# Patient Record
Sex: Male | Born: 1935 | Race: White | Hispanic: No | Marital: Married | Smoking: Former smoker
Health system: Southern US, Community
[De-identification: ages and names within clinical notes are randomized; demographics above are authoritative.]

## PROBLEM LIST (undated history)

## (undated) DIAGNOSIS — T7840XA Allergy, unspecified, initial encounter: Secondary | ICD-10-CM

## (undated) DIAGNOSIS — Z7902 Long term (current) use of antithrombotics/antiplatelets: Secondary | ICD-10-CM

## (undated) DIAGNOSIS — K5790 Diverticulosis of intestine, part unspecified, without perforation or abscess without bleeding: Secondary | ICD-10-CM

## (undated) DIAGNOSIS — I712 Thoracic aortic aneurysm, without rupture: Secondary | ICD-10-CM

## (undated) DIAGNOSIS — I700 Atherosclerosis of aorta: Secondary | ICD-10-CM

## (undated) DIAGNOSIS — D649 Anemia, unspecified: Secondary | ICD-10-CM

## (undated) DIAGNOSIS — Z87442 Personal history of urinary calculi: Secondary | ICD-10-CM

## (undated) DIAGNOSIS — N400 Enlarged prostate without lower urinary tract symptoms: Secondary | ICD-10-CM

## (undated) DIAGNOSIS — K219 Gastro-esophageal reflux disease without esophagitis: Secondary | ICD-10-CM

## (undated) DIAGNOSIS — I10 Essential (primary) hypertension: Secondary | ICD-10-CM

## (undated) DIAGNOSIS — E119 Type 2 diabetes mellitus without complications: Secondary | ICD-10-CM

## (undated) DIAGNOSIS — I4892 Unspecified atrial flutter: Secondary | ICD-10-CM

## (undated) DIAGNOSIS — N189 Chronic kidney disease, unspecified: Secondary | ICD-10-CM

## (undated) DIAGNOSIS — I739 Peripheral vascular disease, unspecified: Secondary | ICD-10-CM

## (undated) DIAGNOSIS — N183 Chronic kidney disease, stage 3 unspecified: Secondary | ICD-10-CM

## (undated) DIAGNOSIS — K56609 Unspecified intestinal obstruction, unspecified as to partial versus complete obstruction: Secondary | ICD-10-CM

## (undated) DIAGNOSIS — M1990 Unspecified osteoarthritis, unspecified site: Secondary | ICD-10-CM

## (undated) DIAGNOSIS — I2510 Atherosclerotic heart disease of native coronary artery without angina pectoris: Secondary | ICD-10-CM

## (undated) DIAGNOSIS — E785 Hyperlipidemia, unspecified: Secondary | ICD-10-CM

## (undated) DIAGNOSIS — E041 Nontoxic single thyroid nodule: Secondary | ICD-10-CM

## (undated) DIAGNOSIS — I7121 Aneurysm of the ascending aorta, without rupture: Secondary | ICD-10-CM

## (undated) DIAGNOSIS — C679 Malignant neoplasm of bladder, unspecified: Secondary | ICD-10-CM

## (undated) HISTORY — DX: Peripheral vascular disease, unspecified: I73.9

## (undated) HISTORY — DX: Atherosclerotic heart disease of native coronary artery without angina pectoris: I25.10

## (undated) HISTORY — PX: VASECTOMY: SHX75

## (undated) HISTORY — PX: SPINE SURGERY: SHX786

## (undated) HISTORY — DX: Allergy, unspecified, initial encounter: T78.40XA

## (undated) HISTORY — PX: WISDOM TOOTH EXTRACTION: SHX21

## (undated) HISTORY — DX: Hyperlipidemia, unspecified: E78.5

## (undated) HISTORY — DX: Essential (primary) hypertension: I10

## (undated) HISTORY — DX: Type 2 diabetes mellitus without complications: E11.9

## (undated) HISTORY — DX: Unspecified intestinal obstruction, unspecified as to partial versus complete obstruction: K56.609

## (undated) HISTORY — PX: TONSILLECTOMY: SUR1361

## (undated) HISTORY — PX: CAROTID STENT: SHX1301

## (undated) HISTORY — PX: APPENDECTOMY: SHX54

## (undated) HISTORY — DX: Diverticulosis of intestine, part unspecified, without perforation or abscess without bleeding: K57.90

## (undated) HISTORY — PX: COLONOSCOPY: SHX174

## (undated) HISTORY — PX: LITHOTRIPSY: SUR834

## (undated) HISTORY — DX: Unspecified osteoarthritis, unspecified site: M19.90

## (undated) HISTORY — DX: Malignant neoplasm of bladder, unspecified: C67.9

## (undated) HISTORY — PX: EYE SURGERY: SHX253

## (undated) HISTORY — DX: Gastro-esophageal reflux disease without esophagitis: K21.9

---

## 2000-10-20 HISTORY — PX: CHOLECYSTECTOMY: SHX55

## 2000-12-07 ENCOUNTER — Encounter: Payer: Self-pay | Admitting: *Deleted

## 2000-12-09 ENCOUNTER — Inpatient Hospital Stay (HOSPITAL_COMMUNITY): Admission: RE | Admit: 2000-12-09 | Discharge: 2000-12-26 | Payer: Self-pay | Admitting: *Deleted

## 2000-12-09 ENCOUNTER — Encounter (INDEPENDENT_AMBULATORY_CARE_PROVIDER_SITE_OTHER): Payer: Self-pay | Admitting: Specialist

## 2000-12-09 ENCOUNTER — Encounter: Payer: Self-pay | Admitting: Thoracic Surgery (Cardiothoracic Vascular Surgery)

## 2000-12-12 ENCOUNTER — Encounter: Payer: Self-pay | Admitting: Vascular Surgery

## 2000-12-18 ENCOUNTER — Encounter: Payer: Self-pay | Admitting: *Deleted

## 2000-12-21 ENCOUNTER — Encounter: Payer: Self-pay | Admitting: *Deleted

## 2000-12-24 ENCOUNTER — Encounter: Payer: Self-pay | Admitting: *Deleted

## 2001-01-04 ENCOUNTER — Encounter: Admission: RE | Admit: 2001-01-04 | Discharge: 2001-01-04 | Payer: Self-pay | Admitting: *Deleted

## 2001-01-04 ENCOUNTER — Encounter: Payer: Self-pay | Admitting: *Deleted

## 2001-03-22 ENCOUNTER — Encounter: Payer: Self-pay | Admitting: Surgery

## 2001-03-22 ENCOUNTER — Ambulatory Visit (HOSPITAL_COMMUNITY): Admission: RE | Admit: 2001-03-22 | Discharge: 2001-03-23 | Payer: Self-pay | Admitting: Surgery

## 2001-03-22 ENCOUNTER — Encounter (INDEPENDENT_AMBULATORY_CARE_PROVIDER_SITE_OTHER): Payer: Self-pay | Admitting: Specialist

## 2001-10-20 HISTORY — PX: OTHER SURGICAL HISTORY: SHX169

## 2007-09-22 ENCOUNTER — Ambulatory Visit: Payer: Self-pay | Admitting: Internal Medicine

## 2007-09-22 DIAGNOSIS — I70209 Unspecified atherosclerosis of native arteries of extremities, unspecified extremity: Secondary | ICD-10-CM

## 2007-09-22 DIAGNOSIS — I739 Peripheral vascular disease, unspecified: Secondary | ICD-10-CM | POA: Insufficient documentation

## 2007-09-22 DIAGNOSIS — I10 Essential (primary) hypertension: Secondary | ICD-10-CM

## 2007-09-22 DIAGNOSIS — M159 Polyosteoarthritis, unspecified: Secondary | ICD-10-CM

## 2007-09-23 LAB — CONVERTED CEMR LAB
ALT: 27 units/L (ref 0–53)
AST: 25 units/L (ref 0–37)
Albumin: 3.5 g/dL (ref 3.5–5.2)
Alkaline Phosphatase: 83 units/L (ref 39–117)
BUN: 13 mg/dL (ref 6–23)
Basophils Absolute: 0 10*3/uL (ref 0.0–0.1)
Basophils Relative: 0 % (ref 0.0–1.0)
Bilirubin, Direct: 0.1 mg/dL (ref 0.0–0.3)
CO2: 25 meq/L (ref 19–32)
Calcium: 9.5 mg/dL (ref 8.4–10.5)
Chloride: 106 meq/L (ref 96–112)
Cholesterol: 220 mg/dL (ref 0–200)
Creatinine, Ser: 1.3 mg/dL (ref 0.4–1.5)
Direct LDL: 168.7 mg/dL
Eosinophils Absolute: 0.2 10*3/uL (ref 0.0–0.6)
Eosinophils Relative: 1.9 % (ref 0.0–5.0)
GFR calc Af Amer: 70 mL/min
GFR calc non Af Amer: 58 mL/min
Glucose, Bld: 105 mg/dL — ABNORMAL HIGH (ref 70–99)
HCT: 42.8 % (ref 39.0–52.0)
HDL: 30.2 mg/dL — ABNORMAL LOW (ref >39.0)
Hemoglobin: 14.9 g/dL (ref 13.0–17.0)
Lymphocytes Relative: 25.6 % (ref 12.0–46.0)
MCHC: 34.9 g/dL (ref 30.0–36.0)
MCV: 93.4 fL (ref 78.0–100.0)
Monocytes Absolute: 0.7 10*3/uL (ref 0.2–0.7)
Monocytes Relative: 8.2 % (ref 3.0–11.0)
Neutro Abs: 5.7 10*3/uL (ref 1.4–7.7)
Neutrophils Relative %: 64.3 % (ref 43.0–77.0)
PSA: 1.02 ng/mL (ref 0.10–4.00)
Phosphorus: 2.7 mg/dL (ref 2.3–4.6)
Platelets: 197 10*3/uL (ref 150–400)
Potassium: 4.2 meq/L (ref 3.5–5.1)
RBC: 4.59 M/uL (ref 4.22–5.81)
RDW: 13.4 % (ref 11.5–14.6)
Sodium: 139 meq/L (ref 135–145)
TSH: 1.33 microintl units/mL (ref 0.35–5.50)
Total Bilirubin: 0.6 mg/dL (ref 0.3–1.2)
Total CHOL/HDL Ratio: 7.3
Total Protein: 7.1 g/dL (ref 6.0–8.3)
Triglycerides: 97 mg/dL (ref 0–149)
VLDL: 19 mg/dL (ref 0–40)
WBC: 8.9 10*3/uL (ref 4.5–10.5)

## 2007-10-26 ENCOUNTER — Ambulatory Visit: Payer: Self-pay | Admitting: Internal Medicine

## 2007-11-09 ENCOUNTER — Encounter: Payer: Self-pay | Admitting: Internal Medicine

## 2007-11-09 ENCOUNTER — Ambulatory Visit: Payer: Self-pay | Admitting: Internal Medicine

## 2007-11-09 LAB — HM COLONOSCOPY

## 2008-02-04 ENCOUNTER — Emergency Department: Payer: BC Managed Care – PPO | Admitting: Emergency Medicine

## 2008-02-04 ENCOUNTER — Other Ambulatory Visit: Payer: Self-pay

## 2009-02-09 ENCOUNTER — Ambulatory Visit: Payer: Self-pay | Admitting: Cardiovascular Disease

## 2009-02-09 ENCOUNTER — Inpatient Hospital Stay (HOSPITAL_COMMUNITY): Admission: RE | Admit: 2009-02-09 | Discharge: 2009-02-10 | Payer: Self-pay | Admitting: Cardiovascular Disease

## 2009-02-13 ENCOUNTER — Telehealth: Payer: Self-pay | Admitting: Cardiovascular Disease

## 2009-02-20 ENCOUNTER — Ambulatory Visit: Payer: Self-pay

## 2009-02-26 ENCOUNTER — Ambulatory Visit: Payer: Self-pay | Admitting: Cardiovascular Disease

## 2009-03-02 ENCOUNTER — Ambulatory Visit: Payer: Self-pay | Admitting: Internal Medicine

## 2009-03-02 DIAGNOSIS — E785 Hyperlipidemia, unspecified: Secondary | ICD-10-CM | POA: Insufficient documentation

## 2009-03-02 DIAGNOSIS — R42 Dizziness and giddiness: Secondary | ICD-10-CM | POA: Insufficient documentation

## 2009-03-23 ENCOUNTER — Ambulatory Visit: Payer: Self-pay | Admitting: Internal Medicine

## 2009-03-26 LAB — CONVERTED CEMR LAB
ALT: 31 units/L (ref 0–53)
AST: 28 units/L (ref 0–37)
Albumin: 3.4 g/dL — ABNORMAL LOW (ref 3.5–5.2)
Alkaline Phosphatase: 75 units/L (ref 39–117)
BUN: 22 mg/dL (ref 6–23)
Basophils Absolute: 0.1 10*3/uL (ref 0.0–0.1)
Basophils Relative: 0.7 % (ref 0.0–3.0)
Bilirubin, Direct: 0.2 mg/dL (ref 0.0–0.3)
CO2: 28 meq/L (ref 19–32)
Calcium: 9.1 mg/dL (ref 8.4–10.5)
Chloride: 115 meq/L — ABNORMAL HIGH (ref 96–112)
Cholesterol: 113 mg/dL (ref 0–200)
Creatinine, Ser: 1.4 mg/dL (ref 0.4–1.5)
Eosinophils Absolute: 0.3 10*3/uL (ref 0.0–0.7)
Eosinophils Relative: 3.3 % (ref 0.0–5.0)
Glucose, Bld: 125 mg/dL — ABNORMAL HIGH (ref 70–99)
HCT: 38 % — ABNORMAL LOW (ref 39.0–52.0)
HDL: 31.3 mg/dL — ABNORMAL LOW (ref >39.00)
Hemoglobin: 13.5 g/dL (ref 13.0–17.0)
LDL Cholesterol: 67 mg/dL (ref 0–99)
Lymphocytes Relative: 27.6 % (ref 12.0–46.0)
Lymphs Abs: 2.2 10*3/uL (ref 0.7–4.0)
MCHC: 35.6 g/dL (ref 30.0–36.0)
MCV: 94.7 fL (ref 78.0–100.0)
Monocytes Absolute: 0.7 10*3/uL (ref 0.1–1.0)
Monocytes Relative: 9.1 % (ref 3.0–12.0)
Neutro Abs: 4.7 10*3/uL (ref 1.4–7.7)
Neutrophils Relative %: 59.3 % (ref 43.0–77.0)
Phosphorus: 2.6 mg/dL (ref 2.3–4.6)
Platelets: 189 10*3/uL (ref 150.0–400.0)
Potassium: 4.1 meq/L (ref 3.5–5.1)
RBC: 4.01 M/uL — ABNORMAL LOW (ref 4.22–5.81)
RDW: 13 % (ref 11.5–14.6)
Sodium: 143 meq/L (ref 135–145)
TSH: 1.36 microintl units/mL (ref 0.35–5.50)
Total Bilirubin: 0.7 mg/dL (ref 0.3–1.2)
Total CHOL/HDL Ratio: 4
Total Protein: 6.6 g/dL (ref 6.0–8.3)
Triglycerides: 75 mg/dL (ref 0.0–149.0)
VLDL: 15 mg/dL (ref 0.0–40.0)
WBC: 8 10*3/uL (ref 4.5–10.5)

## 2009-05-29 ENCOUNTER — Telehealth: Payer: Self-pay | Admitting: Cardiovascular Disease

## 2009-05-30 ENCOUNTER — Ambulatory Visit: Payer: Self-pay | Admitting: Cardiovascular Disease

## 2009-05-30 DIAGNOSIS — I2510 Atherosclerotic heart disease of native coronary artery without angina pectoris: Secondary | ICD-10-CM | POA: Insufficient documentation

## 2009-09-05 ENCOUNTER — Ambulatory Visit: Payer: Self-pay | Admitting: Internal Medicine

## 2009-09-12 LAB — CONVERTED CEMR LAB
ALT: 28 units/L (ref 0–53)
AST: 28 units/L (ref 0–37)
Alkaline Phosphatase: 72 units/L (ref 39–117)
BUN: 13 mg/dL (ref 6–23)
Basophils Absolute: 0.1 10*3/uL (ref 0.0–0.1)
Basophils Relative: 0.6 % (ref 0.0–3.0)
Bilirubin, Direct: 0 mg/dL (ref 0.0–0.3)
CO2: 25 meq/L (ref 19–32)
Calcium: 9.2 mg/dL (ref 8.4–10.5)
Eosinophils Absolute: 0.2 10*3/uL (ref 0.0–0.7)
Glucose, Bld: 121 mg/dL — ABNORMAL HIGH (ref 70–99)
HDL: 32.3 mg/dL — ABNORMAL LOW (ref >39.00)
Lymphocytes Relative: 16.4 % (ref 12.0–46.0)
MCHC: 33.4 g/dL (ref 30.0–36.0)
MCV: 96.9 fL (ref 78.0–100.0)
Monocytes Absolute: 0.8 10*3/uL (ref 0.1–1.0)
Neutro Abs: 7.5 10*3/uL (ref 1.4–7.7)
Neutrophils Relative %: 72.7 % (ref 43.0–77.0)
Phosphorus: 3 mg/dL (ref 2.3–4.6)
RDW: 13.3 % (ref 11.5–14.6)
Total Bilirubin: 0.7 mg/dL (ref 0.3–1.2)
Total CHOL/HDL Ratio: 4

## 2009-12-24 ENCOUNTER — Ambulatory Visit: Payer: Self-pay | Admitting: Cardiovascular Disease

## 2010-03-08 ENCOUNTER — Ambulatory Visit: Payer: Self-pay | Admitting: Internal Medicine

## 2010-03-08 DIAGNOSIS — R7301 Impaired fasting glucose: Secondary | ICD-10-CM | POA: Insufficient documentation

## 2010-03-11 LAB — CONVERTED CEMR LAB
AST: 21 units/L (ref 0–37)
Albumin: 3.7 g/dL (ref 3.5–5.2)
Alkaline Phosphatase: 88 units/L (ref 39–117)
BUN: 18 mg/dL (ref 6–23)
Basophils Absolute: 0.1 10*3/uL (ref 0.0–0.1)
Bilirubin, Direct: 0.1 mg/dL (ref 0.0–0.3)
CO2: 28 meq/L (ref 19–32)
Chloride: 108 meq/L (ref 96–112)
Cholesterol: 137 mg/dL (ref 0–200)
Creatinine, Ser: 1.6 mg/dL — ABNORMAL HIGH (ref 0.4–1.5)
Eosinophils Absolute: 0.2 10*3/uL (ref 0.0–0.7)
Glucose, Bld: 108 mg/dL — ABNORMAL HIGH (ref 70–99)
HCT: 42.6 % (ref 39.0–52.0)
Hgb A1c MFr Bld: 6.3 % (ref 4.6–6.5)
LDL Cholesterol: 84 mg/dL (ref 0–99)
Lymphs Abs: 2.7 10*3/uL (ref 0.7–4.0)
MCHC: 34.1 g/dL (ref 30.0–36.0)
MCV: 95.6 fL (ref 78.0–100.0)
Monocytes Absolute: 0.9 10*3/uL (ref 0.1–1.0)
Neutrophils Relative %: 66.2 % (ref 43.0–77.0)
Platelets: 210 10*3/uL (ref 150.0–400.0)
RDW: 14.1 % (ref 11.5–14.6)
Total CHOL/HDL Ratio: 4
Triglycerides: 93 mg/dL (ref 0.0–149.0)

## 2010-07-15 ENCOUNTER — Ambulatory Visit: Payer: Self-pay | Admitting: Cardiovascular Disease

## 2010-08-27 ENCOUNTER — Ambulatory Visit: Payer: Self-pay | Admitting: Internal Medicine

## 2010-08-29 LAB — CONVERTED CEMR LAB
BUN: 22 mg/dL (ref 6–23)
CO2: 26 meq/L (ref 19–32)
Creatinine, Ser: 1.4 mg/dL (ref 0.4–1.5)
GFR calc non Af Amer: 50.92 mL/min (ref >60)
Glucose, Bld: 97 mg/dL (ref 70–99)
Sodium: 141 meq/L (ref 135–145)

## 2010-11-19 NOTE — Assessment & Plan Note (Signed)
Summary: 6 MONTH FOLLOW UPR/BH   Vital Signs:  Patient profile:   77 year old male Weight:      201 pounds Temp:     98.3 degrees F oral Pulse rate:   68 / minute Pulse rhythm:   regular BP sitting:   110 / 78  (left arm) Cuff size:   large  Vitals Entered By: Mervin Hack CMA Duncan Dull) (August 27, 2010 10:51 AM) CC: 6 month follow-up   History of Present Illness: Doing well Has trip to [facility] soon for convention Working close to fulltime still  Tries to stay active--no distinct work outs Does some crunches and walks Doesn't follow strict diet but has tried to increase vegetables  No chest pain No palpitations No SOB or change in exercise tolerance has not needed NTG  No sig claudication will get discomfort in legs after walking 200 yards or more  Has started OTC alkaloid--anatabine plus vitamin A & D feels this has helped inflammation in joints  Allergies: 1)  ! * Pletal 2)  Simvastatin (Simvastatin)  Past History:  Past medical, surgical, family and social histories (including risk factors) reviewed for relevance to current acute and chronic problems.  Past Medical History: Reviewed history from 12/24/2009 and no changes required. Hypertension Peripheral vascular disease s/p aortobifemoral bypass, with bilateral SFA occlusion and intermittent claudication Osteoarthritis Hyperlipidemia Small Bowel Obstruction Diverticulosis Coronary artery disease s/p PCI 2010 after presenting with exertional angina (drug-eluting stents)  Past Surgical History: Reviewed history from 12/21/2009 and no changes required. Cholecystectomy--2002 Aorto-bifem  2003  Madilyn Fireman)  Family History: Reviewed history from 12/21/2009 and no changes required. Dad died of CHF, stroke @78  Mom died @94  complications from hip fx 2 brothers 2--half brothers, 3 half sisters CAD in Dad and pat GF DM in Dad, brother No colon or prostate cancer  Social History: Reviewed history from  03/08/2010 and no changes required. Occupation: Merchant [employer] officer Former Smoker--quit around [employer], then remarried 3 sons Alcohol use-rare  Has living will--wife has health care POA Would want resuscitation attempts would accept brief trial of artificial nutrition  Review of Systems       weight is down 6# sleeps great Mood is good bowels are okay  Physical Exam  General:  alert and normal appearance.   Neck:  supple, no masses, no thyromegaly, no carotid bruits, and no cervical lymphadenopathy.   Lungs:  normal respiratory effort, no intercostal retractions, no accessory muscle use, and normal breath sounds.   Heart:  normal rate, regular rhythm, no murmur, and no gallop.   Pulses:  very faint in each foot Extremities:  no edema Psych:  normally interactive, good eye contact, not anxious appearing, and not depressed appearing.     Impression & Recommendations:  Problem # 1:  HYPERTENSION (ICD-401.9) Assessment Unchanged  good control will recheck creat  His updated medication list for this problem includes:    Metoprolol Succinate 50 Mg Xr24h-tab (Metoprolol succinate) .Marland Kitchen... Take one tablet by mouth daily    Losartan Potassium 100 Mg Tabs (Losartan potassium) .Marland Kitchen... 1 tab daily for high blood pressure  BP today: 110/78 Prior BP: 115/71 (07/15/2010)  Labs Reviewed: K+: 4.1 (03/08/2010) Creat: : 1.6 (03/08/2010)   Chol: 137 (03/08/2010)   HDL: 34.50 (03/08/2010)   LDL: 84 (03/08/2010)   TG: 93.0 (03/08/2010)  Orders: TLB-Renal Function Panel (80069-RENAL) Venipuncture (98119)  Problem # 2:  PERIPHERAL VASCULAR DISEASE (ICD-443.9) Assessment: Unchanged discussed regular walking to work on collateral circulation  His updated  medication list for this problem includes:    Plavix 75 Mg Tabs (Clopidogrel bisulfate) .Marland Kitchen... Take one tablet by mouth daily  Problem # 3:  HYPERLIPIDEMIA (ICD-272.4) Assessment: Unchanged at goal, will recheck next time  His  updated medication list for this problem includes:    Crestor 10 Mg Tabs (Rosuvastatin calcium) .Marland Kitchen... Take one tablet by mouth daily.  Labs Reviewed: SGOT: 21 (03/08/2010)   SGPT: 18 (03/08/2010)   HDL:34.50 (03/08/2010), 32.30 (09/05/2009)  LDL:84 (03/08/2010), 89 (09/05/2009)  Chol:137 (03/08/2010), 143 (09/05/2009)  Trig:93.0 (03/08/2010), 111.0 (09/05/2009)  Problem # 4:  CAD, NATIVE VESSEL (ICD-414.01) Assessment: Unchanged no recent angina  His updated medication list for this problem includes:    Metoprolol Succinate 50 Mg Xr24h-tab (Metoprolol succinate) .Marland Kitchen... Take one tablet by mouth daily    Plavix 75 Mg Tabs (Clopidogrel bisulfate) .Marland Kitchen... Take one tablet by mouth daily    Losartan Potassium 100 Mg Tabs (Losartan potassium) .Marland Kitchen... 1 tab daily for high blood pressure    Nitroglycerin 0.4 Mg Subl (Nitroglycerin) ..... One tablet under tongue every 5 minutes as needed for chest pain---may repeat times three    Aspirin 81 Mg Tbec (Aspirin) .Marland Kitchen... Take one tablet by mouth daily  Complete Medication List: 1)  Metoprolol Succinate 50 Mg Xr24h-tab (Metoprolol succinate) .... Take one tablet by mouth daily 2)  Plavix 75 Mg Tabs (Clopidogrel bisulfate) .... Take one tablet by mouth daily 3)  Crestor 10 Mg Tabs (Rosuvastatin calcium) .... Take one tablet by mouth daily. 4)  Losartan Potassium 100 Mg Tabs (Losartan potassium) .Marland Kitchen.. 1 tab daily for high blood pressure 5)  Protonix 40 Mg Tbec (Pantoprazole sodium) .... Take 1 tablet by mouth once a day as needed 6)  Nitroglycerin 0.4 Mg Subl (Nitroglycerin) .... One tablet under tongue every 5 minutes as needed for chest pain---may repeat times three 7)  Aspirin 81 Mg Tbec (Aspirin) .... Take one tablet by mouth daily 8)  Multivitamins Tabs (Multiple vitamin) .... Take 1 tablet by mouth once a day  Patient Instructions: 1)  Please schedule a follow-up appointment in 6 months .    Orders Added: 1)  TLB-Renal Function Panel [80069-RENAL] 2)   Venipuncture [36415] 3)  Est. Patient Level IV [56387]   Immunization History:  Influenza Immunization History:    Influenza:  historical (08/13/2010)   Immunization History:  Influenza Immunization History:    Influenza:  Historical (08/13/2010)  Current Allergies (reviewed today): ! * PLETAL SIMVASTATIN (SIMVASTATIN)

## 2010-11-19 NOTE — Assessment & Plan Note (Signed)
Summary: 6m f/u   Visit Type:  6 months follow up Primary Provider:  Cindee Salt MD  CC:  No cardiac complaints.  History of Present Illness: 77 year-old male who presented in April 2010 with exertional angina. He underwent cardiac cath demonstrating severe 2 vessel CAD and he underwent successful PCI with drug eluting stents to the RCA and LAD. His angina has resolved. His walking is limited by leg pain. He describes thigh and calf weakness and aching with walking. He has a history of PAD and prior aortobifemoral bypass. Leg symptoms are longstanding.   He is doing well at present with no chest pain or dyspnea. He has no orthopnea, PND, or edema.   Current Medications (verified): 1)  Bl Aspirin 325 Mg  Tabs (Aspirin) .... Take 1 Tablet By Mouth Once A Day 2)  Multivitamins   Tabs (Multiple Vitamin) .... Take 1 Tablet By Mouth Once A Day 3)  Metoprolol Succinate 50 Mg Xr24h-Tab (Metoprolol Succinate) .... Take One Tablet By Mouth Daily 4)  Plavix 75 Mg Tabs (Clopidogrel Bisulfate) .... Take One Tablet By Mouth Daily 5)  Crestor 10 Mg Tabs (Rosuvastatin Calcium) .... Take One Tablet By Mouth Daily. 6)  Nitroglycerin 0.4 Mg Subl (Nitroglycerin) .... One Tablet Under Tongue Every 5 Minutes As Needed For Chest Pain---May Repeat Times Three 7)  Losartan Potassium 100 Mg Tabs (Losartan Potassium) .Marland Kitchen.. 1 Tab Daily For High Blood Pressure 8)  Protonix 40 Mg Tbec (Pantoprazole Sodium) .... Take 1 Tablet By Mouth Once A Day  Allergies: 1)  Simvastatin (Simvastatin)  Past History:  Past medical history reviewed for relevance to current acute and chronic problems.  Past Medical History: Hypertension Peripheral vascular disease s/p aortobifemoral bypass, with bilateral SFA occlusion and intermittent claudication Osteoarthritis Hyperlipidemia Small Bowel Obstruction Diverticulosis Coronary artery disease s/p PCI 2010 after presenting with exertional angina (drug-eluting  stents)  Review of Systems       Negative except as per HPI   Vital Signs:  Patient profile:   77 year old male Height:      69.5 inches Weight:      210.50 pounds BMI:     30.75 Pulse rate:   61 / minute Pulse rhythm:   regular Resp:     18 per minute BP sitting:   138 / 84  (left arm) Cuff size:   large  Vitals Entered By: Vikki Ports (December 24, 2009 2:49 PM)  Physical Exam  General:  Pt is alert and oriented, obese male, in no acute distress. HEENT: normal Neck: normal carotid upstrokes without bruits, JVP normal Lungs: CTA CV: RRR without murmur or gallop Abd: soft, NT, positive BS, no bruit, no organomegaly Ext: no clubbing, cyanosis, or edema. peripheral pulses 2+ and equal Skin: warm and dry without rash    EKG  Procedure date:  12/24/2009  Findings:      NSR, HR 61 bpm, within normal limits  Impression & Recommendations:  Problem # 1:  CAD, NATIVE VESSEL (ICD-414.01)  Pt stable without angina, Continue DAPT with ASA and Plavix and reduce ASA dose to 81 mg daily. Continue B-blocker.  His updated medication list for this problem includes:    Bl Aspirin 325 Mg Tabs (Aspirin) .Marland Kitchen... Take 1 tablet by mouth once a day    Metoprolol Succinate 50 Mg Xr24h-tab (Metoprolol succinate) .Marland Kitchen... Take one tablet by mouth daily    Plavix 75 Mg Tabs (Clopidogrel bisulfate) .Marland Kitchen... Take one tablet by mouth daily  Nitroglycerin 0.4 Mg Subl (Nitroglycerin) ..... One tablet under tongue every 5 minutes as needed for chest pain---may repeat times three  Orders: EKG w/ Interpretation (93000)  Problem # 2:  PERIPHERAL VASCULAR DISEASE (ICD-443.9) Stable longstanding intermittent claudication, but does have limiting symptoms. Prior aortobifem bypass and known bilateral SFA occlusion. Trial of pletal 100 mg two times a day.  Problem # 3:  HYPERTENSION (ICD-401.9) BP stable on current Rx.  His updated medication list for this problem includes:    Aspirin 81 Mg Tbec  (Aspirin) .Marland Kitchen... Take one tablet by mouth daily    Metoprolol Succinate 50 Mg Xr24h-tab (Metoprolol succinate) .Marland Kitchen... Take one tablet by mouth daily    Losartan Potassium 100 Mg Tabs (Losartan potassium) .Marland Kitchen... 1 tab daily for high blood pressure  Orders: EKG w/ Interpretation (93000)  BP today: 138/84 Prior BP: 118/70 (09/05/2009)  Labs Reviewed: K+: 4.2 (09/05/2009) Creat: : 1.4 (09/05/2009)   Chol: 143 (09/05/2009)   HDL: 32.30 (09/05/2009)   LDL: 89 (09/05/2009)   TG: 111.0 (09/05/2009)  Problem # 4:  HYPERLIPIDEMIA (ICD-272.4) LDL less than 100 mg/dL on low-dose crestor.  His updated medication list for this problem includes:    Crestor 10 Mg Tabs (Rosuvastatin calcium) .Marland Kitchen... Take one tablet by mouth daily.  CHOL: 143 (09/05/2009)   LDL: 89 (09/05/2009)   HDL: 32.30 (09/05/2009)   TG: 111.0 (09/05/2009)  Patient Instructions: 1)  Your physician has recommended you make the following change in your medication: DECREASE Aspirin to 81mg  once a day, START Pletal 100mg  once a day for 1 WEEK then increase to 100mg  two times a day  2)  Your physician wants you to follow-up in:   6 MONTHS. You will receive a reminder letter in the mail two months in advance. If you don't receive a letter, please call our office to schedule the follow-up appointment. Prescriptions: PLETAL 100 MG TABS (CILOSTAZOL) take one tablet by mouth two times a day  #60 x 8   Entered by:   Julieta Gutting, RN, BSN   Authorized by:   Norva Karvonen, MD   Signed by:   Julieta Gutting, RN, BSN on 12/24/2009   Method used:   Print then Give to Patient   RxID:   [id number] JYNWGNFA POTASSIUM 100 MG TABS (LOSARTAN POTASSIUM) 1 tab daily for high blood pressure  #30 x 8   Entered by:   Julieta Gutting, RN, BSN   Authorized by:   Norva Karvonen, MD   Signed by:   Julieta Gutting, RN, BSN on 12/24/2009   Method used:   Print then Give to Patient   RxID:   [id number] CRESTOR 10 MG TABS (ROSUVASTATIN CALCIUM)  Take one tablet by mouth daily.  #30 x 8   Entered by:   Julieta Gutting, RN, BSN   Authorized by:   Norva Karvonen, MD   Signed by:   Julieta Gutting, RN, BSN on 12/24/2009   Method used:   Print then Give to Patient   RxID:   [id number] PLAVIX 75 MG TABS (CLOPIDOGREL BISULFATE) Take one tablet by mouth daily  #30 x 8   Entered by:   Julieta Gutting, RN, BSN   Authorized by:   Norva Karvonen, MD   Signed by:   Julieta Gutting, RN, BSN on 12/24/2009   Method used:   Print then Give to Patient   RxID:   [id number] METOPROLOL SUCCINATE 50 MG XR24H-TAB (METOPROLOL SUCCINATE) Take one tablet by mouth daily  #  30 x 8   Entered by:   Julieta Gutting, RN, BSN   Authorized by:   Norva Karvonen, MD   Signed by:   Julieta Gutting, RN, BSN on 12/24/2009   Method used:   Print then Give to Patient   RxID:   [id number]

## 2010-11-19 NOTE — Assessment & Plan Note (Signed)
Summary: 6 MONTH FOLLOW UP/[facility]   Vital Signs:  Patient profile:   77 year old male Weight:      207 pounds O2 Sat:      95 % on Room air Temp:     98.4 degrees F oral Pulse rate:   96 / minute Pulse rhythm:   regular BP sitting:   98 / 58  (left arm) Cuff size:   large  Vitals Entered By: Mervin Hack CMA Duncan Dull) (Mar 08, 2010 8:01 AM)  O2 Flow:  Room air CC: 6 month follow-up   History of Present Illness: DOing fairly well  has been on pletal for a couple of months believes he can walk further before he gets leg pain He does notice effects on his bowels will occ feel that he is finished but then has to go back again quickly occ loose stools No incontinence  No heart trouble No chest pain NO SOB Legs remain his limiting factor  Occ heartburn  only uses the protonix occ (will use in spurts when he gets symptoms) Occ uses mylanta and tums  Still goes into work just about every day but takes frequent vacations  Allergies: 1)  Simvastatin (Simvastatin)  Past History:  Past medical, surgical, family and social histories (including risk factors) reviewed for relevance to current acute and chronic problems.  Past Medical History: Reviewed history from 12/24/2009 and no changes required. Hypertension Peripheral vascular disease s/p aortobifemoral bypass, with bilateral SFA occlusion and intermittent claudication Osteoarthritis Hyperlipidemia Small Bowel Obstruction Diverticulosis Coronary artery disease s/p PCI 2010 after presenting with exertional angina (drug-eluting stents)  Past Surgical History: Reviewed history from 12/21/2009 and no changes required. Cholecystectomy--2002 Aorto-bifem  2003  Madilyn Fireman)  Family History: Reviewed history from 12/21/2009 and no changes required. Dad died of CHF, stroke @78  Mom died @94  complications from hip fx 2 brothers 2--half brothers, 3 half sisters CAD in Dad and pat GF DM in Dad, brother No colon or prostate  cancer  Social History: Reviewed history from 02/26/2009 and no changes required. 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   Neck:  supple, no masses, no thyromegaly, no carotid bruits, and no cervical lymphadenopathy.   Lungs:  normal respiratory effort and normal breath sounds.   Heart:  normal rate, regular rhythm, no murmur, and no gallop.   Abdomen:  soft and non-tender.   Msk:  no joint tenderness and no joint swelling.   Pulses:  no palpable pedal pulses but feet warm Extremities:  no edema Skin:  no suspicious lesions and no ulcerations.   Psych:  normally interactive, good eye contact, not anxious appearing, and not depressed appearing.     Impression & Recommendations:  Problem # 1:  HYPERTENSION (ICD-401.9) Assessment Unchanged  low today no dizziness continue current meds  His updated medication list for this problem includes:    Metoprolol Succinate 50 Mg Xr24h-tab (Metoprolol succinate) .Marland Kitchen... Take one tablet by mouth daily    Losartan Potassium 100 Mg Tabs (Losartan potassium) .Marland Kitchen... 1 tab daily for high blood pressure  BP today: 98/58 Prior BP: 138/84 (12/24/2009)  Labs Reviewed: K+: 4.2 (09/05/2009) Creat: :  1.4 (09/05/2009)   Chol: 143 (09/05/2009)   HDL: 32.30 (09/05/2009)   LDL: 89 (09/05/2009)   TG: 111.0 (09/05/2009)  Orders: TLB-Renal Function Panel (80069-RENAL) TLB-CBC Platelet - w/Differential (85025-CBCD)  Problem # 2:  CAD, NATIVE VESSEL (ICD-414.01) Assessment: Unchanged no angina improved exercise tolerance of late (due to pletal)  His updated medication list  for this problem includes:    Metoprolol Succinate 50 Mg Xr24h-tab (Metoprolol succinate) .Marland Kitchen... Take one tablet by mouth daily    Plavix 75 Mg Tabs (Clopidogrel bisulfate) .Marland Kitchen... Take one tablet by mouth daily    Losartan Potassium 100 Mg Tabs (Losartan potassium) .Marland Kitchen... 1 tab daily for high blood pressure    Pletal 100 Mg Tabs (Cilostazol) .Marland Kitchen... Take one tablet by mouth two times a day    Nitroglycerin 0.4 Mg Subl (Nitroglycerin) ..... One tablet under tongue every 5 minutes as needed for chest pain---may repeat times three    Aspirin 81 Mg Tbec (Aspirin) .Marland Kitchen... Take one tablet by mouth daily  Problem # 3:  PERIPHERAL VASCULAR DISEASE (ICD-443.9) Assessment: Improved has noted improvement mild intestinal issues but okay to continue  His updated medication list for this problem includes:    Plavix 75 Mg Tabs (Clopidogrel bisulfate) .Marland Kitchen... Take one tablet by mouth daily    Pletal 100 Mg Tabs (Cilostazol) .Marland Kitchen... Take one tablet by mouth two times a day  Problem # 4:  HYPERLIPIDEMIA (ICD-272.4) Assessment: Unchanged  will continue meds goal under 100  His updated medication list for this problem includes:    Crestor 10 Mg Tabs (Rosuvastatin calcium) .Marland Kitchen... Take one tablet by mouth daily.  Labs Reviewed: SGOT: 28 (09/05/2009)   SGPT: 28 (09/05/2009)   HDL:32.30 (09/05/2009), 31.30 (03/23/2009)  LDL:89 (09/05/2009), 67 (03/23/2009)  Chol:143 (09/05/2009), 113 (03/23/2009)  Trig:111.0 (09/05/2009), 75.0 (03/23/2009)  Orders: TLB-Lipid Panel (80061-LIPID) TLB-Hepatic/Liver Function Pnl (80076-HEPATIC) Venipuncture (16109)  Problem # 5:  IMPAIRED FASTING GLUCOSE (ICD-790.21) Assessment: Comment Only  will check A1c  Orders: TLB-A1C / Hgb A1C (Glycohemoglobin) (83036-A1C)  Complete Medication List: 1)  Metoprolol Succinate 50 Mg Xr24h-tab (Metoprolol succinate) .... Take one tablet by mouth daily 2)  Plavix 75 Mg Tabs (Clopidogrel bisulfate) .... Take one tablet by mouth daily 3)   Crestor 10 Mg Tabs (Rosuvastatin calcium) .... Take one tablet by mouth daily. 4)  Losartan Potassium 100 Mg Tabs (Losartan potassium) .Marland Kitchen.. 1 tab daily for high blood pressure 5)  Protonix 40 Mg Tbec (Pantoprazole sodium) .... Take 1 tablet by mouth once a day 6)  Pletal 100 Mg Tabs (Cilostazol) .... Take one tablet by mouth two times a day 7)  Nitroglycerin 0.4 Mg Subl (Nitroglycerin) .... One tablet under tongue every 5 minutes as needed for chest pain---may repeat times three 8)  Aspirin 81 Mg Tbec (Aspirin) .... Take one tablet by mouth daily 9)  Multivitamins Tabs (Multiple vitamin) .... Take 1 tablet by mouth once a day  Patient Instructions: 1)  Please schedule a follow-up appointment in 6 months .   Current Allergies (reviewed today): SIMVASTATIN (SIMVASTATIN)

## 2010-11-19 NOTE — Assessment & Plan Note (Signed)
Summary: f30m   Visit Type:  6 months follow up Primary Provider:  Cindee Salt MD  CC:  No cardiac complaints.  History of Present Illness: 77 year-old male who presented in April 2010 with exertional angina. He underwent cardiac cath demonstrating severe 2 vessel CAD and he underwent successful PCI with drug eluting stents to the RCA and LAD.   He denies chest pain, dyspnea, edema, orthopnea, or PND. He does not participate in regular exercise.  Had some improvement in intermittent claudication with pletal, but had to stop secondary to GI disturbance.  Reports stable to mildly improved bilateral leg claudication.  Current Medications (verified): 1)  Metoprolol Succinate 50 Mg Xr24h-Tab (Metoprolol Succinate) .... Take One Tablet By Mouth Daily 2)  Plavix 75 Mg Tabs (Clopidogrel Bisulfate) .... Take One Tablet By Mouth Daily 3)  Crestor 10 Mg Tabs (Rosuvastatin Calcium) .... Take One Tablet By Mouth Daily. 4)  Losartan Potassium 100 Mg Tabs (Losartan Potassium) .Marland Kitchen.. 1 Tab Daily For High Blood Pressure 5)  Protonix 40 Mg Tbec (Pantoprazole Sodium) .... Take 1 Tablet By Mouth Once A Day As Needed 6)  Nitroglycerin 0.4 Mg Subl (Nitroglycerin) .... One Tablet Under Tongue Every 5 Minutes As Needed For Chest Pain---May Repeat Times Three 7)  Aspirin 81 Mg Tbec (Aspirin) .... Take One Tablet By Mouth Daily 8)  Multivitamins   Tabs (Multiple Vitamin) .... Take 1 Tablet By Mouth Once A Day  Allergies: 1)  ! * Pletal 2)  Simvastatin (Simvastatin)  Past History:  Past medical history reviewed for relevance to current acute and chronic problems.  Past Medical History: Reviewed history from 12/24/2009 and no changes required. Hypertension Peripheral vascular disease s/p aortobifemoral bypass, with bilateral SFA occlusion and intermittent claudication Osteoarthritis Hyperlipidemia Small Bowel Obstruction Diverticulosis Coronary artery disease s/p PCI 2010 after presenting with  exertional angina (drug-eluting stents)  Review of Systems       Negative except as per HPI   Vital Signs:  Patient profile:   77 year old male Height:      69.5 inches Weight:      204.75 pounds BMI:     29.91 Pulse rate:   62 / minute Pulse rhythm:   regular Resp:     18 per minute BP sitting:   115 / 71  (left arm) Cuff size:   large  Vitals Entered By: Vikki Ports (July 15, 2010 12:11 PM)  Physical Exam  General:  Pt is alert and oriented, in no acute distress. HEENT: normal Neck: normal carotid upstrokes without bruits, JVP normal Lungs: CTA CV: RRR without murmur or gallop Abd: soft, NT, positive BS, no bruit, no organomegaly Ext: no clubbing, cyanosis, or edema.  Skin: warm and dry without rash    EKG  Procedure date:  07/15/2010  Findings:      NSR 62 bpm, within normal limits.  Impression & Recommendations:  Problem # 1:  CAD, NATIVE VESSEL (ICD-414.01) Pt is stable without angina. He is tolerating antiplatelet therapy with ASA and plavix.  Risk factors are well-controlled. He has lost 6 pounds since his last visit. Encouraged reinitiation of an exercise program and continued weight loss.  The following medications were removed from the medication list:    Pletal 100 Mg Tabs (Cilostazol) .Marland Kitchen... Take one tablet by mouth two times a day His updated medication list for this problem includes:    Metoprolol Succinate 50 Mg Xr24h-tab (Metoprolol succinate) .Marland Kitchen... Take one tablet by mouth daily  Plavix 75 Mg Tabs (Clopidogrel bisulfate) .Marland Kitchen... Take one tablet by mouth daily    Nitroglycerin 0.4 Mg Subl (Nitroglycerin) ..... One tablet under tongue every 5 minutes as needed for chest pain---may repeat times three    Aspirin 81 Mg Tbec (Aspirin) .Marland Kitchen... Take one tablet by mouth daily  Orders: EKG w/ Interpretation (93000)  Problem # 2:  PERIPHERAL VASCULAR DISEASE (ICD-443.9) Stable intermittent claudication. As per HPI, he is unable to tolerate pletal  secondary to GI disturbance. Continue risk reduction measures.  Problem # 3:  HYPERLIPIDEMIA (ICD-272.4) Well-controlled.  His updated medication list for this problem includes:    Crestor 10 Mg Tabs (Rosuvastatin calcium) .Marland Kitchen... Take one tablet by mouth daily.  CHOL: 137 (03/08/2010)   LDL: 84 (03/08/2010)   HDL: 34.50 (03/08/2010)   TG: 93.0 (03/08/2010)  Problem # 4:  HYPERTENSION (ICD-401.9) Controlled.  His updated medication list for this problem includes:    Metoprolol Succinate 50 Mg Xr24h-tab (Metoprolol succinate) .Marland Kitchen... Take one tablet by mouth daily    Losartan Potassium 100 Mg Tabs (Losartan potassium) .Marland Kitchen... 1 tab daily for high blood pressure    Aspirin 81 Mg Tbec (Aspirin) .Marland Kitchen... Take one tablet by mouth daily  BP today: 115/71 Prior BP: 98/58 (03/08/2010)  Labs Reviewed: K+: 4.1 (03/08/2010) Creat: : 1.6 (03/08/2010)   Chol: 137 (03/08/2010)   HDL: 34.50 (03/08/2010)   LDL: 84 (03/08/2010)   TG: 93.0 (03/08/2010)  Patient Instructions: 1)  Your physician recommends that you continue on your current medications as directed. Please refer to the Current Medication list given to you today. 2)  Your physician wants you to follow-up in: 1 YEAR.   You will receive a reminder letter in the mail two months in advance. If you don't receive a letter, please call our office to schedule the follow-up appointment.

## 2011-01-29 LAB — LIPID PANEL
LDL Cholesterol: 147 mg/dL — ABNORMAL HIGH (ref 0–99)
Triglycerides: 137 mg/dL (ref <150)

## 2011-01-29 LAB — CBC
Platelets: 165 10*3/uL (ref 150–400)
Platelets: 172 10*3/uL (ref 150–400)
RBC: 4.25 MIL/uL (ref 4.22–5.81)
RBC: 4.28 MIL/uL (ref 4.22–5.81)
WBC: 9.5 10*3/uL (ref 4.0–10.5)
WBC: 9.7 10*3/uL (ref 4.0–10.5)

## 2011-01-29 LAB — BASIC METABOLIC PANEL
BUN: 13 mg/dL (ref 6–23)
BUN: 16 mg/dL (ref 6–23)
CO2: 21 mEq/L (ref 19–32)
Calcium: 8.8 mg/dL (ref 8.4–10.5)
Chloride: 106 mEq/L (ref 96–112)
Chloride: 109 mEq/L (ref 96–112)
Creatinine, Ser: 1.18 mg/dL (ref 0.4–1.5)
Creatinine, Ser: 1.41 mg/dL (ref 0.4–1.5)
GFR calc Af Amer: 60 mL/min — ABNORMAL LOW (ref >60)
Potassium: 4.1 mEq/L (ref 3.5–5.1)

## 2011-01-29 LAB — HEMOGLOBIN A1C
Hgb A1c MFr Bld: 6 % (ref 4.6–6.1)
Mean Plasma Glucose: 126 mg/dL

## 2011-01-29 LAB — PROTIME-INR
INR: 1.1 (ref 0.00–1.49)
Prothrombin Time: 14.4 seconds (ref 11.6–15.2)

## 2011-01-29 LAB — APTT: aPTT: 32 seconds (ref 24–37)

## 2011-03-04 NOTE — Discharge Summary (Signed)
NAME:  Brandon Lewis, Brandon Lewis NO.:  [id number]   MEDICAL RECORD NO.:  [id number]          PATIENT TYPE:  INP   LOCATION:  2507                         FACILITY:  [facility]   PHYSICIAN:  Noralyn Pick. Eden Emms, MD, FACCDATE OF BIRTH:  [birth year]   DATE OF ADMISSION:  02/09/2009  DATE OF DISCHARGE:  02/10/2009                               DISCHARGE SUMMARY   PRIMARY CARDIOLOGIST:  Theron Arista C. Eden Emms, MD, [facility]   PRIMARY CARE PHYSICIAN:  Karie Schwalbe, MD   PROCEDURES PERFORMED DURING HOSPITALIZATION:  Cardiac catheterization  completed by Dr. Tonny Bollman, resulting in PCI of the distal right  coronary artery using a Xience drug-eluting stent 2.5 mm x 12-mm and a  drug-eluting stent to the mid LAD using a Xience 2.5 x 15-mm stent.   FINAL DISCHARGE DIAGNOSES:  1. Coronary artery disease.      a.     Status post cardiac catheterization revealing stenosis of       the distal right coronary artery and mid left anterior descending       coronary artery with subsequent percutaneous coronary intervention       to the distal right coronary artery and mid left anterior       descending coronary artery using a drug-eluting stent.  Please see       Dr. Earmon Phoenix thorough cardiac catheterization note for more       details.  2. Hypercholesterolemia.  3. Peripheral artery disease.      a.     Status post bilateral femoral-popliteal in 2003.  4. History of cholecystectomy.  5. History of small bowel obstruction.  6. Osteoarthritis.  7. Diverticulosis.   HOSPITAL COURSE:  This is a 77 year old Caucasian male with no known  history of CAD, but peripheral arterial disease and complaints of  intermittent claudication.  [place]  [place] to admission and began to experience exertional chest discomfort  concerning for angina.  He was originally seen by the physicians at  [facility] and was ruled out for MI by serial enzymes, and underwent  stress test and  echocardiogram.  At that time, the patient was told it  was normal and he was allowed to return home.  The patient was to follow  up with Dr. Shirlee Latch in the office for further evaluation.  However,  review of the stress Myoview revealed moderate mid to distal anterior  and anterior septal effect that was reversible and consistent with  stenosis in the mid LAD.  The patient wanted to be followed up in  [place] and left and went home to be seen by our office.  The patient  had return of heaviness and pressure in his chest with minimal exertion,  and therefore was admitted for cardiac catheterization in the setting of  abnormal stress Myoview.   The patient did undergo cardiac catheterization as stated above with PCI  to the distal RCA and mid LAD per Dr. Excell Seltzer.  Please see Dr. Earmon Phoenix  thorough cardiac catheterization note for more details as it is not  available at the time of  the dictation.  The patient tolerated the  procedure well and was without further discomfort in his chest.  He was  seen by cardiac rehab and also enrolled in the [place] registry and ADAPT-  DES study during hospitalization.   On the following morning, the patient was seen and examined by Dr. Charlton Haws and found to be stable with right groin found to be healthy and  without evidence of bleeding, hematoma, or infection.  Labs were  reviewed and the patient was ready for discharge.  The patient will  return and follow up with Dr. Tonny Bollman on discharge and have  outpatient ABIs at that time.   Discharge blood pressure 124/67, pulse 54, respirations 17, temperature  98.1.  EKG revealing sinus bradycardia with nonspecific ST abnormality  laterally and anteriorly.  The patient's ventricular rate was 53 beats  per minute.   DISCHARGE LABORATORIES:  Sodium 134, potassium 4.1, chloride 106, CO2 of  21, BUN 16, creatinine 1.4, glucose 104.  Cholesterol 196, triglycerides  137, HDL 22, LDL 147.  Hemoglobin  13.9, hematocrit 40.1, white blood  cells 9.5, platelets 165.   DISCHARGE MEDICATIONS:  1. Metoprolol 50 mg twice a day.  2. Atacand 32 mg daily.  3. Imdur 30 mg daily.  4. Aspirin 325 mg daily.  5. Plavix 75 mg daily.  6. Multivitamin daily.  7. Nitroglycerin 0.4 mg p.r.n. chest pain.  8. Crestor 10 mg 1 p.o. at bedtime (new prescription provided).   FOLLOWUP PLANS AND APPOINTMENTS:  1. The patient will follow up with Dr. Tonny Bollman in his office in      1-2 weeks.  Our office will call as this is a weekend.  2. The patient has been given post cardiac catheterization      instructions with particular emphasis on the right groin site with      evidence of bleeding, hematoma, or signs of infection.  3. The patient will also have ABIs completed on followup appointment      with Dr. Excell Seltzer for reevaluation for intermittent claudication.   Time spent with the patient to include physician time is 35 minutes.      Bettey Mare. Lyman Bishop, NP      Noralyn Pick. Eden Emms, MD, [facility]  Electronically Signed    KML/MEDQ  D:  02/10/2009  T:  02/10/2009  Job:  [id number]   cc:   Karie Schwalbe, MD

## 2011-03-04 NOTE — Assessment & Plan Note (Signed)
[facility]                                 ON-CALL NOTE   NAME:JENNINGSCalyx, Hawker                     MRN:          [id number]  DATE:02/07/2009                            DOB:          Mar 16, 1934    PRIMARY CARE PHYSICIAN:  Karie Schwalbe, MD   I was contacted today by cardiologist in [facility] about Brandon Lewis who is a patient of Dr. Tillman Abide.  The patient had been  in [facility] on vacation.  He had been having steadily increasing  exertional chest pain.  He developed pain with walking a long distance,  climbing stairs.  He had not had any pain at rest.  He did go to the  hospital at [facility] for evaluation.  Per the cardiologist in  [facility] [place], his cardiac enzymes were normal x3.  He had an  echocardiogram showing normal LV systolic function.  He was kept  overnight and then had a Lexiscan Myoview the this morning. This showed  a moderate mid-to-distal anterior and anteroseptal defect that was  completely reversible and was consistent with a stenosis in the mid LAD.  The patient had no symptoms at all at the hospital and as mentioned his  symptoms had only been exertional.  His past history included  hypertension, peripheral arterial disease.  Apparently 8 years ago, he  had a fem-pop bypass, I am not sure what leg that was in.  Per the  doctor in [facility], his creatinine was normal at their hospital.  He is a former smoker and quit back in 1994.  The patient does not want  to stay in [facility].  He plans to have his wife drive him back to  [place] today or tonight after his discharge from the hospital.  The  cardiologist in the [facility] thought that this was reasonable as he  has had no rest pain and no pain at the hospital.  We did contact the  cath lab at [facility].  We have set the patient up for heart  catheterization with likely percutaneous coronary intervention on February 09, 2009.  The  patient has been informed of this and knows to be at the  cath lab for this procedure.  Additionally, the patient is taking  aspirin and Plavix at this time.  He was started on Plavix down at  [facility].     Marca Ancona, MD  Electronically Signed    DM/MedQ  DD: 02/07/2009  DT: 02/08/2009  Job #: [id number]   cc:   Karie Schwalbe, MD

## 2011-03-04 NOTE — H&P (Signed)
NAME:  HASAAN, RADDE NO.:  [id number]   MEDICAL RECORD NO.:  [id number]          PATIENT TYPE:  INP   LOCATION:  2507                         FACILITY:  [facility]   PHYSICIAN:  Veverly Fells. Excell Seltzer, MD  DATE OF BIRTH:  [birth year]   DATE OF ADMISSION:  02/09/2009  DATE OF DISCHARGE:                              HISTORY & PHYSICAL   CARDIOLOGIST:  New, being seen by Veverly Fells. Excell Seltzer, MD   PRIMARY CARE PHYSICIAN:  Karie Schwalbe, MD, at [facility].   Mr. Magan is a 77 year old Caucasian gentleman with no known history  of coronary artery disease.  He does have a history of peripheral  vascular disease, underwent a bilateral fem-pop back in 2003 by Dr. Liliane Bade.  He continues to experience symptoms of intermittent  claudication, but has not followed up with that.  [place]. Schmader [place]  He [place] the beach for the last few  days with his wife and began to experience exertional chest discomfort  concerning for angina.  He was evaluated down at [facility] by a  cardiologist and admitted there, and he ruled out for myocardial  infarction by serial enzyme markers.  He underwent a stress Myoview and  echocardiogram.  The cardiologist down at [facility] spoke with Dr. Shirlee Latch  here as the patient was requesting to return home.  According to Dr.  Alford Highland on-call note, the echocardiogram was within normal limits.  Stress Myoview, however, showed moderate mid to distal anterior and  anterior septal effect that was reversible and consistent with stenosis  in the mid LAD.  The patient insisted on coming home and getting  followed up.  He states he has been comfortable since being home.  He  does have a return of the heaviness pressure in his chest with minimal  exertion.  He also continues to complain of pain in his legs with  walking.  His wife states that he is also complained of being dizzy when  he experiences the chest discomfort  and mildly dyspneic with exertion,  although the patient denies this.   PAST MEDICAL HISTORY:  Hypertension, peripheral arterial disease, status  post bilateral fem-pop in 2003.  The patient apparently suffered of  partial small bowel obstruction postoperatively and then ended up having  cholecystectomy also.  He has a history of osteoarthritis and  diverticulosis.  He is a former smoker.   SOCIAL HISTORY:  He lives in [place] with his wife.  He is a  [employer] officer.  He has 3 adult children, quit using tobacco in  1994.  Social EtOH use.  Denies illicit drug substances.   FAMILY HISTORY:  Mother deceased in her 69s secondary to complications  of a hip fracture.  Father deceased at age 14 with a history of CHF,  CVA, and coronary artery disease.   REVIEW OF SYSTEMS:  Positive for chest pain as described above,  intermittent claudication symptoms, and chronic arthritic pain.  All  other systems reviewed and negative.   ALLERGIES:  No known drug allergies.   MEDICATIONS:  1. Metoprolol  50 b.i.d.  2. Atacand 32.  3. Imdur 30.  4. Aspirin 325.  5. Plavix 75.  6. Multivitamin daily.   PHYSICAL EXAMINATION:  VITAL SIGNS:  Temperature 96.5, heart rate 70,  respirations 18, blood pressure 98/60, and sat 98% on room air.  GENERAL:  In no acute distress, elderly Caucasian gentleman.  HEENT:  Unremarkable.  NECK:  Supple without lymphadenopathy or JVD, questionable soft bruit on  left.  CARDIOVASCULAR:  S1 and S2.  The patient has a femoral bruit on the left  side.  LUNGS:  Clear to auscultation.  SKIN:  Warm and dry.  ABDOMEN:  Soft and nontender.  Positive bowel sounds.  LOWER EXTREMITIES:  Without clubbing, cyanosis, or edema.  Unable to  palpate dorsalis pulses, Doppler thready, Doppler posterior tibials  positive.  NEUROLOGIC:  Alert and oriented x3.  Normal effect.   Chest x-ray is pending.  EKG; sinus rhythm in 70s.  T wave inversion in  anterior leads and  lateral leads noted.  No acute ST or T wave changes.   LABORATORY WORK:  H and H 14.1 and 40, WBCs 9.7, and platelets 172,000.  Sodium 134, potassium 4, creatinine 1.18, and glucose 102.  INR 1.1.   IMPRESSION:  1. Exertional chest discomfort concerning for angina with abnormal      stress Myoview.  The patient will need a cardiac catheterization.      The risks and benefits have been discussed with the patient.  The      patient agrees to proceed.  We will continue aspirin, beta-blocker,      and ARB.  Plavix therapy was initiated down at [facility]      after the stress Myoview.  2. Hypertension, stable.  3. Peripheral vascular disease, status post bilateral fem-pop.  The      patient still with ongoing intermittent claudication, will need      workup outpatient also.  4. Questionable dyslipidemia.  Check fasting lipids here.   Dr. Tonny Bollman has been in to examine and assess the patient.   PLAN:  To proceed with cardiac catheterization today.      Dorian Pod, ACNP      Veverly Fells. Excell Seltzer, MD  Electronically Signed    MB/MEDQ  D:  02/09/2009  T:  02/09/2009  Job:  [id number]

## 2011-03-04 NOTE — Assessment & Plan Note (Signed)
[facility] OFFICE NOTE   NAME:Brandon Lewis, Brandon Lewis                   MRN:          [id number]  DATE:02/26/2009                            DOB:          11-Aug-1934    REASON FOR VISIT:  Hospital followup.   HISTORY OF PRESENT ILLNESS:  Mr. Czaja is a 77 year old gentleman  with coronary and peripheral arterial disease.  He presented in April  2010 with progressive angina.  He experienced symptoms with low-level  activity.  He underwent diagnostic catheterization that showed severe  stenosis of the distal right coronary artery and mid LAD.  He also had  moderate diffuse disease.  He was treated with drug-eluting stents in  each vessel.  He presents today for followup.  The patient feels much  better since his PCI procedure.  His chest pain is completely resolved.  His energy level is better.  He denies dyspnea, orthopnea, PND,  palpitations, lightheadedness, or syncope.   He continues to complain of bilateral calf claudication.  He has typical  symptoms with calf tightness after walking 3-4 minutes.  His symptoms  resolve with rest.  He has a history of aorto-bifemoral bypass in 2003.  His symptoms were severe, precipitating that surgery.  He had some  improvement but has had longstanding residual pain.  His symptoms are  not progressive.  He denies rest pain or ischemic ulceration.   The patient underwent a lower extremity duplex with ABIs dated Feb 20, 2009.  This demonstrated a patent AFBG with bilateral SFA occlusions,  reconstituting in the adductor canal bilaterally.  The ABIs were 0.78 on  the right and 0.66 on the left.  The patient's arm blood pressures were  noted to be low with a systolic pressure on the left arm of 82 and in  the right arm of 68.   CURRENT MEDICATIONS:  1. Metoprolol succinate 50 mg daily.  2. Atacand 32 mg daily.  3. Imdur 15 mg daily.  4. Aspirin 325 mg daily.  5. Plavix 75  mg daily.  6. Multivitamin one daily.  7. Crestor 10 mg at bedtime.   ALLERGIES:  NKDA.   PAST MEDICAL HISTORY:  Pertinent for CAD as outlined above, PAD with  previous aorto-bifem bypass and residual bilateral SFA occlusion,  cholecystectomy, hypercholesterolemia, history of small bowel  obstruction, osteoarthritis, and diverticulosis.   REVIEW OF SYSTEMS:  Pertinent positives included headaches and choosing  Imdur.  He had decreased the dose from 30-15 mg.  Otherwise, as per HPI.  No other positives to report.   EKG shows normal sinus rhythm, within normal limits.   ASSESSMENT:  1. Coronary artery disease status post stenting of the left anterior      descending and right coronary artery.  The patient is symptom-free      at present.  I recommend dual-antiplatelet therapy with aspirin and      Plavix for a minimum of 1 year.  He should continue with secondary      risk reduction measures as outlined below.  2. Peripheral arterial disease with intermittent claudication.  As  outlined above, he has bilateral superficial femoral artery      occlusion.  I recommend medical therapy and a walking program.  I      reviewed strategies for a walking program in detail with the      patient.  He does have significant limitation, however, his anatomy      as unfavorable.  I would like to get him out a little further from      his coronary intervention prior to considering revascularization.      I think, his chances for successful percutaneous treatment are low      and he likely would require fem-pop bypass.  For now, we will      continue with antiplatelet therapy and treatment of risk factors.  3. Hypercholesterolemia.  The patient is tolerating Crestor at low      dose.  He does have some muscle aches, but he says they are not too      bad.  He has several aches and pain and it is difficult to know if      any are statin related.  Followup lipids and LFTs in 3 months at      the  time of his return visit.  We will check a CK when he comes      back as well.  4. For followup, I will see the patient back in 3 months.  Handicap      parking tag form was filled out today for the patient since he is      limited by claudication.  Overall, I am very pleased with his      symptomatic improvement following coronary stenting.     Veverly Fells. Excell Seltzer, MD  Electronically Signed    MDC/MedQ  DD: 02/26/2009  DT: 02/27/2009  Job #: [id number]   cc:   Karie Schwalbe, MD

## 2011-03-07 NOTE — Discharge Summary (Signed)
[place]  Patient:    Brandon Lewis, Brandon Lewis                   MRN: [id number] Adm. Date:  [id number] Disc. Date: [id number] Attending:  Melvenia Needles Dictator:   Adair Patter, P.A. CC:         Dr. Dossie Arbour   Discharge Summary  ADMITTING DIAGNOSES: 1. Aortoiliac occlusive disease. 2. Hypertension. 3. Hypercholesterolemia. 4. History of tobacco use. 5. Postoperative ileus. 6. Hypokalemia.  DISCHARGE DIAGNOSIS:  Status post aortobifemoral bypass graft.  PROCEDURES: 1. Aortobifemoral bypass graft. 2. Postoperative ankle brachial indexes.  HOSPITAL COURSE:  [name] was admitted to [facility] on December 09, 2000, at which time he underwent an elective aortobifemoral bypass graft. This was procedure was performed by Dr. Madilyn Fireman under general endotracheal anesthesia.  Indications for this procedure were the patient having an history of aortoiliac occlusive disease with claudication.  There were no complications noted during the procedure.  Postoperative course was complicated by prolonged postoperative ileus.  This ileus was treated with NG tube suction and keeping the patient NPO.  This ileus gradually resolved without any further intervention.  Postoperatively, the patient had hypokalemia.  This was treated with potassium supplementation.  The remainder of the patients postoperative course was uneventful and he was discharged home in stable and satisfactory condition on December 26, 2000.  DISCHARGE MEDICATIONS: 1. Ultram 50 mg one to two tablets every four to six hours as needed for pain. 2. Lopressor 50 mg 1/2 tablet every 12 hours. 3. Aspirin 325 mg one tablet daily. 4. Atacand 32 mg one tablet daily.  ACTIVITY:  Avoid driving and strenuous activity.  He was told to make sure he walked daily.  DIET:  The patient was told to stay well-hydrated and resume a low fat, low salt diet.  WOUND CARE:  Clean incision with soap and  water.  He may shower.  DISPOSITION:  Discharged to home.  FOLLOWUP:  Follow up with Dr. Madilyn Fireman in the [facility] office on January 04, 2001, at 1:40 p.m. DD:  12/25/00 TD:  12/26/00 Job: [id number]

## 2011-03-07 NOTE — Op Note (Signed)
[place]  Patient:    Brandon Lewis, Brandon Lewis                   MRN: [id number] Proc. Date: 03/22/01 Adm. Date:  [id number] Disc. Date: [id number] Attending:  Melvenia Lewis CC:         Brandon Lewis, M.D.  Brandon Lewis, M.D., Brandon Lewis   Operative Report  DATE OF BIRTH:  02-11-1934  CCS NUMBER:  [phone]  PREOPERATIVE DIAGNOSIS:  Chronic cholecystitis with cholelithiasis.  POSTOPERATIVE DIAGNOSIS:  Chronic cholecystitis with cholelithiasis.  PROCEDURE:  Laparoscopic cholecystectomy with intraoperative cholangiogram.  SURGEON:  Brandon Lewis. Brandon Lewis, M.D.  ASSISTANT:  Brandon Lewis, M.D.  ANESTHESIA:  General endotracheal anesthesia.  ESTIMATED BLOOD LOSS:  Minimal.  INDICATIONS:  Brandon Lewis is a 77 year old black male who underwent an aortobifem bypass by Brandon Lewis, M.D. in February of 2002. Postoperatively he had developed some epigastric right upper quadrant pain which by ultrasound shows a thickened gallbladder wall with gallstones.  He now comes for attempt at laparoscopic cholecystectomy.  DESCRIPTION OF PROCEDURE:  The patient was placed in the supine position and given a general endotracheal anesthesia.  His abdomen was shaved, prepped with Betadine solution, and sterilely draped.  He was given 1 gram of Ancef at the initiation of the procedure.  He had PAS stockings in place, oral gastric tube in place, and an infraumbilical incision was made with sharp dissection and carried down to the abdominal cavity.  Abdominal exploration was carried out. The patient had evidence of omental attachments or adhesions to his midline. His lower pelvis was actually fairly free.  I was able to swing the scope around to the left lower quadrant where he had had some pain, but I could see no evidence of any significant scar tissue, adhesions, or mass in this area. I could visualize the right lobe of the liver, but could not see the left  lobe of the liver and the right lobe of the liver was unremarkable.  I then placed three additional trocars, a 10 mm subxiphoid Ethicon trocar, a right midsubcostal 5 mm trocar, and a right lateral 5 mm subcostal trocar.  The gallbladder was noted to have some really chronic inflammation with a kind of a greenish, bilious color to the upper 1/2 of it.  The bottom 1/2 was stuck to the duodenum fairly densely, so it was certainly consistent with him having fairly significant gallbladder disease for some perior of time.  I freed up the adhesions of the duodenum from the gallbladder sharply and bluntly.  I was able to isolate the cystic artery which I triply endoclipped and the cystic duct which I put a single clip on the gallbladder side of the cystic duct.  I did an intraoperative cholangiogram using half-strength Hi-Paque solution. I used a cutoff taut catheter inserted through a 14 gauge gelco.  The taut catheter was inserted through the side of the cystic duct and secured with an endoclip.  Again using 8 cc of half strength Hi-Paque solution under direct fluoroscopy, I was able to visualize contrast flowing down the cystic duct into the common bile duct into the duodenum.  There was no obstruction, no mass, and bile also refluxed up into the hepatic radicals.  This was felt to be a normal intraoperative cholangiogram.  The taut catheter was then removed, the cystic duct triply endoclipped and divided.  I then turned my attention to the gallbladder itself, dissecting it  sharply and bluntly through the gallbladder bed.  Again, he had fairly dense and thick scar tissue consistent with a chronic cholecystitis.  Prior to completely dividing the gallbladder from the gallbladder bed, the gallbladder bed was visualized as was the triangle of Calot.  There was no bleeding or bile leak from this area.  The gallbladder was then divided, delivered into an endocatch bag, and delivered through the  umbilicus and sent to pathology.  I then placed the 10 mm camera into the subxiphoid trocar, looked back down in the midline, again could just see only omentum in the midline stuck at the old midline scar.  Irrigated out the gallbladder bed.  Each trocar was removed under direct visualization.  The umbilical trocar was closed with a 0 Vicryl suture.  The skin at each site was closed with a 5-0 Vicryl suture, painted with tincture of Benzoin, and steri-stripped with 1/2 inch Steri-Strips and sterilely dressed.  The patient tolerated the procedure well and was transported to the recovery room in good condition.  Sponge, needle, and instrument counts were correct. D:  03/22/01 TD:  03/22/01 Job: [id number]

## 2011-03-07 NOTE — H&P (Signed)
[place]  Patient:    Brandon Lewis, Brandon Lewis                     MRN: [id number] Adm. Date:  12/09/00 Attending:  Denman George, M.D. Dictator:   Marlowe Kays, P.A. CC:         Dr. Dossie Arbour   History and Physical  DATE OF BIRTH:  [birth year]  CHIEF COMPLAINT:  AIOD.  HISTORY OF PRESENT ILLNESS:  This 77 year old white male referred by Dr. Zigmund Daniel, for evaluation of AIOD.  The patient notes that for about 18 months, he has been experiencing progressively worsening bilateral lower extremity claudication symptoms, relieved with rest.  An arteriogram revealed mild plaquing of the infrarenal abdominal aorta, occluded right external iliac artery, a 75% stenosis of the left external iliac artery, and occlusion of the left internal iliac artery.  Runoff reveals left superficial femoral artery occlusion at the adductor canal, and bilateral proximal tibial vessel disease.  An aortobifemoral bypass graft was recommended, which is scheduled for December 09, 2000.  He complains of buttock and hip, as well as thigh and calf pain.  No foot or rest pain.  No night pain.  No slow healing ulcers.  No gangrenous or ischemic changes.  No peripheral edema.  Decrease in temperature.  No shortness of breath or dyspnea on exertion after walking.  PAST MEDICAL HISTORY: 1. AIOD. 2. Ejection fraction of 59% with normal LV function. 3. Hypertension. 4. Hypercholesterolemia. 5. A remote history of tobacco abuse. 6. Decreased hearing. 7. Questionable history of sleep apnea.  PAST SURGICAL HISTORY:  Status post ureteral stent, secondary to ureteral lithiasis.  CURRENT MEDICATIONS: 1. ECASA 325 mg p.o. q.d. 2. Atacand 32 mg q.d.  ALLERGIES:  No known drug allergies.  REVIEW OF SYSTEMS:  See the HPI and past medical history for significant positive.  No diabetes mellitus, kidney disease, or asthma.  FAMILY HISTORY:  Mother died at age 62 of  complications after a hip replacement.  Father died of a CVA at age 94.  He also had a history of heart disease.  One brother alive with a history of prostate cancer.  SOCIAL HISTORY:  Married, with three children.  He is a Teacher, early years/pre.  He quit 15 years ago the use of tobacco.  He drinks alcohol on social occasions.  PHYSICAL EXAMINATION:  GENERAL:  A 77 year old white male, in no acute distress, alert and oriented x 3.  VITAL SIGNS:  Blood pressure 130/70, pulse 68, respirations 16.  HEENT:  Head normocephalic, atraumatic.  PERRLA.  EOMI.  Funduscopic examination within normal limits.  NECK:  Supple, no jugular venous distention, bruits, or lymphadenopathy.  CHEST:  Symmetrical on inspiration.  LUNGS:  Clear to auscultation bilaterally.  CARDIOVASCULAR:  A regular rate and rhythm.  No murmurs, rubs, or gallops.  ABDOMEN:  Soft, nontender.  Bowel sounds x 4.  No masses or bruits.  GENITOURINARY:  Deferred.  RECTAL:  Deferred.  EXTREMITIES:  No clubbing.  A bluish discoloration in both plantar aspects of the feet, when the feet are in a hung position.  He also has decreased temperature in both feet.  PERIPHERAL PULSES:  Carotids 2+ bilaterally, femoral 2+ on the left, with a soft bruit, 1+ on the right.  Popliteal, dorsalis pedis, and posterior tibialis nonpalpable.  NEUROLOGIC:  Nonfocal.  Normal gait.  Deep tendon reflexes 2+ bilaterally. Muscle strength 5/5.  ASSESSMENT/PLAN:  Aortic iliac occlusive disease, for aortobifemoral  bypass graft by Dr. Denman George.  Dr. Madilyn Fireman has seen and evaluated this patient prior to the admission, and has explained the risks and benefits involving the procedure, and the patient has agreed to continue. DD:  12/08/00 TD:  12/08/00 Job: [id number]

## 2011-05-06 ENCOUNTER — Encounter: Payer: Self-pay | Admitting: Cardiovascular Disease

## 2011-12-18 ENCOUNTER — Encounter: Payer: Self-pay | Admitting: Internal Medicine

## 2011-12-19 ENCOUNTER — Ambulatory Visit (INDEPENDENT_AMBULATORY_CARE_PROVIDER_SITE_OTHER): Payer: Medicare Other | Admitting: Internal Medicine

## 2011-12-19 ENCOUNTER — Encounter: Payer: Self-pay | Admitting: Internal Medicine

## 2011-12-19 VITALS — BP 128/62 | HR 68 | Temp 98.6°F | Ht 69.0 in | Wt 216.0 lb

## 2011-12-19 DIAGNOSIS — E785 Hyperlipidemia, unspecified: Secondary | ICD-10-CM | POA: Diagnosis not present

## 2011-12-19 DIAGNOSIS — R7301 Impaired fasting glucose: Secondary | ICD-10-CM

## 2011-12-19 DIAGNOSIS — I10 Essential (primary) hypertension: Secondary | ICD-10-CM | POA: Diagnosis not present

## 2011-12-19 DIAGNOSIS — I2510 Atherosclerotic heart disease of native coronary artery without angina pectoris: Secondary | ICD-10-CM

## 2011-12-19 DIAGNOSIS — R6889 Other general symptoms and signs: Secondary | ICD-10-CM

## 2011-12-19 DIAGNOSIS — K219 Gastro-esophageal reflux disease without esophagitis: Secondary | ICD-10-CM | POA: Insufficient documentation

## 2011-12-19 LAB — BASIC METABOLIC PANEL
CO2: 27 mEq/L (ref 19–32)
Calcium: 9 mg/dL (ref 8.4–10.5)
Creatinine, Ser: 1.6 mg/dL — ABNORMAL HIGH (ref 0.4–1.5)
GFR: 45.92 mL/min — ABNORMAL LOW (ref >60.00)
Glucose, Bld: 126 mg/dL — ABNORMAL HIGH (ref 70–99)
Sodium: 140 mEq/L (ref 135–145)

## 2011-12-19 LAB — HEPATIC FUNCTION PANEL
Albumin: 3.8 g/dL (ref 3.5–5.2)
Alkaline Phosphatase: 61 U/L (ref 39–117)
Bilirubin, Direct: 0 mg/dL (ref 0.0–0.3)
Total Bilirubin: 0.5 mg/dL (ref 0.3–1.2)
Total Protein: 7 g/dL (ref 6.0–8.3)

## 2011-12-19 LAB — TSH: TSH: 1.81 u[IU]/mL (ref 0.35–5.50)

## 2011-12-19 LAB — CBC WITH DIFFERENTIAL/PLATELET
Basophils Absolute: 0 10*3/uL (ref 0.0–0.1)
Eosinophils Absolute: 0.3 10*3/uL (ref 0.0–0.7)
Hemoglobin: 14 g/dL (ref 13.0–17.0)
Lymphocytes Relative: 24.7 % (ref 12.0–46.0)
MCHC: 33.2 g/dL (ref 30.0–36.0)
Monocytes Relative: 7.4 % (ref 3.0–12.0)
Neutro Abs: 6.8 10*3/uL (ref 1.4–7.7)
Neutrophils Relative %: 64.8 % (ref 43.0–77.0)
Platelets: 184 10*3/uL (ref 150.0–400.0)
RDW: 14 % (ref 11.5–14.6)

## 2011-12-19 LAB — LIPID PANEL
HDL: 37.9 mg/dL — ABNORMAL LOW (ref >39.00)
Triglycerides: 144 mg/dL (ref 0.0–149.0)
VLDL: 28.8 mg/dL (ref 0.0–40.0)

## 2011-12-19 LAB — HEMOGLOBIN A1C: Hgb A1c MFr Bld: 6.8 % — ABNORMAL HIGH (ref 4.6–6.5)

## 2011-12-19 MED ORDER — CLOPIDOGREL BISULFATE 75 MG PO TABS: 75.0000 mg | ORAL_TABLET | Freq: Every day | ORAL | Status: DC

## 2011-12-19 MED ORDER — ROSUVASTATIN CALCIUM 10 MG PO TABS: 10.0000 mg | ORAL_TABLET | Freq: Every day | ORAL | Status: DC

## 2011-12-19 MED ORDER — METOPROLOL SUCCINATE ER 50 MG PO TB24: 50.0000 mg | ORAL_TABLET | Freq: Every day | ORAL | Status: DC

## 2011-12-19 MED ORDER — NITROGLYCERIN 0.4 MG SL SUBL: 0.4000 mg | SUBLINGUAL_TABLET | SUBLINGUAL | Status: DC | PRN

## 2011-12-19 MED ORDER — LOSARTAN POTASSIUM 100 MG PO TABS: 100.0000 mg | ORAL_TABLET | Freq: Every day | ORAL | Status: DC

## 2011-12-19 MED ORDER — PANTOPRAZOLE SODIUM 40 MG PO TBEC: 40.0000 mg | DELAYED_RELEASE_TABLET | Freq: Every day | ORAL | Status: DC | PRN

## 2011-12-19 NOTE — Assessment & Plan Note (Signed)
Has sense of congestion or something stuck there ?slight voice changes Will set up ENT eval

## 2011-12-19 NOTE — Progress Notes (Signed)
Subjective:    Patient ID: Brandon Lewis, male    DOB: 07-May-1934, 78 y.o.   MRN: [id number]  HPI Has not been in for a while Gained 15# since past visit Has let things go a bit  Has had a URI type symptoms for some time--3-4 months Sensation of congestion in throat that goes back some time Wonders about reflux---is still on the protonix (or other PPI) No swallowing problems No sig voice changes--maybe a little Has sense of congestion in right ear No regular cough No SOB Ex-smoker-- 50 years total as smoker though  No chest pain No palpitations Tries to walk some--but not much Has been working [employer] is sick  Current Outpatient Prescriptions on File Prior to Visit  Medication Sig Dispense Refill  . aspirin 81 MG EC tablet Take 81 mg by mouth daily.        . Multiple Vitamin (MULTIVITAMIN) tablet Take 1 tablet by mouth daily.          Allergies  Allergen Reactions  . Cilostazol     REACTION: stomach problems- constipation  . Simvastatin     REACTION: myalgia    Past Medical History  Diagnosis Date  . HTN (hypertension)   . PVD (peripheral vascular disease)     s/p aortobifemoral bypass, with bilateral SFA occlusion and intermittent claudication   . Osteoarthritis   . Small bowel obstruction   . Diverticulosis   . CAD (coronary artery disease)     s/p PCI after presenting with exertional angina (drug eluting stent)  . Hyperlipidemia     Past Surgical History  Procedure Date  . Cholecystectomy 2002  . Aorto-bifem 2003    hayes     Family History  Problem Relation Age of Onset  . Coronary artery disease Paternal Grandfather   . Diabetes Father     DM - and brother   . Colon cancer Neg Hx   . Prostate cancer Neg Hx     History   Social History  . Marital Status: Married    Spouse Name: N/A    Number of Children: 3  . Years of Education: N/A   Occupational History  . consultant pharmacist    Social History Main Topics  . Smoking  status: Former Smoker    Types: Cigarettes    Quit date: 10/20/1992  . Smokeless tobacco: Never Used   Comment: quit in 1994   . Alcohol Use: Yes     rare  . Drug Use: No  . Sexually Active: Not on file   Other Topics Concern  . Not on file   Social History Narrative   Widowed; then remarried; 3 sons.Consultant pharmacist. Has a living will - wife has health care POA Would want resuscitation attemptsWould accept brief trial of artificial nutritionPt signed designated party release form and gives Brandon Lewis [phone] (home #), access to medical records. Can also leave msg on home answering machine. Cell # [phone]   Review of Systems Sleeps well Bowels are fine Has noticed increased urinary frequency. Some dribbling      Objective:   Physical Exam  Constitutional: He appears well-developed and well-nourished. No distress.  HENT:  Right Ear: External ear normal.  Left Ear: External ear normal.  Mouth/Throat: Oropharynx is clear and moist. No oropharyngeal exudate.       Right TM normal Left obscured with cerumen  Neck: Normal range of motion. Neck supple. No thyromegaly present.  Cardiovascular: Normal rate, regular rhythm and normal  heart sounds.  Exam reveals no gallop.   No murmur heard.      Faint distal pulses  Pulmonary/Chest: Effort normal and breath sounds normal. No respiratory distress. He has no wheezes. He has no rales.  Abdominal: Soft. There is no tenderness.  Musculoskeletal: He exhibits no edema and no tenderness.  Lymphadenopathy:    He has no cervical adenopathy.  Psychiatric: He has a normal mood and affect. His behavior is normal. Thought content normal.          Assessment & Plan:

## 2011-12-19 NOTE — Assessment & Plan Note (Signed)
No problems with the med Due for labs 

## 2011-12-19 NOTE — Assessment & Plan Note (Signed)
Has been quiet but he is not keeping up with fitness Due to see Dr Excell Seltzer soon

## 2011-12-19 NOTE — Assessment & Plan Note (Signed)
BP Readings from Last 3 Encounters:  12/19/11 128/62  08/27/10 110/78  07/15/10 115/71   Good control Due for labs

## 2011-12-19 NOTE — Assessment & Plan Note (Signed)
Probably the reason for his throat symptoms Continue the PPI

## 2011-12-22 DIAGNOSIS — J310 Chronic rhinitis: Secondary | ICD-10-CM | POA: Diagnosis not present

## 2011-12-22 DIAGNOSIS — R070 Pain in throat: Secondary | ICD-10-CM | POA: Diagnosis not present

## 2011-12-22 DIAGNOSIS — J039 Acute tonsillitis, unspecified: Secondary | ICD-10-CM | POA: Diagnosis not present

## 2011-12-22 DIAGNOSIS — J060 Acute laryngopharyngitis: Secondary | ICD-10-CM | POA: Diagnosis not present

## 2011-12-26 ENCOUNTER — Encounter: Payer: Self-pay | Admitting: *Deleted

## 2012-01-12 DIAGNOSIS — J060 Acute laryngopharyngitis: Secondary | ICD-10-CM | POA: Diagnosis not present

## 2012-01-12 DIAGNOSIS — J310 Chronic rhinitis: Secondary | ICD-10-CM | POA: Diagnosis not present

## 2012-06-25 ENCOUNTER — Encounter: Payer: Self-pay | Admitting: Internal Medicine

## 2012-06-25 ENCOUNTER — Ambulatory Visit (INDEPENDENT_AMBULATORY_CARE_PROVIDER_SITE_OTHER): Payer: Medicare Other | Admitting: Internal Medicine

## 2012-06-25 VITALS — BP 110/68 | HR 52 | Temp 97.7°F | Ht 69.0 in | Wt 207.0 lb

## 2012-06-25 DIAGNOSIS — Z0000 Encounter for general adult medical examination without abnormal findings: Secondary | ICD-10-CM | POA: Diagnosis not present

## 2012-06-25 DIAGNOSIS — I2510 Atherosclerotic heart disease of native coronary artery without angina pectoris: Secondary | ICD-10-CM | POA: Diagnosis not present

## 2012-06-25 DIAGNOSIS — R7301 Impaired fasting glucose: Secondary | ICD-10-CM | POA: Diagnosis not present

## 2012-06-25 DIAGNOSIS — E785 Hyperlipidemia, unspecified: Secondary | ICD-10-CM

## 2012-06-25 DIAGNOSIS — I10 Essential (primary) hypertension: Secondary | ICD-10-CM | POA: Diagnosis not present

## 2012-06-25 DIAGNOSIS — K219 Gastro-esophageal reflux disease without esophagitis: Secondary | ICD-10-CM

## 2012-06-25 LAB — BASIC METABOLIC PANEL
Calcium: 9.8 mg/dL (ref 8.4–10.5)
GFR: 57.52 mL/min — ABNORMAL LOW (ref >60.00)
Sodium: 140 mEq/L (ref 135–145)

## 2012-06-25 NOTE — Assessment & Plan Note (Signed)
Has lost some weight  Will recheck labs

## 2012-06-25 NOTE — Assessment & Plan Note (Signed)
Lab Results  Component Value Date   LDLCALC 81 12/19/2011   Good control No side effects with statin

## 2012-06-25 NOTE — Assessment & Plan Note (Signed)
BP Readings from Last 3 Encounters:  06/25/12 110/68  12/19/11 128/62  08/27/10 110/78   Good control No changes needed

## 2012-06-25 NOTE — Assessment & Plan Note (Signed)
I have personally reviewed the Medicare Annual Wellness questionnaire and have noted 1. The patient's medical and social history 2. Their use of alcohol, tobacco or illicit drugs 3. Their current medications and supplements 4. The patient's functional ability including ADL's, fall risks, home safety risks and hearing or visual             impairment. 5. Diet and physical activities 6. Evidence for depression or mood disorders  The patients weight, height, BMI and visual acuity have been recorded in the chart I have made referrals, counseling and provided education to the patient based review of the above and I have provided the pt with a written personalized care plan for preventive services.  I have provided you with a copy of your personalized plan for preventive services. Please take the time to review along with your updated medication list.  Just discussed fitness

## 2012-06-25 NOTE — Progress Notes (Signed)
Subjective:    Patient ID: Brandon Lewis, male    DOB: Nov 08, 1933, 78 y.o.   MRN: [id number]  HPI Here for Medicare Wellness visit and follow up Reviewed other physicians Uses hearing aides---vision fine Independent with ADLs and instrumental ADLs Non smoker Has been trying to do elliptical trainer and yard work---has lost some weight since last time Still works fairly full time but is cutting back No falls Reviewed advanced directives  Feels his stamina has increased---better muscle strength No chest pain  No SOB No edema No dizziness or syncope  Throat problems are better Went to Dr Willeen Cass and got flonase and antibiotics Resolved now  Feels his reflux is controlled Remains on the protonix No sig cough  Current Outpatient Prescriptions on File Prior to Visit  Medication Sig Dispense Refill  . aspirin 81 MG EC tablet Take 81 mg by mouth daily.        . clopidogrel (PLAVIX) 75 MG tablet Take 1 tablet (75 mg total) by mouth daily.  30 tablet  11  . fluticasone (FLONASE) 50 MCG/ACT nasal spray Place 2 sprays into the nose daily.      Marland Kitchen losartan (COZAAR) 100 MG tablet Take 1 tablet (100 mg total) by mouth daily.  30 tablet  11  . metoprolol succinate (TOPROL-XL) 50 MG 24 hr tablet Take 1 tablet (50 mg total) by mouth daily.  30 tablet  11  . Multiple Vitamin (MULTIVITAMIN) tablet Take 1 tablet by mouth daily.        . nitroGLYCERIN (NITROSTAT) 0.4 MG SL tablet Place 1 tablet (0.4 mg total) under the tongue every 5 (five) minutes as needed.  25 tablet  1  . pantoprazole (PROTONIX) 40 MG tablet Take 1 tablet (40 mg total) by mouth daily as needed.  30 tablet  11  . rosuvastatin (CRESTOR) 10 MG tablet Take 1 tablet (10 mg total) by mouth daily.  30 tablet  11    Allergies  Allergen Reactions  . Cilostazol     REACTION: stomach problems- constipation  . Simvastatin     REACTION: myalgia    Past Medical History  Diagnosis Date  . HTN (hypertension)   . PVD  (peripheral vascular disease)     s/p aortobifemoral bypass, with bilateral SFA occlusion and intermittent claudication   . Osteoarthritis   . Small bowel obstruction   . Diverticulosis   . CAD (coronary artery disease)     s/p PCI after presenting with exertional angina (drug eluting stent)  . Hyperlipidemia   . GERD (gastroesophageal reflux disease)     Past Surgical History  Procedure Date  . Cholecystectomy 2002  . Aorto-bifem 2003    hayes     Family History  Problem Relation Age of Onset  . Coronary artery disease Paternal Grandfather   . Diabetes Father     DM - and brother   . Colon cancer Neg Hx   . Prostate cancer Neg Hx     History   Social History  . Marital Status: Married    Spouse Name: N/A    Number of Children: 3  . Years of Education: N/A   Occupational History  . consultant pharmacist    Social History Main Topics  . Smoking status: Former Smoker    Types: Cigarettes    Quit date: 10/20/1992  . Smokeless tobacco: Never Used   Comment: quit in 1994   . Alcohol Use: Yes     rare  . Drug Use:  No  . Sexually Active: Not on file   Other Topics Concern  . 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 Can also leave msg on home answering machine. Cell # [phone]   Review of Systems Voids okay. Nocturia x 1-2  Appetite is fine Sleeps well    Objective:   Physical Exam  Constitutional: He is oriented to person, place, and time. He appears well-developed and well-nourished. No distress.  Neck: Normal range of motion. Neck supple. No thyromegaly present.  Cardiovascular: Normal rate, regular rhythm, normal heart sounds and intact distal pulses.  Exam reveals no  gallop.   No murmur heard.      Faint distal pulses  Pulmonary/Chest: Effort normal and breath sounds normal. No respiratory distress. He has no wheezes. He has no rales.  Abdominal: Soft. There is no tenderness.  Musculoskeletal: He exhibits no edema and no tenderness.  Lymphadenopathy:    He has no cervical adenopathy.  Neurological: He is alert and oriented to person, place, and time.       President-- "Obama, Renne Musca---" couldn't get [place] [phone]-...."not good with figures" D-l-r-o-w Recall 2/3  Skin: No rash noted. No erythema.  Psychiatric: He has a normal mood and affect. His behavior is normal. Thought content normal.          Assessment & Plan:

## 2012-06-25 NOTE — Assessment & Plan Note (Signed)
Satisfied with protonix

## 2012-06-25 NOTE — Assessment & Plan Note (Signed)
Seems to be quiet Has actually improved fitness---discussed ongoing efforts No med changes needed--- ARB, statin, beta blocker, plavix

## 2012-06-29 ENCOUNTER — Encounter: Payer: Self-pay | Admitting: *Deleted

## 2012-10-27 ENCOUNTER — Encounter: Payer: Self-pay | Admitting: Internal Medicine

## 2012-12-22 ENCOUNTER — Ambulatory Visit: Payer: Medicare Other | Admitting: Internal Medicine

## 2013-01-12 ENCOUNTER — Encounter: Payer: Self-pay | Admitting: Internal Medicine

## 2013-01-12 ENCOUNTER — Ambulatory Visit (INDEPENDENT_AMBULATORY_CARE_PROVIDER_SITE_OTHER): Payer: Medicare Other | Admitting: Internal Medicine

## 2013-01-12 VITALS — BP 132/68 | HR 64 | Temp 97.9°F | Wt 212.0 lb

## 2013-01-12 DIAGNOSIS — I10 Essential (primary) hypertension: Secondary | ICD-10-CM | POA: Diagnosis not present

## 2013-01-12 DIAGNOSIS — I2510 Atherosclerotic heart disease of native coronary artery without angina pectoris: Secondary | ICD-10-CM

## 2013-01-12 DIAGNOSIS — E785 Hyperlipidemia, unspecified: Secondary | ICD-10-CM

## 2013-01-12 DIAGNOSIS — K219 Gastro-esophageal reflux disease without esophagitis: Secondary | ICD-10-CM

## 2013-01-12 DIAGNOSIS — M1990 Unspecified osteoarthritis, unspecified site: Secondary | ICD-10-CM

## 2013-01-12 LAB — LIPID PANEL
Cholesterol: 135 mg/dL (ref 0–200)
Total CHOL/HDL Ratio: 4
Triglycerides: 129 mg/dL (ref 0.0–149.0)

## 2013-01-12 MED ORDER — LOSARTAN POTASSIUM 100 MG PO TABS: 100.0000 mg | ORAL_TABLET | Freq: Every day | ORAL | Status: DC

## 2013-01-12 MED ORDER — METOPROLOL SUCCINATE ER 50 MG PO TB24: 50.0000 mg | ORAL_TABLET | Freq: Every day | ORAL | Status: DC

## 2013-01-12 MED ORDER — ROSUVASTATIN CALCIUM 10 MG PO TABS: 10.0000 mg | ORAL_TABLET | Freq: Every day | ORAL | Status: DC

## 2013-01-12 MED ORDER — CLOPIDOGREL BISULFATE 75 MG PO TABS: 75.0000 mg | ORAL_TABLET | Freq: Every day | ORAL | Status: DC

## 2013-01-12 MED ORDER — PANTOPRAZOLE SODIUM 40 MG PO TBEC: 40.0000 mg | DELAYED_RELEASE_TABLET | Freq: Every day | ORAL | Status: DC | PRN

## 2013-01-12 MED ORDER — NITROGLYCERIN 0.4 MG SL SUBL: 0.4000 mg | SUBLINGUAL_TABLET | SUBLINGUAL | Status: DC | PRN

## 2013-01-12 NOTE — Assessment & Plan Note (Signed)
Intermittent pain---now uses aleve prn

## 2013-01-12 NOTE — Assessment & Plan Note (Signed)
Tolerates statin Due for labs

## 2013-01-12 NOTE — Assessment & Plan Note (Signed)
Only needs PPI once in a while

## 2013-01-12 NOTE — Assessment & Plan Note (Signed)
Has been quiet No changes needed Needs to work on fitness more--esp in winter

## 2013-01-12 NOTE — Assessment & Plan Note (Signed)
BP Readings from Last 3 Encounters:  01/12/13 132/68  06/25/12 110/68  12/19/11 128/62   Good control No changes needed

## 2013-01-12 NOTE — Progress Notes (Signed)
Subjective:    Patient ID: Brandon Lewis, male    DOB: [birth year], 79 y.o.   MRN: [id number]  HPI Here for follow up Frustrated that he gained back weight---feels that just started Back to working in yard Discussed gym (Y) for winter  No chest pain No SOB but does have stable DOE Concerned about sleep apnea---if sleeping in chair, he awakens needing deep breath. Does okay supine. Wife doesn't notice apnea or gasping Awakens refreshed No dizziness or syncope No sig edema  Stomach is fine Only uses the PPI occasionally  celebrex is only used once in a while Mostly uses OTC naproxen--but even then only when works hard out in the yard (2/week or so)  Current Outpatient Prescriptions on File Prior to Visit  Medication Sig Dispense Refill  . aspirin 81 MG EC tablet Take 81 mg by mouth daily.        . fluticasone (FLONASE) 50 MCG/ACT nasal spray Place 2 sprays into the nose daily.      . Multiple Vitamin (MULTIVITAMIN) tablet Take 1 tablet by mouth daily.         No current facility-administered medications on file prior to visit.    Allergies  Allergen Reactions  . Cilostazol     REACTION: stomach problems- constipation  . Simvastatin     REACTION: myalgia    Past Medical History  Diagnosis Date  . HTN (hypertension)   . PVD (peripheral vascular disease)     s/p aortobifemoral bypass, with bilateral SFA occlusion and intermittent claudication   . Osteoarthritis   . Small bowel obstruction   . Diverticulosis   . CAD (coronary artery disease)     s/p PCI after presenting with exertional angina (drug eluting stent)  . Hyperlipidemia   . GERD (gastroesophageal reflux disease)     Past Surgical History  Procedure Laterality Date  . Cholecystectomy  2002  . Aorto-bifem  2003    hayes     Family History  Problem Relation Age of Onset  . Coronary artery disease Paternal Grandfather   . Diabetes Father     DM - and brother   . Colon cancer Neg Hx   . Prostate  cancer Neg Hx     History   Social History  . Marital Status: Married    Spouse Name: N/A    Number of Children: 3  . Years of Education: N/A   Occupational History  . 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 Smoking status: Former Smoker    Types: Cigarettes    Quit date: 10/20/1992  . Smokeless tobacco: Never Used     Comment: quit in 1994   . Alcohol Use: Yes     Comment: rare  . Drug Use: No  . Sexually Active: Not on file   Other Topics Concern  . Not on file   Social History Narrative   Widowed; then remarried; 3 sons.   Consultant pharmacist.       Has a living will - wife has health care POA    Would want resuscitation attempts   Would accept brief trial of artificial nutrition      Pt signed designated party release form and gives Brandon Lewis [phone] (home #), access to medical records. Can also leave msg on home answering machine. Cell # [phone]   Review of Systems Generally voids okay but some trouble emptying---may have to go again  in 30 minutes. Nocturia x 2 usually Appetite is fine Weight up 5# Recurring oral mucosal cyst--reassured no action needed (appears benign) Occasional blood tinged mucus from nose--discussed using vaseline     Objective:   Physical Exam  Constitutional: He appears well-developed and well-nourished. No distress.  HENT:  Small cyst on inside buccal mucosa No sig nasal lesions  Neck: Normal range of motion. Neck supple.  Cardiovascular: Normal rate, regular rhythm and normal heart sounds.  Exam reveals no gallop.   No murmur heard. Pulmonary/Chest: Effort normal and breath sounds normal. No respiratory distress. He has no wheezes. He has no rales.  Abdominal: Soft. There is no tenderness.  Musculoskeletal: He exhibits no edema and no tenderness.  Lymphadenopathy:    He has no cervical adenopathy.  Psychiatric: He has a normal mood and affect. His behavior is  normal.          Assessment & Plan:

## 2013-01-14 ENCOUNTER — Encounter: Payer: Self-pay | Admitting: *Deleted

## 2013-06-15 ENCOUNTER — Encounter: Payer: Self-pay | Admitting: Internal Medicine

## 2013-07-19 ENCOUNTER — Encounter: Payer: Self-pay | Admitting: Internal Medicine

## 2013-07-19 ENCOUNTER — Ambulatory Visit (INDEPENDENT_AMBULATORY_CARE_PROVIDER_SITE_OTHER): Payer: Medicare Other | Admitting: Internal Medicine

## 2013-07-19 VITALS — BP 130/80 | HR 63 | Temp 98.6°F | Ht 69.0 in | Wt 204.0 lb

## 2013-07-19 DIAGNOSIS — E785 Hyperlipidemia, unspecified: Secondary | ICD-10-CM | POA: Diagnosis not present

## 2013-07-19 DIAGNOSIS — I2510 Atherosclerotic heart disease of native coronary artery without angina pectoris: Secondary | ICD-10-CM | POA: Diagnosis not present

## 2013-07-19 DIAGNOSIS — M23302 Other meniscus derangements, unspecified lateral meniscus, unspecified knee: Secondary | ICD-10-CM

## 2013-07-19 DIAGNOSIS — Z0000 Encounter for general adult medical examination without abnormal findings: Secondary | ICD-10-CM | POA: Diagnosis not present

## 2013-07-19 DIAGNOSIS — M23307 Other meniscus derangements, unspecified meniscus, left knee: Secondary | ICD-10-CM | POA: Insufficient documentation

## 2013-07-19 DIAGNOSIS — R7301 Impaired fasting glucose: Secondary | ICD-10-CM

## 2013-07-19 DIAGNOSIS — I10 Essential (primary) hypertension: Secondary | ICD-10-CM | POA: Diagnosis not present

## 2013-07-19 DIAGNOSIS — Z23 Encounter for immunization: Secondary | ICD-10-CM | POA: Diagnosis not present

## 2013-07-19 LAB — CBC WITH DIFFERENTIAL/PLATELET
Basophils Absolute: 0.1 10*3/uL (ref 0.0–0.1)
Eosinophils Absolute: 0.2 10*3/uL (ref 0.0–0.7)
Eosinophils Relative: 1.9 % (ref 0.0–5.0)
HCT: 41.8 % (ref 39.0–52.0)
Hemoglobin: 14.1 g/dL (ref 13.0–17.0)
Lymphocytes Relative: 32.1 % (ref 12.0–46.0)
Monocytes Relative: 7.6 % (ref 3.0–12.0)
Neutro Abs: 5.1 10*3/uL (ref 1.4–7.7)
Neutrophils Relative %: 57.8 % (ref 43.0–77.0)
Platelets: 199 10*3/uL (ref 150.0–400.0)
WBC: 8.8 10*3/uL (ref 4.5–10.5)

## 2013-07-19 LAB — HEPATIC FUNCTION PANEL
AST: 25 U/L (ref 0–37)
Alkaline Phosphatase: 68 U/L (ref 39–117)
Bilirubin, Direct: 0.1 mg/dL (ref 0.0–0.3)
Total Bilirubin: 0.7 mg/dL (ref 0.3–1.2)

## 2013-07-19 LAB — BASIC METABOLIC PANEL
BUN: 26 mg/dL — ABNORMAL HIGH (ref 6–23)
CO2: 21 mEq/L (ref 19–32)
Calcium: 9.6 mg/dL (ref 8.4–10.5)
Creatinine, Ser: 1.4 mg/dL (ref 0.4–1.5)
GFR: 50.52 mL/min — ABNORMAL LOW (ref >60.00)
Potassium: 4.5 mEq/L (ref 3.5–5.1)

## 2013-07-19 LAB — TSH: TSH: 1.38 u[IU]/mL (ref 0.35–5.50)

## 2013-07-19 LAB — LIPID PANEL
Cholesterol: 125 mg/dL (ref 0–200)
HDL: 36.6 mg/dL — ABNORMAL LOW (ref >39.00)
LDL Cholesterol: 69 mg/dL (ref 0–99)
VLDL: 19.8 mg/dL (ref 0.0–40.0)

## 2013-07-19 LAB — HEMOGLOBIN A1C: Hgb A1c MFr Bld: 6.2 % (ref 4.6–6.5)

## 2013-07-19 MED ORDER — HYDROCODONE-ACETAMINOPHEN 5-325 MG PO TABS: 1.0000 | ORAL_TABLET | Freq: Three times a day (TID) | ORAL | Status: DC | PRN

## 2013-07-19 NOTE — Progress Notes (Signed)
Subjective:    Patient ID: Brandon Lewis, male    DOB: November 06, 1933, 79 y.o.   MRN: [id number]  HPI Here for Medicare Wellness visit and follow up Occasional alcohol No tobacco products Monogamous with wife Independent with instrumental ADLs No falls No depression or anhedonia Doesn't see other doctors now--brother is optometrist Has hearing aides. Vision is okay  Has lost the weight he gained Dedicated to regular exercise this winter No heart problems No chest pain No SOB---happy with his exercise tolerance No palpitations No dizziness or syncope. Will have rare vertigo  Stomach has been fine Only uses the protonix for flares---rarely uses  Having arthritic problems still Left knee is the worst Uses the naproxen when it flares Pain worse when lying down Tried capsaicin-- had some trouble with it  Current Outpatient Prescriptions on File Prior to Visit  Medication Sig Dispense Refill  . aspirin 81 MG EC tablet Take 81 mg by mouth daily.        . clopidogrel (PLAVIX) 75 MG tablet Take 1 tablet (75 mg total) by mouth daily.  30 tablet  11  . fluticasone (FLONASE) 50 MCG/ACT nasal spray Place 2 sprays into the nose daily.      Marland Kitchen losartan (COZAAR) 100 MG tablet Take 1 tablet (100 mg total) by mouth daily.  30 tablet  11  . metoprolol succinate (TOPROL-XL) 50 MG 24 hr tablet Take 1 tablet (50 mg total) by mouth daily.  30 tablet  11  . Multiple Vitamin (MULTIVITAMIN) tablet Take 1 tablet by mouth daily.        . Naproxen Sodium 220 MG CAPS Take 220-440 mg by mouth 2 (two) times daily as needed.      . nitroGLYCERIN (NITROSTAT) 0.4 MG SL tablet Place 1 tablet (0.4 mg total) under the tongue every 5 (five) minutes as needed.  25 tablet  1  . pantoprazole (PROTONIX) 40 MG tablet Take 1 tablet (40 mg total) by mouth daily as needed.  30 tablet  11  . rosuvastatin (CRESTOR) 10 MG tablet Take 1 tablet (10 mg total) by mouth daily.  30 tablet  11   No current  facility-administered medications on file prior to visit.    Allergies  Allergen Reactions  . Cilostazol     REACTION: stomach problems- constipation  . Simvastatin     REACTION: myalgia    Past Medical History  Diagnosis Date  . HTN (hypertension)   . PVD (peripheral vascular disease)     s/p aortobifemoral bypass, with bilateral SFA occlusion and intermittent claudication   . Osteoarthritis   . Small bowel obstruction   . Diverticulosis   . CAD (coronary artery disease)     s/p PCI after presenting with exertional angina (drug eluting stent)  . Hyperlipidemia   . GERD (gastroesophageal reflux disease)     Past Surgical History  Procedure Laterality Date  . Cholecystectomy  2002  . Aorto-bifem  2003    hayes     Family History  Problem Relation Age of Onset  . Coronary artery disease Paternal Grandfather   . Diabetes Father     DM - and brother   . Colon cancer Neg Hx   . Prostate cancer Neg Hx     History   Social History  . Marital Status: Married    Spouse Name: N/A    Number of Children: 3  . Years of Education: N/A   Occupational History  . 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 Smoking status: Former Smoker    Types: Cigarettes    Quit date: 10/20/1992  . Smokeless tobacco: Never Used     Comment: quit in 1994   . Alcohol Use: Yes     Comment: rare  . Drug Use: No  . Sexual Activity: Not on file   Other Topics Concern  . Not on file   Social History Narrative   Widowed; then remarried; 3 sons.   Consultant pharmacist.       Has a living will - wife has health care POA    Would want resuscitation attempts   Would accept brief trial of artificial nutrition      Pt signed designated party release form and gives [place] [phone] (home #), access to medical records. Can also leave msg on home answering machine. Cell # [phone]   Review of Systems Weight down 8# Trying to eat  healthier---appetite is good Bowels are fine Voids fine---nocturia x 2 (stable). No daytime problems Sleeps well    Objective:   Physical Exam  Constitutional: He is oriented to person, place, and time. He appears well-developed and well-nourished. No distress.  HENT:  Mouth/Throat: Oropharynx is clear and moist. No oropharyngeal exudate.  Neck: Normal range of motion. Neck supple. No thyromegaly present.  Cardiovascular: Normal rate, regular rhythm and normal heart sounds.  Exam reveals no gallop.   No murmur heard. Pulmonary/Chest: Effort normal and breath sounds normal. No respiratory distress. He has no wheezes. He has no rales.  Musculoskeletal: He exhibits no edema.  Left knee without effusion Macmurrays positive---especially with lateral stress (on medial meniscus) Some crepitus No ligament instability  Lymphadenopathy:    He has no cervical adenopathy.  Neurological: He is alert and oriented to person, place, and time.  President-- "Obama, Peri [place], [place]" [phone] (some difficulty)-[phone] [id number] Recall-- 2/3  Skin:  Feet cool without pulses but no lesions  Psychiatric: He has a normal mood and affect. His behavior is normal.          Assessment & Plan:

## 2013-07-19 NOTE — Assessment & Plan Note (Signed)
Discussed alternatives  PROCEDURE Sterile prep to medial left knee Ethyl chloride then 2cc of 2%lido 40mg  depomedrol and 6cc 2% lido instilled without difficulty   If ongoing pain, will refer to ortho in [facility]

## 2013-07-19 NOTE — Assessment & Plan Note (Signed)
BP Readings from Last 3 Encounters:  07/19/13 130/80  01/12/13 132/68  06/25/12 110/68   Good control Due for labs

## 2013-07-19 NOTE — Assessment & Plan Note (Signed)
No problems with the statin 

## 2013-07-19 NOTE — Addendum Note (Signed)
Addended by: Sueanne Margarita on: 07/19/2013 12:39 PM   Modules accepted: Orders

## 2013-07-19 NOTE — Assessment & Plan Note (Signed)
Doing well No symptoms and working on fitness On ASA, statin, ARB, beta blocker

## 2013-07-19 NOTE — Patient Instructions (Signed)
DASH Diet  The DASH diet stands for "Dietary Approaches to Stop Hypertension." It is a healthy eating plan that has been shown to reduce high blood pressure (hypertension) in as little as 14 days, while also possibly providing other significant health benefits. These other health benefits include reducing the risk of breast cancer after menopause and reducing the risk of type 2 diabetes, heart disease, colon cancer, and stroke. Health benefits also include weight loss and slowing kidney failure in patients with chronic kidney disease.   DIET GUIDELINES  · Limit salt (sodium). Your diet should contain less than 1500 mg of sodium daily.  · Limit refined or processed carbohydrates. Your diet should include mostly whole grains. Desserts and added sugars should be used sparingly.  · Include small amounts of heart-healthy fats. These types of fats include nuts, oils, and tub margarine. Limit saturated and trans fats. These fats have been shown to be harmful in the body.  CHOOSING FOODS   The following food groups are based on a 2000 calorie diet. See your Registered Dietitian for individual calorie needs.  Grains and Grain Products (6 to 8 servings daily)  · Eat More Often: Whole-wheat bread, brown rice, whole-grain or wheat pasta, quinoa, popcorn without added fat or salt (air popped).  · Eat Less Often: White bread, white pasta, white rice, cornbread.  Vegetables (4 to 5 servings daily)  · Eat More Often: Fresh, frozen, and canned vegetables. Vegetables may be raw, steamed, roasted, or grilled with a minimal amount of fat.  · Eat Less Often/Avoid: Creamed or fried vegetables. Vegetables in a cheese sauce.  Fruit (4 to 5 servings daily)  · Eat More Often: All fresh, canned (in natural juice), or frozen fruits. Dried fruits without added sugar. One hundred percent fruit juice (½ cup [237 mL] daily).  · Eat Less Often: Dried fruits with added sugar. Canned fruit in light or heavy syrup.  Lean Meats, Fish, and Poultry (2  servings or less daily. One serving is 3 to 4 oz [85-114 g]).  · Eat More Often: Ninety percent or leaner ground beef, tenderloin, sirloin. Round cuts of beef, chicken breast, turkey breast. All fish. Grill, bake, or broil your meat. Nothing should be fried.  · Eat Less Often/Avoid: Fatty cuts of meat, turkey, or chicken leg, thigh, or wing. Fried cuts of meat or fish.  Dairy (2 to 3 servings)  · Eat More Often: Low-fat or fat-free milk, low-fat plain or light yogurt, reduced-fat or part-skim cheese.  · Eat Less Often/Avoid: Milk (whole, 2%). Whole milk yogurt. Full-fat cheeses.  Nuts, Seeds, and Legumes (4 to 5 servings per week)  · Eat More Often: All without added salt.  · Eat Less Often/Avoid: Salted nuts and seeds, canned beans with added salt.  Fats and Sweets (limited)  · Eat More Often: Vegetable oils, tub margarines without trans fats, sugar-free gelatin. Mayonnaise and salad dressings.  · Eat Less Often/Avoid: Coconut oils, palm oils, butter, stick margarine, cream, half and half, cookies, candy, pie.  FOR MORE INFORMATION  The Dash Diet Eating Plan: www.dashdiet.org  Document Released: 09/25/2011 Document Revised: 12/29/2011 Document Reviewed: 09/25/2011  ExitCare® Patient Information ©2014 [employer].

## 2013-07-19 NOTE — Assessment & Plan Note (Signed)
I have personally reviewed the Medicare Annual Wellness questionnaire and have noted 1. The patient's medical and social history 2. Their use of alcohol, tobacco or illicit drugs 3. Their current medications and supplements 4. The patient's functional ability including ADL's, fall risks, home safety risks and hearing or visual             impairment. 5. Diet and physical activities 6. Evidence for depression or mood disorders  The patients weight, height, BMI and visual acuity have been recorded in the chart I have made referrals, counseling and provided education to the patient based review of the above and I have provided the pt with a written personalized care plan for preventive services.  I have provided you with a copy of your personalized plan for preventive services. Please take the time to review along with your updated medication list.  No cancer screening Flu shot today DASH diet info

## 2013-09-26 ENCOUNTER — Encounter: Payer: Self-pay | Admitting: Physician Assistant

## 2013-09-26 ENCOUNTER — Telehealth: Payer: Self-pay | Admitting: *Deleted

## 2013-09-26 ENCOUNTER — Ambulatory Visit (INDEPENDENT_AMBULATORY_CARE_PROVIDER_SITE_OTHER): Payer: Medicare Other | Admitting: Physician Assistant

## 2013-09-26 VITALS — BP 124/70 | HR 76 | Ht 69.0 in | Wt 209.2 lb

## 2013-09-26 DIAGNOSIS — Z86010 Personal history of colonic polyps: Secondary | ICD-10-CM | POA: Diagnosis not present

## 2013-09-26 DIAGNOSIS — Z7901 Long term (current) use of anticoagulants: Secondary | ICD-10-CM | POA: Diagnosis not present

## 2013-09-26 DIAGNOSIS — Z1211 Encounter for screening for malignant neoplasm of colon: Secondary | ICD-10-CM | POA: Diagnosis not present

## 2013-09-26 MED ORDER — MOVIPREP 100 G PO SOLR: 1.0000 | Freq: Once | ORAL | Status: DC

## 2013-09-26 NOTE — Progress Notes (Signed)
Subjective:    Patient ID: Brandon Lewis, male    DOB: 08-Oct-1934, 79 y.o.   MRN: [id number]  HPI  Brandon Lewis is a very nice 79 year old white male known to Dr. Lina Sar who has history of adenomatous colon polyps. He comes in today to discuss followup colonoscopy at a five-year interval. Caps last colonoscopy was in January of 2009 and he had one 3 mm polyp removed from the sigmoid colon which was a tubular adenoma. Also noted to have diverticulosis. Patient is on chronic Plavix with history of coronary artery disease status post drug-eluting stent in 2010. He also has peripheral vascular disease and is status post aortobifemoral bypass. He has some persistent intermittent claudication. He also has history of hypertension, hyperlipidemia,and  GERD . He has no current GI symptoms. Specifically no complaints of abdominal pain changes in his bowel habits melena or hematochezia. .   Review of Systems  Constitutional: Negative.   HENT: Negative.   Eyes: Negative.   Respiratory: Negative.   Cardiovascular: Negative.   Gastrointestinal: Negative.   Endocrine: Negative.   Genitourinary: Negative.   Musculoskeletal: Positive for gait problem.  Skin: Negative.   Allergic/Immunologic: Negative.   Neurological: Negative.   Hematological: Negative.   Psychiatric/Behavioral: Negative.    Outpatient Prescriptions Prior to Visit  Medication Sig Dispense Refill  . aspirin 81 MG EC tablet Take 81 mg by mouth daily.        . cholecalciferol (VITAMIN D) 1000 UNITS tablet Take 1,000 Units by mouth daily.      . clopidogrel (PLAVIX) 75 MG tablet Take 1 tablet (75 mg total) by mouth daily.  30 tablet  11  . fluticasone (FLONASE) 50 MCG/ACT nasal spray Place 2 sprays into the nose daily as needed.       Marland Kitchen HYDROcodone-acetaminophen (NORCO/VICODIN) 5-325 MG per tablet Take 1 tablet by mouth 3 (three) times daily as needed.  30 tablet  0  . losartan (COZAAR) 100 MG tablet Take 1 tablet (100 mg total)  by mouth daily.  30 tablet  11  . metoprolol succinate (TOPROL-XL) 50 MG 24 hr tablet Take 1 tablet (50 mg total) by mouth daily.  30 tablet  11  . Multiple Vitamin (MULTIVITAMIN) tablet Take 1 tablet by mouth daily.        . Naproxen Sodium 220 MG CAPS Take 220-440 mg by mouth 2 (two) times daily as needed.      . nitroGLYCERIN (NITROSTAT) 0.4 MG SL tablet Place 1 tablet (0.4 mg total) under the tongue every 5 (five) minutes as needed.  25 tablet  1  . pantoprazole (PROTONIX) 40 MG tablet Take 1 tablet (40 mg total) by mouth daily as needed.  30 tablet  11  . rosuvastatin (CRESTOR) 10 MG tablet Take 1 tablet (10 mg total) by mouth daily.  30 tablet  11   No facility-administered medications prior to visit.   Allergies  Allergen Reactions  . Cilostazol     REACTION: stomach problems- constipation  . Simvastatin     REACTION: myalgia   Patient Active Problem List   Diagnosis Date Noted  . Derangement of meniscus of left knee 07/19/2013  . Routine general medical examination at a health care facility 06/25/2012  . GERD (gastroesophageal reflux disease)   . IMPAIRED FASTING GLUCOSE 03/08/2010  . CAD, NATIVE VESSEL 05/30/2009  . HYPERLIPIDEMIA 03/02/2009  . VERTIGO 03/02/2009  . HYPERTENSION 09/22/2007  . PERIPHERAL VASCULAR DISEASE 09/22/2007  . OSTEOARTHRITIS 09/22/2007   History  Substance Use Topics  . Smoking status: Former Smoker    Types: Cigarettes    Quit date: 10/20/1992  . Smokeless tobacco: Never Used     Comment: quit in 1994   . Alcohol Use: Yes     Comment: rare   family history includes Coronary artery disease in his paternal grandfather; Diabetes in his father. There is no history of Colon cancer or Prostate cancer.     Objective:   Physical Exam  Well-developed older white male in no acute distress, pleasant blood pressure 124/70 pulse 76 height 5 foot 9 weight 209. HEENT; nontraumatic normocephalic EOMI PERRLA sclera anicteric,Neck Supple; no JVD,  Cardiovascular regular rate and rhythm with S1-S2 no murmur or gallop, Pulmonary ;clear bilaterally, Abdomen; soft nontender nondistended bowel sounds are active there is no palpable mass or hepatosplenomegaly, Rectal; not done, Extremities ;no clubbing cyanosis or edema skin warm and dry, Psych; mood and affect appropriate        Assessment & Plan:   #13  79 year old male with history of adenomatous colon polyps due for 5 year interval followup. #2 chronic antiplatelet therapy with Plavix and aspirin #3 history of coronary artery disease status post  Drug eluting stent  2010. #4 peripheral vascular disease status post aortobifemoral bypass with persistent intermittent claudication #5 hypertension #6 hyperlipidemia  Plan;patient is scheduled for colonoscopy with Dr. Chauncy Passy discussed in detail with the patient and he is agreeable to proceed. Will obtain consent from Dr. Alphonsus Sias  For patient to hold Plavix 5 days prior to his procedure.

## 2013-09-26 NOTE — Telephone Encounter (Signed)
09/26/2013   RE: Brandon Lewis DOB: 08-Feb-1934 MRN: [id number]   Dear Dr. Tillman Abide,    We have scheduled the above patient for an endoscopic procedure. Our records show that he is on anticoagulation therapy.   Please advise as to how long the patient may come off his therapy of Plavix prior to the procedure, which is scheduled for 10-05-2013. We normally recommend the patient hold the Plavix for 5 days prior to the procedure date, unless you decide otherwise.   Please fax back/ or route the completed form to [facility] CMA at [phone].   Sincerely,     Amy Esterwood PA-C    Lowry Ram CMA

## 2013-09-26 NOTE — Telephone Encounter (Signed)
It would be okay for him to be off the plavix for 5 days prior to the procedure.  If you need to hold the aspirin, that would be okay also.

## 2013-09-26 NOTE — Telephone Encounter (Signed)
See phone note from 09-26-2013.

## 2013-09-26 NOTE — Patient Instructions (Signed)
You have been scheduled for a colonoscopy with propofol. Please follow written instructions given to you at your visit today.   We will send Dr. Alphonsus Sias a letter advising him of the colonoscopy and we advised you to hold you to take the last Plavix pill on the 11th.   Please pick up your prep kit at the pharmacy within the next 1-3 days. If you use inhalers (even only as needed), please bring them with you on the day of your procedure. Your physician has requested that you go to www.startemmi.com and enter the access code given to you at your visit today. This web site gives a general overview about your procedure. However, you should still follow specific instructions given to you by our office regarding your preparation for the procedure.

## 2013-09-26 NOTE — Telephone Encounter (Signed)
I did call the patient to advise him that Dr. Alphonsus Sias got back to [place].  His last day can be the 11th to take the Plavix and he can resume it on 10-06-2013.  The patient understood the instructions.

## 2013-09-27 ENCOUNTER — Other Ambulatory Visit: Payer: Self-pay | Admitting: *Deleted

## 2013-09-27 NOTE — Progress Notes (Signed)
Reviewed and agree.

## 2013-10-05 ENCOUNTER — Ambulatory Visit (AMBULATORY_SURGERY_CENTER): Payer: Medicare Other | Admitting: Internal Medicine

## 2013-10-05 ENCOUNTER — Encounter: Payer: Self-pay | Admitting: Internal Medicine

## 2013-10-05 VITALS — BP 117/72 | HR 73 | Temp 97.1°F | Resp 12 | Ht 69.0 in | Wt 209.0 lb

## 2013-10-05 DIAGNOSIS — D126 Benign neoplasm of colon, unspecified: Secondary | ICD-10-CM | POA: Diagnosis not present

## 2013-10-05 DIAGNOSIS — Z860100 Personal history of colon polyps, unspecified: Secondary | ICD-10-CM

## 2013-10-05 DIAGNOSIS — I10 Essential (primary) hypertension: Secondary | ICD-10-CM | POA: Diagnosis not present

## 2013-10-05 DIAGNOSIS — K649 Unspecified hemorrhoids: Secondary | ICD-10-CM

## 2013-10-05 DIAGNOSIS — I209 Angina pectoris, unspecified: Secondary | ICD-10-CM | POA: Diagnosis not present

## 2013-10-05 DIAGNOSIS — Z1211 Encounter for screening for malignant neoplasm of colon: Secondary | ICD-10-CM | POA: Diagnosis not present

## 2013-10-05 MED ORDER — HYDROCORTISONE ACETATE 25 MG RE SUPP: 25.0000 mg | Freq: Every day | RECTAL | Status: DC

## 2013-10-05 MED ORDER — SODIUM CHLORIDE 0.9 % IV SOLN: 500.0000 mL | INTRAVENOUS | Status: DC

## 2013-10-05 NOTE — Progress Notes (Signed)
Called to room to assist during endoscopic procedure.  Patient ID and intended procedure confirmed with present staff. Received instructions for my participation in the procedure from the performing physician.  

## 2013-10-05 NOTE — Progress Notes (Signed)
A/ox3 pleased with MAC, report to Celia RN 

## 2013-10-05 NOTE — Op Note (Signed)
[facility] [address] [place], [address]   COLONOSCOPY PROCEDURE REPORT  PATIENT: Brandon Lewis, Brandon Lewis  MR#: [id number] BIRTHDATE: 04-11-1934 , 79  yrs. old GENDER: Male ENDOSCOPIST: Hart Carwin, MD REFERRED QI:[id number] Alphonsus Sias, M.D. PROCEDURE DATE:  10/05/2013 PROCEDURE:   Colonoscopy with snare polypectomy First Screening Colonoscopy - Avg.  risk and is 50 yrs.  old or older - No.  Prior Negative Screening - Now for repeat screening. N/A  History of Adenoma - Now for follow-up colonoscopy & has been > or = to 3 yrs.  Yes hx of adenoma.  Has been 3 or more years since last colonoscopy.  Polyps Removed Today? Yes. ASA CLASS:   Class II INDICATIONS:tubular adenoma and hyperplastic polyp on colonoscopy in January 2009. MEDICATIONS: MAC sedation, administered by CRNA and propofol (Diprivan) 150mg  IV  DESCRIPTION OF PROCEDURE:   After the risks benefits and alternatives of the procedure were thoroughly explained, informed consent was obtained.  A digital rectal exam revealed no abnormalities of the rectum.   The LB [id number]  endoscope was introduced through the anus and advanced to the cecum, which was identified by both the appendix and ileocecal valve. No adverse events experienced.   The quality of the prep was good, using MoviPrep  The instrument was then slowly withdrawn as the colon was fully examined.      COLON FINDINGS: A smooth sessile polyp ranging between 3-7mm in size was found in the ascending colon.  A polypectomy was performed with a cold snare.  The resection was complete and the polyp tissue was completely retrieved.   Mild diverticulosis was noted in the sigmoid colon.   Small internal hemorrhoids were found. Retroflexed views revealed no abnormalities. The time to cecum=4 minutes 2 seconds.  Withdrawal time=7 minutes 29 seconds.  The scope was withdrawn and the procedure completed. COMPLICATIONS: There were no  complications.  ENDOSCOPIC IMPRESSION: 1.   Sessile polyp ranging between 3-27mm in size was found in the ascending colon; polypectomy was performed with a cold snare 2.   Mild diverticulosis was noted in the sigmoid colon 3.   Small internal hemorrhoids  RECOMMENDATIONS: 1.  Await pathology results 2.  high-fiber diet Recall colonoscopy pending pathology   eSigned:  Hart Carwin, MD 10/05/2013 11:51 AM   cc:

## 2013-10-05 NOTE — Patient Instructions (Signed)
Discharge instructions given with verbal understanding. Handouts on polyps,diverticulosis and hemorrhoids given. Resume previous medications. YOU HAD AN ENDOSCOPIC PROCEDURE TODAY AT THE [facility]: Refer to the procedure report that was given to you for any specific questions about what was found during the examination.  If the procedure report does not answer your questions, please call your gastroenterologist to clarify.  If you requested that your care partner not be given the details of your procedure findings, then the procedure report has been included in a sealed envelope for you to review at your convenience later.  YOU SHOULD EXPECT: Some feelings of bloating in the abdomen. Passage of more gas than usual.  Walking can help get rid of the air that was put into your GI tract during the procedure and reduce the bloating. If you had a lower endoscopy (such as a colonoscopy or flexible sigmoidoscopy) you may notice spotting of blood in your stool or on the toilet paper. If you underwent a bowel prep for your procedure, then you may not have a normal bowel movement for a few days.  DIET: Your first meal following the procedure should be a light meal and then it is ok to progress to your normal diet.  A half-sandwich or bowl of soup is an example of a good first meal.  Heavy or fried foods are harder to digest and may make you feel nauseous or bloated.  Likewise meals heavy in dairy and vegetables can cause extra gas to form and this can also increase the bloating.  Drink plenty of fluids but you should avoid alcoholic beverages for 24 hours.  ACTIVITY: Your care partner should take you home directly after the procedure.  You should plan to take it easy, moving slowly for the rest of the day.  You can resume normal activity the day after the procedure however you should NOT DRIVE or use heavy machinery for 24 hours (because of the sedation medicines used during the test).    SYMPTOMS TO  REPORT IMMEDIATELY: A gastroenterologist can be reached at any hour.  During normal business hours, 8:30 AM to 5:00 PM Monday through Friday, call [phone].  After hours and on weekends, please call the GI answering service at [phone] who will take a message and have the physician on call contact you.   Following lower endoscopy (colonoscopy or flexible sigmoidoscopy):  Excessive amounts of blood in the stool  Significant tenderness or worsening of abdominal pains  Swelling of the abdomen that is new, acute  Fever of 100F or higher  FOLLOW UP: If any biopsies were taken you will be contacted by phone or by letter within the next 1-3 weeks.  Call your gastroenterologist if you have not heard about the biopsies in 3 weeks.  Our staff will call the home number listed on your records the next business day following your procedure to check on you and address any questions or concerns that you may have at that time regarding the information given to you following your procedure. This is a courtesy call and so if there is no answer at the home number and we have not heard from you through the emergency physician on call, we will assume that you have returned to your regular daily activities without incident.  SIGNATURES/CONFIDENTIALITY: You and/or your care partner have signed paperwork which will be entered into your electronic medical record.  These signatures attest to the fact that that the information above on your After Visit   Summary has been reviewed and is understood.  Full responsibility of the confidentiality of this discharge information lies with you and/or your care-partner.  

## 2013-10-06 ENCOUNTER — Telehealth: Payer: Self-pay

## 2013-10-06 NOTE — Telephone Encounter (Signed)
Left message on answering machine. 

## 2013-10-11 ENCOUNTER — Encounter: Payer: Self-pay | Admitting: Internal Medicine

## 2014-01-17 ENCOUNTER — Encounter: Payer: Self-pay | Admitting: Internal Medicine

## 2014-01-17 ENCOUNTER — Ambulatory Visit (INDEPENDENT_AMBULATORY_CARE_PROVIDER_SITE_OTHER): Payer: Medicare Other | Admitting: Internal Medicine

## 2014-01-17 VITALS — BP 118/70 | HR 64 | Temp 98.5°F | Wt 205.0 lb

## 2014-01-17 DIAGNOSIS — I2510 Atherosclerotic heart disease of native coronary artery without angina pectoris: Secondary | ICD-10-CM | POA: Diagnosis not present

## 2014-01-17 DIAGNOSIS — I739 Peripheral vascular disease, unspecified: Secondary | ICD-10-CM

## 2014-01-17 DIAGNOSIS — I10 Essential (primary) hypertension: Secondary | ICD-10-CM | POA: Diagnosis not present

## 2014-01-17 DIAGNOSIS — K219 Gastro-esophageal reflux disease without esophagitis: Secondary | ICD-10-CM | POA: Diagnosis not present

## 2014-01-17 NOTE — Assessment & Plan Note (Signed)
BP Readings from Last 3 Encounters:  01/17/14 118/70  10/05/13 117/72  09/26/13 124/70   Good control No changes needed

## 2014-01-17 NOTE — Progress Notes (Signed)
Subjective:    Patient ID: Brandon Lewis, male    DOB: Oct 24, 1933, 80 y.o.   MRN: [id number]  HPI Doing well His left knee is much better now No major pain Trying to exercise some  Still has claudication Limits his walking distance  No chest pain No SOB No edema No dizziness or syncope  Heartburn mostly controlled---unless he has dietary indiscretion No swallowing problems  Current Outpatient Prescriptions on File Prior to Visit  Medication Sig Dispense Refill  . aspirin 81 MG EC tablet Take 81 mg by mouth daily.        . cholecalciferol (VITAMIN D) 1000 UNITS tablet Take 1,000 Units by mouth daily.      . clopidogrel (PLAVIX) 75 MG tablet Take 1 tablet (75 mg total) by mouth daily.  30 tablet  11  . fluticasone (FLONASE) 50 MCG/ACT nasal spray Place 2 sprays into the nose daily as needed.       Marland Kitchen HYDROcodone-acetaminophen (NORCO/VICODIN) 5-325 MG per tablet Take 1 tablet by mouth 3 (three) times daily as needed.  30 tablet  0  . hydrocortisone (ANUSOL-HC) 25 MG suppository Place 1 suppository (25 mg total) rectally at bedtime.  12 suppository  1  . losartan (COZAAR) 100 MG tablet Take 1 tablet (100 mg total) by mouth daily.  30 tablet  11  . metoprolol succinate (TOPROL-XL) 50 MG 24 hr tablet Take 1 tablet (50 mg total) by mouth daily.  30 tablet  11  . Multiple Vitamin (MULTIVITAMIN) tablet Take 1 tablet by mouth daily.        . Naproxen Sodium 220 MG CAPS Take 220-440 mg by mouth 2 (two) times daily as needed.      . nitroGLYCERIN (NITROSTAT) 0.4 MG SL tablet Place 1 tablet (0.4 mg total) under the tongue every 5 (five) minutes as needed.  25 tablet  1  . pantoprazole (PROTONIX) 40 MG tablet Take 1 tablet (40 mg total) by mouth daily as needed.  30 tablet  11  . rosuvastatin (CRESTOR) 10 MG tablet Take 1 tablet (10 mg total) by mouth daily.  30 tablet  11   No current facility-administered medications on file prior to visit.    Allergies  Allergen Reactions  .  Cilostazol     REACTION: stomach problems- constipation  . Simvastatin     REACTION: myalgia    Past Medical History  Diagnosis Date  . HTN (hypertension)   . PVD (peripheral vascular disease)     s/p aortobifemoral bypass, with bilateral SFA occlusion and intermittent claudication   . Osteoarthritis   . Small bowel obstruction   . Diverticulosis   . CAD (coronary artery disease)     s/p PCI after presenting with exertional angina (drug eluting stent)  . Hyperlipidemia   . GERD (gastroesophageal reflux disease)     Past Surgical History  Procedure Laterality Date  . Cholecystectomy  2002  . Aorto-bifem  2003    [facility]   . Carotid stent      [facility]    Family History  Problem Relation Age of Onset  . Coronary artery disease Paternal Grandfather   . Diabetes Father     DM - and brother   . Colon cancer Neg Hx   . Prostate cancer Neg Hx     History   Social History  . Marital Status: Married    Spouse Name: N/A    Number of Children: 3  . Years of Education: N/A  Occupational History  . 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 Smoking status: Former Smoker    Types: Cigarettes    Quit date: 10/20/1992  . Smokeless tobacco: Never Used     Comment: quit in 1994   . Alcohol Use: Yes     Comment: rare  . Drug Use: No  . Sexual Activity: Not on file   Other Topics Concern  . Not on file   Social History Narrative   Widowed; then remarried; 3 sons.   Consultant pharmacist.       Has a living will - wife has health care POA    Would want resuscitation attempts   Would accept brief trial of artificial nutrition      Pt signed designated party release form and gives [name] [phone] (home #), access to medical records. Can also leave msg on home answering machine. Cell # [id number]   Review of Systems Weight is stable Sleeps well    Objective:   Physical Exam  Constitutional: He appears  well-developed and well-nourished. No distress.  Neck: Normal range of motion. Neck supple. No thyromegaly present.  Cardiovascular: Normal rate, regular rhythm and normal heart sounds.  Exam reveals no gallop.   No murmur heard. No pedal pulses  Pulmonary/Chest: Effort normal and breath sounds normal. No respiratory distress. He has no wheezes. He has no rales.  Musculoskeletal: He exhibits no edema and no tenderness.  Lymphadenopathy:    He has no cervical adenopathy.  Psychiatric: He has a normal mood and affect. His behavior is normal.          Assessment & Plan:

## 2014-01-17 NOTE — Assessment & Plan Note (Signed)
Doing fine on the Rx No change

## 2014-01-17 NOTE — Assessment & Plan Note (Signed)
No symptoms On appropriate regimen

## 2014-01-17 NOTE — Progress Notes (Signed)
Pre visit review using our clinic review tool, if applicable. No additional management support is needed unless otherwise documented below in the visit note. 

## 2014-01-17 NOTE — Assessment & Plan Note (Signed)
Still limited but stable Handicapped permit form done

## 2014-01-18 ENCOUNTER — Telehealth: Payer: Self-pay | Admitting: Internal Medicine

## 2014-01-18 NOTE — Telephone Encounter (Signed)
Relevant patient education assigned to patient using Emmi. ° °

## 2014-01-31 ENCOUNTER — Other Ambulatory Visit: Payer: Self-pay | Admitting: Internal Medicine

## 2014-07-25 ENCOUNTER — Ambulatory Visit (INDEPENDENT_AMBULATORY_CARE_PROVIDER_SITE_OTHER): Payer: Medicare Other | Admitting: Internal Medicine

## 2014-07-25 ENCOUNTER — Encounter: Payer: Self-pay | Admitting: Internal Medicine

## 2014-07-25 VITALS — BP 122/74 | HR 64 | Temp 98.3°F | Resp 16 | Wt 210.8 lb

## 2014-07-25 DIAGNOSIS — Z0000 Encounter for general adult medical examination without abnormal findings: Secondary | ICD-10-CM | POA: Diagnosis not present

## 2014-07-25 DIAGNOSIS — I2510 Atherosclerotic heart disease of native coronary artery without angina pectoris: Secondary | ICD-10-CM | POA: Diagnosis not present

## 2014-07-25 DIAGNOSIS — Z23 Encounter for immunization: Secondary | ICD-10-CM

## 2014-07-25 DIAGNOSIS — I739 Peripheral vascular disease, unspecified: Secondary | ICD-10-CM | POA: Diagnosis not present

## 2014-07-25 DIAGNOSIS — R7301 Impaired fasting glucose: Secondary | ICD-10-CM

## 2014-07-25 DIAGNOSIS — E785 Hyperlipidemia, unspecified: Secondary | ICD-10-CM

## 2014-07-25 DIAGNOSIS — I70209 Unspecified atherosclerosis of native arteries of extremities, unspecified extremity: Secondary | ICD-10-CM

## 2014-07-25 DIAGNOSIS — Z7189 Other specified counseling: Secondary | ICD-10-CM

## 2014-07-25 DIAGNOSIS — I10 Essential (primary) hypertension: Secondary | ICD-10-CM

## 2014-07-25 DIAGNOSIS — M159 Polyosteoarthritis, unspecified: Secondary | ICD-10-CM

## 2014-07-25 LAB — CBC WITH DIFFERENTIAL/PLATELET
BASOS PCT: 0.4 % (ref 0.0–3.0)
Basophils Absolute: 0 10*3/uL (ref 0.0–0.1)
Eosinophils Absolute: 0.3 10*3/uL (ref 0.0–0.7)
Eosinophils Relative: 2.6 % (ref 0.0–5.0)
HCT: 44 % (ref 39.0–52.0)
Hemoglobin: 14.8 g/dL (ref 13.0–17.0)
LYMPHS PCT: 30 % (ref 12.0–46.0)
Lymphs Abs: 3.2 10*3/uL (ref 0.7–4.0)
MCHC: 33.5 g/dL (ref 30.0–36.0)
MCV: 95.7 fl (ref 78.0–100.0)
MONOS PCT: 10 % (ref 3.0–12.0)
Monocytes Absolute: 1.1 10*3/uL — ABNORMAL HIGH (ref 0.1–1.0)
NEUTROS ABS: 6 10*3/uL (ref 1.4–7.7)
NEUTROS PCT: 57 % (ref 43.0–77.0)
Platelets: 200 10*3/uL (ref 150.0–400.0)
RBC: 4.59 Mil/uL (ref 4.22–5.81)
RDW: 14.3 % (ref 11.5–15.5)
WBC: 10.6 10*3/uL — ABNORMAL HIGH (ref 4.0–10.5)

## 2014-07-25 LAB — HEMOGLOBIN A1C: Hgb A1c MFr Bld: 6.7 % — ABNORMAL HIGH (ref 4.6–6.5)

## 2014-07-25 MED ORDER — SILDENAFIL CITRATE 20 MG PO TABS: 60.0000 mg | ORAL_TABLET | Freq: Every day | ORAL | Status: DC | PRN

## 2014-07-25 NOTE — Assessment & Plan Note (Signed)
Stable exercise tolerance Continue current meds

## 2014-07-25 NOTE — Assessment & Plan Note (Signed)
Has been quiet On appropriate regimen No angina

## 2014-07-25 NOTE — Progress Notes (Signed)
Pre visit review using our clinic review tool, if applicable. No additional management support is needed unless otherwise documented below in the visit note. 

## 2014-07-25 NOTE — Assessment & Plan Note (Signed)
BP Readings from Last 3 Encounters:  07/25/14 122/74  01/17/14 118/70  10/05/13 117/72   Good control Due for labs

## 2014-07-25 NOTE — Assessment & Plan Note (Signed)
Rarely uses the naproxen Advised him to take the protonix if he uses it more than rarely

## 2014-07-25 NOTE — Assessment & Plan Note (Signed)
See social history 

## 2014-07-25 NOTE — Assessment & Plan Note (Signed)
No problems with statin 

## 2014-07-25 NOTE — Progress Notes (Signed)
Subjective:    Patient ID: Brandon Lewis, male    DOB: 1934-05-30, 80 y.o.   MRN: [id number]  HPI Here for Medicare wellness and follow up Reviewed advanced directives and form Only sees brother for eye exams (though he is retiring) Sees Dr Thereasa Parkin as dentist---keeps up with care Rare alcohol. No tobacco. Tries to stay active.  Ongoing hearing problems--has hearing aides but they need adjustment Vision is good--but left eye tearing this AM No cognitive problems No falls  No depressed mood or anhedonia Wears seat belt  Tries to stay active Recent visit at beach---ate too much Does golf some Some claudication but no change in exercise tolerance  No chest pain No SOB or decrease in exercise tolerance No dizziness or syncope No edema  Ongoing arthritis pain Uses naproxen occasionally-- like once a week or less  Stomach has been quiet Hasn't needed the protonix  Current Outpatient Prescriptions on File Prior to Visit  Medication Sig Dispense Refill  . aspirin 81 MG EC tablet Take 81 mg by mouth daily.        . cholecalciferol (VITAMIN D) 1000 UNITS tablet Take 1,000 Units by mouth daily.      . clopidogrel (PLAVIX) 75 MG tablet TAKE ONE TABLET BY MOUTH EACH DAY.  30 tablet  11  . CRESTOR 10 MG tablet TAKE ONE TABLET BY MOUTH AT BEDTIME. (IMPROVES CHOLESTEROL)  30 tablet  11  . fluticasone (FLONASE) 50 MCG/ACT nasal spray Place 2 sprays into the nose daily as needed.       Marland Kitchen HYDROcodone-acetaminophen (NORCO/VICODIN) 5-325 MG per tablet Take 1 tablet by mouth 3 (three) times daily as needed.  30 tablet  0  . hydrocortisone (ANUSOL-HC) 25 MG suppository Place 1 suppository (25 mg total) rectally at bedtime.  12 suppository  1  . losartan (COZAAR) 100 MG tablet TAKE ONE TABLET BY MOUTH EACH MORNING FOR BLOOD PRESSURE CONTROL.  30 tablet  11  . metoprolol succinate (TOPROL-XL) 50 MG 24 hr tablet TAKE (1) TABLET BY MOUTH ONCE A DAY. (HIGH BLOOD PRESSURE)  30 tablet  11  .  Multiple Vitamin (MULTIVITAMIN) tablet Take 1 tablet by mouth daily.        . Naproxen Sodium 220 MG CAPS Take 220-440 mg by mouth 2 (two) times daily as needed.      . nitroGLYCERIN (NITROSTAT) 0.4 MG SL tablet Place 1 tablet (0.4 mg total) under the tongue every 5 (five) minutes as needed.  25 tablet  1  . pantoprazole (PROTONIX) 40 MG tablet Take 1 tablet (40 mg total) by mouth daily as needed.  30 tablet  11   No current facility-administered medications on file prior to visit.    Allergies  Allergen Reactions  . Cilostazol     REACTION: stomach problems- constipation  . Simvastatin     REACTION: myalgia    Past Medical History  Diagnosis Date  . HTN (hypertension)   . PVD (peripheral vascular disease)     s/p aortobifemoral bypass, with bilateral SFA occlusion and intermittent claudication   . Osteoarthritis   . Small bowel obstruction   . Diverticulosis   . CAD (coronary artery disease)     s/p PCI after presenting with exertional angina (drug eluting stent)  . Hyperlipidemia   . GERD (gastroesophageal reflux disease)     Past Surgical History  Procedure Laterality Date  . Cholecystectomy  2002  . Aorto-bifem  2003    [facility]   . Carotid stent  [facility]    Family History  Problem Relation Age of Onset  . Coronary artery disease Paternal Grandfather   . Diabetes Father     DM - and brother   . Colon cancer Neg Hx   . Prostate cancer Neg Hx     History   Social History  . Marital Status: Married    Spouse Name: N/A    Number of Children: 3  . Years of Education: N/A   Occupational History  . 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 Smoking status: Former Smoker    Types: Cigarettes    Quit date: 10/20/1992  . Smokeless tobacco: Never Used     Comment: quit in 1994   . Alcohol Use: Yes     Comment: rare  . Drug Use: No  . Sexual Activity: Not on file   Other Topics Concern  . Not on file    Social History Narrative   Widowed; then remarried; 3 sons.   Consultant pharmacist.       Has a living will - wife has health care POA    Would want resuscitation attempts   Would accept brief trial of artificial nutrition      Pt signed designated party release form and gives Osualdo Hansell [phone] (home #), access to medical records. Can also leave msg on home answering machine. Cell # [id number]   Review of Systems Recent colonoscopy-- still some polyps Sleeps well Voids okay. Stream is fine and no daytime problems. Nocturia stable x 2 Wart on back of right hand--doesn't bother him enough for Rx. No other skin problems Has had some ED-- interested in meds    Objective:   Physical Exam  Constitutional: He is oriented to person, place, and time. He appears well-developed and well-nourished. No distress.  HENT:  Mouth/Throat: Oropharynx is clear and moist. No oropharyngeal exudate.  Neck: Normal range of motion. Neck supple. No thyromegaly present.  Cardiovascular: Normal rate, regular rhythm and normal heart sounds.  Exam reveals no gallop.   No murmur heard. Feet cool but well perfused Pulses not palpable  Pulmonary/Chest: Effort normal and breath sounds normal. No respiratory distress. He has no wheezes. He has no rales.  Abdominal: Soft. There is no tenderness.  Musculoskeletal: He exhibits no edema and no tenderness.  Lymphadenopathy:    He has no cervical adenopathy.  Neurological: He is alert and oriented to person, place, and time.  President-- "Obama, Bush, Clinton" [phone] [id number] Recall 3/3  Skin: No rash noted.  ~34mm wart on right hand  Psychiatric: He has a normal mood and affect. His behavior is normal.          Assessment & Plan:

## 2014-07-25 NOTE — Patient Instructions (Signed)
DASH Eating Plan °DASH stands for "Dietary Approaches to Stop Hypertension." The DASH eating plan is a healthy eating plan that has been shown to reduce high blood pressure (hypertension). Additional health benefits may include reducing the risk of type 2 diabetes mellitus, heart disease, and stroke. The DASH eating plan may also help with weight loss. °WHAT DO I NEED TO KNOW ABOUT THE DASH EATING PLAN? °For the DASH eating plan, you will follow these general guidelines: °· Choose foods with a percent daily value for sodium of less than 5% (as listed on the food label). °· Use salt-free seasonings or herbs instead of table salt or sea salt. °· Check with your health care provider or pharmacist before using salt substitutes. °· Eat lower-sodium products, often labeled as "lower sodium" or "no salt added." °· Eat fresh foods. °· Eat more vegetables, fruits, and low-fat dairy products. °· Choose whole grains. Look for the word "whole" as the first word in the ingredient list. °· Choose fish and skinless chicken or turkey more often than red meat. Limit fish, poultry, and meat to 6 oz (170 g) each day. °· Limit sweets, desserts, sugars, and sugary drinks. °· Choose heart-healthy fats. °· Limit cheese to 1 oz (28 g) per day. °· Eat more home-cooked food and less restaurant, buffet, and fast food. °· Limit fried foods. °· Cook foods using methods other than frying. °· Limit canned vegetables. If you do use them, rinse them well to decrease the sodium. °· When eating at a restaurant, ask that your food be prepared with less salt, or no salt if possible. °WHAT FOODS CAN I EAT? °Seek help from a dietitian for individual calorie needs. °Grains °Whole grain or whole wheat bread. Brown rice. Whole grain or whole wheat pasta. Quinoa, bulgur, and whole grain cereals. Low-sodium cereals. Corn or whole wheat flour tortillas. Whole grain cornbread. Whole grain crackers. Low-sodium crackers. °Vegetables °Fresh or frozen vegetables  (raw, steamed, roasted, or grilled). Low-sodium or reduced-sodium tomato and vegetable juices. Low-sodium or reduced-sodium tomato sauce and paste. Low-sodium or reduced-sodium canned vegetables.  °Fruits °All fresh, canned (in natural juice), or frozen fruits. °Meat and Other Protein Products °Ground beef (85% or leaner), grass-fed beef, or beef trimmed of fat. Skinless chicken or turkey. Ground chicken or turkey. Pork trimmed of fat. All fish and seafood. Eggs. Dried beans, peas, or lentils. Unsalted nuts and seeds. Unsalted canned beans. °Dairy °Low-fat dairy products, such as skim or 1% milk, 2% or reduced-fat cheeses, low-fat ricotta or cottage cheese, or plain low-fat yogurt. Low-sodium or reduced-sodium cheeses. °Fats and Oils °Tub margarines without trans fats. Light or reduced-fat mayonnaise and salad dressings (reduced sodium). Avocado. Safflower, olive, or canola oils. Natural peanut or almond butter. °Other °Unsalted popcorn and pretzels. °The items listed above may not be a complete list of recommended foods or beverages. Contact your dietitian for more options. °WHAT FOODS ARE NOT RECOMMENDED? °Grains °White bread. White pasta. White rice. Refined cornbread. Bagels and croissants. Crackers that contain trans fat. °Vegetables °Creamed or fried vegetables. Vegetables in a cheese sauce. Regular canned vegetables. Regular canned tomato sauce and paste. Regular tomato and vegetable juices. °Fruits °Dried fruits. Canned fruit in light or heavy syrup. Fruit juice. °Meat and Other Protein Products °Fatty cuts of meat. Ribs, chicken wings, bacon, sausage, bologna, salami, chitterlings, fatback, hot dogs, bratwurst, and packaged luncheon meats. Salted nuts and seeds. Canned beans with salt. °Dairy °Whole or 2% milk, cream, half-and-half, and cream cheese. Whole-fat or sweetened yogurt. Full-fat   cheeses or blue cheese. Nondairy creamers and whipped toppings. Processed cheese, cheese spreads, or cheese  curds. °Condiments °Onion and garlic salt, seasoned salt, table salt, and sea salt. Canned and packaged gravies. Worcestershire sauce. Tartar sauce. Barbecue sauce. Teriyaki sauce. Soy sauce, including reduced sodium. Steak sauce. Fish sauce. Oyster sauce. Cocktail sauce. Horseradish. Ketchup and mustard. Meat flavorings and tenderizers. Bouillon cubes. Hot sauce. Tabasco sauce. Marinades. Taco seasonings. Relishes. °Fats and Oils °Butter, stick margarine, lard, shortening, ghee, and bacon fat. Coconut, palm kernel, or palm oils. Regular salad dressings. °Other °Pickles and olives. Salted popcorn and pretzels. °The items listed above may not be a complete list of foods and beverages to avoid. Contact your dietitian for more information. °WHERE CAN I FIND MORE INFORMATION? °National Heart, [employer], and [employer] Institute: www.nhlbi.nih.gov/health/health-topics/topics/dash/ °Document Released: 09/25/2011 Document Revised: 02/20/2014 Document Reviewed: 08/10/2013 °Exit[employer]® Patient Information ©2015 [employer]. This information is not intended to replace advice given to you by your health care provider. Make sure you discuss any questions you have with your health care provider. ° °

## 2014-07-25 NOTE — Assessment & Plan Note (Signed)
I have personally reviewed the Medicare Annual Wellness questionnaire and have noted 1. The patient's medical and social history 2. Their use of alcohol, tobacco or illicit drugs 3. Their current medications and supplements 4. The patient's functional ability including ADL's, fall risks, home safety risks and hearing or visual             impairment. 5. Diet and physical activities 6. Evidence for depression or mood disorders  The patients weight, height, BMI and visual acuity have been recorded in the chart I have made referrals, counseling and provided education to the patient based review of the above and I have provided the pt with a written personalized care plan for preventive services.  I have provided you with a copy of your personalized plan for preventive services. Please take the time to review along with your updated medication list.  prevnar and flu today No PSA due to age Should be done with colonoscopies now

## 2014-07-25 NOTE — Addendum Note (Signed)
Addended by: Weyman Pedro C on: 07/25/2014 11:40 AM   Modules accepted: Orders

## 2014-07-26 LAB — LIPID PANEL
CHOL/HDL RATIO: 5
CHOLESTEROL: 153 mg/dL (ref 0–200)
HDL: 32.4 mg/dL — AB (ref >39.00)
LDL CALC: 96 mg/dL (ref 0–99)
NonHDL: 120.6
TRIGLYCERIDES: 123 mg/dL (ref 0.0–149.0)
VLDL: 24.6 mg/dL (ref 0.0–40.0)

## 2014-07-26 LAB — COMPREHENSIVE METABOLIC PANEL
ALT: 29 U/L (ref 0–53)
AST: 35 U/L (ref 0–37)
Albumin: 4.1 g/dL (ref 3.5–5.2)
Alkaline Phosphatase: 79 U/L (ref 39–117)
BUN: 20 mg/dL (ref 6–23)
CALCIUM: 10 mg/dL (ref 8.4–10.5)
CHLORIDE: 106 meq/L (ref 96–112)
CO2: 23 meq/L (ref 19–32)
Creatinine, Ser: 1.4 mg/dL (ref 0.4–1.5)
GFR: 52.49 mL/min — AB (ref >60.00)
Glucose, Bld: 112 mg/dL — ABNORMAL HIGH (ref 70–99)
Potassium: 5.1 mEq/L (ref 3.5–5.1)
Sodium: 139 mEq/L (ref 135–145)
Total Bilirubin: 0.8 mg/dL (ref 0.2–1.2)
Total Protein: 7.9 g/dL (ref 6.0–8.3)

## 2014-07-26 LAB — T4, FREE: Free T4: 0.98 ng/dL (ref 0.60–1.60)

## 2015-02-06 ENCOUNTER — Other Ambulatory Visit: Payer: Self-pay | Admitting: *Deleted

## 2015-02-06 MED ORDER — LOSARTAN POTASSIUM 100 MG PO TABS: 100.0000 mg | ORAL_TABLET | Freq: Every day | ORAL | Status: DC

## 2015-02-06 MED ORDER — METOPROLOL SUCCINATE ER 50 MG PO TB24: 50.0000 mg | ORAL_TABLET | Freq: Every day | ORAL | Status: DC

## 2015-02-06 MED ORDER — CLOPIDOGREL BISULFATE 75 MG PO TABS: 75.0000 mg | ORAL_TABLET | Freq: Every day | ORAL | Status: DC

## 2015-02-06 MED ORDER — ROSUVASTATIN CALCIUM 10 MG PO TABS: 10.0000 mg | ORAL_TABLET | Freq: Every day | ORAL | Status: DC

## 2015-02-06 NOTE — Telephone Encounter (Signed)
rx sent to pharmacy by e-script  

## 2015-02-12 ENCOUNTER — Telehealth: Payer: Self-pay

## 2015-02-12 NOTE — Telephone Encounter (Signed)
He is feeling better now--dizziness is better but still has sense of fullness in head He felt a motion component and had sensation that this was inner ear Then the increase in BP and heart rate may have been secondary to vertigo symptoms  Advised to stick with his regular blood pressure meds He will check daily for a few days Recommended meclizine if the dizziness returns

## 2015-02-12 NOTE — Telephone Encounter (Signed)
Pt is feeling OK today but pt having problem with BP spiking up. 02/08/15 BP spiked up to BP 190/94 and pt was slightly dizzy most of day. 02/09/15 BP was 180/86. Since 02/08/15 pt has been taking ASA 325 mg daily and on 04/21 and 04/22 pt took losartan 100 mg and metoprolol 50 mg 1 1/2 tabs each. Pt is now taking losartan and metroprol one daily as previously instructed.Today BP is 130/76. Today pt said he is not dizzy but head feels swollen, mind is clear and pt is walking slower than usual(if tries to walk normal pace gets off balance). No H/A,CP or SOB or dizziness today. Pt request cb from Dr Silvio Pate. Pt had annual exam 07/25/2014.

## 2015-03-15 ENCOUNTER — Encounter: Payer: Self-pay | Admitting: Internal Medicine

## 2015-08-14 ENCOUNTER — Encounter: Payer: Self-pay | Admitting: Internal Medicine

## 2015-08-14 ENCOUNTER — Ambulatory Visit (INDEPENDENT_AMBULATORY_CARE_PROVIDER_SITE_OTHER): Payer: Medicare Other | Admitting: Internal Medicine

## 2015-08-14 VITALS — BP 138/70 | HR 58 | Temp 98.4°F | Ht 69.0 in | Wt 209.0 lb

## 2015-08-14 DIAGNOSIS — I2510 Atherosclerotic heart disease of native coronary artery without angina pectoris: Secondary | ICD-10-CM | POA: Diagnosis not present

## 2015-08-14 DIAGNOSIS — I70209 Unspecified atherosclerosis of native arteries of extremities, unspecified extremity: Secondary | ICD-10-CM

## 2015-08-14 DIAGNOSIS — L570 Actinic keratosis: Secondary | ICD-10-CM | POA: Diagnosis not present

## 2015-08-14 DIAGNOSIS — I10 Essential (primary) hypertension: Secondary | ICD-10-CM

## 2015-08-14 DIAGNOSIS — Z0000 Encounter for general adult medical examination without abnormal findings: Secondary | ICD-10-CM | POA: Diagnosis not present

## 2015-08-14 DIAGNOSIS — E785 Hyperlipidemia, unspecified: Secondary | ICD-10-CM

## 2015-08-14 DIAGNOSIS — R7301 Impaired fasting glucose: Secondary | ICD-10-CM

## 2015-08-14 DIAGNOSIS — Z7189 Other specified counseling: Secondary | ICD-10-CM

## 2015-08-14 DIAGNOSIS — Z23 Encounter for immunization: Secondary | ICD-10-CM | POA: Diagnosis not present

## 2015-08-14 LAB — COMPREHENSIVE METABOLIC PANEL
ALT: 18 U/L (ref 0–53)
AST: 20 U/L (ref 0–37)
Albumin: 3.8 g/dL (ref 3.5–5.2)
Alkaline Phosphatase: 80 U/L (ref 39–117)
BUN: 17 mg/dL (ref 6–23)
CALCIUM: 9.7 mg/dL (ref 8.4–10.5)
CHLORIDE: 106 meq/L (ref 96–112)
CO2: 29 meq/L (ref 19–32)
Creatinine, Ser: 1.34 mg/dL (ref 0.40–1.50)
GFR: 54.6 mL/min — AB (ref >60.00)
Glucose, Bld: 125 mg/dL — ABNORMAL HIGH (ref 70–99)
Potassium: 4.3 mEq/L (ref 3.5–5.1)
Sodium: 141 mEq/L (ref 135–145)
Total Bilirubin: 0.5 mg/dL (ref 0.2–1.2)
Total Protein: 7.2 g/dL (ref 6.0–8.3)

## 2015-08-14 LAB — LIPID PANEL
CHOL/HDL RATIO: 5
Cholesterol: 142 mg/dL (ref 0–200)
HDL: 31.3 mg/dL — ABNORMAL LOW (ref >39.00)
LDL CALC: 85 mg/dL (ref 0–99)
NONHDL: 110.24
TRIGLYCERIDES: 127 mg/dL (ref 0.0–149.0)
VLDL: 25.4 mg/dL (ref 0.0–40.0)

## 2015-08-14 LAB — HEMOGLOBIN A1C: Hgb A1c MFr Bld: 6.8 % — ABNORMAL HIGH (ref 4.6–6.5)

## 2015-08-14 LAB — CBC WITH DIFFERENTIAL/PLATELET
BASOS PCT: 0.5 % (ref 0.0–3.0)
Basophils Absolute: 0.1 10*3/uL (ref 0.0–0.1)
Eosinophils Absolute: 0.2 10*3/uL (ref 0.0–0.7)
Eosinophils Relative: 2 % (ref 0.0–5.0)
HEMATOCRIT: 43.6 % (ref 39.0–52.0)
HEMOGLOBIN: 14.6 g/dL (ref 13.0–17.0)
LYMPHS PCT: 28.1 % (ref 12.0–46.0)
Lymphs Abs: 3 10*3/uL (ref 0.7–4.0)
MCHC: 33.6 g/dL (ref 30.0–36.0)
MCV: 97 fl (ref 78.0–100.0)
MONOS PCT: 9 % (ref 3.0–12.0)
Monocytes Absolute: 1 10*3/uL (ref 0.1–1.0)
NEUTROS ABS: 6.5 10*3/uL (ref 1.4–7.7)
Neutrophils Relative %: 60.4 % (ref 43.0–77.0)
PLATELETS: 160 10*3/uL (ref 150.0–400.0)
RBC: 4.49 Mil/uL (ref 4.22–5.81)
RDW: 15.3 % (ref 11.5–15.5)
WBC: 10.8 10*3/uL — AB (ref 4.0–10.5)

## 2015-08-14 NOTE — Assessment & Plan Note (Signed)
Will recheck labs He has to work harder on lifestyle

## 2015-08-14 NOTE — Assessment & Plan Note (Signed)
Chronic stable claudication On plavix

## 2015-08-14 NOTE — Progress Notes (Signed)
Subjective:    Patient ID: Brandon Lewis, male    DOB: [birth year], 81 y.o.   MRN: [id number]  HPI Here for Medicare wellness visit and follow up of chronic medical conditions Reviewed form and advanced directives Reviewed other doctors--- needs new eye doctor since his brother retired. Dentist-- Dr Thereasa Parkin No tobacco Rare drink of alcohol Not exercising regularly. Stays active-- golf, etc Vision fine. Some hearing loss--has aides but doesn't always wear them (need repairs) No falls No depression or anhedonia Independent with instrumental ADLs No apparent cognitive changes  He feels well Some aches and joint pains--nothing bad Uses OTC naproxen occasionally--which is helpful Has had a good year  Still gets claudication in both legs when he walks This seems stable Able to do okay when golfing--but needs the cart No foot ulcers or skin problems  No chest pain Had 1 episode of palpitations with Meniere's flare up--none since No dizziness or syncope No edema  Tolerating crestor Told his plan won't cover it-- but wants to stay on it No myalgias on this--did have problems with other statin  Current Outpatient Prescriptions on File Prior to Visit  Medication Sig Dispense Refill  . aspirin 81 MG EC tablet Take 81 mg by mouth daily.      . cholecalciferol (VITAMIN D) 1000 UNITS tablet Take 1,000 Units by mouth daily.    . clopidogrel (PLAVIX) 75 MG tablet Take 1 tablet (75 mg total) by mouth daily. 30 tablet 11  . fluticasone (FLONASE) 50 MCG/ACT nasal spray Place 2 sprays into the nose daily as needed.     Marland Kitchen HYDROcodone-acetaminophen (NORCO/VICODIN) 5-325 MG per tablet Take 1 tablet by mouth 3 (three) times daily as needed. 30 tablet 0  . losartan (COZAAR) 100 MG tablet Take 1 tablet (100 mg total) by mouth daily. 30 tablet 11  . metoprolol succinate (TOPROL-XL) 50 MG 24 hr tablet Take 1 tablet (50 mg total) by mouth daily. 30 tablet 11  . Multiple Vitamin (MULTIVITAMIN)  tablet Take 1 tablet by mouth daily.      . Naproxen Sodium 220 MG CAPS Take 220-440 mg by mouth 2 (two) times daily as needed.    . rosuvastatin (CRESTOR) 10 MG tablet Take 1 tablet (10 mg total) by mouth daily. 30 tablet 11  . sildenafil (REVATIO) 20 MG tablet Take 3-5 tablets (60-100 mg total) by mouth daily as needed. 50 tablet 11   No current facility-administered medications on file prior to visit.    Allergies  Allergen Reactions  . Cilostazol     REACTION: stomach problems- constipation  . Simvastatin     REACTION: myalgia    Past Medical History  Diagnosis Date  . HTN (hypertension)   . PVD (peripheral vascular disease) ([place])     s/p aortobifemoral bypass, with bilateral SFA occlusion and intermittent claudication   . Osteoarthritis   . Small bowel obstruction ([place])   . Diverticulosis   . CAD (coronary artery disease)     s/p PCI after presenting with exertional angina (drug eluting stent)  . Hyperlipidemia   . GERD (gastroesophageal reflux disease)     Past Surgical History  Procedure Laterality Date  . Cholecystectomy  2002  . Aorto-bifem  2003    [facility]   . Carotid stent      [facility]    Family History  Problem Relation Age of Onset  . Coronary artery disease Paternal Grandfather   . Diabetes Father     DM - and brother   .  Colon cancer Neg Hx   . Prostate cancer Neg Hx     Social History   Social History  . Marital Status: Married    Spouse Name: N/A  . Number of Children: 3  . Years of Education: N/A   Occupational History  . 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 Smoking status: Former Smoker    Types: Cigarettes    Quit date: 10/20/1992  . Smokeless tobacco: Never Used     Comment: quit in 1994   . Alcohol Use: Yes     Comment: rare  . Drug Use: No  . Sexual Activity: Not on file   Other Topics Concern  . Not on file   Social History Narrative   Widowed; then remarried; 3 sons.     Consultant pharmacist.       Has a living will - wife has health care POA    Would want resuscitation attempts   Would accept brief trial of artificial nutrition      Pt signed designated party release form and gives Pau Banh [phone] (home #), access to medical records. Can also leave msg on home answering machine. Cell # [phone]   Review of Systems Sleeps well Appetite is fine Weight is stable Wears seat belt Teeth are fine--keeps up with dentist Bowels are fine No skin problems except for persistent spot on right side of nose    Objective:   Physical Exam  Constitutional: He is oriented to person, place, and time. He appears well-developed and well-nourished. No distress.  HENT:  Mouth/Throat: Oropharynx is clear and moist. No oropharyngeal exudate.  Neck: Normal range of motion. Neck supple. No thyromegaly present.  Cardiovascular: Normal rate, regular rhythm and normal heart sounds.  Exam reveals no gallop.   No murmur heard. No pedal pulses  Pulmonary/Chest: Effort normal and breath sounds normal. No respiratory distress. He has no wheezes. He has no rales.  Abdominal: Soft. There is no tenderness.  Musculoskeletal: He exhibits no edema or tenderness.  Lymphadenopathy:    He has no cervical adenopathy.  Neurological: He is alert and oriented to person, place, and time.  President-- "Elyn Peers, [place], [place]" [phone] D-l-r-o-w Recall 3/3  Skin: No rash noted. No erythema.  51mm actinic on right side of nose  Psychiatric: He has a normal mood and affect. His behavior is normal.          Assessment & Plan:

## 2015-08-14 NOTE — Assessment & Plan Note (Signed)
I have personally reviewed the Medicare Annual Wellness questionnaire and have noted 1. The patient's medical and social history 2. Their use of alcohol, tobacco or illicit drugs 3. Their current medications and supplements 4. The patient's functional ability including ADL's, fall risks, home safety risks and hearing or visual             impairment. 5. Diet and physical activities 6. Evidence for depression or mood disorders  The patients weight, height, BMI and visual acuity have been recorded in the chart I have made referrals, counseling and provided education to the patient based review of the above and I have provided the pt with a written personalized care plan for preventive services.  I have provided you with a copy of your personalized plan for preventive services. Please take the time to review along with your updated medication list.  Flu vaccine today No cancer screening due to age Discussed exercise 

## 2015-08-14 NOTE — Assessment & Plan Note (Signed)
Liquid nitrogen for 45 seconds x 2 Tolerated well If persists, will need to see derm

## 2015-08-14 NOTE — Addendum Note (Signed)
Addended by: Despina Hidden on: 08/14/2015 11:47 AM   Modules accepted: Orders

## 2015-08-14 NOTE — Assessment & Plan Note (Signed)
Tolerates the rosuvastatin

## 2015-08-14 NOTE — Assessment & Plan Note (Signed)
See social history 

## 2015-08-14 NOTE — Assessment & Plan Note (Signed)
No angina On appropriate secondary prevention

## 2015-08-14 NOTE — Progress Notes (Signed)
Pre visit review using our clinic review tool, if applicable. No additional management support is needed unless otherwise documented below in the visit note. 

## 2015-08-14 NOTE — Assessment & Plan Note (Signed)
BP Readings from Last 3 Encounters:  08/14/15 138/70  07/25/14 122/74  01/17/14 118/70   Good control Due for labs

## 2015-10-21 HISTORY — PX: CATARACT EXTRACTION, BILATERAL: SHX1313

## 2016-01-03 DIAGNOSIS — H35342 Macular cyst, hole, or pseudohole, left eye: Secondary | ICD-10-CM | POA: Diagnosis not present

## 2016-01-03 DIAGNOSIS — H25813 Combined forms of age-related cataract, bilateral: Secondary | ICD-10-CM | POA: Diagnosis not present

## 2016-01-03 DIAGNOSIS — H02834 Dermatochalasis of left upper eyelid: Secondary | ICD-10-CM | POA: Diagnosis not present

## 2016-01-21 DIAGNOSIS — H2512 Age-related nuclear cataract, left eye: Secondary | ICD-10-CM | POA: Diagnosis not present

## 2016-03-10 DIAGNOSIS — H25811 Combined forms of age-related cataract, right eye: Secondary | ICD-10-CM | POA: Diagnosis not present

## 2016-03-10 DIAGNOSIS — H2511 Age-related nuclear cataract, right eye: Secondary | ICD-10-CM | POA: Diagnosis not present

## 2016-05-14 ENCOUNTER — Other Ambulatory Visit: Payer: Self-pay | Admitting: Internal Medicine

## 2016-05-14 DIAGNOSIS — Z961 Presence of intraocular lens: Secondary | ICD-10-CM | POA: Diagnosis not present

## 2016-05-15 NOTE — Telephone Encounter (Signed)
Approved:#[phone] x 11  Please have someone address this--it looks like these get routed to me directly, which should never by happening!!

## 2016-08-21 ENCOUNTER — Ambulatory Visit (INDEPENDENT_AMBULATORY_CARE_PROVIDER_SITE_OTHER): Payer: Medicare Other | Admitting: Internal Medicine

## 2016-08-21 ENCOUNTER — Encounter: Payer: Self-pay | Admitting: Internal Medicine

## 2016-08-21 VITALS — BP 98/60 | HR 59 | Temp 97.5°F | Ht 69.0 in | Wt 195.0 lb

## 2016-08-21 DIAGNOSIS — I2510 Atherosclerotic heart disease of native coronary artery without angina pectoris: Secondary | ICD-10-CM

## 2016-08-21 DIAGNOSIS — I10 Essential (primary) hypertension: Secondary | ICD-10-CM

## 2016-08-21 DIAGNOSIS — I70209 Unspecified atherosclerosis of native arteries of extremities, unspecified extremity: Secondary | ICD-10-CM

## 2016-08-21 DIAGNOSIS — Z0000 Encounter for general adult medical examination without abnormal findings: Secondary | ICD-10-CM | POA: Diagnosis not present

## 2016-08-21 DIAGNOSIS — Z23 Encounter for immunization: Secondary | ICD-10-CM

## 2016-08-21 DIAGNOSIS — R7301 Impaired fasting glucose: Secondary | ICD-10-CM

## 2016-08-21 DIAGNOSIS — Z7189 Other specified counseling: Secondary | ICD-10-CM

## 2016-08-21 DIAGNOSIS — M159 Polyosteoarthritis, unspecified: Secondary | ICD-10-CM

## 2016-08-21 MED ORDER — METRONIDAZOLE 0.75 % EX GEL: 1.0000 "application " | Freq: Two times a day (BID) | CUTANEOUS | 11 refills | Status: DC

## 2016-08-21 MED ORDER — HYDROCODONE-ACETAMINOPHEN 5-325 MG PO TABS: 1.0000 | ORAL_TABLET | Freq: Three times a day (TID) | ORAL | 0 refills | Status: DC | PRN

## 2016-08-21 MED ORDER — SILDENAFIL CITRATE 20 MG PO TABS: ORAL_TABLET | ORAL | 11 refills | Status: DC

## 2016-08-21 NOTE — Assessment & Plan Note (Signed)
BP Readings from Last 3 Encounters:  08/21/16 98/60  08/14/15 138/70  07/25/14 122/74   Good control

## 2016-08-21 NOTE — Assessment & Plan Note (Signed)
Rare naproxen and hydrocodone

## 2016-08-21 NOTE — Progress Notes (Signed)
Subjective:    Patient ID: Brandon Lewis, male    DOB: 02/12/1934, 82 y.o.   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 aspirin 81 MG EC tablet Take 81 mg by mouth daily.      . cholecalciferol (VITAMIN D) 1000 UNITS tablet Take 1,000 Units by mouth daily.    . clopidogrel (PLAVIX) 75 MG tablet Take 1 tablet (75 mg total) by mouth daily. 30 tablet 11  . fluticasone (FLONASE) 50 MCG/ACT nasal spray Place 2 sprays into the nose daily as needed.     Marland Kitchen losartan (COZAAR) 100 MG tablet Take 1 tablet (100 mg total) by mouth daily. 30 tablet 11  . metoprolol succinate (TOPROL-XL) 50 MG 24 hr tablet Take 1 tablet (50 mg total) by mouth daily. 30 tablet 11  . Multiple Vitamin (MULTIVITAMIN) tablet Take 1 tablet by mouth daily.      . Naproxen Sodium 220 MG CAPS Take 220-440 mg by mouth 2 (two) times daily as needed.    . rosuvastatin (CRESTOR) 10 MG tablet Take 1 tablet (10 mg total) by mouth daily. 30 tablet 11   No current facility-administered medications on file prior to visit.      Allergies  Allergen Reactions  . Cilostazol     REACTION: stomach problems- constipation  . Simvastatin     REACTION: myalgia    Past Medical History:  Diagnosis Date  . CAD (coronary artery disease)    s/p PCI after presenting with exertional angina (drug eluting stent)  . Diverticulosis   . GERD (gastroesophageal reflux disease)   . HTN (hypertension)   . Hyperlipidemia   . Osteoarthritis   . PVD (peripheral vascular disease) ([place])    s/p aortobifemoral bypass, with bilateral SFA occlusion and intermittent claudication   . Small bowel obstruction     Past Surgical History:  Procedure Laterality Date  . aorto-bifem  2003   [facility]   . CAROTID STENT     [facility]  . CATARACT EXTRACTION, BILATERAL  2017  . CHOLECYSTECTOMY  2002    Family History  Problem Relation Age of Onset  . Diabetes Father     DM - and brother   . Coronary artery disease Paternal Grandfather   . Colon cancer Neg Hx   . Prostate cancer Neg Hx     Social History   Social History  . Marital status: Married    Spouse name: N/A  . Number of children: 3  . Years of education: N/A   Occupational History  . 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 Smoking status: Former Smoker    Types: Cigarettes    Quit date: 10/20/1992  . Smokeless tobacco: Never Used     Comment: quit in 1994   . Alcohol use Yes     Comment: rare  . Drug use: No  . Sexual activity: Not on file   Other Topics Concern  . Not on file   Social History Narrative   Widowed; then remarried; 3 sons.   Consultant pharmacist.       Has a living will - wife has health care POA    Would want resuscitation attempts   Would accept brief trial of artificial nutrition      Pt signed designated party release form and gives Bertrand Brosnan [phone] (home #), access to medical records. Can also leave msg on home answering machine. Cell # [phone]   Review of Systems Has been  eating better--has lost 14# since last year. Avoiding sugar Hip, back and hand arthritis Not using naproxen much---very rare Also rare using the hydrocodone--but likes having it Sleeps great Wears seat belt Teeth are generally okay--has broken tooth now (sees Dr Thereasa Parkin) Bowels are fine. No blood Voids okay--- stable nocturia x 2. No daytime issues No skin rash or suspicious lesions. Has rosacea--wonders about metrogel    Objective:   Physical Exam  Constitutional: He is oriented to person, place, and time. He appears well-developed and well-nourished. No distress.  HENT:  Mouth/Throat: Oropharynx is clear and moist. No oropharyngeal exudate.  Neck: Normal range of motion. Neck supple. No thyromegaly present.  Cardiovascular: Normal rate, regular rhythm and normal heart sounds.   Feet slightly cool and no pulses  Pulmonary/Chest: Effort normal and breath sounds normal. No respiratory distress. He has no wheezes. He has no rales.  Abdominal: Soft. There is no tenderness.  Musculoskeletal: He exhibits no edema or tenderness.  Lymphadenopathy:    He has no cervical adenopathy.  Neurological: He is alert and oriented to person, place, and time.  President-- "Daisy Floro, [place], [place]" [phone] D-l-r-o-w Recall 3/3  Skin: No rash noted. No erythema.  Typical rosacea--mild  Psychiatric: He has a normal mood and affect. His behavior is normal.          Assessment & Plan:

## 2016-08-21 NOTE — Assessment & Plan Note (Signed)
Has been quiet On appropriate secondary prevention

## 2016-08-21 NOTE — Assessment & Plan Note (Signed)
See social history 

## 2016-08-21 NOTE — Assessment & Plan Note (Signed)
Will plan to start metformin if still sig elevation

## 2016-08-21 NOTE — Assessment & Plan Note (Signed)
I have personally reviewed the Medicare Annual Wellness questionnaire and have noted 1. The patient's medical and social history 2. Their use of alcohol, tobacco or illicit drugs 3. Their current medications and supplements 4. The patient's functional ability including ADL's, fall risks, home safety risks and hearing or visual             impairment. 5. Diet and physical activities 6. Evidence for depression or mood disorders  The patients weight, height, BMI and visual acuity have been recorded in the chart I have made referrals, counseling and provided education to the patient based review of the above and I have provided the pt with a written personalized care plan for preventive services.  I have provided you with a copy of your personalized plan for preventive services. Please take the time to review along with your updated medication list.  No cancer screening due to age Flu vaccine today Working on fitness

## 2016-08-21 NOTE — Addendum Note (Signed)
Addended by: Pilar Grammes on: 08/21/2016 04:50 PM   Modules accepted: Orders

## 2016-08-21 NOTE — Assessment & Plan Note (Signed)
Stable bilateral claudication

## 2016-08-22 LAB — CBC WITH DIFFERENTIAL/PLATELET
BASOS PCT: 0.6 % (ref 0.0–3.0)
Basophils Absolute: 0.1 10*3/uL (ref 0.0–0.1)
EOS PCT: 1.4 % (ref 0.0–5.0)
Eosinophils Absolute: 0.2 10*3/uL (ref 0.0–0.7)
HCT: 41 % (ref 39.0–52.0)
Hemoglobin: 13.7 g/dL (ref 13.0–17.0)
LYMPHS ABS: 2.8 10*3/uL (ref 0.7–4.0)
Lymphocytes Relative: 25.8 % (ref 12.0–46.0)
MCHC: 33.4 g/dL (ref 30.0–36.0)
MCV: 100.8 fl — ABNORMAL HIGH (ref 78.0–100.0)
MONO ABS: 0.8 10*3/uL (ref 0.1–1.0)
Monocytes Relative: 7.3 % (ref 3.0–12.0)
NEUTROS PCT: 64.9 % (ref 43.0–77.0)
Neutro Abs: 7 10*3/uL (ref 1.4–7.7)
Platelets: 161 10*3/uL (ref 150.0–400.0)
RBC: 4.07 Mil/uL — ABNORMAL LOW (ref 4.22–5.81)
RDW: 17.9 % — AB (ref 11.5–15.5)
WBC: 10.8 10*3/uL — ABNORMAL HIGH (ref 4.0–10.5)

## 2016-08-22 LAB — COMPREHENSIVE METABOLIC PANEL
ALK PHOS: 70 U/L (ref 39–117)
ALT: 15 U/L (ref 0–53)
AST: 16 U/L (ref 0–37)
Albumin: 3.9 g/dL (ref 3.5–5.2)
BUN: 21 mg/dL (ref 6–23)
CHLORIDE: 109 meq/L (ref 96–112)
CO2: 26 mEq/L (ref 19–32)
Calcium: 9.6 mg/dL (ref 8.4–10.5)
Creatinine, Ser: 1.37 mg/dL (ref 0.40–1.50)
GFR: 53.09 mL/min — ABNORMAL LOW (ref >60.00)
GLUCOSE: 91 mg/dL (ref 70–99)
POTASSIUM: 5.2 meq/L — AB (ref 3.5–5.1)
SODIUM: 141 meq/L (ref 135–145)
TOTAL PROTEIN: 7.2 g/dL (ref 6.0–8.3)
Total Bilirubin: 0.5 mg/dL (ref 0.2–1.2)

## 2016-08-22 LAB — LIPID PANEL
Cholesterol: 144 mg/dL (ref 0–200)
HDL: 31.8 mg/dL — ABNORMAL LOW (ref >39.00)
LDL Cholesterol: 86 mg/dL (ref 0–99)
NONHDL: 111.88
Total CHOL/HDL Ratio: 5
Triglycerides: 129 mg/dL (ref 0.0–149.0)
VLDL: 25.8 mg/dL (ref 0.0–40.0)

## 2016-08-22 LAB — T4, FREE: FREE T4: 0.77 ng/dL (ref 0.60–1.60)

## 2016-08-22 LAB — HEMOGLOBIN A1C: HEMOGLOBIN A1C: 6.4 % (ref 4.6–6.5)

## 2016-09-26 ENCOUNTER — Other Ambulatory Visit: Payer: Self-pay

## 2016-11-29 ENCOUNTER — Ambulatory Visit (INDEPENDENT_AMBULATORY_CARE_PROVIDER_SITE_OTHER): Payer: Medicare Other | Admitting: Family Medicine

## 2016-11-29 ENCOUNTER — Encounter: Payer: Self-pay | Admitting: Family Medicine

## 2016-11-29 DIAGNOSIS — B349 Viral infection, unspecified: Secondary | ICD-10-CM | POA: Insufficient documentation

## 2016-11-29 NOTE — Progress Notes (Signed)
Pre visit review using our clinic review tool, if applicable. No additional management support is needed unless otherwise documented below in the visit note. 

## 2016-11-29 NOTE — Progress Notes (Signed)
Sx started about 10 days ago.  Nasal then chest congestion.  "I had a bad cold."  He is clearly getting better in the meantime, but given his other health issues he wanted to get checked.  He has been a little lightheaded recently.  His fever broke 2 days ago and he has felt better in the meantime.  Some cough and some sputum but it is clear.  Sick contact at home.  He is getting sputum up more easily now.    Meds, vitals, and allergies reviewed.   ROS: Per HPI unless specifically indicated in ROS section   GEN: nad, alert and oriented HEENT: mucous membranes moist, tm w/o erythema, nasal exam w/o erythema, clear discharge noted,  OP with cobblestoning NECK: supple w/o LA CV: rrr.   PULM: ctab, no inc wob EXT: no edema

## 2016-11-29 NOTE — Patient Instructions (Addendum)
Skip the next dose of losartan.  When you are feeling better, then restart 1/2 tab a day.  You can go back to a full tab when you feel better and if your BP is >140/>90.  Drink plenty of fluids, take tylenol as needed, and gargle with warm salt water for your throat.  This should gradually improve.  Take care.  Let us know if you have other concerns.

## 2016-11-29 NOTE — Assessment & Plan Note (Signed)
Likely benign viral process.   Clearly improved, nontoxic, okay for outpatient f/u. See AVS.   Hold losartan given BP today.  D/w pt.  See AVS.  He agrees.

## 2017-02-23 ENCOUNTER — Other Ambulatory Visit: Payer: Self-pay | Admitting: Internal Medicine

## 2017-03-27 ENCOUNTER — Encounter: Payer: Self-pay | Admitting: Internal Medicine

## 2017-03-27 ENCOUNTER — Ambulatory Visit (INDEPENDENT_AMBULATORY_CARE_PROVIDER_SITE_OTHER): Payer: Medicare Other | Admitting: Internal Medicine

## 2017-03-27 DIAGNOSIS — L570 Actinic keratosis: Secondary | ICD-10-CM

## 2017-03-27 NOTE — Progress Notes (Signed)
Subjective:    Patient ID: Brandon Lewis, male    DOB: 02-05-1934, 83 y.o.   MRN: [id number]  HPI Here due to concern for skin  Got blood blister on nose where I treated it --right bridge Healed well since Rx about 2 years (10/16) It broke on his pillow with considerable blood Scratched off scab multiple times Has healed over Now with persistent abnormal area in that spot--but not much  Current Outpatient Prescriptions on File Prior to Visit  Medication Sig Dispense Refill  . aspirin 81 MG EC tablet Take 81 mg by mouth daily.      . cholecalciferol (VITAMIN D) 1000 UNITS tablet Take 1,000 Units by mouth daily.    . clopidogrel (PLAVIX) 75 MG tablet TAKE ONE TABLET BY MOUTH EACH DAY. *BOTTLE* 30 tablet 5  . fluticasone (FLONASE) 50 MCG/ACT nasal spray Place 2 sprays into the nose daily as needed.     Marland Kitchen HYDROcodone-acetaminophen (NORCO/VICODIN) 5-325 MG tablet Take 1 tablet by mouth 3 (three) times daily as needed. 30 tablet 0  . losartan (COZAAR) 100 MG tablet TAKE ONE TABLET BY MOUTH EACH MORNING FOR BLOOD PRESSURE CONTROL. *BOTTLE* 30 tablet 5  . metoprolol succinate (TOPROL-XL) 50 MG 24 hr tablet TAKE (1) TABLET BY MOUTH ONCE A DAY.DO NOT CRUSH. (HIGH BLOOD PRESSURE) *BOTTLE* 30 tablet 5  . metroNIDAZOLE (METROGEL) 0.75 % gel Apply 1 application topically 2 (two) times daily. 45 g 11  . Multiple Vitamin (MULTIVITAMIN) tablet Take 1 tablet by mouth daily.      . Naproxen Sodium 220 MG CAPS Take 220-440 mg by mouth 2 (two) times daily as needed.    . rosuvastatin (CRESTOR) 10 MG tablet TAKE ONE TABLET BY MOUTH AT BEDTIME. (IMPROVES CHOLESTEROL) *BOTTLE* 30 tablet 5  . sildenafil (REVATIO) 20 MG tablet TAKE 3-5 TABLETS BY MOUTH DAILY AS NEEDED 50 tablet 11   No current facility-administered medications on file prior to visit.     Allergies  Allergen Reactions  . Cilostazol     REACTION: stomach problems- constipation  . Simvastatin     REACTION: myalgia    Past Medical  History:  Diagnosis Date  . CAD (coronary artery disease)    s/p PCI after presenting with exertional angina (drug eluting stent)  . Diverticulosis   . GERD (gastroesophageal reflux disease)   . HTN (hypertension)   . Hyperlipidemia   . Osteoarthritis   . PVD (peripheral vascular disease) ([place])    s/p aortobifemoral bypass, with bilateral SFA occlusion and intermittent claudication   . Small bowel obstruction [facility])     Past Surgical History:  Procedure Laterality Date  . aorto-bifem  2003   [facility]   . CAROTID STENT     [facility]  . CATARACT EXTRACTION, BILATERAL  2017  . CHOLECYSTECTOMY  2002    Family History  Problem Relation Age of Onset  . Diabetes Father        DM - and brother   . Coronary artery disease Paternal Grandfather   . Colon cancer Neg Hx   . Prostate cancer Neg Hx     Social History   Social History  . Marital status: Married    Spouse name: N/A  . Number of children: 3  . Years of education: N/A   Occupational History  . 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 Smoking status: Former Smoker    Types: Cigarettes  Quit date: 10/20/1992  . Smokeless tobacco: Never Used     Comment: quit in 1994   . Alcohol use Yes     Comment: rare  . Drug use: No  . Sexual activity: Not on file   Other Topics Concern  . Not on file   Social History Narrative   Widowed; then remarried; 3 sons.   Consultant pharmacist.       Has a living will - wife has health care POA    Would want resuscitation attempts   Would accept brief trial of artificial nutrition      Pt signed designated party release form and gives Brandon Lewis [phone] (home #), access to medical records. Can also leave msg on home answering machine. Cell # [phone]   Review of Systems  Wears hat Inconsistent with sunscreen Is feeling well Has gained some weight back     Objective:   Physical Exam  Constitutional: No distress.    Skin:  51mm nodular pink lesion on right side of nose No inflammation          Assessment & Plan:

## 2017-03-27 NOTE — Assessment & Plan Note (Signed)
Lesion is in same general location as one I froze in 10/16 It had healed completely for near 2 years so it is reasonable to try cryotherapy again  Cryotherapy 40 seconds x 2 Tolerated well Discussed home care

## 2017-03-27 NOTE — Patient Instructions (Signed)
If the spot comes back on your nose, you will need to have it removed by a dermatologist

## 2017-04-30 DIAGNOSIS — H26492 Other secondary cataract, left eye: Secondary | ICD-10-CM | POA: Diagnosis not present

## 2017-04-30 DIAGNOSIS — H02833 Dermatochalasis of right eye, unspecified eyelid: Secondary | ICD-10-CM | POA: Diagnosis not present

## 2017-04-30 DIAGNOSIS — H35342 Macular cyst, hole, or pseudohole, left eye: Secondary | ICD-10-CM | POA: Diagnosis not present

## 2017-05-21 ENCOUNTER — Telehealth: Payer: Self-pay

## 2017-05-21 NOTE — Telephone Encounter (Signed)
Pt wants cb from Dr Silvio Pate; pt said this was a personal call and Dr Silvio Pate could call back whenever he had time.

## 2017-05-21 NOTE — Telephone Encounter (Signed)
He had questions about his son's health--we discussed this

## 2017-08-24 ENCOUNTER — Ambulatory Visit (INDEPENDENT_AMBULATORY_CARE_PROVIDER_SITE_OTHER): Payer: Medicare Other | Admitting: Internal Medicine

## 2017-08-24 ENCOUNTER — Encounter: Payer: Self-pay | Admitting: Internal Medicine

## 2017-08-24 VITALS — BP 112/68 | HR 63 | Temp 98.0°F | Ht 69.0 in | Wt 199.0 lb

## 2017-08-24 DIAGNOSIS — I10 Essential (primary) hypertension: Secondary | ICD-10-CM

## 2017-08-24 DIAGNOSIS — Z0000 Encounter for general adult medical examination without abnormal findings: Secondary | ICD-10-CM | POA: Diagnosis not present

## 2017-08-24 DIAGNOSIS — I2510 Atherosclerotic heart disease of native coronary artery without angina pectoris: Secondary | ICD-10-CM | POA: Diagnosis not present

## 2017-08-24 DIAGNOSIS — R7301 Impaired fasting glucose: Secondary | ICD-10-CM | POA: Diagnosis not present

## 2017-08-24 DIAGNOSIS — D696 Thrombocytopenia, unspecified: Secondary | ICD-10-CM | POA: Diagnosis not present

## 2017-08-24 DIAGNOSIS — E785 Hyperlipidemia, unspecified: Secondary | ICD-10-CM

## 2017-08-24 DIAGNOSIS — Z7189 Other specified counseling: Secondary | ICD-10-CM

## 2017-08-24 DIAGNOSIS — Z23 Encounter for immunization: Secondary | ICD-10-CM

## 2017-08-24 DIAGNOSIS — I70209 Unspecified atherosclerosis of native arteries of extremities, unspecified extremity: Secondary | ICD-10-CM

## 2017-08-24 MED ORDER — SILDENAFIL CITRATE 20 MG PO TABS: ORAL_TABLET | ORAL | 11 refills | Status: DC

## 2017-08-24 MED ORDER — TETANUS-DIPHTHERIA TOXOIDS TD 5-2 LFU IM INJ: 0.5000 mL | INJECTION | Freq: Once | INTRAMUSCULAR | 0 refills | Status: AC

## 2017-08-24 NOTE — Addendum Note (Signed)
Addended by: Pilar Grammes on: 08/24/2017 05:46 PM   Modules accepted: Orders

## 2017-08-24 NOTE — Assessment & Plan Note (Signed)
BP Readings from Last 3 Encounters:  08/24/17 112/68  03/27/17 108/66  11/29/16 (!) 110/54   Good control

## 2017-08-24 NOTE — Assessment & Plan Note (Signed)
Has gained weight  Will consider metformin if really up

## 2017-08-24 NOTE — Progress Notes (Signed)
Subjective:    Patient ID: Brandon Lewis, male    DOB: 03/16/1934, 83 y.o.   MRN: [id number]  HPI Here for Medicare wellness visit and follow up of chronic health conditions Reviewed form and advanced directives Reviewed other doctors Rare drink of alcohol No tobacco  Not regularly exercising--discussed and he is considering joining Y No falls No depression or anhedonia Vision is good since the cataract surgery Hearing is not good--- inconsistent with his hearing aides Independent with instrumental ADLs No sig memory issues  Doing okay May be slowing down a little Gets soreness in joints if he does any work Uses naproxen prn for pain---not that often  No heart trouble No chest pain No SOB No palpitations No dizziness or syncope Still gets some leg pain if he pushes it--better if he stops No foot or leg ulcers  Has some concerns about the cholesterol med Does have some muscle soreness--but not clear that it is from this No GI problems  Current Outpatient Medications on File Prior to Visit  Medication Sig Dispense Refill  . aspirin 81 MG EC tablet Take 81 mg by mouth daily.      . cholecalciferol (VITAMIN D) 1000 UNITS tablet Take 1,000 Units by mouth daily.    . clopidogrel (PLAVIX) 75 MG tablet TAKE ONE TABLET BY MOUTH EACH DAY. *BOTTLE* 30 tablet 5  . fluticasone (FLONASE) 50 MCG/ACT nasal spray Place 2 sprays into the nose daily as needed.     Marland Kitchen losartan (COZAAR) 100 MG tablet TAKE ONE TABLET BY MOUTH EACH MORNING FOR BLOOD PRESSURE CONTROL. *BOTTLE* 30 tablet 5  . metoprolol succinate (TOPROL-XL) 50 MG 24 hr tablet TAKE (1) TABLET BY MOUTH ONCE A DAY.DO NOT CRUSH. (HIGH BLOOD PRESSURE) *BOTTLE* 30 tablet 5  . Multiple Vitamin (MULTIVITAMIN) tablet Take 1 tablet by mouth daily.      . Naproxen Sodium 220 MG CAPS Take 220-440 mg by mouth 2 (two) times daily as needed.    . rosuvastatin (CRESTOR) 10 MG tablet TAKE ONE TABLET BY MOUTH AT BEDTIME. (IMPROVES  CHOLESTEROL) *BOTTLE* 30 tablet 5  . sildenafil (REVATIO) 20 MG tablet TAKE 3-5 TABLETS BY MOUTH DAILY AS NEEDED 50 tablet 11   No current facility-administered medications on file prior to visit.     Allergies  Allergen Reactions  . Cilostazol     REACTION: stomach problems- constipation  . Simvastatin     REACTION: myalgia    Past Medical History:  Diagnosis Date  . CAD (coronary artery disease)    s/p PCI after presenting with exertional angina (drug eluting stent)  . Diverticulosis   . GERD (gastroesophageal reflux disease)   . HTN (hypertension)   . Hyperlipidemia   . Osteoarthritis   . PVD (peripheral vascular disease) ([address])    s/p aortobifemoral bypass, with bilateral SFA occlusion and intermittent claudication   . Small bowel obstruction [facility])     Past Surgical History:  Procedure Laterality Date  . aorto-bifem  2003   [facility]   . CAROTID STENT     [facility]  . CATARACT EXTRACTION, BILATERAL  2017  . CHOLECYSTECTOMY  2002    Family History  Problem Relation Age of Onset  . Diabetes Father        DM - and brother   . Coronary artery disease Paternal Grandfather   . Colon cancer Neg Hx   . Prostate cancer Neg Hx     Social History   Socioeconomic History  . Marital status:  Married    Spouse name: Not on file  . Number of children: 3  . Years of education: Not on file  . Highest education level: Not on file  Social Needs  . Financial resource strain: Not on file  . Food insecurity - worry: Not on file  . Food insecurity - inability: Not on file  . Transportation needs - medical: Not on file  . Transportation needs - non-medical: Not on file  Occupational History  . 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 Smoking status: Former Smoker    Types: Cigarettes    Last attempt to quit: 10/20/1992    Years since quitting: 24.8  . Smokeless tobacco: Never Used  . Tobacco comment: quit in 1994     Substance and Sexual Activity  . Alcohol use: Yes    Comment: rare  . Drug use: No  . Sexual activity: Not on file  Other Topics Concern  . Not on file  Social History Narrative   Widowed; then remarried; 3 sons.   Consultant pharmacist.       Has a living will - wife has health care POA    Would want resuscitation attempts   Would accept brief trial of artificial nutrition      Pt signed designated party release form and gives Brandon Lewis [phone] (home #), access to medical records. Can also leave msg on home answering machine. Cell # [phone]   Review of Systems Appetite is good Weight is up some from last year Wears seat belt Sleeps well No headaches Bowels are fine. No blood Voids okay. Stream fine. Satisfied with sildenafil still Some dry skin and non specific bumps.  Has spot on side of nose that I froze---occasionally bleeds (from over a year ago). Discussed seeing dermatologist Easy bruising Teeth fine--keeps up with dentist     Objective:   Physical Exam  Constitutional: He is oriented to person, place, and time. He appears well-nourished. No distress.  HENT:  Mouth/Throat: Oropharynx is clear and moist. No oropharyngeal exudate.  Neck: No thyromegaly present.  Cardiovascular: Normal rate, regular rhythm and normal heart sounds. Exam reveals no gallop.  No murmur heard. Feet cool and no pulses  Pulmonary/Chest: Effort normal and breath sounds normal. No respiratory distress. He has no wheezes. He has no rales.  Abdominal: Soft. He exhibits no distension. There is no tenderness. There is no rebound and no guarding.  Musculoskeletal: He exhibits no edema or tenderness.  Lymphadenopathy:    He has no cervical adenopathy.  Neurological: He is alert and oriented to person, place, and time.  President--- "Daisy Floro, Obama, Bush" [phone] [id number] Recall 3/3  Skin:  Irregular 62mm lesion on right side of nose (needs derm)  Psychiatric: He  has a normal mood and affect. His behavior is normal.          Assessment & Plan:

## 2017-08-24 NOTE — Assessment & Plan Note (Signed)
I have personally reviewed the Medicare Annual Wellness questionnaire and have noted 1. The patient's medical and social history 2. Their use of alcohol, tobacco or illicit drugs 3. Their current medications and supplements 4. The patient's functional ability including ADL's, fall risks, home safety risks and hearing or visual             impairment. 5. Diet and physical activities 6. Evidence for depression or mood disorders  The patients weight, height, BMI and visual acuity have been recorded in the chart I have made referrals, counseling and provided education to the patient based review of the above and I have provided the pt with a written personalized care plan for preventive services.  I have provided you with a copy of your personalized plan for preventive services. Please take the time to review along with your updated medication list.  Flu vaccine and pneumovax booster today Rx for Td Discussed shingrix---will consider in a while Needs to exercise No cancer screening due to age

## 2017-08-24 NOTE — Assessment & Plan Note (Signed)
Stable claudication On statin and clopidogrel

## 2017-08-24 NOTE — Progress Notes (Signed)
Hearing Screening Comments: Has hearing aids but not wearing them today Vision Screening Comments: June 2018

## 2017-08-24 NOTE — Patient Instructions (Addendum)
You can consider stopping the cholesterol medication for a month. If the muscle pain goes away, let me know and I will try you on a different medication. If there is no change, go back on the rosuvastatin. Please set up with a dermatologist.

## 2017-08-24 NOTE — Assessment & Plan Note (Signed)
No angina 

## 2017-08-24 NOTE — Assessment & Plan Note (Signed)
See social history 

## 2017-08-24 NOTE — Assessment & Plan Note (Signed)
Myalgia likely related to deconditioning and lack of exercise He will consider trial off just to tell

## 2017-08-25 DIAGNOSIS — D696 Thrombocytopenia, unspecified: Secondary | ICD-10-CM | POA: Insufficient documentation

## 2017-08-25 LAB — CBC
HEMATOCRIT: 40.7 % (ref 39.0–52.0)
HEMOGLOBIN: 13.4 g/dL (ref 13.0–17.0)
MCHC: 32.9 g/dL (ref 30.0–36.0)
MCV: 106.3 fl — AB (ref 78.0–100.0)
PLATELETS: 138 10*3/uL — AB (ref 150.0–400.0)
RBC: 3.83 Mil/uL — AB (ref 4.22–5.81)
RDW: 18.5 % — ABNORMAL HIGH (ref 11.5–15.5)
WBC: 9.1 10*3/uL (ref 4.0–10.5)

## 2017-08-25 LAB — COMPREHENSIVE METABOLIC PANEL
ALT: 14 U/L (ref 0–53)
AST: 19 U/L (ref 0–37)
Albumin: 3.9 g/dL (ref 3.5–5.2)
Alkaline Phosphatase: 86 U/L (ref 39–117)
BILIRUBIN TOTAL: 0.7 mg/dL (ref 0.2–1.2)
BUN: 19 mg/dL (ref 6–23)
CHLORIDE: 108 meq/L (ref 96–112)
CO2: 26 meq/L (ref 19–32)
Calcium: 9.9 mg/dL (ref 8.4–10.5)
Creatinine, Ser: 1.5 mg/dL (ref 0.40–1.50)
GFR: 47.69 mL/min — AB (ref >60.00)
GLUCOSE: 104 mg/dL — AB (ref 70–99)
POTASSIUM: 4.7 meq/L (ref 3.5–5.1)
Sodium: 139 mEq/L (ref 135–145)
Total Protein: 7.6 g/dL (ref 6.0–8.3)

## 2017-08-25 LAB — HEMOGLOBIN A1C: Hgb A1c MFr Bld: 6.5 % (ref 4.6–6.5)

## 2017-08-25 LAB — LIPID PANEL
CHOL/HDL RATIO: 5
Cholesterol: 152 mg/dL (ref 0–200)
HDL: 30.1 mg/dL — AB (ref >39.00)
LDL Cholesterol: 95 mg/dL (ref 0–99)
NONHDL: 121.82
Triglycerides: 132 mg/dL (ref 0.0–149.0)
VLDL: 26.4 mg/dL (ref 0.0–40.0)

## 2017-08-25 LAB — T4, FREE: Free T4: 0.82 ng/dL (ref 0.60–1.60)

## 2017-08-25 NOTE — Assessment & Plan Note (Signed)
Mild thrombocytopenia noted on his blood work No abnormal bleeding Will just plan to recheck next year

## 2017-09-28 ENCOUNTER — Other Ambulatory Visit: Payer: Self-pay | Admitting: Internal Medicine

## 2018-03-29 ENCOUNTER — Telehealth: Payer: Self-pay

## 2018-03-29 ENCOUNTER — Ambulatory Visit: Payer: Medicare Other | Admitting: Internal Medicine

## 2018-03-29 ENCOUNTER — Encounter: Payer: Self-pay | Admitting: Internal Medicine

## 2018-03-29 VITALS — BP 112/68 | HR 72 | Temp 98.3°F | Ht 69.0 in | Wt 205.0 lb

## 2018-03-29 DIAGNOSIS — J0110 Acute frontal sinusitis, unspecified: Secondary | ICD-10-CM | POA: Diagnosis not present

## 2018-03-29 NOTE — Telephone Encounter (Signed)
PLEASE NOTE: All timestamps contained within this report are represented as [place] Standard Time. CONFIDENTIALTY NOTICE: This fax transmission is intended only for the addressee. It contains information that is legally privileged, confidential or otherwise protected from use or disclosure. If you are not the intended recipient, you are strictly prohibited from reviewing, disclosing, copying using or disseminating any of this information or taking any action in reliance on or regarding this information. If you have received this fax in error, please notify us immediately by telephone so that we can arrange for its return to [place]. Phone: [phone], Toll-Free: [phone], Fax: [phone] Page: 1 of 2 Call Id: [id number] [facility] Patient Name: Brandon Lewis Gender: Male DOB: 06/14/1934 Age: 84 Y 11 M 19 D Return Phone Number: [phone] (Primary) Address: City/State/Zip: [place] [address] Client [facility] Night - Client Client Site [place] Physician Viviana Simpler - MD Contact Type Call Who Is Calling Patient / Member / Family / Caregiver Call Type Triage / Clinical Relationship To Patient Self Return Phone Number [phone] (Primary) Chief Complaint Headache Reason for Call Symptomatic / Request for [place] states has an upper respiratory tract infection, lot of mucus that is thick and yellow and his head is stopped up. Translation No Nurse Assessment Nurse: Lavera Guise, RN, Vaughan Basta Date/Time (Eastern Time): 03/27/2018 12:37:57 PM Confirm and document reason for call. If symptomatic, describe symptoms. ---Caller states has nasal congestion. Feels like he is in a barrel going through the mountains. Coughing up green mucus. Cough is intermittent. Using Mucinex. Not getting better. Has bad hearing.  No fever. Started 3 days ago. Does the patient have any new or worsening symptoms? ---Yes Will a triage be completed? ---Yes Related visit to physician within the last 2 weeks? ---No Does the PT have any chronic conditions? (i.e. diabetes, asthma, etc.) ---No Is this a behavioral health or substance abuse call? ---No Guidelines Guideline Title Affirmed Question Affirmed Notes Nurse Date/Time (Eastern Time) Cough - Acute Productive Cough with cold symptoms (e.g., runny nose, postnasal drip, throat clearing) Lavera Guise, RN, Linda 03/27/2018 12:41:52 PM Disp. Time Eilene Ghazi Time) Disposition Final User 03/27/2018 12:48:25 PM Home Care Yes Kluth, RN, Phineas Semen Disagree/Comply Comply Caller Understands Yes PLEASE NOTE: All timestamps contained within this report are represented as [place] Standard Time. CONFIDENTIALTY NOTICE: This fax transmission is intended only for the addressee. It contains information that is legally privileged, confidential or otherwise protected from use or disclosure. If you are not the intended recipient, you are strictly prohibited from reviewing, disclosing, copying using or disseminating any of this information or taking any action in reliance on or regarding this information. If you have received this fax in error, please notify us immediately by telephone so that we can arrange for its return to [place]. Phone: [phone], Toll-Free: [phone], Fax: [phone] Page: 2 of 2 Call Id: [id number] PreDisposition InappropriateToAsk Care Advice Given Per Guideline HOME CARE: You should be able to treat this at home. REASSURANCE AND EDUCATION: * It sounds like an uncomplicated cold that we can treat at home. * HOME REMEDY - HONEY: This old home remedy has been shown to help decrease coughing at night. The adult dosage is 2 teaspoons (10 ml) at bedtime. Honey should not be given to infants under one year of age. FOR A STUFFY NOSE - USE NASAL WASHES: * Introduction: Saline  (salt water) nasal  irrigation (nasal wash) is an effective and simple home remedy for treating stuffy nose and sinus congestion. The nose can be irrigated by pouring, spraying, or squirting salt water into the nose and then letting it run back out. * STEP 1: Lean over a sink. * STEP 2: Gently squirt or spray warm salt water into one of your nostrils. * STEP 3: Some of the water may run into the back of your throat. Spit this out. If you swallow the salt water it will not hurt you. * STEP 4: Blow your nose to clean out the water and mucus. * STEP 5: Repeat steps 1-4 for the other nostril. You can do this a couple times a day if it seems to help you. * Pseudoephedrine (Sudafed): Available over-the-counter in pill form. Typical adult dosage is two 30 mg tablets every 6 hours. * Oxymetazoline Nasal Drops (Afrin): Available over-the-counter. Clean out the nose before using. Spray each nostril once, wait one minute for absorption, and then spray a second time. NASAL DECONGESTANTS FOR A VERY STUFFY NOSE: CALL BACK IF: * Fever lasts over 3 days CARE ADVICE given per Cough - Acute Productive (Adult) guideline. * You become worse. * Nasal discharge lasts over 10 days * Earache or facial pain develops Comments User: Ricard Dillon, RN Date/Time (Eastern Time): 03/27/2018 12:50:56 PM Caller states he will wait until Monday and call the office then. Referrals [facility] [place]

## 2018-03-29 NOTE — Telephone Encounter (Signed)
Spoke to pt. He said he would like to have an antibiotic on hand in case he does not improve over week. Send to [facility]

## 2018-03-29 NOTE — Telephone Encounter (Signed)
Spoke to pt. He will call us by Thursday if he is not improving.

## 2018-03-29 NOTE — Telephone Encounter (Signed)
Okay Will assess at the [facility]

## 2018-03-29 NOTE — Progress Notes (Signed)
Subjective:    Patient ID: Brandon Lewis, male    DOB: 10-May-1934, 84 y.o.   MRN: [id number]  HPI Here due to respiratory infection  Has been congested for a while--but now left ear very congested Can't hear there at all Stopped up "and won't clear loose"  Facial pressure Some rhinorrhea Coughs at times--- brings up mucus (thick green sticky stuff). Brandon Lewis can tell it is post nasal drip Started 5-6 days ago No fever No SOB  Has tried Valsalva--no help Has used nasal saline rinses--- no clear help with frontal pressure but cleans out his sinuses some Using flonase also  Current Outpatient Medications on File Prior to Visit  Medication Sig Dispense Refill  . aspirin 81 MG EC tablet Take 81 mg by mouth daily.      . cholecalciferol (VITAMIN D) 1000 UNITS tablet Take 1,000 Units by mouth daily.    . clopidogrel (PLAVIX) 75 MG tablet TAKE ONE TABLET BY MOUTH EVERY DAY *BOTTLE* 90 tablet 3  . fluticasone (FLONASE) 50 MCG/ACT nasal spray Place 2 sprays into the nose daily as needed.     Marland Kitchen losartan (COZAAR) 100 MG tablet TAKE ONE TABLET BY MOUTH EACH MORNING FOR BLOOD PRESSURE CONTROL. *BOTTLE* 90 tablet 3  . metoprolol succinate (TOPROL-XL) 50 MG 24 hr tablet **VIAL ONLY** TAKE (1) TABLET BY MOUTH ONCE A DAY.DO NOT CRUSH. (HIGHBLOOD PRESSURE) *BOTTLE* 90 tablet 3  . Multiple Vitamin (MULTIVITAMIN) tablet Take 1 tablet by mouth daily.      . Naproxen Sodium 220 MG CAPS Take 220-440 mg by mouth 2 (two) times daily as needed.    . rosuvastatin (CRESTOR) 10 MG tablet **VIAL ONLY** TAKE ONE TABLET BY MOUTH AT BEDTIME. (IMPROVES CHOLESTEROL) 90 tablet 3  . sildenafil (REVATIO) 20 MG tablet TAKE 3-5 TABLETS BY MOUTH DAILY AS NEEDED 50 tablet 11   No current facility-administered medications on file prior to visit.     Allergies  Allergen Reactions  . Cilostazol     REACTION: stomach problems- constipation  . Simvastatin     REACTION: myalgia    Past Medical History:  Diagnosis  Date  . CAD (coronary artery disease)    s/p PCI after presenting with exertional angina (drug eluting stent)  . Diverticulosis   . GERD (gastroesophageal reflux disease)   . HTN (hypertension)   . Hyperlipidemia   . Osteoarthritis   . PVD (peripheral vascular disease) ([place])    s/p aortobifemoral bypass, with bilateral SFA occlusion and intermittent claudication   . Small bowel obstruction [facility])     Past Surgical History:  Procedure Laterality Date  . aorto-bifem  2003   [facility]   . CAROTID STENT     [facility]  . CATARACT EXTRACTION, BILATERAL  2017  . CHOLECYSTECTOMY  2002    Family History  Problem Relation Age of Onset  . Diabetes Father        DM - and brother   . Coronary artery disease Paternal Grandfather   . Colon cancer Neg Hx   . Prostate cancer Neg Hx     Social History   Socioeconomic History  . Marital status: Married    Spouse name: Not on file  . Number of children: 3  . Years of education: Not on file  . Highest education level: Not on file  Occupational History  . 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 Financial resource strain: Not on file  .  Food insecurity:    Worry: Not on file    Inability: Not on file  . Transportation needs:    Medical: Not on file    Non-medical: Not on file  Tobacco Use  . Smoking status: Former Smoker    Types: Cigarettes    Last attempt to quit: 10/20/1992    Years since quitting: 25.4  . Smokeless tobacco: Never Used  . Tobacco comment: quit in 1994   Substance and Sexual Activity  . Alcohol use: Yes    Comment: rare  . Drug use: No  . Sexual activity: Not on file  Lifestyle  . Physical activity:    Days per week: Not on file    Minutes per session: Not on file  . Stress: Not on file  Relationships  . Social connections:    Talks on phone: Not on file    Gets together: Not on file    Attends religious service: Not on file    Active member of club or  organization: Not on file    Attends meetings of clubs or organizations: Not on file    Relationship status: Not on file  . Intimate partner violence:    Fear of current or ex partner: Not on file    Emotionally abused: Not on file    Physically abused: Not on file    Forced sexual activity: Not on file  Other Topics Concern  . Not on file  Social History Narrative   Widowed; then remarried; 3 sons.   Consultant pharmacist.       Has a living will - wife has health care POA    Would want resuscitation attempts   Would accept brief trial of artificial nutrition      Brandon Lewis signed designated party release form and gives Perrin Gens [phone] (home #), access to medical records. Can also leave msg on home answering machine. Cell # [phone]   Review of Systems No rash No vomiting or diarrhea Eating fine    Objective:   Physical Exam  Constitutional: Brandon Lewis appears well-developed. No distress.  HENT:  Some frontal pressure but not really tenderness Mild nasal congestion Slight pharyngeal injection TMs obscured by cerumen deep in both canals  Neck: No thyromegaly present.  Respiratory: Effort normal and breath sounds normal. No respiratory distress. Brandon Lewis has no wheezes. Brandon Lewis has no rales.  Lymphadenopathy:    Brandon Lewis has no cervical adenopathy.           Assessment & Plan:

## 2018-03-29 NOTE — Telephone Encounter (Signed)
I will send it later this week if he is not improving (have him let me know by Thursday--I am off on Friday) Right now, not sure he needs it

## 2018-03-29 NOTE — Telephone Encounter (Signed)
I spoke with pt and scheduled appt with Dr Silvio Pate 03/29/18 at 2 pm; first appt that was convenient for pt.

## 2018-03-29 NOTE — Telephone Encounter (Signed)
-----   Message from Venia Carbon, MD sent at 03/29/2018  2:45 PM EDT ----- Just call him and let him know that I would try an antibiotic if his drainage and cough worsen as the week goes on

## 2018-03-29 NOTE — Assessment & Plan Note (Addendum)
His symptoms have been worsening and now with copious purulent drainage His main concern is his ear--which has cerumen After cleaning out, his hearing was back to normal and he felt better Discussed that if the drainage and cough worsen, would try an empiric antibiotic

## 2018-04-06 ENCOUNTER — Other Ambulatory Visit: Payer: Self-pay | Admitting: Internal Medicine

## 2018-04-12 DIAGNOSIS — C44310 Basal cell carcinoma of skin of unspecified parts of face: Secondary | ICD-10-CM | POA: Insufficient documentation

## 2018-07-29 ENCOUNTER — Telehealth: Payer: Self-pay | Admitting: Internal Medicine

## 2018-07-29 NOTE — Telephone Encounter (Signed)
Best number [phone] Pt needs to r/s his 11/8 cpx medicare.  He wife is have kidney stone surgery that day. He needs mon wed fri am  Can pt be worked in if so where Or Tuesday or Thursday PM

## 2018-07-29 NOTE — Telephone Encounter (Signed)
If you can find a spot with an open appt and a same day next to it, you can offer that.

## 2018-08-02 NOTE — Telephone Encounter (Signed)
Appointment 11/25 °Pt aware °

## 2018-08-27 ENCOUNTER — Encounter: Payer: Medicare Other | Admitting: Internal Medicine

## 2018-09-13 ENCOUNTER — Encounter: Payer: Self-pay | Admitting: Internal Medicine

## 2018-09-13 ENCOUNTER — Ambulatory Visit (INDEPENDENT_AMBULATORY_CARE_PROVIDER_SITE_OTHER): Payer: Medicare Other | Admitting: Internal Medicine

## 2018-09-13 VITALS — BP 112/78 | HR 68 | Temp 97.6°F | Ht 69.0 in | Wt 204.0 lb

## 2018-09-13 DIAGNOSIS — D696 Thrombocytopenia, unspecified: Secondary | ICD-10-CM

## 2018-09-13 DIAGNOSIS — Z23 Encounter for immunization: Secondary | ICD-10-CM | POA: Diagnosis not present

## 2018-09-13 DIAGNOSIS — Z0000 Encounter for general adult medical examination without abnormal findings: Secondary | ICD-10-CM | POA: Diagnosis not present

## 2018-09-13 DIAGNOSIS — N183 Chronic kidney disease, stage 3 unspecified: Secondary | ICD-10-CM

## 2018-09-13 DIAGNOSIS — I70209 Unspecified atherosclerosis of native arteries of extremities, unspecified extremity: Secondary | ICD-10-CM | POA: Diagnosis not present

## 2018-09-13 DIAGNOSIS — M25551 Pain in right hip: Secondary | ICD-10-CM | POA: Insufficient documentation

## 2018-09-13 DIAGNOSIS — I10 Essential (primary) hypertension: Secondary | ICD-10-CM | POA: Diagnosis not present

## 2018-09-13 DIAGNOSIS — Z7189 Other specified counseling: Secondary | ICD-10-CM

## 2018-09-13 DIAGNOSIS — E785 Hyperlipidemia, unspecified: Secondary | ICD-10-CM

## 2018-09-13 LAB — CBC
HEMATOCRIT: 38.4 % — AB (ref 39.0–52.0)
HEMOGLOBIN: 12.6 g/dL — AB (ref 13.0–17.0)
MCHC: 32.7 g/dL (ref 30.0–36.0)
MCV: 111.9 fl — ABNORMAL HIGH (ref 78.0–100.0)
PLATELETS: 177 10*3/uL (ref 150.0–400.0)
RBC: 3.43 Mil/uL — ABNORMAL LOW (ref 4.22–5.81)
RDW: 19.6 % — AB (ref 11.5–15.5)
WBC: 9.2 10*3/uL (ref 4.0–10.5)

## 2018-09-13 LAB — LIPID PANEL
CHOLESTEROL: 142 mg/dL (ref 0–200)
HDL: 31.2 mg/dL — ABNORMAL LOW (ref >39.00)
LDL Cholesterol: 89 mg/dL (ref 0–99)
NONHDL: 111.25
Total CHOL/HDL Ratio: 5
Triglycerides: 110 mg/dL (ref 0.0–149.0)
VLDL: 22 mg/dL (ref 0.0–40.0)

## 2018-09-13 LAB — COMPREHENSIVE METABOLIC PANEL
ALBUMIN: 4 g/dL (ref 3.5–5.2)
ALK PHOS: 77 U/L (ref 39–117)
ALT: 12 U/L (ref 0–53)
AST: 15 U/L (ref 0–37)
BUN: 23 mg/dL (ref 6–23)
CALCIUM: 9.6 mg/dL (ref 8.4–10.5)
CHLORIDE: 108 meq/L (ref 96–112)
CO2: 25 mEq/L (ref 19–32)
Creatinine, Ser: 1.44 mg/dL (ref 0.40–1.50)
GFR: 49.86 mL/min — ABNORMAL LOW (ref >60.00)
Glucose, Bld: 115 mg/dL — ABNORMAL HIGH (ref 70–99)
POTASSIUM: 4.5 meq/L (ref 3.5–5.1)
Sodium: 140 mEq/L (ref 135–145)
TOTAL PROTEIN: 7.7 g/dL (ref 6.0–8.3)
Total Bilirubin: 0.8 mg/dL (ref 0.2–1.2)

## 2018-09-13 LAB — T4, FREE: FREE T4: 0.94 ng/dL (ref 0.60–1.60)

## 2018-09-13 NOTE — Assessment & Plan Note (Signed)
See social history 

## 2018-09-13 NOTE — Assessment & Plan Note (Signed)
BP Readings from Last 3 Encounters:  09/13/18 112/78  03/29/18 112/68  08/24/17 112/68   Good control Due for labs

## 2018-09-13 NOTE — Progress Notes (Signed)
Hearing Screening   Method: Audiometry   125Hz  250Hz  500Hz  1000Hz  2000Hz  3000Hz  4000Hz  6000Hz  8000Hz   Right ear:           Left ear:           Comments: Pt has hearing aids. Not wearing them today.  Vision Screening Comments: December 2018

## 2018-09-13 NOTE — Assessment & Plan Note (Signed)
I have personally reviewed the Medicare Annual Wellness questionnaire and have noted 1. The patient's medical and social history 2. Their use of alcohol, tobacco or illicit drugs 3. Their current medications and supplements 4. The patient's functional ability including ADL's, fall risks, home safety risks and hearing or visual             impairment. 5. Diet and physical activities 6. Evidence for depression or mood disorders  The patients weight, height, BMI and visual acuity have been recorded in the chart I have made referrals, counseling and provided education to the patient based review of the above and I have provided the pt with a written personalized care plan for preventive services.  I have provided you with a copy of your personalized plan for preventive services. Please take the time to review along with your updated medication list.  No cancer screening due to age Had flu vaccine Discussed exercise Needs Td and shingrix

## 2018-09-13 NOTE — Progress Notes (Addendum)
Subjective:    Patient ID: Brandon Lewis, male    DOB: 03/12/1934, 84 y.o.   MRN: [id number]  HPI Here for Medicare wellness visit and follow up of chronic health conditions Reviewed form and advanced directives Reviewed other doctors Rare drink of alcohol No tobacco Not exercising---discussed Vision okay Hearing aides help some No falls No depression or anhedonia Independent with instrumental ADLs No memory problems  Having some back pain Wonders if he needs a CT scan to look for a stone No hematuria Just achy and dull--comes and goes  Also with pain in his right hip Goes back 6-8 months--and worsening  It "catches" This is not like his stable claudication Takes naproxen occasionally--- helps some  Does bruise easily--relates to the plavix He did stop the aspirin after our last visit No abnormal bleeding--but did have some blood from his "sinuses" on right recently  Reviewed labs for some years Stable GFR in 50's  Current Outpatient Medications on File Prior to Visit  Medication Sig Dispense Refill  . cholecalciferol (VITAMIN D) 1000 UNITS tablet Take 1,000 Units by mouth daily.    . clopidogrel (PLAVIX) 75 MG tablet TAKE ONE TABLET BY MOUTH EVERY DAY *BOTTLE* 90 tablet 3  . fluticasone (FLONASE) 50 MCG/ACT nasal spray Place 2 sprays into the nose daily as needed.     Marland Kitchen losartan (COZAAR) 100 MG tablet TAKE ONE TABLET BY MOUTH EACH MORNING FOR BLOOD PRESSURE CONTROL. *BOTTLE* *BOTTLE* 30 tablet 11  . metoprolol succinate (TOPROL-XL) 50 MG 24 hr tablet **VIAL ONLY** TAKE (1) TABLET BY MOUTH ONCE A DAY.DO NOT CRUSH. (HIGHBLOOD PRESSURE) *BOTTLE* 30 tablet 11  . Multiple Vitamin (MULTIVITAMIN) tablet Take 1 tablet by mouth daily.      . Naproxen Sodium 220 MG CAPS Take 220-440 mg by mouth 2 (two) times daily as needed.    . rosuvastatin (CRESTOR) 10 MG tablet **VIAL ONLY** TAKE ONE TABLET BY MOUTH AT BEDTIME. (IMPROVES CHOLESTEROL) 90 tablet 3  . sildenafil (REVATIO)  20 MG tablet TAKE 3-5 TABLETS BY MOUTH DAILY AS NEEDED 50 tablet 11   No current facility-administered medications on file prior to visit.     Allergies  Allergen Reactions  . Cilostazol     REACTION: stomach problems- constipation  . Simvastatin     REACTION: myalgia    Past Medical History:  Diagnosis Date  . CAD (coronary artery disease)    s/p PCI after presenting with exertional angina (drug eluting stent)  . Diverticulosis   . GERD (gastroesophageal reflux disease)   . HTN (hypertension)   . Hyperlipidemia   . Osteoarthritis   . PVD (peripheral vascular disease) ([place])    s/p aortobifemoral bypass, with bilateral SFA occlusion and intermittent claudication   . Small bowel obstruction [facility])     Past Surgical History:  Procedure Laterality Date  . aorto-bifem  2003   [facility]   . CAROTID STENT     [facility]  . CATARACT EXTRACTION, BILATERAL  2017  . CHOLECYSTECTOMY  2002    Family History  Problem Relation Age of Onset  . Diabetes Father        DM - and brother   . Coronary artery disease Paternal Grandfather   . Colon cancer Neg Hx   . Prostate cancer Neg Hx     Social History   Socioeconomic History  . Marital status: Married    Spouse name: Not on file  . Number of children: 3  . Years of education: Not  on file  . Highest education level: Not on file  Occupational History  . 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 Financial resource strain: Not on file  . Food insecurity:    Worry: Not on file    Inability: Not on file  . Transportation needs:    Medical: Not on file    Non-medical: Not on file  Tobacco Use  . Smoking status: Former Smoker    Types: Cigarettes    Last attempt to quit: 10/20/1992    Years since quitting: 25.9  . Smokeless tobacco: Never Used  . Tobacco comment: quit in 1994   Substance and Sexual Activity  . Alcohol use: Yes    Comment: rare  . Drug use: No  . Sexual activity: Not on file    Lifestyle  . Physical activity:    Days per week: Not on file    Minutes per session: Not on file  . Stress: Not on file  Relationships  . Social connections:    Talks on phone: Not on file    Gets together: Not on file    Attends religious service: Not on file    Active member of club or organization: Not on file    Attends meetings of clubs or organizations: Not on file    Relationship status: Not on file  . Intimate partner violence:    Fear of current or ex partner: Not on file    Emotionally abused: Not on file    Physically abused: Not on file    Forced sexual activity: Not on file  Other Topics Concern  . Not on file  Social History Narrative   Widowed; then remarried; 3 sons.      Has a living will - wife has health care POA    Son Brandon Lewis would be alternate   Would want resuscitation attempts   Would accept brief trial of artificial nutrition      Pt signed designated party release form and gives Brandon Lewis [phone] (home #), access to medical records. Can also leave msg on home answering machine. Cell # [phone]   Review of Systems Appetite is fine---tries to eat healthier Weight fairly stable Sleeps well Nocturia x 3. No daytime problems other than some urgency Keeps up with dentist Wears seat belt No recent skin problems Occasional heartburn---tums or other OTC helps. Rarely PPI. No dysphagia    Objective:   Physical Exam  Constitutional: He is oriented to person, place, and time. He appears well-developed. No distress.  HENT:  Mouth/Throat: Oropharynx is clear and moist. No oropharyngeal exudate.  Neck: No thyromegaly present.  Cardiovascular: Normal rate, regular rhythm and normal heart sounds. Exam reveals no gallop.  No murmur heard. Respiratory: Breath sounds normal. No respiratory distress. He has no wheezes. He has no rales.  GI: Soft. There is no tenderness.  Musculoskeletal: He exhibits no edema.  No internal rotation of right hip and it  recreates the pain Feet cool but no skin changes  Lymphadenopathy:    He has no cervical adenopathy.  Neurological: He is alert and oriented to person, place, and time.  President--- "Brandon Lewis, Brandon Lewis" [phone] [id number] Recall 3/3  Skin: No rash noted. No erythema.  Psychiatric: He has a normal mood and affect. His behavior is normal.           Assessment & Plan:

## 2018-09-13 NOTE — Assessment & Plan Note (Signed)
Will continue secondary prevention

## 2018-09-13 NOTE — Patient Instructions (Signed)
DASH Eating Plan DASH stands for "Dietary Approaches to Stop Hypertension." The DASH eating plan is a healthy eating plan that has been shown to reduce high blood pressure (hypertension). It may also reduce your risk for type 2 diabetes, heart disease, and stroke. The DASH eating plan may also help with weight loss. What are tips for following this plan? General guidelines  Avoid eating more than 2,300 mg (milligrams) of salt (sodium) a day. If you have hypertension, you may need to reduce your sodium intake to 1,500 mg a day.  Limit alcohol intake to no more than 1 drink a day for nonpregnant women and 2 drinks a day for men. One drink equals 12 oz of beer, 5 oz of wine, or 1 oz of hard liquor.  Work with your health care provider to maintain a healthy body weight or to lose weight. Ask what an ideal weight is for you.  Get at least 30 minutes of exercise that causes your heart to beat faster (aerobic exercise) most days of the week. Activities may include walking, swimming, or biking.  Work with your health care provider or diet and nutrition specialist (dietitian) to adjust your eating plan to your individual calorie needs. Reading food labels  Check food labels for the amount of sodium per serving. Choose foods with less than 5 percent of the Daily Value of sodium. Generally, foods with less than 300 mg of sodium per serving fit into this eating plan.  To find whole grains, look for the word "whole" as the first word in the ingredient list. Shopping  Buy products labeled as "low-sodium" or "no salt added."  Buy fresh foods. Avoid canned foods and premade or frozen meals. Cooking  Avoid adding salt when cooking. Use salt-free seasonings or herbs instead of table salt or sea salt. Check with your health care provider or pharmacist before using salt substitutes.  Do not fry foods. Cook foods using healthy methods such as baking, boiling, grilling, and broiling instead.  Cook with  heart-healthy oils, such as olive, canola, soybean, or sunflower oil. Meal planning   Eat a balanced diet that includes: ? 5 or more servings of fruits and vegetables each day. At each meal, try to fill half of your plate with fruits and vegetables. ? Up to 6-8 servings of whole grains each day. ? Less than 6 oz of lean meat, poultry, or fish each day. A 3-oz serving of meat is about the same size as a deck of cards. One egg equals 1 oz. ? 2 servings of low-fat dairy each day. ? A serving of nuts, seeds, or beans 5 times each week. ? Heart-healthy fats. Healthy fats called Omega-3 fatty acids are found in foods such as flaxseeds and coldwater fish, like sardines, salmon, and mackerel.  Limit how much you eat of the following: ? Canned or prepackaged foods. ? Food that is high in trans fat, such as fried foods. ? Food that is high in saturated fat, such as fatty meat. ? Sweets, desserts, sugary drinks, and other foods with added sugar. ? Full-fat dairy products.  Do not salt foods before eating.  Try to eat at least 2 vegetarian meals each week.  Eat more home-cooked food and less restaurant, buffet, and fast food.  When eating at a restaurant, ask that your food be prepared with less salt or no salt, if possible. What foods are recommended? The items listed may not be a complete list. Talk with your dietitian about what   dietary choices are best for you. Grains Whole-grain or whole-wheat bread. Whole-grain or whole-wheat pasta. Brown rice. Oatmeal. Quinoa. Bulgur. Whole-grain and low-sodium cereals. Pita bread. Low-fat, low-sodium crackers. Whole-wheat flour tortillas. Vegetables Fresh or frozen vegetables (raw, steamed, roasted, or grilled). Low-sodium or reduced-sodium tomato and vegetable juice. Low-sodium or reduced-sodium tomato sauce and tomato paste. Low-sodium or reduced-sodium canned vegetables. Fruits All fresh, dried, or frozen fruit. Canned fruit in natural juice (without  added sugar). Meat and other protein foods Skinless chicken or turkey. Ground chicken or turkey. Pork with fat trimmed off. Fish and seafood. Egg whites. Dried beans, peas, or lentils. Unsalted nuts, nut butters, and seeds. Unsalted canned beans. Lean cuts of beef with fat trimmed off. Low-sodium, lean deli meat. Dairy Low-fat (1%) or fat-free (skim) milk. Fat-free, low-fat, or reduced-fat cheeses. Nonfat, low-sodium ricotta or cottage cheese. Low-fat or nonfat yogurt. Low-fat, low-sodium cheese. Fats and oils Soft margarine without trans fats. Vegetable oil. Low-fat, reduced-fat, or light mayonnaise and salad dressings (reduced-sodium). Canola, safflower, olive, soybean, and sunflower oils. Avocado. Seasoning and other foods Herbs. Spices. Seasoning mixes without salt. Unsalted popcorn and pretzels. Fat-free sweets. What foods are not recommended? The items listed may not be a complete list. Talk with your dietitian about what dietary choices are best for you. Grains Baked goods made with fat, such as croissants, muffins, or some breads. Dry pasta or rice meal packs. Vegetables Creamed or fried vegetables. Vegetables in a cheese sauce. Regular canned vegetables (not low-sodium or reduced-sodium). Regular canned tomato sauce and paste (not low-sodium or reduced-sodium). Regular tomato and vegetable juice (not low-sodium or reduced-sodium). Pickles. Olives. Fruits Canned fruit in a light or heavy syrup. Fried fruit. Fruit in cream or butter sauce. Meat and other protein foods Fatty cuts of meat. Ribs. Fried meat. Bacon. Sausage. Bologna and other processed lunch meats. Salami. Fatback. Hotdogs. Bratwurst. Salted nuts and seeds. Canned beans with added salt. Canned or smoked fish. Whole eggs or egg yolks. Chicken or turkey with skin. Dairy Whole or 2% milk, cream, and half-and-half. Whole or full-fat cream cheese. Whole-fat or sweetened yogurt. Full-fat cheese. Nondairy creamers. Whipped toppings.  Processed cheese and cheese spreads. Fats and oils Butter. Stick margarine. Lard. Shortening. Ghee. Bacon fat. Tropical oils, such as coconut, palm kernel, or palm oil. Seasoning and other foods Salted popcorn and pretzels. Onion salt, garlic salt, seasoned salt, table salt, and sea salt. Worcestershire sauce. Tartar sauce. Barbecue sauce. Teriyaki sauce. Soy sauce, including reduced-sodium. Steak sauce. Canned and packaged gravies. Fish sauce. Oyster sauce. Cocktail sauce. Horseradish that you find on the shelf. Ketchup. Mustard. Meat flavorings and tenderizers. Bouillon cubes. Hot sauce and Tabasco sauce. Premade or packaged marinades. Premade or packaged taco seasonings. Relishes. Regular salad dressings. Where to find more information:  National Heart, Lung, and Blood Institute: www.nhlbi.nih.gov  American Heart Association: www.heart.org Summary  The DASH eating plan is a healthy eating plan that has been shown to reduce high blood pressure (hypertension). It may also reduce your risk for type 2 diabetes, heart disease, and stroke.  With the DASH eating plan, you should limit salt (sodium) intake to 2,300 mg a day. If you have hypertension, you may need to reduce your sodium intake to 1,500 mg a day.  When on the DASH eating plan, aim to eat more fresh fruits and vegetables, whole grains, lean proteins, low-fat dairy, and heart-healthy fats.  Work with your health care provider or diet and nutrition specialist (dietitian) to adjust your eating plan to your individual   calorie needs. This information is not intended to replace advice given to you by your health care provider. Make sure you discuss any questions you have with your health care provider. Document Released: 09/25/2011 Document Revised: 09/29/2016 Document Reviewed: 09/29/2016 Elsevier Interactive Patient Education  2018 Elsevier Inc.  

## 2018-09-13 NOTE — Assessment & Plan Note (Signed)
Minor bruising, etc Will check labs

## 2018-09-13 NOTE — Assessment & Plan Note (Signed)
No distal pulses but no pain plavix and statin

## 2018-09-13 NOTE — Assessment & Plan Note (Signed)
History and PE findings suggest severe OA Will set up with ortho

## 2018-09-14 ENCOUNTER — Other Ambulatory Visit: Payer: Self-pay | Admitting: Internal Medicine

## 2018-09-14 DIAGNOSIS — N183 Chronic kidney disease, stage 3 (moderate): Secondary | ICD-10-CM

## 2018-09-14 DIAGNOSIS — D649 Anemia, unspecified: Secondary | ICD-10-CM

## 2018-09-14 DIAGNOSIS — N1832 Chronic kidney disease, stage 3b: Secondary | ICD-10-CM | POA: Insufficient documentation

## 2018-09-14 DIAGNOSIS — N1831 Chronic kidney disease, stage 3a: Secondary | ICD-10-CM | POA: Insufficient documentation

## 2018-09-14 NOTE — Assessment & Plan Note (Signed)
Will recheck labs Is on ARB

## 2018-09-22 ENCOUNTER — Encounter (INDEPENDENT_AMBULATORY_CARE_PROVIDER_SITE_OTHER): Payer: Self-pay | Admitting: Orthopaedic Surgery

## 2018-09-22 ENCOUNTER — Ambulatory Visit (INDEPENDENT_AMBULATORY_CARE_PROVIDER_SITE_OTHER): Payer: Self-pay

## 2018-09-22 ENCOUNTER — Ambulatory Visit (INDEPENDENT_AMBULATORY_CARE_PROVIDER_SITE_OTHER): Payer: Medicare Other | Admitting: Orthopaedic Surgery

## 2018-09-22 ENCOUNTER — Other Ambulatory Visit (INDEPENDENT_AMBULATORY_CARE_PROVIDER_SITE_OTHER): Payer: Self-pay

## 2018-09-22 DIAGNOSIS — M25551 Pain in right hip: Secondary | ICD-10-CM

## 2018-09-22 DIAGNOSIS — M4807 Spinal stenosis, lumbosacral region: Secondary | ICD-10-CM

## 2018-09-22 DIAGNOSIS — M5431 Sciatica, right side: Secondary | ICD-10-CM | POA: Diagnosis not present

## 2018-09-22 NOTE — Progress Notes (Signed)
Office Visit Note   Patient: Brandon Lewis           Date of Birth: Apr 27, 1934           MRN: [id number] Visit Date: 09/22/2018              Requested by: Venia Carbon, MD [place], [place] [id number] PCP: Venia Carbon, MD   Assessment & Plan: Visit Diagnoses:  1. Pain in right hip   2. Sciatica, right side     Plan: He understands that I feel that this is not a hip issue at all.  He hurts in his lower aspect of the lumbar spine to the right side.  It radiates into the sciatic region.  He has no groin pain at all and his hip exam is normal.  He does have a positive straight leg raise to the right side so at this point an MRI is warranted to assess for any pathology of the right side of his lower spine that would then benefit from an intervention such as an epidural steroid versus a facet joint injection.  We will see him back once the MRI is obtained.  All question concerns were answered and addressed.  Follow-Up Instructions: Return in about 2 weeks (around 10/06/2018).   Orders:  Orders Placed This Encounter  Procedures  . XR HIP UNILAT W OR W/O PELVIS 1V RIGHT   No orders of the defined types were placed in this encounter.     Procedures: No procedures performed   Clinical Data: No additional findings.   Subjective: Chief Complaint  Patient presents with  . Right Hip - Pain  Patient is a very pleasant and active 84 year old gentleman sent from Dr. Silvio Pate to evaluate right hip pain.  He denies any pain in the groin he points to the lower aspect of his lumbar spine in the sciatic region as a source of his pain.  This is been going on for about a year and intermittently gets some significant problems.  He denies any change in bowel bladder function and denies any weakness in his legs were noticing tingling.  It is almost a catching sensation that he gets it sometimes is quite severe.  HPI  Review of Systems He currently denies any  headache, chest pain, shortness of breath, fever, chills, nausea, vomiting.  Objective: Vital Signs: There were no vitals taken for this visit.  Physical Exam He is alert awake x3 and in no acute distress Ortho Exam Examination of his right hip shows no pain in the groin with full range of motion.  When I put him through the extreme of internal rotation and lift his leg he gets a lot of pain in the lower spine so this is a definitely positive straight leg raise.  He does hurt in the facet joints of the lower lumbar spine to the right side in the paraspinal muscles.  The remainder of his hip exam is normal.  There is no pain of the trochanteric area.  He has no weakness in his right lower extremity and no numbness and tingling. Specialty Comments:  No specialty comments available.  Imaging: Xr Hip Unilat W Or W/o Pelvis 1v Right  Result Date: 09/22/2018 An AP pelvis and lateral the right hip shows normal-appearing hip joint with no acute findings.    PMFS History: Patient Active Problem List   Diagnosis Date Noted  . Sciatica, right side 09/22/2018  . Chronic renal  disease, stage III ([place]) 09/14/2018  . Right hip pain 09/13/2018  . Thrombocytopenia ([place]) 08/25/2017  . Actinic keratosis 08/14/2015  . Advanced directives, counseling/discussion 07/25/2014  . Routine general medical examination at a health care facility 06/25/2012  . GERD (gastroesophageal reflux disease)   . IMPAIRED FASTING GLUCOSE 03/08/2010  . CAD, NATIVE VESSEL 05/30/2009  . Hyperlipemia 03/02/2009  . Essential hypertension, benign 09/22/2007  . Atherosclerotic peripheral vascular disease ([place]) 09/22/2007  . Osteoarthritis, generalized 09/22/2007   Past Medical History:  Diagnosis Date  . CAD (coronary artery disease)    s/p PCI after presenting with exertional angina (drug eluting stent)  . Diverticulosis   . GERD (gastroesophageal reflux disease)   . HTN (hypertension)   . Hyperlipidemia   .  Osteoarthritis   . PVD (peripheral vascular disease) ([place])    s/p aortobifemoral bypass, with bilateral SFA occlusion and intermittent claudication   . Small bowel obstruction ([facility])     Family History  Problem Relation Age of Onset  . Diabetes Father        DM - and brother   . Coronary artery disease Paternal Grandfather   . Colon cancer Neg Hx   . Prostate cancer Neg Hx     Past Surgical History:  Procedure Laterality Date  . aorto-bifem  2003   [facility]   . CAROTID STENT     [facility]  . CATARACT EXTRACTION, BILATERAL  2017  . CHOLECYSTECTOMY  2002   Social History   Occupational History  . Occupation: Landscape architect    Comment: Retired  Tobacco Use  . Smoking status: Former Smoker    Types: Cigarettes    Last attempt to quit: 10/20/1992    Years since quitting: 25.9  . Smokeless tobacco: Never Used  . Tobacco comment: quit in 1994   Substance and Sexual Activity  . Alcohol use: Yes    Comment: rare  . Drug use: No  . Sexual activity: Not on file

## 2018-09-24 ENCOUNTER — Other Ambulatory Visit (INDEPENDENT_AMBULATORY_CARE_PROVIDER_SITE_OTHER): Payer: Medicare Other

## 2018-09-24 DIAGNOSIS — D649 Anemia, unspecified: Secondary | ICD-10-CM | POA: Diagnosis not present

## 2018-09-24 LAB — FECAL OCCULT BLOOD, IMMUNOCHEMICAL: FECAL OCCULT BLD: NEGATIVE

## 2018-10-06 ENCOUNTER — Ambulatory Visit (INDEPENDENT_AMBULATORY_CARE_PROVIDER_SITE_OTHER): Payer: Medicare Other | Admitting: Orthopaedic Surgery

## 2018-10-11 ENCOUNTER — Ambulatory Visit
Admission: RE | Admit: 2018-10-11 | Discharge: 2018-10-11 | Disposition: A | Discharge status: Home / Self Care | Payer: Medicare Other | Source: Ambulatory Visit | Attending: Orthopaedic Surgery | Admitting: Orthopaedic Surgery

## 2018-10-11 DIAGNOSIS — M4325 Fusion of spine, thoracolumbar region: Secondary | ICD-10-CM | POA: Insufficient documentation

## 2018-10-11 DIAGNOSIS — M5136 Other intervertebral disc degeneration, lumbar region: Secondary | ICD-10-CM | POA: Diagnosis not present

## 2018-10-11 DIAGNOSIS — M48061 Spinal stenosis, lumbar region without neurogenic claudication: Secondary | ICD-10-CM | POA: Diagnosis not present

## 2018-10-11 DIAGNOSIS — M4807 Spinal stenosis, lumbosacral region: Secondary | ICD-10-CM | POA: Diagnosis present

## 2018-10-14 ENCOUNTER — Encounter (INDEPENDENT_AMBULATORY_CARE_PROVIDER_SITE_OTHER): Payer: Self-pay | Admitting: Orthopaedic Surgery

## 2018-10-14 ENCOUNTER — Ambulatory Visit (INDEPENDENT_AMBULATORY_CARE_PROVIDER_SITE_OTHER): Payer: Medicare Other | Admitting: Orthopaedic Surgery

## 2018-10-14 DIAGNOSIS — M5431 Sciatica, right side: Secondary | ICD-10-CM

## 2018-10-14 DIAGNOSIS — M4807 Spinal stenosis, lumbosacral region: Secondary | ICD-10-CM

## 2018-10-14 NOTE — Progress Notes (Signed)
The patient is an 84 year old gentleman who comes in today to go over MRI of his lumbar spine.  On occasion he gets a sharp catching sensation in his low back to the right side.  This does not radiate into his groin and does not go down his leg.  I already worked up his right hip which was normal on exam.  He says sometimes he stands and he will be leaning over the counter and move certain way and get severe pain enough to shift his weight and then the pain will subside.  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  She is totally dependent from a mobility standpoint on him.  He still denies any change in bowel or bladder function.  On exam he gets up on the exam table pretty easily.  He does show some pain in the lower aspect of the lumbar spine to the right side.  Flexion extension aggravates the pain.  He does walk slightly bent over.  His right hip exam continues to be normal.  He has normal sensation in both legs and good mobility of both legs with no deficits in muscle strength or tone.  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  He is interested in at least seeing Dr. Ernestina Patches to consider an intervention of his lumbar spine.  This would be an injection to the right side most likely at the facet joints of the lower lumbar spine versus an interlaminar type of injection but it seems like most of his symptoms are back related in terms of the joints of the back with no radicular component.  He has Dr. Romona Curls card.  He will give him a call.  He would rather him do that did not schedule him with Dr. Ernestina Patches as he is currently dealing with his own wife.

## 2018-11-12 ENCOUNTER — Telehealth: Payer: Self-pay | Admitting: Internal Medicine

## 2018-11-12 DIAGNOSIS — M79673 Pain in unspecified foot: Secondary | ICD-10-CM

## 2018-11-12 NOTE — Telephone Encounter (Signed)
Pt is requesting to be referred to a podiatrist. He seen Dr.Blackman and a MRI was done. Dr.Blackman is now advising the pt to go to a podiatrist. Pt needs a referral put in. Please advise if we need to schedule an ov. Pt is requesting call back.

## 2018-11-12 NOTE — Telephone Encounter (Signed)
Left message on vm per DPR °

## 2018-11-12 NOTE — Telephone Encounter (Signed)
Let him know I put in the referral and he should hear directly from [place]

## 2018-11-16 ENCOUNTER — Other Ambulatory Visit (INDEPENDENT_AMBULATORY_CARE_PROVIDER_SITE_OTHER): Payer: Self-pay

## 2018-11-16 ENCOUNTER — Telehealth (INDEPENDENT_AMBULATORY_CARE_PROVIDER_SITE_OTHER): Payer: Self-pay | Admitting: Orthopaedic Surgery

## 2018-11-16 DIAGNOSIS — M5431 Sciatica, right side: Secondary | ICD-10-CM

## 2018-11-16 NOTE — Telephone Encounter (Signed)
That will be fine.  I assume it is for considering injections in his lumbar spine as well as the management of pain from his low back.

## 2018-11-16 NOTE — Telephone Encounter (Signed)
Order sent.

## 2018-11-16 NOTE — Telephone Encounter (Signed)
Patient called asked if Dr Ninfa Linden will refer him to Dr. Dianna Limbo office at [place] [address]  Ph# is [phone]. The number to contact patient is [phone]

## 2018-11-23 ENCOUNTER — Ambulatory Visit: Payer: Medicare Other | Admitting: Podiatry

## 2018-11-23 ENCOUNTER — Ambulatory Visit (INDEPENDENT_AMBULATORY_CARE_PROVIDER_SITE_OTHER): Payer: Medicare Other

## 2018-11-23 ENCOUNTER — Encounter: Payer: Self-pay | Admitting: Podiatry

## 2018-11-23 DIAGNOSIS — S99922A Unspecified injury of left foot, initial encounter: Secondary | ICD-10-CM

## 2018-11-23 DIAGNOSIS — B351 Tinea unguium: Secondary | ICD-10-CM | POA: Diagnosis not present

## 2018-11-23 DIAGNOSIS — L989 Disorder of the skin and subcutaneous tissue, unspecified: Secondary | ICD-10-CM | POA: Diagnosis not present

## 2018-11-23 DIAGNOSIS — M79676 Pain in unspecified toe(s): Secondary | ICD-10-CM | POA: Diagnosis not present

## 2018-11-26 ENCOUNTER — Ambulatory Visit: Payer: Medicare Other | Admitting: Podiatry

## 2018-11-26 NOTE — Progress Notes (Signed)
    Subjective: Patient is a 85 y.o. male presenting to the office today as a new patient with a chief complaint of painful callus lesions to the bilateral feet that have been present for several years. Walking and bearing weight increases the pain. He has not done anything at home for treatment.   Patient also complains of elongated, thickened nails that cause pain while ambulating in shoes. He is unable to trim his own nails. Patient presents today for further treatment and evaluation.  Past Medical History:  Diagnosis Date  . CAD (coronary artery disease)    s/p PCI after presenting with exertional angina (drug eluting stent)  . Diverticulosis   . GERD (gastroesophageal reflux disease)   . HTN (hypertension)   . Hyperlipidemia   . Osteoarthritis   . PVD (peripheral vascular disease) ([place])    s/p aortobifemoral bypass, with bilateral SFA occlusion and intermittent claudication   . Small bowel obstruction (HCC)     Objective:  Physical Exam General: Alert and oriented x3 in no acute distress  Dermatology: Hyperkeratotic lesions present on the bilateral feet. Pain on palpation with a central nucleated core noted. Skin is warm, dry and supple bilateral lower extremities. Negative for open lesions or macerations. Nails are tender, long, thickened and dystrophic with subungual debris, consistent with onychomycosis, 1-5 bilateral. No signs of infection noted.  Vascular: Palpable pedal pulses bilaterally. No edema or erythema noted. Capillary refill within normal limits.  Neurological: Epicritic and protective threshold grossly intact bilaterally.   Musculoskeletal Exam: Pain on palpation at the keratotic lesion noted. Range of motion within normal limits bilateral. Muscle strength 5/5 in all groups bilateral.  Radiographic Exam:  Normal osseous mineralization. Joint spaces preserved. No fracture/dislocation/boney destruction.    Assessment: 1. Onychodystrophic nails 1-5 bilateral with  hyperkeratosis of nails.  2. Onychomycosis of nail due to dermatophyte bilateral 3. Porokeratosis noted to the bilateral feet x 4   Plan of Care:  1. Patient evaluated. X-Rays reviewed.  2. Excisional debridement of keratoic lesion using a chisel blade was performed without incident.  3. Dressed with light dressing. 4. Mechanical debridement of nails 1-5 bilaterally performed using a nail nipper. Filed with dremel without incident.  5. Patient is to return to the clinic in 3 months.   Edrick Kins, DPM [facility]  Dr. Edrick Kins, [address]                                        [place], [place] [address]                Office [phone]  Fax [phone]

## 2018-12-08 ENCOUNTER — Encounter: Payer: Self-pay | Admitting: Physical Medicine & Rehabilitation

## 2019-01-03 ENCOUNTER — Encounter: Payer: Self-pay | Admitting: Physical Medicine & Rehabilitation

## 2019-01-03 ENCOUNTER — Encounter: Payer: Medicare Other | Attending: Physical Medicine & Rehabilitation

## 2019-01-03 ENCOUNTER — Ambulatory Visit: Payer: Medicare Other | Admitting: Physical Medicine & Rehabilitation

## 2019-01-03 ENCOUNTER — Other Ambulatory Visit: Payer: Self-pay

## 2019-01-03 VITALS — BP 135/61 | HR 86 | Ht 71.0 in | Wt 211.0 lb

## 2019-01-03 DIAGNOSIS — M47816 Spondylosis without myelopathy or radiculopathy, lumbar region: Secondary | ICD-10-CM | POA: Diagnosis not present

## 2019-01-03 DIAGNOSIS — M5431 Sciatica, right side: Secondary | ICD-10-CM | POA: Insufficient documentation

## 2019-01-03 DIAGNOSIS — M47896 Other spondylosis, lumbar region: Secondary | ICD-10-CM | POA: Insufficient documentation

## 2019-01-03 DIAGNOSIS — M48061 Spinal stenosis, lumbar region without neurogenic claudication: Secondary | ICD-10-CM | POA: Insufficient documentation

## 2019-01-03 NOTE — Progress Notes (Signed)
Subjective:    Patient ID: Brandon Lewis, male    DOB: 06-Jul-1934, 85 y.o.   MRN: [id number]  HPI CC:  Catching pain in back  Pt has aching pain on right side of back.Occasionally shooting down the RIght thigh (~1 time a week)  Pt initially thought it was his hip but saw ortho (Dr Ninfa Linden) who did a hip xray read as negative The patient was then sent for lumbar MRI with results listed as below. Denies any numbness or tingling in his feet. He has had no bowel or bladder dysfunction. He does care for his wife who has had a stroke and this at times has been physically demanding for him.  His wife is doing better and does not need heavy physical assistance any longer. Pain Inventory Average Pain 3 Pain Right Now 1 My pain is intermittent  In the last 24 hours, has pain interfered with the following? General activity 2 Relation with others 2 Enjoyment of life 2 What TIME of day is your pain at its worst? daytime Sleep (in general) Fair  Pain is worse with: walking and bending Pain improves with: rest and medication Relief from Meds: 5  Mobility walk without assistance ability to climb steps?  yes do you drive?  yes  Function retired  Neuro/Psych No problems in this area  Prior Studies Any changes since last visit?  no CLINICAL DATA:  85 year old male with progressed chronic lumbar back pain over the past 6 months. Right leg pain. Remote prior surgery.  EXAM: MRI LUMBAR SPINE WITHOUT CONTRAST  TECHNIQUE: Multiplanar, multisequence MR imaging of the lumbar spine was performed. No intravenous contrast was administered.  COMPARISON:  None.  FINDINGS: Segmentation: Lumbar segmentation appears to be normal and will be designated as such for this report.  Alignment: Moderate dextroconvex thoracolumbar junction level scoliosis. Mild retrolisthesis at L1-L2 and L2-L3. Straightening of lower lumbar lordosis.  Vertebrae: Interbody ankylosis at T12-L1. Mildly  heterogeneous bone marrow signal with no marrow edema or evidence of acute osseous abnormality. Intact visible sacrum and SI joints.  Conus medullaris and cauda equina: Conus extends to the T12-L1 level. No lower spinal cord or conus signal abnormality.  Paraspinal and other soft tissues: Evidence of aortoiliac bifurcation thrombosis and vascular bypass. Negative visible abdominal viscera. Negative visualized posterior paraspinal soft tissues.  Disc levels:  T11-T12: Circumferential disc bulge with endplate spurring and mild to moderate facet hypertrophy. No spinal stenosis. Mild right T11 foraminal stenosis.  T12-L1:  Ankylosis.  Mild osseous left T12 foraminal stenosis.  L1-L2: Disc space loss with mild retrolisthesis and left eccentric circumferential disc bulge with endplate spurring. No spinal stenosis. Severe left L1 foraminal stenosis.  L2-L3: Disc space loss with left eccentric circumferential disc osteophyte complex and moderate posterior element hypertrophy. Mild spinal stenosis with moderate left lateral recess and severe left L2 foraminal stenosis. Mild right L2 foraminal stenosis.  L3-L4: Circumferential disc bulge with severe facet and ligament flavum hypertrophy. Severe spinal stenosis best demonstrated on series 5, image 11. Moderate left greater than right lateral recess stenosis (descending L4 nerve levels). Moderate to severe bilateral L3 foraminal stenosis.  L4-L5: Right eccentric circumferential disc bulge with endplate spurring and moderate to severe posterior element hypertrophy. Moderate to severe spinal stenosis with right greater than left lateral recess stenosis (L5 nerve levels). Severe right L4 foraminal stenosis.  L5-S1: Disc space loss with right eccentric circumferential disc osteophyte complex and moderate posterior element hypertrophy. No spinal stenosis, but moderate to severe right  lateral recess (right S1) and right L5 neural  foraminal stenosis.  IMPRESSION: 1. No acute osseous abnormality in the lumbar spine. Chronic T12-L1 ankylosis. 2. Multifactorial moderate or severe spinal, bilateral lateral recess, and foraminal stenosis at L3-L4 and L4-L5. 3. Multifactorial moderate to severe right lateral recess and right foraminal stenosis at L5-S1. 4. L1-L2 degeneration with severe left L1 foraminal stenosis but no spinal stenosis.   Electronically Signed   By: Genevie Ann M.D. Physicians involved in your care Any changes since last visit?  no   Family History  Problem Relation Age of Onset  . Diabetes Father        DM - and brother   . Stroke Father   . Coronary artery disease Paternal Grandfather   . Colon cancer Neg Hx   . Prostate cancer Neg Hx    Social History   Socioeconomic History  . Marital status: Married    Spouse name: Not on file  . Number of children: 3  . Years of education: Not on file  . Highest education level: Not on file  Occupational History  . 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 Financial resource strain: Not on file  . Food insecurity:    Worry: Not on file    Inability: Not on file  . Transportation needs:    Medical: Not on file    Non-medical: Not on file  Tobacco Use  . Smoking status: Former Smoker    Types: Cigarettes    Last attempt to quit: 10/20/1992    Years since quitting: 26.2  . Smokeless tobacco: Never Used  . Tobacco comment: quit in 1994   Substance and Sexual Activity  . Alcohol use: Not Currently    Comment: rare  . Drug use: No  . Sexual activity: Not on file  Lifestyle  . Physical activity:    Days per week: Not on file    Minutes per session: Not on file  . Stress: Not on file  Relationships  . Social connections:    Talks on phone: Not on file    Gets together: Not on file    Attends religious service: Not on file    Active member of club or organization: Not on file    Attends meetings of clubs or  organizations: Not on file    Relationship status: Not on file  Other Topics Concern  . Not on file  Social History Narrative   Widowed; then remarried; 3 sons.      Has a living will - wife has health care POA    Son [name] would be alternate   Would want resuscitation attempts   Would accept brief trial of artificial nutrition      Pt signed designated party release form and gives Zelig Gacek [phone] (home #), access to medical records. Can also leave msg on home answering machine. Cell # [phone]   Past Surgical History:  Procedure Laterality Date  . aorto-bifem  2003   [facility]   . CAROTID STENT     [facility]  . CATARACT EXTRACTION, BILATERAL  2017  . CHOLECYSTECTOMY  2002  . TONSILLECTOMY    . WISDOM TOOTH EXTRACTION     Past Medical History:  Diagnosis Date  . CAD (coronary artery disease)    s/p PCI after presenting with exertional angina (drug eluting stent)  . Diverticulosis   . GERD (gastroesophageal reflux disease)   . HTN (hypertension)   .  Hyperlipidemia   . Osteoarthritis   . PVD (peripheral vascular disease) ([place])    s/p aortobifemoral bypass, with bilateral SFA occlusion and intermittent claudication   . Small bowel obstruction (HCC)    BP 135/61   Pulse 86   Ht 5\' 11"  (1.803 m)   Wt 211 lb (95.7 kg)   SpO2 98%   BMI 29.43 kg/m   Opioid Risk Score:   Fall Risk Score:  `1  Depression screen PHQ 2/9  Depression screen [facility] 2/9 09/13/2018 08/24/2017 08/21/2016 08/14/2015 07/25/2014 07/25/2014 07/19/2013  Decreased Interest 0 0 0 0 0 0 0  Down, Depressed, Hopeless 0 0 0 0 0 0 0  PHQ - 2 Score 0 0 0 0 0 0 0     Review of Systems  Constitutional: Negative.   HENT: Negative.   Eyes: Negative.   Respiratory: Negative.   Cardiovascular: Negative.   Gastrointestinal: Negative.   Endocrine: Negative.   Genitourinary: Negative.   Musculoskeletal: Positive for arthralgias and back pain.  Skin: Negative.   Allergic/Immunologic: Negative.    Neurological: Negative.   Hematological: Negative.   Psychiatric/Behavioral: Negative.   All other systems reviewed and are negative.      Objective:   Physical Exam Vitals signs and nursing note reviewed.  Constitutional:      Appearance: Normal appearance. He is obese.  HENT:     Head: Normocephalic and atraumatic.     Nose: Nose normal.     Mouth/Throat:     Mouth: Mucous membranes are moist.     Pharynx: Oropharynx is clear.  Neck:     Musculoskeletal: Normal range of motion.  Cardiovascular:     Rate and Rhythm: Normal rate and regular rhythm.     Heart sounds: Normal heart sounds. No murmur.  Pulmonary:     Effort: Pulmonary effort is normal.     Breath sounds: Normal breath sounds.  Abdominal:     General: Abdomen is flat. Bowel sounds are normal. There is no distension.     Palpations: Abdomen is soft.  Musculoskeletal:     Right hip: He exhibits decreased range of motion. He exhibits no tenderness.     Left hip: He exhibits decreased range of motion. He exhibits no bony tenderness.     Cervical back: He exhibits normal range of motion and no tenderness.     Thoracic back: He exhibits decreased range of motion and tenderness.     Lumbar back: He exhibits decreased range of motion and tenderness.  Skin:    General: Skin is warm and dry.  Neurological:     General: No focal deficit present.     Mental Status: He is alert and oriented to person, place, and time.     Motor: No weakness, tremor or abnormal muscle tone.     Gait: Gait is intact.     Deep Tendon Reflexes:     Reflex Scores:      Patellar reflexes are 1+ on the right side and 1+ on the left side.      Achilles reflexes are 0 on the right side and 0 on the left side.    Comments: Negative straight leg raising test Patient is hard of hearing  Motor strength is 5/5 bilateral hip flexor knee extensor ankle dorsiflexor  Psychiatric:        Mood and Affect: Mood normal.        Behavior: Behavior normal.     Positive Faber's at the right PSIS  Assessment & Plan:  #1.  Patient with primarily right-sided low back pain.  His lumbar MRI shows diffuse lumbar spondylosis as well as foraminal stenosis at multiple levels. His examination is most consistent with lumbar facet mediated pain. He does have occasional shooting pains into the right thigh but these are quite rare and not his primary complaint this could certainly be caused by the foraminal stenosis on the right side at L3-4 We discussed that he can use over-the-counter Tylenol for pain and limit his use of Aleve to only once or twice a week given that he has CKD 2  I also gave him back exercises.  We may refer him to physical therapy once pain is diminished Discussed with patient agrees with plan

## 2019-01-03 NOTE — Patient Instructions (Signed)

## 2019-02-03 ENCOUNTER — Other Ambulatory Visit: Payer: Self-pay

## 2019-02-03 ENCOUNTER — Ambulatory Visit: Payer: Medicare Other | Admitting: Physical Medicine & Rehabilitation

## 2019-02-03 NOTE — Patient Outreach (Signed)
[facility]) Care Management  02/03/2019  MIQUAN TANDON 22-Nov-1933 [id number]   Medication Adherence call to Mr. 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 Saldivar is showing due under [place].   [place] Management Direct Dial [phone]  Fax [phone] [email or web address]

## 2019-02-08 ENCOUNTER — Ambulatory Visit: Payer: Medicare Other | Admitting: Physical Medicine & Rehabilitation

## 2019-02-22 ENCOUNTER — Other Ambulatory Visit: Payer: Self-pay

## 2019-02-22 ENCOUNTER — Encounter: Payer: Self-pay | Admitting: Podiatry

## 2019-02-22 ENCOUNTER — Ambulatory Visit: Payer: Medicare Other | Admitting: Podiatry

## 2019-02-22 VITALS — Temp 97.5°F

## 2019-02-22 DIAGNOSIS — B351 Tinea unguium: Secondary | ICD-10-CM | POA: Diagnosis not present

## 2019-02-22 DIAGNOSIS — L989 Disorder of the skin and subcutaneous tissue, unspecified: Secondary | ICD-10-CM | POA: Diagnosis not present

## 2019-02-22 DIAGNOSIS — M79676 Pain in unspecified toe(s): Secondary | ICD-10-CM | POA: Diagnosis not present

## 2019-02-22 DIAGNOSIS — L600 Ingrowing nail: Secondary | ICD-10-CM

## 2019-03-01 NOTE — Progress Notes (Signed)
    Subjective: Patient presents today for evaluation of elongated, thickened nails that cause pain while ambulating in shoes. He is unable to trim his own nails. Patient presents today for further treatment and evaluation.  Patient also states he has ingrown toenails that he needs to be trimmed out.  Past Medical History:  Diagnosis Date  . CAD (coronary artery disease)    s/p PCI after presenting with exertional angina (drug eluting stent)  . Diverticulosis   . GERD (gastroesophageal reflux disease)   . HTN (hypertension)   . Hyperlipidemia   . Osteoarthritis   . PVD (peripheral vascular disease) ([place])    s/p aortobifemoral bypass, with bilateral SFA occlusion and intermittent claudication   . Small bowel obstruction (HCC)     Objective:  Physical Exam General: Alert and oriented x3 in no acute distress  Dermatology: Hyperkeratotic lesions present on the bilateral feet. Pain on palpation with a central nucleated core noted. Skin is warm, dry and supple bilateral lower extremities. Negative for open lesions or macerations. Nails are tender, long, thickened and dystrophic with subungual debris, consistent with onychomycosis, 1-5 bilateral. No signs of infection noted.  Incurvated nail noted to the bilateral great toes consistent with ingrown toenail  Vascular: Palpable pedal pulses bilaterally. No edema or erythema noted. Capillary refill within normal limits.  Neurological: Epicritic and protective threshold grossly intact bilaterally.   Musculoskeletal Exam: Pain on palpation at the keratotic lesion noted. Range of motion within normal limits bilateral. Muscle strength 5/5 in all groups bilateral.  Radiographic Exam:  Normal osseous mineralization. Joint spaces preserved. No fracture/dislocation/boney destruction.    Assessment: 1. Onychodystrophic nails 1-5 bilateral with hyperkeratosis of nails.  2. Onychomycosis of nail due to dermatophyte bilateral 3. Porokeratosis noted to  the bilateral feet x 4 4.  Ingrown toenails bilateral   Plan of Care:  1. Patient evaluated. X-Rays reviewed.  2. Excisional debridement of keratoic lesion using a chisel blade was performed without incident.  3. Dressed with light dressing. 4. Mechanical debridement of nails 1-5 bilaterally performed using a nail nipper. Filed with dremel without incident.  5. Patient is to return to the clinic in 3 months, if the patient does not feel significant improvement from conservative debridement of the nails we will consider ingrown toenail removal at that time.  *Patient currently caring for his wife who had a stroke.  He is also hard of hearing.  Edrick Kins, DPM [facility]  Dr. Edrick Kins, [address]                                        [place], [place] [address]                Office [phone]  Fax [phone]

## 2019-05-03 ENCOUNTER — Encounter: Payer: Self-pay | Admitting: Gastroenterology

## 2019-05-24 ENCOUNTER — Encounter: Payer: Self-pay | Admitting: Podiatry

## 2019-05-24 ENCOUNTER — Other Ambulatory Visit: Payer: Self-pay

## 2019-05-24 ENCOUNTER — Ambulatory Visit: Payer: Medicare Other | Admitting: Podiatry

## 2019-05-24 VITALS — Temp 98.4°F

## 2019-05-24 DIAGNOSIS — L989 Disorder of the skin and subcutaneous tissue, unspecified: Secondary | ICD-10-CM

## 2019-05-24 DIAGNOSIS — M79676 Pain in unspecified toe(s): Secondary | ICD-10-CM | POA: Diagnosis not present

## 2019-05-24 DIAGNOSIS — L600 Ingrowing nail: Secondary | ICD-10-CM

## 2019-05-24 DIAGNOSIS — B351 Tinea unguium: Secondary | ICD-10-CM | POA: Diagnosis not present

## 2019-05-24 MED ORDER — GENTAMICIN SULFATE 0.1 % EX CREA: 1.0000 "application " | TOPICAL_CREAM | Freq: Two times a day (BID) | CUTANEOUS | 1 refills | Status: DC

## 2019-05-26 NOTE — Progress Notes (Signed)
Subjective: Patient presents today for evaluation of pain to the medial border of the left hallux that began a few weeks ago. Patient is concerned for possible ingrown nail. Wearing shoes and applying pressure to the toe increases the pain. He has not had any treatment for his symptoms.  He is also here for follow up evaluation of painful callus lesions noted to the bilateral feet. Walking and bearing weight increases the pain. He has not had any recent treatment for the symptoms.  Patient also complains of elongated, thickened nails that cause pain while ambulating in shoes. He is unable to trim his own nails. Patient presents today for further treatment and evaluation.  Past Medical History:  Diagnosis Date  . CAD (coronary artery disease)    s/p PCI after presenting with exertional angina (drug eluting stent)  . Diverticulosis   . GERD (gastroesophageal reflux disease)   . HTN (hypertension)   . Hyperlipidemia   . Osteoarthritis   . PVD (peripheral vascular disease) ([place])    s/p aortobifemoral bypass, with bilateral SFA occlusion and intermittent claudication   . Small bowel obstruction (HCC)     Objective:  General: Well developed, nourished, in no acute distress, alert and oriented x3   Dermatology: Skin is warm, dry and supple bilateral. Medial border of the left hallux appears to be erythematous with evidence of an ingrowing nail. Pain on palpation noted to the border of the nail fold.  Hyperkeratotic lesions present on the bilateral feet. Pain on palpation with a central nucleated core noted. Negative for open lesions or macerations. Nails are tender, long, thickened and dystrophic with subungual debris, consistent with onychomycosis, 1-5 bilateral. No signs of infection noted.  Vascular: Dorsalis Pedis artery and Posterior Tibial artery pedal pulses palpable. No lower extremity edema noted.   Neruologic: Grossly intact via light touch bilateral.  Musculoskeletal: Pain on  palpation at the keratotic lesions noted. Muscular strength within normal limits in all groups bilateral. Normal range of motion noted to all pedal and ankle joints.   Assesement: #1 Paronychia with ingrowing nail medial border left hallux #2 Pain in toe #3 Incurvated nail #4 Onychodystrophic nails 1-5 bilateral with hyperkeratosis of nails.  #5 Onychomycosis of nail due to dermatophyte bilateral #6 Pre-ulcerative callus lesions noted to the bilateral feet x 4   Plan of Care:  1. Patient evaluated.  2. Discussed treatment alternatives and plan of care. Explained nail avulsion procedure and post procedure course to patient. 3. Patient opted for permanent partial nail avulsion.  4. Prior to procedure, local anesthesia infiltration utilized using 3 ml of a 50:50 mixture of 2% plain lidocaine and 0.5% plain marcaine in a normal hallux block fashion and a betadine prep performed.  5. Partial permanent nail avulsion with chemical matrixectomy performed using [id number] applications of phenol followed by alcohol flush. Light dressing applied. 6. Excisional debridement of keratoic lesion using a chisel blade was performed without incident. Dressed with light dressing.  7. Mechanical debridement of nails 1-5 bilaterally performed using a nail nipper. Filed with dremel without incident.  8. Prescription for Gentamicin cream provided to patient to use daily with a bandage.  9. Patient is to return to the clinic in 3 months.     Edrick Kins, DPM [facility]  Dr. Edrick Kins, DPM    [address]  [place], [place] [address]                Office [phone]  Fax [phone]

## 2019-06-03 ENCOUNTER — Telehealth: Payer: Self-pay | Admitting: Physical Medicine & Rehabilitation

## 2019-06-03 NOTE — Telephone Encounter (Signed)
PT asked as he was checking out his wife to schedule him an appointment for a procedure. He was seen for New Patient appointment on 3/16. His procedure for 4/21 was cancelled. He said he didn't want to come for a follow up. He wanted to schedule a procedure. What do you want me to schedule?

## 2019-06-03 NOTE — Telephone Encounter (Signed)
Please schedule for right L3-L4 5 MBB

## 2019-06-06 NOTE — Telephone Encounter (Signed)
Patient placed on the schedule for procedure, called and preformed a verbal Pre-procedure checklist with patient.

## 2019-06-06 NOTE — Telephone Encounter (Signed)
Forwarded message to [employer]

## 2019-06-23 ENCOUNTER — Encounter: Payer: Self-pay | Admitting: Physical Medicine & Rehabilitation

## 2019-06-23 ENCOUNTER — Other Ambulatory Visit: Payer: Self-pay

## 2019-06-23 ENCOUNTER — Encounter: Payer: Medicare Other | Attending: Physical Medicine & Rehabilitation | Admitting: Physical Medicine & Rehabilitation

## 2019-06-23 VITALS — BP 108/69 | HR 83 | Temp 98.1°F | Ht 71.0 in | Wt 211.0 lb

## 2019-06-23 DIAGNOSIS — M47816 Spondylosis without myelopathy or radiculopathy, lumbar region: Secondary | ICD-10-CM | POA: Diagnosis present

## 2019-06-23 NOTE — Patient Instructions (Addendum)
Please stand for 10 to 15 minutes when you get home and see whether the usual pain that occurs is better   Lumbar medial branch blocks were performed. This is to help diagnose the cause of the low back pain. It is important that you keep track of your pain for the first day or 2 after injection. This injection can give you temporary relief that lasts for hours or up to several months. There is no way to predict duration of pain relief.  Please try to compare your pain after injection to for the injection.  If this injection gives you  temporary relief there may be another longer-lasting procedure that may be beneficial call radiofrequency ablation

## 2019-06-23 NOTE — Progress Notes (Signed)
Right lumbar L3, L4 medial branch blocks and L5 dorsal ramus injection under fluoroscopic guidance  Indication: Right Lumbar pain which is not relieved by medication management or other conservative care and interfering with self-care and mobility.  Informed consent was obtained after describing risks and benefits of the procedure with the patient, this includes bleeding, bruising, infection, paralysis and medication side effects. The patient wishes to proceed and has given written consent. The patient was placed in a prone position. The lumbar area was marked and prepped with Betadine. One ML of 1% lidocaine was injected into each of 3 areas into the skin and subcutaneous tissue. Then a 22-gauge 3.5 in spinal needle was inserted targeting the junction of the Right S1 superior articular process and sacral ala junction. Needle was advanced under fluoroscopic guidance. Bone contact was made.Isovue 200 was injected x0.5 mL demonstrating no intravascular uptake. Then a solution containing 2% MPF lidocaine was injected x0.5 mL. Then the Right L5 superior articular process in transverse process junction was targeted. Bone contact was made.Isovue 200 was injected x0.5 mL demonstrating no intravascular uptake. Then a solution containing 2% MPF lidocaine was injected x0.5 mL. Then the Right L4 superior articular process in transverse process junction was targeted. Bone contact was made. Isovue 200 was injected x0.5 mL demonstrating no intravascular uptake. Then a solution containing2% MPF lidocaine was injected x0.5 mL Patient tolerated procedure well. Post procedure instructions were given. Please refer to post procedure form. 

## 2019-06-23 NOTE — Progress Notes (Signed)
  PROCEDURE RECORD [facility]   Name: Brandon Lewis DOB:October 01, 1934 MRN: [id number]  Date:06/23/2019  Physician: Alysia Penna, MD    Nurse/CMA: Truman Hayward CMA / Jakira Mcfadden CMA  Allergies:  Allergies  Allergen Reactions  . Cilostazol     REACTION: stomach problems- constipation  . Simvastatin     REACTION: myalgia    Consent Signed: Yes.    Is patient diabetic? No.  CBG today? NA  Pregnant: No. LMP: No LMP for male patient. (age 26-55)  Anticoagulants: yes (plavix - last dose yesterday) Anti-inflammatory: no Antibiotics: no  Procedure: Right L3-5 MBB Position: Prone Start Time: 3:14pm End Time: 3:21pm Fluoro Time: 31  RN/CMA Anea Fodera CMA Lee CMA    Time 235pm     BP 108/69     Pulse 83     Respirations 16 16    O2 Sat 97     S/S 6 6    Pain Level 1/10 /10     D/C home with Self, patient A & O X 3, D/C instructions reviewed, and sits independently.

## 2019-07-21 ENCOUNTER — Other Ambulatory Visit: Payer: Self-pay

## 2019-07-21 ENCOUNTER — Encounter: Payer: Self-pay | Admitting: Physical Medicine & Rehabilitation

## 2019-07-21 ENCOUNTER — Encounter: Payer: Medicare Other | Attending: Physical Medicine & Rehabilitation | Admitting: Physical Medicine & Rehabilitation

## 2019-07-21 DIAGNOSIS — M47816 Spondylosis without myelopathy or radiculopathy, lumbar region: Secondary | ICD-10-CM

## 2019-07-21 NOTE — Progress Notes (Signed)
Right lumbar L3, L4 medial branch blocks and L5 dorsal ramus injection under fluoroscopic guidance  Indication: Right Lumbar pain which is not relieved by medication management or other conservative care and interfering with self-care and mobility.  Informed consent was obtained after describing risks and benefits of the procedure with the patient, this includes bleeding, bruising, infection, paralysis and medication side effects. The patient wishes to proceed and has given written consent. The patient was placed in a prone position. The lumbar area was marked and prepped with Betadine. One ML of 1% lidocaine was injected into each of 3 areas into the skin and subcutaneous tissue. Then a 22-gauge 3.5 in spinal needle was inserted targeting the junction of the Right S1 superior articular process and sacral ala junction. Needle was advanced under fluoroscopic guidance. Bone contact was made.Isovue 200 was injected x0.5 mL demonstrating no intravascular uptake. Then a solution containing 2% MPF lidocaine was injected x0.5 mL. Then the Right L5 superior articular process in transverse process junction was targeted. Bone contact was made.Isovue 200 was injected x0.5 mL demonstrating no intravascular uptake. Then a solution containing 2% MPF lidocaine was injected x0.5 mL. Then the Right L4 superior articular process in transverse process junction was targeted. Bone contact was made. Isovue 200 was injected x0.5 mL demonstrating no intravascular uptake. Then a solution containing2% MPF lidocaine was injected x0.5 mL Patient tolerated procedure well. Post procedure instructions were given. Please refer to post procedure form. 

## 2019-07-21 NOTE — Progress Notes (Signed)
  PROCEDURE RECORD [facility]   Name: Brandon Lewis DOB:September 17, 1934 MRN: [id number]  Date:07/21/2019  Physician: Alysia Penna, MD    Nurse/CMA: Bright, CMA  Allergies:  Allergies  Allergen Reactions  . Cilostazol     REACTION: stomach problems- constipation  . Simvastatin     REACTION: myalgia    Consent Signed: Yes.    Is patient diabetic? No.  CBG today? NA  Pregnant: No. LMP: No LMP for male patient. (age 83-55)  Anticoagulants: yes (plavix) Anti-inflammatory: no Antibiotics: no  Procedure: right L3,4,5 medial branch block   Position: Prone   Start Time: 3:10pm End Time: 3:16pm Fluoro Time: 35s  RN/CMA Demoni Parmar, CMA Bright, CMA    Time 2:55pm 320pm    BP 110/84 109/63    Pulse 77 77    Respirations 14 14    O2 Sat 98 98    S/S 6 6    Pain Level 3/10 0/10     D/C home with self-patient A & O X 3, D/C instructions reviewed, and sits independently.

## 2019-07-21 NOTE — Patient Instructions (Signed)

## 2019-08-18 ENCOUNTER — Encounter: Payer: Medicare Other | Admitting: Physical Medicine & Rehabilitation

## 2019-08-18 ENCOUNTER — Other Ambulatory Visit: Payer: Self-pay

## 2019-08-18 ENCOUNTER — Encounter: Payer: Self-pay | Admitting: Physical Medicine & Rehabilitation

## 2019-08-18 VITALS — BP 127/70 | HR 86 | Resp 14 | Ht 71.0 in | Wt 200.0 lb

## 2019-08-18 DIAGNOSIS — M47816 Spondylosis without myelopathy or radiculopathy, lumbar region: Secondary | ICD-10-CM | POA: Diagnosis not present

## 2019-08-18 NOTE — Progress Notes (Signed)
  PROCEDURE RECORD [facility]   Name: Brandon Lewis DOB:[birth year] MRN: [id number]  Date:08/18/2019  Physician: Alysia Penna, MD    Nurse/CMA: Jatavia Keltner, CMA  Allergies:  Allergies  Allergen Reactions  . Cilostazol     REACTION: stomach problems- constipation  . Simvastatin     REACTION: myalgia    Consent Signed: Yes.    Is patient diabetic? No.  CBG today?   Pregnant: No. LMP: No LMP for male patient. (age 83-55)  Anticoagulants: no Anti-inflammatory: no Antibiotics: no  Procedure: right L3,4,5  radiofrequency neurotomy Position: Prone Start Time: 3:21pm  End Time:  3:40pm       Fluoro Time: 45s  RN/CMA Jabin Tapp, CMA  Takiera Mayo, CMA    Time 3:00pm 3:42pm    BP 127/70 162/72    Pulse 86 71    Respirations 14 14    O2 Sat 97 98    S/S 6 6    Pain Level 4/10 1/10     D/C home with self , patient A & O X 3, D/C instructions reviewed, and sits independently.

## 2019-08-18 NOTE — Progress Notes (Signed)

## 2019-08-18 NOTE — Patient Instructions (Signed)
You had a radio frequency procedure today This was done to alleviate joint pain in your lumbar area We injected lidocaine which is a local anesthetic.  You may experience soreness at the injection sites. You may also experienced some irritation of the nerves that were heated I'm recommending ice for 30 minutes every 2 hours as needed for the next 24-48 hours   

## 2019-08-23 ENCOUNTER — Other Ambulatory Visit: Payer: Self-pay

## 2019-08-23 ENCOUNTER — Encounter: Payer: Self-pay | Admitting: Podiatry

## 2019-08-23 ENCOUNTER — Ambulatory Visit: Payer: Medicare Other | Admitting: Podiatry

## 2019-08-23 DIAGNOSIS — M79676 Pain in unspecified toe(s): Secondary | ICD-10-CM | POA: Diagnosis not present

## 2019-08-23 DIAGNOSIS — B351 Tinea unguium: Secondary | ICD-10-CM | POA: Diagnosis not present

## 2019-08-23 DIAGNOSIS — L989 Disorder of the skin and subcutaneous tissue, unspecified: Secondary | ICD-10-CM

## 2019-08-26 NOTE — Progress Notes (Signed)
    Subjective: Patient is a 85 y.o. male presenting to the office today for follow up evaluation of painful callus lesion(s) noted to the bilateral feet. Walking and bearing weight increases the pain. He has not had any recent treatment.  Patient also complains of elongated, thickened nails that cause pain while ambulating in shoes. He is unable to trim his own nails. Patient presents today for further treatment and evaluation.  Past Medical History:  Diagnosis Date  . CAD (coronary artery disease)    s/p PCI after presenting with exertional angina (drug eluting stent)  . Diverticulosis   . GERD (gastroesophageal reflux disease)   . HTN (hypertension)   . Hyperlipidemia   . Osteoarthritis   . PVD (peripheral vascular disease) ([place])    s/p aortobifemoral bypass, with bilateral SFA occlusion and intermittent claudication   . Small bowel obstruction (HCC)     Objective:  Physical Exam General: Alert and oriented x3 in no acute distress  Dermatology: Hyperkeratotic lesion(s) present on the bilateral feet. Pain on palpation with a central nucleated core noted. Skin is warm, dry and supple bilateral lower extremities. Negative for open lesions or macerations. Nails are tender, long, thickened and dystrophic with subungual debris, consistent with onychomycosis, 1-5 bilateral. No signs of infection noted.  Vascular: Palpable pedal pulses bilaterally. No edema or erythema noted. Capillary refill within normal limits.  Neurological: Epicritic and protective threshold grossly intact bilaterally.   Musculoskeletal Exam: Pain on palpation at the keratotic lesion(s) noted. Range of motion within normal limits bilateral. Muscle strength 5/5 in all groups bilateral.  Assessment: 1. Onychodystrophic nails 1-5 bilateral with hyperkeratosis of nails.  2. Onychomycosis of nail due to dermatophyte bilateral 3. Pre-ulcerative callus lesions noted to the bilateral feet x 4   Plan of Care:  1.  Patient evaluated. 2. Excisional debridement of keratoic lesion(s) using a chisel blade was performed without incident.  3. Dressed with light dressing. 4. Mechanical debridement of nails 1-5 bilaterally performed using a nail nipper. Filed with dremel without incident.  5. Patient is to return to the clinic in 3 months.   Edrick Kins, DPM [facility]  Dr. Edrick Kins, [address]                                        [place], [place] [address]                Office [phone]  Fax [phone]

## 2019-09-11 ENCOUNTER — Other Ambulatory Visit: Payer: Self-pay

## 2019-09-11 ENCOUNTER — Emergency Department: Payer: Medicare Other

## 2019-09-11 ENCOUNTER — Inpatient Hospital Stay
Admission: EM | Admit: 2019-09-11 | Discharge: 2019-09-12 | DRG: 282 | Payer: Medicare Other | Attending: Internal Medicine | Admitting: Internal Medicine

## 2019-09-11 ENCOUNTER — Inpatient Hospital Stay
Admit: 2019-09-11 | Discharge: 2019-09-11 | Disposition: A | Discharge status: Home / Self Care | Payer: Medicare Other | Attending: Family Medicine | Admitting: Family Medicine

## 2019-09-11 DIAGNOSIS — I493 Ventricular premature depolarization: Secondary | ICD-10-CM | POA: Diagnosis not present

## 2019-09-11 DIAGNOSIS — I249 Acute ischemic heart disease, unspecified: Secondary | ICD-10-CM | POA: Diagnosis not present

## 2019-09-11 DIAGNOSIS — Z951 Presence of aortocoronary bypass graft: Secondary | ICD-10-CM | POA: Diagnosis not present

## 2019-09-11 DIAGNOSIS — M1990 Unspecified osteoarthritis, unspecified site: Secondary | ICD-10-CM | POA: Diagnosis present

## 2019-09-11 DIAGNOSIS — I272 Pulmonary hypertension, unspecified: Secondary | ICD-10-CM | POA: Diagnosis present

## 2019-09-11 DIAGNOSIS — I10 Essential (primary) hypertension: Secondary | ICD-10-CM | POA: Diagnosis present

## 2019-09-11 DIAGNOSIS — N179 Acute kidney failure, unspecified: Secondary | ICD-10-CM | POA: Diagnosis not present

## 2019-09-11 DIAGNOSIS — Z9842 Cataract extraction status, left eye: Secondary | ICD-10-CM | POA: Diagnosis not present

## 2019-09-11 DIAGNOSIS — I70209 Unspecified atherosclerosis of native arteries of extremities, unspecified extremity: Secondary | ICD-10-CM | POA: Diagnosis present

## 2019-09-11 DIAGNOSIS — E785 Hyperlipidemia, unspecified: Secondary | ICD-10-CM | POA: Diagnosis not present

## 2019-09-11 DIAGNOSIS — N189 Chronic kidney disease, unspecified: Secondary | ICD-10-CM | POA: Diagnosis not present

## 2019-09-11 DIAGNOSIS — I4892 Unspecified atrial flutter: Secondary | ICD-10-CM | POA: Diagnosis not present

## 2019-09-11 DIAGNOSIS — Z9049 Acquired absence of other specified parts of digestive tract: Secondary | ICD-10-CM

## 2019-09-11 DIAGNOSIS — Z8249 Family history of ischemic heart disease and other diseases of the circulatory system: Secondary | ICD-10-CM

## 2019-09-11 DIAGNOSIS — Z7951 Long term (current) use of inhaled steroids: Secondary | ICD-10-CM

## 2019-09-11 DIAGNOSIS — K219 Gastro-esophageal reflux disease without esophagitis: Secondary | ICD-10-CM | POA: Diagnosis present

## 2019-09-11 DIAGNOSIS — Z79899 Other long term (current) drug therapy: Secondary | ICD-10-CM | POA: Diagnosis not present

## 2019-09-11 DIAGNOSIS — R079 Chest pain, unspecified: Secondary | ICD-10-CM

## 2019-09-11 DIAGNOSIS — N1831 Chronic kidney disease, stage 3a: Secondary | ICD-10-CM | POA: Diagnosis present

## 2019-09-11 DIAGNOSIS — I214 Non-ST elevation (NSTEMI) myocardial infarction: Principal | ICD-10-CM | POA: Diagnosis present

## 2019-09-11 DIAGNOSIS — I739 Peripheral vascular disease, unspecified: Secondary | ICD-10-CM | POA: Diagnosis not present

## 2019-09-11 DIAGNOSIS — D62 Acute posthemorrhagic anemia: Secondary | ICD-10-CM | POA: Diagnosis not present

## 2019-09-11 DIAGNOSIS — Z955 Presence of coronary angioplasty implant and graft: Secondary | ICD-10-CM | POA: Diagnosis not present

## 2019-09-11 DIAGNOSIS — R609 Edema, unspecified: Secondary | ICD-10-CM | POA: Diagnosis not present

## 2019-09-11 DIAGNOSIS — K5790 Diverticulosis of intestine, part unspecified, without perforation or abscess without bleeding: Secondary | ICD-10-CM | POA: Diagnosis present

## 2019-09-11 DIAGNOSIS — Z20828 Contact with and (suspected) exposure to other viral communicable diseases: Secondary | ICD-10-CM | POA: Diagnosis present

## 2019-09-11 DIAGNOSIS — I25110 Atherosclerotic heart disease of native coronary artery with unstable angina pectoris: Secondary | ICD-10-CM | POA: Diagnosis present

## 2019-09-11 DIAGNOSIS — J309 Allergic rhinitis, unspecified: Secondary | ICD-10-CM | POA: Diagnosis present

## 2019-09-11 DIAGNOSIS — Z823 Family history of stroke: Secondary | ICD-10-CM

## 2019-09-11 DIAGNOSIS — Z833 Family history of diabetes mellitus: Secondary | ICD-10-CM

## 2019-09-11 DIAGNOSIS — I255 Ischemic cardiomyopathy: Secondary | ICD-10-CM | POA: Diagnosis not present

## 2019-09-11 DIAGNOSIS — Z7902 Long term (current) use of antithrombotics/antiplatelets: Secondary | ICD-10-CM | POA: Diagnosis not present

## 2019-09-11 DIAGNOSIS — Z87891 Personal history of nicotine dependence: Secondary | ICD-10-CM

## 2019-09-11 DIAGNOSIS — E7800 Pure hypercholesterolemia, unspecified: Secondary | ICD-10-CM | POA: Diagnosis not present

## 2019-09-11 DIAGNOSIS — Z888 Allergy status to other drugs, medicaments and biological substances status: Secondary | ICD-10-CM

## 2019-09-11 DIAGNOSIS — Z9841 Cataract extraction status, right eye: Secondary | ICD-10-CM | POA: Diagnosis not present

## 2019-09-11 DIAGNOSIS — I200 Unstable angina: Secondary | ICD-10-CM | POA: Diagnosis not present

## 2019-09-11 DIAGNOSIS — I2510 Atherosclerotic heart disease of native coronary artery without angina pectoris: Secondary | ICD-10-CM | POA: Diagnosis not present

## 2019-09-11 DIAGNOSIS — I129 Hypertensive chronic kidney disease with stage 1 through stage 4 chronic kidney disease, or unspecified chronic kidney disease: Secondary | ICD-10-CM | POA: Diagnosis present

## 2019-09-11 DIAGNOSIS — R062 Wheezing: Secondary | ICD-10-CM | POA: Diagnosis not present

## 2019-09-11 DIAGNOSIS — I2583 Coronary atherosclerosis due to lipid rich plaque: Secondary | ICD-10-CM | POA: Diagnosis not present

## 2019-09-11 HISTORY — DX: Non-ST elevation (NSTEMI) myocardial infarction: I21.4

## 2019-09-11 LAB — CBC
HCT: 30.4 % — ABNORMAL LOW (ref 39.0–52.0)
Hemoglobin: 9.6 g/dL — ABNORMAL LOW (ref 13.0–17.0)
MCH: 37.6 pg — ABNORMAL HIGH (ref 26.0–34.0)
MCHC: 31.6 g/dL (ref 30.0–36.0)
MCV: 119.2 fL — ABNORMAL HIGH (ref 80.0–100.0)
Platelets: 148 10*3/uL — ABNORMAL LOW (ref 150–400)
RBC: 2.55 MIL/uL — ABNORMAL LOW (ref 4.22–5.81)
RDW: 16.9 % — ABNORMAL HIGH (ref 11.5–15.5)
WBC: 8.3 10*3/uL (ref 4.0–10.5)
nRBC: 0 % (ref 0.0–0.2)

## 2019-09-11 LAB — COMPREHENSIVE METABOLIC PANEL
ALT: 15 U/L (ref 0–44)
AST: 21 U/L (ref 15–41)
Albumin: 3.4 g/dL — ABNORMAL LOW (ref 3.5–5.0)
Alkaline Phosphatase: 67 U/L (ref 38–126)
Anion gap: 9 (ref 5–15)
BUN: 28 mg/dL — ABNORMAL HIGH (ref 8–23)
CO2: 24 mmol/L (ref 22–32)
Calcium: 9.5 mg/dL (ref 8.9–10.3)
Chloride: 108 mmol/L (ref 98–111)
Creatinine, Ser: 1.64 mg/dL — ABNORMAL HIGH (ref 0.61–1.24)
GFR calc Af Amer: 44 mL/min — ABNORMAL LOW (ref >60)
GFR calc non Af Amer: 38 mL/min — ABNORMAL LOW (ref >60)
Glucose, Bld: 201 mg/dL — ABNORMAL HIGH (ref 70–99)
Potassium: 4.2 mmol/L (ref 3.5–5.1)
Sodium: 141 mmol/L (ref 135–145)
Total Bilirubin: 0.6 mg/dL (ref 0.3–1.2)
Total Protein: 7.2 g/dL (ref 6.5–8.1)

## 2019-09-11 LAB — TROPONIN I (HIGH SENSITIVITY)
Troponin I (High Sensitivity): 17 ng/L (ref <18)
Troponin I (High Sensitivity): 69 ng/L — ABNORMAL HIGH (ref <18)
Troponin I (High Sensitivity): 81 ng/L — ABNORMAL HIGH (ref <18)
Troponin I (High Sensitivity): 74 ng/L — ABNORMAL HIGH (ref <18)

## 2019-09-11 LAB — ECHOCARDIOGRAM COMPLETE
Height: 70 in
Weight: 3200 oz

## 2019-09-11 LAB — LIPID PANEL
Cholesterol: 134 mg/dL (ref 0–200)
HDL: 32 mg/dL — ABNORMAL LOW (ref >40)
LDL Cholesterol: 92 mg/dL (ref 0–99)
Total CHOL/HDL Ratio: 4.2 RATIO
Triglycerides: 49 mg/dL (ref <150)
VLDL: 10 mg/dL (ref 0–40)

## 2019-09-11 LAB — HEPARIN LEVEL (UNFRACTIONATED)
Heparin Unfractionated: 0.24 IU/mL — ABNORMAL LOW (ref 0.30–0.70)
Heparin Unfractionated: 0.4 IU/mL (ref 0.30–0.70)

## 2019-09-11 LAB — APTT: aPTT: 29 seconds (ref 24–36)

## 2019-09-11 LAB — SARS CORONAVIRUS 2 (TAT 6-24 HRS): SARS Coronavirus 2: NEGATIVE

## 2019-09-11 LAB — PROTIME-INR
INR: 1 (ref 0.8–1.2)
Prothrombin Time: 13.5 seconds (ref 11.4–15.2)

## 2019-09-11 MED ORDER — ASPIRIN EC 325 MG PO TBEC
325.0000 mg | DELAYED_RELEASE_TABLET | Freq: Every day | ORAL | Status: DC
  Administered 2019-09-11: 11:00:00 325 mg via ORAL
  Filled 2019-09-11: qty 1

## 2019-09-11 MED ORDER — VITAMIN D 25 MCG (1000 UNIT) PO TABS: 1000.0000 [IU] | ORAL_TABLET | Freq: Every day | ORAL | Status: DC

## 2019-09-11 MED ORDER — ZOLPIDEM TARTRATE 5 MG PO TABS: 5.0000 mg | ORAL_TABLET | Freq: Every evening | ORAL | Status: DC | PRN

## 2019-09-11 MED ORDER — ROSUVASTATIN CALCIUM 10 MG PO TABS
10.0000 mg | ORAL_TABLET | Freq: Every day | ORAL | Status: DC
  Filled 2019-09-11: qty 1

## 2019-09-11 MED ORDER — HEPARIN BOLUS VIA INFUSION
1200.0000 [IU] | Freq: Once | INTRAVENOUS | Status: AC
  Administered 2019-09-11: 1200 [IU] via INTRAVENOUS
  Filled 2019-09-11: qty 1200

## 2019-09-11 MED ORDER — ACETAMINOPHEN 325 MG PO TABS: 650.0000 mg | ORAL_TABLET | ORAL | Status: DC | PRN

## 2019-09-11 MED ORDER — LIDOCAINE VISCOUS HCL 2 % MT SOLN
15.0000 mL | Freq: Once | OROMUCOSAL | Status: DC
  Filled 2019-09-11: qty 15

## 2019-09-11 MED ORDER — ALUM & MAG HYDROXIDE-SIMETH 200-200-20 MG/5ML PO SUSP: 30.0000 mL | Freq: Once | ORAL | Status: DC

## 2019-09-11 MED ORDER — CLOPIDOGREL BISULFATE 75 MG PO TABS
75.0000 mg | ORAL_TABLET | Freq: Every day | ORAL | Status: DC
  Administered 2019-09-11: 75 mg via ORAL
  Filled 2019-09-11: qty 1

## 2019-09-11 MED ORDER — SODIUM CHLORIDE 0.9 % IV SOLN
INTRAVENOUS | Status: DC
  Administered 2019-09-11 – 2019-09-12 (×3): via INTRAVENOUS

## 2019-09-11 MED ORDER — NITROGLYCERIN 0.4 MG/SPRAY TL SOLN: 1.0000 | Status: DC | PRN

## 2019-09-11 MED ORDER — ADULT MULTIVITAMIN W/MINERALS CH
1.0000 | ORAL_TABLET | Freq: Every day | ORAL | Status: DC
  Administered 2019-09-11: 1 via ORAL
  Filled 2019-09-11: qty 1

## 2019-09-11 MED ORDER — NITROGLYCERIN 0.4 MG SL SUBL
SUBLINGUAL_TABLET | SUBLINGUAL | Status: AC
  Filled 2019-09-11: qty 1

## 2019-09-11 MED ORDER — INFLUENZA VAC A&B SA ADJ QUAD 0.5 ML IM PRSY
0.5000 mL | PREFILLED_SYRINGE | INTRAMUSCULAR | Status: DC
  Filled 2019-09-11: qty 0.5

## 2019-09-11 MED ORDER — MORPHINE SULFATE (PF) 2 MG/ML IV SOLN: 2.0000 mg | INTRAVENOUS | Status: DC | PRN

## 2019-09-11 MED ORDER — HEPARIN BOLUS VIA INFUSION
4000.0000 [IU] | Freq: Once | INTRAVENOUS | Status: AC
  Administered 2019-09-11: 4000 [IU] via INTRAVENOUS
  Filled 2019-09-11: qty 4000

## 2019-09-11 MED ORDER — METOPROLOL SUCCINATE ER 50 MG PO TB24
50.0000 mg | ORAL_TABLET | Freq: Every day | ORAL | Status: DC
  Administered 2019-09-11: 11:00:00 50 mg via ORAL
  Filled 2019-09-11: qty 1

## 2019-09-11 MED ORDER — ONDANSETRON HCL 4 MG/2ML IJ SOLN: 4.0000 mg | Freq: Four times a day (QID) | INTRAMUSCULAR | Status: DC | PRN

## 2019-09-11 MED ORDER — HEPARIN (PORCINE) 25000 UT/250ML-% IV SOLN
1150.0000 [IU]/h | INTRAVENOUS | Status: DC
  Administered 2019-09-11: 1000 [IU]/h via INTRAVENOUS
  Administered 2019-09-11: 1150 [IU]/h via INTRAVENOUS
  Filled 2019-09-11 (×2): qty 250

## 2019-09-11 MED ORDER — NITROGLYCERIN 0.4 MG SL SUBL: 0.4000 mg | SUBLINGUAL_TABLET | SUBLINGUAL | Status: DC | PRN

## 2019-09-11 MED ORDER — SODIUM CHLORIDE 0.9% FLUSH: 3.0000 mL | Freq: Two times a day (BID) | INTRAVENOUS | Status: DC

## 2019-09-11 NOTE — ED Notes (Signed)
Critical troponin of 69 called from lab. Dr. Archie Balboa notified, no new verbal orders received.

## 2019-09-11 NOTE — H&P (Signed)
[name] NAME: Brandon Lewis    MR#:  [id number]  DATE OF BIRTH:  May 27, 1934  DATE OF ADMISSION:  09/11/2019  PRIMARY CARE PHYSICIAN: Venia Carbon, MD   REQUESTING/REFERRING PHYSICIAN: Nance Pear, MD CHIEF COMPLAINT:   Chief Complaint  Patient presents with  . Chest Pain    HISTORY OF PRESENT ILLNESS:  Brandon Lewis  is a 85 y.o. Caucasian male with a known history of coronary artery disease status post PCI and stent, hypertension, dyslipidemia and peripheral vascular disease, presented to the emergency room with acute onset of midsternal chest pain felt as pressure as well as tingling and burning and graded 7/10 in severity with associated diaphoresis without nausea or vomiting or palpitation or dyspnea.  He denied any cough or wheezing or hemoptysis.  No leg pain or edema recent travels or surgeries.  No fever or chills.  No recent sick exposures to COVID-19.  He had recurrent pain while he was in the ER.  Upon presentation to the emergency room, blood pressure was 144/86 with otherwise normal vital signs.  Labs revealed elevated BUN of 28 and creatinine 1.64 compared to 23 and 1.44 a year ago and high-sensitivity troponin I was 17 and repeat level was 69.  CBC showed anemia with hemoglobin 9.6 hematocrit 30.4 compared to 12.6 and 38.4 a year ago.  It did show macrocytosis.  EKG showed normal sinus rhythm with rate of 64 with anterolateral T wave inversion.  The patient was given IV heparin bolus and infusion in the ER.  He will be admitted to a telemetry bed for further evaluation and management. PAST MEDICAL HISTORY:   Past Medical History:  Diagnosis Date  . CAD (coronary artery disease)    s/p PCI after presenting with exertional angina (drug eluting stent)  . Diverticulosis   . GERD (gastroesophageal reflux disease)   . HTN (hypertension)   . Hyperlipidemia   . Osteoarthritis   . PVD (peripheral vascular disease) ([place])     s/p aortobifemoral bypass, with bilateral SFA occlusion and intermittent claudication   . Small bowel obstruction ([place])     PAST SURGICAL HISTORY:   Past Surgical History:  Procedure Laterality Date  . aorto-bifem  2003   [facility]   . CAROTID STENT     [facility]  . CATARACT EXTRACTION, BILATERAL  2017  . CHOLECYSTECTOMY  2002  . TONSILLECTOMY    . WISDOM TOOTH EXTRACTION      SOCIAL HISTORY:   Social History   Tobacco Use  . Smoking status: Former Smoker    Types: Cigarettes    Quit date: 10/20/1992    Years since quitting: 26.9  . Smokeless tobacco: Never Used  . Tobacco comment: quit in 1994   Substance Use Topics  . Alcohol use: Not Currently    Comment: rare    FAMILY HISTORY:   Family History  Problem Relation Age of Onset  . Diabetes Father        DM - and brother   . Stroke Father   . Coronary artery disease Paternal Grandfather   . Colon cancer Neg Hx   . Prostate cancer Neg Hx     DRUG ALLERGIES:   Allergies  Allergen Reactions  . Cilostazol     REACTION: stomach problems- constipation  . Simvastatin     REACTION: myalgia    REVIEW OF SYSTEMS:   ROS As per history of present illness. All pertinent systems were reviewed above.  Constitutional,  HEENT, cardiovascular, respiratory, GI, GU, musculoskeletal, neuro, psychiatric, endocrine,  integumentary and hematologic systems were reviewed and are otherwise  negative/unremarkable except for positive findings mentioned above in the HPI.   MEDICATIONS AT HOME:   Prior to Admission medications   Medication Sig Start Date End Date Taking? Authorizing Provider  cholecalciferol (VITAMIN D) 1000 UNITS tablet Take 1,000 Units by mouth daily.    [provider]  clopidogrel (PLAVIX) 75 MG tablet TAKE ONE TABLET BY MOUTH EVERY DAY *BOTTLE* 09/30/17   Venia Carbon, MD  fluticasone (FLONASE) 50 MCG/ACT nasal spray Place 2 sprays into the nose daily as needed.     [provider]   gentamicin cream (GARAMYCIN) 0.1 % Apply 1 application topically 2 (two) times daily. 05/24/19   Edrick Kins, DPM  losartan (COZAAR) 100 MG tablet TAKE ONE TABLET BY MOUTH EACH MORNING FOR BLOOD PRESSURE CONTROL. *BOTTLE* *BOTTLE* 04/06/18   Venia Carbon, MD  metoprolol succinate (TOPROL-XL) 50 MG 24 hr tablet **VIAL ONLY** TAKE (1) TABLET BY MOUTH ONCE A DAY.DO NOT CRUSH. (HIGHBLOOD PRESSURE) *BOTTLE* 04/06/18   Venia Carbon, MD  Multiple Vitamin (MULTIVITAMIN) tablet Take 1 tablet by mouth daily.      [provider]  Naproxen Sodium 220 MG CAPS Take 220-440 mg by mouth 2 (two) times daily as needed.    [provider]  rosuvastatin (CRESTOR) 10 MG tablet **VIAL ONLY** TAKE ONE TABLET BY MOUTH AT BEDTIME. (IMPROVES CHOLESTEROL) 09/30/17   Viviana Simpler I, MD  sildenafil (REVATIO) 20 MG tablet TAKE 3-5 TABLETS BY MOUTH DAILY AS NEEDED 08/24/17   Venia Carbon, MD      VITAL SIGNS:  Blood pressure (!) 149/88, pulse 82, temperature 97.8 F (36.6 C), temperature source Oral, resp. rate 15, height 5\' 10"  (1.778 m), weight 90.7 kg, SpO2 100 %.  PHYSICAL EXAMINATION:  Physical Exam  GENERAL:  85 y.o.-year-old Caucasian male patient lying in the bed with no acute distress.  EYES: Pupils equal, round, reactive to light and accommodation. No scleral icterus. Extraocular muscles intact.  HEENT: Head atraumatic, normocephalic. Oropharynx and nasopharynx clear.  NECK:  Supple, no jugular venous distention. No thyroid enlargement, no tenderness.  LUNGS: Normal breath sounds bilaterally, no wheezing, rales,rhonchi or crepitation. No use of accessory muscles of respiration.  CARDIOVASCULAR: Regular rate and rhythm, S1, S2 normal. No murmurs, rubs, or gallops.  ABDOMEN: Soft, nondistended, nontender. Bowel sounds present. No organomegaly or mass.  EXTREMITIES: No pedal edema, cyanosis, or clubbing.  NEUROLOGIC: Cranial nerves II through XII are intact. Muscle strength 5/5  in all extremities. Sensation intact. Gait not checked.  PSYCHIATRIC: The patient is alert and oriented x 3.  Normal affect and good eye contact. SKIN: No obvious rash, lesion, or ulcer.   LABORATORY PANEL:   CBC Recent Labs  Lab 09/11/19 0139  WBC 8.3  HGB 9.6*  HCT 30.4*  PLT 148*   ------------------------------------------------------------------------------------------------------------------  Chemistries  Recent Labs  Lab 09/11/19 0139  NA 141  K 4.2  CL 108  CO2 24  GLUCOSE 201*  BUN 28*  CREATININE 1.64*  CALCIUM 9.5  AST 21  ALT 15  ALKPHOS 67  BILITOT 0.6   ------------------------------------------------------------------------------------------------------------------  Cardiac Enzymes No results for input(s): TROPONINI in the last 168 hours. ------------------------------------------------------------------------------------------------------------------  RADIOLOGY:  Dg Chest 2 View  Result Date: 09/11/2019 CLINICAL DATA:  Chest pain EXAM: CHEST - 2 VIEW COMPARISON:  None. FINDINGS: No consolidation or effusion. Scarring or atelectasis at the  lingula. Normal heart size. No pneumothorax. Chronic appearing wedging deformities at the thoracolumbar junction. IMPRESSION: No active cardiopulmonary disease. Scarring or atelectasis at the lingula. Electronically Signed   By: Donavan Foil M.D.   On: 09/11/2019 02:23      IMPRESSION AND PLAN:   1.  Chest pain with elevated troponin concerning for acute coronary syndrome/ unstable angina especially given recurrent chest pain in the setting of coronary artery disease.  He will be admitted to a telemetry bed.  We will follow serial cardiac enzymes and EKGs.  We will obtain a cardiology consultation by Dr. [name] was notified about the patient will check a 2D echo and place the patient on as needed sublingual nitroglycerin and morphine sulfate as well as schedule aspirin.  2.  Hypertension.  We will continue  Toprol-XL and hold Cozaar.  3.  Mild acute kidney injury on top of stage IIIa chronic kidney disease.  He will be hydrated with IV normal saline and will follow BMP.  We will hold off Cozaar and Naprosyn.  4.  Dyslipidemia.  We will continue Crestor.  5.  Pulmonary hypertension.  We will hold off revised CO for now.  6.  DVT prophylaxis.  Patient will be placed on IV heparin drip.  All the records are reviewed and case discussed with ED provider. The plan of care was discussed in details with the patient (and family). I answered all questions. The patient agreed to proceed with the above mentioned plan. Further management will depend upon hospital course.   CODE STATUS: Full code  TOTAL TIME TAKING CARE OF THIS PATIENT: 55 minutes.    Christel Mormon M.D on 09/11/2019 at 6:17 AM  [facility]   From 7 PM-7 AM, contact night-coverage [email or web address]  CC: Primary care physician; Venia Carbon, MD   Note: This dictation was prepared with Dragon dictation along with smaller phrase technology. Any transcriptional errors that result from this process are unintentional.

## 2019-09-11 NOTE — ED Triage Notes (Signed)
Pt with onset of chest pain, central to left sided, non  Radiating at 0015 today. Pt took 650mg  asa po and one ntg with relief of pain. Pt with history of stentx2, very hard of hearing. Pt denies shob, but states was dizzy.

## 2019-09-11 NOTE — Progress Notes (Signed)
ANTICOAGULATION CONSULT NOTE - Initial Consult  Pharmacy Consult for heparin Indication: chest pain/ACS  Allergies  Allergen Reactions  . Cilostazol     REACTION: stomach problems- constipation  . Simvastatin     REACTION: myalgia    Patient Measurements: Height: 5\' 10"  (177.8 cm) Weight: 200 lb (90.7 kg) IBW/kg (Calculated) : 73 Heparin Dosing Weight: 81 kg  Vital Signs: Temp: 97.8 F (36.6 C) (11/22 0136) Temp Source: Oral (11/22 0136) BP: 155/90 (11/22 0539) Pulse Rate: 85 (11/22 0539)  Labs: Recent Labs    09/11/19 0139 09/11/19 0334  HGB 9.6*  --   HCT 30.4*  --   PLT 148*  --   CREATININE 1.64*  --   TROPONINIHS 17 69*    Estimated Creatinine Clearance: 38.7 mL/min (A) (by C-G formula based on SCr of 1.64 mg/dL (H)).   Medical History: Past Medical History:  Diagnosis Date  . CAD (coronary artery disease)    s/p PCI after presenting with exertional angina (drug eluting stent)  . Diverticulosis   . GERD (gastroesophageal reflux disease)   . HTN (hypertension)   . Hyperlipidemia   . Osteoarthritis   . PVD (peripheral vascular disease) ([place])    s/p aortobifemoral bypass, with bilateral SFA occlusion and intermittent claudication   . Small bowel obstruction (HCC)     Medications:  Scheduled:  . heparin  4,000 Units Intravenous Once    Assessment: Patient arrives to ED w/ c/o LS CP non-radiating w/ h/o CAD x 2 stents and PVD. Patient's initial trop was 33 rose to 69. EKG showing some ST depression w/ T wave abnormalities. Patient does not appear to be on anticoagulants PTA except for plavix  Goal of Therapy:   Heparin level 0.3-0.7 units/ml Monitor platelets by anticoagulation protocol: Yes   Plan:  Will bolus heparin 4000 units IV x 1 Will start heparin drip rate at 1000 units/hr. Will check anti-Xa at 1400. Tonight's H/h lower than patient's baseline. APTT, INR pending. Will monitor daily CBC's and adjust per anti-Xa levels.  Tobie Lords, PharmD, BCPS Clinical Pharmacist 09/11/2019,5:56 AM

## 2019-09-11 NOTE — Progress Notes (Signed)
ANTICOAGULATION CONSULT NOTE - Initial Consult  Pharmacy Consult for heparin Indication: chest pain/ACS  Allergies  Allergen Reactions  . Cilostazol     REACTION: stomach problems- constipation  . Simvastatin     REACTION: myalgia    Patient Measurements: Height: 5\' 10"  (177.8 cm) Weight: 200 lb 2.8 oz (90.8 kg) IBW/kg (Calculated) : 73 Heparin Dosing Weight: 81 kg  Vital Signs: Temp: 97.9 F (36.6 C) (11/22 1430) Temp Source: Oral (11/22 1430) BP: 151/82 (11/22 1430) Pulse Rate: 72 (11/22 1430)  Labs: Recent Labs    09/11/19 0139 09/11/19 0334 09/11/19 0559 09/11/19 0634 09/11/19 0806 09/11/19 1348  HGB 9.6*  --   --   --   --   --   HCT 30.4*  --   --   --   --   --   PLT 148*  --   --   --   --   --   APTT  --   --  29  --   --   --   LABPROT  --   --  13.5  --   --   --   INR  --   --  1.0  --   --   --   HEPARINUNFRC  --   --   --   --   --  0.24*  CREATININE 1.64*  --   --   --   --   --   TROPONINIHS 17 69*  --  81* 74*  --     Estimated Creatinine Clearance: 38.7 mL/min (A) (by C-G formula based on SCr of 1.64 mg/dL (H)).   Medical History: Past Medical History:  Diagnosis Date  . CAD (coronary artery disease)    s/p PCI after presenting with exertional angina (drug eluting stent)  . Diverticulosis   . GERD (gastroesophageal reflux disease)   . HTN (hypertension)   . Hyperlipidemia   . Osteoarthritis   . PVD (peripheral vascular disease) ([place])    s/p aortobifemoral bypass, with bilateral SFA occlusion and intermittent claudication   . Small bowel obstruction (HCC)     Medications:  Scheduled:  . alum & mag hydroxide-simeth  30 mL Oral Once   And  . lidocaine  15 mL Oral Once  . aspirin EC  325 mg Oral Daily  . cholecalciferol  1,000 Units Oral Daily  . clopidogrel  75 mg Oral Daily  . heparin  1,200 Units Intravenous Once  . metoprolol succinate  50 mg Oral Daily  . multivitamin with minerals  1 tablet Oral Daily  . rosuvastatin  10  mg Oral q1800    Assessment: Patient arrives to ED w/ c/o LS CP non-radiating w/ h/o CAD x 2 stents and PVD. Patient's initial trop was 81 rose to 69. EKG showing some ST depression w/ T wave abnormalities. Patient does not appear to be on anticoagulants PTA except for plavix  Goal of Therapy:   Heparin level 0.3-0.7 units/ml Monitor platelets by anticoagulation protocol: Yes   Plan:  11/22 @ 1348  HL = 0.24.  Ordered Heparin bolus of 1200 units and increased drip rate to 1150 units/hr.  Recheck HL in 8 hours tonight at 23:00.  Will monitor daily CBC's and adjust per anti-Xa levels.  Olivia Canter, [facility] Clinical Pharmacist 09/11/2019,2:58 PM

## 2019-09-11 NOTE — ED Provider Notes (Signed)
[facility] Emergency Department Provider Note   ____________________________________________   I have reviewed the triage vital signs and the nursing notes.   HISTORY  Chief Complaint Chest Pain   History limited by: Not Limited   HPI Brandon Lewis is a 85 y.o. male who presents to the emergency department today because of concern for chest pain. The patient states he was lying in bed when the pain started. The pain was located in his left upper chest. The patient did take two full dose aspirin and had some old nitroglycerin that he took. The pain did improve over the course of 15 minutes. He denies any associated shortness of breath but did have some diaphoresis. The patient has distant history of stent placement. Says last had chest pain a number of years ago. Denies any fevers or recent illness.    Records reviewed. Per medical record review patient has a history of CAD  Past Medical History:  Diagnosis Date  . CAD (coronary artery disease)    s/p PCI after presenting with exertional angina (drug eluting stent)  . Diverticulosis   . GERD (gastroesophageal reflux disease)   . HTN (hypertension)   . Hyperlipidemia   . Osteoarthritis   . PVD (peripheral vascular disease) ([place])    s/p aortobifemoral bypass, with bilateral SFA occlusion and intermittent claudication   . Small bowel obstruction [facility])     Patient Active Problem List   Diagnosis Date Noted  . Sciatica, right side 09/22/2018  . Chronic renal disease, stage III 09/14/2018  . Right hip pain 09/13/2018  . Thrombocytopenia ([place]) 08/25/2017  . Actinic keratosis 08/14/2015  . Advanced directives, counseling/discussion 07/25/2014  . Routine general medical examination at a health care facility 06/25/2012  . GERD (gastroesophageal reflux disease)   . IMPAIRED FASTING GLUCOSE 03/08/2010  . CAD, NATIVE VESSEL 05/30/2009  . Hyperlipemia 03/02/2009  . Essential hypertension, benign  09/22/2007  . Atherosclerotic peripheral vascular disease ([place]) 09/22/2007  . Osteoarthritis, generalized 09/22/2007    Past Surgical History:  Procedure Laterality Date  . aorto-bifem  2003   hayes   . CAROTID STENT     [facility]  . CATARACT EXTRACTION, BILATERAL  2017  . CHOLECYSTECTOMY  2002  . TONSILLECTOMY    . WISDOM TOOTH EXTRACTION      Prior to Admission medications   Medication Sig Start Date End Date Taking? Authorizing Provider  cholecalciferol (VITAMIN D) 1000 UNITS tablet Take 1,000 Units by mouth daily.    [provider]  clopidogrel (PLAVIX) 75 MG tablet TAKE ONE TABLET BY MOUTH EVERY DAY *BOTTLE* 09/30/17   Venia Carbon, MD  fluticasone (FLONASE) 50 MCG/ACT nasal spray Place 2 sprays into the nose daily as needed.     [provider]  gentamicin cream (GARAMYCIN) 0.1 % Apply 1 application topically 2 (two) times daily. 05/24/19   Edrick Kins, DPM  losartan (COZAAR) 100 MG tablet TAKE ONE TABLET BY MOUTH EACH MORNING FOR BLOOD PRESSURE CONTROL. *BOTTLE* *BOTTLE* 04/06/18   Venia Carbon, MD  metoprolol succinate (TOPROL-XL) 50 MG 24 hr tablet **VIAL ONLY** TAKE (1) TABLET BY MOUTH ONCE A DAY.DO NOT CRUSH. (HIGHBLOOD PRESSURE) *BOTTLE* 04/06/18   Venia Carbon, MD  Multiple Vitamin (MULTIVITAMIN) tablet Take 1 tablet by mouth daily.      [provider]  Naproxen Sodium 220 MG CAPS Take 220-440 mg by mouth 2 (two) times daily as needed.    [provider]  rosuvastatin (CRESTOR) 10 MG  tablet **VIAL ONLY** TAKE ONE TABLET BY MOUTH AT BEDTIME. (IMPROVES CHOLESTEROL) 09/30/17   Venia Carbon, MD  sildenafil (REVATIO) 20 MG tablet TAKE 3-5 TABLETS BY MOUTH DAILY AS NEEDED 08/24/17   Venia Carbon, MD    Allergies Cilostazol and Simvastatin  Family History  Problem Relation Age of Onset  . Diabetes Father        DM - and brother   . Stroke Father   . Coronary artery disease Paternal Grandfather   . Colon cancer Neg  Hx   . Prostate cancer Neg Hx     Social History Social History   Tobacco Use  . Smoking status: Former Smoker    Types: Cigarettes    Quit date: 10/20/1992    Years since quitting: 26.9  . Smokeless tobacco: Never Used  . Tobacco comment: quit in 1994   Substance Use Topics  . Alcohol use: Not Currently    Comment: rare  . Drug use: No    Review of Systems Constitutional: No fever/chills Eyes: No visual changes. ENT: No sore throat. Cardiovascular: Positive for chest pain. Respiratory: Denies shortness of breath. Gastrointestinal: No abdominal pain.  No nausea, no vomiting.  No diarrhea.   Genitourinary: Negative for dysuria. Musculoskeletal: Negative for back pain. Skin: Negative for rash. Neurological: Negative for headaches, focal weakness or numbness.  ____________________________________________   PHYSICAL EXAM:  VITAL SIGNS: ED Triage Vitals  Enc Vitals Group     BP 09/11/19 0136 (!) 144/86     Pulse Rate 09/11/19 0133 100     Resp 09/11/19 0133 14     Temp 09/11/19 0136 97.8 F (36.6 C)     Temp Source 09/11/19 0133 Oral     SpO2 09/11/19 0136 100 %     Weight 09/11/19 0133 200 lb (90.7 kg)     Height 09/11/19 0133 5\' 10"  (1.778 m)     Head Circumference --      Peak Flow --      Pain Score 09/11/19 0133 0    Constitutional: Alert and oriented.  Eyes: Conjunctivae are normal.  ENT      Head: Normocephalic and atraumatic.      Nose: No congestion/rhinnorhea.      Mouth/Throat: Mucous membranes are moist.      Neck: No stridor. Hematological/Lymphatic/Immunilogical: No cervical lymphadenopathy. Cardiovascular: Normal rate, regular rhythm.  No murmurs, rubs, or gallops.  Respiratory: Normal respiratory effort without tachypnea nor retractions. Breath sounds are clear and equal bilaterally. No wheezes/rales/rhonchi. Gastrointestinal: Soft and non tender. No rebound. No guarding.  Genitourinary: Deferred Musculoskeletal: Normal range of motion in all  extremities. No lower extremity edema. Neurologic:  Normal speech and language. No gross focal neurologic deficits are appreciated.  Skin:  Skin is warm, dry and intact. No rash noted. Psychiatric: Mood and affect are normal. Speech and behavior are normal. Patient exhibits appropriate insight and judgment.  ____________________________________________    LABS (pertinent positives/negatives)  Trop hs 17 -> 69 CBC wbc 8.3, hgb 9.6, plt 148 CMP wnl except glu 201, bun 28, cr 1.64, alb 3.4  ____________________________________________   EKG  I, Nance Pear, attending physician, personally viewed and interpreted this EKG  EKG Time: 0136 Rate: 89 Rhythm: sinus rhythm Axis: normal Intervals: qtc 426 QRS: narrow ST changes: no st elevation Impression: abnormal r wave progression  I, Nance Pear, attending physician, personally viewed and interpreted this EKG  EKG Time: 0511 Rate: 85 Rhythm: sinus rhythm Axis: normal Intervals: qtc 423 QRS:  narrow ST changes: no st elevation Impression: abnormal r wave progression    ____________________________________________    RADIOLOGY  CXR No acute abnormality  ____________________________________________   PROCEDURES  Procedures  CRITICAL CARE Performed by: Nance Pear   Total critical care time: 40 minutes  Critical care time was exclusive of separately billable procedures and treating other patients.  Critical care was necessary to treat or prevent imminent or life-threatening deterioration.  Critical care was time spent personally by me on the following activities: development of treatment plan with patient and/or surrogate as well as nursing, discussions with consultants, evaluation of patient's response to treatment, examination of patient, obtaining history from patient or surrogate, ordering and performing treatments and interventions, ordering and review of laboratory studies, ordering and review  of radiographic studies, pulse oximetry and re-evaluation of patient's condition.  ____________________________________________   INITIAL IMPRESSION / ASSESSMENT AND PLAN / ED COURSE  Pertinent labs & imaging results that were available during my care of the patient were reviewed by me and considered in my medical decision making (see chart for details).   Patient presented to the emergency department today because of concern for chest pain. Patient does have a history of CAD. Initial EKG without st elevation. Initial troponin negative. Given high risk second troponin was sent which did return positive. Patient had already taken aspirin at home. While here he did have another brief episode of chest pain. EKGs were repeated however still without any st elevation. Discussed finding of elevated troponin with patient. Given that he had another episode of pain I did order a heparin drip. Discussed admission with patient and discussed case with hospitalist.   ____________________________________________   FINAL CLINICAL IMPRESSION(S) / ED DIAGNOSES  Final diagnoses:  Chest pain, unspecified type     Note: This dictation was prepared with Dragon dictation. Any transcriptional errors that result from this process are unintentional     Nance Pear, MD 09/11/19 [phone]

## 2019-09-11 NOTE — ED Notes (Signed)
April, rn gave report to [place], rn.

## 2019-09-11 NOTE — Plan of Care (Signed)
Pt denies CP, SOB, N/V.  Receiving heparin IV.  Bleeding precautions reviewed with pt and son, [name].  Safety measures in place with bed low, brakes locked, call light and phone within reach.  Problem: Education: Goal: Knowledge of General Education information will improve Description: Including pain rating scale, medication(s)/side effects and non-pharmacologic comfort measures Outcome: Progressing   Problem: Health Behavior/Discharge Planning: Goal: Ability to manage health-related needs will improve Outcome: Progressing   Problem: Clinical Measurements: Goal: Ability to maintain clinical measurements within normal limits will improve Outcome: Progressing Goal: Will remain free from infection Outcome: Progressing Goal: Diagnostic test results will improve Outcome: Progressing Goal: Respiratory complications will improve Outcome: Progressing Goal: Cardiovascular complication will be avoided Outcome: Progressing   Problem: Activity: Goal: Risk for activity intolerance will decrease Outcome: Progressing   Problem: Nutrition: Goal: Adequate nutrition will be maintained Outcome: Progressing   Problem: Coping: Goal: Level of anxiety will decrease Outcome: Progressing   Problem: Elimination: Goal: Will not experience complications related to bowel motility Outcome: Progressing Goal: Will not experience complications related to urinary retention Outcome: Progressing   Problem: Pain Managment: Goal: General experience of comfort will improve Outcome: Progressing   Problem: Safety: Goal: Ability to remain free from injury will improve Outcome: Progressing   Problem: Skin Integrity: Goal: Risk for impaired skin integrity will decrease Outcome: Progressing

## 2019-09-11 NOTE — Consult Note (Signed)
[facility] Consultation Note  Patient ID: Brandon Lewis, MRN: [id number], DOB/AGE: Oct 27, 1933 85 y.o. Admit date: 09/11/2019   Date of Consult: 09/11/2019 Primary Physician: Venia Carbon, MD Primary Cardiologist: None  Chief Complaint:  Chief Complaint  Patient presents with  . Chest Pain   Reason for Consult: Unstable angina  HPI: 85 y.o. male with known coronary artery disease status post PCI and stent placement in the remote past and peripheral vascular disease with aortobifem bypass and bilateral superficial femoral artery occlusions in the past.  The patient also has hypertension hyperlipidemia on appropriate medication management.  The patient has had no evidence of significant cardiovascular symptoms in the recent past and has done most of the physical activity he wishes around the yard and or in the house.  He was awakened the other night with severe substernal chest pain and pressure with shortness of breath lasting approximately 20 to 40 minutes and the EMS had given him nitroglycerin for improvements.  EKG shows normal sinus rhythm and troponin peaked out at 69 with some mild amount of anemia and a glomerular filtration rate of 38.  Chest x-ray shows no evidence of congestive heart failure or pulmonary edema.  Patient has now had been on heparin and has had no bleeding issues and the patient additionally has had no further chest pain on current medical regimen.  There has been concerns of this being unstable angina and/or non-ST elevation myocardial infarction.  After long discussion with the family the patient wishes to pursue further direct assessment including cardiac catheterization.  We have discussed at length the risk and benefits of cardiac catheterization.  This includes the possibility of death stroke heart attack infection bleeding or blood clot.  He is at low risk for conscious sedation. Patient's family requested this consult  Past Medical History:   Diagnosis Date  . CAD (coronary artery disease)    s/p PCI after presenting with exertional angina (drug eluting stent)  . Diverticulosis   . GERD (gastroesophageal reflux disease)   . HTN (hypertension)   . Hyperlipidemia   . Osteoarthritis   . PVD (peripheral vascular disease) ([place])    s/p aortobifemoral bypass, with bilateral SFA occlusion and intermittent claudication   . Small bowel obstruction [facility])       Surgical History:  Past Surgical History:  Procedure Laterality Date  . aorto-bifem  2003   hayes   . CAROTID STENT     [facility]  . CATARACT EXTRACTION, BILATERAL  2017  . CHOLECYSTECTOMY  2002  . TONSILLECTOMY    . WISDOM TOOTH EXTRACTION       Home Meds: Prior to Admission medications   Medication Sig Start Date End Date Taking? Authorizing Provider  cholecalciferol (VITAMIN D) 1000 UNITS tablet Take 1,000 Units by mouth daily.    [provider]  clopidogrel (PLAVIX) 75 MG tablet TAKE ONE TABLET BY MOUTH EVERY DAY *BOTTLE* 09/30/17   Venia Carbon, MD  fluticasone (FLONASE) 50 MCG/ACT nasal spray Place 2 sprays into the nose daily as needed.     [provider]  gentamicin cream (GARAMYCIN) 0.1 % Apply 1 application topically 2 (two) times daily. 05/24/19   Edrick Kins, DPM  losartan (COZAAR) 100 MG tablet TAKE ONE TABLET BY MOUTH EACH MORNING FOR BLOOD PRESSURE CONTROL. *BOTTLE* *BOTTLE* 04/06/18   Venia Carbon, MD  metoprolol succinate (TOPROL-XL) 50 MG 24 hr tablet **VIAL ONLY** TAKE (1) TABLET BY MOUTH ONCE A DAY.DO NOT CRUSH. (HIGHBLOOD  PRESSURE) *BOTTLE* 04/06/18   Venia Carbon, MD  Multiple Vitamin (MULTIVITAMIN) tablet Take 1 tablet by mouth daily.      [provider]  Naproxen Sodium 220 MG CAPS Take 220-440 mg by mouth 2 (two) times daily as needed.    [provider]  rosuvastatin (CRESTOR) 10 MG tablet **VIAL ONLY** TAKE ONE TABLET BY MOUTH AT BEDTIME. (IMPROVES CHOLESTEROL) 09/30/17   Venia Carbon, MD   sildenafil (REVATIO) 20 MG tablet TAKE 3-5 TABLETS BY MOUTH DAILY AS NEEDED 08/24/17   Venia Carbon, MD    Inpatient Medications:  . alum & mag hydroxide-simeth  30 mL Oral Once   And  . lidocaine  15 mL Oral Once  . aspirin EC  325 mg Oral Daily  . cholecalciferol  1,000 Units Oral Daily  . clopidogrel  75 mg Oral Daily  . [START ON 09/12/2019] influenza vaccine adjuvanted  0.5 mL Intramuscular Tomorrow-1000  . metoprolol succinate  50 mg Oral Daily  . multivitamin with minerals  1 tablet Oral Daily  . rosuvastatin  10 mg Oral q1800   . sodium chloride 75 mL/hr at 09/11/19 1516  . heparin 1,150 Units/hr (09/11/19 1516)    Allergies:  Allergies  Allergen Reactions  . Cilostazol     REACTION: stomach problems- constipation  . Simvastatin     REACTION: myalgia    Social History   Socioeconomic History  . Marital status: Married    Spouse name: Not on file  . Number of children: 3  . Years of education: Not on file  . Highest education level: Not on file  Occupational History  . 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 Financial resource strain: Not on file  . Food insecurity    Worry: Not on file    Inability: Not on file  . Transportation needs    Medical: Not on file    Non-medical: Not on file  Tobacco Use  . Smoking status: Former Smoker    Types: Cigarettes    Quit date: 10/20/1992    Years since quitting: 26.9  . Smokeless tobacco: Never Used  . Tobacco comment: quit in 1994   Substance and Sexual Activity  . Alcohol use: Not Currently    Comment: rare  . Drug use: No  . Sexual activity: Not on file  Lifestyle  . Physical activity    Days per week: Not on file    Minutes per session: Not on file  . Stress: Not on file  Relationships  . Social Herbalist on phone: Not on file    Gets together: Not on file    Attends religious service: Not on file    Active member of club or organization: Not on file     Attends meetings of clubs or organizations: Not on file    Relationship status: Not on file  . Intimate partner violence    Fear of current or ex partner: Not on file    Emotionally abused: Not on file    Physically abused: Not on file    Forced sexual activity: Not on file  Other Topics Concern  . Not on file  Social History Narrative   Widowed; then remarried; 3 sons.      Has a living will - wife has health care POA    Son [name] would be alternate   Would want resuscitation attempts   Would accept brief trial  of artificial nutrition      Pt signed designated party release form and gives Shay Daiber [phone] (home #), access to medical records. Can also leave msg on home answering machine. Cell # [phone]     Family History  Problem Relation Age of Onset  . Diabetes Father        DM - and brother   . Stroke Father   . Coronary artery disease Paternal Grandfather   . Colon cancer Neg Hx   . Prostate cancer Neg Hx      Review of Systems Positive for chest pain Negative for: General:  chills, fever, night sweats or weight changes.  Cardiovascular: PND orthopnea syncope dizziness  Dermatological skin lesions rashes Respiratory: Cough congestion Urologic: Frequent urination urination at night and hematuria Abdominal: negative for nausea, vomiting, diarrhea, bright red blood per rectum, melena, or hematemesis Neurologic: negative for visual changes, and/or hearing changes  All other systems reviewed and are otherwise negative except as noted above.  Labs: No results for input(s): CKTOTAL, CKMB, TROPONINI in the last 72 hours. Lab Results  Component Value Date   WBC 8.3 09/11/2019   HGB 9.6 (L) 09/11/2019   HCT 30.4 (L) 09/11/2019   MCV 119.2 (H) 09/11/2019   PLT 148 (L) 09/11/2019    Recent Labs  Lab 09/11/19 0139  NA 141  K 4.2  CL 108  CO2 24  BUN 28*  CREATININE 1.64*  CALCIUM 9.5  PROT 7.2  BILITOT 0.6  ALKPHOS 67  ALT 15  AST 21  GLUCOSE 201*    Lab Results  Component Value Date   CHOL 134 09/11/2019   HDL 32 (L) 09/11/2019   LDLCALC 92 09/11/2019   TRIG 49 09/11/2019   No results found for: DDIMER  Radiology/Studies:  Dg Chest 2 View  Result Date: 09/11/2019 CLINICAL DATA:  Chest pain EXAM: CHEST - 2 VIEW COMPARISON:  None. FINDINGS: No consolidation or effusion. Scarring or atelectasis at the lingula. Normal heart size. No pneumothorax. Chronic appearing wedging deformities at the thoracolumbar junction. IMPRESSION: No active cardiopulmonary disease. Scarring or atelectasis at the lingula. Electronically Signed   By: Donavan Foil M.D.   On: 09/11/2019 02:23    EKG: Normal sinus rhythm  Weights: Filed Weights   09/11/19 0133 09/11/19 0900  Weight: 90.7 kg 90.8 kg     Physical Exam: Blood pressure (!) 151/82, pulse 72, temperature 97.9 F (36.6 C), temperature source Oral, resp. rate 18, height 5\' 10"  (1.778 m), weight 90.8 kg, SpO2 100 %. Body mass index is 28.72 kg/m. General: Well developed, well nourished, in no acute distress. Head eyes ears nose throat: Normocephalic, atraumatic, sclera non-icteric, no xanthomas, nares are without discharge. No apparent thyromegaly and/or mass  Lungs: Normal respiratory effort.  no wheezes, no rales, no rhonchi.  Heart: RRR with normal S1 S2. no murmur gallop, no rub, PMI is normal size and placement, carotid upstroke normal without bruit, jugular venous pressure is normal Abdomen: Soft, non-tender, non-distended with normoactive bowel sounds. No hepatomegaly. No rebound/guarding. No obvious abdominal masses. Abdominal aorta is normal size without bruit Extremities: No edema. no cyanosis, no clubbing, no ulcers  Peripheral : 2+ bilateral upper extremity pulses, 2+ bilateral femoral pulses, 2+ bilateral dorsal pedal pulse Neuro: Alert and oriented. No facial asymmetry. No focal deficit. Moves all extremities spontaneously. Musculoskeletal: Normal muscle tone without  kyphosis Psych:  Responds to questions appropriately with a normal affect.    Assessment: 85 year old male with peripheral vascular disease  coronary atherosclerosis status post previous PCI and stent placement with chest discomfort and possible non-ST elevation myocardial infarction  Plan: 1.  Continue heparin for further risk reduction in myocardial infarction 2.  Metoprolol for coronary artery disease 3.  High intensity cholesterol therapy 4.  Proceed to cardiac catheterization to assess coronary anatomy and further treatment thereof is necessary.  Patient understands the risk and benefits of cardiac catheterization.  This includes the possibility of death stroke heart attack infection bleeding or blood clot.  He is at low risk for conscious sedation  Signed, Corey Skains M.D. [facility] 09/11/2019, 6:10 PM

## 2019-09-11 NOTE — Progress Notes (Signed)
ANTICOAGULATION CONSULT NOTE - Initial Consult  Pharmacy Consult for heparin Indication: chest pain/ACS  Allergies  Allergen Reactions  . Cilostazol     REACTION: stomach problems- constipation  . Simvastatin     REACTION: myalgia    Patient Measurements: Height: 5\' 10"  (177.8 cm) Weight: 200 lb 2.8 oz (90.8 kg) IBW/kg (Calculated) : 73 Heparin Dosing Weight: 81 kg  Vital Signs: Temp: 98.4 F (36.9 C) (11/22 1931) Temp Source: Oral (11/22 1931) BP: 137/71 (11/22 1931) Pulse Rate: 73 (11/22 1931)  Labs: Recent Labs    09/11/19 0139 09/11/19 0334 09/11/19 0559 09/11/19 0634 09/11/19 0806 09/11/19 1348 09/11/19 2254  HGB 9.6*  --   --   --   --   --   --   HCT 30.4*  --   --   --   --   --   --   PLT 148*  --   --   --   --   --   --   APTT  --   --  29  --   --   --   --   LABPROT  --   --  13.5  --   --   --   --   INR  --   --  1.0  --   --   --   --   HEPARINUNFRC  --   --   --   --   --  0.24* 0.40  CREATININE 1.64*  --   --   --   --   --   --   TROPONINIHS 17 69*  --  81* 74*  --   --     Estimated Creatinine Clearance: 38.7 mL/min (A) (by C-G formula based on SCr of 1.64 mg/dL (H)).   Medical History: Past Medical History:  Diagnosis Date  . CAD (coronary artery disease)    s/p PCI after presenting with exertional angina (drug eluting stent)  . Diverticulosis   . GERD (gastroesophageal reflux disease)   . HTN (hypertension)   . Hyperlipidemia   . Osteoarthritis   . PVD (peripheral vascular disease) ([place])    s/p aortobifemoral bypass, with bilateral SFA occlusion and intermittent claudication   . Small bowel obstruction ([facility])     Medications:  Scheduled:  . alum & mag hydroxide-simeth  30 mL Oral Once   And  . lidocaine  15 mL Oral Once  . aspirin EC  325 mg Oral Daily  . cholecalciferol  1,000 Units Oral Daily  . clopidogrel  75 mg Oral Daily  . [START ON 09/12/2019] influenza vaccine adjuvanted  0.5 mL Intramuscular Tomorrow-1000  .  metoprolol succinate  50 mg Oral Daily  . multivitamin with minerals  1 tablet Oral Daily  . rosuvastatin  10 mg Oral q1800  . sodium chloride flush  3 mL Intravenous Q12H    Assessment: Patient arrives to ED w/ c/o LS CP non-radiating w/ h/o CAD x 2 stents and PVD. Patient's initial trop was 88 rose to 69. EKG showing some ST depression w/ T wave abnormalities. Patient does not appear to be on anticoagulants PTA except for plavix  Goal of Therapy:   Heparin level 0.3-0.7 units/ml Monitor platelets by anticoagulation protocol: Yes   Plan:  11/22 @ 2254  HL 0.40.  Will continue current rate and will recheck HL at 0700.  Will monitor daily CBC's and adjust per anti-Xa levels.  Tobie Lords, [facility] Clinical Pharmacist 09/11/2019,11:32  PM

## 2019-09-12 ENCOUNTER — Inpatient Hospital Stay (HOSPITAL_COMMUNITY)
Admission: AD | Admit: 2019-09-12 | Discharge: 2019-10-02 | DRG: 236 | Disposition: A | Discharge status: Home Health | Payer: Medicare Other | Source: Other Acute Inpatient Hospital | Attending: Cardiothoracic Surgery | Admitting: Cardiothoracic Surgery

## 2019-09-12 ENCOUNTER — Encounter
Admission: EM | Disposition: A | Discharge status: Other Acute Inpatient Hospital | Payer: Self-pay | Source: Home / Self Care | Attending: Internal Medicine

## 2019-09-12 ENCOUNTER — Encounter: Payer: Self-pay | Admitting: *Deleted

## 2019-09-12 DIAGNOSIS — Z951 Presence of aortocoronary bypass graft: Secondary | ICD-10-CM

## 2019-09-12 DIAGNOSIS — Z7902 Long term (current) use of antithrombotics/antiplatelets: Secondary | ICD-10-CM | POA: Diagnosis not present

## 2019-09-12 DIAGNOSIS — E7800 Pure hypercholesterolemia, unspecified: Secondary | ICD-10-CM | POA: Diagnosis not present

## 2019-09-12 DIAGNOSIS — I2583 Coronary atherosclerosis due to lipid rich plaque: Secondary | ICD-10-CM | POA: Diagnosis not present

## 2019-09-12 DIAGNOSIS — I129 Hypertensive chronic kidney disease with stage 1 through stage 4 chronic kidney disease, or unspecified chronic kidney disease: Secondary | ICD-10-CM | POA: Diagnosis present

## 2019-09-12 DIAGNOSIS — Z20828 Contact with and (suspected) exposure to other viral communicable diseases: Secondary | ICD-10-CM | POA: Diagnosis present

## 2019-09-12 DIAGNOSIS — I10 Essential (primary) hypertension: Secondary | ICD-10-CM | POA: Diagnosis not present

## 2019-09-12 DIAGNOSIS — D638 Anemia in other chronic diseases classified elsewhere: Secondary | ICD-10-CM | POA: Diagnosis present

## 2019-09-12 DIAGNOSIS — K567 Ileus, unspecified: Secondary | ICD-10-CM | POA: Diagnosis not present

## 2019-09-12 DIAGNOSIS — N179 Acute kidney failure, unspecified: Secondary | ICD-10-CM

## 2019-09-12 DIAGNOSIS — E785 Hyperlipidemia, unspecified: Secondary | ICD-10-CM | POA: Diagnosis present

## 2019-09-12 DIAGNOSIS — I2510 Atherosclerotic heart disease of native coronary artery without angina pectoris: Secondary | ICD-10-CM

## 2019-09-12 DIAGNOSIS — I255 Ischemic cardiomyopathy: Secondary | ICD-10-CM | POA: Diagnosis not present

## 2019-09-12 DIAGNOSIS — Z955 Presence of coronary angioplasty implant and graft: Secondary | ICD-10-CM | POA: Diagnosis not present

## 2019-09-12 DIAGNOSIS — D696 Thrombocytopenia, unspecified: Secondary | ICD-10-CM | POA: Diagnosis present

## 2019-09-12 DIAGNOSIS — D72829 Elevated white blood cell count, unspecified: Secondary | ICD-10-CM | POA: Diagnosis present

## 2019-09-12 DIAGNOSIS — R079 Chest pain, unspecified: Secondary | ICD-10-CM | POA: Diagnosis present

## 2019-09-12 DIAGNOSIS — E1122 Type 2 diabetes mellitus with diabetic chronic kidney disease: Secondary | ICD-10-CM | POA: Diagnosis present

## 2019-09-12 DIAGNOSIS — H9190 Unspecified hearing loss, unspecified ear: Secondary | ICD-10-CM | POA: Diagnosis present

## 2019-09-12 DIAGNOSIS — E1151 Type 2 diabetes mellitus with diabetic peripheral angiopathy without gangrene: Secondary | ICD-10-CM | POA: Diagnosis present

## 2019-09-12 DIAGNOSIS — R609 Edema, unspecified: Secondary | ICD-10-CM | POA: Diagnosis not present

## 2019-09-12 DIAGNOSIS — Z9049 Acquired absence of other specified parts of digestive tract: Secondary | ICD-10-CM

## 2019-09-12 DIAGNOSIS — D62 Acute posthemorrhagic anemia: Secondary | ICD-10-CM | POA: Diagnosis not present

## 2019-09-12 DIAGNOSIS — K219 Gastro-esophageal reflux disease without esophagitis: Secondary | ICD-10-CM | POA: Diagnosis present

## 2019-09-12 DIAGNOSIS — I214 Non-ST elevation (NSTEMI) myocardial infarction: Secondary | ICD-10-CM

## 2019-09-12 DIAGNOSIS — I4892 Unspecified atrial flutter: Secondary | ICD-10-CM | POA: Diagnosis not present

## 2019-09-12 DIAGNOSIS — Z87891 Personal history of nicotine dependence: Secondary | ICD-10-CM | POA: Diagnosis not present

## 2019-09-12 DIAGNOSIS — J90 Pleural effusion, not elsewhere classified: Secondary | ICD-10-CM

## 2019-09-12 DIAGNOSIS — Z0189 Encounter for other specified special examinations: Secondary | ICD-10-CM

## 2019-09-12 DIAGNOSIS — I493 Ventricular premature depolarization: Secondary | ICD-10-CM | POA: Diagnosis not present

## 2019-09-12 DIAGNOSIS — I25110 Atherosclerotic heart disease of native coronary artery with unstable angina pectoris: Secondary | ICD-10-CM | POA: Diagnosis present

## 2019-09-12 DIAGNOSIS — Z23 Encounter for immunization: Secondary | ICD-10-CM

## 2019-09-12 DIAGNOSIS — R062 Wheezing: Secondary | ICD-10-CM | POA: Diagnosis not present

## 2019-09-12 DIAGNOSIS — I471 Supraventricular tachycardia: Secondary | ICD-10-CM | POA: Diagnosis not present

## 2019-09-12 DIAGNOSIS — I200 Unstable angina: Secondary | ICD-10-CM

## 2019-09-12 DIAGNOSIS — Z833 Family history of diabetes mellitus: Secondary | ICD-10-CM

## 2019-09-12 DIAGNOSIS — J309 Allergic rhinitis, unspecified: Secondary | ICD-10-CM

## 2019-09-12 DIAGNOSIS — Z8249 Family history of ischemic heart disease and other diseases of the circulatory system: Secondary | ICD-10-CM | POA: Diagnosis not present

## 2019-09-12 DIAGNOSIS — Z9889 Other specified postprocedural states: Secondary | ICD-10-CM

## 2019-09-12 DIAGNOSIS — I739 Peripheral vascular disease, unspecified: Secondary | ICD-10-CM | POA: Diagnosis not present

## 2019-09-12 DIAGNOSIS — N183 Chronic kidney disease, stage 3 unspecified: Secondary | ICD-10-CM | POA: Diagnosis present

## 2019-09-12 DIAGNOSIS — R0602 Shortness of breath: Secondary | ICD-10-CM

## 2019-09-12 DIAGNOSIS — Z01810 Encounter for preprocedural cardiovascular examination: Secondary | ICD-10-CM | POA: Diagnosis not present

## 2019-09-12 DIAGNOSIS — Z79899 Other long term (current) drug therapy: Secondary | ICD-10-CM | POA: Diagnosis not present

## 2019-09-12 DIAGNOSIS — E8770 Fluid overload, unspecified: Secondary | ICD-10-CM | POA: Diagnosis not present

## 2019-09-12 HISTORY — DX: Atherosclerotic heart disease of native coronary artery without angina pectoris: I25.10

## 2019-09-12 HISTORY — PX: LEFT HEART CATH AND CORONARY ANGIOGRAPHY: CATH118249

## 2019-09-12 LAB — BASIC METABOLIC PANEL
Anion gap: 6 (ref 5–15)
BUN: 24 mg/dL — ABNORMAL HIGH (ref 8–23)
CO2: 21 mmol/L — ABNORMAL LOW (ref 22–32)
Calcium: 9.6 mg/dL (ref 8.9–10.3)
Chloride: 111 mmol/L (ref 98–111)
Creatinine, Ser: 1.4 mg/dL — ABNORMAL HIGH (ref 0.61–1.24)
GFR calc Af Amer: 53 mL/min — ABNORMAL LOW (ref >60)
GFR calc non Af Amer: 46 mL/min — ABNORMAL LOW (ref >60)
Glucose, Bld: 162 mg/dL — ABNORMAL HIGH (ref 70–99)
Potassium: 5.3 mmol/L — ABNORMAL HIGH (ref 3.5–5.1)
Sodium: 138 mmol/L (ref 135–145)

## 2019-09-12 SURGERY — LEFT HEART CATH AND CORONARY ANGIOGRAPHY
Anesthesia: Moderate Sedation

## 2019-09-12 MED ORDER — SODIUM CHLORIDE 0.9 % IV SOLN: 250.0000 mL | INTRAVENOUS | Status: DC | PRN

## 2019-09-12 MED ORDER — ASPIRIN 81 MG PO CHEW
81.0000 mg | CHEWABLE_TABLET | ORAL | Status: AC
  Administered 2019-09-12: 81 mg via ORAL

## 2019-09-12 MED ORDER — ROSUVASTATIN CALCIUM 10 MG PO TABS: 20.0000 mg | ORAL_TABLET | Freq: Every day | ORAL | Status: DC

## 2019-09-12 MED ORDER — HYDRALAZINE HCL 20 MG/ML IJ SOLN
INTRAMUSCULAR | Status: AC
  Filled 2019-09-12: qty 1

## 2019-09-12 MED ORDER — HEPARIN (PORCINE) IN NACL 1000-0.9 UT/500ML-% IV SOLN
INTRAVENOUS | Status: AC
  Filled 2019-09-12: qty 1000

## 2019-09-12 MED ORDER — SODIUM CHLORIDE 0.9 % WEIGHT BASED INFUSION
3.0000 mL/kg/h | INTRAVENOUS | Status: DC
  Administered 2019-09-12: 3 mL/kg/h via INTRAVENOUS

## 2019-09-12 MED ORDER — ONDANSETRON HCL 4 MG/2ML IJ SOLN: 4.0000 mg | Freq: Four times a day (QID) | INTRAMUSCULAR | Status: DC | PRN

## 2019-09-12 MED ORDER — NITROGLYCERIN 0.4 MG SL SUBL: 0.4000 mg | SUBLINGUAL_TABLET | SUBLINGUAL | Status: DC | PRN

## 2019-09-12 MED ORDER — HEPARIN BOLUS VIA INFUSION
4000.0000 [IU] | Freq: Once | INTRAVENOUS | Status: AC
  Administered 2019-09-12: 4000 [IU] via INTRAVENOUS
  Filled 2019-09-12: qty 4000

## 2019-09-12 MED ORDER — LOSARTAN POTASSIUM 50 MG PO TABS: 100.0000 mg | ORAL_TABLET | Freq: Every day | ORAL | Status: DC

## 2019-09-12 MED ORDER — LABETALOL HCL 5 MG/ML IV SOLN: 10.0000 mg | INTRAVENOUS | Status: AC | PRN

## 2019-09-12 MED ORDER — FENTANYL CITRATE (PF) 100 MCG/2ML IJ SOLN
INTRAMUSCULAR | Status: AC
  Filled 2019-09-12: qty 2

## 2019-09-12 MED ORDER — AMLODIPINE BESYLATE 5 MG PO TABS
5.0000 mg | ORAL_TABLET | Freq: Every day | ORAL | Status: DC
  Administered 2019-09-12 – 2019-09-15 (×4): 5 mg via ORAL
  Filled 2019-09-12 (×4): qty 1

## 2019-09-12 MED ORDER — ACETAMINOPHEN 325 MG PO TABS: 650.0000 mg | ORAL_TABLET | ORAL | Status: DC | PRN

## 2019-09-12 MED ORDER — FENTANYL CITRATE (PF) 100 MCG/2ML IJ SOLN
INTRAMUSCULAR | Status: DC | PRN
  Administered 2019-09-12: 50 ug via INTRAVENOUS

## 2019-09-12 MED ORDER — HEPARIN SODIUM (PORCINE) 1000 UNIT/ML IJ SOLN
INTRAMUSCULAR | Status: AC
  Filled 2019-09-12: qty 1

## 2019-09-12 MED ORDER — SODIUM CHLORIDE 0.9 % WEIGHT BASED INFUSION: 1.0000 mL/kg/h | INTRAVENOUS | Status: DC

## 2019-09-12 MED ORDER — HEPARIN (PORCINE) IN NACL 2000-0.9 UNIT/L-% IV SOLN
INTRAVENOUS | Status: DC | PRN
  Administered 2019-09-12: 500 mL

## 2019-09-12 MED ORDER — MIDAZOLAM HCL 2 MG/2ML IJ SOLN
INTRAMUSCULAR | Status: AC
  Filled 2019-09-12: qty 2

## 2019-09-12 MED ORDER — HEPARIN SODIUM (PORCINE) 5000 UNIT/ML IJ SOLN: 5000.0000 [IU] | Freq: Three times a day (TID) | INTRAMUSCULAR | Status: DC

## 2019-09-12 MED ORDER — ASPIRIN EC 81 MG PO TBEC
81.0000 mg | DELAYED_RELEASE_TABLET | Freq: Every day | ORAL | Status: DC
  Administered 2019-09-13 – 2019-09-15 (×3): 81 mg via ORAL
  Filled 2019-09-12 (×3): qty 1

## 2019-09-12 MED ORDER — FLUTICASONE PROPIONATE 50 MCG/ACT NA SUSP
2.0000 | Freq: Every day | NASAL | Status: DC
  Filled 2019-09-12: qty 16

## 2019-09-12 MED ORDER — VERAPAMIL HCL 2.5 MG/ML IV SOLN
INTRAVENOUS | Status: DC | PRN
  Administered 2019-09-12: 2.5 mg via INTRAVENOUS

## 2019-09-12 MED ORDER — SODIUM CHLORIDE 0.9% FLUSH: 3.0000 mL | INTRAVENOUS | Status: DC | PRN

## 2019-09-12 MED ORDER — ROSUVASTATIN CALCIUM 20 MG PO TABS: 20.0000 mg | ORAL_TABLET | Freq: Every day | ORAL | Status: DC

## 2019-09-12 MED ORDER — HYDRALAZINE HCL 20 MG/ML IJ SOLN
10.0000 mg | INTRAMUSCULAR | Status: AC | PRN
  Administered 2019-09-12: 10 mg via INTRAVENOUS

## 2019-09-12 MED ORDER — METOPROLOL SUCCINATE ER 50 MG PO TB24
50.0000 mg | ORAL_TABLET | Freq: Every day | ORAL | Status: DC
  Administered 2019-09-12 – 2019-09-15 (×4): 50 mg via ORAL
  Filled 2019-09-12 (×4): qty 1

## 2019-09-12 MED ORDER — HEPARIN SODIUM (PORCINE) 1000 UNIT/ML IJ SOLN
INTRAMUSCULAR | Status: DC | PRN
  Administered 2019-09-12: 4500 [IU] via INTRAVENOUS

## 2019-09-12 MED ORDER — MIDAZOLAM HCL 2 MG/2ML IJ SOLN
INTRAMUSCULAR | Status: DC | PRN
  Administered 2019-09-12: 1 mg via INTRAVENOUS

## 2019-09-12 MED ORDER — IOHEXOL 300 MG/ML  SOLN
INTRAMUSCULAR | Status: DC | PRN
  Administered 2019-09-12: 115 mL via INTRA_ARTERIAL

## 2019-09-12 MED ORDER — ASPIRIN 81 MG PO CHEW
CHEWABLE_TABLET | ORAL | Status: AC
  Filled 2019-09-12: qty 1

## 2019-09-12 MED ORDER — HEPARIN (PORCINE) 25000 UT/250ML-% IV SOLN
1350.0000 [IU]/h | INTRAVENOUS | Status: DC
  Administered 2019-09-12: 1150 [IU]/h via INTRAVENOUS
  Administered 2019-09-13: 1250 [IU]/h via INTRAVENOUS
  Administered 2019-09-14 – 2019-09-15 (×3): 1350 [IU]/h via INTRAVENOUS
  Filled 2019-09-12 (×5): qty 250

## 2019-09-12 MED ORDER — ROSUVASTATIN CALCIUM 20 MG PO TABS
20.0000 mg | ORAL_TABLET | Freq: Every day | ORAL | Status: DC
  Administered 2019-09-12 – 2019-09-19 (×8): 20 mg via ORAL
  Filled 2019-09-12 (×8): qty 1

## 2019-09-12 MED ORDER — VERAPAMIL HCL 2.5 MG/ML IV SOLN
INTRAVENOUS | Status: AC
  Filled 2019-09-12: qty 2

## 2019-09-12 SURGICAL SUPPLY — 8 items
CATH INFINITI 5 FR JL3.5 (CATHETERS) ×2 IMPLANT
CATH INFINITI 5FR ANG PIGTAIL (CATHETERS) IMPLANT
CATH INFINITI JR4 5F (CATHETERS) ×2 IMPLANT
DEVICE RAD TR BAND REGULAR (VASCULAR PRODUCTS) ×2 IMPLANT
GLIDESHEATH SLEND SS 6F .021 (SHEATH) ×2 IMPLANT
KIT MANI 3VAL PERCEP (MISCELLANEOUS) ×3 IMPLANT
PACK CARDIAC CATH (CUSTOM PROCEDURE TRAY) ×3 IMPLANT
WIRE ROSEN-J .035X260CM (WIRE) ×2 IMPLANT

## 2019-09-12 NOTE — Progress Notes (Signed)
PROGRESS NOTE    Brandon Lewis  [id number] DOB: [birth year] DOA: 09/11/2019  PCP: Venia Carbon, MD    LOS - 1   Brief Narrative:  85 y.o. male with a history of coronary artery disease status post PCI and stent, hypertension, dyslipidemia and peripheral vascular disease, presented to the ED with acute onset of midsternal chest pain, described as pressure, burning, and graded 7/10 in severity with associated diaphoresis. Had recurrence of pain while he was in the ED.  In the ED, vitals normal aside from mild hypertension.  High-sensitivity troponin initally 17, repeat 69.  ECG showed normal sinus rhythm, HR 64 with anterolateral T wave inversions.  Started on heparin and admitted to telemetry for cardiology evaluation and further management.  Patient taken to cath lab on 11/22, found to have multi-vessel coronary disease, no PCI was performed.  Likely to proceed with CABG.  Plavix on hold.  Subjective 11/23: Patient seen awake in bed, son at bedside, after heart cath this morning.  Patient denies any recurrence of chest pain, SOB or diaphoresis.  No acute events reported overnight.    Assessment & Plan:   Principal Problem:   NSTEMI (non-ST elevated myocardial infarction) ([place]) Active Problems:   Essential hypertension, benign   Hyperlipemia   Allergic rhinitis   NSTEMI - as evidenced by troponin elevation with T wave inversions in setting of chest pain. - cardiology following - heart cath 11/23 showed multi-vessel disease - consideration for CABG - hold Plavix - stop heparin gtt - continue ASA, beta blocker, high intensity statin, ARB  Hypertension - chronic, stable. - continue home Toprol and Cozaar  Hyperlipidemia - chronic, stable. - continue home Crestor, increased to 20 mg for high intensity coverage  Allergic Rhinitis - cont home Flonase  DVT prophylaxis: heparin   Code Status: Full Code  Family Communication: son at bedside today Disposition Plan:   Pending decision on proceeding with CABG   Consultants:   Cardiologu  Procedures:   Left heart cath - 11/23  Antimicrobials:   None    Objective: Vitals:   09/12/19 1015 09/12/19 1030 09/12/19 1045 09/12/19 1050  BP:  (!) 142/64    Pulse: 84 74 75 77  Resp: 16 14 11 17   Temp:      TempSrc:      SpO2: 98% 98% 99% 99%  Weight:      Height:        Intake/Output Summary (Last 24 hours) at 09/12/2019 1443 Last data filed at 09/12/2019 1000 Gross per 24 hour  Intake 2218.56 ml  Output 2800 ml  Net -581.44 ml   Filed Weights   09/11/19 0900 09/12/19 0436 09/12/19 0710  Weight: 90.8 kg 89.5 kg 89.5 kg    Examination:  General exam: awake, alert, no acute distress HEENT: clear conjunctiva, anicteric sclera, moist mucus membranes, hearing impairment  Respiratory system: clear to auscultation bilaterally, no wheezes, rales or rhonchi, normal respiratory effort. Cardiovascular system: normal S1/S2, RRR, no JVD, murmurs, rubs, gallops, no pedal edema.   Gastrointestinal system: soft, non-tender, non-distended abdomen, normal bowel sounds. Central nervous system: alert and oriented x4. no gross focal neurologic deficits, normal speech Extremities: moves all, no edema, normal tone Psychiatry: normal mood, congruent affect, judgement and insight appear normal    Data Reviewed: I have personally reviewed following labs and imaging studies  CBC: Recent Labs  Lab 09/11/19 0139  WBC 8.3  HGB 9.6*  HCT 30.4*  MCV 119.2*  PLT 148*  Basic Metabolic Panel: Recent Labs  Lab 09/11/19 0139  NA 141  K 4.2  CL 108  CO2 24  GLUCOSE 201*  BUN 28*  CREATININE 1.64*  CALCIUM 9.5   GFR: Estimated Creatinine Clearance: 38.4 mL/min (A) (by C-G formula based on SCr of 1.64 mg/dL (H)). Liver Function Tests: Recent Labs  Lab 09/11/19 0139  AST 21  ALT 15  ALKPHOS 67  BILITOT 0.6  PROT 7.2  ALBUMIN 3.4*   No results for input(s): LIPASE, AMYLASE in the last 168  hours. No results for input(s): AMMONIA in the last 168 hours. Coagulation Profile: Recent Labs  Lab 09/11/19 0559  INR 1.0   Cardiac Enzymes: No results for input(s): CKTOTAL, CKMB, CKMBINDEX, TROPONINI in the last 168 hours. BNP (last 3 results) No results for input(s): PROBNP in the last 8760 hours. HbA1C: No results for input(s): HGBA1C in the last 72 hours. CBG: No results for input(s): GLUCAP in the last 168 hours. Lipid Profile: Recent Labs    09/11/19 0634  CHOL 134  HDL 32*  LDLCALC 92  TRIG 49  CHOLHDL 4.2   Thyroid Function Tests: No results for input(s): TSH, T4TOTAL, FREET4, T3FREE, THYROIDAB in the last 72 hours. Anemia Panel: No results for input(s): VITAMINB12, FOLATE, FERRITIN, TIBC, IRON, RETICCTPCT in the last 72 hours. Sepsis Labs: No results for input(s): PROCALCITON, LATICACIDVEN in the last 168 hours.  Recent Results (from the past 240 hour(s))  SARS CORONAVIRUS 2 (TAT 6-24 HRS) Nasopharyngeal Nasopharyngeal Swab     Status: None   Collection Time: 09/11/19  5:14 AM   Specimen: Nasopharyngeal Swab  Result Value Ref Range Status   SARS Coronavirus 2 NEGATIVE NEGATIVE Final    Comment: (NOTE) SARS-CoV-2 target nucleic acids are NOT DETECTED. The SARS-CoV-2 RNA is generally detectable in upper and lower respiratory specimens during the acute phase of infection. Negative results do not preclude SARS-CoV-2 infection, do not rule out co-infections with other pathogens, and should not be used as the sole basis for treatment or other patient management decisions. Negative results must be combined with clinical observations, patient history, and epidemiological information. The expected result is Negative. Fact Sheet for Patients: SugarRoll.be Fact Sheet for Healthcare Providers: https://www.woods-mathews.com/ This test is not yet approved or cleared by the [place] FDA and  has been authorized for  detection and/or diagnosis of SARS-CoV-2 by FDA under an Emergency Use Authorization (EUA). This EUA will remain  in effect (meaning this test can be used) for the duration of the COVID-19 declaration under Section 56 4(b)(1) of the Act, 21 U.S.C. section 360bbb-3(b)(1), unless the authorization is terminated or revoked sooner. Performed at [facility], [place] [address] [place], [place] [address]          Radiology Studies: Dg Chest 2 View  Result Date: 09/11/2019 CLINICAL DATA:  Chest pain EXAM: CHEST - 2 VIEW COMPARISON:  None. FINDINGS: No consolidation or effusion. Scarring or atelectasis at the lingula. Normal heart size. No pneumothorax. Chronic appearing wedging deformities at the thoracolumbar junction. IMPRESSION: No active cardiopulmonary disease. Scarring or atelectasis at the lingula. Electronically Signed   By: Donavan Foil M.D.   On: 09/11/2019 02:23        Scheduled Meds: . alum & mag hydroxide-simeth  30 mL Oral Once   And  . lidocaine  15 mL Oral Once  . aspirin      . aspirin EC  325 mg Oral Daily  . cholecalciferol  1,000 Units Oral  Daily  . fluticasone  2 spray Each Nare Daily  . hydrALAZINE      . influenza vaccine adjuvanted  0.5 mL Intramuscular Tomorrow-1000  . [START ON 09/13/2019] losartan  100 mg Oral Daily  . metoprolol succinate  50 mg Oral Daily  . multivitamin with minerals  1 tablet Oral Daily  . rosuvastatin  20 mg Oral q1800  . sodium chloride flush  3 mL Intravenous Q12H   Continuous Infusions: . sodium chloride 75 mL/hr at 09/12/19 1147  . sodium chloride       LOS: 1 day    Time spent: 35-40 minutes    Ezekiel Slocumb, DO [facility] Pager: [phone]  If 7PM-7AM, please contact night-coverage [email or web address] Password [facility] 09/12/2019, 2:43 PM

## 2019-09-12 NOTE — H&P (Signed)
Cardiology Admission History and Physical:   Patient ID: Brandon Lewis MRN: [id number]; DOB: Jan 27, 1934   Admission date: (Not on file)  Primary Care Provider: Venia Carbon, MD Primary Cardiologist: Corey Skains, MD  Primary Electrophysiologist:  None   Chief Complaint:  Multivessel CAD  Patient Profile:   Brandon Lewis is a 85 y.o. male with PMH of CAD s/p remote PCI/DES, PVD s/p aortobifemoral bypass and bilateral superficial femoral artery occlusion, HTN, and HLD, who presented to [facility] with unstable angina where he was found to have severe multivessel CAD and transferred to [facility] for CABG evaluation.   History of Present Illness:   [name] was in his usual state of health until the early morning hours of 09/11/2019 when he was awoken from sleep with substernal chest pain and SOB. EKG was non-ischemic. HsTroponin peaked at 81. He was seen in consultation by Dr. [name] and recommended to undergo a LHC. Heart cath revealed severe multivessel CAD. He was recommended for transfer to [facility] for CABG evaluation. His plavix was held for wash out.   At the time of this evaluation he is chest pain free. He described the initial episode as a burning sensation that woke him from sleep, followed by an intense pressure reminiscent of previous angina. He took 2 SL nitro prior to EMS arrival with improvement in symptoms and has remained chest pain free throughout his admission. He denies any hematuria, hematochezia, melena, or hematemesis.   Heart Pathway Score:     Past Medical History:  Diagnosis Date  . CAD (coronary artery disease)    s/p PCI after presenting with exertional angina (drug eluting stent)  . Diverticulosis   . GERD (gastroesophageal reflux disease)   . HTN (hypertension)   . Hyperlipidemia   . Osteoarthritis   . PVD (peripheral vascular disease) ([place])    s/p aortobifemoral bypass, with bilateral SFA occlusion and intermittent claudication   . Small bowel  obstruction [facility])     Past Surgical History:  Procedure Laterality Date  . aorto-bifem  2003   [facility]   . CAROTID STENT     [place]  . CATARACT EXTRACTION, BILATERAL  2017  . CHOLECYSTECTOMY  2002  . LEFT HEART CATH AND CORONARY ANGIOGRAPHY N/A 09/12/2019   Procedure: LEFT HEART CATH AND CORONARY ANGIOGRAPHY;  Surgeon: Corey Skains, MD;  Location: [facility];  Service: Cardiovascular;  Laterality: N/A;  . TONSILLECTOMY    . WISDOM TOOTH EXTRACTION       Medications Prior to Admission: Prior to Admission medications   Medication Sig Start Date End Date Taking? Authorizing Provider  cholecalciferol (VITAMIN D) 1000 UNITS tablet Take 1,000 Units by mouth daily.    [provider]  clopidogrel (PLAVIX) 75 MG tablet TAKE ONE TABLET BY MOUTH EVERY DAY *BOTTLE* 09/30/17   Venia Carbon, MD  fluticasone (FLONASE) 50 MCG/ACT nasal spray Place 2 sprays into the nose daily as needed.     [provider]  gentamicin cream (GARAMYCIN) 0.1 % Apply 1 application topically 2 (two) times daily. 05/24/19   Edrick Kins, DPM  losartan (COZAAR) 100 MG tablet TAKE ONE TABLET BY MOUTH EACH MORNING FOR BLOOD PRESSURE CONTROL. *BOTTLE* *BOTTLE* 04/06/18   Venia Carbon, MD  metoprolol succinate (TOPROL-XL) 50 MG 24 hr tablet **VIAL ONLY** TAKE (1) TABLET BY MOUTH ONCE A DAY.DO NOT CRUSH. (HIGHBLOOD PRESSURE) *BOTTLE* 04/06/18   Venia Carbon, MD  Multiple Vitamin (MULTIVITAMIN) tablet Take 1 tablet by  mouth daily.      [provider]  Naproxen Sodium 220 MG CAPS Take 220-440 mg by mouth 2 (two) times daily as needed.    [provider]  rosuvastatin (CRESTOR) 10 MG tablet **VIAL ONLY** TAKE ONE TABLET BY MOUTH AT BEDTIME. (IMPROVES CHOLESTEROL) 09/30/17   Viviana Simpler I, MD  sildenafil (REVATIO) 20 MG tablet TAKE 3-5 TABLETS BY MOUTH DAILY AS NEEDED 08/24/17   Venia Carbon, MD     Allergies:    Allergies  Allergen Reactions  . Cilostazol      REACTION: stomach problems- constipation  . Simvastatin     REACTION: myalgia    Social History:   Social History   Socioeconomic History  . Marital status: Married    Spouse name: Not on file  . Number of children: 3  . Years of education: Not on file  . Highest education level: Not on file  Occupational History  . 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 Financial resource strain: Not on file  . Food insecurity    Worry: Not on file    Inability: Not on file  . Transportation needs    Medical: Not on file    Non-medical: Not on file  Tobacco Use  . Smoking status: Former Smoker    Types: Cigarettes    Quit date: 10/20/1992    Years since quitting: 26.9  . Smokeless tobacco: Never Used  . Tobacco comment: quit in 1994   Substance and Sexual Activity  . Alcohol use: Not Currently    Comment: rare  . Drug use: No  . Sexual activity: Not on file  Lifestyle  . Physical activity    Days per week: Not on file    Minutes per session: Not on file  . Stress: Not on file  Relationships  . Social Herbalist on phone: Not on file    Gets together: Not on file    Attends religious service: Not on file    Active member of club or organization: Not on file    Attends meetings of clubs or organizations: Not on file    Relationship status: Not on file  . Intimate partner violence    Fear of current or ex partner: Not on file    Emotionally abused: Not on file    Physically abused: Not on file    Forced sexual activity: Not on file  Other Topics Concern  . Not on file  Social History Narrative   Widowed; then remarried; 3 sons.      Has a living will - wife has health care POA    Son [name] would be alternate   Would want resuscitation attempts   Would accept brief trial of artificial nutrition      Pt signed designated party release form and gives Taaj Joncas [id number] (home #), access to medical records. Can also leave msg  on home answering machine. Cell # [phone]    Family History:   The patient's family history includes Coronary artery disease in his paternal grandfather; Diabetes in his father; Stroke in his father. There is no history of Colon cancer or Prostate cancer.    ROS:  Please see the history of present illness.  All other ROS reviewed and negative.     Physical Exam/Data:  There were no vitals filed for this visit. No intake or output data in the 24 hours ending 09/12/19 1448  Last 3 Weights 09/12/2019 09/12/2019 09/11/2019  Weight (lbs) 197 lb 5 oz 197 lb 4.8 oz 200 lb 2.8 oz  Weight (kg) 89.5 kg 89.495 kg 90.8 kg     There is no height or weight on file to calculate BMI.  General:  Well nourished, well developed elderly gentleman laying in bed in no acute distress HEENT: sclera anicteric Neck: no JVD Vascular: No carotid bruits; distal pulses 2+ bilaterally  Cardiac:  normal S1, S2; RRR; no murmurs, rubs, or gallops Lungs:  clear to auscultation bilaterally, no wheezing, rhonchi or rales  Abd: soft, protuberant, nontender, no hepatomegaly  Ext: no edema Musculoskeletal:  No deformities, BUE and BLE strength normal and equal Skin: warm and dry  Neuro:  CNs 2-12 intact, no focal abnormalities noted Psych:  Normal affect    EKG:  The ECG that was done 09/11/2019 was personally reviewed and demonstrates sinus rhythm with rate 74 bpm, anterolateral ST-T wave abnormalities  Relevant CV Studies: Left heart catheterization 09/12/2019:  Mid LM to Dist LM lesion is 85% stenosed.  Prox Cx lesion is 50% stenosed.  Prox LAD to Mid LAD lesion is 85% stenosed.  Prox LAD lesion is 70% stenosed.  Mid LAD lesion is 45% stenosed.  Prox RCA to Mid RCA lesion is 70% stenosed.   85 year old male with known cardiovascular disease status post previous stent placement without evidence of previous myocardial infarction and severe peripheral vascular disease with bifemoral bypass who has had  appropriate medication management having now unstable angina and non-ST elevation myocardial infarction.  Echocardiogram and LV angiogram showing normal LV systolic function with ejection fraction of 55 to 60%  Right coronary artery with proximal 70% stenosis Distal left main with 85% stenosis Proximal left anterior descending artery with 70% 60% and [id number] stenosis complicated Mid LAD stenosis of 45% Proximal circumflex stenosis of 50%  Plan Discussion of coronary artery bypass surgery due to severe left main LAD stenosis with right coronary artery stenosis Continue antiplatelet therapy but discontinuation of Plavix at this time for preparation for surgery High intensity cholesterol therapy Continuation of beta-blocker ACE inhibitor for myocardial infarction Cardiac rehabilitation after above  Echocardiogram 09/11/2019: 1. Left ventricular ejection fraction, by visual estimation, is 50 to 55%. The left ventricle has normal function. There is no left ventricular hypertrophy.  2. Global right ventricle has normal systolic function.The right ventricular size is normal. No increase in right ventricular wall thickness.  3. Left atrial size was normal.  4. Right atrial size was normal.  5. The mitral valve is normal in structure. Mild mitral valve regurgitation.  6. The tricuspid valve is normal in structure. Tricuspid valve regurgitation is mild.  7. The aortic valve is normal in structure. Aortic valve regurgitation is not visualized.  8. The pulmonic valve was grossly normal. Pulmonic valve regurgitation is trivial.  9. Mildly elevated pulmonary artery systolic pressure.  Laboratory Data:  High Sensitivity Troponin:   Recent Labs  Lab 09/11/19 0139 09/11/19 0334 09/11/19 0634 09/11/19 0806  TROPONINIHS 17 69* 81* 74*      Chemistry Recent Labs  Lab 09/11/19 0139  NA 141  K 4.2  CL 108  CO2 24  GLUCOSE 201*  BUN 28*  CREATININE 1.64*  CALCIUM 9.5  GFRNONAA 38*  GFRAA  44*  ANIONGAP 9    Recent Labs  Lab 09/11/19 0139  PROT 7.2  ALBUMIN 3.4*  AST 21  ALT 15  ALKPHOS 67  BILITOT 0.6   Hematology Recent Labs  Lab 09/11/19 0139  WBC 8.3  RBC 2.55*  HGB 9.6*  HCT 30.4*  MCV 119.2*  MCH 37.6*  MCHC 31.6  RDW 16.9*  PLT 148*   BNPNo results for input(s): BNP, PROBNP in the last 168 hours.  DDimer No results for input(s): DDIMER in the last 168 hours.   Radiology/Studies:  No results found.  Assessment and Plan:   1. Severe multivessel CAD in patient who presented with unstable angina: patient underwent LHC by Dr. [name] today and was found to have severe multivessel CAD, recommended for transfer to [facility] for [facility]G evaluation.  - TCTS aware - appreciate consult - Continue heparin gtt  - Continue aspirin - Continue metoprolol succinate  2. HTN: BP significantly elevated despite continuation of home meds - Will start amlodipine 5mg  daily - Continue metoprolol succinate 50mg  daily  - Will hold losartan pending Cr check in AM  3. HLD: LDL 92. Crestor increased to 20mg  daily - Continue crestor  4. Acute on CKD stage 3: Cr 1.64 yesterday, up from baseline 1.4. Now s/p LHC - Will follow-up Cr in AM  5. Anemia: Hgb 9.6 09/11/2019, down from 12.6 08/2018.  - Will check anemia panel  - Will follow-up Hgb in AM  6. Thrombocytopenia: PLT mildly decreased to 148 09/11/2019. Appears to be chronic - Continue to trend.   Severity of Illness: The appropriate patient status for this patient is INPATIENT. Inpatient status is judged to be reasonable and necessary in order to provide the required intensity of service to ensure the patient's safety. The patient's presenting symptoms, physical exam findings, and initial radiographic and laboratory data in the context of their chronic comorbidities is felt to place them at high risk for further clinical deterioration. Furthermore, it is not anticipated that the patient will be medically stable for  discharge from the hospital within 2 midnights of admission. The following factors support the patient status of inpatient.   " The patient's presenting symptoms include chest pain. " The worrisome physical exam findings include benign cardiopulmonary exam. " The initial radiographic and laboratory data are worrisome because of severe multivessel CAD on cardiac cath. " The chronic co-morbidities include CAD, HTN, HLD, CKD stage 3..   * I certify that at the point of admission it is my clinical judgment that the patient will require inpatient hospital care spanning beyond 2 midnights from the point of admission due to high intensity of service, high risk for further deterioration and high frequency of surveillance required.*    For questions or updates, please contact [place] Please consult [email or web address] for contact info under        Signed, Abigail Butts, PA-C  09/12/2019 2:48 PM

## 2019-09-12 NOTE — Plan of Care (Signed)

## 2019-09-12 NOTE — Progress Notes (Signed)
ANTICOAGULATION CONSULT NOTE - Initial Consult  Pharmacy Consult for heparin Indication: chest pain/ACS  Allergies  Allergen Reactions  . Cilostazol     REACTION: stomach problems- constipation  . Simvastatin     REACTION: myalgia    Patient Measurements: Height: 5\' 10"  (177.8 cm) Weight: 197 lb 12.8 oz (89.7 kg) IBW/kg (Calculated) : 73 Heparin Dosing Weight: TBW  Vital Signs: Temp: 98.8 F (37.1 C) (11/23 1724) Temp Source: Oral (11/23 1724) BP: 174/79 (11/23 1724) Pulse Rate: 89 (11/23 1724)  Labs: Recent Labs    09/11/19 0139 09/11/19 0334 09/11/19 0559 09/11/19 0634 09/11/19 0806 09/11/19 1348 09/11/19 2254  HGB 9.6*  --   --   --   --   --   --   HCT 30.4*  --   --   --   --   --   --   PLT 148*  --   --   --   --   --   --   APTT  --   --  29  --   --   --   --   LABPROT  --   --  13.5  --   --   --   --   INR  --   --  1.0  --   --   --   --   HEPARINUNFRC  --   --   --   --   --  0.24* 0.40  CREATININE 1.64*  --   --   --   --   --   --   TROPONINIHS 17 69*  --  81* 74*  --   --     Estimated Creatinine Clearance: 38.5 mL/min (A) (by C-G formula based on SCr of 1.64 mg/dL (H)).   Medical History: Past Medical History:  Diagnosis Date  . CAD (coronary artery disease)    s/p PCI after presenting with exertional angina (drug eluting stent)  . Diverticulosis   . GERD (gastroesophageal reflux disease)   . HTN (hypertension)   . Hyperlipidemia   . Osteoarthritis   . PVD (peripheral vascular disease) ([place])    s/p aortobifemoral bypass, with bilateral SFA occlusion and intermittent claudication   . Small bowel obstruction ([facility])    Assessment: 29 YOM presenting with CP to [facility] and started on heparin gtt therapeutic at 1150 units/hr yesterday however was not transported with gtt, transfer for possible CABG and to continue heparin here.   Goal of Therapy:  Heparin level 0.3-0.7 units/ml Monitor platelets by anticoagulation protocol: Yes   Plan:   Heparin 4000 units IV x1, and gtt at 1150 units/hr F/u 8 hour heparin level F/u CABG plans  Bertis Ruddy, PharmD Clinical Pharmacist Please check AMION for all [place] numbers 09/12/2019 6:15 PM

## 2019-09-12 NOTE — Progress Notes (Signed)
[facility] Encounter Note  Patient: Brandon Lewis / Admit Date: 09/11/2019 / Date of Encounter: 09/12/2019, 8:33 AM   Subjective: Patient is done very well overnight with no evidence of significant new chest discomfort or congestive heart failure type symptoms.  Troponin level slightly elevated consistent with non-ST elevation myocardial infarction Cardiac catheterization showing normal LV systolic function with ejection fraction of 55% Right coronary artery showing 70% proximal stenosis Distal left main showing 80% stenosis Proximal LAD showing 75 and 70% stenosis Left circumflex with minor atherosclerosis  Review of Systems: Positive for: None Negative for: Vision change, hearing change, syncope, dizziness, nausea, vomiting,diarrhea, bloody stool, stomach pain, cough, congestion, diaphoresis, urinary frequency, urinary pain,skin lesions, skin rashes Others previously listed  Objective: Telemetry: Normal sinus rhythm Physical Exam: Blood pressure (!) 157/95, pulse 76, temperature 97.8 F (36.6 C), temperature source Oral, resp. rate 19, height 5\' 10"  (1.778 m), weight 89.5 kg, SpO2 97 %. Body mass index is 28.31 kg/m. General: Well developed, well nourished, in no acute distress. Head: Normocephalic, atraumatic, sclera non-icteric, no xanthomas, nares are without discharge. Neck: No apparent masses Lungs: Normal respirations with no wheezes, no rhonchi, no rales , no crackles   Heart: Regular rate and rhythm, normal S1 S2, no murmur, no rub, no gallop, PMI is normal size and placement, carotid upstroke normal without bruit, jugular venous pressure normal Abdomen: Soft, non-tender, non-distended with normoactive bowel sounds. No hepatosplenomegaly. Abdominal aorta is normal size without bruit Extremities: No edema, no clubbing, no cyanosis, no ulcers,  Peripheral: 2+ radial, 2+ femoral, 2+ dorsal pedal pulses Neuro: Alert and oriented. Moves all  extremities spontaneously. Psych:  Responds to questions appropriately with a normal affect.   Intake/Output Summary (Last 24 hours) at 09/12/2019 0833 Last data filed at 09/12/2019 0715 Gross per 24 hour  Intake 2218.56 ml  Output 3000 ml  Net -781.44 ml    Inpatient Medications:  . [MAR Hold] alum & mag hydroxide-simeth  30 mL Oral Once   And  . [MAR Hold] lidocaine  15 mL Oral Once  . aspirin      . [MAR Hold] aspirin EC  325 mg Oral Daily  . [MAR Hold] cholecalciferol  1,000 Units Oral Daily  . [MAR Hold] clopidogrel  75 mg Oral Daily  . influenza vaccine adjuvanted  0.5 mL Intramuscular Tomorrow-1000  . [MAR Hold] metoprolol succinate  50 mg Oral Daily  . [MAR Hold] multivitamin with minerals  1 tablet Oral Daily  . [MAR Hold] rosuvastatin  10 mg Oral q1800  . [MAR Hold] sodium chloride flush  3 mL Intravenous Q12H   Infusions:  . sodium chloride 272 mL/hr at 09/12/19 0628  . sodium chloride    . [START ON 09/13/2019] sodium chloride 3 mL/kg/hr (09/12/19 0618)   Followed by  . [START ON 09/13/2019] sodium chloride    . heparin Stopped (09/12/19 0715)    Labs: Recent Labs    09/11/19 0139  NA 141  K 4.2  CL 108  CO2 24  GLUCOSE 201*  BUN 28*  CREATININE 1.64*  CALCIUM 9.5   Recent Labs    09/11/19 0139  AST 21  ALT 15  ALKPHOS 67  BILITOT 0.6  PROT 7.2  ALBUMIN 3.4*   Recent Labs    09/11/19 0139  WBC 8.3  HGB 9.6*  HCT 30.4*  MCV 119.2*  PLT 148*   No results for input(s): CKTOTAL, CKMB, TROPONINI in the last 72 hours. Invalid input(s): POCBNP No  results for input(s): HGBA1C in the last 72 hours.   Weights: Filed Weights   09/11/19 0900 09/12/19 0436 09/12/19 0710  Weight: 90.8 kg 89.5 kg 89.5 kg     Radiology/Studies:  Dg Chest 2 View  Result Date: 09/11/2019 CLINICAL DATA:  Chest pain EXAM: CHEST - 2 VIEW COMPARISON:  None. FINDINGS: No consolidation or effusion. Scarring or atelectasis at the lingula. Normal heart size. No  pneumothorax. Chronic appearing wedging deformities at the thoracolumbar junction. IMPRESSION: No active cardiopulmonary disease. Scarring or atelectasis at the lingula. Electronically Signed   By: Donavan Foil M.D.   On: 09/11/2019 02:23     Assessment and Recommendation  85 y.o. male with known peripheral vascular disease coronary atherosclerosis status post previous stent placement hypertension hyperlipidemia previously on appropriate medication management with acute non-ST elevation myocardial infarction. Cardiac catheterization showing significant left main and LAD stenosis with right coronary atherosclerosis likely to best benefit from coronary artery bypass surgery 1.  Continue antiplatelet therapy with discontinuation of Plavix if patient is to proceed onto coronary artery bypass surgery 2.  Continue high intensity cholesterol therapy 3.  Hypertension hyperlipidemia treatment with beta-blocker ACE inhibitor for non-ST elevation myocardial infarction 4.  Plan for discussion with family for the possibility of coronary bypass surgery and other intervention treatment  Signed, Serafina Royals M.D. FACC

## 2019-09-13 ENCOUNTER — Other Ambulatory Visit: Payer: Self-pay

## 2019-09-13 ENCOUNTER — Telehealth: Payer: Self-pay | Admitting: Internal Medicine

## 2019-09-13 ENCOUNTER — Other Ambulatory Visit (HOSPITAL_COMMUNITY): Payer: Medicare Other

## 2019-09-13 ENCOUNTER — Encounter (HOSPITAL_COMMUNITY): Payer: Self-pay | Admitting: General Practice

## 2019-09-13 LAB — CBC
HCT: 32.4 % — ABNORMAL LOW (ref 39.0–52.0)
Hemoglobin: 10.4 g/dL — ABNORMAL LOW (ref 13.0–17.0)
MCH: 37.7 pg — ABNORMAL HIGH (ref 26.0–34.0)
MCHC: 32.1 g/dL (ref 30.0–36.0)
MCV: 117.4 fL — ABNORMAL HIGH (ref 80.0–100.0)
Platelets: 148 10*3/uL — ABNORMAL LOW (ref 150–400)
RBC: 2.76 MIL/uL — ABNORMAL LOW (ref 4.22–5.81)
RDW: 16.7 % — ABNORMAL HIGH (ref 11.5–15.5)
WBC: 9.8 10*3/uL (ref 4.0–10.5)
nRBC: 0 % (ref 0.0–0.2)

## 2019-09-13 LAB — RETICULOCYTES
Immature Retic Fract: 32.9 % — ABNORMAL HIGH (ref 2.3–15.9)
RBC.: 2.76 MIL/uL — ABNORMAL LOW (ref 4.22–5.81)
Retic Count, Absolute: 59.6 10*3/uL (ref 19.0–186.0)
Retic Ct Pct: 2.2 % (ref 0.4–3.1)

## 2019-09-13 LAB — BASIC METABOLIC PANEL
Anion gap: 6 (ref 5–15)
BUN: 22 mg/dL (ref 8–23)
CO2: 20 mmol/L — ABNORMAL LOW (ref 22–32)
Calcium: 9.3 mg/dL (ref 8.9–10.3)
Chloride: 113 mmol/L — ABNORMAL HIGH (ref 98–111)
Creatinine, Ser: 1.33 mg/dL — ABNORMAL HIGH (ref 0.61–1.24)
GFR calc Af Amer: 57 mL/min — ABNORMAL LOW (ref >60)
GFR calc non Af Amer: 49 mL/min — ABNORMAL LOW (ref >60)
Glucose, Bld: 126 mg/dL — ABNORMAL HIGH (ref 70–99)
Potassium: 5 mmol/L (ref 3.5–5.1)
Sodium: 139 mmol/L (ref 135–145)

## 2019-09-13 LAB — FERRITIN: Ferritin: 135 ng/mL (ref 24–336)

## 2019-09-13 LAB — VITAMIN B12: Vitamin B-12: 384 pg/mL (ref 180–914)

## 2019-09-13 LAB — IRON AND TIBC
Iron: 60 ug/dL (ref 45–182)
Saturation Ratios: 25 % (ref 17.9–39.5)
TIBC: 244 ug/dL — ABNORMAL LOW (ref 250–450)
UIBC: 184 ug/dL

## 2019-09-13 LAB — HEPARIN LEVEL (UNFRACTIONATED)
Heparin Unfractionated: 0.29 IU/mL — ABNORMAL LOW (ref 0.30–0.70)
Heparin Unfractionated: 0.27 IU/mL — ABNORMAL LOW (ref 0.30–0.70)

## 2019-09-13 LAB — FOLATE: Folate: 28.8 ng/mL (ref >5.9)

## 2019-09-13 MED ORDER — PNEUMOCOCCAL VAC POLYVALENT 25 MCG/0.5ML IJ INJ
0.5000 mL | INJECTION | Freq: Once | INTRAMUSCULAR | Status: AC
  Administered 2019-09-28: 09:00:00 0.5 mL via INTRAMUSCULAR
  Filled 2019-09-13 (×2): qty 0.5

## 2019-09-13 NOTE — Progress Notes (Signed)
Progress Note  Patient Name: Brandon Lewis Date of Encounter: 09/13/2019  Primary Cardiologist: Corey Skains, MD   Subjective   Denies any chest pain or SOB  Inpatient Medications    Scheduled Meds: . amLODipine  5 mg Oral Daily  . aspirin EC  81 mg Oral Daily  . metoprolol succinate  50 mg Oral Daily  . pneumococcal 23 valent vaccine  0.5 mL Intramuscular Once  . rosuvastatin  20 mg Oral q1800   Continuous Infusions: . heparin 1,250 Units/hr (09/13/19 0416)   PRN Meds: acetaminophen, nitroGLYCERIN, ondansetron (ZOFRAN) IV   Vital Signs    Vitals:   09/13/19 0434 09/13/19 0500 09/13/19 0729 09/13/19 0817  BP: (!) 154/72  138/64 (!) 151/73  Pulse: 80  82 72  Resp: 16  18   Temp: 98.5 F (36.9 C)  98.2 F (36.8 C)   TempSrc: Oral  Oral   SpO2: 98%  98%   Weight:  88.6 kg    Height:        Intake/Output Summary (Last 24 hours) at 09/13/2019 1011 Last data filed at 09/13/2019 0829 Gross per 24 hour  Intake 600 ml  Output 1550 ml  Net -950 ml   Filed Weights   09/12/19 1729 09/13/19 0500  Weight: 89.7 kg 88.6 kg    Telemetry    NSR - Personally Reviewed  ECG    NSR with no ST changes - Personally Reviewed  Physical Exam   GEN: No acute distress.   Neck: No JVD Cardiac: RRR, no murmurs, rubs, or gallops.  Respiratory: Clear to auscultation bilaterally. GI: Soft, nontender, non-distended  MS: No edema; No deformity. Neuro:  Nonfocal  Psych: Normal affect   Labs    Chemistry Recent Labs  Lab 09/11/19 0139 09/12/19 1951 09/13/19 0311  NA 141 138 139  K 4.2 5.3* 5.0  CL 108 111 113*  CO2 24 21* 20*  GLUCOSE 201* 162* 126*  BUN 28* 24* 22  CREATININE 1.64* 1.40* 1.33*  CALCIUM 9.5 9.6 9.3  PROT 7.2  --   --   ALBUMIN 3.4*  --   --   AST 21  --   --   ALT 15  --   --   ALKPHOS 67  --   --   BILITOT 0.6  --   --   GFRNONAA 38* 46* 49*  GFRAA 44* 53* 57*  ANIONGAP 9 6 6      Hematology Recent Labs  Lab 09/11/19 0139  09/13/19 0311  WBC 8.3 9.8  RBC 2.55* 2.76*  2.76*  HGB 9.6* 10.4*  HCT 30.4* 32.4*  MCV 119.2* 117.4*  MCH 37.6* 37.7*  MCHC 31.6 32.1  RDW 16.9* 16.7*  PLT 148* 148*    Cardiac EnzymesNo results for input(s): TROPONINI in the last 168 hours. No results for input(s): TROPIPOC in the last 168 hours.   BNPNo results for input(s): BNP, PROBNP in the last 168 hours.   DDimer No results for input(s): DDIMER in the last 168 hours.   Radiology    No results found.  Cardiac Studies   Left heart catheterization 09/12/2019:  Mid LM to Dist LM lesion is 85% stenosed.  Prox Cx lesion is 50% stenosed.  Prox LAD to Mid LAD lesion is 85% stenosed.  Prox LAD lesion is 70% stenosed.  Mid LAD lesion is 45% stenosed.  Prox RCA to Mid RCA lesion is 70% stenosed.  85 year old male with known cardiovascular disease status post  previous stent placement without evidence of previous myocardial infarction and severe peripheral vascular disease with bifemoral bypass who has had appropriate medication management having now unstable angina and non-ST elevation myocardial infarction.  Echocardiogram and LV angiogram showing normal LV systolic function with ejection fraction of 55 to 60%  Right coronary artery with proximal 70% stenosis Distal left main with 85% stenosis Proximal left anterior descending artery with 70% 60% and [id number] stenosis complicated Mid LAD stenosis of 45% Proximal circumflex stenosis of 50%  Plan Discussion of coronary artery bypass surgery due to severe left main LAD stenosis with right coronary artery stenosis Continue antiplatelet therapy but discontinuation of Plavix at this time for preparation for surgery High intensity cholesterol therapy Continuation of beta-blocker ACE inhibitor for myocardial infarction Cardiac rehabilitation after above  Echocardiogram 09/11/2019: 1. Left ventricular ejection fraction, by visual estimation, is 50 to 55%. The left  ventricle has normal function. There is no left ventricular hypertrophy. 2. Global right ventricle has normal systolic function.The right ventricular size is normal. No increase in right ventricular wall thickness. 3. Left atrial size was normal. 4. Right atrial size was normal. 5. The mitral valve is normal in structure. Mild mitral valve regurgitation. 6. The tricuspid valve is normal in structure. Tricuspid valve regurgitation is mild. 7. The aortic valve is normal in structure. Aortic valve regurgitation is not visualized. 8. The pulmonic valve was grossly normal. Pulmonic valve regurgitation is trivial. 9. Mildly elevated pulmonary artery systolic pressure.  Patient Profile     85 y.o. male  with PMH of CAD s/p remote PCI/DES, PVD s/p aortobifemoral bypass and bilateral superficial femoral artery occlusion, HTN, and HLD, who presented to [facility] with unstable angina where he was found to have severe multivessel CAD and transferred to [facility] for CABG evaluation.  Assessment & Plan    1. Severe multivessel CAD in patient who presented with unstable angina: - LHC by Dr. [name] showed severe multivessel CAD - await CVTS consult - denies any anginal sx - continue IV Heparin gtt, ASA, BB and statin  2. HTN:  - BP remains elevated but improved after adding amlodipine - continue amlodipine 5mg  daily and titrate as needed - Continue metoprolol succinate 50mg  daily  - Losartan on hold for elevated Cr - Creatinine improved 1.4>>1.33  3. HLD:  -LDL 92.  -Crestor increased to 20mg  daily -will need FLP and ALT in 6 weeks  4. Acute on CKD stage 3:  -Cr 1.64 at peak up from baseline 1.4.  -Cr improved today to 1.33  5. Anemia:  - Hgb 9.6 09/11/2019, down from 12.6 08/2018.  - Likely chronic dz - anemia panel normal - Hbg 10.4 today  6. Thrombocytopenia:  -PLT mildly decreased to 148. Appears to be chronic - Continue to trend.   For questions or updates, please contact [place] Please consult [email or web address] for contact info under Cardiology/STEMI.      Signed, Fransico Him, MD  09/13/2019, 10:11 AM

## 2019-09-13 NOTE — Progress Notes (Signed)
ANTICOAGULATION CONSULT NOTE - Initial Consult  Pharmacy Consult for heparin Indication: chest pain/ACS  Patient Measurements: Height: 5\' 10"  (177.8 cm) Weight: 195 lb 4.8 oz (88.6 kg)(scale a) IBW/kg (Calculated) : 73 Heparin Dosing Weight: TBW  Vital Signs: Temp: 98.2 F (36.8 C) (11/24 1119) Temp Source: Oral (11/24 1119) BP: 137/70 (11/24 1119) Pulse Rate: 69 (11/24 1119)  Labs: Recent Labs    09/11/19 0139 09/11/19 0334 09/11/19 0559 09/11/19 0634 09/11/19 0806  09/11/19 2254 09/12/19 1951 09/13/19 0311 09/13/19 1330  HGB 9.6*  --   --   --   --   --   --   --  10.4*  --   HCT 30.4*  --   --   --   --   --   --   --  32.4*  --   PLT 148*  --   --   --   --   --   --   --  148*  --   APTT  --   --  29  --   --   --   --   --   --   --   LABPROT  --   --  13.5  --   --   --   --   --   --   --   INR  --   --  1.0  --   --   --   --   --   --   --   HEPARINUNFRC  --   --   --   --   --    < > 0.40  --  0.29* 0.27*  CREATININE 1.64*  --   --   --   --   --   --  1.40* 1.33*  --   TROPONINIHS 17 69*  --  81* 74*  --   --   --   --   --    < > = values in this interval not displayed.    Estimated Creatinine Clearance: 47.1 mL/min (A) (by C-G formula based on SCr of 1.33 mg/dL (H)).  Assessment: 87 YOM presenting with CP to [facility] and started on heparin gtt therapeutic at 1150 units/hr 11/22 however was not transported with gtt. Admitted here for possible CABG, to continue heparin here.    HL still subtherapeutic despite bolus and rate increase.  CABG consult pending. H/H & plt stable   Goal of Therapy:  Heparin level 0.3-0.7 units/ml Monitor platelets by anticoagulation protocol: Yes   Plan:  Increase to heparin 1350 units/hr F/u 8 hour heparin level F/u CABG plans Monitor daily HL, CBC, plt Monitor for signs/symptoms of bleeding    Benetta Spar, PharmD, BCPS, BCCP Clinical Pharmacist  Please check AMION for all [place] phone numbers After 10:00 PM,  call [place]

## 2019-09-13 NOTE — Progress Notes (Signed)
[id number] Gave pt and son OHS booklet and staying in the tube handout. Discussed sternal precautions. Wrote down how to view pre op video. Gave IS and pt able to demonstrate 2000 ml correctly. Encouraged pt to use occasionally prior to surgery but not to strain with IS. Pt has wife who is wheel chair bound and he is concerned for her care. Pt's son lives a few blocks away. Pt will need to see case manager after surgery.  Discussed how important mobility is after surgery. Understanding voiced by both. Will follow up after surgery. Graylon Good RN BSN 09/13/2019 3:19 PM

## 2019-09-13 NOTE — Progress Notes (Signed)
Brandon Lewis for Heparin Indication: chest pain/ACS  Allergies  Allergen Reactions  . Cilostazol Other (See Comments)    REACTION: stomach problems- constipation  . Simvastatin Other (See Comments)    REACTION: myalgia    Patient Measurements: Height: 5\' 10"  (177.8 cm) Weight: 197 lb 12.8 oz (89.7 kg) IBW/kg (Calculated) : 73 Heparin Dosing Weight: TBW  Vital Signs: Temp: 98.3 F (36.8 C) (11/24 0011) Temp Source: Oral (11/24 0011) BP: 129/67 (11/24 0011) Pulse Rate: 81 (11/24 0011)  Labs: Recent Labs    09/11/19 0139 09/11/19 0334 09/11/19 0559 09/11/19 0634 09/11/19 0806 09/11/19 1348 09/11/19 2254 09/12/19 1951 09/13/19 0311  HGB 9.6*  --   --   --   --   --   --   --  10.4*  HCT 30.4*  --   --   --   --   --   --   --  32.4*  PLT 148*  --   --   --   --   --   --   --  148*  APTT  --   --  29  --   --   --   --   --   --   LABPROT  --   --  13.5  --   --   --   --   --   --   INR  --   --  1.0  --   --   --   --   --   --   HEPARINUNFRC  --   --   --   --   --  0.24* 0.40  --  0.29*  CREATININE 1.64*  --   --   --   --   --   --  1.40* 1.33*  TROPONINIHS 17 69*  --  81* 74*  --   --   --   --     Estimated Creatinine Clearance: 47.4 mL/min (A) (by C-G formula based on SCr of 1.33 mg/dL (H)).   Medical History: Past Medical History:  Diagnosis Date  . CAD (coronary artery disease)    s/p PCI after presenting with exertional angina (drug eluting stent)  . Diverticulosis   . GERD (gastroesophageal reflux disease)   . HTN (hypertension)   . Hyperlipidemia   . Osteoarthritis   . PVD (peripheral vascular disease) ([place])    s/p aortobifemoral bypass, with bilateral SFA occlusion and intermittent claudication   . Small bowel obstruction (HCC)    Assessment: 52 YOM presenting with CP to [facility] and started on heparin gtt therapeutic at 1150 units/hr yesterday however was not transported with gtt, transfer for possible CABG  and to continue heparin here.   11/24 AM update:  Heparin level just below goal No issues per RN Awaiting CVTS consult   Goal of Therapy:  Heparin level 0.3-0.7 units/ml Monitor platelets by anticoagulation protocol: Yes   Plan:  Inc heparin to 1250 units/hr 1200 heparin level   Narda Bonds, PharmD, BCPS Clinical Pharmacist Phone: [phone]

## 2019-09-13 NOTE — Telephone Encounter (Signed)
I called patient to confirm his appointment for 09/19/19 for a physical with Dr.Letvak. There was no answer.  Patient returned my call and said he's in [facility].  He had a heart attack on Sunday.  Patient wanted to let Dr.Letvak know he's feeling well and he hasn't had anymore pain.  I let him know we'll probably be scheduling a hospital follow up and we'll reschedule the cpx after he sees Dr.Letvak for the hospital follow up.

## 2019-09-14 ENCOUNTER — Inpatient Hospital Stay (HOSPITAL_COMMUNITY): Payer: Medicare Other

## 2019-09-14 DIAGNOSIS — E041 Nontoxic single thyroid nodule: Secondary | ICD-10-CM

## 2019-09-14 DIAGNOSIS — Z01810 Encounter for preprocedural cardiovascular examination: Secondary | ICD-10-CM | POA: Diagnosis not present

## 2019-09-14 HISTORY — DX: Nontoxic single thyroid nodule: E04.1

## 2019-09-14 LAB — CBC
HCT: 33.2 % — ABNORMAL LOW (ref 39.0–52.0)
Hemoglobin: 10.6 g/dL — ABNORMAL LOW (ref 13.0–17.0)
MCH: 38.3 pg — ABNORMAL HIGH (ref 26.0–34.0)
MCHC: 31.9 g/dL (ref 30.0–36.0)
MCV: 119.9 fL — ABNORMAL HIGH (ref 80.0–100.0)
Platelets: 148 10*3/uL — ABNORMAL LOW (ref 150–400)
RBC: 2.77 MIL/uL — ABNORMAL LOW (ref 4.22–5.81)
RDW: 16.6 % — ABNORMAL HIGH (ref 11.5–15.5)
WBC: 10.2 10*3/uL (ref 4.0–10.5)
nRBC: 0 % (ref 0.0–0.2)

## 2019-09-14 LAB — HEPARIN LEVEL (UNFRACTIONATED)
Heparin Unfractionated: 0.41 IU/mL (ref 0.30–0.70)
Heparin Unfractionated: 0.55 IU/mL (ref 0.30–0.70)

## 2019-09-14 LAB — PROTIME-INR
INR: 1.1 (ref 0.8–1.2)
Prothrombin Time: 13.7 seconds (ref 11.4–15.2)

## 2019-09-14 MED ORDER — POTASSIUM CHLORIDE 2 MEQ/ML IV SOLN
80.0000 meq | INTRAVENOUS | Status: DC
  Filled 2019-09-14: qty 40

## 2019-09-14 MED ORDER — DOPAMINE-DEXTROSE 3.2-5 MG/ML-% IV SOLN
0.0000 ug/kg/min | INTRAVENOUS | Status: DC
  Filled 2019-09-14: qty 250

## 2019-09-14 MED ORDER — NITROGLYCERIN IN D5W 200-5 MCG/ML-% IV SOLN
2.0000 ug/min | INTRAVENOUS | Status: DC
  Filled 2019-09-14: qty 250

## 2019-09-14 MED ORDER — SODIUM CHLORIDE 0.9 % IV SOLN
750.0000 mg | INTRAVENOUS | Status: AC
  Administered 2019-09-16: 750 mg via INTRAVENOUS
  Filled 2019-09-14: qty 750

## 2019-09-14 MED ORDER — SODIUM CHLORIDE 0.9 % IV SOLN
INTRAVENOUS | Status: DC
  Filled 2019-09-14: qty 30

## 2019-09-14 MED ORDER — MAGNESIUM SULFATE 50 % IJ SOLN
40.0000 meq | INTRAMUSCULAR | Status: DC
  Filled 2019-09-14: qty 9.85

## 2019-09-14 MED ORDER — TRANEXAMIC ACID (OHS) BOLUS VIA INFUSION
15.0000 mg/kg | INTRAVENOUS | Status: AC
  Administered 2019-09-16: 1312.5 mg via INTRAVENOUS
  Filled 2019-09-14: qty 1313

## 2019-09-14 MED ORDER — PHENYLEPHRINE HCL-NACL 20-0.9 MG/250ML-% IV SOLN
30.0000 ug/min | INTRAVENOUS | Status: AC
  Administered 2019-09-16: 25 ug/min via INTRAVENOUS
  Filled 2019-09-14: qty 250

## 2019-09-14 MED ORDER — TAB-A-VITE/IRON PO TABS
1.0000 | ORAL_TABLET | Freq: Every day | ORAL | Status: DC
  Administered 2019-09-14 – 2019-09-20 (×6): 1 via ORAL
  Filled 2019-09-14 (×8): qty 1

## 2019-09-14 MED ORDER — VANCOMYCIN HCL 10 G IV SOLR
1500.0000 mg | INTRAVENOUS | Status: AC
  Administered 2019-09-16: 1500 mg via INTRAVENOUS
  Filled 2019-09-14: qty 1500

## 2019-09-14 MED ORDER — DEXMEDETOMIDINE HCL IN NACL 400 MCG/100ML IV SOLN
0.1000 ug/kg/h | INTRAVENOUS | Status: AC
  Administered 2019-09-16: .3 ug/kg/h via INTRAVENOUS
  Filled 2019-09-14: qty 100

## 2019-09-14 MED ORDER — TRANEXAMIC ACID 1000 MG/10ML IV SOLN
1.5000 mg/kg/h | INTRAVENOUS | Status: AC
  Administered 2019-09-16: 1.5 mg/kg/h via INTRAVENOUS
  Filled 2019-09-14: qty 25

## 2019-09-14 MED ORDER — PLASMA-LYTE 148 IV SOLN
INTRAVENOUS | Status: DC
  Filled 2019-09-14: qty 2.5

## 2019-09-14 MED ORDER — MILRINONE LACTATE IN DEXTROSE 20-5 MG/100ML-% IV SOLN
0.3000 ug/kg/min | INTRAVENOUS | Status: DC
  Filled 2019-09-14: qty 100

## 2019-09-14 MED ORDER — TRANEXAMIC ACID (OHS) PUMP PRIME SOLUTION
2.0000 mg/kg | INTRAVENOUS | Status: DC
  Filled 2019-09-14: qty 1.75

## 2019-09-14 MED ORDER — EPINEPHRINE HCL 5 MG/250ML IV SOLN IN NS
0.0000 ug/min | INTRAVENOUS | Status: DC
  Filled 2019-09-14: qty 250

## 2019-09-14 MED ORDER — NOREPINEPHRINE 4 MG/250ML-% IV SOLN
0.0000 ug/min | INTRAVENOUS | Status: DC
  Filled 2019-09-14: qty 250

## 2019-09-14 MED ORDER — INSULIN REGULAR(HUMAN) IN NACL 100-0.9 UT/100ML-% IV SOLN
INTRAVENOUS | Status: AC
  Administered 2019-09-16: 1 [IU]/h via INTRAVENOUS
  Filled 2019-09-14: qty 100

## 2019-09-14 MED ORDER — SODIUM CHLORIDE 0.9 % IV SOLN
1.5000 g | INTRAVENOUS | Status: AC
  Administered 2019-09-16: 1.5 g via INTRAVENOUS
  Filled 2019-09-14: qty 1.5

## 2019-09-14 NOTE — Progress Notes (Addendum)
ANTICOAGULATION CONSULT NOTE - Initial Consult  Pharmacy Consult for heparin Indication: chest pain/ACS  Patient Measurements: Height: 5\' 10"  (177.8 cm) Weight: 193 lb (87.5 kg)(scale a) IBW/kg (Calculated) : 73 Heparin Dosing Weight: 87.5kg  Vital Signs: Temp: 97.9 F (36.6 C) (11/25 1123) Temp Source: Oral (11/25 1123) BP: 127/67 (11/25 1123) Pulse Rate: 69 (11/25 1123)  Labs: Recent Labs    09/12/19 1951 09/13/19 0311 09/13/19 1330 09/14/19 0415 09/14/19 0636  HGB  --  10.4*  --   --  10.6*  HCT  --  32.4*  --   --  33.2*  PLT  --  148*  --   --  148*  HEPARINUNFRC  --  0.29* 0.27* 0.41  --   CREATININE 1.40* 1.33*  --   --   --     Estimated Creatinine Clearance: 43.5 mL/min (A) (by C-G formula based on SCr of 1.33 mg/dL (H)).  Assessment: 84 YOM presenting with CP to [facility] and started on heparin gtt therapeutic at 1150 units/hr 11/22 however was not transported with gtt. Admitted here for possible CABG, to continue heparin here.    HL therapeutic, planned for CABG 11/27. H/H & plt stable   UPDATE: confirmatory HL 0.55, continue to monitor daily HL.  Goal of Therapy:  Heparin level 0.3-0.7 units/ml Monitor platelets by anticoagulation protocol: Yes   Plan:  Continue heparin 1350 units/hr Planned CABG 11/27  Monitor daily HL, CBC, plt Monitor for signs/symptoms of bleeding    Benetta Spar, PharmD, BCPS, BCCP Clinical Pharmacist  Please check AMION for all [place] phone numbers After 10:00 PM, call [place] [phone]

## 2019-09-14 NOTE — Progress Notes (Signed)
Pre CABG vascular testing       has been completed. Preliminary results can be found under CV proc through chart review. June Leap, BS, RDMS, RVT

## 2019-09-14 NOTE — Telephone Encounter (Signed)
Yes---I knew about that but have not been able to reach him at the hospital I will try again

## 2019-09-14 NOTE — Consult Note (Signed)
TCTS Consult Note preliminary  Pt seen and examined in the presence of his sone yesterday. I have reviewed his available imaging and medical history. Full consult note to follow. 80% left main with rest angina and NSTEMI. Plan CABG on Friday after brief clopidogrel washout. Additional routine pre-CABG work-up ordered and on-going. Sekou Zuckerman Z. Orvan Seen, [place]

## 2019-09-14 NOTE — Progress Notes (Signed)
Progress Note  Patient Name: Brandon Lewis Date of Encounter: 09/14/2019  Primary Cardiologist: Corey Skains, MD   Subjective   Chest pain free. He complains of intermittent breathlessness. No signs of volume overload, lungs clear. Recommended IS 10x per hr.  Inpatient Medications    Scheduled Meds: . amLODipine  5 mg Oral Daily  . aspirin EC  81 mg Oral Daily  . metoprolol succinate  50 mg Oral Daily  . multivitamins with iron  1 tablet Oral Daily  . pneumococcal 23 valent vaccine  0.5 mL Intramuscular Once  . rosuvastatin  20 mg Oral q1800   Continuous Infusions: . heparin 1,350 Units/hr (09/14/19 0618)   PRN Meds: acetaminophen, nitroGLYCERIN, ondansetron (ZOFRAN) IV   Vital Signs    Vitals:   09/13/19 1954 09/14/19 0003 09/14/19 0441 09/14/19 0726  BP: (!) 123/54 130/82 (!) 107/55 114/67  Pulse: 79 73 71 70  Resp: 18 20 16 20   Temp: 98.2 F (36.8 C) (!) 97.5 F (36.4 C) 98.7 F (37.1 C) 97.8 F (36.6 C)  TempSrc: Oral Oral Oral Oral  SpO2: 98% 97% 97% 96%  Weight:   87.5 kg   Height:        Intake/Output Summary (Last 24 hours) at 09/14/2019 0743 Last data filed at 09/14/2019 [id number] Gross per 24 hour  Intake 840 ml  Output 1075 ml  Net -235 ml   Last 3 Weights 09/14/2019 09/13/2019 09/12/2019  Weight (lbs) 193 lb 195 lb 4.8 oz 197 lb 12.8 oz  Weight (kg) 87.544 kg 88.587 kg 89.721 kg      Telemetry    Sinus rhythm in the 80s - Personally Reviewed  ECG    No new tracings - Personally Reviewed  Physical Exam   GEN: No acute distress.   Neck: No JVD Cardiac: RRR, no murmurs, rubs, or gallops.  Respiratory: Clear to auscultation bilaterally. GI: Soft, nontender, non-distended  MS: No edema; No deformity. Neuro:  Nonfocal  Psych: Normal affect   Labs    High Sensitivity Troponin:   Recent Labs  Lab 09/11/19 0139 09/11/19 0334 09/11/19 0634 09/11/19 0806  TROPONINIHS 17 69* 81* 74*      Chemistry Recent Labs  Lab  09/11/19 0139 09/12/19 1951 09/13/19 0311  NA 141 138 139  K 4.2 5.3* 5.0  CL 108 111 113*  CO2 24 21* 20*  GLUCOSE 201* 162* 126*  BUN 28* 24* 22  CREATININE 1.64* 1.40* 1.33*  CALCIUM 9.5 9.6 9.3  PROT 7.2  --   --   ALBUMIN 3.4*  --   --   AST 21  --   --   ALT 15  --   --   ALKPHOS 67  --   --   BILITOT 0.6  --   --   GFRNONAA 38* 46* 49*  GFRAA 44* 53* 57*  ANIONGAP 9 6 6      Hematology Recent Labs  Lab 09/11/19 0139 09/13/19 0311 09/14/19 0636  WBC 8.3 9.8 10.2  RBC 2.55* 2.76*  2.76* 2.77*  HGB 9.6* 10.4* 10.6*  HCT 30.4* 32.4* 33.2*  MCV 119.2* 117.4* 119.9*  MCH 37.6* 37.7* 38.3*  MCHC 31.6 32.1 31.9  RDW 16.9* 16.7* 16.6*  PLT 148* 148* 148*    BNPNo results for input(s): BNP, PROBNP in the last 168 hours.   DDimer No results for input(s): DDIMER in the last 168 hours.   Radiology    No results found.  Cardiac Studies  Left heart catheterization 09/12/2019:  Mid LM to Dist LM lesion is 85% stenosed.  Prox Cx lesion is 50% stenosed.  Prox LAD to Mid LAD lesion is 85% stenosed.  Prox LAD lesion is 70% stenosed.  Mid LAD lesion is 45% stenosed.  Prox RCA to Mid RCA lesion is 70% stenosed.  85 year old male with known cardiovascular disease status post previous stent placement without evidence of previous myocardial infarction and severe peripheral vascular disease with bifemoral bypass who has had appropriate medication management having now unstable angina and non-ST elevation myocardial infarction.  Echocardiogram and LV angiogram showing normal LV systolic function with ejection fraction of 55 to 60%  Right coronary artery with proximal 70% stenosis Distal left main with 85% stenosis Proximal left anterior descending artery with 70% 60% and [id number] stenosis complicated Mid LAD stenosis of 45% Proximal circumflex stenosis of 50%  Plan Discussion of coronary artery bypass surgery due to severe left main LAD stenosis with right  coronary artery stenosis Continue antiplatelet therapy but discontinuation of Plavix at this time for preparation for surgery High intensity cholesterol therapy Continuation of beta-blocker ACE inhibitor for myocardial infarction Cardiac rehabilitation after above  Echocardiogram 09/11/2019: 1. Left ventricular ejection fraction, by visual estimation, is 50 to 55%. The left ventricle has normal function. There is no left ventricular hypertrophy. 2. Global right ventricle has normal systolic function.The right ventricular size is normal. No increase in right ventricular wall thickness. 3. Left atrial size was normal. 4. Right atrial size was normal. 5. The mitral valve is normal in structure. Mild mitral valve regurgitation. 6. The tricuspid valve is normal in structure. Tricuspid valve regurgitation is mild. 7. The aortic valve is normal in structure. Aortic valve regurgitation is not visualized. 8. The pulmonic valve was grossly normal. Pulmonic valve regurgitation is trivial. 9. Mildly elevated pulmonary artery systolic pressure.  Patient Profile     85 y.o. male with PMH of CAD s/p remote PCI/DES, PVD s/p aortobifemoral bypass and bilateral superficial femoral artery occlusion, HTN, and HLD, who presented to [facility] with unstable angina where he was found to have severe multivessel CAD and transferred to [facility] for CABG evaluation. TCTS saw this morning and is planning CABG on Friday after brief plavix washout.  Assessment & Plan    1. Severe multivessel CAD 2. Unstable angina - CTS consulted and is planning CABG for Friday after brief plavix washout - he is chest pain free today - continue heparin gtt, ASA, BB, statin   3. Hypertension - pressures well-controlled today after adding amlodipine - continue BB   4. Hyperlipidemia  - 09/11/2019: Cholesterol 134; HDL 32; LDL Cholesterol 92; Triglycerides 49; VLDL 10 - crestor was increased to 20 mg - will need repeat labs in 6  weeks   5. Acute on chronic kidney disease stage 3 - sCr today is 1.33 - baseline sCr 1.4   6. Anemia 7. Thrombocytopenia - Hb 10.6 - suspect anemia of chronic disease - Hb baseline difficult to determine, no active bleeding - PLT 148 - stable     For questions or updates, please contact [place] Please consult [email or web address] for contact info under        Signed, Ledora Bottcher, PA  09/14/2019, 7:43 AM

## 2019-09-14 NOTE — Discharge Summary (Addendum)
Physician Discharge Summary  Brandon Lewis [id number] DOB: 1934-01-10 DOA: 09/11/2019  PCP: Venia Carbon, MD  Admit date: 09/11/2019 Discharge date: 09/13/2019   Admitted From: Home  Disposition:  [facility], cardiology service  Recommendations for Outpatient Follow-up:  1. Per d/c service at [facility]  Discharge Condition: Stable CODE STATUS: Full Diet recommendation: Heart Healthy  Brief/Interim Summary:  85 y.o.malewith a history of coronary artery disease status post PCI and stent, hypertension, dyslipidemia and peripheral vascular disease, presented to the ED with acute onset ofmidsternal chest pain, described as pressure, burning, and graded 7/10 in severity with associated diaphoresis. Had recurrence of pain while he was in the ED.  In the ED, vitals normal aside from mild hypertension.  High-sensitivity troponin initally 17, repeat 69.  ECG showed normal sinus rhythm, HR 64 with anterolateral T wave inversions.  Started on heparin and admitted to telemetry for cardiology evaluation and further management.  Patient taken to cath lab on 11/22, found to have multi-vessel coronary disease, no PCI was performed.  To proceed with CABG at [facility] on Cardiology service.  Plavix on hold.   Discharge Diagnoses: Principal Problem:   NSTEMI (non-ST elevated myocardial infarction) [facility]) Active Problems:   Essential hypertension, benign   Hyperlipemia   Allergic rhinitis    Discharge Instructions   Discharge Instructions    AMB Referral to Cardiac Rehabilitation - Phase II   Complete by: As directed    Diagnosis: NSTEMI     Allergies as of 09/12/2019      Reactions   Cilostazol Other (See Comments)   REACTION: stomach problems- constipation   Simvastatin Other (See Comments)   REACTION: myalgia      Medication List    ASK your doctor about these medications   cholecalciferol 1000 units tablet Commonly known as: VITAMIN D Take 1,000 Units by mouth at  bedtime.   clopidogrel 75 MG tablet Commonly known as: PLAVIX TAKE ONE TABLET BY MOUTH EVERY DAY *BOTTLE*   gentamicin cream 0.1 % Commonly known as: GARAMYCIN Apply 1 application topically 2 (two) times daily.   losartan 100 MG tablet Commonly known as: COZAAR TAKE ONE TABLET BY MOUTH EACH MORNING FOR BLOOD PRESSURE CONTROL. *BOTTLE* *BOTTLE*   metoprolol succinate 50 MG 24 hr tablet Commonly known as: TOPROL-XL **VIAL ONLY** TAKE (1) TABLET BY MOUTH ONCE A DAY.DO NOT CRUSH. (HIGHBLOOD PRESSURE) *BOTTLE*   rosuvastatin 10 MG tablet Commonly known as: CRESTOR **VIAL ONLY** TAKE ONE TABLET BY MOUTH AT BEDTIME. (IMPROVES CHOLESTEROL)   sildenafil 20 MG tablet Commonly known as: REVATIO TAKE 3-5 TABLETS BY MOUTH DAILY AS NEEDED       Allergies  Allergen Reactions  . Cilostazol Other (See Comments)    REACTION: stomach problems- constipation  . Simvastatin Other (See Comments)    REACTION: myalgia    Consultations:  Cardiology   Procedures/Studies: Dg Chest 2 View  Result Date: 09/11/2019 CLINICAL DATA:  Chest pain EXAM: CHEST - 2 VIEW COMPARISON:  None. FINDINGS: No consolidation or effusion. Scarring or atelectasis at the lingula. Normal heart size. No pneumothorax. Chronic appearing wedging deformities at the thoracolumbar junction. IMPRESSION: No active cardiopulmonary disease. Scarring or atelectasis at the lingula. Electronically Signed   By: Donavan Foil M.D.   On: 09/11/2019 02:23   Ct Head Wo Contrast  Result Date: 09/14/2019 CLINICAL DATA:  Dementia, preoperative EXAM: CT HEAD WITHOUT CONTRAST TECHNIQUE: Contiguous axial images were obtained from the base of the skull through the vertex without intravenous contrast.  COMPARISON:  02/05/2008 FINDINGS: Brain: No evidence of acute infarction, hemorrhage, hydrocephalus, extra-axial collection or mass lesion/mass effect. Periventricular white matter hypodensity. Mild global volume loss, which has progressed  compared to examination dated 2009. Vascular: No hyperdense vessel or unexpected calcification. Skull: Normal. Negative for fracture or focal lesion. Sinuses/Orbits: No acute finding. Other: None. IMPRESSION: No acute intracranial pathology. Small-vessel white matter disease and mild global volume loss, which has progressed compared to examination dated 2009. Electronically Signed   By: Eddie Candle M.D.   On: 09/14/2019 09:12   Ct Chest Wo Contrast  Result Date: 09/14/2019 CLINICAL DATA:  Follow-up thoracic aortic aneurysm. EXAM: CT CHEST WITHOUT CONTRAST TECHNIQUE: Multidetector CT imaging of the chest was performed following the standard protocol without IV contrast. COMPARISON:  None. FINDINGS: Cardiovascular: Ascending thoracic aortic aneurysm is seen measuring 4.1 cm. No evidence of mediastinal hematoma or pericardial effusion. Aortic and coronary artery atherosclerosis. Mediastinum/Nodes: 2.2 cm low-attenuation right thyroid lobe nodule is seen. No pathologically enlarged lymph nodes identified on this unenhanced exam. Lungs/Pleura: No pulmonary infiltrate or mass identified. No pleural effusion present. Mild bilateral pleural-parenchymal scarring noted. Upper Abdomen: A few small fluid attenuation hepatic cysts are noted. Musculoskeletal:  No suspicious bone lesions. IMPRESSION: 1. 4.1 cm ascending thoracic aortic aneurysm. Recommend annual imaging followup by CTA or MRA. This recommendation follows 2010 ACCF/AHA/AATS/ACR/ASA/SCA/SCAI/SIR/STS/SVM Guidelines for the Diagnosis and Management of Patients with Thoracic Aortic Disease. Circulation. 2010; [id number]. Aortic aneurysm NOS (ICD10-I71.9) 2. 2.2 cm low-attenuation right thyroid lobe. Thyroid ultrasound should be considered for further evaluation (in absence of significant comorbidities or limited life expectancy). This follows ACR consensus guidelines: Managing Incidental Thyroid Nodules Detected on Imaging: White Paper of the ACR Incidental  Thyroid Findings Committee. J Am Coll Radiol 2015; 12:143-150. Aortic Atherosclerosis (ICD10-I70.0). Electronically Signed   By: Marlaine Hind M.D.   On: 09/14/2019 09:39      Left Heart Cath, no PCI, multi-vessel disease, for CABG   Subjective: No acute events reported.   Discharge Exam: Vitals:   09/12/19 1627 09/12/19 1724  BP: (!) 193/96 (!) 174/79  Pulse: 87 89  Resp: 18 16  Temp: 97.6 F (36.4 C) 98.8 F (37.1 C)  SpO2: 98% 99%   Vitals:   09/12/19 1050 09/12/19 1528 09/12/19 1627 09/12/19 1724  BP:  127/76 (!) 193/96 (!) 174/79  Pulse: 77 85 87 89  Resp: 17 19 18 16   Temp:  98.3 F (36.8 C) 97.6 F (36.4 C) 98.8 F (37.1 C)  TempSrc:  Oral  Oral  SpO2: 99% 99% 98% 99%  Weight:      Height:        Physical Exam as per cardiology progress note   The results of significant diagnostics from this hospitalization (including imaging, microbiology, ancillary and laboratory) are listed below for reference.     Microbiology: Recent Results (from the past 240 hour(s))  SARS CORONAVIRUS 2 (TAT 6-24 HRS) Nasopharyngeal Nasopharyngeal Swab     Status: None   Collection Time: 09/11/19  5:14 AM   Specimen: Nasopharyngeal Swab  Result Value Ref Range Status   SARS Coronavirus 2 NEGATIVE NEGATIVE Final    Comment: (NOTE) SARS-CoV-2 target nucleic acids are NOT DETECTED. The SARS-CoV-2 RNA is generally detectable in upper and lower respiratory specimens during the acute phase of infection. Negative results do not preclude SARS-CoV-2 infection, do not rule out co-infections with other pathogens, and should not be used as the sole basis for treatment or other patient management decisions. Negative  results must be combined with clinical observations, patient history, and epidemiological information. The expected result is Negative. Fact Sheet for Patients: SugarRoll.be Fact Sheet for Healthcare  Providers: https://www.woods-mathews.com/ This test is not yet approved or cleared by the [place] FDA and  has been authorized for detection and/or diagnosis of SARS-CoV-2 by FDA under an Emergency Use Authorization (EUA). This EUA will remain  in effect (meaning this test can be used) for the duration of the COVID-19 declaration under Section 56 4(b)(1) of the Act, 21 U.S.C. section 360bbb-3(b)(1), unless the authorization is terminated or revoked sooner. Performed at [facility], [place] [address], [place], [place] [address]      Labs: BNP (last 3 results) No results for input(s): BNP in the last 8760 hours. Basic Metabolic Panel: Recent Labs  Lab 09/11/19 0139 09/12/19 1951 09/13/19 0311  NA 141 138 139  K 4.2 5.3* 5.0  CL 108 111 113*  CO2 24 21* 20*  GLUCOSE 201* 162* 126*  BUN 28* 24* 22  CREATININE 1.64* 1.40* 1.33*  CALCIUM 9.5 9.6 9.3   Liver Function Tests: Recent Labs  Lab 09/11/19 0139  AST 21  ALT 15  ALKPHOS 67  BILITOT 0.6  PROT 7.2  ALBUMIN 3.4*   No results for input(s): LIPASE, AMYLASE in the last 168 hours. No results for input(s): AMMONIA in the last 168 hours. CBC: Recent Labs  Lab 09/11/19 0139 09/13/19 0311 09/14/19 0636  WBC 8.3 9.8 10.2  HGB 9.6* 10.4* 10.6*  HCT 30.4* 32.4* 33.2*  MCV 119.2* 117.4* 119.9*  PLT 148* 148* 148*   Cardiac Enzymes: No results for input(s): CKTOTAL, CKMB, CKMBINDEX, TROPONINI in the last 168 hours. BNP: Invalid input(s): POCBNP CBG: No results for input(s): GLUCAP in the last 168 hours. D-Dimer No results for input(s): DDIMER in the last 72 hours. Hgb A1c No results for input(s): HGBA1C in the last 72 hours. Lipid Profile No results for input(s): CHOL, HDL, LDLCALC, TRIG, CHOLHDL, LDLDIRECT in the last 72 hours. Thyroid function studies No results for input(s): TSH, T4TOTAL, T3FREE, THYROIDAB in the last 72 hours.  Invalid input(s): FREET3 Anemia work up Recent Labs     09/13/19 0311  VITAMINB12 384  FOLATE 28.8  FERRITIN 135  TIBC 244*  IRON 60  RETICCTPCT 2.2   Urinalysis No results found for: COLORURINE, APPEARANCEUR, LABSPEC, [place], GLUCOSEU, HGBUR, BILIRUBINUR, KETONESUR, PROTEINUR, UROBILINOGEN, NITRITE, LEUKOCYTESUR Sepsis Labs Invalid input(s): PROCALCITONIN,  WBC,  LACTICIDVEN Microbiology Recent Results (from the past 240 hour(s))  SARS CORONAVIRUS 2 (TAT 6-24 HRS) Nasopharyngeal Nasopharyngeal Swab     Status: None   Collection Time: 09/11/19  5:14 AM   Specimen: Nasopharyngeal Swab  Result Value Ref Range Status   SARS Coronavirus 2 NEGATIVE NEGATIVE Final    Comment: (NOTE) SARS-CoV-2 target nucleic acids are NOT DETECTED. The SARS-CoV-2 RNA is generally detectable in upper and lower respiratory specimens during the acute phase of infection. Negative results do not preclude SARS-CoV-2 infection, do not rule out co-infections with other pathogens, and should not be used as the sole basis for treatment or other patient management decisions. Negative results must be combined with clinical observations, patient history, and epidemiological information. The expected result is Negative. Fact Sheet for Patients: SugarRoll.be Fact Sheet for Healthcare Providers: https://www.woods-mathews.com/ This test is not yet approved or cleared by the [place] FDA and  has been authorized for detection and/or diagnosis of SARS-CoV-2 by FDA under an Emergency Use Authorization (EUA). This EUA will remain  in effect (meaning this test can be used) for the duration of the COVID-19 declaration under Section 56 4(b)(1) of the Act, 21 U.S.C. section 360bbb-3(b)(1), unless the authorization is terminated or revoked sooner. Performed at [facility], [place] [address], [place], [place] [address]      Time coordinating discharge: Over 30 minutes  SIGNED:   Ezekiel Slocumb, DO [facility] 09/14/2019, 12:30 PM Pager [phone]  If 7PM-7AM, please contact night-coverage [email or web address] Password [email or web address]

## 2019-09-15 LAB — CBC
HCT: 33.9 % — ABNORMAL LOW (ref 39.0–52.0)
Hemoglobin: 10.9 g/dL — ABNORMAL LOW (ref 13.0–17.0)
MCH: 38.1 pg — ABNORMAL HIGH (ref 26.0–34.0)
MCHC: 32.2 g/dL (ref 30.0–36.0)
MCV: 118.5 fL — ABNORMAL HIGH (ref 80.0–100.0)
Platelets: 157 10*3/uL (ref 150–400)
RBC: 2.86 MIL/uL — ABNORMAL LOW (ref 4.22–5.81)
RDW: 16.6 % — ABNORMAL HIGH (ref 11.5–15.5)
WBC: 10.7 10*3/uL — ABNORMAL HIGH (ref 4.0–10.5)
nRBC: 0 % (ref 0.0–0.2)

## 2019-09-15 LAB — BLOOD GAS, ARTERIAL
Acid-base deficit: 4.1 mmol/L — ABNORMAL HIGH (ref 0.0–2.0)
Bicarbonate: 20.2 mmol/L (ref 20.0–28.0)
FIO2: 21
O2 Saturation: 96.8 %
Patient temperature: 36.8
pCO2 arterial: 34.7 mmHg (ref 32.0–48.0)
pH, Arterial: 7.382 (ref 7.350–7.450)
pO2, Arterial: 86.4 mmHg (ref 83.0–108.0)

## 2019-09-15 LAB — HEMOGLOBIN A1C
Hgb A1c MFr Bld: 6.5 % — ABNORMAL HIGH (ref 4.8–5.6)
Mean Plasma Glucose: 139.85 mg/dL

## 2019-09-15 LAB — URINALYSIS, ROUTINE W REFLEX MICROSCOPIC
Bilirubin Urine: NEGATIVE
Glucose, UA: NEGATIVE mg/dL
Hgb urine dipstick: NEGATIVE
Ketones, ur: NEGATIVE mg/dL
Leukocytes,Ua: NEGATIVE
Nitrite: NEGATIVE
Protein, ur: NEGATIVE mg/dL
Specific Gravity, Urine: 1.016 (ref 1.005–1.030)
pH: 5 (ref 5.0–8.0)

## 2019-09-15 LAB — APTT: aPTT: 129 seconds — ABNORMAL HIGH (ref 24–36)

## 2019-09-15 LAB — SURGICAL PCR SCREEN
MRSA, PCR: NEGATIVE
Staphylococcus aureus: NEGATIVE

## 2019-09-15 LAB — HEPARIN LEVEL (UNFRACTIONATED): Heparin Unfractionated: 0.51 IU/mL (ref 0.30–0.70)

## 2019-09-15 LAB — ABO/RH: ABO/RH(D): O POS

## 2019-09-15 MED ORDER — CHLORHEXIDINE GLUCONATE 0.12 % MT SOLN
15.0000 mL | Freq: Once | OROMUCOSAL | Status: AC
  Administered 2019-09-16: 15 mL via OROMUCOSAL
  Filled 2019-09-15: qty 15

## 2019-09-15 MED ORDER — BISACODYL 5 MG PO TBEC: 5.0000 mg | DELAYED_RELEASE_TABLET | Freq: Once | ORAL | Status: DC

## 2019-09-15 MED ORDER — TEMAZEPAM 15 MG PO CAPS: 15.0000 mg | ORAL_CAPSULE | Freq: Once | ORAL | Status: DC | PRN

## 2019-09-15 MED ORDER — CHLORHEXIDINE GLUCONATE CLOTH 2 % EX PADS
6.0000 | MEDICATED_PAD | Freq: Once | CUTANEOUS | Status: AC
  Administered 2019-09-16: 6 via TOPICAL

## 2019-09-15 MED ORDER — CHLORHEXIDINE GLUCONATE CLOTH 2 % EX PADS
6.0000 | MEDICATED_PAD | Freq: Once | CUTANEOUS | Status: AC
  Administered 2019-09-15: 6 via TOPICAL

## 2019-09-15 MED ORDER — METOPROLOL TARTRATE 12.5 MG HALF TABLET
12.5000 mg | ORAL_TABLET | Freq: Once | ORAL | Status: AC
  Administered 2019-09-16: 12.5 mg via ORAL
  Filled 2019-09-15: qty 1

## 2019-09-15 NOTE — Progress Notes (Signed)
Progress Note  Patient Name: Brandon Lewis Date of Encounter: 09/15/2019  Primary Cardiologist: Corey Skains, MD   Subjective   Denies any CP or SOB  Inpatient Medications    Scheduled Meds: . amLODipine  5 mg Oral Daily  . aspirin EC  81 mg Oral Daily  . [START ON 09/16/2019] epinephrine  0-10 mcg/min Intravenous To OR  . [START ON 09/16/2019] heparin-papaverine-plasmalyte irrigation   Irrigation To OR  . [START ON 09/16/2019] insulin   Intravenous To OR  . [START ON 09/16/2019] magnesium sulfate  40 mEq Other To OR  . metoprolol succinate  50 mg Oral Daily  . multivitamins with iron  1 tablet Oral Daily  . [START ON 09/16/2019] phenylephrine  30-200 mcg/min Intravenous To OR  . pneumococcal 23 valent vaccine  0.5 mL Intramuscular Once  . [START ON 09/16/2019] potassium chloride  80 mEq Other To OR  . rosuvastatin  20 mg Oral q1800  . [START ON 09/16/2019] tranexamic acid  15 mg/kg Intravenous To OR  . [START ON 09/16/2019] tranexamic acid  2 mg/kg Intracatheter To OR   Continuous Infusions: . [START ON 09/16/2019] cefUROXime (ZINACEF)  IV    . [START ON 09/16/2019] cefUROXime (ZINACEF)  IV    . [START ON 09/16/2019] dexmedetomidine    . [START ON 09/16/2019] DOPamine    . [START ON 09/16/2019] heparin 30,000 units/NS 1000 mL solution for CELLSAVER    . heparin 1,350 Units/hr (09/15/19 0533)  . [START ON 09/16/2019] milrinone    . [START ON 09/16/2019] nitroGLYCERIN    . [START ON 09/16/2019] norepinephrine (LEVOPHED) Adult infusion    . [START ON 09/16/2019] tranexamic acid (CYKLOKAPRON) infusion ([facility])    . [START ON 09/16/2019] vancomycin     PRN Meds: acetaminophen, nitroGLYCERIN, ondansetron (ZOFRAN) IV   Vital Signs    Vitals:   09/14/19 2100 09/15/19 0327 09/15/19 0500 09/15/19 0805  BP:  124/67  138/85  Pulse:  65  78  Resp:  20    Temp:  98.6 F (37 C)    TempSrc:  Oral    SpO2:  97%    Weight: 87.5 kg  87.5 kg   Height:         Intake/Output Summary (Last 24 hours) at 09/15/2019 0916 Last data filed at 09/15/2019 0800 Gross per 24 hour  Intake 1280 ml  Output 1370 ml  Net -90 ml   Last 3 Weights 09/15/2019 09/14/2019 09/14/2019  Weight (lbs) 192 lb 14.4 oz 192 lb 14.4 oz 193 lb  Weight (kg) 87.499 kg 87.499 kg 87.544 kg      Telemetry    NSR - Personally Reviewed  ECG   No new EKG to review  Physical Exam   GEN: Well nourished, well developed in no acute distress HEENT: Normal NECK: No JVD; No carotid bruits LYMPHATICS: No lymphadenopathy CARDIAC:RRR, no murmurs, rubs, gallops RESPIRATORY:  Clear to auscultation without rales, wheezing or rhonchi  ABDOMEN: Soft, non-tender, non-distended MUSCULOSKELETAL:  No edema; No deformity  SKIN: Warm and dry NEUROLOGIC:  Alert and oriented x 3 PSYCHIATRIC:  Normal affect   Labs    High Sensitivity Troponin:   Recent Labs  Lab 09/11/19 0139 09/11/19 0334 09/11/19 0634 09/11/19 0806  TROPONINIHS 17 69* 81* 74*      Chemistry Recent Labs  Lab 09/11/19 0139 09/12/19 1951 09/13/19 0311  NA 141 138 139  K 4.2 5.3* 5.0  CL 108 111 113*  CO2 24 21* 20*  GLUCOSE 201* 162* 126*  BUN 28* 24* 22  CREATININE 1.64* 1.40* 1.33*  CALCIUM 9.5 9.6 9.3  PROT 7.2  --   --   ALBUMIN 3.4*  --   --   AST 21  --   --   ALT 15  --   --   ALKPHOS 67  --   --   BILITOT 0.6  --   --   GFRNONAA 38* 46* 49*  GFRAA 44* 53* 57*  ANIONGAP 9 6 6      Hematology Recent Labs  Lab 09/13/19 0311 09/14/19 0636 09/15/19 0243  WBC 9.8 10.2 10.7*  RBC 2.76*  2.76* 2.77* 2.86*  HGB 10.4* 10.6* 10.9*  HCT 32.4* 33.2* 33.9*  MCV 117.4* 119.9* 118.5*  MCH 37.7* 38.3* 38.1*  MCHC 32.1 31.9 32.2  RDW 16.7* 16.6* 16.6*  PLT 148* 148* 157    BNPNo results for input(s): BNP, PROBNP in the last 168 hours.   DDimer No results for input(s): DDIMER in the last 168 hours.   Radiology    Ct Head Wo Contrast  Result Date: 09/14/2019 CLINICAL DATA:  Dementia,  preoperative EXAM: CT HEAD WITHOUT CONTRAST TECHNIQUE: Contiguous axial images were obtained from the base of the skull through the vertex without intravenous contrast. COMPARISON:  02/05/2008 FINDINGS: Brain: No evidence of acute infarction, hemorrhage, hydrocephalus, extra-axial collection or mass lesion/mass effect. Periventricular white matter hypodensity. Mild global volume loss, which has progressed compared to examination dated 2009. Vascular: No hyperdense vessel or unexpected calcification. Skull: Normal. Negative for fracture or focal lesion. Sinuses/Orbits: No acute finding. Other: None. IMPRESSION: No acute intracranial pathology. Small-vessel white matter disease and mild global volume loss, which has progressed compared to examination dated 2009. Electronically Signed   By: Eddie Candle M.D.   On: 09/14/2019 09:12   Ct Chest Wo Contrast  Result Date: 09/14/2019 CLINICAL DATA:  Follow-up thoracic aortic aneurysm. EXAM: CT CHEST WITHOUT CONTRAST TECHNIQUE: Multidetector CT imaging of the chest was performed following the standard protocol without IV contrast. COMPARISON:  None. FINDINGS: Cardiovascular: Ascending thoracic aortic aneurysm is seen measuring 4.1 cm. No evidence of mediastinal hematoma or pericardial effusion. Aortic and coronary artery atherosclerosis. Mediastinum/Nodes: 2.2 cm low-attenuation right thyroid lobe nodule is seen. No pathologically enlarged lymph nodes identified on this unenhanced exam. Lungs/Pleura: No pulmonary infiltrate or mass identified. No pleural effusion present. Mild bilateral pleural-parenchymal scarring noted. Upper Abdomen: A few small fluid attenuation hepatic cysts are noted. Musculoskeletal:  No suspicious bone lesions. IMPRESSION: 1. 4.1 cm ascending thoracic aortic aneurysm. Recommend annual imaging followup by CTA or MRA. This recommendation follows 2010 ACCF/AHA/AATS/ACR/ASA/SCA/SCAI/SIR/STS/SVM Guidelines for the Diagnosis and Management of Patients  with Thoracic Aortic Disease. Circulation. 2010; [id number]. Aortic aneurysm NOS (ICD10-I71.9) 2. 2.2 cm low-attenuation right thyroid lobe. Thyroid ultrasound should be considered for further evaluation (in absence of significant comorbidities or limited life expectancy). This follows ACR consensus guidelines: Managing Incidental Thyroid Nodules Detected on Imaging: White Paper of the ACR Incidental Thyroid Findings Committee. J Am Coll Radiol 2015; 12:143-150. Aortic Atherosclerosis (ICD10-I70.0). Electronically Signed   By: Marlaine Hind M.D.   On: 09/14/2019 09:39   Vas US Doppler Pre Cabg  Result Date: 09/15/2019 PREOPERATIVE VASCULAR EVALUATION  Indications:            Pre op. Risk Factors:           Hypertension, PAD. Vascular Interventions: Hx Fem Fem bypass. Performing Technologist: June Leap Rvt, Rdms  Examination Guidelines: A complete evaluation  includes B-mode imaging, spectral Doppler, color Doppler, and power Doppler as needed of all accessible portions of each vessel. Bilateral testing is considered an integral part of a complete examination. Limited examinations for reoccurring indications may be performed as noted.  Right Carotid Findings: +----------+--------+--------+--------+------------+--------+           PSV cm/sEDV cm/sStenosisDescribe    Comments +----------+--------+--------+--------+------------+--------+ CCA Prox  93      16                                   +----------+--------+--------+--------+------------+--------+ CCA Distal92      20                                   +----------+--------+--------+--------+------------+--------+ ICA Prox  103     23      1-39%   heterogenous         +----------+--------+--------+--------+------------+--------+ ICA Distal92      17                                   +----------+--------+--------+--------+------------+--------+ Portions of this table do not appear on this page.  +----------+--------+-------+----------------+------------+           PSV cm/sEDV cmsDescribe        Arm Pressure +----------+--------+-------+----------------+------------+ Subclavian148     9      Multiphasic, [id number]          +----------+--------+-------+----------------+------------+ +---------+--------+--+--------+--+---------+ VertebralPSV cm/s56EDV cm/s12Antegrade +---------+--------+--+--------+--+---------+ Left Carotid Findings: +----------+--------+--------+--------+------------+--------+           PSV cm/sEDV cm/sStenosisDescribe    Comments +----------+--------+--------+--------+------------+--------+ CCA Prox  112     24                                   +----------+--------+--------+--------+------------+--------+ CCA Distal76      18                                   +----------+--------+--------+--------+------------+--------+ ICA Prox  100     17      1-39%   heterogenous         +----------+--------+--------+--------+------------+--------+ ICA Distal96      17                                   +----------+--------+--------+--------+------------+--------+ ECA       112     19                                   +----------+--------+--------+--------+------------+--------+ +----------+--------+--------+----------------+------------+ SubclavianPSV cm/sEDV cm/sDescribe        Arm Pressure +----------+--------+--------+----------------+------------+           88      22      Multiphasic, [id number]          +----------+--------+--------+----------------+------------+ +---------+--------+--+--------+--+---------+ VertebralPSV cm/s36EDV cm/s14Antegrade +---------+--------+--+--------+--+---------+ Incidental finding: bilateral thyroid nodules with atypical appearance. ABI Findings: +--------+------------------+-----+----------+--------+ Right   Rt Pressure (mmHg)IndexWaveform  Comment   +--------+------------------+-----+----------+--------+ [id number]  triphasic          +--------+------------------+-----+----------+--------+ ATA     78                0.60 monophasic         +--------+------------------+-----+----------+--------+ PTA     79                0.60 monophasic         +--------+------------------+-----+----------+--------+ +--------+------------------+-----+----------+-------+ Left    Lt Pressure (mmHg)IndexWaveform  Comment +--------+------------------+-----+----------+-------+ [id number]                    triphasic         +--------+------------------+-----+----------+-------+ ATA     83                0.63 monophasic        +--------+------------------+-----+----------+-------+ PTA     80                0.61 monophasic        +--------+------------------+-----+----------+-------+  Right Doppler Findings: +--------+--------+-----+---------+--------+ Site    PressureIndexDoppler  Comments +--------+--------+-----+---------+--------+ [id number]          triphasic         +--------+--------+-----+---------+--------+ Radial               triphasic         +--------+--------+-----+---------+--------+ Ulnar                triphasic         +--------+--------+-----+---------+--------+  Left Doppler Findings: +--------+--------+-----+---------+--------+ Site    PressureIndexDoppler  Comments +--------+--------+-----+---------+--------+ [id number]          triphasic         +--------+--------+-----+---------+--------+ Radial               triphasic         +--------+--------+-----+---------+--------+ Ulnar                triphasic         +--------+--------+-----+---------+--------+  Summary: Right Carotid: Velocities in the right ICA are consistent with a 1-39% stenosis. Left Carotid: Velocities in the left ICA are consistent with a 1-39% stenosis. Right ABI: Resting right  ankle-brachial index indicates moderate right lower extremity arterial disease. Left ABI: Resting left ankle-brachial index indicates moderate left lower extremity arterial disease.  Bilateral Extremity: Doppler waveforms remain within normal limits with compression bilaterally for the radial arteries. Doppler waveforms remain within normal limits with compression bilaterally for the ulnar arteries.  Electronically signed by Curt Jews MD on 09/15/2019 at 8:47:21 AM.    Final     Cardiac Studies   Left heart catheterization 09/12/2019:  Mid LM to Dist LM lesion is 85% stenosed.  Prox Cx lesion is 50% stenosed.  Prox LAD to Mid LAD lesion is 85% stenosed.  Prox LAD lesion is 70% stenosed.  Mid LAD lesion is 45% stenosed.  Prox RCA to Mid RCA lesion is 70% stenosed.  85 year old male with known cardiovascular disease status post previous stent placement without evidence of previous myocardial infarction and severe peripheral vascular disease with bifemoral bypass who has had appropriate medication management having now unstable angina and non-ST elevation myocardial infarction.  Echocardiogram and LV angiogram showing normal LV systolic function with ejection fraction of 55 to 60%  Right coronary artery with proximal 70% stenosis Distal left main with 85% stenosis Proximal left anterior descending artery with 70% 60% and [id number] stenosis complicated Mid LAD stenosis of 45% Proximal circumflex  stenosis of 50%  Plan Discussion of coronary artery bypass surgery due to severe left main LAD stenosis with right coronary artery stenosis Continue antiplatelet therapy but discontinuation of Plavix at this time for preparation for surgery High intensity cholesterol therapy Continuation of beta-blocker ACE inhibitor for myocardial infarction Cardiac rehabilitation after above  Echocardiogram 09/11/2019: 1. Left ventricular ejection fraction, by visual estimation, is 50 to 55%. The left  ventricle has normal function. There is no left ventricular hypertrophy. 2. Global right ventricle has normal systolic function.The right ventricular size is normal. No increase in right ventricular wall thickness. 3. Left atrial size was normal. 4. Right atrial size was normal. 5. The mitral valve is normal in structure. Mild mitral valve regurgitation. 6. The tricuspid valve is normal in structure. Tricuspid valve regurgitation is mild. 7. The aortic valve is normal in structure. Aortic valve regurgitation is not visualized. 8. The pulmonic valve was grossly normal. Pulmonic valve regurgitation is trivial. 9. Mildly elevated pulmonary artery systolic pressure.  Patient Profile     85 y.o. male with PMH of CAD s/p remote PCI/DES, PVD s/p aortobifemoral bypass and bilateral superficial femoral artery occlusion, HTN, and HLD, who presented to [facility] with unstable angina where he was found to have severe multivessel CAD and transferred to [facility] for CABG evaluation. TCTS saw this morning and is planning CABG on Friday after brief plavix washout.  Assessment & Plan    1. Severe multivessel CAD -s/p remote PCI -s/p cath this admit showing severe 3v CAD -continue high dose statin, ASA, BB  2. Unstable angina - CTS consulted and is planning CABG for Friday after brief plavix washout - denies any CP - continue ASA, statin and BB - Plavix stopped for CABG  3. Hypertension - BP controlled - continue BB and amlodipine  4. Hyperlipidemia  - 09/11/2019: Cholesterol 134; HDL 32; LDL Cholesterol 92; Triglycerides 49; VLDL 10 - crestor was increased to 20 mg - will need repeat labs in 6 weeks  5. Acute on chronic kidney disease stage 3 - sCr 1.33 on 11/26 - baseline sCr 1.4 - repeat BMET  6. Anemia -Hbg stabel at 10.9  7. Thrombocytopenia - PLT 157 - stable   For questions or updates, please contact [place] Please consult [email or web address] for contact info under         Signed, Fransico Him, MD  09/15/2019, 9:16 AM

## 2019-09-15 NOTE — Progress Notes (Signed)
ANTICOAGULATION CONSULT NOTE - Initial Consult  Pharmacy Consult for heparin Indication: chest pain/ACS  Patient Measurements: Height: 5\' 10"  (177.8 cm) Weight: 192 lb 14.4 oz (87.5 kg) IBW/kg (Calculated) : 73 Heparin Dosing Weight: 87.5kg  Vital Signs: Temp: 98.6 F (37 C) (11/26 0327) Temp Source: Oral (11/26 0327) BP: 124/67 (11/26 0327) Pulse Rate: 65 (11/26 0327)  Labs: Recent Labs    09/12/19 1951  09/13/19 0311  09/14/19 0415 09/14/19 0636 09/14/19 1255 09/14/19 1402 09/15/19 0243  HGB  --    < > 10.4*  --   --  10.6*  --   --  10.9*  HCT  --   --  32.4*  --   --  33.2*  --   --  33.9*  PLT  --   --  148*  --   --  148*  --   --  157  LABPROT  --   --   --   --   --   --  13.7  --   --   INR  --   --   --   --   --   --  1.1  --   --   HEPARINUNFRC  --   --  0.29*   < > 0.41  --   --  0.55 0.51  CREATININE 1.40*  --  1.33*  --   --   --   --   --   --    < > = values in this interval not displayed.    Estimated Creatinine Clearance: 43.5 mL/min (A) (by C-G formula based on SCr of 1.33 mg/dL (H)).  Assessment: 89 YOM presenting with [facility] and started on heparin gtt therapeutic at 1150 units/hr 11/22 however was not transported with gtt. Admitted here for possible CABG, to continue heparin here.    Today HL therapeutic @ 0.51, planned for CABG 11/27. H/H & plt stable with hgb 10.9 hct 33.9 and platelets at 157.   Goal of Therapy:  Heparin level 0.3-0.7 units/ml Monitor platelets by anticoagulation protocol: Yes   Plan:  Continue heparin 1350 units/hr Planned CABG 11/27  Monitor daily HL, CBC, plt Monitor for signs/symptoms of bleeding    Thank you for the interesting consult and for involving pharmacy in this patient's care.  Tamela Gammon, PharmD, BCPS 09/15/2019 7:11 AM Please check AMION for all [place] phone numbers After 10:00 PM, call [place] [phone]

## 2019-09-16 ENCOUNTER — Inpatient Hospital Stay (HOSPITAL_COMMUNITY): Payer: Medicare Other | Admitting: Certified Registered Nurse Anesthetist

## 2019-09-16 ENCOUNTER — Encounter (HOSPITAL_COMMUNITY): Payer: Self-pay | Admitting: Anesthesiology

## 2019-09-16 ENCOUNTER — Inpatient Hospital Stay (HOSPITAL_COMMUNITY): Payer: Medicare Other

## 2019-09-16 ENCOUNTER — Inpatient Hospital Stay (HOSPITAL_COMMUNITY)
Admission: AD | Disposition: A | Discharge status: Home Health | Payer: Self-pay | Source: Other Acute Inpatient Hospital | Attending: Cardiothoracic Surgery

## 2019-09-16 DIAGNOSIS — I214 Non-ST elevation (NSTEMI) myocardial infarction: Secondary | ICD-10-CM

## 2019-09-16 DIAGNOSIS — I739 Peripheral vascular disease, unspecified: Secondary | ICD-10-CM

## 2019-09-16 DIAGNOSIS — Z951 Presence of aortocoronary bypass graft: Secondary | ICD-10-CM

## 2019-09-16 DIAGNOSIS — I2510 Atherosclerotic heart disease of native coronary artery without angina pectoris: Secondary | ICD-10-CM | POA: Diagnosis present

## 2019-09-16 DIAGNOSIS — I25110 Atherosclerotic heart disease of native coronary artery with unstable angina pectoris: Secondary | ICD-10-CM

## 2019-09-16 HISTORY — DX: Presence of aortocoronary bypass graft: Z95.1

## 2019-09-16 HISTORY — PX: TEE WITHOUT CARDIOVERSION: SHX5443

## 2019-09-16 HISTORY — PX: CORONARY ARTERY BYPASS GRAFT: SHX141

## 2019-09-16 LAB — POCT I-STAT, CHEM 8
BUN: 33 mg/dL — ABNORMAL HIGH (ref 8–23)
BUN: 34 mg/dL — ABNORMAL HIGH (ref 8–23)
BUN: 33 mg/dL — ABNORMAL HIGH (ref 8–23)
BUN: 35 mg/dL — ABNORMAL HIGH (ref 8–23)
BUN: 39 mg/dL — ABNORMAL HIGH (ref 8–23)
Calcium, Ion: 1.39 mmol/L (ref 1.15–1.40)
Calcium, Ion: 1.36 mmol/L (ref 1.15–1.40)
Calcium, Ion: 1.12 mmol/L — ABNORMAL LOW (ref 1.15–1.40)
Calcium, Ion: 1.12 mmol/L — ABNORMAL LOW (ref 1.15–1.40)
Calcium, Ion: 1.34 mmol/L (ref 1.15–1.40)
Chloride: 109 mmol/L (ref 98–111)
Chloride: 110 mmol/L (ref 98–111)
Chloride: 105 mmol/L (ref 98–111)
Chloride: 107 mmol/L (ref 98–111)
Chloride: 107 mmol/L (ref 98–111)
Creatinine, Ser: 1.4 mg/dL — ABNORMAL HIGH (ref 0.61–1.24)
Creatinine, Ser: 1.4 mg/dL — ABNORMAL HIGH (ref 0.61–1.24)
Creatinine, Ser: 1.3 mg/dL — ABNORMAL HIGH (ref 0.61–1.24)
Creatinine, Ser: 1.3 mg/dL — ABNORMAL HIGH (ref 0.61–1.24)
Creatinine, Ser: 1.2 mg/dL (ref 0.61–1.24)
Glucose, Bld: 128 mg/dL — ABNORMAL HIGH (ref 70–99)
Glucose, Bld: 154 mg/dL — ABNORMAL HIGH (ref 70–99)
Glucose, Bld: 164 mg/dL — ABNORMAL HIGH (ref 70–99)
Glucose, Bld: 199 mg/dL — ABNORMAL HIGH (ref 70–99)
Glucose, Bld: 159 mg/dL — ABNORMAL HIGH (ref 70–99)
HCT: 31 % — ABNORMAL LOW (ref 39.0–52.0)
HCT: 26 % — ABNORMAL LOW (ref 39.0–52.0)
HCT: 20 % — ABNORMAL LOW (ref 39.0–52.0)
HCT: 23 % — ABNORMAL LOW (ref 39.0–52.0)
HCT: 29 % — ABNORMAL LOW (ref 39.0–52.0)
Hemoglobin: 10.5 g/dL — ABNORMAL LOW (ref 13.0–17.0)
Hemoglobin: 8.8 g/dL — ABNORMAL LOW (ref 13.0–17.0)
Hemoglobin: 6.8 g/dL — CL (ref 13.0–17.0)
Hemoglobin: 7.8 g/dL — ABNORMAL LOW (ref 13.0–17.0)
Hemoglobin: 9.9 g/dL — ABNORMAL LOW (ref 13.0–17.0)
Potassium: 4.8 mmol/L (ref 3.5–5.1)
Potassium: 5.5 mmol/L — ABNORMAL HIGH (ref 3.5–5.1)
Potassium: 6 mmol/L — ABNORMAL HIGH (ref 3.5–5.1)
Potassium: 6.9 mmol/L (ref 3.5–5.1)
Potassium: 5.8 mmol/L — ABNORMAL HIGH (ref 3.5–5.1)
Sodium: 140 mmol/L (ref 135–145)
Sodium: 138 mmol/L (ref 135–145)
Sodium: 134 mmol/L — ABNORMAL LOW (ref 135–145)
Sodium: 136 mmol/L (ref 135–145)
Sodium: 138 mmol/L (ref 135–145)
TCO2: 24 mmol/L (ref 22–32)
TCO2: 23 mmol/L (ref 22–32)
TCO2: 23 mmol/L (ref 22–32)
TCO2: 25 mmol/L (ref 22–32)
TCO2: 23 mmol/L (ref 22–32)

## 2019-09-16 LAB — BASIC METABOLIC PANEL
Anion gap: 9 (ref 5–15)
Anion gap: 10 (ref 5–15)
BUN: 35 mg/dL — ABNORMAL HIGH (ref 8–23)
BUN: 31 mg/dL — ABNORMAL HIGH (ref 8–23)
CO2: 20 mmol/L — ABNORMAL LOW (ref 22–32)
CO2: 17 mmol/L — ABNORMAL LOW (ref 22–32)
Calcium: 9.6 mg/dL (ref 8.9–10.3)
Calcium: 8.6 mg/dL — ABNORMAL LOW (ref 8.9–10.3)
Chloride: 109 mmol/L (ref 98–111)
Chloride: 110 mmol/L (ref 98–111)
Creatinine, Ser: 1.49 mg/dL — ABNORMAL HIGH (ref 0.61–1.24)
Creatinine, Ser: 1.37 mg/dL — ABNORMAL HIGH (ref 0.61–1.24)
GFR calc Af Amer: 50 mL/min — ABNORMAL LOW (ref >60)
GFR calc Af Amer: 55 mL/min — ABNORMAL LOW (ref >60)
GFR calc non Af Amer: 43 mL/min — ABNORMAL LOW (ref >60)
GFR calc non Af Amer: 47 mL/min — ABNORMAL LOW (ref >60)
Glucose, Bld: 136 mg/dL — ABNORMAL HIGH (ref 70–99)
Glucose, Bld: 216 mg/dL — ABNORMAL HIGH (ref 70–99)
Potassium: 4.6 mmol/L (ref 3.5–5.1)
Potassium: 5.3 mmol/L — ABNORMAL HIGH (ref 3.5–5.1)
Sodium: 138 mmol/L (ref 135–145)
Sodium: 137 mmol/L (ref 135–145)

## 2019-09-16 LAB — POCT I-STAT 7, (LYTES, BLD GAS, ICA,H+H)
Acid-base deficit: 3 mmol/L — ABNORMAL HIGH (ref 0.0–2.0)
Acid-base deficit: 4 mmol/L — ABNORMAL HIGH (ref 0.0–2.0)
Acid-base deficit: 10 mmol/L — ABNORMAL HIGH (ref 0.0–2.0)
Acid-base deficit: 9 mmol/L — ABNORMAL HIGH (ref 0.0–2.0)
Bicarbonate: 22.1 mmol/L (ref 20.0–28.0)
Bicarbonate: 21.7 mmol/L (ref 20.0–28.0)
Bicarbonate: 15.4 mmol/L — ABNORMAL LOW (ref 20.0–28.0)
Bicarbonate: 16.2 mmol/L — ABNORMAL LOW (ref 20.0–28.0)
Calcium, Ion: 1.1 mmol/L — ABNORMAL LOW (ref 1.15–1.40)
Calcium, Ion: 1.33 mmol/L (ref 1.15–1.40)
Calcium, Ion: 1.28 mmol/L (ref 1.15–1.40)
Calcium, Ion: 1.29 mmol/L (ref 1.15–1.40)
HCT: 20 % — ABNORMAL LOW (ref 39.0–52.0)
HCT: 28 % — ABNORMAL LOW (ref 39.0–52.0)
HCT: 25 % — ABNORMAL LOW (ref 39.0–52.0)
HCT: 25 % — ABNORMAL LOW (ref 39.0–52.0)
Hemoglobin: 6.8 g/dL — CL (ref 13.0–17.0)
Hemoglobin: 9.5 g/dL — ABNORMAL LOW (ref 13.0–17.0)
Hemoglobin: 8.5 g/dL — ABNORMAL LOW (ref 13.0–17.0)
Hemoglobin: 8.5 g/dL — ABNORMAL LOW (ref 13.0–17.0)
O2 Saturation: 100 %
O2 Saturation: 99 %
O2 Saturation: 91 %
O2 Saturation: 96 %
Patient temperature: 36.6
Patient temperature: 36.8
Patient temperature: 36.8
Potassium: 5.5 mmol/L — ABNORMAL HIGH (ref 3.5–5.1)
Potassium: 5.9 mmol/L — ABNORMAL HIGH (ref 3.5–5.1)
Potassium: 5.3 mmol/L — ABNORMAL HIGH (ref 3.5–5.1)
Potassium: 5.2 mmol/L — ABNORMAL HIGH (ref 3.5–5.1)
Sodium: 140 mmol/L (ref 135–145)
Sodium: 140 mmol/L (ref 135–145)
Sodium: 139 mmol/L (ref 135–145)
Sodium: 139 mmol/L (ref 135–145)
TCO2: 23 mmol/L (ref 22–32)
TCO2: 23 mmol/L (ref 22–32)
TCO2: 16 mmol/L — ABNORMAL LOW (ref 22–32)
TCO2: 17 mmol/L — ABNORMAL LOW (ref 22–32)
pCO2 arterial: 39.1 mmHg (ref 32.0–48.0)
pCO2 arterial: 42.7 mmHg (ref 32.0–48.0)
pCO2 arterial: 29.1 mmHg — ABNORMAL LOW (ref 32.0–48.0)
pCO2 arterial: 31.9 mmHg — ABNORMAL LOW (ref 32.0–48.0)
pH, Arterial: 7.361 (ref 7.350–7.450)
pH, Arterial: 7.313 — ABNORMAL LOW (ref 7.350–7.450)
pH, Arterial: 7.33 — ABNORMAL LOW (ref 7.350–7.450)
pH, Arterial: 7.313 — ABNORMAL LOW (ref 7.350–7.450)
pO2, Arterial: 399 mmHg — ABNORMAL HIGH (ref 83.0–108.0)
pO2, Arterial: 151 mmHg — ABNORMAL HIGH (ref 83.0–108.0)
pO2, Arterial: 64 mmHg — ABNORMAL LOW (ref 83.0–108.0)
pO2, Arterial: 86 mmHg (ref 83.0–108.0)

## 2019-09-16 LAB — CBC
HCT: 32.8 % — ABNORMAL LOW (ref 39.0–52.0)
HCT: 29.6 % — ABNORMAL LOW (ref 39.0–52.0)
HCT: 26.5 % — ABNORMAL LOW (ref 39.0–52.0)
Hemoglobin: 10.7 g/dL — ABNORMAL LOW (ref 13.0–17.0)
Hemoglobin: 9.6 g/dL — ABNORMAL LOW (ref 13.0–17.0)
Hemoglobin: 8.7 g/dL — ABNORMAL LOW (ref 13.0–17.0)
MCH: 38.2 pg — ABNORMAL HIGH (ref 26.0–34.0)
MCH: 35.2 pg — ABNORMAL HIGH (ref 26.0–34.0)
MCH: 36.1 pg — ABNORMAL HIGH (ref 26.0–34.0)
MCHC: 32.6 g/dL (ref 30.0–36.0)
MCHC: 32.4 g/dL (ref 30.0–36.0)
MCHC: 32.8 g/dL (ref 30.0–36.0)
MCV: 117.1 fL — ABNORMAL HIGH (ref 80.0–100.0)
MCV: 108.4 fL — ABNORMAL HIGH (ref 80.0–100.0)
MCV: 110 fL — ABNORMAL HIGH (ref 80.0–100.0)
Platelets: 164 10*3/uL (ref 150–400)
Platelets: 139 10*3/uL — ABNORMAL LOW (ref 150–400)
Platelets: ADEQUATE 10*3/uL (ref 150–400)
RBC: 2.8 MIL/uL — ABNORMAL LOW (ref 4.22–5.81)
RBC: 2.73 MIL/uL — ABNORMAL LOW (ref 4.22–5.81)
RBC: 2.41 MIL/uL — ABNORMAL LOW (ref 4.22–5.81)
RDW: 16.9 % — ABNORMAL HIGH (ref 11.5–15.5)
WBC: 10.8 10*3/uL — ABNORMAL HIGH (ref 4.0–10.5)
WBC: 21.9 10*3/uL — ABNORMAL HIGH (ref 4.0–10.5)
WBC: 20.3 10*3/uL — ABNORMAL HIGH (ref 4.0–10.5)
nRBC: 0 % (ref 0.0–0.2)
nRBC: 0 % (ref 0.0–0.2)
nRBC: 0 % (ref 0.0–0.2)

## 2019-09-16 LAB — GLUCOSE, CAPILLARY
Glucose-Capillary: 123 mg/dL — ABNORMAL HIGH (ref 70–99)
Glucose-Capillary: 113 mg/dL — ABNORMAL HIGH (ref 70–99)
Glucose-Capillary: 140 mg/dL — ABNORMAL HIGH (ref 70–99)
Glucose-Capillary: 142 mg/dL — ABNORMAL HIGH (ref 70–99)
Glucose-Capillary: 159 mg/dL — ABNORMAL HIGH (ref 70–99)
Glucose-Capillary: 176 mg/dL — ABNORMAL HIGH (ref 70–99)
Glucose-Capillary: 203 mg/dL — ABNORMAL HIGH (ref 70–99)
Glucose-Capillary: 189 mg/dL — ABNORMAL HIGH (ref 70–99)
Glucose-Capillary: 185 mg/dL — ABNORMAL HIGH (ref 70–99)
Glucose-Capillary: 170 mg/dL — ABNORMAL HIGH (ref 70–99)

## 2019-09-16 LAB — PREPARE RBC (CROSSMATCH)

## 2019-09-16 LAB — PROTIME-INR
INR: 1.3 — ABNORMAL HIGH (ref 0.8–1.2)
Prothrombin Time: 16.4 seconds — ABNORMAL HIGH (ref 11.4–15.2)

## 2019-09-16 LAB — APTT
aPTT: 121 seconds — ABNORMAL HIGH (ref 24–36)
aPTT: 35 seconds (ref 24–36)

## 2019-09-16 LAB — PLATELET COUNT: Platelets: 141 10*3/uL — ABNORMAL LOW (ref 150–400)

## 2019-09-16 LAB — ECHO INTRAOPERATIVE TEE
Height: 70 in
Weight: 3088 oz

## 2019-09-16 LAB — HEMOGLOBIN AND HEMATOCRIT, BLOOD
HCT: 24.4 % — ABNORMAL LOW (ref 39.0–52.0)
Hemoglobin: 7.9 g/dL — ABNORMAL LOW (ref 13.0–17.0)

## 2019-09-16 LAB — MAGNESIUM: Magnesium: 2.9 mg/dL — ABNORMAL HIGH (ref 1.7–2.4)

## 2019-09-16 LAB — HEPARIN LEVEL (UNFRACTIONATED): Heparin Unfractionated: 0.64 IU/mL (ref 0.30–0.70)

## 2019-09-16 LAB — SARS CORONAVIRUS 2 (TAT 6-24 HRS): SARS Coronavirus 2: NEGATIVE

## 2019-09-16 SURGERY — CORONARY ARTERY BYPASS GRAFTING (CABG)
Anesthesia: General | Site: Chest

## 2019-09-16 MED ORDER — SODIUM CHLORIDE 0.45 % IV SOLN
INTRAVENOUS | Status: DC | PRN
  Administered 2019-09-16: 400 mL via INTRAVENOUS

## 2019-09-16 MED ORDER — STERILE WATER FOR INJECTION IJ SOLN
INTRAMUSCULAR | Status: AC
  Filled 2019-09-16: qty 10

## 2019-09-16 MED ORDER — CHLORHEXIDINE GLUCONATE 0.12 % MT SOLN
15.0000 mL | OROMUCOSAL | Status: AC
  Administered 2019-09-16: 15 mL via OROMUCOSAL

## 2019-09-16 MED ORDER — ONDANSETRON HCL 4 MG/2ML IJ SOLN
4.0000 mg | Freq: Four times a day (QID) | INTRAMUSCULAR | Status: DC | PRN
  Administered 2019-09-17 – 2019-09-20 (×3): 4 mg via INTRAVENOUS
  Filled 2019-09-16 (×3): qty 2

## 2019-09-16 MED ORDER — SODIUM CHLORIDE (PF) 0.9 % IJ SOLN
INTRAMUSCULAR | Status: AC
  Filled 2019-09-16: qty 20

## 2019-09-16 MED ORDER — MIDAZOLAM HCL 5 MG/5ML IJ SOLN
INTRAMUSCULAR | Status: DC | PRN
  Administered 2019-09-16 (×4): 1 mg via INTRAVENOUS
  Administered 2019-09-16: 2 mg via INTRAVENOUS

## 2019-09-16 MED ORDER — INSULIN REGULAR(HUMAN) IN NACL 100-0.9 UT/100ML-% IV SOLN: INTRAVENOUS | Status: DC

## 2019-09-16 MED ORDER — COLCHICINE 0.3 MG HALF TABLET
0.3000 mg | ORAL_TABLET | Freq: Two times a day (BID) | ORAL | Status: DC
  Administered 2019-09-16 – 2019-09-20 (×8): 0.3 mg via ORAL
  Filled 2019-09-16 (×10): qty 1

## 2019-09-16 MED ORDER — ROCURONIUM BROMIDE 10 MG/ML (PF) SYRINGE
PREFILLED_SYRINGE | INTRAVENOUS | Status: AC
  Filled 2019-09-16: qty 10

## 2019-09-16 MED ORDER — PHENYLEPHRINE 40 MCG/ML (10ML) SYRINGE FOR IV PUSH (FOR BLOOD PRESSURE SUPPORT)
PREFILLED_SYRINGE | INTRAVENOUS | Status: DC | PRN
  Administered 2019-09-16 (×3): 40 ug via INTRAVENOUS

## 2019-09-16 MED ORDER — ACETAMINOPHEN 160 MG/5ML PO SOLN
1000.0000 mg | Freq: Four times a day (QID) | ORAL | Status: AC
  Administered 2019-09-20 – 2019-09-21 (×2): 1000 mg
  Filled 2019-09-16 (×2): qty 40.6

## 2019-09-16 MED ORDER — LIDOCAINE 2% (20 MG/ML) 5 ML SYRINGE
INTRAMUSCULAR | Status: AC
  Filled 2019-09-16: qty 5

## 2019-09-16 MED ORDER — HEPARIN SODIUM (PORCINE) 1000 UNIT/ML IJ SOLN
INTRAMUSCULAR | Status: DC | PRN
  Administered 2019-09-16: 30000 [IU] via INTRAVENOUS

## 2019-09-16 MED ORDER — POTASSIUM CHLORIDE 10 MEQ/50ML IV SOLN: 10.0000 meq | INTRAVENOUS | Status: AC

## 2019-09-16 MED ORDER — ACETAMINOPHEN 650 MG RE SUPP
650.0000 mg | Freq: Once | RECTAL | Status: AC
  Administered 2019-09-16: 650 mg via RECTAL

## 2019-09-16 MED ORDER — HEMOSTATIC AGENTS (NO CHARGE) OPTIME
TOPICAL | Status: DC | PRN
  Administered 2019-09-16 (×6): 1 via TOPICAL

## 2019-09-16 MED ORDER — 0.9 % SODIUM CHLORIDE (POUR BTL) OPTIME
TOPICAL | Status: DC | PRN
  Administered 2019-09-16: 5000 mL

## 2019-09-16 MED ORDER — LACTATED RINGERS IV SOLN: INTRAVENOUS | Status: DC

## 2019-09-16 MED ORDER — ALBUMIN HUMAN 5 % IV SOLN
250.0000 mL | INTRAVENOUS | Status: AC | PRN
  Administered 2019-09-16 – 2019-09-17 (×4): 12.5 g via INTRAVENOUS
  Filled 2019-09-16 (×2): qty 250

## 2019-09-16 MED ORDER — DOCUSATE SODIUM 100 MG PO CAPS
200.0000 mg | ORAL_CAPSULE | Freq: Every day | ORAL | Status: DC
  Administered 2019-09-17 – 2019-09-20 (×4): 200 mg via ORAL
  Filled 2019-09-16 (×4): qty 2

## 2019-09-16 MED ORDER — FAMOTIDINE IN NACL 20-0.9 MG/50ML-% IV SOLN
20.0000 mg | Freq: Two times a day (BID) | INTRAVENOUS | Status: DC
  Administered 2019-09-16: 20 mg via INTRAVENOUS

## 2019-09-16 MED ORDER — VANCOMYCIN HCL IN DEXTROSE 1-5 GM/200ML-% IV SOLN
1000.0000 mg | Freq: Once | INTRAVENOUS | Status: AC
  Administered 2019-09-16: 1000 mg via INTRAVENOUS
  Filled 2019-09-16: qty 200

## 2019-09-16 MED ORDER — PROTAMINE SULFATE 10 MG/ML IV SOLN
INTRAVENOUS | Status: DC | PRN
  Administered 2019-09-16 (×3): 40 mg via INTRAVENOUS
  Administered 2019-09-16: 50 mg via INTRAVENOUS
  Administered 2019-09-16: 30 mg via INTRAVENOUS
  Administered 2019-09-16: 20 mg via INTRAVENOUS
  Administered 2019-09-16 (×2): 40 mg via INTRAVENOUS

## 2019-09-16 MED ORDER — MORPHINE SULFATE (PF) 2 MG/ML IV SOLN
1.0000 mg | INTRAVENOUS | Status: DC | PRN
  Administered 2019-09-16 (×2): 4 mg via INTRAVENOUS
  Administered 2019-09-16 – 2019-09-17 (×3): 2 mg via INTRAVENOUS
  Filled 2019-09-16 (×4): qty 1
  Filled 2019-09-16 (×2): qty 2

## 2019-09-16 MED ORDER — SODIUM CHLORIDE 0.9 % IV SOLN
1.5000 g | Freq: Two times a day (BID) | INTRAVENOUS | Status: AC
  Administered 2019-09-16 – 2019-09-18 (×4): 1.5 g via INTRAVENOUS
  Filled 2019-09-16 (×4): qty 1.5

## 2019-09-16 MED ORDER — ALBUMIN HUMAN 5 % IV SOLN
INTRAVENOUS | Status: DC | PRN
  Administered 2019-09-16: 13:00:00 via INTRAVENOUS

## 2019-09-16 MED ORDER — SODIUM CHLORIDE 0.9% IV SOLUTION
Freq: Once | INTRAVENOUS | Status: AC
  Administered 2019-09-16: 250 mL via INTRAVENOUS

## 2019-09-16 MED ORDER — ROCURONIUM BROMIDE 10 MG/ML (PF) SYRINGE
PREFILLED_SYRINGE | INTRAVENOUS | Status: DC | PRN
  Administered 2019-09-16: 60 mg via INTRAVENOUS
  Administered 2019-09-16: 30 mg via INTRAVENOUS
  Administered 2019-09-16: 40 mg via INTRAVENOUS
  Administered 2019-09-16: 50 mg via INTRAVENOUS
  Administered 2019-09-16: 40 mg via INTRAVENOUS
  Administered 2019-09-16: 30 mg via INTRAVENOUS

## 2019-09-16 MED ORDER — LACTATED RINGERS IV SOLN
INTRAVENOUS | Status: DC | PRN
  Administered 2019-09-16: 07:00:00 via INTRAVENOUS

## 2019-09-16 MED ORDER — SODIUM CHLORIDE 0.9 % IV SOLN
250.0000 mL | INTRAVENOUS | Status: DC
  Administered 2019-09-16: 250 mL via INTRAVENOUS

## 2019-09-16 MED ORDER — DEXMEDETOMIDINE HCL IN NACL 400 MCG/100ML IV SOLN
0.0000 ug/kg/h | INTRAVENOUS | Status: DC
  Administered 2019-09-16: 0.2 ug/kg/h via INTRAVENOUS
  Filled 2019-09-16: qty 100

## 2019-09-16 MED ORDER — PHENYLEPHRINE 40 MCG/ML (10ML) SYRINGE FOR IV PUSH (FOR BLOOD PRESSURE SUPPORT)
PREFILLED_SYRINGE | INTRAVENOUS | Status: AC
  Filled 2019-09-16: qty 10

## 2019-09-16 MED ORDER — BISACODYL 5 MG PO TBEC
10.0000 mg | DELAYED_RELEASE_TABLET | Freq: Every day | ORAL | Status: DC
  Administered 2019-09-17 – 2019-09-24 (×6): 10 mg via ORAL
  Filled 2019-09-16 (×11): qty 2

## 2019-09-16 MED ORDER — HEPARIN SODIUM (PORCINE) 1000 UNIT/ML IJ SOLN
INTRAMUSCULAR | Status: AC
  Filled 2019-09-16: qty 1

## 2019-09-16 MED ORDER — FENTANYL CITRATE (PF) 250 MCG/5ML IJ SOLN
INTRAMUSCULAR | Status: DC | PRN
  Administered 2019-09-16: 25 ug via INTRAVENOUS
  Administered 2019-09-16: 50 ug via INTRAVENOUS
  Administered 2019-09-16: 100 ug via INTRAVENOUS
  Administered 2019-09-16: 50 ug via INTRAVENOUS
  Administered 2019-09-16: 100 ug via INTRAVENOUS
  Administered 2019-09-16 (×4): 50 ug via INTRAVENOUS
  Administered 2019-09-16: 100 ug via INTRAVENOUS
  Administered 2019-09-16: 25 ug via INTRAVENOUS
  Administered 2019-09-16 (×7): 50 ug via INTRAVENOUS

## 2019-09-16 MED ORDER — TRAMADOL HCL 50 MG PO TABS
50.0000 mg | ORAL_TABLET | ORAL | Status: DC | PRN
  Administered 2019-09-17 – 2019-09-22 (×10): 50 mg via ORAL
  Filled 2019-09-16 (×11): qty 1

## 2019-09-16 MED ORDER — SODIUM CHLORIDE 0.9% FLUSH: 3.0000 mL | INTRAVENOUS | Status: DC | PRN

## 2019-09-16 MED ORDER — NITROGLYCERIN IN D5W 200-5 MCG/ML-% IV SOLN: 0.0000 ug/min | INTRAVENOUS | Status: DC

## 2019-09-16 MED ORDER — SODIUM BICARBONATE 8.4 % IV SOLN
100.0000 meq | Freq: Once | INTRAVENOUS | Status: AC
  Administered 2019-09-16: 100 meq via INTRAVENOUS

## 2019-09-16 MED ORDER — METOPROLOL TARTRATE 5 MG/5ML IV SOLN
2.5000 mg | INTRAVENOUS | Status: DC | PRN
  Administered 2019-09-17: 2.5 mg via INTRAVENOUS
  Filled 2019-09-16: qty 5

## 2019-09-16 MED ORDER — PROTAMINE SULFATE 10 MG/ML IV SOLN
INTRAVENOUS | Status: AC
  Filled 2019-09-16: qty 50

## 2019-09-16 MED ORDER — NOREPINEPHRINE 4 MG/250ML-% IV SOLN
0.0000 ug/min | INTRAVENOUS | Status: DC
  Administered 2019-09-16: 2 ug/min via INTRAVENOUS
  Filled 2019-09-16 (×2): qty 250

## 2019-09-16 MED ORDER — DEXTROSE 50 % IV SOLN: 0.0000 mL | INTRAVENOUS | Status: DC | PRN

## 2019-09-16 MED ORDER — VANCOMYCIN HCL 1000 MG IV SOLR
INTRAVENOUS | Status: DC | PRN
  Administered 2019-09-16 (×3): 1000 mg

## 2019-09-16 MED ORDER — VANCOMYCIN HCL 1000 MG IV SOLR
INTRAVENOUS | Status: AC
  Filled 2019-09-16: qty 3000

## 2019-09-16 MED ORDER — PHENYLEPHRINE HCL-NACL 20-0.9 MG/250ML-% IV SOLN: 0.0000 ug/min | INTRAVENOUS | Status: DC

## 2019-09-16 MED ORDER — BISACODYL 10 MG RE SUPP
10.0000 mg | Freq: Every day | RECTAL | Status: DC
  Administered 2019-10-01: 11:00:00 10 mg via RECTAL
  Filled 2019-09-16: qty 1

## 2019-09-16 MED ORDER — CHLORHEXIDINE GLUCONATE CLOTH 2 % EX PADS
6.0000 | MEDICATED_PAD | Freq: Every day | CUTANEOUS | Status: DC
  Administered 2019-09-16 – 2019-10-01 (×15): 6 via TOPICAL

## 2019-09-16 MED ORDER — PANTOPRAZOLE SODIUM 40 MG PO TBEC
40.0000 mg | DELAYED_RELEASE_TABLET | Freq: Every day | ORAL | Status: DC
  Administered 2019-09-18 – 2019-09-20 (×3): 40 mg via ORAL
  Filled 2019-09-16 (×3): qty 1

## 2019-09-16 MED ORDER — MIDAZOLAM HCL 2 MG/2ML IJ SOLN: 2.0000 mg | INTRAMUSCULAR | Status: DC | PRN

## 2019-09-16 MED ORDER — ASPIRIN EC 81 MG PO TBEC
81.0000 mg | DELAYED_RELEASE_TABLET | Freq: Every day | ORAL | Status: DC
  Administered 2019-09-16 – 2019-09-20 (×5): 81 mg via ORAL
  Filled 2019-09-16 (×5): qty 1

## 2019-09-16 MED ORDER — PROPOFOL 10 MG/ML IV BOLUS
INTRAVENOUS | Status: AC
  Filled 2019-09-16: qty 20

## 2019-09-16 MED ORDER — PLASMA-LYTE 148 IV SOLN
INTRAVENOUS | Status: DC | PRN
  Administered 2019-09-16: 500 mL

## 2019-09-16 MED ORDER — LACTATED RINGERS IV SOLN
INTRAVENOUS | Status: DC | PRN
  Administered 2019-09-16 (×2): via INTRAVENOUS

## 2019-09-16 MED ORDER — ACETAMINOPHEN 160 MG/5ML PO SOLN: 650.0000 mg | Freq: Once | ORAL | Status: AC

## 2019-09-16 MED ORDER — SODIUM CHLORIDE 0.9 % IV SOLN
INTRAVENOUS | Status: DC | PRN
  Administered 2019-09-16: 12:00:00 via INTRAVENOUS

## 2019-09-16 MED ORDER — MIDAZOLAM HCL (PF) 10 MG/2ML IJ SOLN
INTRAMUSCULAR | Status: AC
  Filled 2019-09-16: qty 2

## 2019-09-16 MED ORDER — SODIUM CHLORIDE 0.9 % IV SOLN
INTRAVENOUS | Status: DC
  Administered 2019-09-20 – 2019-09-22 (×2): via INTRAVENOUS

## 2019-09-16 MED ORDER — METOPROLOL TARTRATE 25 MG/10 ML ORAL SUSPENSION: 12.5000 mg | Freq: Two times a day (BID) | ORAL | Status: DC

## 2019-09-16 MED ORDER — SODIUM CHLORIDE 0.9% FLUSH
3.0000 mL | Freq: Two times a day (BID) | INTRAVENOUS | Status: DC
  Administered 2019-09-17 – 2019-09-23 (×9): 3 mL via INTRAVENOUS

## 2019-09-16 MED ORDER — PROPOFOL 10 MG/ML IV BOLUS
INTRAVENOUS | Status: DC | PRN
  Administered 2019-09-16: 80 mg via INTRAVENOUS

## 2019-09-16 MED ORDER — DEXTROSE 5 % IV SOLN: INTRAVENOUS | Status: DC | PRN

## 2019-09-16 MED ORDER — FENTANYL CITRATE (PF) 250 MCG/5ML IJ SOLN
INTRAMUSCULAR | Status: AC
  Filled 2019-09-16: qty 25

## 2019-09-16 MED ORDER — LACTATED RINGERS IV SOLN: 500.0000 mL | Freq: Once | INTRAVENOUS | Status: DC | PRN

## 2019-09-16 MED ORDER — VASOPRESSIN 20 UNIT/ML IV SOLN
INTRAVENOUS | Status: AC
  Filled 2019-09-16: qty 1

## 2019-09-16 MED ORDER — ASPIRIN 81 MG PO CHEW: 324.0000 mg | CHEWABLE_TABLET | Freq: Every day | ORAL | Status: DC

## 2019-09-16 MED ORDER — VANCOMYCIN HCL 1000 MG IV SOLR
INTRAVENOUS | Status: AC
  Administered 2019-09-16: 1000 mL
  Filled 2019-09-16: qty 1000

## 2019-09-16 MED ORDER — MAGNESIUM SULFATE 4 GM/100ML IV SOLN
4.0000 g | Freq: Once | INTRAVENOUS | Status: AC
  Administered 2019-09-16: 4 g via INTRAVENOUS
  Filled 2019-09-16: qty 100

## 2019-09-16 MED ORDER — PROTAMINE SULFATE 10 MG/ML IV SOLN
INTRAVENOUS | Status: AC
  Filled 2019-09-16: qty 5

## 2019-09-16 MED ORDER — OXYCODONE HCL 5 MG PO TABS
5.0000 mg | ORAL_TABLET | ORAL | Status: DC | PRN
  Administered 2019-09-16 – 2019-09-20 (×13): 5 mg via ORAL
  Filled 2019-09-16 (×15): qty 1

## 2019-09-16 MED ORDER — ACETAMINOPHEN 500 MG PO TABS
1000.0000 mg | ORAL_TABLET | Freq: Four times a day (QID) | ORAL | Status: AC
  Administered 2019-09-16 – 2019-09-21 (×15): 1000 mg via ORAL
  Filled 2019-09-16 (×15): qty 2

## 2019-09-16 MED ORDER — ARTIFICIAL TEARS OPHTHALMIC OINT
TOPICAL_OINTMENT | OPHTHALMIC | Status: AC
  Filled 2019-09-16: qty 3.5

## 2019-09-16 MED ORDER — STERILE WATER FOR INJECTION IJ SOLN
INTRAMUSCULAR | Status: DC | PRN
  Administered 2019-09-16: 10 mL

## 2019-09-16 MED ORDER — METOPROLOL TARTRATE 12.5 MG HALF TABLET
12.5000 mg | ORAL_TABLET | Freq: Two times a day (BID) | ORAL | Status: DC
  Administered 2019-09-16 – 2019-09-18 (×4): 12.5 mg via ORAL
  Filled 2019-09-16 (×5): qty 1

## 2019-09-16 MED ORDER — ARTIFICIAL TEARS OPHTHALMIC OINT
TOPICAL_OINTMENT | OPHTHALMIC | Status: DC | PRN
  Administered 2019-09-16: 1 via OPHTHALMIC

## 2019-09-16 MED ORDER — ASPIRIN EC 325 MG PO TBEC: 325.0000 mg | DELAYED_RELEASE_TABLET | Freq: Every day | ORAL | Status: DC

## 2019-09-16 MED FILL — Magnesium Sulfate Inj 50%: INTRAMUSCULAR | Qty: 10 | Status: AC

## 2019-09-16 MED FILL — Heparin Sodium (Porcine) Inj 1000 Unit/ML: INTRAMUSCULAR | Qty: 30 | Status: AC

## 2019-09-16 MED FILL — Potassium Chloride Inj 2 mEq/ML: INTRAVENOUS | Qty: 40 | Status: AC

## 2019-09-16 SURGICAL SUPPLY — 106 items
ADAPTER CARDIO PERF ANTE/RETRO (ADAPTER) ×3 IMPLANT
ADH SKN CLS APL DERMABOND .7 (GAUZE/BANDAGES/DRESSINGS) ×8
ADPR PRFSN 84XANTGRD RTRGD (ADAPTER) ×2
BAG DECANTER FOR FLEXI CONT (MISCELLANEOUS) ×4 IMPLANT
BASKET HEART (ORDER IN 25'S) (MISCELLANEOUS) ×1
BASKET HEART (ORDER IN 25S) (MISCELLANEOUS) ×2 IMPLANT
BLADE CLIPPER SURG (BLADE) ×3 IMPLANT
BLADE STERNUM SYSTEM 6 (BLADE) ×3 IMPLANT
BLADE SURG 11 STRL SS (BLADE) ×1 IMPLANT
BNDG ELASTIC 4X5.8 VLCR STR LF (GAUZE/BANDAGES/DRESSINGS) ×3 IMPLANT
BNDG ELASTIC 6X5.8 VLCR STR LF (GAUZE/BANDAGES/DRESSINGS) ×3 IMPLANT
BNDG GAUZE ELAST 4 BULKY (GAUZE/BANDAGES/DRESSINGS) ×3 IMPLANT
CANISTER SUCT 3000ML PPV (MISCELLANEOUS) ×3 IMPLANT
CANNULA GUNDRY RCSP 15FR (MISCELLANEOUS) ×1 IMPLANT
CANNULA NON VENT 20FR 12 (CANNULA) ×1 IMPLANT
CATH CPB KIT HENDRICKSON (MISCELLANEOUS) ×3 IMPLANT
CATH RETROPLEGIA CORONARY 14FR (CATHETERS) ×1 IMPLANT
CATH ROBINSON RED A/P 18FR (CATHETERS) ×7 IMPLANT
CLIP RETRACTION 3.0MM CORONARY (MISCELLANEOUS) ×2 IMPLANT
CLIP VESOCCLUDE SM WIDE 24/CT (CLIP) ×2 IMPLANT
CONN ST 1/4X3/8  BEN (MISCELLANEOUS) ×2
CONN ST 1/4X3/8 BEN (MISCELLANEOUS) IMPLANT
COVER PROBE W GEL 5X96 (DRAPES) ×1 IMPLANT
DERMABOND ADVANCED (GAUZE/BANDAGES/DRESSINGS) ×4
DERMABOND ADVANCED .7 DNX12 (GAUZE/BANDAGES/DRESSINGS) ×2 IMPLANT
DRAIN CHANNEL 28F RND 3/8 FF (WOUND CARE) ×9 IMPLANT
DRAPE CARDIOVASCULAR INCISE (DRAPES) ×3
DRAPE SLUSH/WARMER DISC (DRAPES) ×3 IMPLANT
DRAPE SRG 135X102X78XABS (DRAPES) ×2 IMPLANT
DRSG AQUACEL AG ADV 3.5X14 (GAUZE/BANDAGES/DRESSINGS) ×3 IMPLANT
ELECT BLADE 4.0 EZ CLEAN MEGAD (MISCELLANEOUS) ×3
ELECT CAUTERY BLADE 6.4 (BLADE) ×3 IMPLANT
ELECT REM PT RETURN 9FT ADLT (ELECTROSURGICAL) ×6
ELECTRODE BLDE 4.0 EZ CLN MEGD (MISCELLANEOUS) IMPLANT
ELECTRODE REM PT RTRN 9FT ADLT (ELECTROSURGICAL) ×4 IMPLANT
FELT TEFLON 1X6 (MISCELLANEOUS) ×6 IMPLANT
GAUZE SPONGE 4X4 12PLY STRL (GAUZE/BANDAGES/DRESSINGS) ×6 IMPLANT
GAUZE SPONGE 4X4 12PLY STRL LF (GAUZE/BANDAGES/DRESSINGS) ×2 IMPLANT
GLOVE BIO SURGEON STRL SZ 6 (GLOVE) ×3 IMPLANT
GLOVE BIO SURGEON STRL SZ 6.5 (GLOVE) ×5 IMPLANT
GLOVE BIOGEL PI IND STRL 6 (GLOVE) IMPLANT
GLOVE BIOGEL PI INDICATOR 6 (GLOVE) ×1
GLOVE NEODERM STRL 7.5 LF PF (GLOVE) ×6 IMPLANT
GLOVE SURG NEODERM 7.5  LF PF (GLOVE) ×3
GOWN STRL REUS W/ TWL LRG LVL3 (GOWN DISPOSABLE) ×8 IMPLANT
GOWN STRL REUS W/TWL LRG LVL3 (GOWN DISPOSABLE) ×30
HEMOSTAT POWDER SURGIFOAM 1G (HEMOSTASIS) ×9 IMPLANT
HEMOSTAT SURGICEL 2X14 (HEMOSTASIS) ×3 IMPLANT
INSERT FOGARTY XLG (MISCELLANEOUS) ×1 IMPLANT
KIT BASIN OR (CUSTOM PROCEDURE TRAY) ×3 IMPLANT
KIT SUCTION CATH 14FR (SUCTIONS) ×3 IMPLANT
KIT TURNOVER KIT B (KITS) ×3 IMPLANT
KIT VASOVIEW HEMOPRO 2 VH 4000 (KITS) ×3 IMPLANT
LEAD PACING MYOCARDI (MISCELLANEOUS) ×3 IMPLANT
MARKER GRAFT CORONARY BYPASS (MISCELLANEOUS) ×9 IMPLANT
NDL 18GX1X1/2 (RX/OR ONLY) (NEEDLE) ×2 IMPLANT
NEEDLE 18GX1X1/2 (RX/OR ONLY) (NEEDLE) ×6 IMPLANT
NS IRRIG 1000ML POUR BTL (IV SOLUTION) ×15 IMPLANT
PACK E OPEN HEART (SUTURE) ×3 IMPLANT
PACK OPEN HEART (CUSTOM PROCEDURE TRAY) ×3 IMPLANT
PACK SPY-PHI (KITS) ×1 IMPLANT
PAD ARMBOARD 7.5X6 YLW CONV (MISCELLANEOUS) ×6 IMPLANT
PAD ELECT DEFIB RADIOL ZOLL (MISCELLANEOUS) ×3 IMPLANT
PENCIL BUTTON HOLSTER BLD 10FT (ELECTRODE) ×3 IMPLANT
POSITIONER HEAD DONUT 9IN (MISCELLANEOUS) ×3 IMPLANT
POWDER SURGICEL 3.0 GRAM (HEMOSTASIS) ×1 IMPLANT
PUNCH AORTIC ROTATE  4.5MM 8IN (MISCELLANEOUS) ×1 IMPLANT
SEALANT SURG COSEAL 4ML (VASCULAR PRODUCTS) ×1 IMPLANT
SET CARDIOPLEGIA MPS 5001102 (MISCELLANEOUS) ×1 IMPLANT
SUT BONE WAX W31G (SUTURE) ×3 IMPLANT
SUT ETHIBOND X763 2 0 SH 1 (SUTURE) ×2 IMPLANT
SUT MNCRL AB 3-0 PS2 18 (SUTURE) ×6 IMPLANT
SUT MNCRL AB 4-0 PS2 18 (SUTURE) ×1 IMPLANT
SUT PDS AB 1 CTX 36 (SUTURE) ×6 IMPLANT
SUT PROLENE 3 0 SH DA (SUTURE) ×3 IMPLANT
SUT PROLENE 4 0 SH DA (SUTURE) ×4 IMPLANT
SUT PROLENE 5 0 C 1 36 (SUTURE) IMPLANT
SUT PROLENE 6 0 C 1 30 (SUTURE) ×7 IMPLANT
SUT PROLENE 7 0 BV 1 (SUTURE) ×2 IMPLANT
SUT PROLENE 7 0 BV1 MDA (SUTURE) ×2 IMPLANT
SUT PROLENE 8 0 BV175 6 (SUTURE) IMPLANT
SUT PROLENE BLUE 7 0 (SUTURE) ×3 IMPLANT
SUT SILK  1 MH (SUTURE) ×1
SUT SILK 1 MH (SUTURE) IMPLANT
SUT SILK 2 0 SH CR/8 (SUTURE) IMPLANT
SUT SILK 3 0 SH CR/8 (SUTURE) IMPLANT
SUT STEEL 6MS V (SUTURE) ×3 IMPLANT
SUT STEEL SZ 6 DBL 3X14 BALL (SUTURE) ×3 IMPLANT
SUT VIC AB 2-0 CT1 27 (SUTURE) ×3
SUT VIC AB 2-0 CT1 TAPERPNT 27 (SUTURE) IMPLANT
SUT VIC AB 2-0 CTX 27 (SUTURE) IMPLANT
SUT VIC AB 3-0 X1 27 (SUTURE) IMPLANT
SYR 10ML LL (SYRINGE) ×2 IMPLANT
SYR 30ML LL (SYRINGE) ×3 IMPLANT
SYR 3ML LL SCALE MARK (SYRINGE) ×3 IMPLANT
SYSTEM SAHARA CHEST DRAIN ATS (WOUND CARE) ×3 IMPLANT
TAPE CLOTH SURG 4X10 WHT LF (GAUZE/BANDAGES/DRESSINGS) ×1 IMPLANT
TAPE PAPER 2X10 WHT MICROPORE (GAUZE/BANDAGES/DRESSINGS) ×1 IMPLANT
TOWEL GREEN STERILE (TOWEL DISPOSABLE) ×3 IMPLANT
TOWEL GREEN STERILE FF (TOWEL DISPOSABLE) ×3 IMPLANT
TRAY FOLEY SLVR 16FR TEMP STAT (SET/KITS/TRAYS/PACK) ×3 IMPLANT
TUBING ART PRESS 48 MALE/FEM (TUBING) ×2 IMPLANT
TUBING LAP HI FLOW INSUFFLATIO (TUBING) ×3 IMPLANT
UNDERPAD 30X30 (UNDERPADS AND DIAPERS) ×3 IMPLANT
WATER STERILE IRR 1000ML POUR (IV SOLUTION) ×6 IMPLANT
WATER STERILE IRR 1000ML UROMA (IV SOLUTION) IMPLANT

## 2019-09-16 NOTE — Plan of Care (Signed)
  Problem: Nutrition: Goal: Adequate nutrition will be maintained Outcome: Completed/Met   Problem: Coping: Goal: Level of anxiety will decrease Outcome: Completed/Met   Problem: Elimination: Goal: Will not experience complications related to bowel motility Outcome: Completed/Met Goal: Will not experience complications related to urinary retention Outcome: Completed/Met   Problem: Pain Managment: Goal: General experience of comfort will improve Outcome: Completed/Met   Problem: Safety: Goal: Ability to remain free from injury will improve Outcome: Completed/Met   Problem: Skin Integrity: Goal: Risk for impaired skin integrity will decrease Outcome: Completed/Met

## 2019-09-16 NOTE — Transfer of Care (Signed)
Immediate Anesthesia Transfer of Care Note  Patient: Brandon Lewis  Procedure(s) Performed: CORONARY ARTERY BYPASS GRAFTING (CABG) using LIMA to LAD; Endoscopic right saphenous vein harvest to OM1, Diag1, and RCA. (N/A Chest) TRANSESOPHAGEAL ECHOCARDIOGRAM (TEE) (N/A ) INDOCYANINE GREEN FLUORESCENCE IMAGING (ICG) (N/A )  Patient Location: ICU  Anesthesia Type:General  Level of Consciousness: Patient remains intubated per anesthesia plan  Airway & Oxygen Therapy: Patient placed on Ventilator (see vital sign flow sheet for setting)  Post-op Assessment: Report given to RN and Post -op Vital signs reviewed and stable  Post vital signs: Reviewed  Last Vitals:  Vitals Value Taken Time  BP    Temp    Pulse    Resp    SpO2      Last Pain:  Vitals:   09/16/19 0512  TempSrc: Oral  PainSc:          Complications: No apparent anesthesia complications

## 2019-09-16 NOTE — Progress Notes (Signed)
     [id number]       [place] [address]             [phone]       Waking up Good hemodynamics.  On 6 of levo Minimal CT output

## 2019-09-16 NOTE — Brief Op Note (Addendum)
09/12/2019 - 09/16/2019  11:51 AM  PATIENT:  Brandon Lewis  85 y.o. male  PRE-OPERATIVE DIAGNOSIS:  1. S/p NSTEMI 2. CORONARY ARTERY DISEASE  POST-OPERATIVE DIAGNOSIS:  1. S/p NSTEMI 2. CORONARY ARTERY DISEASE  PROCEDURE:  TRANSESOPHAGEAL ECHOCARDIOGRAM (TEE), MEDIAN STERNOTOMY for CORONARY ARTERY BYPASS GRAFTING (CABG) x 4 (LIMA to LAD, SVG to OM, SVG to DIAGONAL, SVG to RCA) with EVH from [place] and LEFT INTERNAL MAMMARY ARTERY HARVEST, and  INDOCYANINE GREEN FLUORESCENCE IMAGING (ICG)   SURGEON:  Surgeon(s) and Role:     Wonda Olds, MD - Primary  PHYSICIAN ASSISTANT: Lars Pinks PA-C  ANESTHESIA:   general  EBL:  Per anesthesia and perfusion record  BLOOD ADMINISTERED:Two CC PRBC  DRAINS: Chest tubes placed in the mediastinal and pleural spaces   COUNTS:  YES  DICTATION: .Dragon Dictation  PLAN OF CARE: Admit to inpatient   PATIENT DISPOSITION:  ICU - intubated and hemodynamically stable.   Delay start of Pharmacological VTE agent (>24hrs) due to surgical blood loss or risk of bleeding: yes  BASELINE WEIGHT: 87.5 kg  Agree with details of Brief Operative Note. Lanard Arguijo Z. Orvan Seen, [place]

## 2019-09-16 NOTE — H&P (Signed)
History and Physical Interval Note:  09/16/2019 7:10 AM  Brandon Lewis  has presented today for surgery, with the diagnosis of CAD.  The various methods of treatment have been discussed with the patient and family. After consideration of risks, benefits and other options for treatment, the patient has consented to  Procedure(s): CORONARY ARTERY BYPASS GRAFTING (CABG) (N/A) TRANSESOPHAGEAL ECHOCARDIOGRAM (TEE) (N/A) INDOCYANINE GREEN FLUORESCENCE IMAGING (ICG) (N/A) as a surgical intervention.  The patient's history has been reviewed, patient examined, no change in status, stable for surgery.  I have reviewed the patient's chart and labs.  Questions were answered to the patient's satisfaction.     Brandon Lewis

## 2019-09-16 NOTE — Op Note (Signed)
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 Murvin Natal, MD  Assistant: Josie Saunders PA-C  Anesthesia: get  Operative Findings:  preserved left ventricular systolic function  good quality left internal mammary artery conduit  good quality saphenous vein conduit  good quality target vessels for grafting    BRIEF CLINICAL NOTE AND INDICATIONS FOR SURGERY  85 yo man awoke with rest angina approximately 5 nights ago; presented to local ED where NSTEMI diagnosed. LHC performed to assess, demonstrating 3V CAD including LM cad. Presents for CABG now after thorough preoperative evaluation and discussion of risks/benefits.   DETAILS OF THE OPERATIVE PROCEDURE  Preparation:  The patient is brought to the operating room on the above mentioned date and central monitoring was established by the anesthesia team including placement of Swan-Ganz catheter and radial arterial line. The patient is placed in the supine position on the operating table.  Intravenous antibiotics are administered. General endotracheal anesthesia is induced uneventfully. A Foley catheter is placed.  Baseline transesophageal echocardiogram was performed.  Findings were notable for preserved LV function and no significant valvular disease  The patient's chest, abdomen, both groins, and  both lower extremities are prepared and draped in a sterile manner. A time out procedure is performed.   Surgical Approach and Conduit Harvest:  A median sternotomy incision was performed and the left internal mammary artery is dissected from the chest wall and prepared for bypass grafting. The left internal mammary artery is notably good quality conduit. Simultaneously, the greater saphenous vein is obtained from the patient's right thigh using endoscopic vein harvest technique. 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 After removal of the saphenous vein, the small surgical incisions in the lower extremity are closed with absorbable suture. Following systemic heparinization, the left internal mammary artery was transected distally noted to have excellent flow.   Extracorporeal Cardiopulmonary Bypass and Myocardial Protection:  The pericardium is opened. The ascending aorta is nondiseased in appearance. Epiaortic ultrasonography is performed which demonstrates the ascending aorta is clear of calcific disease or pathologically thickened aortic wall. The ascending aorta and the right atrium are cannulated for cardiopulmonary bypass.  Adequate heparinization is verified.   A retrograde cardioplegia cannula is placed through the right atrium into the coronary sinus.  The entire pre-bypass portion of the operation was notable for stable hemodynamics.  Cardiopulmonary bypass was begun and the surface of the heart is inspected. Distal target vessels are selected for coronary artery bypass grafting. A cardioplegia cannula is placed in the ascending aorta.  The patient is allowed to cool passively to 34 C systemic temperature.  The aortic cross clamp is applied and cold blood cardioplegia is delivered initially in an antegrade fashion through the aortic root. Supplemental cardioplegia is given retrograde through the coronary sinus catheter.  Iced  saline slush is applied for topical hypothermia.  The initial cardioplegic arrest is rapid with early diastolic arrest.  Repeat  doses of cardioplegia are administered intermittently throughout the entire cross clamp portion of the operation through the aortic root,  through the coronary sinus catheter, and through subsequently placed vein grafts in order to maintain completely flat electrocardiogram.    Coronary Artery Bypass Grafting:  The distal obtuse marginal branch of the left circumflex coronary artery was grafted using a reversed saphenous vein graft in an end-to-side fashion.  At the site of distal anastomosis the target vessel was good quality and measured approximately 1.5 mm in diameter. Anastomotic patency and runoff was confirmed with indocyanine green fluorescence imaging (SPY). The 1st diagonal branch of the left anterior descending coronary artery was grafted using a reversed saphenous vein graft in an end-to-side fashion.  At the site of distal anastomosis the target vessel was good quality and measured approximately 1.5 mm in diameter. Anastomotic patency and runoff was confirmed with indocyanine green fluorescence imaging (SPY). The proximal aspect of the diagonal graft was anastomosed end to side the the proximal portion of the OM graft to preserve vein length, thereby conferring a "sequenced graft" arrangement.   The posterior descending branch of the right coronary artery was grafted using a reversed saphenous vein graft in an end-to-side fashion.  At the site of distal anastomosis the target vessel was good quality and measured approximately 1.5 mm in diameter. Anastomotic patency and runoff was confirmed with indocyanine green fluorescence imaging (SPY).  The distal left anterior coronary artery was grafted with the left internal mammary artery in an end-to-side fashion.  At the site of distal anastomosis the target vessel was good quality and measured approximately 1.5 mm in  diameter. Anastomotic patency and runoff was confirmed with indocyanine green fluorescence imaging (SPY).  All proximal vein graft anastomoses were placed directly to the ascending aorta prior to removal of the aortic cross clamp.  The septal myocardial temperature rose rapidly after reperfusion of the left internal mammary artery graft.  The aortic cross clamp was removed after deairing procedures were performed   Procedure Completion:  All proximal and distal coronary anastomoses were inspected for hemostasis and appropriate graft orientation. Epicardial pacing wires are fixed to the right ventricular outflow tract and to the right atrial appendage. The patient is rewarmed to 37C temperature. The patient is weaned and disconnected from cardiopulmonary bypass.  The patient's rhythm at separation from bypass was A_V paced.  The patient was weaned from cardiopulmonary bypass  without any inotropic support.   Followup transesophageal echocardiogram performed after separation from bypass revealed no changes from the preoperative exam.  The aortic and venous cannula were removed uneventfully. Protamine was administered to reverse the anticoagulation. The mediastinum and pleural space were inspected for hemostasis and irrigated with saline solution. The mediastinum and left pleural space were drained using fluted chest tubes placed through separate stab incisions inferiorly.  The soft tissues anterior to the aorta were reapproximated loosely. The sternum is closed with double strength sternal wire. The soft tissues anterior to the sternum were closed in multiple layers and the skin is closed with a running subcuticular skin closure.  The post-bypass portion of the operation was notable for stable rhythm and hemodynamics.    Disposition:  The patient tolerated the procedure well and is transported to the surgical intensive care in stable condition. There are no intraoperative complications. All sponge  instrument and needle counts are verified correct at completion of the operation.    Jayme Cloud, MD 09/16/2019 12:55 PM

## 2019-09-16 NOTE — Plan of Care (Addendum)
Night shift progress note:  UOP: 570cc. CT output: 470cc. Received 1u pRBC overnight; mucosa now less pale. Dangled after extubation last night, dangled and stood at bedside this morning. Tolerated well. Pacer still set to AAI@92  per MD. On 57mcg Levo. Urine is red-brown from blood. Creatinine has risen to 1.5, K 4.9. Pain managed adequately with PRN morphine and oxycodone. Will continue to reinforce pulmonary toilet. Still on Insulin gtt, but foresee being able to transition off within next few hours.  Problem: Education: Goal: Knowledge of General Education information will improve Description: Including pain rating scale, medication(s)/side effects and non-pharmacologic comfort measures Outcome: Progressing   Problem: Health Behavior/Discharge Planning: Goal: Ability to manage health-related needs will improve Outcome: Progressing   Problem: Clinical Measurements: Goal: Ability to maintain clinical measurements within normal limits will improve Outcome: Progressing Goal: Will remain free from infection Outcome: Progressing Goal: Diagnostic test results will improve Outcome: Progressing Goal: Respiratory complications will improve Outcome: Progressing Goal: Cardiovascular complication will be avoided Outcome: Progressing   Problem: Activity: Goal: Risk for activity intolerance will decrease Outcome: Progressing

## 2019-09-16 NOTE — Progress Notes (Signed)
Foley catheter was leaking, so I removed all the water from the balloon (only 7cc present) and reinflated it with the required 10cc. Only scant leakage noted after this. Pt tolerated well.

## 2019-09-16 NOTE — Progress Notes (Signed)
  Echocardiogram Echocardiogram Transesophageal has been performed.  Brandon Lewis 09/16/2019, 8:17 AM

## 2019-09-16 NOTE — OR Nursing (Signed)
SPY-PHI KIT - Lot# [id number] / Exp: 08/20/2021: indocyanine green diluted with 20 mL sterile water inj; 0.5 mL of solvent injected into 108m 0.9% NaCl; 30 mL removed and placed on sterile field to be combined with 10 mL of patient heparinized blood for total of 40 mL of mixture to be used by Dr. BSammuel Cooper MD at appropriate time.

## 2019-09-16 NOTE — Consult Note (Signed)
Brandon Lewis       ,[place] [address]             [phone]        Sandy H Foulks Mullen Medical Record [id number] Date of Birth: 24-Feb-1934  Referring: No ref. provider found Primary Care: Venia Carbon, MD Primary Cardiologist:Bruce Kelli Hope, MD  Chief Complaint:   No chief complaint on file.  Chest pain History of Present Illness:     85 yo man with history of CAD s/p PCI and PVD s/p ABF was awoken from sleep last Sunday with chest pain. This was severe enough to call 911. Presented to [facility] ED and ruled in for NSTEMI. Underwent LHC which showed severe 3V CAD including 85% LM disease. Txferred to [facility] for CABG consult. He has been worked up thoroughly and presents for CABG.    Current Activity/ Functional Status: Patient will be independent with mobility/ambulation, transfers, ADL's, IADL's.   Zubrod Score: At the time of surgery this patients most appropriate activity status/level should be described as: []     0    Normal activity, no symptoms [x]     1    Restricted in physical strenuous activity but ambulatory, able to do out light work []     2    Ambulatory and capable of self care, unable to do work activities, up and about                 more than 50%  Of the time                            []     3    Only limited self care, in bed greater than 50% of waking hours []     4    Completely disabled, no self care, confined to bed or chair []     5    Moribund  Past Medical History:  Diagnosis Date   CAD (coronary artery disease)    s/p PCI after presenting with exertional angina (drug eluting stent)   Diverticulosis    GERD (gastroesophageal reflux disease)    HTN (hypertension)    Hyperlipidemia    Osteoarthritis    PVD (peripheral vascular disease) ([place])    s/p aortobifemoral bypass, with bilateral SFA occlusion and intermittent claudication    Small bowel obstruction ([place])     Past Surgical History:  Procedure  Laterality Date   aorto-bifem  2003   [facility]    CAROTID STENT     [facility]   CATARACT EXTRACTION, BILATERAL  2017   CHOLECYSTECTOMY  2002   LEFT HEART CATH AND CORONARY ANGIOGRAPHY N/A 09/12/2019   Procedure: LEFT HEART CATH AND CORONARY ANGIOGRAPHY;  Surgeon: Corey Skains, MD;  Location: [place] [facility];  Service: Cardiovascular;  Laterality: N/A;   TONSILLECTOMY     WISDOM TOOTH EXTRACTION      Social History   Tobacco Use  Smoking Status Former Smoker   Types: Cigarettes   Quit date: 10/20/1992   Years since quitting: 26.9  Smokeless Tobacco Never Used  Tobacco Comment   quit in 1994     Social History   Substance and Sexual Activity  Alcohol Use Not Currently   Comment: rare     Allergies  Allergen Reactions   Cilostazol Other (See Comments)    REACTION: stomach problems- constipation   Simvastatin Other (See Comments)  REACTION: myalgia    Current Facility-Administered Medications  Medication Dose Route Frequency Provider Last Rate Last Dose   0.9 % irrigation (POUR BTL)    PRN Wonda Olds, MD   5,000 mL at 09/16/19 0704   [MAR Hold] acetaminophen (TYLENOL) tablet 650 mg  650 mg Oral Q4H PRN Roby Lofts M., PA-C       [MAR Hold] amLODipine (NORVASC) tablet 5 mg  5 mg Oral Daily Kroeger, Krista M., PA-C   5 mg at 09/15/19 [id number]   [employer] Hold] aspirin EC tablet 81 mg  81 mg Oral Daily Roby Lofts M., PA-C   81 mg at 09/15/19 [id number]   bisacodyl (DULCOLAX) EC tablet 5 mg  5 mg Oral Once Evora Schechter, Glenice Bow, MD       cefUROXime (ZINACEF) 1.5 g in sodium chloride 0.9 % 100 mL IVPB  1.5 g Intravenous To OR Turner, Traci R, MD       cefUROXime (ZINACEF) 750 mg in sodium chloride 0.9 % 100 mL IVPB  750 mg Intravenous To OR Turner, Traci R, MD       dexmedetomidine (PRECEDEX) 400 MCG/100ML (4 mcg/mL) infusion  0.1-0.7 mcg/kg/hr Intravenous To OR Turner, Traci R, MD       DOPamine (INTROPIN) 800 mg in dextrose 5 % 250 mL (3.2 mg/mL)  infusion  0-10 mcg/kg/min Intravenous To OR Turner, Traci R, MD       EPINEPHrine (ADRENALIN) 4 mg in NS 250 mL (0.016 mg/mL) premix infusion  0-10 mcg/min Intravenous To OR Turner, Traci R, MD       heparin 2,500 Units, papaverine 30 mg in electrolyte-148 (PLASMALYTE-148) 500 mL irrigation   Irrigation To OR Turner, Traci R, MD       heparin 2,500 Units, papaverine 30 mg in electrolyte-148 (PLASMALYTE-148) 500 mL irrigation    PRN Wonda Olds, MD   500 mL at 09/16/19 0710   heparin 30,000 units/NS 1000 mL solution for CELLSAVER   Other To OR Turner, Traci R, MD       heparin ADULT infusion 100 units/mL (25000 units/211mL sodium chloride 0.45%)  1,350 Units/hr Intravenous Continuous Donnamae Jude, [facility]   Stopped at 09/16/19 0510   insulin regular, human (MYXREDLIN) 100 units/ 100 mL infusion   Intravenous To OR Turner, Traci R, MD       magnesium sulfate (IV Push/IM) injection 40 mEq  40 mEq Other To OR Turner, Eber Hong, MD       [MAR Hold] metoprolol succinate (TOPROL-XL) 24 hr tablet 50 mg  50 mg Oral Daily Kroeger, Krista M., PA-C   50 mg at 09/15/19 [id number]   milrinone (PRIMACOR) 20 MG/100 ML (0.2 mg/mL) infusion  0.3 mcg/kg/min Intravenous To OR Turner, Eber Hong, MD       [MAR Hold] multivitamins with iron tablet 1 tablet  1 tablet Oral Daily Wonda Olds, MD   1 tablet at 09/15/19 0808   [MAR Hold] nitroGLYCERIN (NITROSTAT) SL tablet 0.4 mg  0.4 mg Sublingual Q5 Min x 3 PRN Kroeger, Krista M., PA-C       nitroGLYCERIN 50 mg in dextrose 5 % 250 mL (0.2 mg/mL) infusion  2-200 mcg/min Intravenous To OR Turner, Traci R, MD       norepinephrine (LEVOPHED) 4mg  in 253mL premix infusion  0-40 mcg/min Intravenous To OR Turner, Eber Hong, MD       [MAR Hold] ondansetron (ZOFRAN) injection 4 mg  4 mg Intravenous Q6H PRN Tommye Standard, Lorelee Cover., PA-C  phenylephrine (NEOSYNEPHRINE) 20-0.9 MG/250ML-% infusion  30-200 mcg/min Intravenous To OR Turner, Traci R, MD       pneumococcal 23  valent vaccine (PNEUMOVAX-23) injection 0.5 mL  0.5 mL Intramuscular Once Turner, Eber Hong, MD       potassium chloride injection 80 mEq  80 mEq Other To OR Turner, Eber Hong, MD       [MAR Hold] rosuvastatin (CRESTOR) tablet 20 mg  20 mg Oral q1800 Sueanne Margarita, MD   20 mg at 09/15/19 1722   temazepam (RESTORIL) capsule 15 mg  15 mg Oral Once PRN Stevin Bielinski, Glenice Bow, MD       tranexamic acid (CYKLOKAPRON) 2,500 mg in sodium chloride 0.9 % 250 mL (10 mg/mL) infusion  1.5 mg/kg/hr Intravenous To OR Turner, Traci R, MD       tranexamic acid (CYKLOKAPRON) bolus via infusion - over 30 minutes 1,312.5 mg  15 mg/kg Intravenous To OR Turner, Traci R, MD       tranexamic acid (CYKLOKAPRON) pump prime solution 175 mg  2 mg/kg Intracatheter To OR Turner, Traci R, MD       vancomycin (VANCOCIN) 1,500 mg in sodium chloride 0.9 % 250 mL IVPB  1,500 mg Intravenous To OR Turner, Traci R, MD        Medications Prior to Admission  Medication Sig Dispense Refill Last Dose   cholecalciferol (VITAMIN D) 1000 UNITS tablet Take 1,000 Units by mouth at bedtime.    09/10/2019 at pm   clopidogrel (PLAVIX) 75 MG tablet TAKE ONE TABLET BY MOUTH EVERY DAY *BOTTLE* (Patient taking differently: Take 75 mg by mouth every morning. ) 90 tablet 3 09/10/2019 at 900   losartan (COZAAR) 100 MG tablet TAKE ONE TABLET BY MOUTH EACH MORNING FOR BLOOD PRESSURE CONTROL. *BOTTLE* *BOTTLE* (Patient taking differently: 100 mg every morning. For blood pressure control) 30 tablet 11 09/10/2019 at am   magnesium oxide (MAG-OX) 400 MG tablet Take 400 mg by mouth at bedtime.   09/10/2019 at pm   metoprolol succinate (TOPROL-XL) 50 MG 24 hr tablet **VIAL ONLY** TAKE (1) TABLET BY MOUTH ONCE A DAY.DO NOT CRUSH. (HIGHBLOOD PRESSURE) *BOTTLE* (Patient taking differently: Take 50 mg by mouth every morning. For high blood pressure) 30 tablet 11 09/10/2019 at 900   Multiple Vitamin (MULTIVITAMIN WITH MINERALS) TABS tablet Take 1 tablet by mouth  every morning.   09/10/2019 at am   rosuvastatin (CRESTOR) 10 MG tablet **VIAL ONLY** TAKE ONE TABLET BY MOUTH AT BEDTIME. (IMPROVES CHOLESTEROL) (Patient taking differently: Take 10 mg by mouth every morning. ) 90 tablet 3 09/10/2019 at am   sodium chloride (BRONCHO SALINE) inhaler solution Take 1 spray by nebulization daily as needed (congestion).   week ago   gentamicin cream (GARAMYCIN) 0.1 % Apply 1 application topically 2 (two) times daily. (Patient not taking: Reported on 09/12/2019) 15 g 1 Not Taking at Unknown time   sildenafil (REVATIO) 20 MG tablet TAKE 3-5 TABLETS BY MOUTH DAILY AS NEEDED (Patient not taking: Reported on 09/12/2019) 50 tablet 11 Not Taking at Unknown time    Family History  Problem Relation Age of Onset   Diabetes Father        DM - and brother    Stroke Father    Coronary artery disease Paternal Grandfather    Colon cancer Neg Hx    Prostate cancer Neg Hx      Review of Systems:   ROS A comprehensive review of systems was negative except for: details  of HPI     Cardiac Review of Systems: Y or  [    ]= no  Chest Pain [    ]  Resting SOB [   ] Exertional SOB  [  ]  Orthopnea [  ]   Pedal Edema [   ]    Palpitations [  ] Syncope  [  ]   Presyncope [   ]  General Review of Systems: [Y] = yes [  ]=no Constitional: recent weight change [  ]; anorexia [  ]; fatigue [  ]; nausea [  ]; night sweats [  ]; fever [  ]; or chills [  ]                                                               Dental: Last Dentist visit:   Eye : blurred vision [  ]; diplopia [   ]; vision changes [  ];  Amaurosis fugax[  ]; Resp: cough [  ];  wheezing[  ];  hemoptysis[  ]; shortness of breath[  ]; paroxysmal nocturnal dyspnea[  ]; dyspnea on exertion[  ]; or orthopnea[  ];  GI:  gallstones[  ], vomiting[  ];  dysphagia[  ]; melena[  ];  hematochezia [  ]; heartburn[  ];   Hx of  Colonoscopy[  ]; GU: kidney stones [  ]; hematuria[  ];   dysuria [  ];  nocturia[  ];  history  of     obstruction [  ]; urinary frequency [  ]             Skin: rash, swelling[  ];, hair loss[  ];  peripheral edema[  ];  or itching[  ]; Musculosketetal: myalgias[  ];  joint swelling[  ];  joint erythema[  ];  joint pain[  ];  back pain[  ];  Heme/Lymph: bruising[  ];  bleeding[  ];  anemia[  ];  Neuro: TIA[  ];  headaches[  ];  stroke[  ];  vertigo[  ];  seizures[  ];   paresthesias[  ];  difficulty walking[  ];  Psych:depression[  ]; anxiety[  ];  Endocrine: diabetes[  ];  thyroid dysfunction[  ];             Physical Exam: BP 121/74 (BP Location: Left Arm)    Pulse 71    Temp 98 F (36.7 C) (Oral)    Resp 18    Ht 5\' 10"  (1.778 m)    Wt 87.5 kg    SpO2 98%    BMI 27.69 kg/m    General appearance: alert and cooperative Head: Normocephalic, without obvious abnormality, atraumatic Resp: clear to auscultation bilaterally Cardio: regular rate and rhythm, S1, S2 normal, no murmur, click, rub or gallop GI: soft, non-tender; bowel sounds normal; no masses,  no organomegaly Extremities: left leg varicosities; cool bilateral lower extremities Neurologic: Alert and oriented X 3, normal strength and tone. Normal symmetric reflexes. Normal coordination and gait  Diagnostic Studies & Laboratory data:     Recent Radiology Findings:   Ct Head Wo Contrast  Result Date: 09/14/2019 CLINICAL DATA:  Dementia, preoperative EXAM: CT HEAD WITHOUT CONTRAST TECHNIQUE: Contiguous axial images were obtained from the base of the skull  through the vertex without intravenous contrast. COMPARISON:  02/05/2008 FINDINGS: Brain: No evidence of acute infarction, hemorrhage, hydrocephalus, extra-axial collection or mass lesion/mass effect. Periventricular white matter hypodensity. Mild global volume loss, which has progressed compared to examination dated 2009. Vascular: No hyperdense vessel or unexpected calcification. Skull: Normal. Negative for fracture or focal lesion. Sinuses/Orbits: No acute finding. Other:  None. IMPRESSION: No acute intracranial pathology. Small-vessel white matter disease and mild global volume loss, which has progressed compared to examination dated 2009. Electronically Signed   By: Eddie Candle M.D.   On: 09/14/2019 09:12   Ct Chest Wo Contrast  Result Date: 09/14/2019 CLINICAL DATA:  Follow-up thoracic aortic aneurysm. EXAM: CT CHEST WITHOUT CONTRAST TECHNIQUE: Multidetector CT imaging of the chest was performed following the standard protocol without IV contrast. COMPARISON:  None. FINDINGS: Cardiovascular: Ascending thoracic aortic aneurysm is seen measuring 4.1 cm. No evidence of mediastinal hematoma or pericardial effusion. Aortic and coronary artery atherosclerosis. Mediastinum/Nodes: 2.2 cm low-attenuation right thyroid lobe nodule is seen. No pathologically enlarged lymph nodes identified on this unenhanced exam. Lungs/Pleura: No pulmonary infiltrate or mass identified. No pleural effusion present. Mild bilateral pleural-parenchymal scarring noted. Upper Abdomen: A few small fluid attenuation hepatic cysts are noted. Musculoskeletal:  No suspicious bone lesions. IMPRESSION: 1. 4.1 cm ascending thoracic aortic aneurysm. Recommend annual imaging followup by CTA or MRA. This recommendation follows 2010 ACCF/AHA/AATS/ACR/ASA/SCA/SCAI/SIR/STS/SVM Guidelines for the Diagnosis and Management of Patients with Thoracic Aortic Disease. Circulation. 2010; [id number]. Aortic aneurysm NOS (ICD10-I71.9) 2. 2.2 cm low-attenuation right thyroid lobe. Thyroid ultrasound should be considered for further evaluation (in absence of significant comorbidities or limited life expectancy). This follows ACR consensus guidelines: Managing Incidental Thyroid Nodules Detected on Imaging: White Paper of the ACR Incidental Thyroid Findings Committee. J Am Coll Radiol 2015; 12:143-150. Aortic Atherosclerosis (ICD10-I70.0). Electronically Signed   By: Marlaine Hind M.D.   On: 09/14/2019 09:39   Vas US Doppler Pre  Cabg  Result Date: 09/15/2019 PREOPERATIVE VASCULAR EVALUATION  Indications:            Pre op. Risk Factors:           Hypertension, PAD. Vascular Interventions: Hx Fem Fem bypass. Performing Technologist: June Leap Rvt, Rdms  Examination Guidelines: A complete evaluation includes B-mode imaging, spectral Doppler, color Doppler, and power Doppler as needed of all accessible portions of each vessel. Bilateral testing is considered an integral part of a complete examination. Limited examinations for reoccurring indications may be performed as noted.  Right Carotid Findings: +----------+--------+--------+--------+------------+--------+             PSV cm/s EDV cm/s Stenosis Describe     Comments  +----------+--------+--------+--------+------------+--------+  CCA Prox   93       16                                       +----------+--------+--------+--------+------------+--------+  CCA Distal 92       20                                       +----------+--------+--------+--------+------------+--------+  ICA Prox   103      23       1-39%    heterogenous           +----------+--------+--------+--------+------------+--------+  ICA Distal 92  17                                       +----------+--------+--------+--------+------------+--------+ Portions of this table do not appear on this page. +----------+--------+-------+----------------+------------+             PSV cm/s EDV cms Describe         Arm Pressure  +----------+--------+-------+----------------+------------+  Subclavian 148      9       Multiphasic, WNL 129           +----------+--------+-------+----------------+------------+ +---------+--------+--+--------+--+---------+  Vertebral PSV cm/s 56 EDV cm/s 12 Antegrade  +---------+--------+--+--------+--+---------+ Left Carotid Findings: +----------+--------+--------+--------+------------+--------+             PSV cm/s EDV cm/s Stenosis Describe     Comments   +----------+--------+--------+--------+------------+--------+  CCA Prox   112      24                                       +----------+--------+--------+--------+------------+--------+  CCA Distal 76       18                                       +----------+--------+--------+--------+------------+--------+  ICA Prox   100      17       1-39%    heterogenous           +----------+--------+--------+--------+------------+--------+  ICA Distal 96       17                                       +----------+--------+--------+--------+------------+--------+  ECA        112      19                                       +----------+--------+--------+--------+------------+--------+ +----------+--------+--------+----------------+------------+  Subclavian PSV cm/s EDV cm/s Describe         Arm Pressure  +----------+--------+--------+----------------+------------+             88       22       Multiphasic, WNL 131           +----------+--------+--------+----------------+------------+ +---------+--------+--+--------+--+---------+  Vertebral PSV cm/s 36 EDV cm/s 14 Antegrade  +---------+--------+--+--------+--+---------+ Incidental finding: bilateral thyroid nodules with atypical appearance. ABI Findings: +--------+------------------+-----+----------+--------+  Right    Rt Pressure (mmHg) Index Waveform   Comment   +--------+------------------+-----+----------+--------+  Brachial 129                      triphasic            +--------+------------------+-----+----------+--------+  ATA      78                 0.60  monophasic           +--------+------------------+-----+----------+--------+  PTA      79                 0.60  monophasic           +--------+------------------+-----+----------+--------+ +--------+------------------+-----+----------+-------+  Left     Lt Pressure (mmHg) Index Waveform   Comment  +--------+------------------+-----+----------+-------+  Brachial 131                      triphasic            +--------+------------------+-----+----------+-------+  ATA      83                 0.63  monophasic          +--------+------------------+-----+----------+-------+  PTA      80                 0.61  monophasic          +--------+------------------+-----+----------+-------+  Right Doppler Findings: +--------+--------+-----+---------+--------+  Site     Pressure Index Doppler   Comments  +--------+--------+-----+---------+--------+  Brachial 129            triphasic           +--------+--------+-----+---------+--------+  Radial                  triphasic           +--------+--------+-----+---------+--------+  Ulnar                   triphasic           +--------+--------+-----+---------+--------+  Left Doppler Findings: +--------+--------+-----+---------+--------+  Site     Pressure Index Doppler   Comments  +--------+--------+-----+---------+--------+  Brachial 131            triphasic           +--------+--------+-----+---------+--------+  Radial                  triphasic           +--------+--------+-----+---------+--------+  Ulnar                   triphasic           +--------+--------+-----+---------+--------+  Summary: Right Carotid: Velocities in the right ICA are consistent with a 1-39% stenosis. Left Carotid: Velocities in the left ICA are consistent with a 1-39% stenosis. Right ABI: Resting right ankle-brachial index indicates moderate right lower extremity arterial disease. Left ABI: Resting left ankle-brachial index indicates moderate left lower extremity arterial disease.  Bilateral Extremity: Doppler waveforms remain within normal limits with compression bilaterally for the radial arteries. Doppler waveforms remain within normal limits with compression bilaterally for the ulnar arteries.  Electronically signed by Curt Jews MD on 09/15/2019 at 8:47:21 AM.    Final      I have independently reviewed the above radiologic studies and discussed with the patient   Recent Lab Findings: Lab Results    Component Value Date   WBC 10.8 (H) 09/16/2019   HGB 10.7 (L) 09/16/2019   HCT 32.8 (L) 09/16/2019   PLT 164 09/16/2019   GLUCOSE 136 (H) 09/16/2019   CHOL 134 09/11/2019   TRIG 49 09/11/2019   HDL 32 (L) 09/11/2019   LDLDIRECT 168.7 09/22/2007   LDLCALC 92 09/11/2019   ALT 15 09/11/2019   AST 21 09/11/2019   NA 138 09/16/2019   K 4.6 09/16/2019   CL 109 09/16/2019   CREATININE 1.49 (H) 09/16/2019   BUN 35 (H) 09/16/2019   CO2 20 (L) 09/16/2019   TSH 1.38 07/19/2013   INR 1.1 09/14/2019   HGBA1C 6.5 (H) 09/15/2019      Assessment / Plan:      Very  pleasant 85 yo man who lives independently. He has coronary anatomy best suited for CABG; his work-up shows relatively small risk for perioperative morbidity or mortality despite advanced age. After fully explaining risks and benefits with the patient and sons, we have decided for CABG on 09/16/19.     I  spent 40 minutes counseling the patient face to face.   Kathrynn Backstrom Z. Orvan Seen, MD [phone] 09/16/2019 7:11 AM

## 2019-09-16 NOTE — Anesthesia Procedure Notes (Signed)
Central Venous Catheter Insertion Performed by: Belinda Block, MD, anesthesiologist Start/End11/27/2020 6:50 AM, 09/16/2019 7:05 AM Preanesthetic checklist: patient identified, IV checked, site marked, risks and benefits discussed, surgical consent, monitors and equipment checked, pre-op evaluation and timeout performed Position: Trendelenburg Lidocaine 1% used for infiltration and patient sedated Hand hygiene performed , maximum sterile barriers used  and Seldinger technique used PA cath was placed.Sheath introducer Swan type:thermodilution Procedure performed using ultrasound guided technique. Ultrasound Notes:anatomy identified Attempts: 1 Following insertion, line sutured, dressing applied and Biopatch. Patient tolerated the procedure well with no immediate complications.

## 2019-09-16 NOTE — Procedures (Signed)
Extubation Procedure Note  Patient Details:   Name: Brandon Lewis DOB: 06-Aug-1934 MRN: [id number]   Airway Documentation:    Vent end date: 09/16/19 Vent end time: 1805   Evaluation  O2 sats: stable throughout Complications: No apparent complications Patient did tolerate procedure well. Bilateral Breath Sounds: Clear, Diminished   Yes   Pt extubated at 1805 per rapid wean protocol. Pt had a positive cuff leak and was suctioned prior to extubation. Pt tolerated procedure well and is now on 2L [place] with a saturation of 100%. Pt was able to say his full name and his location afterwards. Pt NIF -20 and VC was 0.9L. Pt able to clear secretions well. RT will continue to monitor pt status.   Krysti Hickling A Mckinze Poirier 09/16/2019, 6:11 PM

## 2019-09-16 NOTE — Anesthesia Procedure Notes (Signed)
Arterial Line Insertion Start/End11/27/2020 7:00 AM, 09/16/2019 7:10 AM Performed by: Effie Berkshire, MD, Janene Harvey, CRNA, CRNA  Patient location: Pre-op. Preanesthetic checklist: patient identified, IV checked, risks and benefits discussed and pre-op evaluation Lidocaine 1% used for infiltration and patient sedated Left, radial was placed Catheter size: 20 G Hand hygiene performed , maximum sterile barriers used  and Seldinger technique used Allen's test indicative of satisfactory collateral circulation Attempts: 2 Procedure performed without using ultrasound guided technique. Following insertion, dressing applied and Biopatch. Patient tolerated the procedure well with no immediate complications.

## 2019-09-16 NOTE — Anesthesia Procedure Notes (Signed)
Procedure Name: Intubation Date/Time: 09/16/2019 7:47 AM Performed by: Janene Harvey, CRNA Pre-anesthesia Checklist: Patient identified, Emergency Drugs available, Suction available and Patient being monitored Patient Re-evaluated:Patient Re-evaluated prior to induction Oxygen Delivery Method: Circle system utilized Preoxygenation: Pre-oxygenation with 100% oxygen Induction Type: IV induction Ventilation: Mask ventilation without difficulty and Oral airway inserted - appropriate to patient size Laryngoscope Size: Mac and 4 Grade View: Grade III Tube type: Oral Tube size: 8.0 mm Number of attempts: 1 Airway Equipment and Method: Stylet and Oral airway Placement Confirmation: ETT inserted through vocal cords under direct vision,  positive ETCO2 and breath sounds checked- equal and bilateral Secured at: 23 cm Tube secured with: Tape Dental Injury: Teeth and Oropharynx as per pre-operative assessment

## 2019-09-16 NOTE — Anesthesia Preprocedure Evaluation (Addendum)
Anesthesia Evaluation  Patient identified by MRN, date of birth, ID band Patient awake    Reviewed: Allergy & Precautions, NPO status , Patient's Chart, lab work & pertinent test results  Airway Mallampati: III  TM Distance: >3 FB Neck ROM: Full    Dental  (+) Teeth Intact, Dental Advisory Given   Pulmonary former smoker,    breath sounds clear to auscultation       Cardiovascular hypertension, Pt. on home beta blockers + CAD, + Past MI and + Peripheral Vascular Disease   Rhythm:Regular Rate:Normal     Neuro/Psych negative neurological ROS     GI/Hepatic Neg liver ROS, GERD  ,  Endo/Other  negative endocrine ROS  Renal/GU Renal InsufficiencyRenal disease     Musculoskeletal  (+) Arthritis ,   Abdominal Normal abdominal exam  (+) - obese,   Peds  Hematology negative hematology ROS (+)   Anesthesia Other Findings   Reproductive/Obstetrics                           Echo:  1. Left ventricular ejection fraction, by visual estimation, is 50 to 55%. The left ventricle has normal function. There is no left ventricular hypertrophy.  2. Global right ventricle has normal systolic function.The right ventricular size is normal. No increase in right ventricular wall thickness.  3. Left atrial size was normal.  4. Right atrial size was normal.  5. The mitral valve is normal in structure. Mild mitral valve regurgitation.  6. The tricuspid valve is normal in structure. Tricuspid valve regurgitation is mild.  7. The aortic valve is normal in structure. Aortic valve regurgitation is not visualized.  8. The pulmonic valve was grossly normal. Pulmonic valve regurgitation is trivial.  9. Mildly elevated pulmonary artery systolic pressure.   Anesthesia Physical Anesthesia Plan  ASA: IV  Anesthesia Plan: General   Post-op Pain Management:    Induction: Intravenous  PONV Risk Score and Plan: 2 and  Ondansetron and Treatment may vary due to age or medical condition  Airway Management Planned: Oral ETT  Additional Equipment: Arterial line, CVP, PA Cath, TEE and Ultrasound Guidance Line Placement  Intra-op Plan:   Post-operative Plan: Post-operative intubation/ventilation  Informed Consent:   Plan Discussed with: CRNA  Anesthesia Plan Comments:         Anesthesia Quick Evaluation

## 2019-09-17 ENCOUNTER — Inpatient Hospital Stay (HOSPITAL_COMMUNITY): Payer: Medicare Other

## 2019-09-17 LAB — BASIC METABOLIC PANEL
Anion gap: 9 (ref 5–15)
Anion gap: 7 (ref 5–15)
BUN: 31 mg/dL — ABNORMAL HIGH (ref 8–23)
BUN: 36 mg/dL — ABNORMAL HIGH (ref 8–23)
CO2: 21 mmol/L — ABNORMAL LOW (ref 22–32)
CO2: 21 mmol/L — ABNORMAL LOW (ref 22–32)
Calcium: 8.6 mg/dL — ABNORMAL LOW (ref 8.9–10.3)
Calcium: 8.6 mg/dL — ABNORMAL LOW (ref 8.9–10.3)
Chloride: 108 mmol/L (ref 98–111)
Chloride: 107 mmol/L (ref 98–111)
Creatinine, Ser: 1.51 mg/dL — ABNORMAL HIGH (ref 0.61–1.24)
Creatinine, Ser: 1.87 mg/dL — ABNORMAL HIGH (ref 0.61–1.24)
GFR calc Af Amer: 49 mL/min — ABNORMAL LOW (ref >60)
GFR calc Af Amer: 38 mL/min — ABNORMAL LOW (ref >60)
GFR calc non Af Amer: 42 mL/min — ABNORMAL LOW (ref >60)
GFR calc non Af Amer: 33 mL/min — ABNORMAL LOW (ref >60)
Glucose, Bld: 138 mg/dL — ABNORMAL HIGH (ref 70–99)
Glucose, Bld: 212 mg/dL — ABNORMAL HIGH (ref 70–99)
Potassium: 4.9 mmol/L (ref 3.5–5.1)
Potassium: 5.3 mmol/L — ABNORMAL HIGH (ref 3.5–5.1)
Sodium: 138 mmol/L (ref 135–145)
Sodium: 135 mmol/L (ref 135–145)

## 2019-09-17 LAB — GLUCOSE, CAPILLARY
Glucose-Capillary: 109 mg/dL — ABNORMAL HIGH (ref 70–99)
Glucose-Capillary: 110 mg/dL — ABNORMAL HIGH (ref 70–99)
Glucose-Capillary: 112 mg/dL — ABNORMAL HIGH (ref 70–99)
Glucose-Capillary: 113 mg/dL — ABNORMAL HIGH (ref 70–99)
Glucose-Capillary: 115 mg/dL — ABNORMAL HIGH (ref 70–99)
Glucose-Capillary: 130 mg/dL — ABNORMAL HIGH (ref 70–99)
Glucose-Capillary: 136 mg/dL — ABNORMAL HIGH (ref 70–99)
Glucose-Capillary: 140 mg/dL — ABNORMAL HIGH (ref 70–99)
Glucose-Capillary: 150 mg/dL — ABNORMAL HIGH (ref 70–99)
Glucose-Capillary: 169 mg/dL — ABNORMAL HIGH (ref 70–99)
Glucose-Capillary: 173 mg/dL — ABNORMAL HIGH (ref 70–99)
Glucose-Capillary: 176 mg/dL — ABNORMAL HIGH (ref 70–99)
Glucose-Capillary: 192 mg/dL — ABNORMAL HIGH (ref 70–99)
Glucose-Capillary: 199 mg/dL — ABNORMAL HIGH (ref 70–99)

## 2019-09-17 LAB — CBC
HCT: 27.3 % — ABNORMAL LOW (ref 39.0–52.0)
HCT: 27.7 % — ABNORMAL LOW (ref 39.0–52.0)
Hemoglobin: 9 g/dL — ABNORMAL LOW (ref 13.0–17.0)
Hemoglobin: 8.8 g/dL — ABNORMAL LOW (ref 13.0–17.0)
MCH: 34.9 pg — ABNORMAL HIGH (ref 26.0–34.0)
MCH: 34.5 pg — ABNORMAL HIGH (ref 26.0–34.0)
MCHC: 33 g/dL (ref 30.0–36.0)
MCHC: 31.8 g/dL (ref 30.0–36.0)
MCV: 105.8 fL — ABNORMAL HIGH (ref 80.0–100.0)
MCV: 108.6 fL — ABNORMAL HIGH (ref 80.0–100.0)
Platelets: 131 10*3/uL — ABNORMAL LOW (ref 150–400)
Platelets: UNDETERMINED 10*3/uL (ref 150–400)
RBC: 2.58 MIL/uL — ABNORMAL LOW (ref 4.22–5.81)
RBC: 2.55 MIL/uL — ABNORMAL LOW (ref 4.22–5.81)
RDW: 24.1 % — ABNORMAL HIGH (ref 11.5–15.5)
RDW: 25 % — ABNORMAL HIGH (ref 11.5–15.5)
WBC: 16.5 10*3/uL — ABNORMAL HIGH (ref 4.0–10.5)
WBC: 15.9 10*3/uL — ABNORMAL HIGH (ref 4.0–10.5)
nRBC: 0.1 % (ref 0.0–0.2)
nRBC: 0 % (ref 0.0–0.2)

## 2019-09-17 LAB — MAGNESIUM
Magnesium: 2.7 mg/dL — ABNORMAL HIGH (ref 1.7–2.4)
Magnesium: 2.4 mg/dL (ref 1.7–2.4)

## 2019-09-17 MED ORDER — INSULIN ASPART 100 UNIT/ML ~~LOC~~ SOLN
0.0000 [IU] | SUBCUTANEOUS | Status: DC
  Administered 2019-09-17 (×4): 4 [IU] via SUBCUTANEOUS
  Administered 2019-09-18 – 2019-09-22 (×23): 2 [IU] via SUBCUTANEOUS
  Administered 2019-09-23: 8 [IU] via SUBCUTANEOUS
  Administered 2019-09-23 – 2019-09-24 (×2): 2 [IU] via SUBCUTANEOUS
  Administered 2019-09-24: 4 [IU] via SUBCUTANEOUS

## 2019-09-17 MED ORDER — ENOXAPARIN SODIUM 30 MG/0.3ML ~~LOC~~ SOLN
30.0000 mg | Freq: Every day | SUBCUTANEOUS | Status: DC
  Administered 2019-09-17 – 2019-09-27 (×11): 30 mg via SUBCUTANEOUS
  Filled 2019-09-17 (×11): qty 0.3

## 2019-09-17 MED ORDER — ORAL CARE MOUTH RINSE
15.0000 mL | Freq: Two times a day (BID) | OROMUCOSAL | Status: DC
  Administered 2019-09-17 – 2019-10-01 (×16): 15 mL via OROMUCOSAL

## 2019-09-17 MED ORDER — CLOPIDOGREL BISULFATE 75 MG PO TABS
75.0000 mg | ORAL_TABLET | Freq: Every day | ORAL | Status: DC
  Administered 2019-09-17 – 2019-09-20 (×4): 75 mg via ORAL
  Filled 2019-09-17 (×5): qty 1

## 2019-09-17 MED ORDER — THIAMINE HCL 100 MG/ML IJ SOLN
Freq: Once | INTRAVENOUS | Status: AC
  Administered 2019-09-17: 11:00:00 via INTRAVENOUS
  Filled 2019-09-17: qty 1000

## 2019-09-17 NOTE — Progress Notes (Signed)
Large blood clots noted in foley catheter tubing.  Will keep foley in until I can d/w Dr. Kipp Brood in rounds.

## 2019-09-17 NOTE — Progress Notes (Signed)
[address]       [place] [address]             [phone]                 1 Day Post-Op Procedure(s) (LRB): CORONARY ARTERY BYPASS GRAFTING (CABG) using LIMA to LAD; Endoscopic right saphenous vein harvest to OM1, Diag1, and RCA. (N/A) TRANSESOPHAGEAL ECHOCARDIOGRAM (TEE) (N/A) INDOCYANINE GREEN FLUORESCENCE IMAGING (ICG) (N/A)   Events: No events, extubated yesterday _______________________________________________________________ Vitals: BP 118/62   Pulse 92   Temp (!) 97 F (36.1 C) (Core)   Resp (!) 21   Ht 5\' 10"  (1.778 m)   Wt 95.1 kg   SpO2 97%   BMI 30.08 kg/m   - Neuro: alert NAD   - Cardiovascular: sinus.  Good hemodynmanics.  Peripherally warm  Drips: none.   PAP: (23-46)/(11-27) 27/14 CVP:  [6 mmHg-21 mmHg] 11 mmHg CO:  [2.7 L/min-5.7 L/min] 4.9 L/min CI:  [1.3 L/min/m2-2.8 L/min/m2] 2.4 L/min/m2  - Pulm: EWOB  ABG    Component Value Date/Time   PHART 7.313 (L) 09/16/2019 1922   PCO2ART 31.9 (L) 09/16/2019 1922   PO2ART 86.0 09/16/2019 1922   HCO3 16.2 (L) 09/16/2019 1922   TCO2 17 (L) 09/16/2019 1922   ACIDBASEDEF 9.0 (H) 09/16/2019 1922   O2SAT 96.0 09/16/2019 1922    - Abd: soft - Extremity: warm  .Intake/Output      11/27 0701 - 11/28 0700 11/28 0701 - 11/29 0700   P.O. 740    I.V. (mL/kg) 4583.8 (48.2) 56.9 (0.6)   Blood 765    Other 10    IV Piggyback 1130.5    Total Intake(mL/kg) 7229.3 (76) 56.9 (0.6)   Urine (mL/kg/hr) 1595 (0.7) 40 (0.2)   Blood 1000    Chest Tube 910 10   Total Output 3505 50   Net +3724.3 +6.9        Urine Occurrence 1 x       _______________________________________________________________ Labs: CBC Latest Ref Rng & Units 09/17/2019 09/16/2019 09/16/2019  WBC 4.0 - 10.5 K/uL 16.5(H) 20.3(H) -  Hemoglobin 13.0 - 17.0 g/dL 9.0(L) 8.7(L) 8.5(L)  Hematocrit 39.0 - 52.0 % 27.3(L) 26.5(L) 25.0(L)  Platelets 150 - 400 K/uL 131(L) PLATELET CLUMPS NOTED ON SMEAR, COUNT APPEARS  ADEQUATE -   CMP Latest Ref Rng & Units 09/17/2019 09/16/2019 09/16/2019  Glucose 70 - 99 mg/dL 138(H) 216(H) -  BUN 8 - 23 mg/dL 31(H) 31(H) -  Creatinine 0.61 - 1.24 mg/dL 1.51(H) 1.37(H) -  Sodium 135 - 145 mmol/L 138 137 139  Potassium 3.5 - 5.1 mmol/L 4.9 5.3(H) 5.2(H)  Chloride 98 - 111 mmol/L 108 110 -  CO2 22 - 32 mmol/L 21(L) 17(L) -  Calcium 8.9 - 10.3 mg/dL 8.6(L) 8.6(L) -  Total Protein 6.5 - 8.1 g/dL - - -  Total Bilirubin 0.3 - 1.2 mg/dL - - -  Alkaline Phos 38 - 126 U/L - - -  AST 15 - 41 U/L - - -  ALT 0 - 44 U/L - - -    CXR: PV congestion.    _______________________________________________________________  Assessment and Plan: POD 1 s/p CABG doing well  Neuro: pain controlled CV: on A/S/BB.  Off levo.  Pacer decreased to back-up of 60.  Will remove swan.  Will remove A line later today Pulm: pulmt toilet Renal: good uop.  Will follow creat.  Will likely diurese tomorrow GI: advancing diet Heme: stable  ID: afebrile, will follow leukocytosis Endo: SSI Dispo: continue ICU care  Melodie Bouillon, MD 09/17/2019 9:25 AM

## 2019-09-18 ENCOUNTER — Inpatient Hospital Stay (HOSPITAL_COMMUNITY): Payer: Medicare Other

## 2019-09-18 LAB — GLUCOSE, CAPILLARY
Glucose-Capillary: 149 mg/dL — ABNORMAL HIGH (ref 70–99)
Glucose-Capillary: 156 mg/dL — ABNORMAL HIGH (ref 70–99)
Glucose-Capillary: 154 mg/dL — ABNORMAL HIGH (ref 70–99)
Glucose-Capillary: 142 mg/dL — ABNORMAL HIGH (ref 70–99)
Glucose-Capillary: 145 mg/dL — ABNORMAL HIGH (ref 70–99)

## 2019-09-18 LAB — CBC
HCT: 25.7 % — ABNORMAL LOW (ref 39.0–52.0)
Hemoglobin: 8.2 g/dL — ABNORMAL LOW (ref 13.0–17.0)
MCH: 35.2 pg — ABNORMAL HIGH (ref 26.0–34.0)
MCHC: 31.9 g/dL (ref 30.0–36.0)
MCV: 110.3 fL — ABNORMAL HIGH (ref 80.0–100.0)
Platelets: 101 10*3/uL — ABNORMAL LOW (ref 150–400)
RBC: 2.33 MIL/uL — ABNORMAL LOW (ref 4.22–5.81)
RDW: 24.2 % — ABNORMAL HIGH (ref 11.5–15.5)
WBC: 15.7 10*3/uL — ABNORMAL HIGH (ref 4.0–10.5)
nRBC: 0.2 % (ref 0.0–0.2)

## 2019-09-18 LAB — BASIC METABOLIC PANEL
Anion gap: 8 (ref 5–15)
BUN: 40 mg/dL — ABNORMAL HIGH (ref 8–23)
CO2: 20 mmol/L — ABNORMAL LOW (ref 22–32)
Calcium: 8.4 mg/dL — ABNORMAL LOW (ref 8.9–10.3)
Chloride: 107 mmol/L (ref 98–111)
Creatinine, Ser: 1.86 mg/dL — ABNORMAL HIGH (ref 0.61–1.24)
GFR calc Af Amer: 38 mL/min — ABNORMAL LOW (ref >60)
GFR calc non Af Amer: 33 mL/min — ABNORMAL LOW (ref >60)
Glucose, Bld: 159 mg/dL — ABNORMAL HIGH (ref 70–99)
Potassium: 5.3 mmol/L — ABNORMAL HIGH (ref 3.5–5.1)
Sodium: 135 mmol/L (ref 135–145)

## 2019-09-18 MED ORDER — FUROSEMIDE 10 MG/ML IJ SOLN
40.0000 mg | Freq: Two times a day (BID) | INTRAMUSCULAR | Status: DC
  Administered 2019-09-18 (×2): 40 mg via INTRAVENOUS
  Filled 2019-09-18 (×2): qty 4

## 2019-09-18 MED ORDER — TAMSULOSIN HCL 0.4 MG PO CAPS
0.4000 mg | ORAL_CAPSULE | Freq: Every day | ORAL | Status: DC
  Administered 2019-09-18 – 2019-10-02 (×15): 0.4 mg via ORAL
  Filled 2019-09-18 (×15): qty 1

## 2019-09-18 NOTE — Progress Notes (Signed)
      [address]       [place] [address]             [phone]                 2 Days Post-Op Procedure(s) (LRB): CORONARY ARTERY BYPASS GRAFTING (CABG) using LIMA to LAD; Endoscopic right saphenous vein harvest to OM1, Diag1, and RCA. (N/A) TRANSESOPHAGEAL ECHOCARDIOGRAM (TEE) (N/A) INDOCYANINE GREEN FLUORESCENCE IMAGING (ICG) (N/A)   Events: No events, extubated yesterday _______________________________________________________________ Vitals: BP 99/85   Pulse 98   Temp 98 F (36.7 C) (Axillary)   Resp (!) 26   Ht 5\' 10"  (1.778 m)   Wt 93.5 kg   SpO2 93%   BMI 29.58 kg/m   - Neuro: alert NAD   - Cardiovascular: sinus.  Good hemodynmanics.  Peripherally warm  Drips: none.      - Pulm: EWOB  ABG    Component Value Date/Time   PHART 7.313 (L) 09/16/2019 1922   PCO2ART 31.9 (L) 09/16/2019 1922   PO2ART 86.0 09/16/2019 1922   HCO3 16.2 (L) 09/16/2019 1922   TCO2 17 (L) 09/16/2019 1922   ACIDBASEDEF 9.0 (H) 09/16/2019 1922   O2SAT 96.0 09/16/2019 1922    - Abd: soft - Extremity: warm  .Intake/Output      11/28 0701 - 11/29 0700 11/29 0701 - 11/30 0700   P.O. 660    I.V. (mL/kg) 1059.7 (11.3)    Blood     Other     IV Piggyback 200    Total Intake(mL/kg) 1919.7 (20.5)    Urine (mL/kg/hr) 540 (0.2)    Blood     Chest Tube 500    Total Output 1040    Net +879.7            _______________________________________________________________ Labs: CBC Latest Ref Rng & Units 09/18/2019 09/17/2019 09/17/2019  WBC 4.0 - 10.5 K/uL 15.7(H) 15.9(H) 16.5(H)  Hemoglobin 13.0 - 17.0 g/dL 8.2(L) 8.8(L) 9.0(L)  Hematocrit 39.0 - 52.0 % 25.7(L) 27.7(L) 27.3(L)  Platelets 150 - 400 K/uL 101(L) PLATELET CLUMPS NOTED ON SMEAR, UNABLE TO ESTIMATE 131(L)   CMP Latest Ref Rng & Units 09/18/2019 09/17/2019 09/17/2019  Glucose 70 - 99 mg/dL 159(H) 212(H) 138(H)  BUN 8 - 23 mg/dL 40(H) 36(H) 31(H)  Creatinine 0.61 - 1.24 mg/dL 1.86(H) 1.87(H) 1.51(H)   Sodium 135 - 145 mmol/L 135 135 138  Potassium 3.5 - 5.1 mmol/L 5.3(H) 5.3(H) 4.9  Chloride 98 - 111 mmol/L 107 107 108  CO2 22 - 32 mmol/L 20(L) 21(L) 21(L)  Calcium 8.9 - 10.3 mg/dL 8.4(L) 8.6(L) 8.6(L)  Total Protein 6.5 - 8.1 g/dL - - -  Total Bilirubin 0.3 - 1.2 mg/dL - - -  Alkaline Phos 38 - 126 U/L - - -  AST 15 - 41 U/L - - -  ALT 0 - 44 U/L - - -    CXR: PV congestion.    _______________________________________________________________  Assessment and Plan: POD 2 s/p CABG doing well  Neuro: pain controlled CV: on A/S/BB. Will remove wires.  Will keep CT given output Pulm: pulmt toilet Renal: poor uop.  Creat up.  Lasix already ordered GI: advancing diet Heme: stable ID: afebrile, will follow leukocytosis Endo: SSI Dispo: continue ICU care  Melodie Bouillon, MD 09/18/2019 9:35 AM

## 2019-09-18 NOTE — Progress Notes (Signed)
Dropped Tramadol 50 mg on floor of patient's room. Wasted tablet with Lenord Fellers RN in stericycle.

## 2019-09-19 ENCOUNTER — Inpatient Hospital Stay (HOSPITAL_COMMUNITY): Payer: Medicare Other

## 2019-09-19 ENCOUNTER — Inpatient Hospital Stay: Payer: Self-pay

## 2019-09-19 ENCOUNTER — Encounter: Payer: Medicare Other | Admitting: Internal Medicine

## 2019-09-19 ENCOUNTER — Encounter (HOSPITAL_COMMUNITY): Payer: Self-pay | Admitting: Cardiothoracic Surgery

## 2019-09-19 DIAGNOSIS — I255 Ischemic cardiomyopathy: Secondary | ICD-10-CM

## 2019-09-19 DIAGNOSIS — I25110 Atherosclerotic heart disease of native coronary artery with unstable angina pectoris: Secondary | ICD-10-CM

## 2019-09-19 DIAGNOSIS — E785 Hyperlipidemia, unspecified: Secondary | ICD-10-CM

## 2019-09-19 DIAGNOSIS — D62 Acute posthemorrhagic anemia: Secondary | ICD-10-CM

## 2019-09-19 LAB — CBC WITH DIFFERENTIAL/PLATELET
Abs Immature Granulocytes: 0.15 10*3/uL — ABNORMAL HIGH (ref 0.00–0.07)
Basophils Absolute: 0 10*3/uL (ref 0.0–0.1)
Basophils Relative: 0 %
Eosinophils Absolute: 0 10*3/uL (ref 0.0–0.5)
Eosinophils Relative: 0 %
HCT: 23.7 % — ABNORMAL LOW (ref 39.0–52.0)
Hemoglobin: 7.6 g/dL — ABNORMAL LOW (ref 13.0–17.0)
Immature Granulocytes: 1 %
Lymphocytes Relative: 10 %
Lymphs Abs: 1.4 10*3/uL (ref 0.7–4.0)
MCH: 34.9 pg — ABNORMAL HIGH (ref 26.0–34.0)
MCHC: 32.1 g/dL (ref 30.0–36.0)
MCV: 108.7 fL — ABNORMAL HIGH (ref 80.0–100.0)
Monocytes Absolute: 1.9 10*3/uL — ABNORMAL HIGH (ref 0.1–1.0)
Monocytes Relative: 14 %
Neutro Abs: 10.2 10*3/uL — ABNORMAL HIGH (ref 1.7–7.7)
Neutrophils Relative %: 75 %
Platelets: 105 10*3/uL — ABNORMAL LOW (ref 150–400)
RBC: 2.18 MIL/uL — ABNORMAL LOW (ref 4.22–5.81)
RDW: 23.5 % — ABNORMAL HIGH (ref 11.5–15.5)
WBC: 13.7 10*3/uL — ABNORMAL HIGH (ref 4.0–10.5)
nRBC: 0.4 % — ABNORMAL HIGH (ref 0.0–0.2)

## 2019-09-19 LAB — TYPE AND SCREEN
ABO/RH(D): O POS
Antibody Screen: NEGATIVE
Unit division: 0
Unit division: 0
Unit division: 0
Unit division: 0
Unit division: 0
Unit division: 0

## 2019-09-19 LAB — BASIC METABOLIC PANEL
Anion gap: 6 (ref 5–15)
Anion gap: 10 (ref 5–15)
BUN: 55 mg/dL — ABNORMAL HIGH (ref 8–23)
BUN: 58 mg/dL — ABNORMAL HIGH (ref 8–23)
CO2: 22 mmol/L (ref 22–32)
CO2: 22 mmol/L (ref 22–32)
Calcium: 8.7 mg/dL — ABNORMAL LOW (ref 8.9–10.3)
Calcium: 8.8 mg/dL — ABNORMAL LOW (ref 8.9–10.3)
Chloride: 105 mmol/L (ref 98–111)
Chloride: 101 mmol/L (ref 98–111)
Creatinine, Ser: 2.4 mg/dL — ABNORMAL HIGH (ref 0.61–1.24)
Creatinine, Ser: 2.3 mg/dL — ABNORMAL HIGH (ref 0.61–1.24)
GFR calc Af Amer: 28 mL/min — ABNORMAL LOW (ref >60)
GFR calc Af Amer: 29 mL/min — ABNORMAL LOW (ref >60)
GFR calc non Af Amer: 24 mL/min — ABNORMAL LOW (ref >60)
GFR calc non Af Amer: 25 mL/min — ABNORMAL LOW (ref >60)
Glucose, Bld: 142 mg/dL — ABNORMAL HIGH (ref 70–99)
Glucose, Bld: 148 mg/dL — ABNORMAL HIGH (ref 70–99)
Potassium: 4.9 mmol/L (ref 3.5–5.1)
Potassium: 4.8 mmol/L (ref 3.5–5.1)
Sodium: 133 mmol/L — ABNORMAL LOW (ref 135–145)
Sodium: 133 mmol/L — ABNORMAL LOW (ref 135–145)

## 2019-09-19 LAB — BPAM RBC
Blood Product Expiration Date: 202012252359
Blood Product Expiration Date: 202012252359
Blood Product Expiration Date: 202012272359
Blood Product Expiration Date: 202012272359
Blood Product Expiration Date: 202012272359
Blood Product Expiration Date: 202012282359
ISSUE DATE / TIME: 202011271013
ISSUE DATE / TIME: 202011271013
ISSUE DATE / TIME: 202011271051
ISSUE DATE / TIME: 202011272137
Unit Type and Rh: 5100
Unit Type and Rh: 5100
Unit Type and Rh: 5100
Unit Type and Rh: 5100
Unit Type and Rh: 5100
Unit Type and Rh: 5100

## 2019-09-19 LAB — ECHOCARDIOGRAM LIMITED
Height: 70 in
Weight: 3167.57 oz

## 2019-09-19 LAB — GLUCOSE, CAPILLARY
Glucose-Capillary: 135 mg/dL — ABNORMAL HIGH (ref 70–99)
Glucose-Capillary: 136 mg/dL — ABNORMAL HIGH (ref 70–99)
Glucose-Capillary: 144 mg/dL — ABNORMAL HIGH (ref 70–99)
Glucose-Capillary: 152 mg/dL — ABNORMAL HIGH (ref 70–99)
Glucose-Capillary: 154 mg/dL — ABNORMAL HIGH (ref 70–99)
Glucose-Capillary: 160 mg/dL — ABNORMAL HIGH (ref 70–99)

## 2019-09-19 LAB — PREPARE RBC (CROSSMATCH)

## 2019-09-19 MED ORDER — SODIUM CHLORIDE 0.9% IV SOLUTION
Freq: Once | INTRAVENOUS | Status: AC
  Administered 2019-09-19: 11:00:00 via INTRAVENOUS

## 2019-09-19 MED ORDER — DOPAMINE-DEXTROSE 3.2-5 MG/ML-% IV SOLN
1.0000 ug/kg/min | INTRAVENOUS | Status: DC
  Administered 2019-09-19: 2.5 ug/kg/min via INTRAVENOUS
  Filled 2019-09-19: qty 250

## 2019-09-19 MED ORDER — LEVALBUTEROL HCL 0.63 MG/3ML IN NEBU
0.6300 mg | INHALATION_SOLUTION | Freq: Four times a day (QID) | RESPIRATORY_TRACT | Status: AC
  Administered 2019-09-19 – 2019-09-21 (×5): 0.63 mg via RESPIRATORY_TRACT
  Filled 2019-09-19 (×6): qty 3

## 2019-09-19 MED ORDER — FUROSEMIDE 10 MG/ML IJ SOLN
4.0000 mg/h | INTRAVENOUS | Status: DC
  Filled 2019-09-19: qty 25

## 2019-09-19 NOTE — Progress Notes (Signed)
3 Days Post-Op Procedure(s) (LRB): CORONARY ARTERY BYPASS GRAFTING (CABG) using LIMA to LAD; Endoscopic right saphenous vein harvest to OM1, Diag1, and RCA. (N/A) TRANSESOPHAGEAL ECHOCARDIOGRAM (TEE) (N/A) INDOCYANINE GREEN FLUORESCENCE IMAGING (ICG) (N/A) Subjective: Still a little sore  Objective: Vital signs in last 24 hours: Temp:  [97.8 F (36.6 C)-98.7 F (37.1 C)] 97.8 F (36.6 C) (11/30 0400) Pulse Rate:  [84-100] 100 (11/30 0610) Cardiac Rhythm: Normal sinus rhythm (11/30 0400) Resp:  [12-28] 19 (11/30 0610) BP: (82-132)/(50-80) 109/62 (11/30 0610) SpO2:  [91 %-100 %] 97 % (11/30 0610) Weight:  [89.8 kg] 89.8 kg (11/30 0610)  Hemodynamic parameters for last 24 hours:    Intake/Output from previous day: 11/29 0701 - 11/30 0700 In: [id number] [P.O.:1020; I.V.:3] Out: 1545 [Urine:1335; Chest Tube:210] Intake/Output this shift: No intake/output data recorded.  General appearance: alert, cooperative and no distress Neurologic: intact Heart: regular rate and rhythm, S1, S2 normal, no murmur, click, rub or gallop Lungs: clear to auscultation bilaterally Abdomen: soft, non-tender; bowel sounds normal; no masses,  no organomegaly Extremities: edema mild Wound: c/d/i  Lab Results: Recent Labs    09/18/19 0413 09/19/19 0319  WBC 15.7* 13.7*  HGB 8.2* 7.6*  HCT 25.7* 23.7*  PLT 101* 105*   BMET:  Recent Labs    09/18/19 0413 09/19/19 0319  NA 135 133*  K 5.3* 4.9  CL 107 105  CO2 20* 22  GLUCOSE 159* 142*  BUN 40* 55*  CREATININE 1.86* 2.40*  CALCIUM 8.4* 8.7*    PT/INR:  Recent Labs    09/16/19 1351  LABPROT 16.4*  INR 1.3*   ABG    Component Value Date/Time   PHART 7.313 (L) 09/16/2019 1922   HCO3 16.2 (L) 09/16/2019 1922   TCO2 17 (L) 09/16/2019 1922   ACIDBASEDEF 9.0 (H) 09/16/2019 1922   O2SAT 96.0 09/16/2019 1922   CBG (last 3)  Recent Labs    09/18/19 2023 09/19/19 0024 09/19/19 0452  GLUCAP 145* 160* 135*    Assessment/Plan: S/P  Procedure(s) (LRB): CORONARY ARTERY BYPASS GRAFTING (CABG) using LIMA to LAD; Endoscopic right saphenous vein harvest to OM1, Diag1, and RCA. (N/A) TRANSESOPHAGEAL ECHOCARDIOGRAM (TEE) (N/A) INDOCYANINE GREEN FLUORESCENCE IMAGING (ICG) (N/A) Mobilize Diuresis echo to assess LV function given rising sCr  DA gtt Switch to lasix gtt    LOS: 7 days    Wonda Olds 09/19/2019

## 2019-09-19 NOTE — Progress Notes (Signed)
Atkins MD ordered to change dopamine gtt to 3 mL/hr.

## 2019-09-19 NOTE — Progress Notes (Signed)
  Echocardiogram 2D Echocardiogram has been performed.  Burnett Kanaris 09/19/2019, 8:38 AM

## 2019-09-19 NOTE — Progress Notes (Signed)
Progress Note  Patient Name: Brandon Lewis Date of Encounter: 09/19/2019  Primary Cardiologist: Corey Skains, MD  Subjective   Very hard of hearing, but conversational this AM. Doing well overall. Sore, had hiccups during interview which were painful but better with bracing with pillow. Breathing is good. Very thankful for his surgery and overall care. Tolerating food. Pain well controlled.  Inpatient Medications    Scheduled Meds:  sodium chloride   Intravenous Once   acetaminophen  1,000 mg Oral Q6H   Or   acetaminophen (TYLENOL) oral liquid 160 mg/5 mL  1,000 mg Per Tube Q6H   aspirin EC  81 mg Oral Daily   bisacodyl  10 mg Oral Daily   Or   bisacodyl  10 mg Rectal Daily   Chlorhexidine Gluconate Cloth  6 each Topical Daily   clopidogrel  75 mg Oral Daily   colchicine  0.3 mg Oral BID   docusate sodium  200 mg Oral Daily   enoxaparin (LOVENOX) injection  30 mg Subcutaneous QHS   insulin aspart  0-24 Units Subcutaneous Q4H   mouth rinse  15 mL Mouth Rinse BID   multivitamins with iron  1 tablet Oral Daily   pantoprazole  40 mg Oral Daily   pneumococcal 23 valent vaccine  0.5 mL Intramuscular Once   rosuvastatin  20 mg Oral q1800   sodium chloride flush  3 mL Intravenous Q12H   tamsulosin  0.4 mg Oral Daily   Continuous Infusions:  sodium chloride     DOPamine 2.5 mcg/kg/min (09/19/19 0814)   lactated ringers     lactated ringers Stopped (09/17/19 1156)   lactated ringers Stopped (09/17/19 1237)   PRN Meds: lactated ringers, metoprolol tartrate, ondansetron (ZOFRAN) IV, oxyCODONE, sodium chloride flush, traMADol   Vital Signs    Vitals:   09/19/19 0500 09/19/19 0610 09/19/19 0700 09/19/19 0800  BP: 119/80 109/62  101/64  Pulse: 95 100 91 92  Resp: (!) 28 19 13 19   Temp:   98.6 F (37 C)   TempSrc:   Oral   SpO2: 96% 97% 99% 97%  Weight:  89.8 kg    Height:        Intake/Output Summary (Last 24 hours) at 09/19/2019  0902 Last data filed at 09/19/2019 0800 Gross per 24 hour  Intake 783 ml  Output 1585 ml  Net -802 ml   Last 3 Weights 09/19/2019 09/18/2019 09/17/2019  Weight (lbs) 197 lb 15.6 oz 206 lb 2.1 oz 209 lb 10.5 oz  Weight (kg) 89.8 kg 93.5 kg 95.1 kg      Telemetry    NSR - Personally Reviewed  ECG    No new since 11/28 - Personally Reviewed  Physical Exam   GEN: No acute distress.  Resting comfortably in bed, hard of hearing Neck: R IJ cordis in place Cardiac: RRR, no murmurs, rubs, or gallops.  Respiratory: Clear to auscultation bilaterally, minimal inflation at bases with normal breathing but improves with deep breathing. GI: Soft, nontender, non-distended  MS: Trace bilateral LE edema; No deformity. Neuro:  Nonfocal  Psych: Normal affect   Labs    High Sensitivity Troponin:   Recent Labs  Lab 09/11/19 0139 09/11/19 0334 09/11/19 0634 09/11/19 0806  TROPONINIHS 17 69* 81* 74*      Chemistry Recent Labs  Lab 09/17/19 1603 09/18/19 0413 09/19/19 0319  NA 135 135 133*  K 5.3* 5.3* 4.9  CL 107 107 105  CO2 21* 20* 22  GLUCOSE 212* 159* 142*  BUN 36* 40* 55*  CREATININE 1.87* 1.86* 2.40*  CALCIUM 8.6* 8.4* 8.7*  GFRNONAA 33* 33* 24*  GFRAA 38* 38* 28*  ANIONGAP 7 8 6      Hematology Recent Labs  Lab 09/17/19 1603 09/18/19 0413 09/19/19 0319  WBC 15.9* 15.7* 13.7*  RBC 2.55* 2.33* 2.18*  HGB 8.8* 8.2* 7.6*  HCT 27.7* 25.7* 23.7*  MCV 108.6* 110.3* 108.7*  MCH 34.5* 35.2* 34.9*  MCHC 31.8 31.9 32.1  RDW 25.0* 24.2* 23.5*  PLT PLATELET CLUMPS NOTED ON SMEAR, UNABLE TO ESTIMATE 101* 105*    BNPNo results for input(s): BNP, PROBNP in the last 168 hours.   DDimer No results for input(s): DDIMER in the last 168 hours.   Radiology    Dg Chest Port 1 View  Result Date: 09/19/2019 CLINICAL DATA:  Sore chest.  Chest tube. EXAM: PORTABLE CHEST 1 VIEW COMPARISON:  09/18/2019. FINDINGS: Right IJ line, mediastinal drainage catheters, left chest tube  in stable position. No pneumothorax. Prior CABG. Stable cardiomegaly. No pulmonary venous congestion. Persistent changes of subsegmental atelectasis again noted bilaterally. No interim change. No pleural effusion or pneumothorax. IMPRESSION: 1.  Lines and tubes in stable position. 2. Prior CABG. Stable cardiomegaly. No pulmonary venous congestion. 3. Persistent changes of subsegmental atelectasis again noted bilaterally. No interim change. Electronically Signed   By: Marcello Moores  Register   On: 09/19/2019 05:56   Dg Chest Port 1 View  Result Date: 09/18/2019 CLINICAL DATA:  Pleural effusion. EXAM: PORTABLE CHEST 1 VIEW COMPARISON:  09/17/2019 FINDINGS: Sequelae of CABG are again identified. Swan-Ganz catheter has been removed. A right jugular sheath, mediastinal drain, and left chest tube remain in place. The cardiac silhouette remains mildly enlarged. The lungs remain hypoinflated with similar appearance of mid and lower lung opacities bilaterally compatible with atelectasis. No sizable pleural effusion or pneumothorax is identified. IMPRESSION: 1. Interval removal of Swan-Ganz catheter. Other support devices as above. 2. Unchanged atelectasis bilaterally.  No pneumothorax. Electronically Signed   By: Logan Bores M.D.   On: 09/18/2019 06:21   [place] Ekg Site Rite  Result Date: 09/19/2019 If Site Rite image not attached, placement could not be confirmed due to current cardiac rhythm.   Cardiac Studies   Echo pending  Left heart catheterization 09/12/2019:  Mid LM to Dist LM lesion is 85% stenosed.  Prox Cx lesion is 50% stenosed.  Prox LAD to Mid LAD lesion is 85% stenosed.  Prox LAD lesion is 70% stenosed.  Mid LAD lesion is 45% stenosed.  Prox RCA to Mid RCA lesion is 70% stenosed.  Patient Profile     85 y.o. male with CAD s/p prior stent who presented with NSTEMI, now s/p CABG 11/27. CV risk factors of PAD s/p aortobifemoral bypass, HTN, HLD.  Assessment & Plan    Severe multivessel  CAD, now s/p 4V CABG 09/16/19 by Dr. Orvan Seen (LIMA-LAD, SVG-OM, SVG-Diag, AVG-RCA) -continue aspirin 81 mg -given NSTEMI, would use DAPT for 12 mos. Continue clopidogrel -continue rosuvastatin 20 mg -I reviewed the echo images (pending final read). Appears to have normal EF, similar to prior. There is a small primarily posterior appearing pericardial effusion. Unable to visualize IVC due to lack of subcostal views. It is a limited study focused on LV, so RV not well visualized but in parasternal views appears similar to prior. Function not clearly seen on apical views. -no beta blocker yet as he is still on dopamine (low dose) -Wt today 89.8, admission weight 87.5  kg. See below re: kidneys/diuresis -incentive spirometry, mobilization as able  Hypertension: holding home meds post op -was on metoprolol succinate 50 mg daily and losartan 100 mg daily prior to admission  Hyperlipidemia -continue rosuvastatin, increased to 20 mg this admission  PAD: warm feet, cannot feel pulses on exam. Continue secondary prevention as above  Acute on chronic kidney disease -Cr today 2.4 from 1.64 on admission and nadir of 1.33.  -would assess CVP with cordis as IVC not visualized on echo. If CVP elevated, agree with lasix drip. Need to mobilize fluid in the legs, and he is also above his admission weight, but do not want to worsen kidney function if he is intravascularly euvolemic.  Chronic anemia/thrombocytopenia -Hgb down to 7.6 today. Baseline ~10. Suspect acute blood loss anemia from surgery superimposed on anemia of chronic disease.  -no clear active blood loss -would consider transfusion given rising Cr -platelets 105, had been ~140s, monitor  For questions or updates, please contact [place] Please consult [email or web address] for contact info under     Signed, Buford Dresser, MD  09/19/2019, 9:02 AM

## 2019-09-19 NOTE — Anesthesia Postprocedure Evaluation (Signed)
Anesthesia Post Note  Patient: Brandon Lewis  Procedure(s) Performed: CORONARY ARTERY BYPASS GRAFTING (CABG) using LIMA to LAD; Endoscopic right saphenous vein harvest to OM1, Diag1, and RCA. (N/A Chest) TRANSESOPHAGEAL ECHOCARDIOGRAM (TEE) (N/A ) INDOCYANINE GREEN FLUORESCENCE IMAGING (ICG) (N/A )     Patient location during evaluation: SICU Anesthesia Type: General Level of consciousness: awake Pain management: pain level controlled Vital Signs Assessment: post-procedure vital signs reviewed and stable Respiratory status: spontaneous breathing Cardiovascular status: stable Postop Assessment: no apparent nausea or vomiting Anesthetic complications: no    Last Vitals:  Vitals:   09/19/19 0700 09/19/19 0800  BP:  101/64  Pulse: 91 92  Resp: 13 19  Temp: 37 C   SpO2: 99% 97%    Last Pain:  Vitals:   09/19/19 0816  TempSrc:   PainSc: Brandon Lewis

## 2019-09-20 ENCOUNTER — Inpatient Hospital Stay (HOSPITAL_COMMUNITY): Payer: Medicare Other

## 2019-09-20 DIAGNOSIS — Z951 Presence of aortocoronary bypass graft: Secondary | ICD-10-CM

## 2019-09-20 DIAGNOSIS — R609 Edema, unspecified: Secondary | ICD-10-CM

## 2019-09-20 DIAGNOSIS — I219 Acute myocardial infarction, unspecified: Secondary | ICD-10-CM

## 2019-09-20 HISTORY — DX: Acute myocardial infarction, unspecified: I21.9

## 2019-09-20 LAB — COMPREHENSIVE METABOLIC PANEL
ALT: 23 U/L (ref 0–44)
AST: 25 U/L (ref 15–41)
Albumin: 2.7 g/dL — ABNORMAL LOW (ref 3.5–5.0)
Alkaline Phosphatase: 51 U/L (ref 38–126)
Anion gap: 13 (ref 5–15)
BUN: 62 mg/dL — ABNORMAL HIGH (ref 8–23)
CO2: 19 mmol/L — ABNORMAL LOW (ref 22–32)
Calcium: 9 mg/dL (ref 8.9–10.3)
Chloride: 101 mmol/L (ref 98–111)
Creatinine, Ser: 2.11 mg/dL — ABNORMAL HIGH (ref 0.61–1.24)
GFR calc Af Amer: 33 mL/min — ABNORMAL LOW (ref >60)
GFR calc non Af Amer: 28 mL/min — ABNORMAL LOW (ref >60)
Glucose, Bld: 144 mg/dL — ABNORMAL HIGH (ref 70–99)
Potassium: 4.3 mmol/L (ref 3.5–5.1)
Sodium: 133 mmol/L — ABNORMAL LOW (ref 135–145)
Total Bilirubin: 1 mg/dL (ref 0.3–1.2)
Total Protein: 6.3 g/dL — ABNORMAL LOW (ref 6.5–8.1)

## 2019-09-20 LAB — BASIC METABOLIC PANEL
Anion gap: 14 (ref 5–15)
BUN: 60 mg/dL — ABNORMAL HIGH (ref 8–23)
CO2: 17 mmol/L — ABNORMAL LOW (ref 22–32)
Calcium: 8.9 mg/dL (ref 8.9–10.3)
Chloride: 104 mmol/L (ref 98–111)
Creatinine, Ser: 2.04 mg/dL — ABNORMAL HIGH (ref 0.61–1.24)
GFR calc Af Amer: 34 mL/min — ABNORMAL LOW (ref >60)
GFR calc non Af Amer: 29 mL/min — ABNORMAL LOW (ref >60)
Glucose, Bld: 125 mg/dL — ABNORMAL HIGH (ref 70–99)
Potassium: 4.4 mmol/L (ref 3.5–5.1)
Sodium: 135 mmol/L (ref 135–145)

## 2019-09-20 LAB — CBC WITH DIFFERENTIAL/PLATELET
Abs Immature Granulocytes: 0.2 10*3/uL — ABNORMAL HIGH (ref 0.00–0.07)
Basophils Absolute: 0.1 10*3/uL (ref 0.0–0.1)
Basophils Relative: 0 %
Eosinophils Absolute: 0.1 10*3/uL (ref 0.0–0.5)
Eosinophils Relative: 1 %
HCT: 27.3 % — ABNORMAL LOW (ref 39.0–52.0)
Hemoglobin: 8.5 g/dL — ABNORMAL LOW (ref 13.0–17.0)
Immature Granulocytes: 1 %
Lymphocytes Relative: 8 %
Lymphs Abs: 1.2 10*3/uL (ref 0.7–4.0)
MCH: 34.3 pg — ABNORMAL HIGH (ref 26.0–34.0)
MCHC: 31.1 g/dL (ref 30.0–36.0)
MCV: 110.1 fL — ABNORMAL HIGH (ref 80.0–100.0)
Monocytes Absolute: 1.8 10*3/uL — ABNORMAL HIGH (ref 0.1–1.0)
Monocytes Relative: 13 %
Neutro Abs: 11.1 10*3/uL — ABNORMAL HIGH (ref 1.7–7.7)
Neutrophils Relative %: 77 %
Platelets: 126 10*3/uL — ABNORMAL LOW (ref 150–400)
RBC: 2.48 MIL/uL — ABNORMAL LOW (ref 4.22–5.81)
RDW: 23.3 % — ABNORMAL HIGH (ref 11.5–15.5)
WBC: 14.4 10*3/uL — ABNORMAL HIGH (ref 4.0–10.5)
nRBC: 0.2 % (ref 0.0–0.2)

## 2019-09-20 LAB — BPAM RBC
Blood Product Expiration Date: 202012292359
ISSUE DATE / TIME: 202011301103
Unit Type and Rh: 5100

## 2019-09-20 LAB — TYPE AND SCREEN
ABO/RH(D): O POS
Antibody Screen: NEGATIVE
Unit division: 0

## 2019-09-20 LAB — GLUCOSE, CAPILLARY
Glucose-Capillary: 114 mg/dL — ABNORMAL HIGH (ref 70–99)
Glucose-Capillary: 117 mg/dL — ABNORMAL HIGH (ref 70–99)
Glucose-Capillary: 121 mg/dL — ABNORMAL HIGH (ref 70–99)
Glucose-Capillary: 128 mg/dL — ABNORMAL HIGH (ref 70–99)
Glucose-Capillary: 132 mg/dL — ABNORMAL HIGH (ref 70–99)
Glucose-Capillary: 132 mg/dL — ABNORMAL HIGH (ref 70–99)

## 2019-09-20 MED ORDER — LIDOCAINE HCL URETHRAL/MUCOSAL 2 % EX GEL
1.0000 "application " | Freq: Once | CUTANEOUS | Status: AC
  Administered 2019-09-20: 1
  Filled 2019-09-20: qty 20

## 2019-09-20 MED ORDER — ROSUVASTATIN CALCIUM 20 MG PO TABS
20.0000 mg | ORAL_TABLET | Freq: Every day | ORAL | Status: DC
  Administered 2019-09-21: 20 mg
  Filled 2019-09-20: qty 1

## 2019-09-20 MED ORDER — CLOPIDOGREL BISULFATE 75 MG PO TABS
75.0000 mg | ORAL_TABLET | Freq: Every day | ORAL | Status: DC
  Administered 2019-09-21 – 2019-09-22 (×2): 75 mg
  Filled 2019-09-20 (×2): qty 1

## 2019-09-20 MED ORDER — DOCUSATE SODIUM 50 MG/5ML PO LIQD
200.0000 mg | Freq: Every day | ORAL | Status: DC
  Administered 2019-09-20 – 2019-09-21 (×2): 200 mg
  Filled 2019-09-20 (×2): qty 20

## 2019-09-20 MED ORDER — PHENOL 1.4 % MT LIQD
1.0000 | OROMUCOSAL | Status: DC | PRN
  Filled 2019-09-20: qty 177

## 2019-09-20 MED ORDER — FE FUMARATE-B12-VIT C-FA-IFC PO CAPS
1.0000 | ORAL_CAPSULE | Freq: Two times a day (BID) | ORAL | Status: DC
  Administered 2019-09-20: 1 via ORAL
  Filled 2019-09-20: qty 1

## 2019-09-20 MED ORDER — FUROSEMIDE 10 MG/ML IJ SOLN
60.0000 mg | Freq: Once | INTRAMUSCULAR | Status: AC
  Administered 2019-09-20: 09:00:00 60 mg via INTRAVENOUS
  Filled 2019-09-20: qty 6

## 2019-09-20 MED ORDER — SIMETHICONE 40 MG/0.6ML PO SUSP
80.0000 mg | Freq: Four times a day (QID) | ORAL | Status: DC | PRN
  Administered 2019-09-20 – 2019-09-21 (×3): 80 mg via ORAL
  Filled 2019-09-20 (×5): qty 1.2

## 2019-09-20 MED ORDER — ADULT MULTIVITAMIN LIQUID CH
15.0000 mL | Freq: Every day | ORAL | Status: DC
  Administered 2019-09-21: 15 mL
  Filled 2019-09-20: qty 15

## 2019-09-20 MED ORDER — THIAMINE HCL 100 MG/ML IJ SOLN
Freq: Once | INTRAVENOUS | Status: AC
  Administered 2019-09-20: 18:00:00 via INTRAVENOUS
  Filled 2019-09-20: qty 1000

## 2019-09-20 MED ORDER — PANTOPRAZOLE SODIUM 40 MG PO PACK
40.0000 mg | PACK | Freq: Every day | ORAL | Status: DC
  Administered 2019-09-21: 40 mg
  Filled 2019-09-20: qty 20

## 2019-09-20 MED ORDER — COLCHICINE 0.3 MG HALF TABLET
0.3000 mg | ORAL_TABLET | Freq: Two times a day (BID) | ORAL | Status: DC
  Administered 2019-09-20 – 2019-09-21 (×2): 0.3 mg
  Filled 2019-09-20 (×3): qty 1

## 2019-09-20 MED ORDER — ASPIRIN 81 MG PO CHEW
81.0000 mg | CHEWABLE_TABLET | Freq: Every day | ORAL | Status: DC
  Administered 2019-09-21 – 2019-09-22 (×2): 81 mg
  Filled 2019-09-20 (×2): qty 1

## 2019-09-20 NOTE — Progress Notes (Signed)
Progress Note  Patient Name: Brandon Lewis Date of Encounter: 09/20/2019  Primary Cardiologist: Corey Skains, MD  Subjective   Sitting in chair today. Abdomen distended, feels like he has gas. Has some intermittent pain with this, mimicked with palpation and better with changing position. Does not think he has had a bowel movement. Tolerating PO.  Inpatient Medications    Scheduled Meds: . acetaminophen  1,000 mg Oral Q6H   Or  . acetaminophen (TYLENOL) oral liquid 160 mg/5 mL  1,000 mg Per Tube Q6H  . aspirin EC  81 mg Oral Daily  . bisacodyl  10 mg Oral Daily   Or  . bisacodyl  10 mg Rectal Daily  . Chlorhexidine Gluconate Cloth  6 each Topical Daily  . clopidogrel  75 mg Oral Daily  . colchicine  0.3 mg Oral BID  . docusate sodium  200 mg Oral Daily  . enoxaparin (LOVENOX) injection  30 mg Subcutaneous QHS  . ferrous [id number] C-folic acid  1 capsule Oral BID PC  . insulin aspart  0-24 Units Subcutaneous Q4H  . levalbuterol  0.63 mg Nebulization Q6H  . mouth rinse  15 mL Mouth Rinse BID  . multivitamins with iron  1 tablet Oral Daily  . pantoprazole  40 mg Oral Daily  . pneumococcal 23 valent vaccine  0.5 mL Intramuscular Once  . rosuvastatin  20 mg Oral q1800  . sodium chloride flush  3 mL Intravenous Q12H  . tamsulosin  0.4 mg Oral Daily   Continuous Infusions: . sodium chloride    . DOPamine 1 mcg/kg/min (09/20/19 0728)  . lactated ringers    . lactated ringers Stopped (09/17/19 1156)  . lactated ringers Stopped (09/17/19 1237)   PRN Meds: lactated ringers, metoprolol tartrate, ondansetron (ZOFRAN) IV, oxyCODONE, simethicone, sodium chloride flush, traMADol   Vital Signs    Vitals:   09/20/19 0400 09/20/19 0500 09/20/19 0600 09/20/19 0700  BP: 108/73 (!) 98/51 94/81   Pulse: (!) 109 100 (!) 103   Resp: (!) 22 13 14    Temp: 98.4 F (36.9 C)   98 F (36.7 C)  TempSrc: Oral   Oral  SpO2: 99% 99% 99%   Weight:  90.4 kg    Height:         Intake/Output Summary (Last 24 hours) at 09/20/2019 0924 Last data filed at 09/20/2019 [id number] Gross per 24 hour  Intake 391.53 ml  Output 825 ml  Net -433.47 ml   Last 3 Weights 09/20/2019 09/19/2019 09/18/2019  Weight (lbs) 199 lb 4.7 oz 197 lb 15.6 oz 206 lb 2.1 oz  Weight (kg) 90.4 kg 89.8 kg 93.5 kg      Telemetry    SR with occasional ST in low 100s - Personally Reviewed  ECG    No new since 11/28 - Personally Reviewed  Physical Exam   GEN: Well nourished, well developed in no acute distress HEENT: Normal, moist mucous membranes NECK: No JVD visible sitting upright, dressing over right IJ prior site CARDIAC: regular rhythm, normal S1 and S2, no rubs or gallops. No murmur. VASCULAR: Radial and DP pulses 2+ bilaterally. No carotid bruits RESPIRATORY:  Clear to auscultation in upper fields, diminished air movement at bases. ABDOMEN: tympanic, distended, mildly tender to deep palpation MUSCULOSKELETAL:  Moves all 4 limbs independently SKIN: Warm and dry, trace bilateral LE edema NEUROLOGIC:  Alert and oriented x 3. No focal neuro deficits noted. PSYCHIATRIC:  Normal affect   Labs    High  Sensitivity Troponin:   Recent Labs  Lab 09/11/19 0139 09/11/19 0334 09/11/19 0634 09/11/19 0806  TROPONINIHS 17 69* 81* 74*      Chemistry Recent Labs  Lab 09/19/19 0319 09/19/19 1450 09/20/19 0500  NA 133* 133* 135  K 4.9 4.8 4.4  CL 105 101 104  CO2 22 22 17*  GLUCOSE 142* 148* 125*  BUN 55* 58* 60*  CREATININE 2.40* 2.30* 2.04*  CALCIUM 8.7* 8.8* 8.9  GFRNONAA 24* 25* 29*  GFRAA 28* 29* 34*  ANIONGAP 6 10 14      Hematology Recent Labs  Lab 09/18/19 0413 09/19/19 0319 09/20/19 0500  WBC 15.7* 13.7* 14.4*  RBC 2.33* 2.18* 2.48*  HGB 8.2* 7.6* 8.5*  HCT 25.7* 23.7* 27.3*  MCV 110.3* 108.7* 110.1*  MCH 35.2* 34.9* 34.3*  MCHC 31.9 32.1 31.1  RDW 24.2* 23.5* 23.3*  PLT 101* 105* 126*    BNPNo results for input(s): BNP, PROBNP in the last 168 hours.    DDimer No results for input(s): DDIMER in the last 168 hours.   Radiology    Dg Chest Port 1 View  Result Date: 09/20/2019 CLINICAL DATA:  CABG 4 days ago. Sore chest. EXAM: PORTABLE CHEST 1 VIEW COMPARISON:  One-view chest x-ray 09/19/2019 FINDINGS: The right IJ sheath has been removed. Mediastinal drains and left-sided chest tube were removed. There is no pneumothorax or pneumomediastinum. Right-sided PICC line remains. Heart is enlarged. Lung volumes remain low. Small effusions remain, left greater than right. Bibasilar airspace opacities are similar the prior exam, left greater than right. There is minimal fluid within the right minor fissure. IMPRESSION: 1. Interval removal of mediastinal drains and left-sided chest tube without pneumothorax or pneumomediastinum. 2. Stable bibasilar airspace disease, left greater than right. This likely reflects atelectasis. Electronically Signed   By: San Morelle M.D.   On: 09/20/2019 07:47   Dg Chest Port 1 View  Result Date: 09/19/2019 CLINICAL DATA:  Sore chest.  Chest tube. EXAM: PORTABLE CHEST 1 VIEW COMPARISON:  09/18/2019. FINDINGS: Right IJ line, mediastinal drainage catheters, left chest tube in stable position. No pneumothorax. Prior CABG. Stable cardiomegaly. No pulmonary venous congestion. Persistent changes of subsegmental atelectasis again noted bilaterally. No interim change. No pleural effusion or pneumothorax. IMPRESSION: 1.  Lines and tubes in stable position. 2. Prior CABG. Stable cardiomegaly. No pulmonary venous congestion. 3. Persistent changes of subsegmental atelectasis again noted bilaterally. No interim change. Electronically Signed   By: Marcello Moores  Register   On: 09/19/2019 05:56   [place] Ekg Site Rite  Result Date: 09/19/2019 If Site Rite image not attached, placement could not be confirmed due to current cardiac rhythm.   Cardiac Studies   Echo limited 09/19/19  1. Left ventricular ejection fraction, by visual estimation,  is 70 to 75%. The left ventricle has hyperdynamic function. There is no left ventricular hypertrophy.  2. Global right ventricle has normal systolic function.The right ventricular size is normal. No increase in right ventricular wall thickness.  3. Left atrial size was normal.  4. Right atrial size was normal.  5. Mild mitral annular calcification.  6. The mitral valve is normal in structure. No evidence of mitral valve regurgitation.  7. The tricuspid valve is normal in structure. Tricuspid valve regurgitation is not demonstrated.  8. The aortic valve is tricuspid. Aortic valve regurgitation is not visualized. Mild aortic valve sclerosis without stenosis.  9. The pulmonic valve was not well visualized. Pulmonic valve regurgitation is not visualized.  Left heart catheterization 09/12/2019:  Mid LM to Dist LM lesion is 85% stenosed.  Prox Cx lesion is 50% stenosed.  Prox LAD to Mid LAD lesion is 85% stenosed.  Prox LAD lesion is 70% stenosed.  Mid LAD lesion is 45% stenosed.  Prox RCA to Mid RCA lesion is 70% stenosed.  Patient Profile     85 y.o. male with CAD s/p prior stent who presented with NSTEMI, now s/p CABG 11/27. CV risk factors of PAD s/p aortobifemoral bypass, HTN, HLD.  Assessment & Plan    Abdominal pain/distension: tympanic, has bowel sounds, reports no BM -will give simethicone for gas PRN -has bowel regimen ordered  Severe multivessel CAD, now s/p 4V CABG 09/16/19 by Dr. Orvan Seen (LIMA-LAD, SVG-OM, SVG-Diag, AVG-RCA) -continue aspirin 81 mg  -given NSTEMI, would use DAPT for 12 mos. Continue clopidogrel -continue rosuvastatin 20 mg -no beta blocker yet as he is still on dopamine (low dose) -Wt today 90.4, admission weight 87.5 kg. See below re: kidneys/diuresis -incentive spirometry, mobilization as able  Hypertension: holding home meds post op -was on metoprolol succinate 50 mg daily and losartan 100 mg daily prior to admission -holding while on dopamine,  will add back when able.  Hyperlipidemia -continue rosuvastatin, increased to 20 mg this admission  PAD: warm feet, cannot feel pulses on exam. Continue secondary prevention as above  Acute on chronic kidney disease -Cr today 2.04 from 1.64 on admission; peak 2.4, nadir 1.33.  -agree with gentle diuresis and mobilization to improve edema  Chronic anemia/thrombocytopenia -Hgb baseline ~10. Suspect acute blood loss anemia from surgery superimposed on anemia of chronic disease.  -no clear active blood loss -Hgb increased from 7.6 to 8.5 after 1 unit PRBC 11/30 -platelets 126 from 105, had been ~140s, monitor  For questions or updates, please contact [place] Please consult [email or web address] for contact info under     Signed, Buford Dresser, MD  09/20/2019, 9:24 AM

## 2019-09-20 NOTE — Progress Notes (Addendum)
Patient ID: Brandon Lewis, male   DOB: 1934/04/30, 85 y.o.   MRN: [id number] EVENING ROUNDS NOTE :     [address]       [place] [address]             [phone]                 4 Days Post-Op Procedure(s) (LRB): CORONARY ARTERY BYPASS GRAFTING (CABG) using LIMA to LAD; Endoscopic right saphenous vein harvest to OM1, Diag1, and RCA. (N/A) TRANSESOPHAGEAL ECHOCARDIOGRAM (TEE) (N/A) INDOCYANINE GREEN FLUORESCENCE IMAGING (ICG) (N/A)  Total Length of Stay:  LOS: 8 days  BP 119/66 (BP Location: Left Arm)   Pulse (!) 104   Temp 97.8 F (36.6 C) (Oral)   Resp (!) 23   Ht 5\' 10"  (1.778 m)   Wt 90.4 kg   SpO2 97%   BMI 28.60 kg/m   .Intake/Output      11/30 0701 - 12/01 0700 12/01 0701 - 12/02 0700   P.O.  300   I.V. (mL/kg) 78.1 (0.9) 22 (0.2)   Blood 315    Total Intake(mL/kg) 393.1 (4.3) 322 (3.6)   Urine (mL/kg/hr) 925 (0.4) 895 (1)   Chest Tube 50    Total Output 975 895   Net -581.9 -573.1          . sodium chloride    . DOPamine 1 mcg/kg/min (09/20/19 1600)  . lactated ringers    . lactated ringers Stopped (09/17/19 1156)  . lactated ringers Stopped (09/17/19 1237)  . banana bag IV 1000 mL       Lab Results  Component Value Date   WBC 14.4 (H) 09/20/2019   HGB 8.5 (L) 09/20/2019   HCT 27.3 (L) 09/20/2019   PLT 126 (L) 09/20/2019   GLUCOSE 144 (H) 09/20/2019   CHOL 134 09/11/2019   TRIG 49 09/11/2019   HDL 32 (L) 09/11/2019   LDLDIRECT 168.7 09/22/2007   LDLCALC 92 09/11/2019   ALT 23 09/20/2019   AST 25 09/20/2019   NA 133 (L) 09/20/2019   K 4.3 09/20/2019   CL 101 09/20/2019   CREATININE 2.11 (H) 09/20/2019   BUN 62 (H) 09/20/2019   CO2 19 (L) 09/20/2019   TSH 1.38 07/19/2013   PSA 1.02 09/22/2007   INR 1.3 (H) 09/16/2019   HGBA1C 6.5 (H) 09/15/2019   Dg Abd 1 View  Result Date: 09/20/2019 CLINICAL DATA:  85 year old male with possible ileus status post heart surgery. EXAM: ABDOMEN - 1 VIEW COMPARISON:  None. FINDINGS:  Evaluation is limited due to patient positioning and technique. No dilated bowel loops noted in the visualized abdomen. There is air within the visualized colon. Multiple linear wires and radiopaque linear needle-like structures noted in the upper abdomen. Clinical correlation is recommended. There is right upper quadrant cholecystectomy clips. Median sternotomy wires and CABG vascular clips noted. Partially visualized central venous line with tip at the cavoatrial junction. Left lung base atelectasis. IMPRESSION: 1. No dilated bowel loops. 2. Multiple linear needle-like structures in the upper abdomen. Clinical correlation is recommended. 3. Left lung base atelectasis. Electronically Signed   By: Anner Crete M.D.   On: 09/20/2019 16:46   Dg Chest Port 1 View  Result Date: 09/20/2019 CLINICAL DATA:  CABG 4 days ago. Sore chest. EXAM: PORTABLE CHEST 1 VIEW COMPARISON:  One-view chest x-ray 09/19/2019 FINDINGS: The right IJ sheath has been removed. Mediastinal drains and left-sided chest tube were removed. There is no pneumothorax  or pneumomediastinum. Right-sided PICC line remains. Heart is enlarged. Lung volumes remain low. Small effusions remain, left greater than right. Bibasilar airspace opacities are similar the prior exam, left greater than right. There is minimal fluid within the right minor fissure. IMPRESSION: 1. Interval removal of mediastinal drains and left-sided chest tube without pneumothorax or pneumomediastinum. 2. Stable bibasilar airspace disease, left greater than right. This likely reflects atelectasis. Electronically Signed   By: San Morelle M.D.   On: 09/20/2019 07:47      Non painful abdominal distention- NG tube placed today  Cr 2.11   Grace Isaac MD  Beeper [phone] Office [phone] 09/20/2019 5:17 PM

## 2019-09-20 NOTE — Progress Notes (Signed)
4 Days Post-Op Procedure(s) (LRB): CORONARY ARTERY BYPASS GRAFTING (CABG) using LIMA to LAD; Endoscopic right saphenous vein harvest to OM1, Diag1, and RCA. (N/A) TRANSESOPHAGEAL ECHOCARDIOGRAM (TEE) (N/A) INDOCYANINE GREEN FLUORESCENCE IMAGING (ICG) (N/A) Subjective: No complaints  Objective: Vital signs in last 24 hours: Temp:  [97.8 F (36.6 C)-98.4 F (36.9 C)] 98.4 F (36.9 C) (12/01 0400) Pulse Rate:  [92-109] 103 (12/01 0600) Cardiac Rhythm: Normal sinus rhythm;Sinus tachycardia (12/01 0400) Resp:  [11-29] 14 (12/01 0600) BP: (93-127)/(51-81) 94/81 (12/01 0600) SpO2:  [91 %-99 %] 99 % (12/01 0600) Weight:  [90.4 kg] 90.4 kg (12/01 0500)  Hemodynamic parameters for last 24 hours: CVP:  [5 mmHg-12 mmHg] 12 mmHg  Intake/Output from previous day: 11/30 0701 - 12/01 0700 In: 391.6 [I.V.:76.6; Blood:315] Out: 975 [Urine:925; Chest Tube:50] Intake/Output this shift: No intake/output data recorded.  General appearance: alert, cooperative and no distress Neurologic: intact Heart: regular rate and rhythm, S1, S2 normal, no murmur, click, rub or gallop Lungs: clear to auscultation bilaterally Extremities: extremities normal, atraumatic, no cyanosis or edema Wound: c/d/i  Lab Results: Recent Labs    09/19/19 0319 09/20/19 0500  WBC 13.7* 14.4*  HGB 7.6* 8.5*  HCT 23.7* 27.3*  PLT 105* 126*   BMET:  Recent Labs    09/19/19 1450 09/20/19 0500  NA 133* 135  K 4.8 4.4  CL 101 104  CO2 22 17*  GLUCOSE 148* 125*  BUN 58* 60*  CREATININE 2.30* 2.04*  CALCIUM 8.8* 8.9    PT/INR: No results for input(s): LABPROT, INR in the last 72 hours. ABG    Component Value Date/Time   PHART 7.313 (L) 09/16/2019 1922   HCO3 16.2 (L) 09/16/2019 1922   TCO2 17 (L) 09/16/2019 1922   ACIDBASEDEF 9.0 (H) 09/16/2019 1922   O2SAT 96.0 09/16/2019 1922   CBG (last 3)  Recent Labs    09/19/19 2000 09/20/19 0026 09/20/19 0404  GLUCAP 154* 132* 114*    Assessment/Plan: S/P  Procedure(s) (LRB): CORONARY ARTERY BYPASS GRAFTING (CABG) using LIMA to LAD; Endoscopic right saphenous vein harvest to OM1, Diag1, and RCA. (N/A) TRANSESOPHAGEAL ECHOCARDIOGRAM (TEE) (N/A) INDOCYANINE GREEN FLUORESCENCE IMAGING (ICG) (N/A) Mobilize Diuresis wean DA to 1; gentle diuresis  Making good progress   LOS: 8 days    Wonda Olds 09/20/2019

## 2019-09-21 ENCOUNTER — Inpatient Hospital Stay (HOSPITAL_COMMUNITY): Payer: Medicare Other

## 2019-09-21 LAB — BASIC METABOLIC PANEL
Anion gap: 11 (ref 5–15)
BUN: 63 mg/dL — ABNORMAL HIGH (ref 8–23)
CO2: 20 mmol/L — ABNORMAL LOW (ref 22–32)
Calcium: 8.8 mg/dL — ABNORMAL LOW (ref 8.9–10.3)
Chloride: 105 mmol/L (ref 98–111)
Creatinine, Ser: 1.9 mg/dL — ABNORMAL HIGH (ref 0.61–1.24)
GFR calc Af Amer: 37 mL/min — ABNORMAL LOW (ref >60)
GFR calc non Af Amer: 32 mL/min — ABNORMAL LOW (ref >60)
Glucose, Bld: 119 mg/dL — ABNORMAL HIGH (ref 70–99)
Potassium: 3.9 mmol/L (ref 3.5–5.1)
Sodium: 136 mmol/L (ref 135–145)

## 2019-09-21 LAB — COMPREHENSIVE METABOLIC PANEL
ALT: 22 U/L (ref 0–44)
AST: 28 U/L (ref 15–41)
Albumin: 2.3 g/dL — ABNORMAL LOW (ref 3.5–5.0)
Alkaline Phosphatase: 50 U/L (ref 38–126)
Anion gap: 10 (ref 5–15)
BUN: 58 mg/dL — ABNORMAL HIGH (ref 8–23)
CO2: 20 mmol/L — ABNORMAL LOW (ref 22–32)
Calcium: 8.6 mg/dL — ABNORMAL LOW (ref 8.9–10.3)
Chloride: 108 mmol/L (ref 98–111)
Creatinine, Ser: 1.79 mg/dL — ABNORMAL HIGH (ref 0.61–1.24)
GFR calc Af Amer: 40 mL/min — ABNORMAL LOW (ref >60)
GFR calc non Af Amer: 34 mL/min — ABNORMAL LOW (ref >60)
Glucose, Bld: 167 mg/dL — ABNORMAL HIGH (ref 70–99)
Potassium: 3.8 mmol/L (ref 3.5–5.1)
Sodium: 138 mmol/L (ref 135–145)
Total Bilirubin: 0.8 mg/dL (ref 0.3–1.2)
Total Protein: 5.4 g/dL — ABNORMAL LOW (ref 6.5–8.1)

## 2019-09-21 LAB — GLUCOSE, CAPILLARY
Glucose-Capillary: 120 mg/dL — ABNORMAL HIGH (ref 70–99)
Glucose-Capillary: 123 mg/dL — ABNORMAL HIGH (ref 70–99)
Glucose-Capillary: 139 mg/dL — ABNORMAL HIGH (ref 70–99)
Glucose-Capillary: 140 mg/dL — ABNORMAL HIGH (ref 70–99)
Glucose-Capillary: 143 mg/dL — ABNORMAL HIGH (ref 70–99)
Glucose-Capillary: 151 mg/dL — ABNORMAL HIGH (ref 70–99)
Glucose-Capillary: 156 mg/dL — ABNORMAL HIGH (ref 70–99)

## 2019-09-21 LAB — CBC WITH DIFFERENTIAL/PLATELET
Abs Immature Granulocytes: 0.27 10*3/uL — ABNORMAL HIGH (ref 0.00–0.07)
Basophils Absolute: 0.1 10*3/uL (ref 0.0–0.1)
Basophils Relative: 1 %
Eosinophils Absolute: 0.1 10*3/uL (ref 0.0–0.5)
Eosinophils Relative: 1 %
HCT: 28 % — ABNORMAL LOW (ref 39.0–52.0)
Hemoglobin: 8.6 g/dL — ABNORMAL LOW (ref 13.0–17.0)
Immature Granulocytes: 2 %
Lymphocytes Relative: 10 %
Lymphs Abs: 1.2 10*3/uL (ref 0.7–4.0)
MCH: 33.6 pg (ref 26.0–34.0)
MCHC: 30.7 g/dL (ref 30.0–36.0)
MCV: 109.4 fL — ABNORMAL HIGH (ref 80.0–100.0)
Monocytes Absolute: 1.7 10*3/uL — ABNORMAL HIGH (ref 0.1–1.0)
Monocytes Relative: 14 %
Neutro Abs: 8.8 10*3/uL — ABNORMAL HIGH (ref 1.7–7.7)
Neutrophils Relative %: 72 %
Platelets: 155 10*3/uL (ref 150–400)
RBC: 2.56 MIL/uL — ABNORMAL LOW (ref 4.22–5.81)
RDW: 22.7 % — ABNORMAL HIGH (ref 11.5–15.5)
WBC: 12.1 10*3/uL — ABNORMAL HIGH (ref 4.0–10.5)
nRBC: 0.4 % — ABNORMAL HIGH (ref 0.0–0.2)

## 2019-09-21 MED ORDER — METOCLOPRAMIDE HCL 5 MG/ML IJ SOLN
5.0000 mg | Freq: Three times a day (TID) | INTRAMUSCULAR | Status: AC
  Administered 2019-09-21 – 2019-09-22 (×5): 5 mg via INTRAVENOUS
  Filled 2019-09-21 (×4): qty 2

## 2019-09-21 MED ORDER — KCL IN DEXTROSE-NACL 10-5-0.45 MEQ/L-%-% IV SOLN
INTRAVENOUS | Status: DC
  Administered 2019-09-21 – 2019-09-22 (×2): via INTRAVENOUS
  Filled 2019-09-21 (×3): qty 1000

## 2019-09-21 MED ORDER — PHENYLEPHRINE 40 MCG/ML (10ML) SYRINGE FOR IV PUSH (FOR BLOOD PRESSURE SUPPORT)
PREFILLED_SYRINGE | INTRAVENOUS | Status: AC
  Filled 2019-09-21: qty 30

## 2019-09-21 MED ORDER — MAGNESIUM SULFATE 2 GM/50ML IV SOLN
2.0000 g | Freq: Once | INTRAVENOUS | Status: AC
  Administered 2019-09-21: 2 g via INTRAVENOUS
  Filled 2019-09-21: qty 50

## 2019-09-21 MED FILL — Mannitol IV Soln 20%: INTRAVENOUS | Qty: 500 | Status: AC

## 2019-09-21 MED FILL — Heparin Sodium (Porcine) Inj 1000 Unit/ML: INTRAMUSCULAR | Qty: 40 | Status: AC

## 2019-09-21 MED FILL — Sodium Chloride IV Soln 0.9%: INTRAVENOUS | Qty: 3000 | Status: AC

## 2019-09-21 MED FILL — Sodium Bicarbonate IV Soln 8.4%: INTRAVENOUS | Qty: 100 | Status: AC

## 2019-09-21 MED FILL — Calcium Chloride Inj 10%: INTRAVENOUS | Qty: 10 | Status: AC

## 2019-09-21 MED FILL — Lidocaine HCl Local Soln Prefilled Syringe 100 MG/5ML (2%): INTRAMUSCULAR | Qty: 5 | Status: AC

## 2019-09-21 MED FILL — Electrolyte-R (PH 7.4) Solution: INTRAVENOUS | Qty: 4000 | Status: AC

## 2019-09-21 NOTE — Progress Notes (Signed)
Initial Nutrition Assessment  DOCUMENTATION CODES:   Not applicable  INTERVENTION:   Once diet advanced:   Boost Breeze po TID, each supplement provides 250 kcal and 9 grams of protein  MVI daily   NUTRITION DIAGNOSIS:   Increased nutrient needs related to post-op healing as evidenced by estimated needs.  GOAL:   Patient will meet greater than or equal to 90% of their needs  MONITOR:   Supplement acceptance, Diet advancement, PO intake, Labs, Weight trends, I & O's, Skin  REASON FOR ASSESSMENT:   NPO/Clear Liquid Diet    ASSESSMENT:   Patient with PMH significant for CAD s/p remote PCI/DES, PVD s/p aortobifemoral bypass with fem artery occlusion, HTN, and HLD. Presents this admission with severe CAD with unstable angina.   11/27- CABG x4, extubated    Pt unable to provide history. Remains lethargic. NGT placed for decompression yesterday. Taken out today? Remains NPO. RD to provided supplementation once diet advanced.    Weight shows to fluctuate over the last year. Will utilize 87.5 kg for EDW.   I/O: +2,180 ml since admit UOP: 1,420 ml x 24 hrs    Drips: D5 in 1/2 NS with 10 KCl @ 50 ml/hr, dopamine Medications: dulcolax, colace, SS novolog, reglan, MVI Labs: CBG 120-156   NUTRITION - FOCUSED PHYSICAL EXAM:    Most Recent Value  Orbital Region  No depletion  Upper Arm Region  No depletion  Thoracic and Lumbar Region  No depletion  Buccal Region  No depletion  Temple Region  No depletion  Clavicle Bone Region  No depletion  Clavicle and Acromion Bone Region  No depletion  Scapular Bone Region  No depletion  Dorsal Hand  No depletion  Patellar Region  No depletion  Anterior Thigh Region  No depletion  Posterior Calf Region  No depletion  Edema (RD Assessment)  Mild  Hair  Reviewed  Eyes  Reviewed  Mouth  Reviewed  Skin  Reviewed  Nails  Reviewed       Diet Order:   Diet Order            Diet NPO time specified  Diet effective now               EDUCATION NEEDS:   Not appropriate for education at this time  Skin:  Skin Assessment: Skin Integrity Issues: Skin Integrity Issues:: Incisions Incisions: L leg, chest  Last BM:  11/29  Height:   Ht Readings from Last 1 Encounters:  09/17/19 5\' 10"  (1.778 m)    Weight:   Wt Readings from Last 1 Encounters:  09/21/19 90 kg    Ideal Body Weight:  75.5 kg  BMI:  Body mass index is 28.47 kg/m.  Estimated Nutritional Needs:   Kcal:  2250-2450 kcal  Protein:  115-130 grams  Fluid:  >/= 2.2 L/day   Mariana Single RD, LDN Clinical Nutrition Pager # - [phone]

## 2019-09-21 NOTE — Progress Notes (Signed)
Progress Note  Patient Name: Brandon Lewis Date of Encounter: 09/21/2019  Primary Cardiologist: Corey Skains, MD  Subjective   NG tube placed yesterday for decompression. Passing gas, feels somewhat better today. Pain well controlled.   Inpatient Medications    Scheduled Meds: . acetaminophen  1,000 mg Oral Q6H   Or  . acetaminophen (TYLENOL) oral liquid 160 mg/5 mL  1,000 mg Per Tube Q6H  . aspirin  81 mg Per Tube Daily  . bisacodyl  10 mg Oral Daily   Or  . bisacodyl  10 mg Rectal Daily  . Chlorhexidine Gluconate Cloth  6 each Topical Daily  . clopidogrel  75 mg Per Tube Daily  . colchicine  0.3 mg Per Tube BID  . docusate  200 mg Per Tube Daily  . enoxaparin (LOVENOX) injection  30 mg Subcutaneous QHS  . insulin aspart  0-24 Units Subcutaneous Q4H  . levalbuterol  0.63 mg Nebulization Q6H  . mouth rinse  15 mL Mouth Rinse BID  . metoCLOPramide (REGLAN) injection  5 mg Intravenous Q8H  . multivitamin  15 mL Per Tube Daily  . pantoprazole sodium  40 mg Per Tube Daily  . pneumococcal 23 valent vaccine  0.5 mL Intramuscular Once  . rosuvastatin  20 mg Per Tube q1800  . sodium chloride flush  3 mL Intravenous Q12H  . tamsulosin  0.4 mg Oral Daily   Continuous Infusions: . sodium chloride 10 mL/hr at 09/21/19 0600  . dextrose 5 % and 0.45 % NaCl with KCl 10 mEq/L    . DOPamine 1 mcg/kg/min (09/21/19 0600)  . lactated ringers    . lactated ringers Stopped (09/17/19 1156)  . lactated ringers Stopped (09/17/19 1237)   PRN Meds: lactated ringers, metoprolol tartrate, ondansetron (ZOFRAN) IV, oxyCODONE, phenol, simethicone, sodium chloride flush, traMADol   Vital Signs    Vitals:   09/21/19 0700 09/21/19 0744 09/21/19 0800 09/21/19 0821  BP: (!) 94/59  (!) 111/56   Pulse: 99 (!) 102 (!) 102   Resp: 14 12 14    Temp:    98.4 F (36.9 C)  TempSrc:    Oral  SpO2: 91% 92% 94%   Weight:      Height:        Intake/Output Summary (Last 24 hours) at 09/21/2019  0932 Last data filed at 09/21/2019 [id number] Gross per 24 hour  Intake 1331.6 ml  Output 1570 ml  Net -238.4 ml   Last 3 Weights 09/21/2019 09/20/2019 09/19/2019  Weight (lbs) 198 lb 6.6 oz 199 lb 4.7 oz 197 lb 15.6 oz  Weight (kg) 90 kg 90.4 kg 89.8 kg      Telemetry    Sinus tach in low 100s with intermittent atrial bigeminy - Personally Reviewed  ECG    No new since 11/28 - Personally Reviewed  Physical Exam   GEN: Well nourished, well developed in no acute distress HEENT: Normal, moist mucous membranes. NG tube in place NECK: No JVD visible sitting upright CARDIAC: regular rhythm, normal S1 and S2, no rubs or gallops. No murmur. VASCULAR: Radial pulses 2+ bilaterally. RESPIRATORY:  Clear to auscultation in upper fields, diminished at bases ABDOMEN: tympanic, distended MUSCULOSKELETAL:  Moves all 4 limbs independently SKIN: Warm and dry, trace bilateral LE edema NEUROLOGIC:  Alert and oriented x 3. No focal neuro deficits noted. PSYCHIATRIC:  Normal affect   Labs    High Sensitivity Troponin:   Recent Labs  Lab 09/11/19 0139 09/11/19 0334 09/11/19 0634 09/11/19  0806  TROPONINIHS 17 69* 81* 74*      Chemistry Recent Labs  Lab 09/20/19 0500 09/20/19 1500 09/21/19 0544  NA 135 133* 136  K 4.4 4.3 3.9  CL 104 101 105  CO2 17* 19* 20*  GLUCOSE 125* 144* 119*  BUN 60* 62* 63*  CREATININE 2.04* 2.11* 1.90*  CALCIUM 8.9 9.0 8.8*  PROT  --  6.3*  --   ALBUMIN  --  2.7*  --   AST  --  25  --   ALT  --  23  --   ALKPHOS  --  51  --   BILITOT  --  1.0  --   GFRNONAA 29* 28* 32*  GFRAA 34* 33* 37*  ANIONGAP 14 13 11      Hematology Recent Labs  Lab 09/19/19 0319 09/20/19 0500 09/21/19 0544  WBC 13.7* 14.4* 12.1*  RBC 2.18* 2.48* 2.56*  HGB 7.6* 8.5* 8.6*  HCT 23.7* 27.3* 28.0*  MCV 108.7* 110.1* 109.4*  MCH 34.9* 34.3* 33.6  MCHC 32.1 31.1 30.7  RDW 23.5* 23.3* 22.7*  PLT 105* 126* 155    BNPNo results for input(s): BNP, PROBNP in the last 168  hours.   DDimer No results for input(s): DDIMER in the last 168 hours.   Radiology    Dg Abd 1 View  Result Date: 09/20/2019 CLINICAL DATA:  Status post nasogastric tube placement. EXAM: ABDOMEN - 1 VIEW COMPARISON:  09/20/2019. FINDINGS: No enteric tube visualized on this examination. Right upper extremity PICC with tip terminating in the distal superior vena cava. Abdomen is incompletely imaged, but visualized bowel gas pattern is unremarkable. Status post median sternotomy for CABG. Epicardial pacing wires noted. IMPRESSION: 1. No enteric tube identified. Electronically Signed   By: Vinnie Langton M.D.   On: 09/20/2019 19:10   Dg Abd 1 View  Result Date: 09/20/2019 CLINICAL DATA:  85 year old male with NG tube placement. EXAM: ABDOMEN - 1 VIEW COMPARISON:  Earlier radiograph dated 09/20/2019. FINDINGS: Interval placement of an enteric tube with side-port in the region of the gastroesophageal junction and tip in the proximal stomach. Recommend further advancing of the tube by additional at least 5 cm for optimal positioning. Partially visualized PICC with tip close to the cavoatrial junction. There is mild eventration of the right hemidiaphragm. Median sternotomy wires and CABG vascular clips. Several linear radiopaque foreign objects noted over the upper abdomen. IMPRESSION: 1. Enteric tube with tip in the proximal stomach. Recommend further advancing of the tube by additional 5 cm for optimal positioning. 2. Partially visualized PICC with tip close to the cavoatrial junction. Electronically Signed   By: Anner Crete M.D.   On: 09/20/2019 18:06   Dg Abd 1 View  Result Date: 09/20/2019 CLINICAL DATA:  85 year old male with possible ileus status post heart surgery. EXAM: ABDOMEN - 1 VIEW COMPARISON:  None. FINDINGS: Evaluation is limited due to patient positioning and technique. No dilated bowel loops noted in the visualized abdomen. There is air within the visualized colon. Multiple linear  wires and radiopaque linear needle-like structures noted in the upper abdomen. Clinical correlation is recommended. There is right upper quadrant cholecystectomy clips. Median sternotomy wires and CABG vascular clips noted. Partially visualized central venous line with tip at the cavoatrial junction. Left lung base atelectasis. IMPRESSION: 1. No dilated bowel loops. 2. Multiple linear needle-like structures in the upper abdomen. Clinical correlation is recommended. 3. Left lung base atelectasis. Electronically Signed   By: Laren Everts.D.  On: 09/20/2019 16:46   Dg Chest Port 1 View  Result Date: 09/21/2019 CLINICAL DATA:  CABG 5 days ago. Sore chest. EXAM: PORTABLE CHEST 1 VIEW COMPARISON:  One-view chest x-ray 09/20/2019 FINDINGS: Heart size is exaggerate by low lung volumes. Right-sided PICC line is in place. New NG tube is in place. Bibasilar airspace disease is similar the prior exam, left greater than right. Linear atelectasis or fluid in the minor fissure is again noted. IMPRESSION: 1. Stable bibasilar airspace disease, left greater than right. While this likely reflects atelectasis, infection is not excluded. 2. New right-sided PICC line. Electronically Signed   By: San Morelle M.D.   On: 09/21/2019 08:23   Dg Chest Port 1 View  Result Date: 09/20/2019 CLINICAL DATA:  CABG 4 days ago. Sore chest. EXAM: PORTABLE CHEST 1 VIEW COMPARISON:  One-view chest x-ray 09/19/2019 FINDINGS: The right IJ sheath has been removed. Mediastinal drains and left-sided chest tube were removed. There is no pneumothorax or pneumomediastinum. Right-sided PICC line remains. Heart is enlarged. Lung volumes remain low. Small effusions remain, left greater than right. Bibasilar airspace opacities are similar the prior exam, left greater than right. There is minimal fluid within the right minor fissure. IMPRESSION: 1. Interval removal of mediastinal drains and left-sided chest tube without pneumothorax or  pneumomediastinum. 2. Stable bibasilar airspace disease, left greater than right. This likely reflects atelectasis. Electronically Signed   By: San Morelle M.D.   On: 09/20/2019 07:47    Cardiac Studies   Echo limited 09/19/19  1. Left ventricular ejection fraction, by visual estimation, is 70 to 75%. The left ventricle has hyperdynamic function. There is no left ventricular hypertrophy.  2. Global right ventricle has normal systolic function.The right ventricular size is normal. No increase in right ventricular wall thickness.  3. Left atrial size was normal.  4. Right atrial size was normal.  5. Mild mitral annular calcification.  6. The mitral valve is normal in structure. No evidence of mitral valve regurgitation.  7. The tricuspid valve is normal in structure. Tricuspid valve regurgitation is not demonstrated.  8. The aortic valve is tricuspid. Aortic valve regurgitation is not visualized. Mild aortic valve sclerosis without stenosis.  9. The pulmonic valve was not well visualized. Pulmonic valve regurgitation is not visualized.  Left heart catheterization 09/12/2019:  Mid LM to Dist LM lesion is 85% stenosed.  Prox Cx lesion is 50% stenosed.  Prox LAD to Mid LAD lesion is 85% stenosed.  Prox LAD lesion is 70% stenosed.  Mid LAD lesion is 45% stenosed.  Prox RCA to Mid RCA lesion is 70% stenosed.  Patient Profile     85 y.o. male with CAD s/p prior stent who presented with NSTEMI, now s/p CABG 11/27. CV risk factors of PAD s/p aortobifemoral bypass, HTN, HLD.  Assessment & Plan    Abdominal pain/distension: now with NG tube for decompression Atelectasis: needs to continue to incentive spirometry  Severe multivessel CAD, now s/p 4V CABG 09/16/19 by Dr. Orvan Seen (LIMA-LAD, SVG-OM, SVG-Diag, AVG-RCA) -continue aspirin 81 mg  -given NSTEMI, would use DAPT for 12 mos. Continue clopidogrel -continue rosuvastatin 20 mg -no beta blocker yet as he is still on dopamine  (low dose) -Wt today 90 kg (yesterday 90.4), admission weight 89.7 kg, lowest wt this admission 87.5 kg. See below re: kidneys/diuresis -incentive spirometry, mobilization as able  Hypertension: holding home meds post op -was on metoprolol succinate 50 mg daily and losartan 100 mg daily prior to admission -holding while on  dopamine, will add back when able.  Hyperlipidemia -continue rosuvastatin, increased to 20 mg this admission  PAD: warm feet, cannot feel LE pulses on exam. Continue secondary prevention as above  Acute on chronic kidney disease -Cr today 1.90 today, downtrending. 1.64 on admission; peak 2.4, nadir 1.33.  -agree with gentle diuresis and mobilization to improve edema -albumin low at 2.7, likely exacerbating fluid retention.  Chronic anemia/thrombocytopenia -Hgb baseline ~10. Suspect acute blood loss anemia from surgery superimposed on anemia of chronic disease.  -no clear active blood loss -Hgb increased from 7.6 to 8.5 after 1 unit PRBC 11/30. Stable. -platelets 155 from 105, had been ~140s, monitor  For questions or updates, please contact [place] Please consult [email or web address] for contact info under     Signed, Buford Dresser, MD  09/21/2019, 9:32 AM

## 2019-09-21 NOTE — Progress Notes (Addendum)
CTS PM Rounds  Patient breathing comfortably NG out, abdomen soft with bowel sounds- some flatus PM labs- creatinine slightly improved 1.79  Blood pressure (!) 95/54, pulse (!) 107, temperature 98.8 F (37.1 C), temperature source Oral, resp. rate 16, height 5\' 10"  (1.778 m), weight 90 kg, SpO2 93 %.

## 2019-09-22 DIAGNOSIS — R062 Wheezing: Secondary | ICD-10-CM

## 2019-09-22 DIAGNOSIS — I493 Ventricular premature depolarization: Secondary | ICD-10-CM

## 2019-09-22 LAB — COMPREHENSIVE METABOLIC PANEL
ALT: 26 U/L (ref 0–44)
AST: 34 U/L (ref 15–41)
Albumin: 2.3 g/dL — ABNORMAL LOW (ref 3.5–5.0)
Alkaline Phosphatase: 46 U/L (ref 38–126)
Anion gap: 8 (ref 5–15)
BUN: 53 mg/dL — ABNORMAL HIGH (ref 8–23)
CO2: 23 mmol/L (ref 22–32)
Calcium: 8.6 mg/dL — ABNORMAL LOW (ref 8.9–10.3)
Chloride: 108 mmol/L (ref 98–111)
Creatinine, Ser: 1.74 mg/dL — ABNORMAL HIGH (ref 0.61–1.24)
GFR calc Af Amer: 41 mL/min — ABNORMAL LOW (ref >60)
GFR calc non Af Amer: 35 mL/min — ABNORMAL LOW (ref >60)
Glucose, Bld: 132 mg/dL — ABNORMAL HIGH (ref 70–99)
Potassium: 4 mmol/L (ref 3.5–5.1)
Sodium: 139 mmol/L (ref 135–145)
Total Bilirubin: 0.9 mg/dL (ref 0.3–1.2)
Total Protein: 5.5 g/dL — ABNORMAL LOW (ref 6.5–8.1)

## 2019-09-22 LAB — GLUCOSE, CAPILLARY
Glucose-Capillary: 110 mg/dL — ABNORMAL HIGH (ref 70–99)
Glucose-Capillary: 127 mg/dL — ABNORMAL HIGH (ref 70–99)
Glucose-Capillary: 120 mg/dL — ABNORMAL HIGH (ref 70–99)
Glucose-Capillary: 132 mg/dL — ABNORMAL HIGH (ref 70–99)
Glucose-Capillary: 157 mg/dL — ABNORMAL HIGH (ref 70–99)

## 2019-09-22 MED ORDER — DOCUSATE SODIUM 100 MG PO CAPS
200.0000 mg | ORAL_CAPSULE | Freq: Every day | ORAL | Status: DC
  Administered 2019-09-26 – 2019-10-01 (×6): 200 mg via ORAL
  Administered 2019-10-02: 100 mg via ORAL
  Filled 2019-09-22 (×8): qty 2

## 2019-09-22 MED ORDER — PANTOPRAZOLE SODIUM 40 MG PO TBEC
40.0000 mg | DELAYED_RELEASE_TABLET | Freq: Every day | ORAL | Status: DC
  Administered 2019-09-22 – 2019-10-02 (×11): 40 mg via ORAL
  Filled 2019-09-22 (×11): qty 1

## 2019-09-22 MED ORDER — ADULT MULTIVITAMIN W/MINERALS CH
1.0000 | ORAL_TABLET | Freq: Every day | ORAL | Status: DC
  Administered 2019-09-22 – 2019-10-02 (×11): 1 via ORAL
  Filled 2019-09-22 (×11): qty 1

## 2019-09-22 MED ORDER — ALBUTEROL SULFATE (2.5 MG/3ML) 0.083% IN NEBU
2.5000 mg | INHALATION_SOLUTION | Freq: Four times a day (QID) | RESPIRATORY_TRACT | Status: DC | PRN
  Administered 2019-09-22 – 2019-09-27 (×5): 2.5 mg via RESPIRATORY_TRACT
  Filled 2019-09-22 (×5): qty 3

## 2019-09-22 MED ORDER — ASPIRIN 81 MG PO CHEW
81.0000 mg | CHEWABLE_TABLET | Freq: Every day | ORAL | Status: DC
  Administered 2019-09-23 – 2019-10-02 (×10): 81 mg via ORAL
  Filled 2019-09-22 (×10): qty 1

## 2019-09-22 MED ORDER — SIMETHICONE 80 MG PO CHEW: 80.0000 mg | CHEWABLE_TABLET | Freq: Four times a day (QID) | ORAL | Status: DC | PRN

## 2019-09-22 MED ORDER — METOPROLOL TARTRATE 12.5 MG HALF TABLET
12.5000 mg | ORAL_TABLET | Freq: Two times a day (BID) | ORAL | Status: DC
  Administered 2019-09-22 – 2019-09-23 (×3): 12.5 mg via ORAL
  Filled 2019-09-22 (×3): qty 1

## 2019-09-22 MED ORDER — ROSUVASTATIN CALCIUM 20 MG PO TABS
20.0000 mg | ORAL_TABLET | Freq: Every day | ORAL | Status: DC
  Administered 2019-09-22 – 2019-10-01 (×10): 20 mg via ORAL
  Filled 2019-09-22 (×10): qty 1

## 2019-09-22 MED ORDER — CLOPIDOGREL BISULFATE 75 MG PO TABS
75.0000 mg | ORAL_TABLET | Freq: Every day | ORAL | Status: DC
  Administered 2019-09-23 – 2019-10-02 (×10): 75 mg via ORAL
  Filled 2019-09-22 (×10): qty 1

## 2019-09-22 NOTE — Progress Notes (Addendum)
Progress Note  Patient Name: Brandon Lewis Date of Encounter: 09/22/2019  Primary Cardiologist: Corey Skains, MD  Subjective   Seen up and ambulating with PT this AM. NG tube out, feeling better. Had BM this morning. Pain well controlled. Is wheezing audibly this AM. Also has some upper left arm pain where BP cuff had been.  Inpatient Medications    Scheduled Meds: . aspirin  81 mg Per Tube Daily  . bisacodyl  10 mg Oral Daily   Or  . bisacodyl  10 mg Rectal Daily  . Chlorhexidine Gluconate Cloth  6 each Topical Daily  . clopidogrel  75 mg Per Tube Daily  . docusate  200 mg Per Tube Daily  . enoxaparin (LOVENOX) injection  30 mg Subcutaneous QHS  . insulin aspart  0-24 Units Subcutaneous Q4H  . mouth rinse  15 mL Mouth Rinse BID  . metoCLOPramide (REGLAN) injection  5 mg Intravenous Q8H  . multivitamin  15 mL Per Tube Daily  . pantoprazole sodium  40 mg Per Tube Daily  . pneumococcal 23 valent vaccine  0.5 mL Intramuscular Once  . rosuvastatin  20 mg Per Tube q1800  . sodium chloride flush  3 mL Intravenous Q12H  . tamsulosin  0.4 mg Oral Daily   Continuous Infusions: . sodium chloride Stopped (09/22/19 0123)  . dextrose 5 % and 0.45 % NaCl with KCl 10 mEq/L 50 mL/hr at 09/22/19 0551  . lactated ringers    . lactated ringers Stopped (09/17/19 1156)  . lactated ringers Stopped (09/17/19 1237)   PRN Meds: lactated ringers, metoprolol tartrate, ondansetron (ZOFRAN) IV, oxyCODONE, phenol, simethicone, sodium chloride flush, traMADol   Vital Signs    Vitals:   09/22/19 0315 09/22/19 0400 09/22/19 0500 09/22/19 0802  BP: (!) 132/55 131/71 134/67   Pulse: (!) 105 (!) 101 (!) 47   Resp: (!) 21 17 (!) 21   Temp:  98.6 F (37 C)  98.5 F (36.9 C)  TempSrc:  Oral  Oral  SpO2: 93% 95% 97%   Weight:   92.6 kg   Height:        Intake/Output Summary (Last 24 hours) at 09/22/2019 0914 Last data filed at 09/22/2019 0500 Gross per 24 hour  Intake 1192.03 ml   Output 450 ml  Net 742.03 ml   Last 3 Weights 09/22/2019 09/21/2019 09/20/2019  Weight (lbs) 204 lb 2.3 oz 198 lb 6.6 oz 199 lb 4.7 oz  Weight (kg) 92.6 kg 90 kg 90.4 kg      Telemetry    Sinus tach in low 100s, frequent PVCs, occasional ventricular bigeminy - Personally Reviewed  ECG    No new since 11/28 - Personally Reviewed  Physical Exam   GEN: Well nourished, well developed in no acute distress HEENT: Normal, moist mucous membranes NECK: No JVD CARDIAC: regular rhythm, normal S1 and S2, no rubs or gallops. No murmur. VASCULAR: Radial and DP pulses 2+ bilaterally. No carotid bruits RESPIRATORY:  Clear to auscultation without rales. Has diffuse fine expiratory wheeze. Diminished at bases ABDOMEN: Soft, non-tender, non-distended, +BS MUSCULOSKELETAL:  Ambulated with roman walker today SKIN: Warm and dry, trace BL LE edema NEUROLOGIC:  Alert and oriented x 3. No focal neuro deficits noted. PSYCHIATRIC:  Normal affect   Labs    High Sensitivity Troponin:   Recent Labs  Lab 09/11/19 0139 09/11/19 0334 09/11/19 0634 09/11/19 0806  TROPONINIHS 17 69* 81* 74*      Chemistry Recent Labs  Lab  09/20/19 1500 09/21/19 0544 09/21/19 1714 09/22/19 0411  NA 133* 136 138 139  K 4.3 3.9 3.8 4.0  CL 101 105 108 108  CO2 19* 20* 20* 23  GLUCOSE 144* 119* 167* 132*  BUN 62* 63* 58* 53*  CREATININE 2.11* 1.90* 1.79* 1.74*  CALCIUM 9.0 8.8* 8.6* 8.6*  PROT 6.3*  --  5.4* 5.5*  ALBUMIN 2.7*  --  2.3* 2.3*  AST 25  --  28 34  ALT 23  --  22 26  ALKPHOS 51  --  50 46  BILITOT 1.0  --  0.8 0.9  GFRNONAA 28* 32* 34* 35*  GFRAA 33* 37* 40* 41*  ANIONGAP 13 11 10 8      Hematology Recent Labs  Lab 09/19/19 0319 09/20/19 0500 09/21/19 0544  WBC 13.7* 14.4* 12.1*  RBC 2.18* 2.48* 2.56*  HGB 7.6* 8.5* 8.6*  HCT 23.7* 27.3* 28.0*  MCV 108.7* 110.1* 109.4*  MCH 34.9* 34.3* 33.6  MCHC 32.1 31.1 30.7  RDW 23.5* 23.3* 22.7*  PLT 105* 126* 155    BNPNo results for  input(s): BNP, PROBNP in the last 168 hours.   DDimer No results for input(s): DDIMER in the last 168 hours.   Radiology    Dg Abd 1 View  Result Date: 09/20/2019 CLINICAL DATA:  Status post nasogastric tube placement. EXAM: ABDOMEN - 1 VIEW COMPARISON:  09/20/2019. FINDINGS: No enteric tube visualized on this examination. Right upper extremity PICC with tip terminating in the distal superior vena cava. Abdomen is incompletely imaged, but visualized bowel gas pattern is unremarkable. Status post median sternotomy for CABG. Epicardial pacing wires noted. IMPRESSION: 1. No enteric tube identified. Electronically Signed   By: Vinnie Langton M.D.   On: 09/20/2019 19:10   Dg Abd 1 View  Result Date: 09/20/2019 CLINICAL DATA:  85 year old male with NG tube placement. EXAM: ABDOMEN - 1 VIEW COMPARISON:  Earlier radiograph dated 09/20/2019. FINDINGS: Interval placement of an enteric tube with side-port in the region of the gastroesophageal junction and tip in the proximal stomach. Recommend further advancing of the tube by additional at least 5 cm for optimal positioning. Partially visualized PICC with tip close to the cavoatrial junction. There is mild eventration of the right hemidiaphragm. Median sternotomy wires and CABG vascular clips. Several linear radiopaque foreign objects noted over the upper abdomen. IMPRESSION: 1. Enteric tube with tip in the proximal stomach. Recommend further advancing of the tube by additional 5 cm for optimal positioning. 2. Partially visualized PICC with tip close to the cavoatrial junction. Electronically Signed   By: Anner Crete M.D.   On: 09/20/2019 18:06   Dg Abd 1 View  Result Date: 09/20/2019 CLINICAL DATA:  85 year old male with possible ileus status post heart surgery. EXAM: ABDOMEN - 1 VIEW COMPARISON:  None. FINDINGS: Evaluation is limited due to patient positioning and technique. No dilated bowel loops noted in the visualized abdomen. There is air within  the visualized colon. Multiple linear wires and radiopaque linear needle-like structures noted in the upper abdomen. Clinical correlation is recommended. There is right upper quadrant cholecystectomy clips. Median sternotomy wires and CABG vascular clips noted. Partially visualized central venous line with tip at the cavoatrial junction. Left lung base atelectasis. IMPRESSION: 1. No dilated bowel loops. 2. Multiple linear needle-like structures in the upper abdomen. Clinical correlation is recommended. 3. Left lung base atelectasis. Electronically Signed   By: Anner Crete M.D.   On: 09/20/2019 16:46   Dg Chest Port 1  View  Result Date: 09/21/2019 CLINICAL DATA:  CABG 5 days ago. Sore chest. EXAM: PORTABLE CHEST 1 VIEW COMPARISON:  One-view chest x-ray 09/20/2019 FINDINGS: Heart size is exaggerate by low lung volumes. Right-sided PICC line is in place. New NG tube is in place. Bibasilar airspace disease is similar the prior exam, left greater than right. Linear atelectasis or fluid in the minor fissure is again noted. IMPRESSION: 1. Stable bibasilar airspace disease, left greater than right. While this likely reflects atelectasis, infection is not excluded. 2. New right-sided PICC line. Electronically Signed   By: San Morelle M.D.   On: 09/21/2019 08:23    Cardiac Studies   Echo limited 09/19/19  1. Left ventricular ejection fraction, by visual estimation, is 70 to 75%. The left ventricle has hyperdynamic function. There is no left ventricular hypertrophy.  2. Global right ventricle has normal systolic function.The right ventricular size is normal. No increase in right ventricular wall thickness.  3. Left atrial size was normal.  4. Right atrial size was normal.  5. Mild mitral annular calcification.  6. The mitral valve is normal in structure. No evidence of mitral valve regurgitation.  7. The tricuspid valve is normal in structure. Tricuspid valve regurgitation is not demonstrated.   8. The aortic valve is tricuspid. Aortic valve regurgitation is not visualized. Mild aortic valve sclerosis without stenosis.  9. The pulmonic valve was not well visualized. Pulmonic valve regurgitation is not visualized.  Left heart catheterization 09/12/2019:  Mid LM to Dist LM lesion is 85% stenosed.  Prox Cx lesion is 50% stenosed.  Prox LAD to Mid LAD lesion is 85% stenosed.  Prox LAD lesion is 70% stenosed.  Mid LAD lesion is 45% stenosed.  Prox RCA to Mid RCA lesion is 70% stenosed.  Patient Profile     85 y.o. male with CAD s/p prior stent who presented with NSTEMI, now s/p CABG 11/27. CV risk factors of PAD s/p aortobifemoral bypass, HTN, HLD.  Assessment & Plan    Abdominal pain/distension: NG tube removed, improved. Remains NPO, advance per surgery team. Had BM this AM Atelectasis: needs to continue to incentive spirometry Wheezing: no history/no home inhalers, but will give neb treatments today  Severe multivessel CAD, now s/p 4V CABG 09/16/19 by Dr. Orvan Seen (LIMA-LAD, SVG-OM, SVG-Diag, AVG-RCA) -continue aspirin 81 mg  -given NSTEMI, would use DAPT for 12 mos. Continue clopidogrel -continue rosuvastatin 20 mg -will add beta blocker back first, see below -Wt today 92.6 kg (yesterday 90), admission weight 89.7 kg, lowest wt this admission 87.5 kg. See below re: kidneys/diuresis -incentive spirometry, mobilization as able  PVCs: would like to add beta blocker back. Has been off dopamine less than 24 hours, but BP remains stable. Will start with very low dose and monitor  Hypertension: holding home meds post op -was on metoprolol succinate 50 mg daily and losartan 100 mg daily prior to admission -start with metoprolol for PVCs, as above  Hyperlipidemia -continue rosuvastatin, increased to 20 mg this admission  PAD: warm feet, cannot feel LE pulses on exam. Continue secondary prevention as above  Acute on chronic kidney disease -Cr today 1.74 today, downtrending.  1.64 on admission; peak 2.4, nadir 1.33.  -agree w/mobilization. CVP 9, monitor, not currently on diuretic -albumin low at 2.7, likely exacerbating fluid retention.  Chronic anemia/thrombocytopenia: no CBC updated today, but no active bleeding -Hgb baseline ~10. Suspect acute blood loss anemia from surgery superimposed on anemia of chronic disease.  -no clear active blood loss -Hgb  increased from 7.6 to 8.5 after 1 unit PRBC 11/30. Stable. -platelets 155 from 105, had been ~140s, monitor  For questions or updates, please contact [place] Please consult [email or web address] for contact info under     Signed, Buford Dresser, MD  09/22/2019, 9:14 AM

## 2019-09-22 NOTE — Progress Notes (Signed)
6 Days Post-Op Procedure(s) (LRB): CORONARY ARTERY BYPASS GRAFTING (CABG) using LIMA to LAD; Endoscopic right saphenous vein harvest to OM1, Diag1, and RCA. (N/A) TRANSESOPHAGEAL ECHOCARDIOGRAM (TEE) (N/A) INDOCYANINE GREEN FLUORESCENCE IMAGING (ICG) (N/A) Subjective: Feeling better  Objective: Vital signs in last 24 hours: Temp:  [97.6 F (36.4 C)-99.2 F (37.3 C)] 98.7 F (37.1 C) (12/03 1616) Pulse Rate:  [47-112] 109 (12/03 1600) Cardiac Rhythm: Sinus tachycardia (12/03 1600) Resp:  [15-26] 19 (12/03 1600) BP: (94-159)/(44-106) 127/58 (12/03 1600) SpO2:  [93 %-100 %] 100 % (12/03 1600) Weight:  [92.6 kg] 92.6 kg (12/03 0500)  Hemodynamic parameters for last 24 hours:    Intake/Output from previous day: 12/02 0701 - 12/03 0700 In: 1215.4 [I.V.:1165.4; IV Piggyback:50] Out: 650 [Urine:650] Intake/Output this shift: Total I/O In: 499.9 [I.V.:499.9] Out: 300 [Urine:300]  General appearance: alert and cooperative Neurologic: intact Heart: regular rate and rhythm, S1, S2 normal, no murmur, click, rub or gallop Lungs: clear to auscultation bilaterally Abdomen: soft, non-tender; bowel sounds normal; no masses,  no organomegaly Extremities: extremities normal, atraumatic, no cyanosis or edema Wound: c/d/i  Lab Results: Recent Labs    09/20/19 0500 09/21/19 0544  WBC 14.4* 12.1*  HGB 8.5* 8.6*  HCT 27.3* 28.0*  PLT 126* 155   BMET:  Recent Labs    09/21/19 1714 09/22/19 0411  NA 138 139  K 3.8 4.0  CL 108 108  CO2 20* 23  GLUCOSE 167* 132*  BUN 58* 53*  CREATININE 1.79* 1.74*  CALCIUM 8.6* 8.6*    PT/INR: No results for input(s): LABPROT, INR in the last 72 hours. ABG    Component Value Date/Time   PHART 7.313 (L) 09/16/2019 1922   HCO3 16.2 (L) 09/16/2019 1922   TCO2 17 (L) 09/16/2019 1922   ACIDBASEDEF 9.0 (H) 09/16/2019 1922   O2SAT 96.0 09/16/2019 1922   CBG (last 3)  Recent Labs    09/22/19 0759 09/22/19 1235 09/22/19 1608  GLUCAP 127*  120* 132*    Assessment/Plan: S/P Procedure(s) (LRB): CORONARY ARTERY BYPASS GRAFTING (CABG) using LIMA to LAD; Endoscopic right saphenous vein harvest to OM1, Diag1, and RCA. (N/A) TRANSESOPHAGEAL ECHOCARDIOGRAM (TEE) (N/A) INDOCYANINE GREEN FLUORESCENCE IMAGING (ICG) (N/A) Mobilize ok to resume diet   LOS: 10 days    Wonda Olds 09/22/2019

## 2019-09-23 ENCOUNTER — Inpatient Hospital Stay (HOSPITAL_COMMUNITY): Payer: Medicare Other

## 2019-09-23 LAB — GLUCOSE, CAPILLARY
Glucose-Capillary: 111 mg/dL — ABNORMAL HIGH (ref 70–99)
Glucose-Capillary: 112 mg/dL — ABNORMAL HIGH (ref 70–99)
Glucose-Capillary: 118 mg/dL — ABNORMAL HIGH (ref 70–99)
Glucose-Capillary: 147 mg/dL — ABNORMAL HIGH (ref 70–99)
Glucose-Capillary: 214 mg/dL — ABNORMAL HIGH (ref 70–99)
Glucose-Capillary: 90 mg/dL (ref 70–99)

## 2019-09-23 LAB — BASIC METABOLIC PANEL
Anion gap: 11 (ref 5–15)
BUN: 42 mg/dL — ABNORMAL HIGH (ref 8–23)
CO2: 20 mmol/L — ABNORMAL LOW (ref 22–32)
Calcium: 8.7 mg/dL — ABNORMAL LOW (ref 8.9–10.3)
Chloride: 107 mmol/L (ref 98–111)
Creatinine, Ser: 1.44 mg/dL — ABNORMAL HIGH (ref 0.61–1.24)
GFR calc Af Amer: 52 mL/min — ABNORMAL LOW (ref >60)
GFR calc non Af Amer: 45 mL/min — ABNORMAL LOW (ref >60)
Glucose, Bld: 152 mg/dL — ABNORMAL HIGH (ref 70–99)
Potassium: 4 mmol/L (ref 3.5–5.1)
Sodium: 138 mmol/L (ref 135–145)

## 2019-09-23 MED ORDER — METOPROLOL TARTRATE 12.5 MG HALF TABLET
12.5000 mg | ORAL_TABLET | Freq: Four times a day (QID) | ORAL | Status: DC
  Administered 2019-09-23 – 2019-09-25 (×8): 12.5 mg via ORAL
  Filled 2019-09-23 (×8): qty 1

## 2019-09-23 MED ORDER — FUROSEMIDE 10 MG/ML IJ SOLN
40.0000 mg | Freq: Once | INTRAMUSCULAR | Status: AC
  Administered 2019-09-23: 40 mg via INTRAVENOUS
  Filled 2019-09-23: qty 4

## 2019-09-23 MED ORDER — BOOST / RESOURCE BREEZE PO LIQD CUSTOM
1.0000 | Freq: Three times a day (TID) | ORAL | Status: DC
  Administered 2019-09-23 – 2019-10-01 (×17): 1 via ORAL

## 2019-09-23 NOTE — Discharge Instructions (Signed)

## 2019-09-23 NOTE — Plan of Care (Signed)
°  Problem: Clinical Measurements: °Goal: Respiratory complications will improve °Outcome: Progressing °Goal: Cardiovascular complication will be avoided °Outcome: Progressing °  °Problem: Activity: °Goal: Risk for activity intolerance will decrease °Outcome: Progressing °  °

## 2019-09-23 NOTE — Progress Notes (Signed)
Patient requesting simethicone for flatulence. Order has been d/c'd.  Called on call Dr. Orvan Seen to see if the order can be renewed. Dr. Orvan Seen stated that the patient did not need it and it didn't seem to be doing any good. Let the patient know what the doctor said. Will continue to monitor

## 2019-09-23 NOTE — Progress Notes (Addendum)
1215 Bedside shift report, pt getting bath by NT. Pt sitting up in recliner. Exp wheezes noted. Pt denies pain, SOB. Fall precautions in place, [facility].   1340 Pt medicated per MAR, repositioned in the chair, resting comfortably. WCTM  9579 Pt sleeping comfortably, NAD, fall precautions in place.  1600 RN walked patient around hallways, tolerated well. Report called to Sharyn Lull, RN on 4E. Pt packed up, cell phone, charger, and earphones taken with pt to new room.  1645 Pt transported via wheelchair to 4E02, Sharyn Lull, RN met RN and pt in room.

## 2019-09-23 NOTE — Discharge Summary (Signed)
[address]       [place] [address]             [phone]      Physician Discharge Summary   Patient ID:  Brandon Lewis MRN: [id number] DOB/AGE: 1933/10/28 85 y.o.  Admit date: 09/12/2019 Discharge date: 10/02/2019  Admission Diagnoses: Patient Active Problem List   Diagnosis Date Noted  . S/P CABG x 4   . Atrial flutter ([place])   . Coronary artery disease 09/16/2019  . NSTEMI (non-ST elevated myocardial infarction) ([place]) 09/12/2019  . Allergic rhinitis 09/12/2019  . Unstable angina ([place]) 09/12/2019  . Coronary artery disease due to lipid rich plaque   . Essential hypertension   . AKI (acute kidney injury) ([place])   . Sciatica, right side 09/22/2018  . Chronic renal disease, stage III 09/14/2018  . Right hip pain 09/13/2018  . Thrombocytopenia ([place]) 08/25/2017  . Actinic keratosis 08/14/2015  . Advanced directives, counseling/discussion 07/25/2014  . Routine general medical examination at a health care facility 06/25/2012  . IMPAIRED FASTING GLUCOSE 03/08/2010  . CAD, NATIVE VESSEL 05/30/2009  . Hyperlipemia 03/02/2009  . Essential hypertension, benign 09/22/2007  . Osteoarthritis, generalized 09/22/2007    Discharge Diagnoses:  Active Problems:   Unstable angina (HCC)   Coronary artery disease due to lipid rich plaque   Essential hypertension   AKI (acute kidney injury) ([place])   Coronary artery disease   S/P CABG x 4   Atrial flutter [facility])   Discharged Condition: good  HPI:   85 yo man with history of CAD s/p PCI and PVD s/p ABF was awoken from sleep last Sunday with chest pain. This was severe enough to call 911. Presented to [facility] ED and ruled in for NSTEMI. Underwent LHC which showed severe 3V CAD including 85% LM disease. Txferred to [facility] for CABG consult. He has been worked up thoroughly and presents for CABG.   Hospital Course:   The patient underwent a CABG x 4 on 09/16/2019. He tolerated the procedure well and was  transferred to the ICU. He was extubated in a timely manner. POD 1 he is off Levo. Pacer was decreased to a backup rate of 60. We discontinued his swan-ganz catheter. We followed his creatinine. We will keep the chest tubes for now  Due output. Lasix was ordered for fluid overload. He remained sore but we continued to mobilize the patient. He continued to make good process. We continued to wean dopamine as tolerated. POD 4 he has some abdominal distention and an NG tube was placed. We pulled the NG on 12/2 and he was breathing comfortably. He had good abdominal sounds and some flatus. We resumed his diet on 12/3. His post-op ileus continued to improve. He was tolerating an oral diet and is ready for transfer to the floor. He continued to progress on the floor. Discontinue epicardial pacing wires on 12/7. He continued physical therapy and we consulted CIR. He was not accepted by CIR, therefore we planned for him to go home with home health and home PT. His creatinine continued to rise so we continued to avoid nephrotoxic medications and ordered serial BMPs to trend. We stopped his lasix at this time to help with his rising creatinine.  His weight continued to trend down and he had excellent urine output. Today, he is tolerating room air, his incisions are healing well, he is ambulating with limited assistance, and he is ready for discharge home.  Consults: cardiology  Significant Diagnostic Studies:   CLINICAL DATA:  Bypass surgery.  EXAM: CHEST - 2 VIEW  COMPARISON:  Chest x-ray 09/21/2019  FINDINGS: The right PICC line is stable.  The NG tube has been removed.  The heart is mildly enlarged. Stable mild tortuosity and calcification of the thoracic aorta.  Improved lung aeration with better lung volumes. No edema or effusions.  IMPRESSION: Improved lung aeration.  No edema or effusions.   Electronically Signed   By: Marijo Sanes M.D.   On: 09/23/2019 10:23  Treatments:    Date of Procedure:    09/16/2019  Preoperative Diagnosis:      Severe 3-vessel Coronary Artery Disease including LM CAD s/p NSTEMI  Postoperative Diagnosis:    Same  Procedure:        Coronary Artery Bypass Grafting x 4              Left Internal Mammary Artery to Distal Left Anterior Descending Coronary Artery;; Saphenous Vein Graft to Posterior Descending Coronary Artery; Saphenous Vein Graft to  Obtuse Marginal Branch of Left Circumflex Coronary Artery and 1st Diagonal Branch Coronary Artery as sequential graft; Endoscopic Vein Harvest from right Thigh and Lower Leg Epiaortic ultrasonography Completion indocyanine green fluorescence imaging (SPY) Surgeon:        B. Murvin Natal, MD  Assistant:       Josie Saunders PA-C  Anesthesia:    get  Operative Findings: ? preserved left ventricular systolic function ? good quality left internal mammary artery conduit ? good quality saphenous vein conduit ? good quality target vessels for grafting    BRIEF CLINICAL NOTE AND INDICATIONS FOR SURGERY  85 yo man awoke with rest angina approximately 5 nights ago; presented to local ED where NSTEMI diagnosed. LHC performed to assess, demonstrating 3V CAD including LM cad. Presents for CABG now after thorough preoperative evaluation and discussion of risks/benefits.   Discharge Exam: Blood pressure 120/68, pulse 94, temperature 97.9 F (36.6 C), temperature source Oral, resp. rate 20, height 5\' 10"  (1.778 m), weight 90.2 kg, SpO2 98 %.   Physical Exam:  General appearance:alert, cooperative and mild distress Neurologic:intact Heart:regular rate and rhythm andMonitor shows stable SR. Lungs:Breath sound are clear. Extremities:1+ LE edema. Wound:the sternal incision is intact and dry, covered with Dermabond  Disposition:   Discharge Instructions    Amb Referral to Cardiac Rehabilitation   Complete by: As directed    Diagnosis:  NSTEMI CABG     CABG X ___: 4   After  initial evaluation and assessments completed: Virtual Based Care may be provided alone or in conjunction with Phase 2 Cardiac Rehab based on patient barriers.: Yes     Allergies as of 10/02/2019      Reactions   Cilostazol Other (See Comments)   REACTION: stomach problems- constipation   Simvastatin Other (See Comments)   REACTION: myalgia      Medication List    TAKE these medications   amiodarone 200 MG tablet Commonly known as: PACERONE Take 1 tablet (200 mg total) by mouth 2 (two) times daily. Take one tablet by mouth TWICE daily for 7 days  then take one tablet by mouth ONCE daily.   aspirin 81 MG chewable tablet Chew 1 tablet (81 mg total) by mouth daily. Start taking on: October 03, 2019   cholecalciferol 1000 units tablet Commonly known as: VITAMIN D Take 1,000 Units by mouth at bedtime.   clopidogrel 75 MG tablet Commonly known as: PLAVIX TAKE ONE  TABLET BY MOUTH EVERY DAY *BOTTLE* What changed: See the new instructions.   furosemide 40 MG tablet Commonly known as: Lasix Take 1 tablet (40 mg total) by mouth daily for 7 days.   gentamicin cream 0.1 % Commonly known as: GARAMYCIN Apply 1 application topically 2 (two) times daily.   losartan 100 MG tablet Commonly known as: COZAAR TAKE ONE TABLET BY MOUTH EACH MORNING FOR BLOOD PRESSURE CONTROL. *BOTTLE* *BOTTLE* What changed: See the new instructions.   magnesium oxide 400 MG tablet Commonly known as: MAG-OX Take 400 mg by mouth at bedtime.   metoprolol succinate 50 MG 24 hr tablet Commonly known as: TOPROL-XL **VIAL ONLY** TAKE (1) TABLET BY MOUTH ONCE A DAY.DO NOT CRUSH. (HIGHBLOOD PRESSURE) *BOTTLE* What changed: See the new instructions.   multivitamin with minerals Tabs tablet Take 1 tablet by mouth every morning.   potassium chloride SA 20 MEQ tablet Commonly known as: KLOR-CON Take 1 tablet (20 mEq total) by mouth daily. Start taking on: October 03, 2019   rosuvastatin 10 MG tablet  Commonly known as: CRESTOR **VIAL ONLY** TAKE ONE TABLET BY MOUTH AT BEDTIME. (IMPROVES CHOLESTEROL) What changed: See the new instructions.   sodium chloride inhaler solution Commonly known as: BRONCHO SALINE Take 1 spray by nebulization daily as needed (congestion).   tamsulosin 0.4 MG Caps capsule Commonly known as: FLOMAX Take 1 capsule (0.4 mg total) by mouth daily. Start taking on: October 03, 2019   traMADol 50 MG tablet Commonly known as: ULTRAM Take 1 tablet (50 mg total) by mouth every 6 (six) hours as needed for up to 7 days for moderate pain.            Durable Medical Equipment  (From admission, onward)         Start     Ordered   09/27/19 0836  For home use only DME 3 n 1  Once     09/27/19 0836   09/26/19 0821  For home use only DME Walker rolling  Once    Question:  Patient needs a walker to treat with the following condition  Answer:  Physical deconditioning   09/26/19 0820        The patient has been discharged on:   1.Beta Blocker:  Yes [ x  ]                              No   [   ]                              If No, reason:  2.Ace Inhibitor/ARB: Yes [   ]                                     No  [ x   ]                                     If No, reason: normotensive  3.Statin:   Yes [ x  ]                  No  [   ]  If No, reason:  4.Ecasa:  Yes  [ x  ]                  No   [   ]                  If No, reason:   Follow-up Information    Venia Carbon, MD. Call in 1 day(s).   Specialties: Internal Medicine, Pediatrics Contact information: [address] [place] [address] [phone]        Corey Skains, MD Follow up.   Specialty: Cardiology Why: call this office after discharge.  Contact information: [address] [facility] [place] [address] [place], [facility] Follow up.   Specialty: Home Health Services Why: RN PT OT  Contact information: [place] [phone]        Wonda Olds, MD. Schedule an appointment as soon as possible for a visit.   Specialty: Cardiothoracic Surgery Why: Our office will contact you with follow up instructions with Dr. Orvan Seen.  Contact information: [address] [place] [address] [phone]           Signed: Malon Kindle [id number] 10/02/2019, 10:44 AM

## 2019-09-23 NOTE — Progress Notes (Signed)
Progress Note  Patient Name: Brandon Lewis Date of Encounter: 09/23/2019  Primary Cardiologist: Corey Skains, MD  Subjective   Feeling better today, passing flatus, breathing improved. Has been working on ambulation. Pain controlled.  Inpatient Medications    Scheduled Meds: . aspirin  81 mg Oral Daily  . bisacodyl  10 mg Oral Daily   Or  . bisacodyl  10 mg Rectal Daily  . Chlorhexidine Gluconate Cloth  6 each Topical Daily  . clopidogrel  75 mg Oral Daily  . docusate sodium  200 mg Oral Daily  . enoxaparin (LOVENOX) injection  30 mg Subcutaneous QHS  . feeding supplement  1 Container Oral TID BM  . insulin aspart  0-24 Units Subcutaneous Q4H  . mouth rinse  15 mL Mouth Rinse BID  . metoprolol tartrate  12.5 mg Oral Q6H  . multivitamin with minerals  1 tablet Oral Daily  . pantoprazole  40 mg Oral Daily  . pneumococcal 23 valent vaccine  0.5 mL Intramuscular Once  . rosuvastatin  20 mg Oral q1800  . sodium chloride flush  3 mL Intravenous Q12H  . tamsulosin  0.4 mg Oral Daily   Continuous Infusions: . sodium chloride Stopped (09/22/19 0123)  . dextrose 5 % and 0.45 % NaCl with KCl 10 mEq/L 50 mL/hr at 09/22/19 2100  . lactated ringers    . lactated ringers Stopped (09/17/19 1156)  . lactated ringers Stopped (09/17/19 1237)   PRN Meds: albuterol, lactated ringers, ondansetron (ZOFRAN) IV, oxyCODONE, phenol, simethicone, sodium chloride flush, traMADol   Vital Signs    Vitals:   09/23/19 0600 09/23/19 0700 09/23/19 0800 09/23/19 0837  BP: (!) 156/81 126/62 139/82   Pulse: (!) 58 (!) 108 (!) 57 (!) 109  Resp: 18 14 20    Temp:  98.4 F (36.9 C)    TempSrc:  Oral    SpO2: 100% 100% 100%   Weight: 90.9 kg     Height:        Intake/Output Summary (Last 24 hours) at 09/23/2019 0858 Last data filed at 09/23/2019 0800 Gross per 24 hour  Intake 1416.73 ml  Output 650 ml  Net 766.73 ml   Last 3 Weights 09/23/2019 09/22/2019 09/21/2019  Weight (lbs) 200 lb  6.4 oz 204 lb 2.3 oz 198 lb 6.6 oz  Weight (kg) 90.9 kg 92.6 kg 90 kg      Telemetry    Sinus tach in low 100s, frequent PVCs, ventricular trigeminy this AM - Personally Reviewed  ECG    No new since 11/28 - Personally Reviewed  Physical Exam   GEN: Well nourished, well developed in no acute distress HEENT: Normal, moist mucous membranes NECK: No JVD CARDIAC: regular rhythm, normal S1 and S2, no rubs or gallops. No murmur. VASCULAR: Radial pulses 2+ bilaterally.  RESPIRATORY:  Clear to auscultation without rales, wheezing or rhonchi, improved ABDOMEN: Soft, non-tender, non-distended MUSCULOSKELETAL:  Ambulates independently SKIN: Warm and dry, trace BL LE edema NEUROLOGIC:  Alert and oriented x 3. No focal neuro deficits noted. PSYCHIATRIC:  Normal affect   Labs    High Sensitivity Troponin:   Recent Labs  Lab 09/11/19 0139 09/11/19 0334 09/11/19 0634 09/11/19 0806  TROPONINIHS 17 69* 81* 74*      Chemistry Recent Labs  Lab 09/20/19 1500  09/21/19 1714 09/22/19 0411 09/23/19 0500  NA 133*   < > 138 139 138  K 4.3   < > 3.8 4.0 4.0  CL 101   < >  108 108 107  CO2 19*   < > 20* 23 20*  GLUCOSE 144*   < > 167* 132* 152*  BUN 62*   < > 58* 53* 42*  CREATININE 2.11*   < > 1.79* 1.74* 1.44*  CALCIUM 9.0   < > 8.6* 8.6* 8.7*  PROT 6.3*  --  5.4* 5.5*  --   ALBUMIN 2.7*  --  2.3* 2.3*  --   AST 25  --  28 34  --   ALT 23  --  22 26  --   ALKPHOS 51  --  50 46  --   BILITOT 1.0  --  0.8 0.9  --   GFRNONAA 28*   < > 34* 35* 45*  GFRAA 33*   < > 40* 41* 52*  ANIONGAP 13   < > 10 8 11    < > = values in this interval not displayed.     Hematology Recent Labs  Lab 09/19/19 0319 09/20/19 0500 09/21/19 0544  WBC 13.7* 14.4* 12.1*  RBC 2.18* 2.48* 2.56*  HGB 7.6* 8.5* 8.6*  HCT 23.7* 27.3* 28.0*  MCV 108.7* 110.1* 109.4*  MCH 34.9* 34.3* 33.6  MCHC 32.1 31.1 30.7  RDW 23.5* 23.3* 22.7*  PLT 105* 126* 155    BNPNo results for input(s): BNP, PROBNP in the  last 168 hours.   DDimer No results for input(s): DDIMER in the last 168 hours.   Radiology    No results found.  Cardiac Studies   Echo limited 09/19/19  1. Left ventricular ejection fraction, by visual estimation, is 70 to 75%. The left ventricle has hyperdynamic function. There is no left ventricular hypertrophy.  2. Global right ventricle has normal systolic function.The right ventricular size is normal. No increase in right ventricular wall thickness.  3. Left atrial size was normal.  4. Right atrial size was normal.  5. Mild mitral annular calcification.  6. The mitral valve is normal in structure. No evidence of mitral valve regurgitation.  7. The tricuspid valve is normal in structure. Tricuspid valve regurgitation is not demonstrated.  8. The aortic valve is tricuspid. Aortic valve regurgitation is not visualized. Mild aortic valve sclerosis without stenosis.  9. The pulmonic valve was not well visualized. Pulmonic valve regurgitation is not visualized.  Left heart catheterization 09/12/2019:  Mid LM to Dist LM lesion is 85% stenosed.  Prox Cx lesion is 50% stenosed.  Prox LAD to Mid LAD lesion is 85% stenosed.  Prox LAD lesion is 70% stenosed.  Mid LAD lesion is 45% stenosed.  Prox RCA to Mid RCA lesion is 70% stenosed.  Patient Profile     85 y.o. male with CAD s/p prior stent who presented with NSTEMI, now s/p CABG 11/27. CV risk factors of PAD s/p aortobifemoral bypass, HTN, HLD.  Assessment & Plan    Abdominal pain/distension: improving, passing flatus, tolerating PO Atelectasis: needs to continue to incentive spirometry, but improved today  Severe multivessel CAD, now s/p 4V CABG 09/16/19 by Dr. Orvan Seen (LIMA-LAD, SVG-OM, SVG-Diag, AVG-RCA) -continue aspirin 81 mg  -given NSTEMI, would use DAPT for 12 mos. Continue clopidogrel -continue rosuvastatin 20 mg -titrating up metprolol -Wt today 90.9 kg, admission weight 89.7 kg, lowest wt this admission 87.5  kg/highest weight 95.1 kg. -incentive spirometry, mobilization as able  PVCs: uptitrating metoprolol to 12.5 mg q6 today.   Hypertension:  -was on metoprolol succinate 50 mg daily and losartan 100 mg daily prior to admission -uptitrating metoprolol first,  as above -add losartan back once metoprolol optimized  Hyperlipidemia -continue rosuvastatin, increased to 20 mg this admission  PAD: warm feet. Continue secondary prevention as above  Acute on chronic kidney disease -Cr today 1.44 today, downtrending. 1.64 on admission; peak 2.4, nadir 1.33.   Chronic anemia/thrombocytopenia: no CBC since 12/2, but no active bleeding -Hgb baseline ~10. Suspect acute blood loss anemia from surgery superimposed on anemia of chronic disease.  -no clear active blood loss -Hgb increased from 7.6 to 8.5 after 1 unit PRBC 11/30.  -platelets 155 from 105, had been ~140s  Planned transfer to the floor today.  For questions or updates, please contact [place] Please consult [email or web address] for contact info under     Signed, Buford Dresser, MD  09/23/2019, 8:58 AM

## 2019-09-23 NOTE — Progress Notes (Signed)
CARDIAC REHAB PHASE I   PRE:  Rate/Rhythm: 101 ST  BP:  Supine: 108/64  Sitting:   Standing:    SaO2: 100%RA  2 MODE:  Ambulation: 290 ft   POST:  Rate/Rhythm: 110 ST PVCs  BP:  Supine:   Sitting: 105/73  Standing:    SaO2: 99-100%RA 1114-1157 Pt was on RA as the oxygen tank he was connected to was on empty but sats were at 99-100%. Walked pt 290 ft on RA with EVA, gait belt and asst  2. Stopped once to rest. Audible wheezing but sats good on RA. To recliner and set up lunch. Left off oxygen. Would recommend PT to evaluate and assess for home needs. Pt has wife who is in wheelchair at home and son lives near by.   Graylon Good, RN BSN  09/23/2019 11:51 AM   101

## 2019-09-24 LAB — GLUCOSE, CAPILLARY
Glucose-Capillary: 120 mg/dL — ABNORMAL HIGH (ref 70–99)
Glucose-Capillary: 125 mg/dL — ABNORMAL HIGH (ref 70–99)
Glucose-Capillary: 137 mg/dL — ABNORMAL HIGH (ref 70–99)
Glucose-Capillary: 148 mg/dL — ABNORMAL HIGH (ref 70–99)
Glucose-Capillary: 164 mg/dL — ABNORMAL HIGH (ref 70–99)

## 2019-09-24 LAB — CREATININE, SERUM
Creatinine, Ser: 1.46 mg/dL — ABNORMAL HIGH (ref 0.61–1.24)
GFR calc Af Amer: 51 mL/min — ABNORMAL LOW (ref >60)
GFR calc non Af Amer: 44 mL/min — ABNORMAL LOW (ref >60)

## 2019-09-24 MED ORDER — FUROSEMIDE 10 MG/ML IJ SOLN
40.0000 mg | Freq: Once | INTRAMUSCULAR | Status: AC
  Administered 2019-09-24: 13:00:00 40 mg via INTRAVENOUS
  Filled 2019-09-24: qty 4

## 2019-09-24 MED ORDER — FUROSEMIDE 40 MG PO TABS
40.0000 mg | ORAL_TABLET | Freq: Every day | ORAL | Status: DC
  Administered 2019-09-24 – 2019-09-25 (×2): 40 mg via ORAL
  Filled 2019-09-24 (×2): qty 1

## 2019-09-24 MED ORDER — MOMETASONE FURO-FORMOTEROL FUM 100-5 MCG/ACT IN AERO
2.0000 | INHALATION_SPRAY | Freq: Two times a day (BID) | RESPIRATORY_TRACT | Status: DC
  Administered 2019-09-24 – 2019-10-02 (×16): 2 via RESPIRATORY_TRACT
  Filled 2019-09-24: qty 8.8

## 2019-09-24 MED ORDER — INSULIN ASPART 100 UNIT/ML ~~LOC~~ SOLN
0.0000 [IU] | Freq: Three times a day (TID) | SUBCUTANEOUS | Status: DC
  Administered 2019-09-25: 4 [IU] via SUBCUTANEOUS
  Administered 2019-09-25: 2 [IU] via SUBCUTANEOUS
  Administered 2019-09-25: 8 [IU] via SUBCUTANEOUS
  Administered 2019-09-25: 2 [IU] via SUBCUTANEOUS
  Administered 2019-09-26 (×2): 4 [IU] via SUBCUTANEOUS
  Administered 2019-09-26 – 2019-09-27 (×2): 8 [IU] via SUBCUTANEOUS
  Administered 2019-09-27: 2 [IU] via SUBCUTANEOUS
  Administered 2019-09-27: 21:00:00 4 [IU] via SUBCUTANEOUS
  Administered 2019-09-27: 07:00:00 2 [IU] via SUBCUTANEOUS
  Administered 2019-09-28: 22:00:00 8 [IU] via SUBCUTANEOUS
  Administered 2019-09-28 – 2019-09-29 (×4): 4 [IU] via SUBCUTANEOUS
  Administered 2019-09-30 – 2019-10-01 (×4): 2 [IU] via SUBCUTANEOUS
  Administered 2019-10-01: 21:00:00 4 [IU] via SUBCUTANEOUS

## 2019-09-24 NOTE — Progress Notes (Signed)
Pt ambulated approx 300 ft using RW, slow steady gait, one standing rest.  HR 120's.  To recliner after walk.  Encouraged flutter valve.  Will con't plan of care.

## 2019-09-24 NOTE — Evaluation (Signed)
Physical Therapy Evaluation Patient Details Name: Brandon Lewis MRN: [id number] DOB: 06/01/1934 Today's Date: 09/24/2019   History of Present Illness  85 y.o male with CAD s/p prior stent who presented with NSTEMI, now s/p CABG 11/27. CV risk factors of PAD s/p aortobifemoral bypass, HTN, HLD.  Clinical Impression  Pt presents to PT s/p CABG x4. Pt demonstrates some LE weakness s/p surgery requiring minA to perform transfers at this time. Pt requiring PT verbal cues to maintain sternal precautions at this time. Pt will benefit from continued acute PT services to reduce falls risk and aide in a return to independent mobility.    Follow Up Recommendations Home health PT;Supervision - Intermittent    Equipment Recommendations  None recommended by PT(pt owns necessary DME)    Recommendations for Other Services       Precautions / Restrictions Precautions Precautions: Fall;Sternal Restrictions Weight Bearing Restrictions: No Other Position/Activity Restrictions: sternal precautions      Mobility  Bed Mobility Overal bed mobility: (pt received up in recliner)                Transfers Overall transfer level: Needs assistance Equipment used: Rolling walker (2 wheeled) Transfers: Sit to/from Stand Sit to Stand: Min assist         General transfer comment: PT providing verbal cues to maintain sternal precautions and facilitate stand  Ambulation/Gait Ambulation/Gait assistance: Supervision Gait Distance (Feet): 100 Feet Assistive device: Rolling walker (2 wheeled) Gait Pattern/deviations: Step-through pattern Gait velocity: reduced Gait velocity interpretation: 1.31 - 2.62 ft/sec, indicative of limited community ambulator General Gait Details: steady step through gait without noticeable LOB  Stairs            Wheelchair Mobility    Modified Rankin (Stroke Patients Only)       Balance Overall balance assessment: Needs assistance Sitting-balance  support: No upper extremity supported;Feet supported Sitting balance-Leahy Scale: Good Sitting balance - Comments: modI   Standing balance support: Bilateral upper extremity supported Standing balance-Leahy Scale: Good Standing balance comment: supervision                             Pertinent Vitals/Pain Pain Assessment: Faces Faces Pain Scale: Hurts little more Pain Location: generalized abdomen Pain Descriptors / Indicators: Aching Pain Intervention(s): Limited activity within patient's tolerance    Home Living Family/patient expects to be discharged to:: Private residence Living Arrangements: Spouse/significant other Available Help at Discharge: Family;Available PRN/intermittently(children PRN, wife is disabled) Type of Home: House Home Access: Ramped entrance     Home Layout: Two level;Able to live on main level with bedroom/bathroom Home Equipment: Gilford Rile - 2 wheels;Cane - quad;Wheelchair - manual;Grab bars - toilet;Grab bars - tub/shower      Prior Function Level of Independence: Independent               Hand Dominance   Dominant Hand: Right    Extremity/Trunk Assessment   Upper Extremity Assessment Upper Extremity Assessment: Overall WFL for tasks assessed(limited 2/2 sternal precautions)    Lower Extremity Assessment Lower Extremity Assessment: Generalized weakness    Cervical / Trunk Assessment Cervical / Trunk Assessment: Normal  Communication   Communication: HOH  Cognition Arousal/Alertness: Awake/alert Behavior During Therapy: WFL for tasks assessed/performed Overall Cognitive Status: Within Functional Limits for tasks assessed  General Comments General comments (skin integrity, edema, etc.): VSS, pt with wheezing however saturating well on RA    Exercises     Assessment/Plan    PT Assessment Patient needs continued PT services  PT Problem List Decreased  strength;Decreased activity tolerance;Decreased balance;Decreased mobility;Cardiopulmonary status limiting activity       PT Treatment Interventions DME instruction;Gait training;Functional mobility training;Therapeutic activities;Therapeutic exercise;Balance training;Neuromuscular re-education;Patient/family education    PT Goals (Current goals can be found in the Care Plan section)  Acute Rehab PT Goals Patient Stated Goal: To return to independent mobility PT Goal Formulation: With patient Time For Goal Achievement: 10/08/19 Potential to Achieve Goals: Good    Frequency Min 3X/week   Barriers to discharge        Co-evaluation               AM-PAC PT "6 Clicks" Mobility  Outcome Measure Help needed turning from your back to your side while in a flat bed without using bedrails?: A Little Help needed moving from lying on your back to sitting on the side of a flat bed without using bedrails?: A Little Help needed moving to and from a bed to a chair (including a wheelchair)?: A Little Help needed standing up from a chair using your arms (e.g., wheelchair or bedside chair)?: A Little Help needed to walk in hospital room?: None Help needed climbing 3-5 steps with a railing? : A Lot 6 Click Score: 18    End of Session Equipment Utilized During Treatment: (none) Activity Tolerance: Patient tolerated treatment well Patient left: in chair;with call bell/phone within reach Nurse Communication: Mobility status PT Visit Diagnosis: Other abnormalities of gait and mobility (R26.89)    Time: 1119-1140 PT Time Calculation (min) (ACUTE ONLY): 21 min   Charges:   PT Evaluation $PT Eval Low Complexity: [place], PT, DPT Acute Rehabilitation Pager: [phone]   Zenaida Niece 09/24/2019, 12:53 PM

## 2019-09-24 NOTE — Progress Notes (Signed)
Assisted Pt to walk for 3rd time today, after cleaning up from accident in chair.  Appox 250 ft.  To bed after walk.  Will con't plan of care.

## 2019-09-24 NOTE — Progress Notes (Addendum)
[address]       [place] [address]             [phone]      8 Days Post-Op Procedure(s) (LRB): CORONARY ARTERY BYPASS GRAFTING (CABG) using LIMA to LAD; Endoscopic right saphenous vein harvest to OM1, Diag1, and RCA. (N/A) TRANSESOPHAGEAL ECHOCARDIOGRAM (TEE) (N/A) INDOCYANINE GREEN FLUORESCENCE IMAGING (ICG) (N/A) Subjective: C/o abdominal discomfort, passing flatus and having BM's + wheezing  Objective: Vital signs in last 24 hours: Temp:  [97.6 F (36.4 C)-98.8 F (37.1 C)] 98.3 F (36.8 C) (12/05 0449) Pulse Rate:  [51-109] 99 (12/05 0449) Cardiac Rhythm: Normal sinus rhythm (12/04 1940) Resp:  [18-29] 20 (12/05 0449) BP: (103-139)/(56-84) 108/56 (12/05 0449) SpO2:  [96 %-100 %] 96 % (12/05 0449) Weight:  [92.6 kg] 92.6 kg (12/05 0449)  Hemodynamic parameters for last 24 hours:    Intake/Output from previous day: 12/04 0701 - 12/05 0700 In: 1110.1 [P.O.:480; I.V.:630.1] Out: 575 [Urine:575] Intake/Output this shift: No intake/output data recorded.  General appearance: alert, cooperative and no distress Heart: regular rate and rhythm and occas extrasystoles Lungs: + exp wheeze, mostly upper airway Abdomen: mild diffuse tenderness, +BS, nondistended Extremities: + edema Wound: incis healing well  Lab Results: No results for input(s): WBC, HGB, HCT, PLT in the last 72 hours. BMET:  Recent Labs    09/22/19 0411 09/23/19 0500 09/24/19 0445  NA 139 138  --   K 4.0 4.0  --   CL 108 107  --   CO2 23 20*  --   GLUCOSE 132* 152*  --   BUN 53* 42*  --   CREATININE 1.74* 1.44* 1.46*  CALCIUM 8.6* 8.7*  --     PT/INR: No results for input(s): LABPROT, INR in the last 72 hours. ABG    Component Value Date/Time   PHART 7.313 (L) 09/16/2019 1922   HCO3 16.2 (L) 09/16/2019 1922   TCO2 17 (L) 09/16/2019 1922   ACIDBASEDEF 9.0 (H) 09/16/2019 1922   O2SAT 96.0 09/16/2019 1922   CBG (last 3)  Recent Labs    09/23/19 2048  09/24/19 0036 09/24/19 0450  GLUCAP 118* 148* 120*    Meds Scheduled Meds: . aspirin  81 mg Oral Daily  . bisacodyl  10 mg Oral Daily   Or  . bisacodyl  10 mg Rectal Daily  . Chlorhexidine Gluconate Cloth  6 each Topical Daily  . clopidogrel  75 mg Oral Daily  . docusate sodium  200 mg Oral Daily  . enoxaparin (LOVENOX) injection  30 mg Subcutaneous QHS  . feeding supplement  1 Container Oral TID BM  . insulin aspart  0-24 Units Subcutaneous Q4H  . mouth rinse  15 mL Mouth Rinse BID  . metoprolol tartrate  12.5 mg Oral Q6H  . multivitamin with minerals  1 tablet Oral Daily  . pantoprazole  40 mg Oral Daily  . pneumococcal 23 valent vaccine  0.5 mL Intramuscular Once  . rosuvastatin  20 mg Oral q1800  . tamsulosin  0.4 mg Oral Daily   Continuous Infusions: PRN Meds:.albuterol, ondansetron (ZOFRAN) IV, traMADol  Xrays Dg Chest 2 View  Result Date: 09/23/2019 CLINICAL DATA:  Bypass surgery. EXAM: CHEST - 2 VIEW COMPARISON:  Chest x-ray 09/21/2019 FINDINGS: The right PICC line is stable.  The NG tube has been removed. The heart is mildly enlarged. Stable mild tortuosity and calcification of the thoracic aorta. Improved lung aeration with better lung volumes. No  edema or effusions. IMPRESSION: Improved lung aeration.  No edema or effusions. Electronically Signed   By: Marijo Sanes M.D.   On: 09/23/2019 10:23    Assessment/Plan: S/P Procedure(s) (LRB): CORONARY ARTERY BYPASS GRAFTING (CABG) using LIMA to LAD; Endoscopic right saphenous vein harvest to OM1, Diag1, and RCA. (N/A) TRANSESOPHAGEAL ECHOCARDIOGRAM (TEE) (N/A) INDOCYANINE GREEN FLUORESCENCE IMAGING (ICG) (N/A)  1 hemodyn stable, sinus with PVC's , cardiology uptitrating betablocker- will need to be careful with wheezing present. BP control is pretty good, holding on ARB/ACE-I for now with renal issues, creat is stable at 1.46. Not sure of weight accuracy- may benefit from some diuresis. Will add 40 po lasix for now.   Will recheck labs in am 2 sats good on RA with + wheeze, flutter valve added, will change to combo bronchodilator/steroid combo,push pulm toilet/rehab   LOS: 12 days    Brandon [facility] 09/24/2019 Pager [phone]- not for patient use

## 2019-09-24 NOTE — Progress Notes (Signed)
Vitals taken after walk.

## 2019-09-24 NOTE — Progress Notes (Signed)
CARDIAC REHAB PHASE I   PRE:  Rate/Rhythm:     BP: sitting     SaO2:   MODE:  Ambulation:  No ambulation secondary to PT eval 1 hour ago and patient ambulated 100 feet  POST:  Rate/Rhythm:     BP: sitting      SaO2:   1300-1315 Patient sitting up in chair upon arrival. Extremely HOH. Ambulation with PT at 1140. Patient declines additional ambulation at this time. Discharge education initiated. Located [facility] booklet and encouraged patient to read. Provided patient with nutrition handouts, and video handout. Discussed phase 2 CR at [facility] and provided patient with brochure. Patient agreeable to referral which has been placed. Will follow up with patient on Monday. Encouraged ambulation as tolerated with nursing staff later today.  Sacora Hawbaker Minus Breeding RN, BSN

## 2019-09-25 LAB — BASIC METABOLIC PANEL
Anion gap: 10 (ref 5–15)
BUN: 39 mg/dL — ABNORMAL HIGH (ref 8–23)
CO2: 23 mmol/L (ref 22–32)
Calcium: 9 mg/dL (ref 8.9–10.3)
Chloride: 107 mmol/L (ref 98–111)
Creatinine, Ser: 1.38 mg/dL — ABNORMAL HIGH (ref 0.61–1.24)
GFR calc Af Amer: 54 mL/min — ABNORMAL LOW (ref >60)
GFR calc non Af Amer: 47 mL/min — ABNORMAL LOW (ref >60)
Glucose, Bld: 156 mg/dL — ABNORMAL HIGH (ref 70–99)
Potassium: 3.7 mmol/L (ref 3.5–5.1)
Sodium: 140 mmol/L (ref 135–145)

## 2019-09-25 LAB — CBC
HCT: 26.5 % — ABNORMAL LOW (ref 39.0–52.0)
Hemoglobin: 8.3 g/dL — ABNORMAL LOW (ref 13.0–17.0)
MCH: 34.3 pg — ABNORMAL HIGH (ref 26.0–34.0)
MCHC: 31.3 g/dL (ref 30.0–36.0)
MCV: 109.5 fL — ABNORMAL HIGH (ref 80.0–100.0)
Platelets: 184 10*3/uL (ref 150–400)
RBC: 2.42 MIL/uL — ABNORMAL LOW (ref 4.22–5.81)
RDW: 22.3 % — ABNORMAL HIGH (ref 11.5–15.5)
WBC: 14.2 10*3/uL — ABNORMAL HIGH (ref 4.0–10.5)
nRBC: 0.2 % (ref 0.0–0.2)

## 2019-09-25 LAB — MAGNESIUM: Magnesium: 1.8 mg/dL (ref 1.7–2.4)

## 2019-09-25 LAB — GLUCOSE, CAPILLARY
Glucose-Capillary: 130 mg/dL — ABNORMAL HIGH (ref 70–99)
Glucose-Capillary: 143 mg/dL — ABNORMAL HIGH (ref 70–99)
Glucose-Capillary: 206 mg/dL — ABNORMAL HIGH (ref 70–99)
Glucose-Capillary: 151 mg/dL — ABNORMAL HIGH (ref 70–99)

## 2019-09-25 MED ORDER — METOPROLOL TARTRATE 25 MG PO TABS
25.0000 mg | ORAL_TABLET | Freq: Two times a day (BID) | ORAL | Status: DC
  Administered 2019-09-25 – 2019-09-26 (×3): 25 mg via ORAL
  Filled 2019-09-25 (×3): qty 1

## 2019-09-25 MED ORDER — FE FUMARATE-B12-VIT C-FA-IFC PO CAPS
1.0000 | ORAL_CAPSULE | Freq: Two times a day (BID) | ORAL | Status: DC
  Administered 2019-09-25 – 2019-10-02 (×15): 1 via ORAL
  Filled 2019-09-25 (×14): qty 1

## 2019-09-25 MED ORDER — POTASSIUM CHLORIDE CRYS ER 20 MEQ PO TBCR
20.0000 meq | EXTENDED_RELEASE_TABLET | Freq: Every day | ORAL | Status: DC
  Administered 2019-09-25 – 2019-10-02 (×8): 20 meq via ORAL
  Filled 2019-09-25 (×8): qty 1

## 2019-09-25 NOTE — Progress Notes (Signed)
[address]       [place] [address]             [phone]      9 Days Post-Op Procedure(s) (LRB): CORONARY ARTERY BYPASS GRAFTING (CABG) using LIMA to LAD; Endoscopic right saphenous vein harvest to OM1, Diag1, and RCA. (N/A) TRANSESOPHAGEAL ECHOCARDIOGRAM (TEE) (N/A) INDOCYANINE GREEN FLUORESCENCE IMAGING (ICG) (N/A) Subjective: Slowly feeling stronger Breathing is more comfortable, not wheezing  Objective: Vital signs in last 24 hours: Temp:  [97.6 F (36.4 C)-98.4 F (36.9 C)] 98.4 F (36.9 C) (12/06 0804) Pulse Rate:  [97-109] 105 (12/06 0804) Cardiac Rhythm: Sinus tachycardia (12/06 0725) Resp:  [20-28] 20 (12/06 0804) BP: (122-150)/(65-79) 142/79 (12/06 0804) SpO2:  [94 %-100 %] 96 % (12/06 0843) Weight:  [91.7 kg] 91.7 kg (12/06 0406)  Hemodynamic parameters for last 24 hours:    Intake/Output from previous day: 12/05 0701 - 12/06 0700 In: -  Out: 425 [Urine:425] Intake/Output this shift: Total I/O In: 200 [P.O.:200] Out: -   General appearance: alert, cooperative and no distress Heart: regular rate and rhythm Lungs: no wheeze currently, mildly dim in lower fields Abdomen: mild distension, + BS, nontender Extremities: + BLE edema Wound: incis healing well  Lab Results: Recent Labs    09/25/19 0417  WBC 14.2*  HGB 8.3*  HCT 26.5*  PLT 184   BMET:  Recent Labs    09/23/19 0500 09/24/19 0445 09/25/19 0417  NA 138  --  140  K 4.0  --  3.7  CL 107  --  107  CO2 20*  --  23  GLUCOSE 152*  --  156*  BUN 42*  --  39*  CREATININE 1.44* 1.46* 1.38*  CALCIUM 8.7*  --  9.0    PT/INR: No results for input(s): LABPROT, INR in the last 72 hours. ABG    Component Value Date/Time   PHART 7.313 (L) 09/16/2019 1922   HCO3 16.2 (L) 09/16/2019 1922   TCO2 17 (L) 09/16/2019 1922   ACIDBASEDEF 9.0 (H) 09/16/2019 1922   O2SAT 96.0 09/16/2019 1922   CBG (last 3)  Recent Labs    09/24/19 1704 09/24/19 2105 09/25/19 0630   GLUCAP 164* 125* 130*    Meds Scheduled Meds: . aspirin  81 mg Oral Daily  . bisacodyl  10 mg Oral Daily   Or  . bisacodyl  10 mg Rectal Daily  . Chlorhexidine Gluconate Cloth  6 each Topical Daily  . clopidogrel  75 mg Oral Daily  . docusate sodium  200 mg Oral Daily  . enoxaparin (LOVENOX) injection  30 mg Subcutaneous QHS  . feeding supplement  1 Container Oral TID BM  . furosemide  40 mg Oral Daily  . insulin aspart  0-24 Units Subcutaneous TID AC & HS  . mouth rinse  15 mL Mouth Rinse BID  . metoprolol tartrate  12.5 mg Oral Q6H  . mometasone-formoterol  2 puff Inhalation BID  . multivitamin with minerals  1 tablet Oral Daily  . pantoprazole  40 mg Oral Daily  . pneumococcal 23 valent vaccine  0.5 mL Intramuscular Once  . rosuvastatin  20 mg Oral q1800  . tamsulosin  0.4 mg Oral Daily   Continuous Infusions: PRN Meds:.albuterol, ondansetron (ZOFRAN) IV, traMADol  Xrays Dg Chest 2 View  Result Date: 09/23/2019 CLINICAL DATA:  Bypass surgery. EXAM: CHEST - 2 VIEW COMPARISON:  Chest x-ray 09/21/2019 FINDINGS: The right PICC line is stable.  The NG tube has been removed. The heart is mildly enlarged. Stable mild tortuosity and calcification of the thoracic aorta. Improved lung aeration with better lung volumes. No edema or effusions. IMPRESSION: Improved lung aeration.  No edema or effusions. Electronically Signed   By: Marijo Sanes M.D.   On: 09/23/2019 10:23    Assessment/Plan: S/P Procedure(s) (LRB): CORONARY ARTERY BYPASS GRAFTING (CABG) using LIMA to LAD; Endoscopic right saphenous vein harvest to OM1, Diag1, and RCA. (N/A) TRANSESOPHAGEAL ECHOCARDIOGRAM (TEE) (N/A) INDOCYANINE GREEN FLUORESCENCE IMAGING (ICG) (N/A)  1 hemodyn stable in sinus rhythm/sinus tachy, + PVC's- will change metoprolol to 25 BID. BP control is pretty good. He remains volume overloaded. Renal fxn improving trend- cont current lasix, add a little K+ 2 sats good on RA- cont current inhaler 3  cont PT- he is very concerned about discharge d/t weakness and deconditioning- CIR consult placed 4 H/H pretty stable, MCV elevated, will add trinsicon 5 slight increase in leukocytosis- no fevers  LOS: 13 days    John Giovanni PA-C 09/25/2019 Pager [phone]- not for patient use

## 2019-09-25 NOTE — Evaluation (Signed)
Occupational Therapy Evaluation Patient Details Name: Brandon Lewis MRN: [id number] DOB: 08/04/1934 Today's Date: 09/25/2019    History of Present Illness 85 y.o male with CAD s/p prior stent who presented with NSTEMI, now s/p CABG 11/27. CV risk factors of PAD s/p aortobifemoral bypass, HTN, HLD.   Clinical Impression   Pt PTA: living at home with spouse; pt reports independence with ADL and mobility. Pt currently limited by sternal precautions, SOB and limited ability to perform ADL due to poor activity tolerance.Pt currently performing ADL functional mobility with minA overall; transfers with modA for power up and to avoid plopping. Pt set-upA for grooming at sink in sitting- pt tolerating standing x2  mins at sink prior to seated rest breaks. Pt minA for UB ADL and maxA for LB ADL. Pt wheezing throughout, but O2 levels >93% on RA. Pt would benefit from continued OT skilled services for energy conservation and LB dressing. OT following.    Follow Up Recommendations  Home health OT;Supervision/Assistance - 24 hour(initially)    Equipment Recommendations  3 in 1 bedside commode    Recommendations for Other Services       Precautions / Restrictions Precautions Precautions: Fall;Sternal Restrictions Weight Bearing Restrictions: Yes(sternal precautions) Other Position/Activity Restrictions: sternal precautions      Mobility Bed Mobility               General bed mobility comments: seated in recliner  Transfers Overall transfer level: Needs assistance Equipment used: Rolling walker (2 wheeled) Transfers: Sit to/from Stand Sit to Stand: Mod assist         General transfer comment: Pt requiring momentum and modA overall due to weakness    Balance Overall balance assessment: Needs assistance Sitting-balance support: No upper extremity supported;Feet supported Sitting balance-Leahy Scale: Good     Standing balance support: Bilateral upper extremity  supported Standing balance-Leahy Scale: Fair Standing balance comment: supervision                           ADL either performed or assessed with clinical judgement   ADL Overall ADL's : Needs assistance/impaired Eating/Feeding: Set up;Sitting   Grooming: Set up;Sitting   Upper Body Bathing: Minimal assistance;Sitting   Lower Body Bathing: Maximal assistance;Sitting/lateral leans;Sit to/from stand;Cueing for safety   Upper Body Dressing : Minimal assistance;Sitting   Lower Body Dressing: Maximal assistance;Sitting/lateral leans;Sit to/from stand;Cueing for safety   Toilet Transfer: Minimal assistance;Comfort height toilet;Grab bars;RW Toilet Transfer Details (indicate cue type and reason): minA to avoid plopping Toileting- Clothing Manipulation and Hygiene: Moderate assistance;Cueing for safety;Cueing for sequencing;Sitting/lateral lean;Sit to/from stand       Functional mobility during ADLs: Min guard;Cueing for safety;Rolling walker General ADL Comments: Pt limited by sternal precautions, SOB and limited ability to perform ADL due to poor activity tolerance.     Vision Baseline Vision/History: No visual deficits Vision Assessment?: No apparent visual deficits     Perception     Praxis      Pertinent Vitals/Pain Pain Assessment: Faces Faces Pain Scale: Hurts little more Pain Location: generalized abdomen Pain Descriptors / Indicators: Aching Pain Intervention(s): Limited activity within patient's tolerance     Hand Dominance Right   Extremity/Trunk Assessment Upper Extremity Assessment Upper Extremity Assessment: Overall WFL for tasks assessed(limited due to sternal precautions)       Cervical / Trunk Assessment Cervical / Trunk Assessment: Normal   Communication Communication Communication: HOH   Cognition Arousal/Alertness: Awake/alert Behavior During Therapy: WFL for  tasks assessed/performed Overall Cognitive Status: Within Functional  Limits for tasks assessed                                     General Comments  VSS, SPO2 >93% on RA    Exercises     Shoulder Instructions      Home Living Family/patient expects to be discharged to:: Private residence Living Arrangements: Spouse/significant other Available Help at Discharge: Family;Available PRN/intermittently Type of Home: House Home Access: Ramped entrance     Home Layout: Two level;Able to live on main level with bedroom/bathroom     Bathroom Shower/Tub: Occupational psychologist: Handicapped height Bathroom Accessibility: Yes   Home Equipment: Environmental consultant - 2 wheels;Cane - quad;Wheelchair - manual;Grab bars - toilet;Grab bars - tub/shower          Prior Functioning/Environment Level of Independence: Independent                 OT Problem List: Decreased strength;Decreased activity tolerance;Impaired balance (sitting and/or standing);Decreased safety awareness;Pain      OT Treatment/Interventions: Self-care/ADL training;Therapeutic exercise;Energy conservation;DME and/or AE instruction;Therapeutic activities;Visual/perceptual remediation/compensation;Patient/family education;Balance training    OT Goals(Current goals can be found in the care plan section) Acute Rehab OT Goals Patient Stated Goal: To return to independent ability OT Goal Formulation: With patient Time For Goal Achievement: 10/09/19 Potential to Achieve Goals: Good ADL Goals Pt Will Perform Grooming: with supervision;sitting;standing Pt Will Perform Lower Body Dressing: with mod assist;sitting/lateral leans;sit to/from stand;with adaptive equipment Pt Will Transfer to Toilet: with supervision;ambulating;bedside commode Pt Will Perform Toileting - Clothing Manipulation and hygiene: with mod assist;sitting/lateral leans;sit to/from stand Additional ADL Goal #1: Pt will state all sternal precautions with no verbal cues to perform mobility and ADL  OT  Frequency: Min 2X/week   Barriers to D/C:            Co-evaluation              AM-PAC OT "6 Clicks" Daily Activity     Outcome Measure Help from another person eating meals?: None Help from another person taking care of personal grooming?: A Little Help from another person toileting, which includes using toliet, bedpan, or urinal?: A Little Help from another person bathing (including washing, rinsing, drying)?: A Little Help from another person to put on and taking off regular upper body clothing?: A Lot Help from another person to put on and taking off regular lower body clothing?: A Lot 6 Click Score: 17   End of Session Equipment Utilized During Treatment: Gait belt;Rolling walker Nurse Communication: Mobility status  Activity Tolerance: Patient limited by fatigue;Patient tolerated treatment well Patient left: in chair;with call bell/phone within reach  OT Visit Diagnosis: Unsteadiness on feet (R26.81);Muscle weakness (generalized) (M62.81)                Time: [id number] OT Time Calculation (min): 28 min Charges:  OT General Charges $OT Visit: 1 Visit OT Evaluation $OT Eval Moderate Complexity: 1 Mod OT Treatments $Self Care/Home Management : 8-22 mins  Ebony Hail Harold Hedge) Marsa Aris OTR/L Acute Rehabilitation Services Pager: [phone] Office: [place] 09/25/2019, 10:09 AM

## 2019-09-25 NOTE — Progress Notes (Signed)
Pt ambulated x 250 around the unit with walker, pt become  Mildly sob but saturation maintain 93-94 % on room air.  Pt wheezing  But is only throat upper airway told pt to clear throat and wheezing ceased

## 2019-09-26 LAB — BASIC METABOLIC PANEL
Anion gap: 9 (ref 5–15)
BUN: 34 mg/dL — ABNORMAL HIGH (ref 8–23)
CO2: 24 mmol/L (ref 22–32)
Calcium: 8.8 mg/dL — ABNORMAL LOW (ref 8.9–10.3)
Chloride: 108 mmol/L (ref 98–111)
Creatinine, Ser: 1.5 mg/dL — ABNORMAL HIGH (ref 0.61–1.24)
GFR calc Af Amer: 49 mL/min — ABNORMAL LOW (ref >60)
GFR calc non Af Amer: 42 mL/min — ABNORMAL LOW (ref >60)
Glucose, Bld: 127 mg/dL — ABNORMAL HIGH (ref 70–99)
Potassium: 4 mmol/L (ref 3.5–5.1)
Sodium: 141 mmol/L (ref 135–145)

## 2019-09-26 LAB — GLUCOSE, CAPILLARY
Glucose-Capillary: 187 mg/dL — ABNORMAL HIGH (ref 70–99)
Glucose-Capillary: 119 mg/dL — ABNORMAL HIGH (ref 70–99)
Glucose-Capillary: 237 mg/dL — ABNORMAL HIGH (ref 70–99)
Glucose-Capillary: 195 mg/dL — ABNORMAL HIGH (ref 70–99)

## 2019-09-26 MED ORDER — METOPROLOL TARTRATE 5 MG/5ML IV SOLN
5.0000 mg | INTRAVENOUS | Status: AC
  Administered 2019-09-26: 14:00:00 5 mg via INTRAVENOUS

## 2019-09-26 MED ORDER — HYDROCORTISONE ACETATE 25 MG RE SUPP
25.0000 mg | Freq: Two times a day (BID) | RECTAL | Status: DC
  Administered 2019-09-26 – 2019-10-02 (×11): 25 mg via RECTAL
  Filled 2019-09-26 (×13): qty 1

## 2019-09-26 MED ORDER — WITCH HAZEL-GLYCERIN EX PADS
MEDICATED_PAD | CUTANEOUS | Status: DC | PRN
  Administered 2019-09-27: 14:00:00 via TOPICAL
  Filled 2019-09-26: qty 100

## 2019-09-26 MED ORDER — AMIODARONE IV BOLUS ONLY 150 MG/100ML
150.0000 mg | Freq: Once | INTRAVENOUS | Status: AC
  Administered 2019-09-26: 14:00:00 150 mg via INTRAVENOUS
  Filled 2019-09-26: qty 100

## 2019-09-26 MED ORDER — METOPROLOL TARTRATE 5 MG/5ML IV SOLN: 5.0000 mg | Freq: Four times a day (QID) | INTRAVENOUS | Status: DC | PRN

## 2019-09-26 MED ORDER — MAGNESIUM OXIDE 400 (241.3 MG) MG PO TABS
400.0000 mg | ORAL_TABLET | Freq: Two times a day (BID) | ORAL | Status: AC
  Administered 2019-09-26 – 2019-09-27 (×4): 400 mg via ORAL
  Filled 2019-09-26 (×4): qty 1

## 2019-09-26 MED ORDER — AMIODARONE HCL IN DEXTROSE 360-4.14 MG/200ML-% IV SOLN
30.0000 mg/h | INTRAVENOUS | Status: DC
  Administered 2019-09-27: 30 mg/h via INTRAVENOUS
  Filled 2019-09-26: qty 200

## 2019-09-26 MED ORDER — AMIODARONE HCL IN DEXTROSE 360-4.14 MG/200ML-% IV SOLN
60.0000 mg/h | INTRAVENOUS | Status: DC
  Administered 2019-09-26 (×2): 60 mg/h via INTRAVENOUS
  Filled 2019-09-26 (×2): qty 200

## 2019-09-26 MED ORDER — FUROSEMIDE 10 MG/ML IJ SOLN
40.0000 mg | Freq: Once | INTRAMUSCULAR | Status: AC
  Administered 2019-09-26: 40 mg via INTRAVENOUS
  Filled 2019-09-26: qty 4

## 2019-09-26 MED ORDER — METOPROLOL TARTRATE 5 MG/5ML IV SOLN
INTRAVENOUS | Status: AC
  Filled 2019-09-26: qty 5

## 2019-09-26 NOTE — Progress Notes (Signed)
EPW pulled per protocol and as ordered. All ends intact. PT bp 117/73 heart rate 95. Patient tolerated well. Patient reminded to lie supine approximately one hour. Will continue to monitor patient. Aarvi Stotts, Bettina Gavia RN

## 2019-09-26 NOTE — Progress Notes (Signed)
Physical medicine rehab consult requested chart reviewed.  Physical therapy evaluation completed 09/24/2019 recommendations are for home health therapies with patient requiring supervision ambulation rolling walker as well as Occupational Therapy evaluation recommendations for home health therapies.  Patient will not require inpatient rehab services.  Recommendations are discharged home

## 2019-09-26 NOTE — Progress Notes (Signed)
Patient up to chair for lunch will monitor patient. Hydee Fleece, Bettina Gavia RN

## 2019-09-26 NOTE — TOC Progression Note (Signed)
Transition of Care [facility]) - Progression Note    Patient Details  Name: Brandon Lewis MRN: [id number] Date of Birth: February 16, 1934  [facility] CM/SW Contact  Rae Mar, RN Phone Number: 09/26/2019, 2:36 PM  Clinical Narrative:     CM spoke with pt at bedside to discuss [facility] options and DME.  Pt requests a [place] agency that is in network and would like a walker and a 3-n-1.   CM spoke with Zack with [facility] who will deliver the walker and 3-in-1 to pt's room prior to D/C home.  CM spoke with Georgina Snell with [facility] who could not accept patient due to ins.  CM spoke with Cassie with [facility] who accepted pt for services and will follow with a possible transition home of Wed/Thurs.  Information placed on AVS.  TOC team will be available for further needs.  Expected Discharge Plan: [place] Barriers to Discharge: Continued Medical Work up  Expected Discharge Plan and Services Expected Discharge Plan: [place]   Discharge Planning Services: CM Consult Post Acute Care Choice: [place] arrangements for the past 2 months: Single Family Home                 DME Arranged: 3-N-1, Walker rolling DME Agency: [facility] Date DME Agency Contacted: 09/26/19 Time DME Agency Contacted: 10 Representative spoke with at DME Agency: [place]: RN, PT, OT [place] Date [place]: 09/26/19 Time [place]: 1435 Representative spoke with at [place]: Cassie   Social Determinants of Health ([place]) Interventions    Readmission Risk Interventions No flowsheet data found.

## 2019-09-26 NOTE — Progress Notes (Signed)
Patient with heart rates in 160-171 on monitor, patient was getting ready to work with PT had just used urinal, patient BP 126/72. Patient states he "just feels weak" assisted with PT patient back to bed. EKG obtained and Murvin [facility] made aware and up to see patient. Orders received. Will monitor patient. Bethzaida Boord, Bettina Gavia rN

## 2019-09-26 NOTE — Progress Notes (Signed)
      [address]       [place] [address]             [phone]      10 Days Post-Op Procedure(s) (LRB): CORONARY ARTERY BYPASS GRAFTING (CABG) using LIMA to LAD; Endoscopic right saphenous vein harvest to OM1, Diag1, and RCA. (N/A) TRANSESOPHAGEAL ECHOCARDIOGRAM (TEE) (N/A) INDOCYANINE GREEN FLUORESCENCE IMAGING (ICG) (N/A)   Subjective:  Patient up in chair eating breakfast.  Has no specific complaints.  He is unable to go to CIR, so he wishes to go home with PT/OT, RN care.  + BM  Objective: Vital signs in last 24 hours: Temp:  [97.7 F (36.5 C)-98.4 F (36.9 C)] 98.1 F (36.7 C) (12/07 0743) Pulse Rate:  [96-110] 100 (12/07 0743) Cardiac Rhythm: Supraventricular tachycardia (12/07 0457) Resp:  [16-20] 16 (12/07 0743) BP: (118-158)/(66-78) 118/71 (12/07 0743) SpO2:  [94 %-100 %] 97 % (12/07 0743) Weight:  [91.4 kg] 91.4 kg (12/07 0518)  Intake/Output from previous day: 12/06 0701 - 12/07 0700 In: 200 [P.O.:200] Out: 1350 [Urine:1350]  General appearance: alert, cooperative and no distress Heart: regular rate and rhythm Lungs: clear to auscultation bilaterally Abdomen: soft, non-tender; bowel sounds normal; no masses,  no organomegaly Extremities: edema 1+ pitting Wound: clean and dry  Lab Results: Recent Labs    09/25/19 0417  WBC 14.2*  HGB 8.3*  HCT 26.5*  PLT 184   BMET:  Recent Labs    09/25/19 0417 09/26/19 0437  NA 140 141  K 3.7 4.0  CL 107 108  CO2 23 24  GLUCOSE 156* 127*  BUN 39* 34*  CREATININE 1.38* 1.50*  CALCIUM 9.0 8.8*    PT/INR: No results for input(s): LABPROT, INR in the last 72 hours. ABG    Component Value Date/Time   PHART 7.313 (L) 09/16/2019 1922   HCO3 16.2 (L) 09/16/2019 1922   TCO2 17 (L) 09/16/2019 1922   ACIDBASEDEF 9.0 (H) 09/16/2019 1922   O2SAT 96.0 09/16/2019 1922   CBG (last 3)  Recent Labs    09/25/19 1601 09/25/19 2142 09/26/19 0625  GLUCAP 206* 151* 187*    Assessment/Plan:  S/P Procedure(s) (LRB): CORONARY ARTERY BYPASS GRAFTING (CABG) using LIMA to LAD; Endoscopic right saphenous vein harvest to OM1, Diag1, and RCA. (N/A) TRANSESOPHAGEAL ECHOCARDIOGRAM (TEE) (N/A) INDOCYANINE GREEN FLUORESCENCE IMAGING (ICG) (N/A)  1. CV- Sinus Tach, + PVCs- on Lopressor 25 mg BID, will d/c EPW today 2. Pulm- no acute issues, continue IS 3. Renal- creatinine stable at 1.5 (baseline 1.4)- will give IV Lasix 4. Expected post operative blood loss anemia, mild Hgb at 8.3 5. ID- afebrile, mild leukocytosis, no evidence of infection 6. Deconditioning- patient isn't a candidate for CIR, refuses SNF stay, have made arrangements for home PT/OT,  7. Dispo- patient stable, will repeat labs in AM to ensure no further rise in creatinine, diurese, d/c EPW.. possibly ready for d/c maybe W/Thurs   LOS: 14 days    Ellwood Handler, PA-C 09/26/2019

## 2019-09-26 NOTE — Progress Notes (Signed)
Erin Barrett PAC made aware of patient bleeding hemorrhoids. Will monitor patient. Jonisha Kindig, Bettina Gavia RN

## 2019-09-26 NOTE — Progress Notes (Signed)
Contacted by nursing for elevated HR.  EKG obtained showed Atrial Flutter with rates in the 160s  Gen: no apparent distress Heart: RRR  Plan:  1. IV Lopressor 5 mg once 2. Amiodarone bolus and drip 3. Repeat labs in AM  Brandon Westrup, PA-C

## 2019-09-26 NOTE — Progress Notes (Addendum)
CARDIAC REHAB PHASE I   PRE:  Rate/Rhythm: 107 SR  BP:  Supine:   Sitting: 138/77  Standing:    SaO2: 98-100%  MODE:  Ambulation: from bathroom ft   POST:  Rate/Rhythm: 163 ? afib some p waves visible, back down to 110 with rest [phone] Assisted pt from bathroom and helped him get cleaned up. HR elevated and pt SOB. Assisted to recliner and HR back down. BP and sats stable. Notified PA and RN of episode of elevated HR. Will return for ambulation after pt has rested.  Pt also has bleeding from hemorrhoids.  CARDIAC REHAB PHASE I   PRE:  Rate/Rhythm: 104 ST  BP:  Supine:   Sitting: 131/70  Standing:    SaO2: 98%RA  MODE:  Ambulation: 290 ft   POST:  Rate/Rhythm: 124 brief ST with PVC   105 sitting                  To bathroom   SaO2: 98%RA [phone] Returned to walk with pt.  Pt walked 290 ft on RA with rolling walker and asst x 2. Watched HR whole walk. Did not spike as earlier. Pt stopped once to rest. Some DOE noted. Pt had received lasix prior to walking and needed to go back quickly. To bathroom and notified NT and RN. Pt will use call light.    Graylon Good, RN BSN  09/26/2019 9:48 AM         Graylon Good, RN BSN  09/26/2019 9:00 AM

## 2019-09-26 NOTE — Progress Notes (Signed)
   Noted possible plans for d/c home tomorrow pending labs (as creatinine increased) with home PT.   CARDIOLOGY RECOMMENDATIONS:  Discharge is anticipated in the next 48 hours. Recommendations for medications and follow up:  Discharge Medications: Continue medications as they are currently listed in the [facility]. Exceptions to the above:  Not on ARB due to rising creatinine  Follow Up: The patient's Primary Cardiologist is Corey Skains, MD  Follow up in the office in 2 week(s).  Signed,  Pixie Casino, MD  8:45 AM 09/26/2019  [facility]

## 2019-09-26 NOTE — Progress Notes (Addendum)
Patient converted to NSR rate of 98 at 315pm. bp 120/67 will monitor patient. Rox Mcgriff, Bettina Gavia RN

## 2019-09-26 NOTE — Progress Notes (Signed)
Physical Therapy Treatment Patient Details Name: Brandon Lewis MRN: [id number] DOB: 10-17-1934 Today's Date: 09/26/2019    History of Present Illness 85 y.o male with CAD s/p prior stent who presented with NSTEMI, now s/p CABG 11/27. CV risk factors of PAD s/p aortobifemoral bypass, HTN, HLD.    PT Comments    Patient not progressing towards PT goals today. Upon standing, pt with elevated HR up to 174 bpm. Sat EOB for 5 minutes and HR sustained in 160-170s. RN called to room. Found to be in A flutter. Pt asymptomatic except reports of feeling weak. Deferred ambulation this afternoon. Requires Mod A to stand while adhering to sternal precautions. Will continue to follow.    Follow Up Recommendations  Home health PT;Supervision - Intermittent     Equipment Recommendations  None recommended by PT    Recommendations for Other Services       Precautions / Restrictions Precautions Precautions: Fall;Sternal Restrictions Weight Bearing Restrictions: Yes Other Position/Activity Restrictions: sternal precautions    Mobility  Bed Mobility               General bed mobility comments: seated in recliner  Transfers Overall transfer level: Needs assistance Equipment used: Rolling walker (2 wheeled) Transfers: Sit to/from [employer] Transfers Sit to Stand: Mod assist Stand pivot transfers: Min guard       General transfer comment: Assist to power to standing with use of momentum holding heart pillow. Transferred to bed after urinating due to elevated HR up to 174 bpm. Rn notified.  Ambulation/Gait             General Gait Details: Deferred due to sustained tachycardia into 160-170s- RN called, notified to be in A flutter   Stairs             Wheelchair Mobility    Modified Rankin (Stroke Patients Only)       Balance Overall balance assessment: Needs assistance Sitting-balance support: Feet supported;No upper extremity supported Sitting  balance-Leahy Scale: Good     Standing balance support: During functional activity Standing balance-Leahy Scale: Fair Standing balance comment: Able to stand and urinate with 1 UE support.                            Cognition Arousal/Alertness: Awake/alert Behavior During Therapy: WFL for tasks assessed/performed Overall Cognitive Status: Within Functional Limits for tasks assessed                                        Exercises      General Comments General comments (skin integrity, edema, etc.): Noted to be coughing and bracing with heart pillow and coughing up some phelgm.      Pertinent Vitals/Pain Pain Assessment: Faces Faces Pain Scale: No hurt    Home Living                      Prior Function            PT Goals (current goals can now be found in the care plan section) Progress towards PT goals: Not progressing toward goals - comment(secondary to sustained tachycardia)    Frequency    Min 3X/week      PT Plan Current plan remains appropriate    Co-evaluation  AM-PAC PT "6 Clicks" Mobility   Outcome Measure  Help needed turning from your back to your side while in a flat bed without using bedrails?: A Little Help needed moving from lying on your back to sitting on the side of a flat bed without using bedrails?: A Little Help needed moving to and from a bed to a chair (including a wheelchair)?: A Little Help needed standing up from a chair using your arms (e.g., wheelchair or bedside chair)?: A Lot Help needed to walk in hospital room?: A Little Help needed climbing 3-5 steps with a railing? : A Lot 6 Click Score: 16    End of Session   Activity Tolerance: Treatment limited secondary to medical complications (Comment)(sustainedtachycardia) Patient left: in bed;with call bell/phone within reach;with nursing/sitter in room Nurse Communication: Mobility status;Other (comment)(HR) PT Visit  Diagnosis: Other abnormalities of gait and mobility (R26.89)     Time: [id number] PT Time Calculation (min) (ACUTE ONLY): 21 min  Charges:  $Therapeutic Activity: 8-22 mins                     [name], PT, DPT Acute Rehabilitation Services Pager [phone] Office [phone]       [name] 09/26/2019, 3:20 PM

## 2019-09-27 DIAGNOSIS — I4892 Unspecified atrial flutter: Secondary | ICD-10-CM

## 2019-09-27 DIAGNOSIS — Z951 Presence of aortocoronary bypass graft: Secondary | ICD-10-CM

## 2019-09-27 LAB — BASIC METABOLIC PANEL
Anion gap: 12 (ref 5–15)
BUN: 41 mg/dL — ABNORMAL HIGH (ref 8–23)
CO2: 22 mmol/L (ref 22–32)
Calcium: 8.5 mg/dL — ABNORMAL LOW (ref 8.9–10.3)
Chloride: 105 mmol/L (ref 98–111)
Creatinine, Ser: 1.82 mg/dL — ABNORMAL HIGH (ref 0.61–1.24)
GFR calc Af Amer: 39 mL/min — ABNORMAL LOW (ref >60)
GFR calc non Af Amer: 34 mL/min — ABNORMAL LOW (ref >60)
Glucose, Bld: 221 mg/dL — ABNORMAL HIGH (ref 70–99)
Potassium: 3.9 mmol/L (ref 3.5–5.1)
Sodium: 139 mmol/L (ref 135–145)

## 2019-09-27 LAB — CBC
HCT: 27.9 % — ABNORMAL LOW (ref 39.0–52.0)
Hemoglobin: 8.5 g/dL — ABNORMAL LOW (ref 13.0–17.0)
MCH: 33.9 pg (ref 26.0–34.0)
MCHC: 30.5 g/dL (ref 30.0–36.0)
MCV: 111.2 fL — ABNORMAL HIGH (ref 80.0–100.0)
Platelets: 213 10*3/uL (ref 150–400)
RBC: 2.51 MIL/uL — ABNORMAL LOW (ref 4.22–5.81)
RDW: 22.6 % — ABNORMAL HIGH (ref 11.5–15.5)
WBC: 19.3 10*3/uL — ABNORMAL HIGH (ref 4.0–10.5)
nRBC: 0 % (ref 0.0–0.2)

## 2019-09-27 LAB — GLUCOSE, CAPILLARY
Glucose-Capillary: 132 mg/dL — ABNORMAL HIGH (ref 70–99)
Glucose-Capillary: 224 mg/dL — ABNORMAL HIGH (ref 70–99)
Glucose-Capillary: 123 mg/dL — ABNORMAL HIGH (ref 70–99)
Glucose-Capillary: 172 mg/dL — ABNORMAL HIGH (ref 70–99)

## 2019-09-27 MED ORDER — METOPROLOL TARTRATE 25 MG PO TABS
37.5000 mg | ORAL_TABLET | Freq: Two times a day (BID) | ORAL | Status: DC
  Administered 2019-09-27 – 2019-09-28 (×3): 37.5 mg via ORAL
  Filled 2019-09-27 (×3): qty 2

## 2019-09-27 MED ORDER — AMIODARONE HCL 200 MG PO TABS
400.0000 mg | ORAL_TABLET | Freq: Every day | ORAL | Status: DC
  Administered 2019-09-27 – 2019-10-02 (×6): 400 mg via ORAL
  Filled 2019-09-27 (×6): qty 2

## 2019-09-27 MED ORDER — IPRATROPIUM-ALBUTEROL 0.5-2.5 (3) MG/3ML IN SOLN
3.0000 mL | Freq: Three times a day (TID) | RESPIRATORY_TRACT | Status: DC
  Administered 2019-09-27 – 2019-10-02 (×15): 3 mL via RESPIRATORY_TRACT
  Filled 2019-09-27 (×13): qty 3

## 2019-09-27 NOTE — Progress Notes (Addendum)
Nutrition Follow up  DOCUMENTATION CODES:   Not applicable  INTERVENTION:    ContinueBoost Breeze po TID, each supplement provides 250 kcal and 9 grams of protein  Continue MVI daily   NUTRITION DIAGNOSIS:   Increased nutrient needs related to post-op healing as evidenced by estimated needs.  Ongoing  GOAL:   Patient will meet greater than or equal to 90% of their needs   Progressing   MONITOR:   Supplement acceptance, Diet advancement, PO intake, Labs, Weight trends, I & O's, Skin  REASON FOR ASSESSMENT:   NPO/Clear Liquid Diet    ASSESSMENT:   Patient with PMH significant for CAD s/p remote PCI/DES, PVD s/p aortobifemoral bypass with fem artery occlusion, HTN, and HLD. Presents this admission with severe CAD with unstable angina.   11/27- CABG x4, extubated    Pt HOH. Reports appetite is great. Meal completions charted as 25-100% for his last eight meals. Drinking Boost Breeze 2-3 times daily and would like to continue. RD encouraged high protein intake to promote post-op healing.   Admission weight: 89.7 kg  Current weight: 91.2 kg    I/O: +2,763 ml since 11/24 UOP: 1,150 ml x 24 hrs     Medications: dulcolax, colace, ferrous fumarate-b12-vitc, SS novolog, Mag-ox, MVI, 20 mEq KCl Labs: CBG [id number]  Diet Order:   Diet Order            Diet heart healthy/carb modified Room service appropriate? Yes with Assist; Fluid consistency: Thin  Diet effective now              EDUCATION NEEDS:   Not appropriate for education at this time  Skin:  Skin Assessment: Skin Integrity Issues: Skin Integrity Issues:: Incisions Incisions: R leg, chest  Last BM:  12/7  Height:   Ht Readings from Last 1 Encounters:  09/17/19 5\' 10"  (1.778 m)    Weight:   Wt Readings from Last 1 Encounters:  09/27/19 91.2 kg    Ideal Body Weight:  75.5 kg  BMI:  Body mass index is 28.85 kg/m.  Estimated Nutritional Needs:   Kcal:  2250-2450 kcal  Protein:   115-130 grams  Fluid:  >/= 2.2 L/day   Mariana Single RD, LDN Clinical Nutrition Pager # - [phone]

## 2019-09-27 NOTE — Progress Notes (Addendum)
Progress Note  Patient Name: Brandon Lewis Date of Encounter: 09/27/2019  Primary Cardiologist: Corey Skains, MD   Subjective   Yesterday afternoon patient converted to aflutter, rates up to 170 and was started on IV amio. He converted back to NSR about 2 hours later. He felt weak. He is maintaining NSR at this time.   Inpatient Medications    Scheduled Meds: . aspirin  81 mg Oral Daily  . bisacodyl  10 mg Oral Daily   Or  . bisacodyl  10 mg Rectal Daily  . Chlorhexidine Gluconate Cloth  6 each Topical Daily  . clopidogrel  75 mg Oral Daily  . docusate sodium  200 mg Oral Daily  . enoxaparin (LOVENOX) injection  30 mg Subcutaneous QHS  . feeding supplement  1 Container Oral TID BM  . ferrous [id number] C-folic acid  1 capsule Oral BID PC  . hydrocortisone  25 mg Rectal BID  . insulin aspart  0-24 Units Subcutaneous TID AC & HS  . magnesium oxide  400 mg Oral BID  . mouth rinse  15 mL Mouth Rinse BID  . metoprolol tartrate  37.5 mg Oral BID  . mometasone-formoterol  2 puff Inhalation BID  . multivitamin with minerals  1 tablet Oral Daily  . pantoprazole  40 mg Oral Daily  . pneumococcal 23 valent vaccine  0.5 mL Intramuscular Once  . potassium chloride  20 mEq Oral Daily  . rosuvastatin  20 mg Oral q1800  . tamsulosin  0.4 mg Oral Daily   Continuous Infusions: . amiodarone 30 mg/hr (09/27/19 0006)   PRN Meds: albuterol, metoprolol tartrate, ondansetron (ZOFRAN) IV, traMADol, witch hazel-glycerin   Vital Signs    Vitals:   09/27/19 0409 09/27/19 0454 09/27/19 0749 09/27/19 0946  BP: (!) 137/58  (!) 125/54 127/66  Pulse: 87  81   Resp: 20  19 (!) 27  Temp: 98.3 F (36.8 C)  98 F (36.7 C)   TempSrc: Oral  Oral   SpO2: 98%  98%   Weight:  91.2 kg    Height:        Intake/Output Summary (Last 24 hours) at 09/27/2019 1003 Last data filed at 09/27/2019 0930 Gross per 24 hour  Intake 607.49 ml  Output 1050 ml  Net -442.51 ml   Last 3  Weights 09/27/2019 09/26/2019 09/25/2019  Weight (lbs) 201 lb 1 oz 201 lb 6.4 oz 202 lb 1.6 oz  Weight (kg) 91.2 kg 91.354 kg 91.672 kg      Telemetry    Currently NSR with PACS and PVCs, rates in the 90s. Had 2 hour episode of flutter rates up to 170; bigeminy with PACs - Personally Reviewed  ECG    Pending - Personally Reviewed  Physical Exam   GEN: No acute distress.   Neck: No JVD Cardiac: RRR, no murmurs, rubs, or gallops.  Respiratory: Clear to auscultation bilaterally. GI: Soft, nontender, non-distended  MS: No edema; No deformity. Neuro:  Nonfocal  Psych: Normal affect   Labs    High Sensitivity Troponin:   Recent Labs  Lab 09/11/19 0139 09/11/19 0334 09/11/19 0634 09/11/19 0806  TROPONINIHS 17 69* 81* 74*      Chemistry Recent Labs  Lab 09/20/19 1500  09/21/19 1714 09/22/19 0411  09/25/19 0417 09/26/19 0437 09/27/19 0445  NA 133*   < > 138 139   < > 140 141 139  K 4.3   < > 3.8 4.0   < > 3.7  4.0 3.9  CL 101   < > 108 108   < > 107 108 105  CO2 19*   < > 20* 23   < > 23 24 22   GLUCOSE 144*   < > 167* 132*   < > 156* 127* 221*  BUN 62*   < > 58* 53*   < > 39* 34* 41*  CREATININE 2.11*   < > 1.79* 1.74*   < > 1.38* 1.50* 1.82*  CALCIUM 9.0   < > 8.6* 8.6*   < > 9.0 8.8* 8.5*  PROT 6.3*  --  5.4* 5.5*  --   --   --   --   ALBUMIN 2.7*  --  2.3* 2.3*  --   --   --   --   AST 25  --  28 34  --   --   --   --   ALT 23  --  22 26  --   --   --   --   ALKPHOS 51  --  50 46  --   --   --   --   BILITOT 1.0  --  0.8 0.9  --   --   --   --   GFRNONAA 28*   < > 34* 35*   < > 47* 42* 34*  GFRAA 33*   < > 40* 41*   < > 54* 49* 39*  ANIONGAP 13   < > 10 8   < > 10 9 12    < > = values in this interval not displayed.     Hematology Recent Labs  Lab 09/21/19 0544 09/25/19 0417  WBC 12.1* 14.2*  RBC 2.56* 2.42*  HGB 8.6* 8.3*  HCT 28.0* 26.5*  MCV 109.4* 109.5*  MCH 33.6 34.3*  MCHC 30.7 31.3  RDW 22.7* 22.3*  PLT 155 184    BNPNo results for  input(s): BNP, PROBNP in the last 168 hours.   DDimer No results for input(s): DDIMER in the last 168 hours.   Radiology    No results found.  Cardiac Studies   Echo 09/19/19  1. Left ventricular ejection fraction, by visual estimation, is 70 to 75%. The left ventricle has hyperdynamic function. There is no left ventricular hypertrophy.  2. Global right ventricle has normal systolic function.The right ventricular size is normal. No increase in right ventricular wall thickness.  3. Left atrial size was normal.  4. Right atrial size was normal.  5. Mild mitral annular calcification.  6. The mitral valve is normal in structure. No evidence of mitral valve regurgitation.  7. The tricuspid valve is normal in structure. Tricuspid valve regurgitation is not demonstrated.  8. The aortic valve is tricuspid. Aortic valve regurgitation is not visualized. Mild aortic valve sclerosis without stenosis.  9. The pulmonic valve was not well visualized. Pulmonic valve regurgitation is not visualized.  Left heart catheterization 09/12/2019:  Mid LM to Dist LM lesion is 85% stenosed.  Prox Cx lesion is 50% stenosed.  Prox LAD to Mid LAD lesion is 85% stenosed.  Prox LAD lesion is 70% stenosed.  Mid LAD lesion is 45% stenosed.  Prox RCA to Mid RCA lesion is 70% stenosed.  Patient Profile     85 y.o. male with CAD s/p prior stent who presented with NSTEMI, now s/p CABG 11/27. CV risk factors of PAD s/p aortobifemoral bypass, HTN, HLD.  Assessment & Plan    CAD s/p CABG -  post op day 12 - patient was going to be discharged but went into alfutter - continue Aspirin, Plavix, statin. metoprolol increased to 37.5 mg BID   Brief episode of Aflutter - Patent was started on IV amio and converted to NSR after 2 hours. - Remains in NSR. Rates good - Continue IV amio - CHADSVASC = 5 (HTN, AGE, PVD, DM) - Do not think he will need long term anticoagulation given post-op period and brief episode. MD  to see   HLD - continue statin  HTN - continue metoprolol - No ACE/ARB for kidney function  For questions or updates, please contact [facility] Please consult [email or web address] for contact info under        Signed, Marcos Ruelas Ninfa Meeker, PA-C  09/27/2019, 10:03 AM

## 2019-09-27 NOTE — Progress Notes (Signed)
Occupational Therapy Treatment Patient Details Name: Brandon Lewis MRN: [id number] DOB: 04/07/1934 Today's Date: 09/27/2019    History of present illness 85 y.o male with CAD s/p prior stent who presented with NSTEMI, now s/p CABG 11/27. CV risk factors of PAD s/p aortobifemoral bypass, HTN, HLD.   OT comments  Pt making steady progress towards OT goals this session. Pt found to be on toilet upon arrival. Assisted pt off of regular toilet with MOD A to power into standing and cues to not pull with BUEs d/t sternal precautions. Pt required total A for posterior pericare after BM. Session focus on compensatory strategies to incorporate into ADL routine to maintain sternal precautions. Pt verbalized understanding of strategies, able to return demonstrate LB strategy as well as simulate UB dressing technique. Pt completed functional mobility from bathroom to recliner with RW and min guard assist. Pt reports his wife can't help him at home and that his family is currently working on finding assist to help pt at home.  DC plan remains appropriate, contingent on pt having initial 24 hour assist at time of DC, will continue to follow acutely per POC.    Follow Up Recommendations  Home health OT;Supervision/Assistance - 24 hour;Other (comment)(initially)    Equipment Recommendations  3 in 1 bedside commode    Recommendations for Other Services      Precautions / Restrictions Precautions Precautions: Fall;Sternal Precaution Booklet Issued: Yes (comment) Precaution Comments: reviewed precautions handout with pt verbalizng all precautions Restrictions Weight Bearing Restrictions: Yes Other Position/Activity Restrictions: sternal precautions       Mobility Bed Mobility               General bed mobility comments: seated in recliner  Transfers Overall transfer level: Needs assistance Equipment used: Rolling walker (2 wheeled) Transfers: Sit to/from Stand Sit to Stand: Mod assist          General transfer comment: MOD A to power into standing from low toilet; cues to not pull with BUE on grab bars d/t sternal prcautions    Balance Overall balance assessment: Needs assistance Sitting-balance support: Feet supported;No upper extremity supported Sitting balance-Leahy Scale: Good     Standing balance support: Bilateral upper extremity supported Standing balance-Leahy Scale: Poor Standing balance comment: reliant on BUE support                           ADL either performed or assessed with clinical judgement   ADL Overall ADL's : Needs assistance/impaired     Grooming: Wash/dry hands;Sitting;Set up Grooming Details (indicate cue type and reason): requested wash cloth in recliner to wash off hands             Lower Body Dressing: Supervision/safety;Sitting/lateral leans Lower Body Dressing Details (indicate cue type and reason): able to figure four Toilet Transfer: Ambulation;RW;Regular Toilet;Moderate assistance;Cueing for safety;Min guard Toilet Transfer Details (indicate cue type and reason): pt found to be on toilet at start of session ; MOD A for sit>stand from low regular toilet and cues to not use BUEs to pull up on grab bars. MIN G for balance to ambulate back to recliner and to manage lines Toileting- Clothing Manipulation and Hygiene: Total assistance;Sit to/from stand;Sitting/lateral lean Toileting - Clothing Manipulation Details (indicate cue type and reason): total A for posterior pericare after BM; education provided on lateral leans technique with pt verbalizing understanding     Functional mobility during ADLs: Min guard;Cueing for safety;Rolling walker General ADL  Comments: session focus on toileting tasks and compensatroy strategies to maintain sternal precautions. education provided on importance of IS     Vision Baseline Vision/History: No visual deficits Vision Assessment?: No apparent visual deficits   Perception      Praxis      Cognition Arousal/Alertness: Awake/alert Behavior During Therapy: WFL for tasks assessed/performed Overall Cognitive Status: Within Functional Limits for tasks assessed                                 General Comments: pt hard of hearing        Exercises     Shoulder Instructions       General Comments pt reports feeling like his breathing is heavy; encouraged used of IS 10x every hour with pt verbalizing understanding. rectal bleeding noted during pericare; RN aware    Pertinent Vitals/ Pain       Pain Assessment: Faces Faces Pain Scale: Hurts a little bit Pain Location: generalized abdomen Pain Descriptors / Indicators: Discomfort Pain Intervention(s): Monitored during session;Limited activity within patient's tolerance;Repositioned  Home Living                                          Prior Functioning/Environment              Frequency  Min 2X/week        Progress Toward Goals  OT Goals(current goals can now be found in the care plan section)  Progress towards OT goals: Progressing toward goals  Acute Rehab OT Goals Patient Stated Goal: To return to independent ability OT Goal Formulation: With patient Time For Goal Achievement: 10/09/19 Potential to Achieve Goals: Good  Plan Discharge plan remains appropriate    Co-evaluation                 AM-PAC OT "6 Clicks" Daily Activity     Outcome Measure   Help from another person eating meals?: None Help from another person taking care of personal grooming?: A Little Help from another person toileting, which includes using toliet, bedpan, or urinal?: A Little Help from another person bathing (including washing, rinsing, drying)?: A Little Help from another person to put on and taking off regular upper body clothing?: A Lot Help from another person to put on and taking off regular lower body clothing?: A Lot 6 Click Score: 17    End of Session  Equipment Utilized During Treatment: Rolling walker  OT Visit Diagnosis: Unsteadiness on feet (R26.81);Muscle weakness (generalized) (M62.81)   Activity Tolerance Patient tolerated treatment well   Patient Left in chair;with call bell/phone within reach   Nurse Communication Mobility status        Time: 1140-1159 OT Time Calculation (min): 19 min  Charges: OT General Charges $OT Visit: 1 Visit OT Treatments $Self Care/Home Management : 8-22 mins  [name], COTA/L Acute Rehabilitation Services [phone]    Brandon Lewis 09/27/2019, 12:26 PM

## 2019-09-27 NOTE — Progress Notes (Signed)
Offered walk this AM- pt would like to wait till after breakfast. Will continue to monitor.

## 2019-09-27 NOTE — Progress Notes (Signed)
      [address]       [name] [address]             [phone]      11 Days Post-Op Procedure(s) (LRB): CORONARY ARTERY BYPASS GRAFTING (CABG) using LIMA to LAD; Endoscopic right saphenous vein harvest to OM1, Diag1, and RCA. (N/A) TRANSESOPHAGEAL ECHOCARDIOGRAM (TEE) (N/A) INDOCYANINE GREEN FLUORESCENCE IMAGING (ICG) (N/A) Subjective: Extremely HOH. Patient understands he went into aflutter. He is going to walk three times today and use his incentive spirometer.   Objective: Vital signs in last 24 hours: Temp:  [98 F (36.7 C)-98.3 F (36.8 C)] 98.3 F (36.8 C) (12/08 0409) Pulse Rate:  [55-155] 87 (12/08 0409) Cardiac Rhythm: Sinus tachycardia;Other (Comment) (12/07 1941) Resp:  [16-31] 20 (12/08 0409) BP: (98-142)/(48-82) 137/58 (12/08 0409) SpO2:  [92 %-100 %] 98 % (12/08 0409) Weight:  [91.2 kg] 91.2 kg (12/08 0454)     Intake/Output from previous day: 12/07 0701 - 12/08 0700 In: 827.5 [P.O.:456; I.V.:371.5] Out: 1150 [Urine:1150] Intake/Output this shift: No intake/output data recorded.  General appearance: alert, cooperative and no distress Heart: irregularly irregular rhythm Lungs: clear to auscultation bilaterally and expiratory wheezing Abdomen: some tenderness Extremities: 1+ bilateral pitting edema Wound: clean and dry  Lab Results: Recent Labs    09/25/19 0417  WBC 14.2*  HGB 8.3*  HCT 26.5*  PLT 184   BMET:  Recent Labs    09/26/19 0437 09/27/19 0445  NA 141 139  K 4.0 3.9  CL 108 105  CO2 24 22  GLUCOSE 127* 221*  BUN 34* 41*  CREATININE 1.50* 1.82*  CALCIUM 8.8* 8.5*    PT/INR: No results for input(s): LABPROT, INR in the last 72 hours. ABG    Component Value Date/Time   PHART 7.313 (L) 09/16/2019 1922   HCO3 16.2 (L) 09/16/2019 1922   TCO2 17 (L) 09/16/2019 1922   ACIDBASEDEF 9.0 (H) 09/16/2019 1922   O2SAT 96.0 09/16/2019 1922   CBG (last 3)  Recent Labs    09/26/19 1617 09/26/19 2113 09/27/19  0632  GLUCAP 237* 195* 132*    Assessment/Plan: S/P Procedure(s) (LRB): CORONARY ARTERY BYPASS GRAFTING (CABG) using LIMA to LAD; Endoscopic right saphenous vein harvest to OM1, Diag1, and RCA. (N/A) TRANSESOPHAGEAL ECHOCARDIOGRAM (TEE) (N/A) INDOCYANINE GREEN FLUORESCENCE IMAGING (ICG) (N/A)  1. CV-Aflutter last afternoon. Now on Amio IV. Irregular in the 80s this morning. Increase metoprolol 2. Pulm-tolerating room air with good oxygen saturation.  3. Renal-ordered a CBC to evaluate the creatinine.  4. H and H stable 5. Blood glucose has been stable.   Plan: Breathing treatment this morning for wheezing. Await CBC to evaluate renal function. Continue Amio for irregular rhythm. Ambulate in the halls today   LOS: 15 days    Brandon Lewis 09/27/2019

## 2019-09-27 NOTE — Progress Notes (Signed)
CARDIAC REHAB PHASE I   PRE:  Rate/Rhythm: 77 SR with PACs    BP: sitting 116/73    SaO2: 97 RA  MODE:  Ambulation: 280 ft   POST:  Rate/Rhythm: 100 ST    BP: sitting 143/67     SaO2: 96 RA  Pt able to stand with coaching on moving to edge of recliner and rocking. Steady walking with RW although increased SOB and fatigue with distance. VSS. Return to recliner for lunch.   [id number]  [place], ACSM 09/27/2019 12:53 PM

## 2019-09-27 NOTE — Progress Notes (Signed)
Patient indicated that he is unable to catch his breath and needed a PRN HHN.  BS are diminished bilaterally.  Deep breathing techniques taught along with IS.

## 2019-09-27 NOTE — Care Management Important Message (Signed)
Important Message  Patient Details  Name: Brandon Lewis MRN: [id number] Date of Birth: 06/10/1934   Medicare Important Message Given:  Yes     [name] 09/27/2019, 12:48 PM

## 2019-09-27 NOTE — Progress Notes (Signed)
Physical Therapy Treatment Patient Details Name: Brandon Lewis MRN: [id number] DOB: [birth year] Today's Date: 09/27/2019    History of Present Illness 85 y.o male with CAD s/p prior stent who presented with NSTEMI, now s/p CABG 11/27. CV risk factors of PAD s/p aortobifemoral bypass, HTN, HLD.    PT Comments    Patient seen for mobility progression. Pt is making progress toward PT goals and tolerated increased activity this session with HR better controlled. Continue to progress as tolerated.      Follow Up Recommendations  Home health PT;Supervision - Intermittent     Equipment Recommendations  None recommended by PT    Recommendations for Other Services       Precautions / Restrictions Precautions Precautions: Fall;Sternal Precaution Booklet Issued: Yes (comment) Precaution Comments: reviewed precautions handout with pt verbalizng all precautions Restrictions Weight Bearing Restrictions: Yes Other Position/Activity Restrictions: sternal precautions    Mobility  Bed Mobility               General bed mobility comments: pt sitting EOB upon arrival  Transfers Overall transfer level: Needs assistance Equipment used: Rolling walker (2 wheeled) Transfers: Sit to/from Stand Sit to Stand: Min assist         General transfer comment: assist to power up from elevated bed height; use of momentum  Ambulation/Gait Ambulation/Gait assistance: Supervision Gait Distance (Feet): 240 Feet Assistive device: Rolling walker (2 wheeled) Gait Pattern/deviations: Step-through pattern;Trunk flexed Gait velocity: decreased   General Gait Details: 2 standing rest breaks; cues for breathing technique    Stairs             Wheelchair Mobility    Modified Rankin (Stroke Patients Only)       Balance Overall balance assessment: Needs assistance Sitting-balance support: Feet supported;No upper extremity supported Sitting balance-Leahy Scale: Good     Standing  balance support: Bilateral upper extremity supported Standing balance-Leahy Scale: Poor Standing balance comment: reliant on BUE support                            Cognition Arousal/Alertness: Awake/alert Behavior During Therapy: WFL for tasks assessed/performed Overall Cognitive Status: Within Functional Limits for tasks assessed                                 General Comments: pt hard of hearing      Exercises      General Comments General comments (skin integrity, edema, etc.): HR 90s-110s throughout session and SpO2 99% on RA      Pertinent Vitals/Pain Pain Assessment: Faces Faces Pain Scale: Hurts a little bit Pain Location: generalized Pain Descriptors / Indicators: Discomfort;Aching Pain Intervention(s): Limited activity within patient's tolerance;Monitored during session;Repositioned    Home Living                      Prior Function            PT Goals (current goals can now be found in the care plan section) Acute Rehab PT Goals Patient Stated Goal: To return to independent ability Progress towards PT goals: Progressing toward goals    Frequency    Min 3X/week      PT Plan Current plan remains appropriate    Co-evaluation              AM-PAC PT "6 Clicks" Mobility   Outcome Measure  Help needed turning from your back to your side while in a flat bed without using bedrails?: A Little Help needed moving from lying on your back to sitting on the side of a flat bed without using bedrails?: A Little Help needed moving to and from a bed to a chair (including a wheelchair)?: A Little Help needed standing up from a chair using your arms (e.g., wheelchair or bedside chair)?: A Lot Help needed to walk in hospital room?: A Little Help needed climbing 3-5 steps with a railing? : A Lot 6 Click Score: 16    End of Session Equipment Utilized During Treatment: Gait belt Activity Tolerance: Patient tolerated treatment  well Patient left: with call bell/phone within reach;in chair Nurse Communication: Mobility status PT Visit Diagnosis: Other abnormalities of gait and mobility (R26.89)     Time: [id number] PT Time Calculation (min) (ACUTE ONLY): 26 min  Charges:  $Gait Training: 23-37 mins                     Earney Navy, PTA Acute Rehabilitation Services Pager: [phone] Office: [phone]     Darliss Cheney 09/27/2019, 1:29 PM

## 2019-09-28 ENCOUNTER — Inpatient Hospital Stay (HOSPITAL_COMMUNITY): Payer: Medicare Other

## 2019-09-28 LAB — GLUCOSE, CAPILLARY
Glucose-Capillary: 112 mg/dL — ABNORMAL HIGH (ref 70–99)
Glucose-Capillary: 167 mg/dL — ABNORMAL HIGH (ref 70–99)
Glucose-Capillary: 167 mg/dL — ABNORMAL HIGH (ref 70–99)
Glucose-Capillary: 208 mg/dL — ABNORMAL HIGH (ref 70–99)

## 2019-09-28 LAB — TSH: TSH: 4.217 u[IU]/mL (ref 0.350–4.500)

## 2019-09-28 MED ORDER — METOPROLOL TARTRATE 50 MG PO TABS
50.0000 mg | ORAL_TABLET | Freq: Two times a day (BID) | ORAL | Status: DC
  Administered 2019-09-28 – 2019-10-02 (×8): 50 mg via ORAL
  Filled 2019-09-28 (×8): qty 1

## 2019-09-28 MED ORDER — GUAIFENESIN ER 600 MG PO TB12
1200.0000 mg | ORAL_TABLET | Freq: Two times a day (BID) | ORAL | Status: DC
  Administered 2019-09-28 – 2019-10-02 (×9): 1200 mg via ORAL
  Filled 2019-09-28 (×9): qty 2

## 2019-09-28 NOTE — Progress Notes (Addendum)
      [address]       [place] [address]             [phone]      12 Days Post-Op Procedure(s) (LRB): CORONARY ARTERY BYPASS GRAFTING (CABG) using LIMA to LAD; Endoscopic right saphenous vein harvest to OM1, Diag1, and RCA. (N/A) TRANSESOPHAGEAL ECHOCARDIOGRAM (TEE) (N/A) INDOCYANINE GREEN FLUORESCENCE IMAGING (ICG) (N/A) Subjective: Feels okay this morning. His biggest complaint is fatigue.  Objective: Vital signs in last 24 hours: Temp:  [98.1 F (36.7 C)-98.6 F (37 C)] 98.5 F (36.9 C) (12/09 0401) Pulse Rate:  [65-99] 87 (12/09 0401) Cardiac Rhythm: Normal sinus rhythm (12/08 1901) Resp:  [17-27] 20 (12/09 0401) BP: (116-147)/(61-78) 116/61 (12/09 0401) SpO2:  [95 %-99 %] 95 % (12/09 0401) Weight:  [90.8 kg] 90.8 kg (12/09 [id number])     Intake/Output from previous day: 12/08 0701 - 12/09 0700 In: 483 [P.O.:340; I.V.:143] Out: 500 [Urine:500] Intake/Output this shift: No intake/output data recorded.  General appearance: alert, cooperative and no distress Heart: irregularly irregular rhythm Lungs: clear to auscultation bilaterally and expiratory wheezing Abdomen: abdominal exam benign other than some tenderness Extremities: 1+ pitting pedal edema Wound: clean and dry  Lab Results: Recent Labs    09/27/19 1135  WBC 19.3*  HGB 8.5*  HCT 27.9*  PLT 213   BMET:  Recent Labs    09/26/19 0437 09/27/19 0445  NA 141 139  K 4.0 3.9  CL 108 105  CO2 24 22  GLUCOSE 127* 221*  BUN 34* 41*  CREATININE 1.50* 1.82*  CALCIUM 8.8* 8.5*    PT/INR: No results for input(s): LABPROT, INR in the last 72 hours. ABG    Component Value Date/Time   PHART 7.313 (L) 09/16/2019 1922   HCO3 16.2 (L) 09/16/2019 1922   TCO2 17 (L) 09/16/2019 1922   ACIDBASEDEF 9.0 (H) 09/16/2019 1922   O2SAT 96.0 09/16/2019 1922   CBG (last 3)  Recent Labs    09/27/19 1622 09/27/19 2110 09/28/19 0633  GLUCAP 123* 172* 112*    Assessment/Plan: S/P  Procedure(s) (LRB): CORONARY ARTERY BYPASS GRAFTING (CABG) using LIMA to LAD; Endoscopic right saphenous vein harvest to OM1, Diag1, and RCA. (N/A) TRANSESOPHAGEAL ECHOCARDIOGRAM (TEE) (N/A) INDOCYANINE GREEN FLUORESCENCE IMAGING (ICG) (N/A)  1. CV-Aflutter on 12/7. Now on Amio PO. Irregular in the 90s this morning. Continue metoprolol. Cardiology following and advising about anticoagulation.  2. Pulm-tolerating room air with good oxygen saturation. Ordered a CXR this morning-will review when available.  3. Renal-creatinine continues to rise. No nephrotoxic drugs, will continue to trend. Baseline creatinine 1.4 4. H and H stable 5. Blood glucose has been stable.   Plan: Encourage water intake today. Added mucinex for secretions. Wheezing this morning-continue breathing treatments. Encouraged incentive spirometer and flutter valve use throughout the day. Will review CXR. Continue ambulation in the halls. Will try to call the son again today-called  Yesterday and got his voicemail.    LOS: 16 days    Elgie Collard 09/28/2019

## 2019-09-28 NOTE — Progress Notes (Signed)
      [address]       [place] [address]             [phone]      Needs support at home. He lives with his wife who cannot take care of him. Spoke with his son this morning. After CIR consult he would like case management to give him a call and go over plans for discharge. He wants to make sure they have the resources in place for his father.     Nicholes Rough, PA-C

## 2019-09-28 NOTE — TOC Progression Note (Signed)
Transition of Care (TOC) - Progression Note  Marvetta Gibbons RN, BSN Transitions of Care Unit 4E- RN Case Manager [phone]   Patient Details  Name: Brandon Lewis MRN: [id number] Date of Birth: [birth year]  Transition of Care [facility]) CM/SW Contact  Dahlia Client, Romeo Rabon, RN Phone Number: 09/28/2019, 4:05 PM  Clinical Narrative:    CIR consult noted- received notice from [facility] with CIR that pt would not be appropriate for CIR and that Home with [facility] was still appropriate plan for discharge per CIR review of the chart and PT notes. CM spoke with pt at bedside- to discuss transition of care concerns. Pt disappointed that rehab here would not be an option. Pt is concerns about going home and not being about to get up out of chair/bed by himself- once up he ambulates with min. Assist. - DME has been delivered to room for home- RW and 3n1. Pt's wife is disabled at home, uses wheelchair and patient pays caregivers to help with her care. Patient request that CM call his son to discuss transition options. Call made to son- [name]- with return call from [facility] to discuss transition plans. Explained to son that rehab would not be an option as patient is actually doing well enough to return home, just needs assistance at home. Son states they can look at arranging additional help for patient- requesting info on private duty agencies (as help for wife is not arranged through agency)- info provided to son on private duty agencies and how to look up online. Pt has referral for [facility] services with [facility]- Son states he will begin to look into private duty help today in prep for transition home. Plan at this time will be for pt to return home with [facility] and private duty assistance arranged by family.    Expected Discharge Plan: [place] Barriers to Discharge: Continued Medical Work up  Expected Discharge Plan and Services Expected Discharge Plan: [place]   Discharge Planning  Services: CM Consult Post Acute Care Choice: [place] arrangements for the past 2 months: Single Family Home                 DME Arranged: 3-N-1, Walker rolling DME Agency: [employer] Date DME Agency Contacted: 09/26/19 Time DME Agency Contacted: 61 Representative spoke with at DME Agency: [place]: RN, PT, OT [place] Date [place] 09/26/19 Time [place]: 1435 Representative spoke with at [place]: Cassie   Social Determinants of Health ([place]) Interventions    Readmission Risk Interventions No flowsheet data found.

## 2019-09-28 NOTE — Progress Notes (Signed)
Progress Note  Patient Name: Brandon Lewis Date of Encounter: 09/28/2019  Primary Cardiologist: Corey Skains, MD   Subjective   Patient ambulating without CP or SOB. Remains in NSR, rates good.  Inpatient Medications    Scheduled Meds: . amiodarone  400 mg Oral Daily  . aspirin  81 mg Oral Daily  . bisacodyl  10 mg Oral Daily   Or  . bisacodyl  10 mg Rectal Daily  . Chlorhexidine Gluconate Cloth  6 each Topical Daily  . clopidogrel  75 mg Oral Daily  . docusate sodium  200 mg Oral Daily  . enoxaparin (LOVENOX) injection  30 mg Subcutaneous QHS  . feeding supplement  1 Container Oral TID BM  . ferrous [id number] C-folic acid  1 capsule Oral BID PC  . guaiFENesin  1,200 mg Oral BID  . hydrocortisone  25 mg Rectal BID  . insulin aspart  0-24 Units Subcutaneous TID AC & HS  . ipratropium-albuterol  3 mL Nebulization TID  . mouth rinse  15 mL Mouth Rinse BID  . metoprolol tartrate  37.5 mg Oral BID  . mometasone-formoterol  2 puff Inhalation BID  . multivitamin with minerals  1 tablet Oral Daily  . pantoprazole  40 mg Oral Daily  . potassium chloride  20 mEq Oral Daily  . rosuvastatin  20 mg Oral q1800  . tamsulosin  0.4 mg Oral Daily   Continuous Infusions:  PRN Meds: albuterol, metoprolol tartrate, ondansetron (ZOFRAN) IV, traMADol, witch hazel-glycerin   Vital Signs    Vitals:   09/28/19 0017 09/28/19 0401 09/28/19 0633 09/28/19 0805  BP: (!) 145/69 116/61  (!) 126/55  Pulse: 90 87  95  Resp: 18 20  19   Temp: 98.6 F (37 C) 98.5 F (36.9 C)  97.9 F (36.6 C)  TempSrc: Oral Oral  Oral  SpO2: 96% 95%  97%  Weight:   90.8 kg   Height:        Intake/Output Summary (Last 24 hours) at 09/28/2019 0928 Last data filed at 09/28/2019 0400 Gross per 24 hour  Intake 483 ml  Output 500 ml  Net -17 ml   Last 3 Weights 09/28/2019 09/27/2019 09/26/2019  Weight (lbs) 200 lb 3.2 oz 201 lb 1 oz 201 lb 6.4 oz  Weight (kg) 90.81 kg 91.2 kg 91.354 kg    Telemetry    NSR with PACs and PVCs; atrial bigeminy; HR 90-100 - Personally Reviewed  ECG    Pending - Personally Reviewed  Physical Exam   GEN: No acute distress.   Neck: No JVD Cardiac: RRR, no murmurs, rubs, or gallops.  Respiratory: Clear to auscultation bilaterally. GI: Soft, nontender, non-distended  MS: No edema; No deformity. Neuro:  Nonfocal  Psych: Normal affect   Labs    High Sensitivity Troponin:   Recent Labs  Lab 09/11/19 0139 09/11/19 0334 09/11/19 0634 09/11/19 0806  TROPONINIHS 17 69* 81* 74*      Chemistry Recent Labs  Lab 09/21/19 1714 09/22/19 0411  09/25/19 0417 09/26/19 0437 09/27/19 0445  NA 138 139   < > 140 141 139  K 3.8 4.0   < > 3.7 4.0 3.9  CL 108 108   < > 107 108 105  CO2 20* 23   < > 23 24 22   GLUCOSE 167* 132*   < > 156* 127* 221*  BUN 58* 53*   < > 39* 34* 41*  CREATININE 1.79* 1.74*   < > 1.38* 1.50*  1.82*  CALCIUM 8.6* 8.6*   < > 9.0 8.8* 8.5*  PROT 5.4* 5.5*  --   --   --   --   ALBUMIN 2.3* 2.3*  --   --   --   --   AST 28 34  --   --   --   --   ALT 22 26  --   --   --   --   ALKPHOS 50 46  --   --   --   --   BILITOT 0.8 0.9  --   --   --   --   GFRNONAA 34* 35*   < > 47* 42* 34*  GFRAA 40* 41*   < > 54* 49* 39*  ANIONGAP 10 8   < > 10 9 12    < > = values in this interval not displayed.     Hematology Recent Labs  Lab 09/25/19 0417 09/27/19 1135  WBC 14.2* 19.3*  RBC 2.42* 2.51*  HGB 8.3* 8.5*  HCT 26.5* 27.9*  MCV 109.5* 111.2*  MCH 34.3* 33.9  MCHC 31.3 30.5  RDW 22.3* 22.6*  PLT 184 213    BNPNo results for input(s): BNP, PROBNP in the last 168 hours.   DDimer No results for input(s): DDIMER in the last 168 hours.   Radiology    Dg Chest Port 1 View  Result Date: 09/28/2019 CLINICAL DATA:  Shortness of breath. EXAM: PORTABLE CHEST 1 VIEW COMPARISON:  09/23/2019 FINDINGS: Right-sided PICC line unchanged. Lungs are hypoinflated with mild opacification over the left base/retrocardiac region  likely small effusion with atelectasis. Stable cardiomegaly. Remainder the exam is unchanged. IMPRESSION: 1. Mild left base/retrocardiac opacification likely small effusion with atelectasis. 2.  Stable cardiomegaly. 3.  Right-sided PICC line unchanged. Electronically Signed   By: Marin Olp M.D.   On: 09/28/2019 08:11    Cardiac Studies   Echo 09/19/19 1. Left ventricular ejection fraction, by visual estimation, is 70 to 75%. The left ventricle has hyperdynamic function. There is no left ventricular hypertrophy. 2. Global right ventricle has normal systolic function.The right ventricular size is normal. No increase in right ventricular wall thickness. 3. Left atrial size was normal. 4. Right atrial size was normal. 5. Mild mitral annular calcification. 6. The mitral valve is normal in structure. No evidence of mitral valve regurgitation. 7. The tricuspid valve is normal in structure. Tricuspid valve regurgitation is not demonstrated. 8. The aortic valve is tricuspid. Aortic valve regurgitation is not visualized. Mild aortic valve sclerosis without stenosis. 9. The pulmonic valve was not well visualized. Pulmonic valve regurgitation is not visualized.  Left heart catheterization 09/12/2019:  Mid LM to Dist LM lesion is 85% stenosed.  Prox Cx lesion is 50% stenosed.  Prox LAD to Mid LAD lesion is 85% stenosed.  Prox LAD lesion is 70% stenosed.  Mid LAD lesion is 45% stenosed.  Prox RCA to Mid RCA lesion is 70% stenosed.  Patient Profile     85 y.o. male  with CAD s/p prior stent who presented with NSTEMI, now s/p CABG 11/27. CV risk factors of PAD s/p aortobifemoral bypass, HTN, HLD.  Assessment & Plan    CAD s/p CABG - post op day 12 - Possible discharge today - continue Aspirin, Plavix, statin.  - metoprolol increased to 37.5 mg BID>> Can increase to home dose 50 mg  Brief episode of Aflutter - Patent was started on IV amio and converted to NSR after 2 hours.  -  Amio transitioned to 400 mg daily - Remains in NSR with PAcs and PVCs. Rates good - CHADSVASC = 5 (HTN, AGE, PVD, DM) - No plan for long term anticoagulation given post-op period and brief episode.   HLD - continue statin  HTN - continue metoprolol - No ACE/ARB for kidney function   For questions or updates, please contact [facility] Please consult [email or web address] for contact info under        Signed, Kaiel Weide Ninfa Meeker, PA-C  09/28/2019, 9:28 AM

## 2019-09-28 NOTE — Progress Notes (Signed)
CARDIAC REHAB PHASE I   PRE:  Rate/Rhythm: 98 SR with PACs    BP: sitting 129/71    SaO2: 95 RA  MODE:  Ambulation: 320 ft   POST:  Rate/Rhythm: 113 ST with PACs    BP: sitting 136/56     SaO2: 98 RA  Pt very nervous about potentially going home and not to CIR. He feels he needs much more rehab. I attempted to have him stand independently but he could not power up from edge of recliner (attempted x2). Able to stand with mod assist and rocking. Ambulated with RW, supervision assist. Increased distance by 40 ft but does get fatigued and SOB with distance. Several rests on return trip. To recliner. Encouraged more walks and IS/flutter. Will ask CM to talk with him today.  [place], ACSM 09/28/2019 9:31 AM

## 2019-09-28 NOTE — Progress Notes (Signed)
Occupational Therapy Treatment Patient Details Name: Brandon Lewis MRN: [id number] DOB: 1934/03/16 Today's Date: 09/28/2019    History of present illness 85 y.o male with CAD s/p prior stent who presented with NSTEMI, now s/p CABG 11/27. CV risk factors of PAD s/p aortobifemoral bypass, HTN, HLD.   OT comments  Pt making steady progress towards OT goals this session. Session focus on functional mobility and functional sit>stands as pt needs greatest assist to power up. Pt complete x2 sit>stands from recliner requiring MOD A to sit>stand needing cues to not use BUEs to push up. Attempted to let pt stand independently with no success. Pt completed functional mobility a household distance with RW and min guard assist. Provided education on needed DME and energy conservation strategies to incorporate into ADL routine at home. Pt anxious about going home and frustrated that he did not get into CIR. DC plan remains appropriate, will continue to follow acutely per POC.    Follow Up Recommendations  Home health OT;Supervision/Assistance - 24 hour;Other (comment)(initially)    Equipment Recommendations  3 in 1 bedside commode    Recommendations for Other Services      Precautions / Restrictions Precautions Precautions: Fall;Sternal Precaution Booklet Issued: Yes (comment) Precaution Comments: reviewed precautions handout with pt verbalizng all precautions Restrictions Other Position/Activity Restrictions: sternal precautions       Mobility Bed Mobility               General bed mobility comments: pt OOB in recliner  Transfers Overall transfer level: Needs assistance Equipment used: Rolling walker (2 wheeled) Transfers: Sit to/from Stand Sit to Stand: Mod assist         General transfer comment: heavy MOD A for sit>stand from recliner x2 trials    Balance Overall balance assessment: Needs assistance Sitting-balance support: Feet supported;No upper extremity  supported Sitting balance-Leahy Scale: Good     Standing balance support: Bilateral upper extremity supported Standing balance-Leahy Scale: Poor Standing balance comment: reliant on BUE support                           ADL either performed or assessed with clinical judgement   ADL Overall ADL's : Needs assistance/impaired                         Toilet Transfer: Ambulation;RW;Moderate assistance;Cueing for safety;Min guard Toilet Transfer Details (indicate cue type and reason): simulated toilet transfer via functional mobility; pt needs most assist for initial sit>stand needing cues to not push with BUEs d/t sternal precautions.min guard for functional mobility to<>from surfaces         Functional mobility during ADLs: Min guard;Rolling walker General ADL Comments: session focus on functional sit>stands as precursor to higher level ADLs and functional mobiliy     Vision Baseline Vision/History: No visual deficits Vision Assessment?: No apparent visual deficits   Perception     Praxis      Cognition Arousal/Alertness: Awake/alert Behavior During Therapy: WFL for tasks assessed/performed;Anxious Overall Cognitive Status: Within Functional Limits for tasks assessed                                 General Comments: pt hard of hearing. pt reports frustration about not being admitted to CIR, general anxiety about going home        Exercises     Shoulder Instructions  General Comments vss    Pertinent Vitals/ Pain       Pain Assessment: Faces Faces Pain Scale: Hurts a little bit Pain Location: generalized Pain Descriptors / Indicators: Discomfort;Aching Pain Intervention(s): Limited activity within patient's tolerance;Monitored during session;Repositioned  Home Living                                          Prior Functioning/Environment              Frequency  Min 2X/week        Progress  Toward Goals  OT Goals(current goals can now be found in the care plan section)  Progress towards OT goals: Progressing toward goals  Acute Rehab OT Goals Patient Stated Goal: To return to independent ability OT Goal Formulation: With patient Time For Goal Achievement: 10/09/19 Potential to Achieve Goals: Good  Plan Discharge plan remains appropriate    Co-evaluation                 AM-PAC OT "6 Clicks" Daily Activity     Outcome Measure   Help from another person eating meals?: None Help from another person taking care of personal grooming?: A Little Help from another person toileting, which includes using toliet, bedpan, or urinal?: A Little Help from another person bathing (including washing, rinsing, drying)?: A Little Help from another person to put on and taking off regular upper body clothing?: A Lot Help from another person to put on and taking off regular lower body clothing?: A Lot 6 Click Score: 17    End of Session Equipment Utilized During Treatment: Rolling walker  OT Visit Diagnosis: Unsteadiness on feet (R26.81);Muscle weakness (generalized) (M62.81)   Activity Tolerance Patient tolerated treatment well   Patient Left in chair;with call bell/phone within reach   Nurse Communication          Time: [id number] OT Time Calculation (min): 26 min  Charges: OT General Charges $OT Visit: 1 Visit OT Treatments $Therapeutic Activity: 23-37 mins  [name], COTA/L Acute Rehabilitation Services [phone]    [name] 09/28/2019, 1:37 PM

## 2019-09-28 NOTE — Progress Notes (Signed)
Thank you for consult on Brandon Lewis. Chart reviewed and note that he is progressing well with therapy. Concur with recommendations of HHPT/HHOT. Will defer CIR consult for now.

## 2019-09-28 NOTE — Progress Notes (Signed)
12 Days Post-Op Procedure(s) (LRB): CORONARY ARTERY BYPASS GRAFTING (CABG) using LIMA to LAD; Endoscopic right saphenous vein harvest to OM1, Diag1, and RCA. (N/A) TRANSESOPHAGEAL ECHOCARDIOGRAM (TEE) (N/A) INDOCYANINE GREEN FLUORESCENCE IMAGING (ICG) (N/A) Subjective: No specific complaints  Objective: Vital signs in last 24 hours: Temp:  [97.9 F (36.6 C)-98.6 F (37 C)] 97.9 F (36.6 C) (12/09 0805) Pulse Rate:  [65-99] 95 (12/09 0805) Cardiac Rhythm: Normal sinus rhythm (12/08 1901) Resp:  [17-27] 19 (12/09 0805) BP: (116-147)/(55-78) 126/55 (12/09 0805) SpO2:  [95 %-99 %] 97 % (12/09 0805) Weight:  [90.8 kg] 90.8 kg (12/09 [id number])  Hemodynamic parameters for last 24 hours:    Intake/Output from previous day: 12/08 0701 - 12/09 0700 In: 483 [P.O.:340; I.V.:143] Out: 500 [Urine:500] Intake/Output this shift: No intake/output data recorded.  General appearance: alert and cooperative Neurologic: intact Heart: regular rate and rhythm, S1, S2 normal, no murmur, click, rub or gallop Lungs: clear to auscultation bilaterally Abdomen: soft, non-tender; bowel sounds normal; no masses,  no organomegaly Extremities: edema mild Wound: c/d/i  Lab Results: Recent Labs    09/27/19 1135  WBC 19.3*  HGB 8.5*  HCT 27.9*  PLT 213   BMET:  Recent Labs    09/26/19 0437 09/27/19 0445  NA 141 139  K 4.0 3.9  CL 108 105  CO2 24 22  GLUCOSE 127* 221*  BUN 34* 41*  CREATININE 1.50* 1.82*  CALCIUM 8.8* 8.5*    PT/INR: No results for input(s): LABPROT, INR in the last 72 hours. ABG    Component Value Date/Time   PHART 7.313 (L) 09/16/2019 1922   HCO3 16.2 (L) 09/16/2019 1922   TCO2 17 (L) 09/16/2019 1922   ACIDBASEDEF 9.0 (H) 09/16/2019 1922   O2SAT 96.0 09/16/2019 1922   CBG (last 3)  Recent Labs    09/27/19 1622 09/27/19 2110 09/28/19 0633  GLUCAP 123* 172* 112*    Assessment/Plan: S/P Procedure(s) (LRB): CORONARY ARTERY BYPASS GRAFTING (CABG) using LIMA to  LAD; Endoscopic right saphenous vein harvest to OM1, Diag1, and RCA. (N/A) TRANSESOPHAGEAL ECHOCARDIOGRAM (TEE) (N/A) INDOCYANINE GREEN FLUORESCENCE IMAGING (ICG) (N/A) CIR consult  Urine culture for WBC 19   LOS: 16 days    Brandon Lewis 09/28/2019

## 2019-09-29 LAB — CBC WITH DIFFERENTIAL/PLATELET
Abs Immature Granulocytes: 0.11 10*3/uL — ABNORMAL HIGH (ref 0.00–0.07)
Basophils Absolute: 0.1 10*3/uL (ref 0.0–0.1)
Basophils Relative: 1 %
Eosinophils Absolute: 0.1 10*3/uL (ref 0.0–0.5)
Eosinophils Relative: 1 %
HCT: 23.9 % — ABNORMAL LOW (ref 39.0–52.0)
Hemoglobin: 7.4 g/dL — ABNORMAL LOW (ref 13.0–17.0)
Immature Granulocytes: 1 %
Lymphocytes Relative: 10 %
Lymphs Abs: 1.3 10*3/uL (ref 0.7–4.0)
MCH: 33.9 pg (ref 26.0–34.0)
MCHC: 31 g/dL (ref 30.0–36.0)
MCV: 109.6 fL — ABNORMAL HIGH (ref 80.0–100.0)
Monocytes Absolute: 1.3 10*3/uL — ABNORMAL HIGH (ref 0.1–1.0)
Monocytes Relative: 11 %
Neutro Abs: 9.6 10*3/uL — ABNORMAL HIGH (ref 1.7–7.7)
Neutrophils Relative %: 76 %
Platelets: 172 10*3/uL (ref 150–400)
RBC: 2.18 MIL/uL — ABNORMAL LOW (ref 4.22–5.81)
RDW: 22.5 % — ABNORMAL HIGH (ref 11.5–15.5)
WBC: 12.5 10*3/uL — ABNORMAL HIGH (ref 4.0–10.5)
nRBC: 0 % (ref 0.0–0.2)

## 2019-09-29 LAB — GLUCOSE, CAPILLARY
Glucose-Capillary: 114 mg/dL — ABNORMAL HIGH (ref 70–99)
Glucose-Capillary: 182 mg/dL — ABNORMAL HIGH (ref 70–99)
Glucose-Capillary: 191 mg/dL — ABNORMAL HIGH (ref 70–99)
Glucose-Capillary: 117 mg/dL — ABNORMAL HIGH (ref 70–99)

## 2019-09-29 LAB — BASIC METABOLIC PANEL
Anion gap: 8 (ref 5–15)
BUN: 36 mg/dL — ABNORMAL HIGH (ref 8–23)
CO2: 22 mmol/L (ref 22–32)
Calcium: 8.7 mg/dL — ABNORMAL LOW (ref 8.9–10.3)
Chloride: 109 mmol/L (ref 98–111)
Creatinine, Ser: 1.71 mg/dL — ABNORMAL HIGH (ref 0.61–1.24)
GFR calc Af Amer: 42 mL/min — ABNORMAL LOW (ref >60)
GFR calc non Af Amer: 36 mL/min — ABNORMAL LOW (ref >60)
Glucose, Bld: 141 mg/dL — ABNORMAL HIGH (ref 70–99)
Potassium: 4.4 mmol/L (ref 3.5–5.1)
Sodium: 139 mmol/L (ref 135–145)

## 2019-09-29 LAB — URINE CULTURE: Culture: <10000 — AB

## 2019-09-29 NOTE — Progress Notes (Signed)
CARDIAC REHAB PHASE I   PRE:  Rate/Rhythm: 84 SR    BP: sitting 128/70    SaO2: 98 RA  MODE:  Ambulation: 320 ft   POST:  Rate/Rhythm: 101 ST    BP: sitting 125/63     SaO2: 98 RA  Pt still unable to stand independently, needed mod-max assist at arm. Ambulated independently with RW although c/o SOB, fatigue and claudication sx which requires him to rest several times. He does st his breathing feels better after a breathing tx. Return to recliner. Encouraged another walk and IS/flutter. Pt agreeable.  [place], ACSM 09/29/2019 3:18 PM

## 2019-09-29 NOTE — Progress Notes (Signed)
Physical Therapy Treatment Patient Details Name: Brandon Lewis MRN: [id number] DOB: 1934/09/07 Today's Date: 09/29/2019    History of Present Illness 85 y.o male with CAD s/p prior stent who presented with NSTEMI, now s/p CABG 11/27. CV risk factors of PAD s/p aortobifemoral bypass, HTN, HLD.    PT Comments    Patient seen for mobility progression. Pt requires mod A for supine to sit, min/mod A for transfer training, and supervision for gait training. Pt tolerated gait distance of 250 ft with 2 brief standing rest breaks due to DOE. VSS on RA. Continue to progress as tolerated.    Follow Up Recommendations  Home health PT;Supervision - Intermittent     Equipment Recommendations  None recommended by PT    Recommendations for Other Services       Precautions / Restrictions Precautions Precautions: Fall;Sternal Precaution Comments: reviewed during functional mobility Restrictions Weight Bearing Restrictions: Yes    Mobility  Bed Mobility Overal bed mobility: Needs Assistance Bed Mobility: Supine to Sit     Supine to sit: Mod assist     General bed mobility comments: pt able to bring bilat LE/hips to EOB and then assistance required to power up into sitting while  adhering to sternal precuations  Transfers Overall transfer level: Needs assistance Equipment used: Rolling walker (2 wheeled) Transfers: Sit to/from Stand Sit to Stand: Mod assist;Min assist         General transfer comment: min A to power up from EOB and mod A to power up from commode (lower surface)  Ambulation/Gait Ambulation/Gait assistance: Supervision Gait Distance (Feet): 250 Feet Assistive device: Rolling walker (2 wheeled) Gait Pattern/deviations: Step-through pattern Gait velocity: decreased   General Gait Details: cues for breathing technique; slow cadence; supervision for safety and VSS on RA while ambulating; increased RR and DOE   Chief Strategy Officer    Modified Rankin (Stroke Patients Only)       Balance Overall balance assessment: Needs assistance Sitting-balance support: Feet supported;No upper extremity supported Sitting balance-Leahy Scale: Good     Standing balance support: Bilateral upper extremity supported;During functional activity Standing balance-Leahy Scale: Poor Standing balance comment: pt able to static standing without UE support for hand hygiene                            Cognition Arousal/Alertness: Awake/alert Behavior During Therapy: WFL for tasks assessed/performed;Anxious Overall Cognitive Status: Within Functional Limits for tasks assessed                                 General Comments: HOH      Exercises      General Comments        Pertinent Vitals/Pain Pain Assessment: Faces Faces Pain Scale: Hurts a little bit Pain Location: chest and buttocks (hemorrhoids)  Pain Descriptors / Indicators: Discomfort;Grimacing;Guarding Pain Intervention(s): Repositioned    Home Living                      Prior Function            PT Goals (current goals can now be found in the care plan section) Progress towards PT goals: Progressing toward goals    Frequency    Min 3X/week      PT Plan Current plan remains appropriate  Co-evaluation              AM-PAC PT "6 Clicks" Mobility   Outcome Measure  Help needed turning from your back to your side while in a flat bed without using bedrails?: A Little Help needed moving from lying on your back to sitting on the side of a flat bed without using bedrails?: A Little Help needed moving to and from a bed to a chair (including a wheelchair)?: A Little Help needed standing up from a chair using your arms (e.g., wheelchair or bedside chair)?: A Lot Help needed to walk in hospital room?: A Little Help needed climbing 3-5 steps with a railing? : A Lot 6 Click Score: 16    End of Session Equipment  Utilized During Treatment: Gait belt Activity Tolerance: Patient tolerated treatment well Patient left: with call bell/phone within reach;in chair Nurse Communication: Mobility status PT Visit Diagnosis: Other abnormalities of gait and mobility (R26.89)     Time: 0922-1000 PT Time Calculation (min) (ACUTE ONLY): 38 min  Charges:  $Gait Training: 23-37 mins $Therapeutic Activity: 8-22 mins                     Earney Navy, PTA Acute Rehabilitation Services Pager: [phone] Office: [phone]     Darliss Cheney 09/29/2019, 11:28 AM

## 2019-09-29 NOTE — Progress Notes (Signed)
[address]       [place] [address]             [phone]      13 Days Post-Op Procedure(s) (LRB): CORONARY ARTERY BYPASS GRAFTING (CABG) using LIMA to LAD; Endoscopic right saphenous vein harvest to OM1, Diag1, and RCA. (N/A) TRANSESOPHAGEAL ECHOCARDIOGRAM (TEE) (N/A) INDOCYANINE GREEN FLUORESCENCE IMAGING (ICG) (N/A) Subjective: DOE, wheezing at times- feels better after nebs  Objective: Vital signs in last 24 hours: Temp:  [97.6 F (36.4 C)-98.2 F (36.8 C)] 97.9 F (36.6 C) (12/10 0811) Pulse Rate:  [82-97] 93 (12/10 0811) Cardiac Rhythm: Normal sinus rhythm (12/10 0745) Resp:  [17-25] 19 (12/10 0811) BP: (126-143)/(67-78) 128/78 (12/10 0811) SpO2:  [89 %-100 %] 94 % (12/10 0811) Weight:  [89.2 kg] 89.2 kg (12/10 0453)  Hemodynamic parameters for last 24 hours:    Intake/Output from previous day: 12/09 0701 - 12/10 0700 In: -  Out: 1200 [Urine:1200] Intake/Output this shift: No intake/output data recorded.  General appearance: alert, cooperative and no distress Heart: regular rate and rhythm Lungs: fair air exchange, some exp wheeze Abdomen: benign Extremities: + LE edema Wound: incis healing well  Lab Results: Recent Labs    09/27/19 1135 09/29/19 0315  WBC 19.3* 12.5*  HGB 8.5* 7.4*  HCT 27.9* 23.9*  PLT 213 172   BMET:  Recent Labs    09/27/19 0445 09/29/19 0315  NA 139 139  K 3.9 4.4  CL 105 109  CO2 22 22  GLUCOSE 221* 141*  BUN 41* 36*  CREATININE 1.82* 1.71*  CALCIUM 8.5* 8.7*    PT/INR: No results for input(s): LABPROT, INR in the last 72 hours. ABG    Component Value Date/Time   PHART 7.313 (L) 09/16/2019 1922   HCO3 16.2 (L) 09/16/2019 1922   TCO2 17 (L) 09/16/2019 1922   ACIDBASEDEF 9.0 (H) 09/16/2019 1922   O2SAT 96.0 09/16/2019 1922   CBG (last 3)  Recent Labs    09/28/19 1623 09/28/19 2118 09/29/19 0636  GLUCAP 167* 208* 114*    Meds Scheduled Meds: . amiodarone  400 mg Oral Daily   . aspirin  81 mg Oral Daily  . bisacodyl  10 mg Oral Daily   Or  . bisacodyl  10 mg Rectal Daily  . Chlorhexidine Gluconate Cloth  6 each Topical Daily  . clopidogrel  75 mg Oral Daily  . docusate sodium  200 mg Oral Daily  . feeding supplement  1 Container Oral TID BM  . ferrous [id number] C-folic acid  1 capsule Oral BID PC  . guaiFENesin  1,200 mg Oral BID  . hydrocortisone  25 mg Rectal BID  . insulin aspart  0-24 Units Subcutaneous TID AC & HS  . ipratropium-albuterol  3 mL Nebulization TID  . mouth rinse  15 mL Mouth Rinse BID  . metoprolol tartrate  50 mg Oral BID  . mometasone-formoterol  2 puff Inhalation BID  . multivitamin with minerals  1 tablet Oral Daily  . pantoprazole  40 mg Oral Daily  . potassium chloride  20 mEq Oral Daily  . rosuvastatin  20 mg Oral q1800  . tamsulosin  0.4 mg Oral Daily   Continuous Infusions: PRN Meds:.albuterol, ondansetron (ZOFRAN) IV, traMADol, witch hazel-glycerin  Xrays DG CHEST PORT 1 VIEW  Result Date: 09/28/2019 CLINICAL DATA:  Shortness of breath. EXAM: PORTABLE CHEST 1 VIEW COMPARISON:  09/23/2019 FINDINGS: Right-sided PICC line unchanged. Lungs are hypoinflated with mild  opacification over the left base/retrocardiac region likely small effusion with atelectasis. Stable cardiomegaly. Remainder the exam is unchanged. IMPRESSION: 1. Mild left base/retrocardiac opacification likely small effusion with atelectasis. 2.  Stable cardiomegaly. 3.  Right-sided PICC line unchanged. Electronically Signed   By: Marin Olp M.D.   On: 09/28/2019 08:11    Assessment/Plan: S/P Procedure(s) (LRB): CORONARY ARTERY BYPASS GRAFTING (CABG) using LIMA to LAD; Endoscopic right saphenous vein harvest to OM1, Diag1, and RCA. (N/A) TRANSESOPHAGEAL ECHOCARDIOGRAM (TEE) (N/A) INDOCYANINE GREEN FLUORESCENCE IMAGING (ICG) (N/A)  1 Not accepted by CIR 2 hemodyn stable in sinus rhythm, some PAC's, omn amio/lopressor sats mostly good on RA- cont  nebs 3 creat conts to rise slowly, currently not on diuretic . Remains edematous but pretty stable- EF preop is normal- hyperdynamic at 70-75%. Does not appear to be on any significant nephrotoxic meds 4 leukocytosis trend improving, urine cx ordered- no fevers 5 BS fair control- manage with dirt- no preop DM meds, HgA1c 6.5 6 H/H cont to decrease- , cont trinsicon, he does have hemmorrhoidal bleeding- close to transfusion threshold- monitor 7 cont therapies- he remains very concerned he won't do well at home  LOS: 17 days    John Giovanni PA-C 09/29/2019

## 2019-09-30 LAB — GLUCOSE, CAPILLARY
Glucose-Capillary: 134 mg/dL — ABNORMAL HIGH (ref 70–99)
Glucose-Capillary: 123 mg/dL — ABNORMAL HIGH (ref 70–99)
Glucose-Capillary: 129 mg/dL — ABNORMAL HIGH (ref 70–99)
Glucose-Capillary: 122 mg/dL — ABNORMAL HIGH (ref 70–99)

## 2019-09-30 NOTE — Progress Notes (Signed)
      [address]       [place] [address]             [phone]      14 Days Post-Op Procedure(s) (LRB): CORONARY ARTERY BYPASS GRAFTING (CABG) using LIMA to LAD; Endoscopic right saphenous vein harvest to OM1, Diag1, and RCA. (N/A) TRANSESOPHAGEAL ECHOCARDIOGRAM (TEE) (N/A) INDOCYANINE GREEN FLUORESCENCE IMAGING (ICG) (N/A) Subjective: Feels okay this morning.   Objective: Vital signs in last 24 hours: Temp:  [97.6 F (36.4 C)-99.9 F (37.7 C)] 98 F (36.7 C) (12/11 0746) Pulse Rate:  [63-93] 90 (12/11 0746) Cardiac Rhythm: Normal sinus rhythm (12/11 0353) Resp:  [18-32] 22 (12/11 0746) BP: (114-155)/(57-78) 114/57 (12/11 0746) SpO2:  [92 %-100 %] 99 % (12/11 0746) Weight:  [89.1 kg] 89.1 kg (12/11 0353)  Hemodynamic parameters for last 24 hours:    Intake/Output from previous day: 12/10 0701 - 12/11 0700 In: 240 [P.O.:240] Out: 1150 [Urine:1150] Intake/Output this shift: Total I/O In: -  Out: 100 [Urine:100]  General appearance: alert, cooperative and no distress Heart: regular rate and rhythm, S1, S2 normal, no murmur, click, rub or gallop Lungs: clear to auscultation bilaterally and expiratory wheezing Abdomen: soft, non-tender; bowel sounds normal; no masses,  no organomegaly Extremities: extremities normal, atraumatic, no cyanosis or edema Wound: clean and dry  Lab Results: Recent Labs    09/27/19 1135 09/29/19 0315  WBC 19.3* 12.5*  HGB 8.5* 7.4*  HCT 27.9* 23.9*  PLT 213 172   BMET:  Recent Labs    09/29/19 0315  NA 139  K 4.4  CL 109  CO2 22  GLUCOSE 141*  BUN 36*  CREATININE 1.71*  CALCIUM 8.7*    PT/INR: No results for input(s): LABPROT, INR in the last 72 hours. ABG    Component Value Date/Time   PHART 7.313 (L) 09/16/2019 1922   HCO3 16.2 (L) 09/16/2019 1922   TCO2 17 (L) 09/16/2019 1922   ACIDBASEDEF 9.0 (H) 09/16/2019 1922   O2SAT 96.0 09/16/2019 1922   CBG (last 3)  Recent Labs    09/29/19 1554  09/29/19 2112 09/30/19 0602  GLUCAP 191* 117* 134*    Assessment/Plan: S/P Procedure(s) (LRB): CORONARY ARTERY BYPASS GRAFTING (CABG) using LIMA to LAD; Endoscopic right saphenous vein harvest to OM1, Diag1, and RCA. (N/A) TRANSESOPHAGEAL ECHOCARDIOGRAM (TEE) (N/A) INDOCYANINE GREEN FLUORESCENCE IMAGING (ICG) (N/A)  1 Not accepted by CIR/ son is worried about him getting discharged home 2 hemodyn stable in sinus rhythm, some PAC's, omn amio/lopressor sats mostly good on RA- cont nebs 3 creat conts to rise slowly, currently not on diuretic . Remains edematous but pretty stable- EF preop is normal- hyperdynamic at 70-75%. Does not appear to be on any significant nephrotoxic meds 4 leukocytosis trend improving, urine cx no significant growth- no fevers 5 BS fair control- no preop DM meds, HgA1c 6.5 6 H/H cont to decrease- , cont trinsicon, he does have hemmorrhoidal bleeding- close to transfusion threshold- monitor  Plan: Labs for the AM. CXR for the morning to monitor pleural effusion. Continue aggressive PT/OT    LOS: 18 days    Elgie Collard 09/30/2019

## 2019-09-30 NOTE — Progress Notes (Signed)
Spoke with son regarding transition plans- he has been in contact with an agency for private duty needs- he will plan for a potential d/c on Sunday with the agency in case pt medically stable for discharge. Pt has needed DME that has been delivered to room, and [facility] has been arranged with [facility].

## 2019-09-30 NOTE — Progress Notes (Signed)
CARDIAC REHAB PHASE I   PRE:  Rate/Rhythm: 87 SR    BP: sitting 141/75    SaO2: 95 RA  MODE:  Ambulation: 320 ft   POST:  Rate/Rhythm: 101 ST    BP: sitting      SaO2: 97 RA  Pt asleep on arrival. Able to get to EOB and stand from elevated surface. Ambulated independently with RW. He is actually less DOE today. Less rest stops. To BR for BM after walk, then recliner. Son present and observing pts progress. Encouraged pt to comb his own hair which he did.  [id number]  [place], ACSM 09/30/2019 11:39 AM

## 2019-09-30 NOTE — Progress Notes (Signed)
Physical Therapy Treatment Patient Details Name: Brandon Lewis MRN: [id number] DOB: [birth year] Today's Date: 09/30/2019    History of Present Illness 85 y.o male with CAD s/p prior stent who presented with NSTEMI, now s/p CABG 11/27. CV risk factors of PAD s/p aortobifemoral bypass, HTN, HLD.    PT Comments    Patient continues to make progress toward PT goals and tolerated increased gait distance this session although does continue to become dyspneic while ambulating. Pt demonstrates good carry over breathing technique and is aware of when he needs to take a rest break. Overall pt requires min guard/min A for OOB mobility. Continue to progress as tolerated.    Follow Up Recommendations  Home health PT;Supervision - Intermittent     Equipment Recommendations  None recommended by PT    Recommendations for Other Services       Precautions / Restrictions Precautions Precautions: Fall;Sternal    Mobility  Bed Mobility Overal bed mobility: Needs Assistance Bed Mobility: Sit to Sidelying         Sit to sidelying: Mod assist General bed mobility comments: cues for sequencing; assist to bring bilat LE into bed; use of heart pillow  Transfers Overall transfer level: Needs assistance Equipment used: Rolling walker (2 wheeled) Transfers: Sit to/from Stand Sit to Stand: Min assist         General transfer comment: assist to power up into standing and to steady X 3 trials with pt hugging heart pillow  Ambulation/Gait Ambulation/Gait assistance: Supervision Gait Distance (Feet): 300 Feet Assistive device: Rolling walker (2 wheeled) Gait Pattern/deviations: Step-through pattern Gait velocity: decreased   General Gait Details: 3 standing rest breaks due to DOE; SpO2 and HR WNL; increased RR to 31; pt demonstrates good carry over of breathing technique   Stairs             Wheelchair Mobility    Modified Rankin (Stroke Patients Only)       Balance  Overall balance assessment: Needs assistance Sitting-balance support: Feet supported;No upper extremity supported Sitting balance-Leahy Scale: Good     Standing balance support: Bilateral upper extremity supported;During functional activity Standing balance-Leahy Scale: Poor                              Cognition Arousal/Alertness: Awake/alert Behavior During Therapy: WFL for tasks assessed/performed;Anxious Overall Cognitive Status: Within Functional Limits for tasks assessed                                 General Comments: HOH      Exercises      General Comments        Pertinent Vitals/Pain Pain Assessment: Faces Faces Pain Scale: Hurts a little bit Pain Location: buttocks (hemorrhoids)  Pain Descriptors / Indicators: Discomfort;Guarding Pain Intervention(s): Repositioned    Home Living                      Prior Function            PT Goals (current goals can now be found in the care plan section) Progress towards PT goals: Progressing toward goals    Frequency    Min 3X/week      PT Plan Current plan remains appropriate    Co-evaluation              AM-PAC PT "6 Clicks" Mobility  Outcome Measure  Help needed turning from your back to your side while in a flat bed without using bedrails?: A Little Help needed moving from lying on your back to sitting on the side of a flat bed without using bedrails?: A Little Help needed moving to and from a bed to a chair (including a wheelchair)?: A Little Help needed standing up from a chair using your arms (e.g., wheelchair or bedside chair)?: A Little Help needed to walk in hospital room?: A Little Help needed climbing 3-5 steps with a railing? : A Lot 6 Click Score: 17    End of Session Equipment Utilized During Treatment: Gait belt Activity Tolerance: Patient tolerated treatment well Patient left: with call bell/phone within reach;in bed Nurse Communication:  Mobility status PT Visit Diagnosis: Other abnormalities of gait and mobility (R26.89)     Time: [id number] PT Time Calculation (min) (ACUTE ONLY): 28 min  Charges:  $Gait Training: 23-37 mins                     Earney Navy, PTA Acute Rehabilitation Services Pager: [phone] Office: [phone]     Darliss Cheney 09/30/2019, 5:23 PM

## 2019-10-01 ENCOUNTER — Inpatient Hospital Stay (HOSPITAL_COMMUNITY): Payer: Medicare Other

## 2019-10-01 LAB — BASIC METABOLIC PANEL
Anion gap: 8 (ref 5–15)
BUN: 30 mg/dL — ABNORMAL HIGH (ref 8–23)
CO2: 21 mmol/L — ABNORMAL LOW (ref 22–32)
Calcium: 8.9 mg/dL (ref 8.9–10.3)
Chloride: 110 mmol/L (ref 98–111)
Creatinine, Ser: 1.61 mg/dL — ABNORMAL HIGH (ref 0.61–1.24)
GFR calc Af Amer: 45 mL/min — ABNORMAL LOW (ref >60)
GFR calc non Af Amer: 39 mL/min — ABNORMAL LOW (ref >60)
Glucose, Bld: 128 mg/dL — ABNORMAL HIGH (ref 70–99)
Potassium: 4.7 mmol/L (ref 3.5–5.1)
Sodium: 139 mmol/L (ref 135–145)

## 2019-10-01 LAB — GLUCOSE, CAPILLARY
Glucose-Capillary: 129 mg/dL — ABNORMAL HIGH (ref 70–99)
Glucose-Capillary: 94 mg/dL (ref 70–99)
Glucose-Capillary: 130 mg/dL — ABNORMAL HIGH (ref 70–99)
Glucose-Capillary: 189 mg/dL — ABNORMAL HIGH (ref 70–99)

## 2019-10-01 LAB — CBC
HCT: 25.3 % — ABNORMAL LOW (ref 39.0–52.0)
Hemoglobin: 7.5 g/dL — ABNORMAL LOW (ref 13.0–17.0)
MCH: 33.9 pg (ref 26.0–34.0)
MCHC: 29.6 g/dL — ABNORMAL LOW (ref 30.0–36.0)
MCV: 114.5 fL — ABNORMAL HIGH (ref 80.0–100.0)
Platelets: 174 10*3/uL (ref 150–400)
RBC: 2.21 MIL/uL — ABNORMAL LOW (ref 4.22–5.81)
RDW: 22 % — ABNORMAL HIGH (ref 11.5–15.5)
WBC: 9.7 10*3/uL (ref 4.0–10.5)
nRBC: 0 % (ref 0.0–0.2)

## 2019-10-01 NOTE — Progress Notes (Addendum)
15 Days Post-Op Procedure(s) (LRB): CORONARY ARTERY BYPASS GRAFTING (CABG) using LIMA to LAD; Endoscopic right saphenous vein harvest to OM1, Diag1, and RCA. (N/A) TRANSESOPHAGEAL ECHOCARDIOGRAM (TEE) (N/A) INDOCYANINE GREEN FLUORESCENCE IMAGING (ICG) (N/A) Subjective: Awake and alert, walked in the hall this AM with the rolling walker. Says he is still needing assistance with getting out of bed and up from the chair.   Objective: Vital signs in last 24 hours: Temp:  [98.1 F (36.7 C)-99 F (37.2 C)] 98.1 F (36.7 C) (12/12 0820) Pulse Rate:  [81-91] 81 (12/12 0821) Cardiac Rhythm: Normal sinus rhythm (12/12 0700) Resp:  [14-20] 20 (12/12 0821) BP: (114-138)/(47-81) 125/62 (12/12 0821) SpO2:  [93 %-100 %] 100 % (12/12 0821) FiO2 (%):  [21 %] 21 % (12/12 0821) Weight:  [88.8 kg] 88.8 kg (12/12 0324)      Intake/Output from previous day: 12/11 0701 - 12/12 0700 In: 460 [P.O.:460] Out: 1325 [Urine:1325] Intake/Output this shift: Total I/O In: 250 [P.O.:250] Out: 100 [Urine:100]  General appearance: alert, cooperative and mild distress Neurologic: intact Heart: regular rate and rhythm and Monitor shows stable SR.  Lungs: Breath sound are clear (just had a breathing treatment) Extremities: Minimal LE edema. Wound: the sternal incision is intact and dry, covered with Dermabond.  Lab Results: Recent Labs    09/29/19 0315 10/01/19 0327  WBC 12.5* 9.7  HGB 7.4* 7.5*  HCT 23.9* 25.3*  PLT 172 174   BMET:  Recent Labs    09/29/19 0315 10/01/19 0327  NA 139 139  K 4.4 4.7  CL 109 110  CO2 22 21*  GLUCOSE 141* 128*  BUN 36* 30*  CREATININE 1.71* 1.61*  CALCIUM 8.7* 8.9    PT/INR: No results for input(s): LABPROT, INR in the last 72 hours. ABG    Component Value Date/Time   PHART 7.313 (L) 09/16/2019 1922   HCO3 16.2 (L) 09/16/2019 1922   TCO2 17 (L) 09/16/2019 1922   ACIDBASEDEF 9.0 (H) 09/16/2019 1922   O2SAT 96.0 09/16/2019 1922   CBG (last 3)  Recent  Labs    09/30/19 1630 09/30/19 2126 10/01/19 0551  GLUCAP 129* 122* 129*    Assessment/Plan: S/P Procedure(s) (LRB): CORONARY ARTERY BYPASS GRAFTING (CABG) using LIMA to LAD; Endoscopic right saphenous vein harvest to OM1, Diag1, and RCA. (N/A) TRANSESOPHAGEAL ECHOCARDIOGRAM (TEE) (N/A) INDOCYANINE GREEN FLUORESCENCE IMAGING (ICG) (N/A)  -POD 15 CABG. Making slow but progressive recovery. Stable cardiac rhythm. Near pre-op weight.  Mobility is improving.   -Atrial flutter- maintaining SR with few PAC's on amiodarone and metoprolol  -Expected acute blood loss anemia. Hct trending up. Continue Trinsicon.   -Acute renal insufficiency- creat 1.6 and trending down. Urine output adequate.   -Anticipate he will be ready for discharge to home with home health in next day or so. Will discuss with his family when they are available.     LOS: 19 days    Brandon Lewis [id number] 10/01/2019 Improving Plan home with nursing support and pt at home  I have seen and examined Brandon Lewis and agree with the above assessment  and plan.  Grace Isaac MD Beeper [phone] Office [phone] 10/01/2019 12:17 PM

## 2019-10-01 NOTE — Progress Notes (Signed)
Pt ambulated x 300 feet around unit with rolling walker, pt tolerated well

## 2019-10-01 NOTE — Progress Notes (Signed)
CARDIAC REHAB PHASE I   PRE:  Rate/Rhythm: 88 SR  BP:  Sitting: 142/72        SaO2: 98 RA  MODE:  Ambulation: 280 ft   POST:  Rate/Rhythm: 99 SR  BP:  Sitting: 130/70        SaO2: 91 RA  0940 - 1032  Helped pt to toilet and assisted pt in cleaning himself. Pt ambulated 280 ft with rolling walker and standby assist. Pt educated on IS usage, sternal precautions and restrictions, exercise, and diet ([place]). Pt in chair with call bell within reach.   Philis Kendall, MS, ACSM CEP 10/01/2019 10:29 AM

## 2019-10-02 LAB — GLUCOSE, CAPILLARY
Glucose-Capillary: 108 mg/dL — ABNORMAL HIGH (ref 70–99)
Glucose-Capillary: 156 mg/dL — ABNORMAL HIGH (ref 70–99)

## 2019-10-02 MED ORDER — TRAMADOL HCL 50 MG PO TABS: 50.0000 mg | ORAL_TABLET | Freq: Four times a day (QID) | ORAL | 0 refills | Status: AC | PRN

## 2019-10-02 MED ORDER — ASPIRIN 81 MG PO CHEW: 81.0000 mg | CHEWABLE_TABLET | Freq: Every day | ORAL | Status: DC

## 2019-10-02 MED ORDER — POTASSIUM CHLORIDE CRYS ER 20 MEQ PO TBCR: 20.0000 meq | EXTENDED_RELEASE_TABLET | Freq: Every day | ORAL | 0 refills | Status: DC

## 2019-10-02 MED ORDER — AMIODARONE HCL 200 MG PO TABS: 200.0000 mg | ORAL_TABLET | Freq: Two times a day (BID) | ORAL | 1 refills | Status: DC

## 2019-10-02 MED ORDER — TAMSULOSIN HCL 0.4 MG PO CAPS: 0.4000 mg | ORAL_CAPSULE | Freq: Every day | ORAL | 2 refills | Status: DC

## 2019-10-02 MED ORDER — FUROSEMIDE 40 MG PO TABS: 40.0000 mg | ORAL_TABLET | Freq: Every day | ORAL | 0 refills | Status: DC

## 2019-10-02 NOTE — Progress Notes (Addendum)
       [address]       [place] [address]             [phone]         Procedure(s) (LRB): CORONARY ARTERY BYPASS GRAFTING (CABG) using LIMA to LAD; Endoscopic right saphenous vein harvest to OM1, Diag1, and RCA. (N/A) TRANSESOPHAGEAL ECHOCARDIOGRAM (TEE) (N/A) INDOCYANINE GREEN FLUORESCENCE IMAGING (ICG) (N/A) Subjective: No new problems. Walked 340ft in the hall this am.   Objective: Vital signs in last 24 hours: Temp:  [97.9 F (36.6 C)-98.9 F (37.2 C)] 97.9 F (36.6 C) (12/13 0447) Pulse Rate:  [77-94] 94 (12/13 0823) Cardiac Rhythm: Normal sinus rhythm (12/13 0900) Resp:  [13-23] 20 (12/13 0447) BP: (114-133)/(52-97) 120/68 (12/13 0823) SpO2:  [95 %-100 %] 98 % (12/13 0721) FiO2 (%):  [21 %] 21 % (12/12 1455) Weight:  [90.2 kg] 90.2 kg (12/13 0500)     Intake/Output from previous day: 12/12 0701 - 12/13 0700 In: 250 [P.O.:250] Out: 925 [Urine:925] Intake/Output this shift: Total I/O In: -  Out: 150 [Urine:150]  Physical Exam:  General appearance: alert, cooperative and mild distress Neurologic: intact Heart: regular rate and rhythm and Monitor shows stable SR.  Lungs: Breath sound are clear. Extremities: 1+ LE edema. Wound: the sternal incision is intact and dry, covered with Dermabond.   Lab Results: Recent Labs    10/01/19 0327  WBC 9.7  HGB 7.5*  HCT 25.3*  PLT 174   BMET:  Recent Labs    10/01/19 0327  NA 139  K 4.7  CL 110  CO2 21*  GLUCOSE 128*  BUN 30*  CREATININE 1.61*  CALCIUM 8.9    PT/INR: No results for input(s): LABPROT, INR in the last 72 hours. ABG    Component Value Date/Time   PHART 7.313 (L) 09/16/2019 1922   HCO3 16.2 (L) 09/16/2019 1922   TCO2 17 (L) 09/16/2019 1922   ACIDBASEDEF 9.0 (H) 09/16/2019 1922   O2SAT 96.0 09/16/2019 1922   CBG (last 3)  Recent Labs    10/01/19 1625 10/01/19 2119 10/02/19 0614  GLUCAP 130* 189* 108*    Assessment/Plan: S/P Procedure(s)  (LRB): CORONARY ARTERY BYPASS GRAFTING (CABG) using LIMA to LAD; Endoscopic right saphenous vein harvest to OM1, Diag1, and RCA. (N/A) TRANSESOPHAGEAL ECHOCARDIOGRAM (TEE) (N/A) INDOCYANINE GREEN FLUORESCENCE IMAGING (ICG) (N/A)  -POD 16 CABG. Making slow but progressive recovery. Stable cardiac rhythm. Near pre-op weight.  Mobility is satisfactory. O2 sats acceptable on RA. CXR stable with small right effusion, bibasilar ATX.    -Atrial flutter- maintaining SR with few PAC's on amiodarone and metoprolol  -Expected acute blood loss anemia. Hct has been trending up. No new lab today.   -Acute renal insufficiency- creat 1.6 and trending down. Urine output adequate.   -Plan for discharge to home with home health and home PT today.     LOS: 20 days    Antony Odea, [place] [phone] 10/02/2019  Renal function returned to base line Home today  With home nursing /pt I have seen and examined Brandon Lewis and agree with the above assessment  and plan.  Grace Isaac MD [facility] [phone] Office [phone] 10/02/2019 11:05 AM

## 2019-10-02 NOTE — Progress Notes (Signed)
Pt ambulated x 300 feet in hall with rolling walker, pt tolerated well

## 2019-10-02 NOTE — Progress Notes (Signed)
PICC line removed per order and line is intact. Vaseline gauze and gauze dressing applied and pressure held. Site clean, dry, and intact. Patient and RN aware that patient is on bedrest for 30 minutes. Patient aware to leave dressing on and dry for 24 hours.  

## 2019-10-03 ENCOUNTER — Other Ambulatory Visit: Payer: Self-pay | Admitting: Cardiothoracic Surgery

## 2019-10-03 ENCOUNTER — Ambulatory Visit: Payer: Self-pay | Admitting: Cardiothoracic Surgery

## 2019-10-03 ENCOUNTER — Telehealth: Payer: Self-pay

## 2019-10-03 ENCOUNTER — Telehealth: Payer: Self-pay | Admitting: Internal Medicine

## 2019-10-03 DIAGNOSIS — Z951 Presence of aortocoronary bypass graft: Secondary | ICD-10-CM

## 2019-10-03 NOTE — Telephone Encounter (Signed)
Okay That is fine 

## 2019-10-03 NOTE — Telephone Encounter (Signed)
  Will be a delay start of care requested by pt son.   Brandon Lewis will try to go out tomorrow

## 2019-10-03 NOTE — Telephone Encounter (Signed)
Transition Care Management Follow-up Telephone Call  Date of discharge and from where: 10/12/2019, Brandon Lewis   How have you been since you were released from the hospital? Spoke with patient's son, [name] (with verbal consent from patient) and he stated that patient is doing good. He is focusing on recovering and getting around pretty well on his own.   Any questions or concerns? No   Items Reviewed:  Did the pt receive and understand the discharge instructions provided? Yes   Medications obtained and verified? Yes   Any new allergies since your discharge? No   Dietary orders reviewed? Yes  Do you have support at home? Yes   Functional Questionnaire: (I = Independent and D = Dependent) ADLs: I  Bathing/Dressing- I  Meal Prep- D  Eating- I  Maintaining continence- I  Transferring/Ambulation- I  Managing Meds- I  Follow up appointments reviewed:   PCP Hospital f/u appt confirmed? Yes  Scheduled to see Dr. Silvio Pate on 10/12/2019 @ 11:30 am.  [facility] f/u appt confirmed? following up with cardiology next week   Are transportation arrangements needed? No   If their condition worsens, is the pt aware to call PCP or go to the Emergency Dept.? Yes  Was the patient provided with contact information for the PCP's office or ED? Yes  Was to pt encouraged to call back with questions or concerns? Yes

## 2019-10-04 ENCOUNTER — Telehealth: Payer: Self-pay | Admitting: Internal Medicine

## 2019-10-04 NOTE — Telephone Encounter (Signed)
[name],[facility], called.  Brandon Lewis was suppose to see patient today, but patient's son requested [name] wait until tomorrow to come.

## 2019-10-04 NOTE — Telephone Encounter (Signed)
The patient's son Brandon Lewis called. Chevis Pretty had bypass surgery two weeks ago. He came home Sunday. Brandon Lewis was informed that there was covid exposure in the home. The patient was never in contact with the person that tested positive for covid. However, the patient's spouse was around the person that tested positive. Mr & Mrs. Munshi are not having any symptoms. Brandon Lewis would like to advise on how to move forward. If they both need to be tested. Please advise.

## 2019-10-05 NOTE — Telephone Encounter (Signed)
That is fine 

## 2019-10-05 NOTE — Telephone Encounter (Signed)
They probably should both be tested but depending on when the exposure was, it may be too soon (or give false negative results). They should stay as distanced as possible and wear masks even in the house---to help prevent transmission. If his wife gets any symptoms, presume COVID and have her stay quarantined (like in a different part of the house)

## 2019-10-05 NOTE — Telephone Encounter (Signed)
Spoke to pt's son, [name]. He will maybe wait a day or 2 to have him tested. Gave him the information on how to make an appointment with [facility].

## 2019-10-07 ENCOUNTER — Telehealth: Payer: Self-pay | Admitting: Internal Medicine

## 2019-10-07 NOTE — Telephone Encounter (Signed)
[name] @ [facility] called to get verbal orders  For PT 1 week 1 2 week 2 1 week 2

## 2019-10-07 NOTE — Telephone Encounter (Signed)
okay

## 2019-10-07 NOTE — Telephone Encounter (Signed)
Verbal orders left on VM for [facility]

## 2019-10-10 ENCOUNTER — Ambulatory Visit
Admission: RE | Admit: 2019-10-10 | Discharge: 2019-10-10 | Disposition: A | Discharge status: Home / Self Care | Payer: Medicare Other | Source: Ambulatory Visit | Attending: Cardiothoracic Surgery | Admitting: Cardiothoracic Surgery

## 2019-10-10 ENCOUNTER — Ambulatory Visit (INDEPENDENT_AMBULATORY_CARE_PROVIDER_SITE_OTHER): Payer: Self-pay | Admitting: Cardiothoracic Surgery

## 2019-10-10 ENCOUNTER — Other Ambulatory Visit: Payer: Self-pay

## 2019-10-10 VITALS — BP 129/74 | HR 87 | Temp 97.7°F | Resp 20 | Ht 70.0 in | Wt 188.0 lb

## 2019-10-10 DIAGNOSIS — Z951 Presence of aortocoronary bypass graft: Secondary | ICD-10-CM

## 2019-10-10 DIAGNOSIS — I2510 Atherosclerotic heart disease of native coronary artery without angina pectoris: Secondary | ICD-10-CM

## 2019-10-10 MED ORDER — MOMETASONE FURO-FORMOTEROL FUM 100-5 MCG/ACT IN AERO: 2.0000 | INHALATION_SPRAY | Freq: Two times a day (BID) | RESPIRATORY_TRACT | Status: DC

## 2019-10-11 NOTE — Progress Notes (Signed)
[address]       [place] [address]             [phone]     CARDIOTHORACIC SURGERY OFFICE NOTE  Referring Provider is Corey Skains, MD Primary Cardiologist is Corey Skains, MD PCP is Venia Carbon, MD   HPI:  85 yo man underwent cabg [id number] which was complicated by colonic ileus as an inpatient. He was a little slow to progress with PT, but he was ultimately discharged to home. He reports that he gets a little winded with exertion, but he denies chest pain.  Appetite is spotty, but no weight loss or concern over failure to thrive.   Current Outpatient Medications  Medication Sig Dispense Refill  . amiodarone (PACERONE) 200 MG tablet Take 1 tablet (200 mg total) by mouth 2 (two) times daily. Take one tablet by mouth TWICE daily for 7 days  then take one tablet by mouth ONCE daily. 60 tablet 1  . aspirin 81 MG chewable tablet Chew 1 tablet (81 mg total) by mouth daily.    . cholecalciferol (VITAMIN D) 1000 UNITS tablet Take 1,000 Units by mouth at bedtime.     . clopidogrel (PLAVIX) 75 MG tablet TAKE ONE TABLET BY MOUTH EVERY DAY *BOTTLE* (Patient taking differently: Take 75 mg by mouth every morning. ) 90 tablet 3  . Magnesium 400 MG CAPS Take by mouth daily.    . metoprolol succinate (TOPROL-XL) 50 MG 24 hr tablet **VIAL ONLY** TAKE (1) TABLET BY MOUTH ONCE A DAY.DO NOT CRUSH. (HIGHBLOOD PRESSURE) *BOTTLE* (Patient taking differently: Take 50 mg by mouth every morning. For high blood pressure) 30 tablet 11  . Multiple Vitamin (MULTIVITAMIN WITH MINERALS) TABS tablet Take 1 tablet by mouth every morning.    . rosuvastatin (CRESTOR) 10 MG tablet **VIAL ONLY** TAKE ONE TABLET BY MOUTH AT BEDTIME. (IMPROVES CHOLESTEROL) (Patient taking differently: Take 10 mg by mouth every morning. ) 90 tablet 3  . sodium chloride (BRONCHO SALINE) inhaler solution Take 1 spray by nebulization daily as needed (congestion).    . tamsulosin (FLOMAX) 0.4 MG CAPS  capsule Take 1 capsule (0.4 mg total) by mouth daily. 30 capsule 2  . furosemide (LASIX) 40 MG tablet Take 1 tablet (40 mg total) by mouth daily for 7 days. 7 tablet 0  . losartan (COZAAR) 100 MG tablet TAKE ONE TABLET BY MOUTH EACH MORNING FOR BLOOD PRESSURE CONTROL. *BOTTLE* *BOTTLE* (Patient not taking: For blood pressure control) 30 tablet 11   Current Facility-Administered Medications  Medication Dose Route Frequency Provider Last Rate Last Admin  . mometasone-formoterol (DULERA) 100-5 MCG/ACT inhaler 2 puff  2 puff Inhalation BID Wonda Olds, MD          Physical Exam:   BP 129/74   Pulse 87   Temp 97.7 F (36.5 C) (Skin)   Resp 20   Ht 5\' 10"  (1.778 m)   Wt 85.3 kg   SpO2 97% Comment: RA  BMI 26.98 kg/m   General:  Well-appearing, NAD  Chest:   cta  CV:   rrr  Incisions:  Well-healed  Abdomen:  Very mildly distended  Extremities:  No edema  Diagnostic Tests:  CXR with clear lung fields   Impression: Doing well after CABG;   Plan:  F/u as needed with CT surgery Continue physical therapy F/u with Dr. [name] No change to meds at present  I spent in excess of 20 minutes during  the conduct of this office consultation and >50% of this time involved direct face-to-face encounter with the patient for counseling and/or coordination of their care.  Level 2                 10 minutes Level 3                 15 minutes Level 4                 25 minutes Level 5                 40 minutes  B. Murvin Natal, MD 10/11/2019 11:04 AM

## 2019-10-12 ENCOUNTER — Ambulatory Visit (INDEPENDENT_AMBULATORY_CARE_PROVIDER_SITE_OTHER): Payer: Medicare Other | Admitting: Internal Medicine

## 2019-10-12 ENCOUNTER — Encounter: Payer: Self-pay | Admitting: Internal Medicine

## 2019-10-12 ENCOUNTER — Other Ambulatory Visit: Payer: Self-pay

## 2019-10-12 VITALS — BP 92/58 | HR 91 | Temp 97.0°F | Ht 70.0 in | Wt 189.0 lb

## 2019-10-12 DIAGNOSIS — R2689 Other abnormalities of gait and mobility: Secondary | ICD-10-CM

## 2019-10-12 DIAGNOSIS — I483 Typical atrial flutter: Secondary | ICD-10-CM

## 2019-10-12 DIAGNOSIS — I25110 Atherosclerotic heart disease of native coronary artery with unstable angina pectoris: Secondary | ICD-10-CM | POA: Diagnosis not present

## 2019-10-12 DIAGNOSIS — Z7902 Long term (current) use of antithrombotics/antiplatelets: Secondary | ICD-10-CM

## 2019-10-12 DIAGNOSIS — I2510 Atherosclerotic heart disease of native coronary artery without angina pectoris: Secondary | ICD-10-CM | POA: Diagnosis not present

## 2019-10-12 DIAGNOSIS — I214 Non-ST elevation (NSTEMI) myocardial infarction: Secondary | ICD-10-CM | POA: Diagnosis not present

## 2019-10-12 DIAGNOSIS — N183 Chronic kidney disease, stage 3 unspecified: Secondary | ICD-10-CM | POA: Diagnosis not present

## 2019-10-12 DIAGNOSIS — Z48812 Encounter for surgical aftercare following surgery on the circulatory system: Secondary | ICD-10-CM | POA: Diagnosis not present

## 2019-10-12 DIAGNOSIS — Z87891 Personal history of nicotine dependence: Secondary | ICD-10-CM

## 2019-10-12 DIAGNOSIS — I4892 Unspecified atrial flutter: Secondary | ICD-10-CM

## 2019-10-12 DIAGNOSIS — Z23 Encounter for immunization: Secondary | ICD-10-CM | POA: Diagnosis not present

## 2019-10-12 DIAGNOSIS — M5431 Sciatica, right side: Secondary | ICD-10-CM

## 2019-10-12 DIAGNOSIS — Z951 Presence of aortocoronary bypass graft: Secondary | ICD-10-CM

## 2019-10-12 DIAGNOSIS — I129 Hypertensive chronic kidney disease with stage 1 through stage 4 chronic kidney disease, or unspecified chronic kidney disease: Secondary | ICD-10-CM | POA: Diagnosis not present

## 2019-10-12 DIAGNOSIS — I10 Essential (primary) hypertension: Secondary | ICD-10-CM

## 2019-10-12 DIAGNOSIS — M150 Primary generalized (osteo)arthritis: Secondary | ICD-10-CM

## 2019-10-12 DIAGNOSIS — M6281 Muscle weakness (generalized): Secondary | ICD-10-CM

## 2019-10-12 LAB — CBC
HCT: 29.2 % — ABNORMAL LOW (ref 39.0–52.0)
Hemoglobin: 9.3 g/dL — ABNORMAL LOW (ref 13.0–17.0)
MCHC: 31.9 g/dL (ref 30.0–36.0)
MCV: 104.1 fl — ABNORMAL HIGH (ref 78.0–100.0)
Platelets: 262 10*3/uL (ref 150.0–400.0)
RBC: 2.8 Mil/uL — ABNORMAL LOW (ref 4.22–5.81)
RDW: 23.8 % — ABNORMAL HIGH (ref 11.5–15.5)
WBC: 9.9 10*3/uL (ref 4.0–10.5)

## 2019-10-12 LAB — RENAL FUNCTION PANEL
Albumin: 3.4 g/dL — ABNORMAL LOW (ref 3.5–5.2)
BUN: 37 mg/dL — ABNORMAL HIGH (ref 6–23)
CO2: 23 mEq/L (ref 19–32)
Calcium: 9.7 mg/dL (ref 8.4–10.5)
Chloride: 103 mEq/L (ref 96–112)
Creatinine, Ser: 1.86 mg/dL — ABNORMAL HIGH (ref 0.40–1.50)
GFR: 34.83 mL/min — ABNORMAL LOW (ref >60.00)
Glucose, Bld: 133 mg/dL — ABNORMAL HIGH (ref 70–99)
Phosphorus: 3.6 mg/dL (ref 2.3–4.6)
Potassium: 4.5 mEq/L (ref 3.5–5.1)
Sodium: 135 mEq/L (ref 135–145)

## 2019-10-12 LAB — T4, FREE: Free T4: 1.39 ng/dL (ref 0.60–1.60)

## 2019-10-12 NOTE — Progress Notes (Signed)
Subjective:    Patient ID: Brandon Lewis, male    DOB: [birth year], 85 y.o.   MRN: [id number]  HPI  Here for hospital follow up with son--Hugh  This visit occurred during the SARS-CoV-2 public health emergency.  Safety protocols were in place, including screening questions prior to the visit, additional usage of staff PPE, and extensive cleaning of exam room while observing appropriate contact time as indicated for disinfecting solutions.   Woke with chest pain--called 9 Took old nitroglycerin and some aspirin To ER---MI diagnosed per enzymes and was admitted cathed the next day----- multivessel disease Transferred to [facility]---- CABG several days later  Diagnosed with atrial flutter Now on amiodarone Never had clear cut symptoms  Hospitalization prolonged due to ileus Had NG tube for a while---finally opened up Started oral intake and slow improvement  No chest pain other than from incision--still very sore along left sternal border Gets a "feeling there" every once in a while Gets DOE--- walking across house. Resolves quickly. Seems to be improving Had dulera in the hospital--has at home (but only using occasionally) No edema in left leg--just in the right from vein harvest No palpitations since home Occasional brief dizziness  Current Outpatient Medications on File Prior to Visit  Medication Sig Dispense Refill  . amiodarone (PACERONE) 200 MG tablet Take 1 tablet (200 mg total) by mouth 2 (two) times daily. Take one tablet by mouth TWICE daily for 7 days  then take one tablet by mouth ONCE daily. 60 tablet 1  . aspirin 81 MG chewable tablet Chew 1 tablet (81 mg total) by mouth daily.    . cholecalciferol (VITAMIN D) 1000 UNITS tablet Take 1,000 Units by mouth at bedtime.     . clopidogrel (PLAVIX) 75 MG tablet TAKE ONE TABLET BY MOUTH EVERY DAY *BOTTLE* (Patient taking differently: Take 75 mg by mouth every morning. ) 90 tablet 3  . Magnesium 400 MG CAPS Take by mouth  daily.    . metoprolol succinate (TOPROL-XL) 50 MG 24 hr tablet **VIAL ONLY** TAKE (1) TABLET BY MOUTH ONCE A DAY.DO NOT CRUSH. (HIGHBLOOD PRESSURE) *BOTTLE* (Patient taking differently: Take 50 mg by mouth every morning. For high blood pressure) 30 tablet 11  . Multiple Vitamin (MULTIVITAMIN WITH MINERALS) TABS tablet Take 1 tablet by mouth every morning.    . rosuvastatin (CRESTOR) 10 MG tablet **VIAL ONLY** TAKE ONE TABLET BY MOUTH AT BEDTIME. (IMPROVES CHOLESTEROL) (Patient taking differently: Take 10 mg by mouth every morning. ) 90 tablet 3  . sodium chloride (BRONCHO SALINE) inhaler solution Take 1 spray by nebulization daily as needed (congestion).    . tamsulosin (FLOMAX) 0.4 MG CAPS capsule Take 1 capsule (0.4 mg total) by mouth daily. 30 capsule 2   Current Facility-Administered Medications on File Prior to Visit  Medication Dose Route Frequency Provider Last Rate Last Admin  . mometasone-formoterol (DULERA) 100-5 MCG/ACT inhaler 2 puff  2 puff Inhalation BID Wonda Olds, MD        Allergies  Allergen Reactions  . Cilostazol Other (See Comments)    REACTION: stomach problems- constipation  . Simvastatin Other (See Comments)    REACTION: myalgia    Past Medical History:  Diagnosis Date  . CAD (coronary artery disease)    s/p PCI after presenting with exertional angina (drug eluting stent)  . Diverticulosis   . GERD (gastroesophageal reflux disease)   . HTN (hypertension)   . Hyperlipidemia   . Osteoarthritis   . PVD (peripheral vascular  disease) ([place])    s/p aortobifemoral bypass, with bilateral SFA occlusion and intermittent claudication   . Small bowel obstruction [facility])     Past Surgical History:  Procedure Laterality Date  . aorto-bifem  2003   [facility]   . CAROTID STENT     [place]  . CATARACT EXTRACTION, BILATERAL  2017  . CHOLECYSTECTOMY  2002  . CORONARY ARTERY BYPASS GRAFT N/A 09/16/2019   Procedure: CORONARY ARTERY BYPASS GRAFTING (CABG) using LIMA to  LAD; Endoscopic right saphenous vein harvest to OM1, Diag1, and RCA.;  Surgeon: Wonda Olds, MD;  Location: [facility];  Service: Open Heart Surgery;  Laterality: N/A;  . LEFT HEART CATH AND CORONARY ANGIOGRAPHY N/A 09/12/2019   Procedure: LEFT HEART CATH AND CORONARY ANGIOGRAPHY;  Surgeon: Corey Skains, MD;  Location: [facility];  Service: Cardiovascular;  Laterality: N/A;  . TEE WITHOUT CARDIOVERSION N/A 09/16/2019   Procedure: TRANSESOPHAGEAL ECHOCARDIOGRAM (TEE);  Surgeon: Wonda Olds, MD;  Location: [facility];  Service: Open Heart Surgery;  Laterality: N/A;  . TONSILLECTOMY    . WISDOM TOOTH EXTRACTION      Family History  Problem Relation Age of Onset  . Diabetes Father        DM - and brother   . Stroke Father   . Coronary artery disease Paternal Grandfather   . Colon cancer Neg Hx   . Prostate cancer Neg Hx     Social History   Socioeconomic History  . Marital status: Married    Spouse name: Not on file  . Number of children: 3  . Years of education: Not on file  . Highest education level: Not on file  Occupational History  . Occupation: Landscape architect    Comment: Retired  Tobacco Use  . Smoking status: Former Smoker    Types: Cigarettes    Quit date: 10/20/1992    Years since quitting: 26.9  . Smokeless tobacco: Never Used  . Tobacco comment: quit in 1994   Substance and Sexual Activity  . Alcohol use: Not Currently    Comment: rare  . Drug use: No  . Sexual activity: Not on file  Other Topics Concern  . Not on file  Social History Narrative   Widowed; then remarried; 3 sons.      Has a living will - wife has health care POA    Son [name] would be alternate   Would want resuscitation attempts   Would accept brief trial of artificial nutrition      Pt signed designated party release form and gives Damante Trewin [phone] (home #), access to medical records. Can also leave msg on home answering machine. Cell # [phone]   Social  Determinants of Health   Financial Resource Strain:   . Difficulty of Paying Living Expenses: Not on file  Food Insecurity:   . Worried About Charity fundraiser in the Last Year: Not on file  . Ran Out of Food in the Last Year: Not on file  Transportation Needs:   . Lack of Transportation (Medical): Not on file  . Lack of Transportation (Non-Medical): Not on file  Physical Activity:   . Days of Exercise per Week: Not on file  . Minutes of Exercise per Session: Not on file  Stress:   . Feeling of Stress : Not on file  Social Connections:   . Frequency of Communication with Friends and Family: Not on file  . Frequency of Social Gatherings with  Friends and Family: Not on file  . Attends Religious Services: Not on file  . Active Member of Clubs or Organizations: Not on file  . Attends Archivist Meetings: Not on file  . Marital Status: Not on file  Intimate Partner Violence:   . Fear of Current or Ex-Partner: Not on file  . Emotionally Abused: Not on file  . Physically Abused: Not on file  . Sexually Abused: Not on file   Review of Systems  Sleeping okay but waking 2-3AM and trouble getting back Not depressed Appetite is not great yet--can't eat much yet Weight is down about 10# Having some sediment in urine--no blood    Objective:   Physical Exam  Constitutional: He appears well-developed. No distress.  Neck: No thyromegaly present.  Cardiovascular: Normal rate, regular rhythm and normal heart sounds. Exam reveals no gallop.  No murmur heard. Respiratory: Effort normal. No respiratory distress. He has no wheezes. He has no rales.  Slight dullness and decreased breath sounds just at low right base Sternal wound well healed--no inflammation  Musculoskeletal:        General: No edema.  Lymphadenopathy:    He has no cervical adenopathy.  Neurological:  Walks with walker stably Able to get up on table           Assessment & Plan:

## 2019-10-12 NOTE — Assessment & Plan Note (Signed)
BP Readings from Last 3 Encounters:  10/12/19 (!) 92/58  10/10/19 129/74  10/02/19 107/61   Low now Off losartan for now On beta blocker

## 2019-10-12 NOTE — Addendum Note (Signed)
Addended by: Pilar Grammes on: 10/12/2019 12:13 PM   Modules accepted: Orders

## 2019-10-12 NOTE — Assessment & Plan Note (Signed)
On amiodarone now

## 2019-10-12 NOTE — Assessment & Plan Note (Signed)
Recent CABG Complicated course but doing well now

## 2019-10-12 NOTE — Assessment & Plan Note (Signed)
Will recheck

## 2019-10-19 ENCOUNTER — Telehealth: Payer: Self-pay | Admitting: *Deleted

## 2019-10-19 NOTE — Telephone Encounter (Signed)
Dorian Pod with [facility] left a voicemail wanting to let Dr. Silvio Pate know that the patient has requested no nurse visit this week because he is too busy running errands and with the holiday. Dorian Pod stated that they will pick up the visits next week. If any questions you can call back.

## 2019-10-20 NOTE — Telephone Encounter (Signed)
That is fine 

## 2019-10-31 DIAGNOSIS — M6281 Muscle weakness (generalized): Secondary | ICD-10-CM | POA: Diagnosis not present

## 2019-10-31 DIAGNOSIS — I2510 Atherosclerotic heart disease of native coronary artery without angina pectoris: Secondary | ICD-10-CM | POA: Diagnosis not present

## 2019-10-31 DIAGNOSIS — N183 Chronic kidney disease, stage 3 unspecified: Secondary | ICD-10-CM | POA: Diagnosis not present

## 2019-10-31 DIAGNOSIS — I4892 Unspecified atrial flutter: Secondary | ICD-10-CM | POA: Diagnosis not present

## 2019-10-31 DIAGNOSIS — M5431 Sciatica, right side: Secondary | ICD-10-CM | POA: Diagnosis not present

## 2019-10-31 DIAGNOSIS — I129 Hypertensive chronic kidney disease with stage 1 through stage 4 chronic kidney disease, or unspecified chronic kidney disease: Secondary | ICD-10-CM | POA: Diagnosis not present

## 2019-10-31 DIAGNOSIS — I10 Essential (primary) hypertension: Secondary | ICD-10-CM | POA: Diagnosis not present

## 2019-10-31 DIAGNOSIS — I214 Non-ST elevation (NSTEMI) myocardial infarction: Secondary | ICD-10-CM | POA: Diagnosis not present

## 2019-10-31 DIAGNOSIS — R2689 Other abnormalities of gait and mobility: Secondary | ICD-10-CM | POA: Diagnosis not present

## 2019-11-04 DIAGNOSIS — I739 Peripheral vascular disease, unspecified: Secondary | ICD-10-CM | POA: Diagnosis not present

## 2019-11-04 DIAGNOSIS — I214 Non-ST elevation (NSTEMI) myocardial infarction: Secondary | ICD-10-CM | POA: Diagnosis not present

## 2019-11-04 DIAGNOSIS — E782 Mixed hyperlipidemia: Secondary | ICD-10-CM | POA: Diagnosis not present

## 2019-11-04 DIAGNOSIS — Z951 Presence of aortocoronary bypass graft: Secondary | ICD-10-CM | POA: Diagnosis not present

## 2019-11-04 DIAGNOSIS — I10 Essential (primary) hypertension: Secondary | ICD-10-CM | POA: Diagnosis not present

## 2019-11-04 DIAGNOSIS — I25119 Atherosclerotic heart disease of native coronary artery with unspecified angina pectoris: Secondary | ICD-10-CM | POA: Diagnosis not present

## 2019-11-04 DIAGNOSIS — I4892 Unspecified atrial flutter: Secondary | ICD-10-CM | POA: Diagnosis not present

## 2019-11-04 DIAGNOSIS — R0600 Dyspnea, unspecified: Secondary | ICD-10-CM | POA: Diagnosis not present

## 2019-11-06 DIAGNOSIS — I4892 Unspecified atrial flutter: Secondary | ICD-10-CM | POA: Diagnosis not present

## 2019-11-06 DIAGNOSIS — M5431 Sciatica, right side: Secondary | ICD-10-CM | POA: Diagnosis not present

## 2019-11-06 DIAGNOSIS — M6281 Muscle weakness (generalized): Secondary | ICD-10-CM | POA: Diagnosis not present

## 2019-11-06 DIAGNOSIS — N183 Chronic kidney disease, stage 3 unspecified: Secondary | ICD-10-CM | POA: Diagnosis not present

## 2019-11-06 DIAGNOSIS — I2510 Atherosclerotic heart disease of native coronary artery without angina pectoris: Secondary | ICD-10-CM | POA: Diagnosis not present

## 2019-11-06 DIAGNOSIS — I214 Non-ST elevation (NSTEMI) myocardial infarction: Secondary | ICD-10-CM | POA: Diagnosis not present

## 2019-11-06 DIAGNOSIS — I10 Essential (primary) hypertension: Secondary | ICD-10-CM | POA: Diagnosis not present

## 2019-11-06 DIAGNOSIS — I129 Hypertensive chronic kidney disease with stage 1 through stage 4 chronic kidney disease, or unspecified chronic kidney disease: Secondary | ICD-10-CM | POA: Diagnosis not present

## 2019-11-06 DIAGNOSIS — R2689 Other abnormalities of gait and mobility: Secondary | ICD-10-CM | POA: Diagnosis not present

## 2019-11-09 DIAGNOSIS — M6281 Muscle weakness (generalized): Secondary | ICD-10-CM | POA: Diagnosis not present

## 2019-11-09 DIAGNOSIS — I10 Essential (primary) hypertension: Secondary | ICD-10-CM | POA: Diagnosis not present

## 2019-11-09 DIAGNOSIS — M5431 Sciatica, right side: Secondary | ICD-10-CM | POA: Diagnosis not present

## 2019-11-09 DIAGNOSIS — I4892 Unspecified atrial flutter: Secondary | ICD-10-CM | POA: Diagnosis not present

## 2019-11-09 DIAGNOSIS — I214 Non-ST elevation (NSTEMI) myocardial infarction: Secondary | ICD-10-CM | POA: Diagnosis not present

## 2019-11-09 DIAGNOSIS — I2510 Atherosclerotic heart disease of native coronary artery without angina pectoris: Secondary | ICD-10-CM | POA: Diagnosis not present

## 2019-11-09 DIAGNOSIS — N183 Chronic kidney disease, stage 3 unspecified: Secondary | ICD-10-CM | POA: Diagnosis not present

## 2019-11-09 DIAGNOSIS — R2689 Other abnormalities of gait and mobility: Secondary | ICD-10-CM | POA: Diagnosis not present

## 2019-11-09 DIAGNOSIS — I129 Hypertensive chronic kidney disease with stage 1 through stage 4 chronic kidney disease, or unspecified chronic kidney disease: Secondary | ICD-10-CM | POA: Diagnosis not present

## 2019-11-10 DIAGNOSIS — R2689 Other abnormalities of gait and mobility: Secondary | ICD-10-CM | POA: Diagnosis not present

## 2019-11-10 DIAGNOSIS — N183 Chronic kidney disease, stage 3 unspecified: Secondary | ICD-10-CM | POA: Diagnosis not present

## 2019-11-10 DIAGNOSIS — M5431 Sciatica, right side: Secondary | ICD-10-CM | POA: Diagnosis not present

## 2019-11-10 DIAGNOSIS — M6281 Muscle weakness (generalized): Secondary | ICD-10-CM | POA: Diagnosis not present

## 2019-11-10 DIAGNOSIS — I129 Hypertensive chronic kidney disease with stage 1 through stage 4 chronic kidney disease, or unspecified chronic kidney disease: Secondary | ICD-10-CM | POA: Diagnosis not present

## 2019-11-10 DIAGNOSIS — I10 Essential (primary) hypertension: Secondary | ICD-10-CM | POA: Diagnosis not present

## 2019-11-10 DIAGNOSIS — I2510 Atherosclerotic heart disease of native coronary artery without angina pectoris: Secondary | ICD-10-CM | POA: Diagnosis not present

## 2019-11-10 DIAGNOSIS — I4892 Unspecified atrial flutter: Secondary | ICD-10-CM | POA: Diagnosis not present

## 2019-11-10 DIAGNOSIS — I214 Non-ST elevation (NSTEMI) myocardial infarction: Secondary | ICD-10-CM | POA: Diagnosis not present

## 2019-11-11 DIAGNOSIS — N183 Chronic kidney disease, stage 3 unspecified: Secondary | ICD-10-CM | POA: Diagnosis not present

## 2019-11-11 DIAGNOSIS — I129 Hypertensive chronic kidney disease with stage 1 through stage 4 chronic kidney disease, or unspecified chronic kidney disease: Secondary | ICD-10-CM | POA: Diagnosis not present

## 2019-11-11 DIAGNOSIS — R2689 Other abnormalities of gait and mobility: Secondary | ICD-10-CM | POA: Diagnosis not present

## 2019-11-11 DIAGNOSIS — I4892 Unspecified atrial flutter: Secondary | ICD-10-CM | POA: Diagnosis not present

## 2019-11-11 DIAGNOSIS — I10 Essential (primary) hypertension: Secondary | ICD-10-CM | POA: Diagnosis not present

## 2019-11-11 DIAGNOSIS — I214 Non-ST elevation (NSTEMI) myocardial infarction: Secondary | ICD-10-CM | POA: Diagnosis not present

## 2019-11-11 DIAGNOSIS — M6281 Muscle weakness (generalized): Secondary | ICD-10-CM | POA: Diagnosis not present

## 2019-11-11 DIAGNOSIS — I2510 Atherosclerotic heart disease of native coronary artery without angina pectoris: Secondary | ICD-10-CM | POA: Diagnosis not present

## 2019-11-11 DIAGNOSIS — M5431 Sciatica, right side: Secondary | ICD-10-CM | POA: Diagnosis not present

## 2019-11-13 DIAGNOSIS — I214 Non-ST elevation (NSTEMI) myocardial infarction: Secondary | ICD-10-CM | POA: Diagnosis not present

## 2019-11-13 DIAGNOSIS — M5431 Sciatica, right side: Secondary | ICD-10-CM | POA: Diagnosis not present

## 2019-11-13 DIAGNOSIS — I4892 Unspecified atrial flutter: Secondary | ICD-10-CM | POA: Diagnosis not present

## 2019-11-13 DIAGNOSIS — R2689 Other abnormalities of gait and mobility: Secondary | ICD-10-CM | POA: Diagnosis not present

## 2019-11-13 DIAGNOSIS — M6281 Muscle weakness (generalized): Secondary | ICD-10-CM | POA: Diagnosis not present

## 2019-11-13 DIAGNOSIS — I10 Essential (primary) hypertension: Secondary | ICD-10-CM | POA: Diagnosis not present

## 2019-11-13 DIAGNOSIS — I129 Hypertensive chronic kidney disease with stage 1 through stage 4 chronic kidney disease, or unspecified chronic kidney disease: Secondary | ICD-10-CM | POA: Diagnosis not present

## 2019-11-13 DIAGNOSIS — N183 Chronic kidney disease, stage 3 unspecified: Secondary | ICD-10-CM | POA: Diagnosis not present

## 2019-11-13 DIAGNOSIS — I2510 Atherosclerotic heart disease of native coronary artery without angina pectoris: Secondary | ICD-10-CM | POA: Diagnosis not present

## 2019-11-15 DIAGNOSIS — I10 Essential (primary) hypertension: Secondary | ICD-10-CM | POA: Diagnosis not present

## 2019-11-15 DIAGNOSIS — N183 Chronic kidney disease, stage 3 unspecified: Secondary | ICD-10-CM | POA: Diagnosis not present

## 2019-11-15 DIAGNOSIS — M5431 Sciatica, right side: Secondary | ICD-10-CM | POA: Diagnosis not present

## 2019-11-15 DIAGNOSIS — I214 Non-ST elevation (NSTEMI) myocardial infarction: Secondary | ICD-10-CM | POA: Diagnosis not present

## 2019-11-15 DIAGNOSIS — I4892 Unspecified atrial flutter: Secondary | ICD-10-CM | POA: Diagnosis not present

## 2019-11-15 DIAGNOSIS — I129 Hypertensive chronic kidney disease with stage 1 through stage 4 chronic kidney disease, or unspecified chronic kidney disease: Secondary | ICD-10-CM | POA: Diagnosis not present

## 2019-11-15 DIAGNOSIS — M6281 Muscle weakness (generalized): Secondary | ICD-10-CM | POA: Diagnosis not present

## 2019-11-15 DIAGNOSIS — I2510 Atherosclerotic heart disease of native coronary artery without angina pectoris: Secondary | ICD-10-CM | POA: Diagnosis not present

## 2019-11-15 DIAGNOSIS — R2689 Other abnormalities of gait and mobility: Secondary | ICD-10-CM | POA: Diagnosis not present

## 2019-11-16 ENCOUNTER — Other Ambulatory Visit: Payer: Self-pay

## 2019-11-16 ENCOUNTER — Encounter: Payer: Self-pay | Admitting: Internal Medicine

## 2019-11-16 ENCOUNTER — Ambulatory Visit: Payer: Medicare PPO | Admitting: Internal Medicine

## 2019-11-16 VITALS — BP 108/60 | HR 82 | Temp 97.2°F | Ht 70.0 in | Wt 193.0 lb

## 2019-11-16 DIAGNOSIS — N183 Chronic kidney disease, stage 3 unspecified: Secondary | ICD-10-CM

## 2019-11-16 DIAGNOSIS — N401 Enlarged prostate with lower urinary tract symptoms: Secondary | ICD-10-CM

## 2019-11-16 DIAGNOSIS — I2510 Atherosclerotic heart disease of native coronary artery without angina pectoris: Secondary | ICD-10-CM | POA: Diagnosis not present

## 2019-11-16 DIAGNOSIS — R351 Nocturia: Secondary | ICD-10-CM

## 2019-11-16 DIAGNOSIS — I4892 Unspecified atrial flutter: Secondary | ICD-10-CM | POA: Diagnosis not present

## 2019-11-16 DIAGNOSIS — N400 Enlarged prostate without lower urinary tract symptoms: Secondary | ICD-10-CM | POA: Insufficient documentation

## 2019-11-16 LAB — CBC
HCT: 31.1 % — ABNORMAL LOW (ref 39.0–52.0)
Hemoglobin: 9.9 g/dL — ABNORMAL LOW (ref 13.0–17.0)
MCHC: 31.6 g/dL (ref 30.0–36.0)
MCV: 110.5 fl — ABNORMAL HIGH (ref 78.0–100.0)
Platelets: 159 10*3/uL (ref 150.0–400.0)
RBC: 2.82 Mil/uL — ABNORMAL LOW (ref 4.22–5.81)
RDW: 26 % — ABNORMAL HIGH (ref 11.5–15.5)
WBC: 8.3 10*3/uL (ref 4.0–10.5)

## 2019-11-16 LAB — RENAL FUNCTION PANEL
Albumin: 3.7 g/dL (ref 3.5–5.2)
BUN: 29 mg/dL — ABNORMAL HIGH (ref 6–23)
CO2: 25 mEq/L (ref 19–32)
Calcium: 9.4 mg/dL (ref 8.4–10.5)
Chloride: 105 mEq/L (ref 96–112)
Creatinine, Ser: 1.61 mg/dL — ABNORMAL HIGH (ref 0.40–1.50)
GFR: 41.13 mL/min — ABNORMAL LOW (ref >60.00)
Glucose, Bld: 115 mg/dL — ABNORMAL HIGH (ref 70–99)
Phosphorus: 2.8 mg/dL (ref 2.3–4.6)
Potassium: 4.6 mEq/L (ref 3.5–5.1)
Sodium: 136 mEq/L (ref 135–145)

## 2019-11-16 LAB — T4, FREE: Free T4: 1.18 ng/dL (ref 0.60–1.60)

## 2019-11-16 NOTE — Assessment & Plan Note (Signed)
Post op--no clear recurrence Hopefully will not need amiodarone for much longer Will add thyroid function to today's blood work though

## 2019-11-16 NOTE — Progress Notes (Signed)
Subjective:    Patient ID: Brandon Lewis, male    DOB: 10-08-1934, 86 y.o.   MRN: [id number]  HPI Here for follow up after CABG This visit occurred during the SARS-CoV-2 public health emergency.  Safety protocols were in place, including screening questions prior to the visit, additional usage of staff PPE, and extensive cleaning of exam room while observing appropriate contact time as indicated for disinfecting solutions.   Still has nurse and PT at home OT has stopped Finishing with the nurse as well ---and close to discharge from PT Just started driving today--to come here Unsure about whether he will be doing cardiac rehab  Healing sternal wound No chest pain ---other than some pectoral pain on right Breathing is good---has stable quick DOE which is slowly improving No sig edema--just mild related to vein harvest on right  No palpitations Still on amiodarone for now--will reconsider at 3 month follow up  Current Outpatient Medications on File Prior to Visit  Medication Sig Dispense Refill  . amiodarone (PACERONE) 200 MG tablet Take 1 tablet (200 mg total) by mouth 2 (two) times daily. Take one tablet by mouth TWICE daily for 7 days  then take one tablet by mouth ONCE daily. 60 tablet 1  . aspirin 81 MG chewable tablet Chew 1 tablet (81 mg total) by mouth daily.    . cholecalciferol (VITAMIN D) 1000 UNITS tablet Take 1,000 Units by mouth at bedtime.     . clopidogrel (PLAVIX) 75 MG tablet TAKE ONE TABLET BY MOUTH EVERY DAY *BOTTLE* (Patient taking differently: Take 75 mg by mouth every morning. ) 90 tablet 3  . Magnesium 400 MG CAPS Take by mouth daily.    . metoprolol succinate (TOPROL-XL) 50 MG 24 hr tablet **VIAL ONLY** TAKE (1) TABLET BY MOUTH ONCE A DAY.DO NOT CRUSH. (HIGHBLOOD PRESSURE) *BOTTLE* (Patient taking differently: Take 50 mg by mouth every morning. For high blood pressure) 30 tablet 11  . Multiple Vitamin (MULTIVITAMIN WITH MINERALS) TABS tablet Take 1 tablet  by mouth every morning.    . rosuvastatin (CRESTOR) 10 MG tablet **VIAL ONLY** TAKE ONE TABLET BY MOUTH AT BEDTIME. (IMPROVES CHOLESTEROL) (Patient taking differently: Take 10 mg by mouth every morning. ) 90 tablet 3  . sodium chloride (BRONCHO SALINE) inhaler solution Take 1 spray by nebulization daily as needed (congestion).    . tamsulosin (FLOMAX) 0.4 MG CAPS capsule Take 1 capsule (0.4 mg total) by mouth daily. 30 capsule 2   Current Facility-Administered Medications on File Prior to Visit  Medication Dose Route Frequency Provider Last Rate Last Admin  . mometasone-formoterol (DULERA) 100-5 MCG/ACT inhaler 2 puff  2 puff Inhalation BID Wonda Olds, MD        Allergies  Allergen Reactions  . Cilostazol Other (See Comments)    REACTION: stomach problems- constipation  . Simvastatin Other (See Comments)    REACTION: myalgia    Past Medical History:  Diagnosis Date  . CAD (coronary artery disease)    s/p PCI after presenting with exertional angina (drug eluting stent)  . Diverticulosis   . GERD (gastroesophageal reflux disease)   . HTN (hypertension)   . Hyperlipidemia   . Osteoarthritis   . PVD (peripheral vascular disease) ([place])    s/p aortobifemoral bypass, with bilateral SFA occlusion and intermittent claudication   . Small bowel obstruction [facility])     Past Surgical History:  Procedure Laterality Date  . aorto-bifem  2003   [facility]   . CAROTID STENT  [place]  . CATARACT EXTRACTION, BILATERAL  2017  . CHOLECYSTECTOMY  2002  . CORONARY ARTERY BYPASS GRAFT N/A 09/16/2019   Procedure: CORONARY ARTERY BYPASS GRAFTING (CABG) using LIMA to LAD; Endoscopic right saphenous vein harvest to OM1, Diag1, and RCA.;  Surgeon: Wonda Olds, MD;  Location: [facility];  Service: Open Heart Surgery;  Laterality: N/A;  . LEFT HEART CATH AND CORONARY ANGIOGRAPHY N/A 09/12/2019   Procedure: LEFT HEART CATH AND CORONARY ANGIOGRAPHY;  Surgeon: Corey Skains, MD;  Location: [facility];  Service: Cardiovascular;  Laterality: N/A;  . TEE WITHOUT CARDIOVERSION N/A 09/16/2019   Procedure: TRANSESOPHAGEAL ECHOCARDIOGRAM (TEE);  Surgeon: Wonda Olds, MD;  Location: [facility];  Service: Open Heart Surgery;  Laterality: N/A;  . TONSILLECTOMY    . WISDOM TOOTH EXTRACTION      Family History  Problem Relation Age of Onset  . Diabetes Father        DM - and brother   . Stroke Father   . Coronary artery disease Paternal Grandfather   . Colon cancer Neg Hx   . Prostate cancer Neg Hx     Social History   Socioeconomic History  . Marital status: Married    Spouse name: Not on file  . Number of children: 3  . Years of education: Not on file  . Highest education level: Not on file  Occupational History  . Occupation: Landscape architect    Comment: Retired  Tobacco Use  . Smoking status: Former Smoker    Types: Cigarettes    Quit date: 10/20/1992    Years since quitting: 27.0  . Smokeless tobacco: Never Used  . Tobacco comment: quit in 1994   Substance and Sexual Activity  . Alcohol use: Not Currently    Comment: rare  . Drug use: No  . Sexual activity: Not on file  Other Topics Concern  . Not on file  Social History Narrative   Widowed; then remarried; 3 sons.      Has a living will - wife has health care POA    Son Brandon Lewis would be alternate   Would want resuscitation attempts   Would accept brief trial of artificial nutrition      Pt signed designated party release form and gives Brandon Lewis [phone] (home #), access to medical records. Can also leave msg on home answering machine. Cell # [phone]   Social Determinants of Health   Financial Resource Strain:   . Difficulty of Paying Living Expenses: Not on file  Food Insecurity:   . Worried About Charity fundraiser in the Last Year: Not on file  . Ran Out of Food in the Last Year: Not on file  Transportation Needs:   . Lack of Transportation (Medical): Not on file  . Lack of  Transportation (Non-Medical): Not on file  Physical Activity:   . Days of Exercise per Week: Not on file  . Minutes of Exercise per Session: Not on file  Stress:   . Feeling of Stress : Not on file  Social Connections:   . Frequency of Communication with Friends and Family: Not on file  . Frequency of Social Gatherings with Friends and Family: Not on file  . Attends Religious Services: Not on file  . Active Member of Clubs or Organizations: Not on file  . Attends Archivist Meetings: Not on file  . Marital Status: Not on file  Intimate Partner Violence:   . Fear  of Current or Ex-Partner: Not on file  . Emotionally Abused: Not on file  . Physically Abused: Not on file  . Sexually Abused: Not on file   Review of Systems  Appetite is good Weight up slightly Not sleeping well--not depressed but up often due to nocturia Still on tamsulosin     Objective:   Physical Exam  Constitutional: He appears well-developed. No distress.  Cardiovascular: Normal rate, regular rhythm and normal heart sounds. Exam reveals no gallop.  No murmur heard. Respiratory: Effort normal and breath sounds normal. No respiratory distress. He has no wheezes. He has no rales.  Sternal wound completely healed  Musculoskeletal:        General: No edema.  Psychiatric: He has a normal mood and affect. His behavior is normal.           Assessment & Plan:

## 2019-11-16 NOTE — Assessment & Plan Note (Signed)
Ongoing symptoms despite the flomax Will consider adding finasteride

## 2019-11-16 NOTE — Assessment & Plan Note (Signed)
Doing well after CABG Hopefully Dr [name] will set up cardiac rehab On statin, etc

## 2019-11-16 NOTE — Assessment & Plan Note (Signed)
Will recheck labs 

## 2019-11-17 ENCOUNTER — Other Ambulatory Visit: Payer: Self-pay

## 2019-11-17 MED ORDER — CLOPIDOGREL BISULFATE 75 MG PO TABS: ORAL_TABLET | ORAL | 3 refills | Status: DC

## 2019-11-17 MED ORDER — METOPROLOL SUCCINATE ER 50 MG PO TB24: ORAL_TABLET | ORAL | 11 refills | Status: DC

## 2019-11-17 MED ORDER — ROSUVASTATIN CALCIUM 10 MG PO TABS: ORAL_TABLET | ORAL | 3 refills | Status: DC

## 2019-11-17 NOTE — Telephone Encounter (Signed)
Received refill request from [facility] for Plavix and Atorvastatin. I called patient to verify which pharmacy he wanted to use and he stated [facility] but [facility] preference and if refills can be sent there. Refills provided as requested  Dr Silvio Pate please review request for Plavix refill. Thank you

## 2019-11-21 ENCOUNTER — Encounter: Payer: Medicare PPO | Attending: Internal Medicine | Admitting: *Deleted

## 2019-11-21 ENCOUNTER — Other Ambulatory Visit: Payer: Self-pay

## 2019-11-21 DIAGNOSIS — I214 Non-ST elevation (NSTEMI) myocardial infarction: Secondary | ICD-10-CM

## 2019-11-21 DIAGNOSIS — R2689 Other abnormalities of gait and mobility: Secondary | ICD-10-CM | POA: Diagnosis not present

## 2019-11-21 DIAGNOSIS — I4892 Unspecified atrial flutter: Secondary | ICD-10-CM | POA: Diagnosis not present

## 2019-11-21 DIAGNOSIS — I2510 Atherosclerotic heart disease of native coronary artery without angina pectoris: Secondary | ICD-10-CM | POA: Diagnosis not present

## 2019-11-21 DIAGNOSIS — I10 Essential (primary) hypertension: Secondary | ICD-10-CM | POA: Diagnosis not present

## 2019-11-21 DIAGNOSIS — N183 Chronic kidney disease, stage 3 unspecified: Secondary | ICD-10-CM | POA: Diagnosis not present

## 2019-11-21 DIAGNOSIS — I129 Hypertensive chronic kidney disease with stage 1 through stage 4 chronic kidney disease, or unspecified chronic kidney disease: Secondary | ICD-10-CM | POA: Diagnosis not present

## 2019-11-21 DIAGNOSIS — M6281 Muscle weakness (generalized): Secondary | ICD-10-CM | POA: Diagnosis not present

## 2019-11-21 DIAGNOSIS — M5431 Sciatica, right side: Secondary | ICD-10-CM | POA: Diagnosis not present

## 2019-11-21 DIAGNOSIS — Z951 Presence of aortocoronary bypass graft: Secondary | ICD-10-CM | POA: Insufficient documentation

## 2019-11-21 NOTE — Progress Notes (Signed)
Initial orientation compelted. Diagnosis can be found in [facility] 11/22. EP orientation scheduled fro 2/8 at 3pm

## 2019-11-22 ENCOUNTER — Other Ambulatory Visit: Payer: Self-pay

## 2019-11-22 ENCOUNTER — Ambulatory Visit: Payer: Medicare PPO | Admitting: Podiatry

## 2019-11-22 DIAGNOSIS — L989 Disorder of the skin and subcutaneous tissue, unspecified: Secondary | ICD-10-CM

## 2019-11-22 DIAGNOSIS — M79676 Pain in unspecified toe(s): Secondary | ICD-10-CM | POA: Diagnosis not present

## 2019-11-22 DIAGNOSIS — B351 Tinea unguium: Secondary | ICD-10-CM

## 2019-11-24 DIAGNOSIS — E782 Mixed hyperlipidemia: Secondary | ICD-10-CM | POA: Diagnosis not present

## 2019-11-24 DIAGNOSIS — R0600 Dyspnea, unspecified: Secondary | ICD-10-CM | POA: Diagnosis not present

## 2019-11-25 NOTE — Progress Notes (Signed)
    Subjective: Patient is a 86 y.o. male presenting to the office today for follow up evaluation of painful callus lesion(s) noted to the bilateral feet. Walking and bearing weight increases the pain. He has not had any recent treatment for the symptoms.  Patient also complains of elongated, thickened nails that cause pain while ambulating in shoes. He is unable to trim his own nails. Patient presents today for further treatment and evaluation.  Past Medical History:  Diagnosis Date  . CAD (coronary artery disease)    s/p PCI after presenting with exertional angina (drug eluting stent)  . Diverticulosis   . GERD (gastroesophageal reflux disease)   . HTN (hypertension)   . Hyperlipidemia   . Osteoarthritis   . PVD (peripheral vascular disease) ([place])    s/p aortobifemoral bypass, with bilateral SFA occlusion and intermittent claudication   . Small bowel obstruction ([facility])     Objective:  Physical Exam General: Alert and oriented x3 in no acute distress  Dermatology: Hyperkeratotic lesion(s) present on the bilateral feet. Pain on palpation with a central nucleated core noted. Skin is warm, dry and supple bilateral lower extremities. Negative for open lesions or macerations. Nails are tender, long, thickened and dystrophic with subungual debris, consistent with onychomycosis, 1-5 bilateral. No signs of infection noted.  Vascular: Palpable pedal pulses bilaterally. No edema or erythema noted. Capillary refill within normal limits.  Neurological: Epicritic and protective threshold grossly intact bilaterally.   Musculoskeletal Exam: Pain on palpation at the keratotic lesion(s) noted. Range of motion within normal limits bilateral. Muscle strength 5/5 in all groups bilateral.  Assessment: 1. Onychodystrophic nails 1-5 bilateral with hyperkeratosis of nails.  2. Onychomycosis of nail due to dermatophyte bilateral 3. Pre-ulcerative callus lesions noted to the bilateral feet x 4   Plan of  Care:  1. Patient evaluated. 2. Excisional debridement of keratoic lesion(s) using a chisel blade was performed without incident.  3. Dressed with light dressing. 4. Mechanical debridement of nails 1-5 bilaterally performed using a nail nipper. Filed with dremel without incident.  5. Patient is to return to the clinic in 3 months.   Edrick Kins, DPM [facility]  Dr. Edrick Kins, [address]                                        [place], [place] [address]                Office [phone]  Fax [phone]

## 2019-11-28 ENCOUNTER — Other Ambulatory Visit: Payer: Self-pay

## 2019-11-28 VITALS — Ht 69.5 in | Wt 195.7 lb

## 2019-11-28 DIAGNOSIS — Z951 Presence of aortocoronary bypass graft: Secondary | ICD-10-CM | POA: Diagnosis not present

## 2019-11-28 DIAGNOSIS — I214 Non-ST elevation (NSTEMI) myocardial infarction: Secondary | ICD-10-CM

## 2019-11-28 NOTE — Patient Instructions (Signed)
Patient Instructions  Patient Details  Name: Brandon Lewis MRN: [id number] Date of Birth: [birth year] Referring Provider:  Corey Skains, MD  Below are your personal goals for exercise, nutrition, and risk factors. Our goal is to help you stay on track towards obtaining and maintaining these goals. We will be discussing your progress on these goals with you throughout the program.  Initial Exercise Prescription: Initial Exercise Prescription - 11/28/19 1600      Date of Initial Exercise RX and Referring Provider   Date  11/28/19    Referring Provider  [name]      Treadmill   MPH  1    Grade  0    Minutes  15    METs  1.77      NuStep   Level  1    SPM  80    Minutes  15    METs  1      Recumbant Elliptical   Level  1    RPM  50    Minutes  15    METs  1      REL-XR   Level  1    Speed  50    Minutes  15    METs  1      Prescription Details   Frequency (times per week)  3    Duration  Progress to 30 minutes of continuous aerobic without signs/symptoms of physical distress      Intensity   THRR 40-80% of Max Heartrate  102-125    Ratings of Perceived Exertion  11-13    Perceived Dyspnea  0-4       Exercise Goals: Frequency: Be able to perform aerobic exercise two to three times per week in program working toward 2-5 days per week of home exercise.  Intensity: Work with a perceived exertion of 11 (fairly light) - 15 (hard) while following your exercise prescription.  We will make changes to your prescription with you as you progress through the program.   Duration: Be able to do 30 to 45 minutes of continuous aerobic exercise in addition to a 5 minute warm-up and a 5 minute cool-down routine.   Nutrition Goals: Your personal nutrition goals will be established when you do your nutrition analysis with the dietician.  The following are general nutrition guidelines to follow: Cholesterol < 200mg /day Sodium < 1500mg /day Fiber: Men over 50 yrs - 30  grams per day  Personal Goals: Personal Goals and Risk Factors at Admission - 11/28/19 1625      Core Components/Risk Factors/Patient Goals on Admission    Weight Management  Yes    Intervention  Weight Management: Develop a combined nutrition and exercise program designed to reach desired caloric intake, while maintaining appropriate intake of nutrient and fiber, sodium and fats, and appropriate energy expenditure required for the weight goal.;Weight Management: Provide education and appropriate resources to help participant work on and attain dietary goals.    Admit Weight  195 lb 11.2 oz (88.8 kg)       Tobacco Use Initial Evaluation: Social History   Tobacco Use  Smoking Status Former Smoker  . Types: Cigarettes  . Quit date: 10/20/1992  . Years since quitting: 27.1  Smokeless Tobacco Never Used  Tobacco Comment   quit in 1994     Exercise Goals and Review: Exercise Goals    Row Name 11/28/19 1619             Exercise Goals  Increase Physical Activity  Yes       Intervention  Provide advice, education, support and counseling about physical activity/exercise needs.;Develop an individualized exercise prescription for aerobic and resistive training based on initial evaluation findings, risk stratification, comorbidities and participant's personal goals.       Expected Outcomes  Short Term: Attend rehab on a regular basis to increase amount of physical activity.;Long Term: Add in home exercise to make exercise part of routine and to increase amount of physical activity.;Long Term: Exercising regularly at least 3-5 days a week.       Increase Strength and Stamina  Yes       Intervention  Provide advice, education, support and counseling about physical activity/exercise needs.;Develop an individualized exercise prescription for aerobic and resistive training based on initial evaluation findings, risk stratification, comorbidities and participant's personal goals.       Expected  Outcomes  Short Term: Increase workloads from initial exercise prescription for resistance, speed, and METs.;Short Term: Perform resistance training exercises routinely during rehab and add in resistance training at home;Long Term: Improve cardiorespiratory fitness, muscular endurance and strength as measured by increased METs and functional capacity (6MWT)       Able to understand and use rate of perceived exertion (RPE) scale  Yes       Intervention  Provide education and explanation on how to use RPE scale       Expected Outcomes  Short Term: Able to use RPE daily in rehab to express subjective intensity level;Long Term:  Able to use RPE to guide intensity level when exercising independently       Able to understand and use Dyspnea scale  Yes       Intervention  Provide education and explanation on how to use Dyspnea scale       Expected Outcomes  Short Term: Able to use Dyspnea scale daily in rehab to express subjective sense of shortness of breath during exertion;Long Term: Able to use Dyspnea scale to guide intensity level when exercising independently       Knowledge and understanding of Target Heart Rate Range (THRR)  Yes       Intervention  Provide education and explanation of THRR including how the numbers were predicted and where they are located for reference       Expected Outcomes  Short Term: Able to state/look up THRR;Short Term: Able to use daily as guideline for intensity in rehab;Long Term: Able to use THRR to govern intensity when exercising independently       Able to check pulse independently  Yes       Intervention  Provide education and demonstration on how to check pulse in carotid and radial arteries.;Review the importance of being able to check your own pulse for safety during independent exercise       Expected Outcomes  Short Term: Able to explain why pulse checking is important during independent exercise;Long Term: Able to check pulse independently and accurately        Understanding of Exercise Prescription  Yes       Intervention  Provide education, explanation, and written materials on patient's individual exercise prescription       Expected Outcomes  Short Term: Able to explain program exercise prescription;Long Term: Able to explain home exercise prescription to exercise independently       Improve claudication pain toleration; Improve walking ability  Yes       Intervention  Participate in PAD/SET Rehab 2-3 days a week,  walking at home as part of exercise prescription;Attend education sessions to aid in risk factor modification and understanding of disease process       Expected Outcomes  Short Term: Improve walking distance/time to onset of claudication pain;Long Term: Improve walking ability and toleration to claudication;Long Term: Improve score of PAD questionnaires          Copy of goals given to participant.

## 2019-11-28 NOTE — Progress Notes (Signed)
Cardiac Individual Treatment Plan  Patient Details  Name: Brandon Lewis MRN: [id number] Date of Birth: [birth year] Referring Provider:     Cardiac Rehab from 11/28/2019 in [facility] Cardiac and Pulmonary Rehab  Referring Provider  [name]      Initial Encounter Date:    Cardiac Rehab from 11/28/2019 in [facility] Cardiac and Pulmonary Rehab  Date  11/28/19      Visit Diagnosis: S/P CABG x 3  NSTEMI (non-ST elevated myocardial infarction) ([place])  Patient's Home Medications on Admission:  Current Outpatient Medications:  .  amiodarone (PACERONE) 200 MG tablet, Take 1 tablet (200 mg total) by mouth 2 (two) times daily. Take one tablet by mouth TWICE daily for 7 days  then take one tablet by mouth ONCE daily., Disp: 60 tablet, Rfl: 1 .  aspirin 81 MG chewable tablet, Chew 1 tablet (81 mg total) by mouth daily., Disp:  , Rfl:  .  cholecalciferol (VITAMIN D) 1000 UNITS tablet, Take 1,000 Units by mouth at bedtime. , Disp: , Rfl:  .  clopidogrel (PLAVIX) 75 MG tablet, TAKE ONE TABLET BY MOUTH EVERY DAY *BOTTLE*, Disp: 90 tablet, Rfl: 3 .  Magnesium 400 MG CAPS, Take by mouth daily., Disp: , Rfl:  .  metoprolol succinate (TOPROL-XL) 50 MG 24 hr tablet, **VIAL ONLY** TAKE (1) TABLET BY MOUTH ONCE A DAY.DO NOT CRUSH. (HIGHBLOOD PRESSURE) *BOTTLE*, Disp: 30 tablet, Rfl: 11 .  Multiple Vitamin (MULTIVITAMIN WITH MINERALS) TABS tablet, Take 1 tablet by mouth every morning., Disp: , Rfl:  .  rosuvastatin (CRESTOR) 10 MG tablet, **VIAL ONLY** TAKE ONE TABLET BY MOUTH AT BEDTIME. (IMPROVES CHOLESTEROL), Disp: 90 tablet, Rfl: 3 .  sodium chloride (BRONCHO SALINE) inhaler solution, Take 1 spray by nebulization daily as needed (congestion)., Disp: , Rfl:  .  tamsulosin (FLOMAX) 0.4 MG CAPS capsule, Take 1 capsule (0.4 mg total) by mouth daily., Disp: 30 capsule, Rfl: 2  Current Facility-Administered Medications:  .  mometasone-formoterol (DULERA) 100-5 MCG/ACT inhaler 2 puff, 2 puff, Inhalation, BID, Atkins,  Glenice Bow, MD  Past Medical History: Past Medical History:  Diagnosis Date  . CAD (coronary artery disease)    s/p PCI after presenting with exertional angina (drug eluting stent)  . Diverticulosis   . GERD (gastroesophageal reflux disease)   . HTN (hypertension)   . Hyperlipidemia   . Osteoarthritis   . PVD (peripheral vascular disease) ([place])    s/p aortobifemoral bypass, with bilateral SFA occlusion and intermittent claudication   . Small bowel obstruction ([facility])     Tobacco Use: Social History   Tobacco Use  Smoking Status Former Smoker  . Types: Cigarettes  . Quit date: 10/20/1992  . Years since quitting: 27.1  Smokeless Tobacco Never Used  Tobacco Comment   quit in 1994     Labs: Recent Review Flowsheet Data    Labs for ITP Cardiac and Pulmonary Rehab Latest Ref Rng & Units 09/16/2019 09/16/2019 09/16/2019 09/16/2019 09/16/2019   Cholestrol 0 - 200 mg/dL - - - - -   LDLCALC 0 - 99 mg/dL - - - - -   LDLDIRECT mg/dL - - - - -   HDL >40 mg/dL - - - - -   Trlycerides <150 mg/dL - - - - -   Hemoglobin A1c 4.8 - 5.6 % - - - - -   PHART 7.350 - 7.450 - - 7.313(L) 7.330(L) 7.313(L)   PCO2ART 32.0 - 48.0 mmHg - - 42.7 29.1(L) 31.9(L)   HCO3 20.0 - 28.0 mmol/L - -  21.7 15.4(L) 16.2(L)   TCO2 22 - 32 mmol/L _0 16(L) 17(L)   ACIDBASEDEF 0.0 - 2.0 mmol/L - - 4.0(H) 10.0(H) 9.0(H)   O2SAT % - - 99.0 91.0 96.0       Exercise Target Goals: Exercise Program Goal: Individual exercise prescription set using results from initial 6 min walk test and THRR while considering  patient's activity barriers and safety.   Exercise Prescription Goal: Initial exercise prescription builds to 30-45 minutes a day of aerobic activity, 2-3 days per week.  Home exercise guidelines will be given to patient during program as part of exercise prescription that the participant will acknowledge.  Activity Barriers & Risk Stratification: Activity Barriers & Cardiac Risk Stratification -  11/21/19 1415      Activity Barriers & Cardiac Risk Stratification   Activity Barriers  Other (comment);Back Problems    Comments  claudication pain    Cardiac Risk Stratification  High       6 Minute Walk: 6 Minute Walk    Row Name 11/28/19 1609         6 Minute Walk   Phase  Discharge     Distance  610 feet     Walk Time  6 minutes     # of Rest Breaks  0     MPH  1.15     METS  1     RPE  15     Perceived Dyspnea   3     VO2 Peak  3.53     Symptoms  Yes (comment)     Comments  claudication leg pain 6/10 ( normal for him walking)     Resting HR  79 bpm     Resting BP  122/56     Resting Oxygen Saturation   97 %     Exercise Oxygen Saturation  during 6 min walk  99 %     Max Ex. HR  102 bpm     Max Ex. BP  134/54     2 Minute Post BP  130/56        Oxygen Initial Assessment:   Oxygen Re-Evaluation:   Oxygen Discharge (Final Oxygen Re-Evaluation):   Initial Exercise Prescription: Initial Exercise Prescription - 11/28/19 1600      Date of Initial Exercise RX and Referring Provider   Date  11/28/19    Referring Provider  [name]      Treadmill   MPH  1    Grade  0    Minutes  15    METs  1.77      NuStep   Level  1    SPM  80    Minutes  15    METs  1      Recumbant Elliptical   Level  1    RPM  50    Minutes  15    METs  1      REL-XR   Level  1    Speed  50    Minutes  15    METs  1      Prescription Details   Frequency (times per week)  3    Duration  Progress to 30 minutes of continuous aerobic without signs/symptoms of physical distress      Intensity   THRR 40-80% of Max Heartrate  102-125    Ratings of Perceived Exertion  11-13    Perceived Dyspnea  0-4  Perform Capillary Blood Glucose checks as needed.  Exercise Prescription Changes: Exercise Prescription Changes    Row Name 11/28/19 1600             Response to Exercise   Blood Pressure (Admit)  122/56       Blood Pressure (Exercise)  134/54       Blood  Pressure (Exit)  130/56       Heart Rate (Admit)  79 bpm       Heart Rate (Exercise)  102 bpm       Heart Rate (Exit)  86 bpm       Oxygen Saturation (Admit)  97 %       Oxygen Saturation (Exercise)  99 %       Rating of Perceived Exertion (Exercise)  15       Perceived Dyspnea (Exercise)  3       Symptoms  claudication leg pain          Exercise Comments:   Exercise Goals and Review: Exercise Goals    Row Name 11/28/19 1619             Exercise Goals   Increase Physical Activity  Yes       Intervention  Provide advice, education, support and counseling about physical activity/exercise needs.;Develop an individualized exercise prescription for aerobic and resistive training based on initial evaluation findings, risk stratification, comorbidities and participant's personal goals.       Expected Outcomes  Short Term: Attend rehab on a regular basis to increase amount of physical activity.;Long Term: Add in home exercise to make exercise part of routine and to increase amount of physical activity.;Long Term: Exercising regularly at least 3-5 days a week.       Increase Strength and Stamina  Yes       Intervention  Provide advice, education, support and counseling about physical activity/exercise needs.;Develop an individualized exercise prescription for aerobic and resistive training based on initial evaluation findings, risk stratification, comorbidities and participant's personal goals.       Expected Outcomes  Short Term: Increase workloads from initial exercise prescription for resistance, speed, and METs.;Short Term: Perform resistance training exercises routinely during rehab and add in resistance training at home;Long Term: Improve cardiorespiratory fitness, muscular endurance and strength as measured by increased METs and functional capacity (6MWT)       Able to understand and use rate of perceived exertion (RPE) scale  Yes       Intervention  Provide education and explanation on  how to use RPE scale       Expected Outcomes  Short Term: Able to use RPE daily in rehab to express subjective intensity level;Long Term:  Able to use RPE to guide intensity level when exercising independently       Able to understand and use Dyspnea scale  Yes       Intervention  Provide education and explanation on how to use Dyspnea scale       Expected Outcomes  Short Term: Able to use Dyspnea scale daily in rehab to express subjective sense of shortness of breath during exertion;Long Term: Able to use Dyspnea scale to guide intensity level when exercising independently       Knowledge and understanding of Target Heart Rate Range (THRR)  Yes       Intervention  Provide education and explanation of THRR including how the numbers were predicted and where they are located for reference  Expected Outcomes  Short Term: Able to state/look up THRR;Short Term: Able to use daily as guideline for intensity in rehab;Long Term: Able to use THRR to govern intensity when exercising independently       Able to check pulse independently  Yes       Intervention  Provide education and demonstration on how to check pulse in carotid and radial arteries.;Review the importance of being able to check your own pulse for safety during independent exercise       Expected Outcomes  Short Term: Able to explain why pulse checking is important during independent exercise;Long Term: Able to check pulse independently and accurately       Understanding of Exercise Prescription  Yes       Intervention  Provide education, explanation, and written materials on patient's individual exercise prescription       Expected Outcomes  Short Term: Able to explain program exercise prescription;Long Term: Able to explain home exercise prescription to exercise independently       Improve claudication pain toleration; Improve walking ability  Yes       Intervention  Participate in PAD/SET Rehab 2-3 days a week, walking at home as part of  exercise prescription;Attend education sessions to aid in risk factor modification and understanding of disease process       Expected Outcomes  Short Term: Improve walking distance/time to onset of claudication pain;Long Term: Improve walking ability and toleration to claudication;Long Term: Improve score of PAD questionnaires          Exercise Goals Re-Evaluation :   Discharge Exercise Prescription (Final Exercise Prescription Changes): Exercise Prescription Changes - 11/28/19 1600      Response to Exercise   Blood Pressure (Admit)  122/56    Blood Pressure (Exercise)  134/54    Blood Pressure (Exit)  130/56    Heart Rate (Admit)  79 bpm    Heart Rate (Exercise)  102 bpm    Heart Rate (Exit)  86 bpm    Oxygen Saturation (Admit)  97 %    Oxygen Saturation (Exercise)  99 %    Rating of Perceived Exertion (Exercise)  15    Perceived Dyspnea (Exercise)  3    Symptoms  claudication leg pain       Nutrition:  Target Goals: Understanding of nutrition guidelines, daily intake of sodium '1500mg'$ , cholesterol '200mg'$ , calories 30% from fat and 7% or less from saturated fats, daily to have 5 or more servings of fruits and vegetables.  Biometrics: Pre Biometrics - 11/28/19 1620      Pre Biometrics   Height  5' 9.5" (1.765 m)    Weight  195 lb 11.2 oz (88.8 kg)    BMI (Calculated)  28.5    Single Leg Stand  8.82 seconds        Nutrition Therapy Plan and Nutrition Goals:   Nutrition Assessments:   Nutrition Goals Re-Evaluation:   Nutrition Goals Discharge (Final Nutrition Goals Re-Evaluation):   Psychosocial: Target Goals: Acknowledge presence or absence of significant depression and/or stress, maximize coping skills, provide positive support system. Participant is able to verbalize types and ability to use techniques and skills needed for reducing stress and depression.   Initial Review & Psychosocial Screening: Initial Psych Review & Screening - 11/21/19 1415       Initial Review   Current issues with  Current Sleep Concerns;Current Stress Concerns    Source of Stress Concerns  Chronic Illness;Family    Comments  having to  get up to use bathroom      Family Dynamics   Good Support System?  Yes      Barriers   Psychosocial barriers to participate in program  There are no identifiable barriers or psychosocial needs.;The patient should benefit from training in stress management and relaxation.      Screening Interventions   Interventions  Encouraged to exercise;To provide support and resources with identified psychosocial needs;Provide feedback about the scores to participant    Expected Outcomes  Short Term goal: Utilizing psychosocial counselor, staff and physician to assist with identification of specific Stressors or current issues interfering with healing process. Setting desired goal for each stressor or current issue identified.;Long Term Goal: Stressors or current issues are controlled or eliminated.;Short Term goal: Identification and review with participant of any Quality of Life or Depression concerns found by scoring the questionnaire.;Long Term goal: The participant improves quality of Life and PHQ9 Scores as seen by post scores and/or verbalization of changes       Quality of Life Scores:   Scores of 19 and below usually indicate a poorer quality of life in these areas.  A difference of  2-3 points is a clinically meaningful difference.  A difference of 2-3 points in the total score of the Quality of Life Index has been associated with significant improvement in overall quality of life, self-image, physical symptoms, and general health in studies assessing change in quality of life.  PHQ-9: Recent Review Flowsheet Data    Depression screen [facility] 2/9 09/13/2018 08/24/2017 08/21/2016 08/14/2015 07/25/2014   Decreased Interest 0 0 0 0 0   Down, Depressed, Hopeless 0 0 0 0 0   PHQ - 2 Score 0 0 0 0 0     Interpretation of Total Score  Total  Score Depression Severity:  1-4 = Minimal depression, 5-9 = Mild depression, 10-14 = Moderate depression, 15-19 = Moderately severe depression, 20-27 = Severe depression   Psychosocial Evaluation and Intervention: Psychosocial Evaluation - 11/21/19 1422      Psychosocial Evaluation & Interventions   Comments  Izell [place] 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 He has a good support system. He helps care for his wife who had a stroke recently, so that keeps him busy. Taking time for himself may be difficult, but he wants to take care of himself for her. He does have a $20 copay but he is going to try to figure something out    Expected Outcomes  Short: attend cardiac rehab as much as possible. Long: develop positive self care habits.    Continue Psychosocial Services   Follow up required by staff       Psychosocial Re-Evaluation:   Psychosocial Discharge (Final Psychosocial Re-Evaluation):   Vocational Rehabilitation: Provide vocational rehab assistance to qualifying candidates.   Vocational Rehab Evaluation & Intervention: Vocational Rehab - 11/21/19 1422      Initial Vocational Rehab Evaluation & Intervention   Assessment shows need for Vocational Rehabilitation  No       Education: Education Goals: Education classes will be provided on a variety of topics geared toward better understanding of heart health and risk factor modification. Participant will state understanding/return demonstration of topics presented as noted by education test scores.  Learning Barriers/Preferences: Learning Barriers/Preferences - 11/21/19 1422      Learning Barriers/Preferences   Learning Barriers  Hearing    Learning Preferences  None       Education Topics:  AED/CPR: - Group verbal and written  instruction with the use of models to demonstrate the basic use of the AED with the basic ABC's of resuscitation.   General Nutrition Guidelines/Fats and Fiber: -Group instruction provided by verbal,  written material, models and posters to present the general guidelines for heart healthy nutrition. Gives an explanation and review of dietary fats and fiber.   Controlling Sodium/Reading Food Labels: -Group verbal and written material supporting the discussion of sodium use in heart healthy nutrition. Review and explanation with models, verbal and written materials for utilization of the food label.   Exercise Physiology & General Exercise Guidelines: - Group verbal and written instruction with models to review the exercise physiology of the cardiovascular system and associated critical values. Provides general exercise guidelines with specific guidelines to those with heart or lung disease.    Aerobic Exercise & Resistance Training: - Gives group verbal and written instruction on the various components of exercise. Focuses on aerobic and resistive training programs and the benefits of this training and how to safely progress through these programs..   Flexibility, Balance, Mind/Body Relaxation: Provides group verbal/written instruction on the benefits of flexibility and balance training, including mind/body exercise modes such as yoga, pilates and tai chi.  Demonstration and skill practice provided.   Stress and Anxiety: - Provides group verbal and written instruction about the health risks of elevated stress and causes of high stress.  Discuss the correlation between heart/lung disease and anxiety and treatment options. Review healthy ways to manage with stress and anxiety.   Depression: - Provides group verbal and written instruction on the correlation between heart/lung disease and depressed mood, treatment options, and the stigmas associated with seeking treatment.   Anatomy & Physiology of the Heart: - Group verbal and written instruction and models provide basic cardiac anatomy and physiology, with the coronary electrical and arterial systems. Review of Valvular disease and Heart  Failure   Cardiac Procedures: - Group verbal and written instruction to review commonly prescribed medications for heart disease. Reviews the medication, class of the drug, and side effects. Includes the steps to properly store meds and maintain the prescription regimen. (beta blockers and nitrates)   Cardiac Medications I: - Group verbal and written instruction to review commonly prescribed medications for heart disease. Reviews the medication, class of the drug, and side effects. Includes the steps to properly store meds and maintain the prescription regimen.   Cardiac Medications II: -Group verbal and written instruction to review commonly prescribed medications for heart disease. Reviews the medication, class of the drug, and side effects. (all other drug classes)    Go Sex-Intimacy & Heart Disease, Get SMART - Goal Setting: - Group verbal and written instruction through game format to discuss heart disease and the return to sexual intimacy. Provides group verbal and written material to discuss and apply goal setting through the application of the S.M.A.R.T. Method.   Other Matters of the Heart: - Provides group verbal, written materials and models to describe Stable Angina and Peripheral Artery. Includes description of the disease process and treatment options available to the cardiac patient.   Exercise & Equipment Safety: - Individual verbal instruction and demonstration of equipment use and safety with use of the equipment.   Cardiac Rehab from 11/28/2019 in [facility] Cardiac and Pulmonary Rehab  Date  11/28/19  Educator  AS  Instruction Review Code  1- Verbalizes Understanding      Infection Prevention: - Provides verbal and written material to individual with discussion of infection control including proper hand  washing and proper equipment cleaning during exercise session.   Cardiac Rehab from 11/28/2019 in [facility] Cardiac and Pulmonary Rehab  Date  11/28/19  Educator  AS   Instruction Review Code  1- Verbalizes Understanding      Falls Prevention: - Provides verbal and written material to individual with discussion of falls prevention and safety.   Cardiac Rehab from 11/28/2019 in [facility] Cardiac and Pulmonary Rehab  Date  11/28/19  Educator  AS  Instruction Review Code  1- Verbalizes Understanding      Diabetes: - Individual verbal and written instruction to review signs/symptoms of diabetes, desired ranges of glucose level fasting, after meals and with exercise. Acknowledge that pre and post exercise glucose checks will be done for 3 sessions at entry of program.   Know Your Numbers and Risk Factors: -Group verbal and written instruction about important numbers in your health.  Discussion of what are risk factors and how they play a role in the disease process.  Review of Cholesterol, Blood Pressure, Diabetes, and BMI and the role they play in your overall health.   Sleep Hygiene: -Provides group verbal and written instruction about how sleep can affect your health.  Define sleep hygiene, discuss sleep cycles and impact of sleep habits. Review good sleep hygiene tips.    Other: -Provides group and verbal instruction on various topics (see comments)   Knowledge Questionnaire Score:   Core Components/Risk Factors/Patient Goals at Admission: Personal Goals and Risk Factors at Admission - 11/21/19 1421      Core Components/Risk Factors/Patient Goals on Admission   Hypertension  Yes    Intervention  Provide education on lifestyle modifcations including regular physical activity/exercise, weight management, moderate sodium restriction and increased consumption of fresh fruit, vegetables, and low fat dairy, alcohol moderation, and smoking cessation.;Monitor prescription use compliance.    Expected Outcomes  Short Term: Continued assessment and intervention until BP is < 140/22m HG in hypertensive participants. < 130/817mHG in hypertensive participants  with diabetes, heart failure or chronic kidney disease.;Long Term: Maintenance of blood pressure at goal levels.    Lipids  Yes    Intervention  Provide education and support for participant on nutrition & aerobic/resistive exercise along with prescribed medications to achieve LDL '70mg'$ , HDL >'40mg'$ .    Expected Outcomes  Short Term: Participant states understanding of desired cholesterol values and is compliant with medications prescribed. Participant is following exercise prescription and nutrition guidelines.;Long Term: Cholesterol controlled with medications as prescribed, with individualized exercise RX and with personalized nutrition plan. Value goals: LDL < '70mg'$ , HDL > 40 mg.       Core Components/Risk Factors/Patient Goals Review:    Core Components/Risk Factors/Patient Goals at Discharge (Final Review):    ITP Comments: ITP Comments    Row Name 11/21/19 1420           ITP Comments  Initial orientation compelted. [facility] EP orientation scheduled fro 2/8 at 3pm          Comments: initial ITP

## 2019-11-30 DIAGNOSIS — M6281 Muscle weakness (generalized): Secondary | ICD-10-CM | POA: Diagnosis not present

## 2019-11-30 DIAGNOSIS — I4892 Unspecified atrial flutter: Secondary | ICD-10-CM | POA: Diagnosis not present

## 2019-11-30 DIAGNOSIS — Z951 Presence of aortocoronary bypass graft: Secondary | ICD-10-CM | POA: Diagnosis not present

## 2019-11-30 DIAGNOSIS — R2689 Other abnormalities of gait and mobility: Secondary | ICD-10-CM | POA: Diagnosis not present

## 2019-11-30 DIAGNOSIS — I10 Essential (primary) hypertension: Secondary | ICD-10-CM | POA: Diagnosis not present

## 2019-11-30 DIAGNOSIS — N183 Chronic kidney disease, stage 3 unspecified: Secondary | ICD-10-CM | POA: Diagnosis not present

## 2019-11-30 DIAGNOSIS — I129 Hypertensive chronic kidney disease with stage 1 through stage 4 chronic kidney disease, or unspecified chronic kidney disease: Secondary | ICD-10-CM | POA: Diagnosis not present

## 2019-11-30 DIAGNOSIS — I2510 Atherosclerotic heart disease of native coronary artery without angina pectoris: Secondary | ICD-10-CM | POA: Diagnosis not present

## 2019-11-30 DIAGNOSIS — M5431 Sciatica, right side: Secondary | ICD-10-CM | POA: Diagnosis not present

## 2019-11-30 DIAGNOSIS — I25119 Atherosclerotic heart disease of native coronary artery with unspecified angina pectoris: Secondary | ICD-10-CM | POA: Diagnosis not present

## 2019-11-30 DIAGNOSIS — I214 Non-ST elevation (NSTEMI) myocardial infarction: Secondary | ICD-10-CM | POA: Diagnosis not present

## 2019-11-30 DIAGNOSIS — E785 Hyperlipidemia, unspecified: Secondary | ICD-10-CM | POA: Diagnosis not present

## 2019-12-01 ENCOUNTER — Encounter: Payer: Medicare PPO | Admitting: *Deleted

## 2019-12-01 ENCOUNTER — Other Ambulatory Visit: Payer: Self-pay

## 2019-12-01 DIAGNOSIS — I214 Non-ST elevation (NSTEMI) myocardial infarction: Secondary | ICD-10-CM | POA: Diagnosis not present

## 2019-12-01 DIAGNOSIS — Z951 Presence of aortocoronary bypass graft: Secondary | ICD-10-CM

## 2019-12-01 NOTE — Progress Notes (Signed)
Daily Session Note  Patient Details  Name: Brandon Lewis MRN: [id number] Date of Birth: [birth year] Referring Provider:     Cardiac Rehab from 11/28/2019 in [facility] Re[facility]  Referring Provider  [name]      Encounter Date: 12/01/2019  Check In: Session Check In - 12/01/19 1550      Check-In   Supervising physician immediately available to respond to emergencies  See telemetry face sheet for immediately available ER MD    Location  [facility]-Cardiac & Pulmonary Rehab    Staff Present  Heath Lark, RN, BSN, CCRP;Meredith Sherryll Burger, RN BSN;Jessica Olde [place], MA, RCEP, CCRP, CCET;Joseph Landrum RCP,RRT,BSRT    Virtual Visit  No    Medication changes reported      No    Fall or balance concerns reported     No    Warm-up and Cool-down  Performed on first and last piece of equipment    Resistance Training Performed  Yes    VAD Patient?  No    PAD/SET Patient?  No      Pain Assessment   Currently in Pain?  No/denies          Social History   Tobacco Use  Smoking Status Former Smoker  . Types: Cigarettes  . Quit date: 10/20/1992  . Years since quitting: 27.1  Smokeless Tobacco Never Used  Tobacco Comment   quit in 1994     Goals Met:  Exercise tolerated well Personal goals reviewed No report of cardiac concerns or symptoms  Goals Unmet:  Not Applicable  Comments: First full day of exercise!  Patient was oriented to gym and equipment including functions, settings, policies, and procedures.  Patient's individual exercise prescription and treatment plan were reviewed.  All starting workloads were established based on the results of the 6 minute walk test done at initial orientation visit.  The plan for exercise progression was also introduced and progression will be customized based on patient's performance and goals.    Dr. Emily Filbert is Medical Director for [place] and [facility].

## 2019-12-05 ENCOUNTER — Encounter: Payer: Medicare PPO | Admitting: *Deleted

## 2019-12-05 ENCOUNTER — Other Ambulatory Visit: Payer: Self-pay

## 2019-12-05 DIAGNOSIS — I214 Non-ST elevation (NSTEMI) myocardial infarction: Secondary | ICD-10-CM

## 2019-12-05 DIAGNOSIS — Z951 Presence of aortocoronary bypass graft: Secondary | ICD-10-CM

## 2019-12-05 NOTE — Progress Notes (Signed)
Daily Session Note  Patient Details  Name: Brandon Lewis MRN: [id number] Date of Birth: 04-04-1934 Referring Provider:     Cardiac Rehab from 11/28/2019 in [facility] Cardiac [facility] Rehab  Referring Provider  [name]      Encounter Date: 12/05/2019  Check In: Session Check In - 12/05/19 1508      Check-In   Supervising physician immediately available to respond to emergencies  See telemetry face sheet for immediately available ER MD    Location  [facility]    Staff Present  Renita Papa, RN BSN;Joseph [address] [place], [place], ACSM CEP, Exercise Physiologist    Virtual Visit  No    Medication changes reported      No    Fall or balance concerns reported     No    Warm-up and Cool-down  Performed on first and last piece of equipment    Resistance Training Performed  Yes    VAD Patient?  No    PAD/SET Patient?  No      Pain Assessment   Currently in Pain?  No/denies          Social History   Tobacco Use  Smoking Status Former Smoker  . Types: Cigarettes  . Quit date: 10/20/1992  . Years since quitting: 27.1  Smokeless Tobacco Never Used  Tobacco Comment   quit in 1994     Goals Met:  Independence with exercise equipment Exercise tolerated well No report of cardiac concerns or symptoms Strength training completed today  Goals Unmet:  Not Applicable  Comments: Pt able to follow exercise prescription today without complaint.  Will continue to monitor for progression.    Dr. Emily Filbert is Medical Director for [place] and [facility].

## 2019-12-12 ENCOUNTER — Other Ambulatory Visit: Payer: Self-pay

## 2019-12-12 ENCOUNTER — Encounter: Payer: Medicare PPO | Admitting: *Deleted

## 2019-12-12 DIAGNOSIS — Z951 Presence of aortocoronary bypass graft: Secondary | ICD-10-CM

## 2019-12-12 DIAGNOSIS — I214 Non-ST elevation (NSTEMI) myocardial infarction: Secondary | ICD-10-CM | POA: Diagnosis not present

## 2019-12-12 NOTE — Progress Notes (Signed)
Daily Session Note  Patient Details  Name: Brandon Lewis MRN: [id number] Date of Birth: 1934/08/06 Referring Provider:     Cardiac Rehab from 11/28/2019 in [facility] Cardiac and Pul[facility] Rehab  Referring Provider  [name]      Encounter Date: 12/12/2019  Check In: Session Check In - 12/12/19 1514      Check-In   Supervising physician immediately available to respond to emergencies  See telemetry face sheet for immediately available ER MD    Location  [facility]    Staff Present  Renita Papa, RN Moises Blood, BS, ACSM CEP, Exercise Physiologist;Joseph Tessie Fass RCP,RRT,BSRT    Virtual Visit  No    Medication changes reported      No    Fall or balance concerns reported     No    Warm-up and Cool-down  Performed on first and last piece of equipment    Resistance Training Performed  Yes    VAD Patient?  No    PAD/SET Patient?  No      Pain Assessment   Currently in Pain?  No/denies          Social History   Tobacco Use  Smoking Status Former Smoker  . Types: Cigarettes  . Quit date: 10/20/1992  . Years since quitting: 27.1  Smokeless Tobacco Never Used  Tobacco Comment   quit in 1994     Goals Met:  Independence with exercise equipment Exercise tolerated well No report of cardiac concerns or symptoms Strength training completed today  Goals Unmet:  Not Applicable  Comments: Pt able to follow exercise prescription today without complaint.  Will continue to monitor for progression.    Dr. Emily Filbert is Medical Director for [place] and [facility].

## 2019-12-14 ENCOUNTER — Encounter: Payer: Self-pay | Admitting: *Deleted

## 2019-12-14 DIAGNOSIS — Z951 Presence of aortocoronary bypass graft: Secondary | ICD-10-CM

## 2019-12-14 DIAGNOSIS — I214 Non-ST elevation (NSTEMI) myocardial infarction: Secondary | ICD-10-CM

## 2019-12-14 NOTE — Progress Notes (Signed)
Cardiac Individual Treatment Plan  Patient Details  Name: Brandon Lewis MRN: [id number] Date of Birth: [birth year] Referring Provider:     Cardiac Rehab from 11/28/2019 in [facility] Cardiac and Pulmonary Rehab  Referring Provider  [name]      Initial Encounter Date:    Cardiac Rehab from 11/28/2019 in [facility] Cardiac and Pulmonary Rehab  Date  11/28/19      Visit Diagnosis: S/P CABG x 3  NSTEMI (non-ST elevated myocardial infarction) ([place])  Patient's Home Medications on Admission:  Current Outpatient Medications:  .  amiodarone (PACERONE) 200 MG tablet, Take 1 tablet (200 mg total) by mouth 2 (two) times daily. Take one tablet by mouth TWICE daily for 7 days  then take one tablet by mouth ONCE daily., Disp: 60 tablet, Rfl: 1 .  aspirin 81 MG chewable tablet, Chew 1 tablet (81 mg total) by mouth daily., Disp:  , Rfl:  .  cholecalciferol (VITAMIN D) 1000 UNITS tablet, Take 1,000 Units by mouth at bedtime. , Disp: , Rfl:  .  clopidogrel (PLAVIX) 75 MG tablet, TAKE ONE TABLET BY MOUTH EVERY DAY *BOTTLE*, Disp: 90 tablet, Rfl: 3 .  Magnesium 400 MG CAPS, Take by mouth daily., Disp: , Rfl:  .  metoprolol succinate (TOPROL-XL) 50 MG 24 hr tablet, **VIAL ONLY** TAKE (1) TABLET BY MOUTH ONCE A DAY.DO NOT CRUSH. (HIGHBLOOD PRESSURE) *BOTTLE*, Disp: 30 tablet, Rfl: 11 .  Multiple Vitamin (MULTIVITAMIN WITH MINERALS) TABS tablet, Take 1 tablet by mouth every morning., Disp: , Rfl:  .  rosuvastatin (CRESTOR) 10 MG tablet, **VIAL ONLY** TAKE ONE TABLET BY MOUTH AT BEDTIME. (IMPROVES CHOLESTEROL), Disp: 90 tablet, Rfl: 3 .  sodium chloride (BRONCHO SALINE) inhaler solution, Take 1 spray by nebulization daily as needed (congestion)., Disp: , Rfl:  .  tamsulosin (FLOMAX) 0.4 MG CAPS capsule, Take 1 capsule (0.4 mg total) by mouth daily., Disp: 30 capsule, Rfl: 2  Current Facility-Administered Medications:  .  mometasone-formoterol (DULERA) 100-5 MCG/ACT inhaler 2 puff, 2 puff, Inhalation, BID, Atkins,  Glenice Bow, MD  Past Medical History: Past Medical History:  Diagnosis Date  . CAD (coronary artery disease)    s/p PCI after presenting with exertional angina (drug eluting stent)  . Diverticulosis   . GERD (gastroesophageal reflux disease)   . HTN (hypertension)   . Hyperlipidemia   . Osteoarthritis   . PVD (peripheral vascular disease) ([place])    s/p aortobifemoral bypass, with bilateral SFA occlusion and intermittent claudication   . Small bowel obstruction (HCC)     Tobacco Use: Social History   Tobacco Use  Smoking Status Former Smoker  . Types: Cigarettes  . Quit date: 10/20/1992  . Years since quitting: 27.1  Smokeless Tobacco Never Used  Tobacco Comment   quit in 1994     Labs: Recent Review Flowsheet Data    Labs for ITP Cardiac and Pulmonary Rehab Latest Ref Rng & Units 09/16/2019 09/16/2019 09/16/2019 09/16/2019 09/16/2019   Cholestrol 0 - 200 mg/dL - - - - -   LDLCALC 0 - 99 mg/dL - - - - -   LDLDIRECT mg/dL - - - - -   HDL >40 mg/dL - - - - -   Trlycerides <150 mg/dL - - - - -   Hemoglobin A1c 4.8 - 5.6 % - - - - -   PHART 7.350 - 7.450 - - 7.313(L) 7.330(L) 7.313(L)   PCO2ART 32.0 - 48.0 mmHg - - 42.7 29.1(L) 31.9(L)   HCO3 20.0 - 28.0 mmol/L - -  21.7 15.4(L) 16.2(L)   TCO2 22 - 32 mmol/L _0 16(L) 17(L)   ACIDBASEDEF 0.0 - 2.0 mmol/L - - 4.0(H) 10.0(H) 9.0(H)   O2SAT % - - 99.0 91.0 96.0       Exercise Target Goals: Exercise Program Goal: Individual exercise prescription set using results from initial 6 min walk test and THRR while considering  patient's activity barriers and safety.   Exercise Prescription Goal: Initial exercise prescription builds to 30-45 minutes a day of aerobic activity, 2-3 days per week.  Home exercise guidelines will be given to patient during program as part of exercise prescription that the participant will acknowledge.  Activity Barriers & Risk Stratification: Activity Barriers & Cardiac Risk Stratification -  11/21/19 1415      Activity Barriers & Cardiac Risk Stratification   Activity Barriers  Other (comment);Back Problems    Comments  claudication pain    Cardiac Risk Stratification  High       6 Minute Walk: 6 Minute Walk    Row Name 11/28/19 1609         6 Minute Walk   Phase  Discharge     Distance  610 feet     Walk Time  6 minutes     # of Rest Breaks  0     MPH  1.15     METS  1     RPE  15     Perceived Dyspnea   3     VO2 Peak  3.53     Symptoms  Yes (comment)     Comments  claudication leg pain 6/10 ( normal for him walking)     Resting HR  79 bpm     Resting BP  122/56     Resting Oxygen Saturation   97 %     Exercise Oxygen Saturation  during 6 min walk  99 %     Max Ex. HR  102 bpm     Max Ex. BP  134/54     2 Minute Post BP  130/56        Oxygen Initial Assessment:   Oxygen Re-Evaluation:   Oxygen Discharge (Final Oxygen Re-Evaluation):   Initial Exercise Prescription: Initial Exercise Prescription - 11/28/19 1600      Date of Initial Exercise RX and Referring Provider   Date  11/28/19    Referring Provider  [name]      Treadmill   MPH  1    Grade  0    Minutes  15    METs  1.77      NuStep   Level  1    SPM  80    Minutes  15    METs  1      Recumbant Elliptical   Level  1    RPM  50    Minutes  15    METs  1      REL-XR   Level  1    Speed  50    Minutes  15    METs  1      Prescription Details   Frequency (times per week)  3    Duration  Progress to 30 minutes of continuous aerobic without signs/symptoms of physical distress      Intensity   THRR 40-80% of Max Heartrate  102-125    Ratings of Perceived Exertion  11-13    Perceived Dyspnea  0-4  Perform Capillary Blood Glucose checks as needed.  Exercise Prescription Changes: Exercise Prescription Changes    Row Name 11/28/19 1600 12/07/19 0900           Response to Exercise   Blood Pressure (Admit)  122/56  120/60      Blood Pressure (Exercise)   134/54  116/62      Blood Pressure (Exit)  130/56  116/58      Heart Rate (Admit)  79 bpm  79 bpm      Heart Rate (Exercise)  102 bpm  115 bpm      Heart Rate (Exit)  86 bpm  89 bpm      Oxygen Saturation (Admit)  97 %  --      Oxygen Saturation (Exercise)  99 %  --      Rating of Perceived Exertion (Exercise)  15  15      Perceived Dyspnea (Exercise)  3  --      Symptoms  claudication leg pain  --      Comments  --  second day      Duration  --  Progress to 30 minutes of  aerobic without signs/symptoms of physical distress      Intensity  --  THRR unchanged        Progression   Progression  --  Continue to progress workloads to maintain intensity without signs/symptoms of physical distress.      Average METs  --  2        Resistance Training   Weight  --  3 lb      Reps  --  10-15        Interval Training   Interval Training  --  No        NuStep   Level  --  3      SPM  --  80      Minutes  --  15      METs  --  2.3        Biostep-RELP   Level  --  3      SPM  --  50      Minutes  --  15      METs  --  2         Exercise Comments: Exercise Comments    Row Name 12/01/19 1551           Exercise Comments  First full day of exercise!  Patient was oriented to gym and equipment including functions, settings, policies, and procedures.  Patient's individual exercise prescription and treatment plan were reviewed.  All starting workloads were established based on the results of the 6 minute walk test done at initial orientation visit.  The plan for exercise progression was also introduced and progression will be customized based on patient's performance and goals.          Exercise Goals and Review: Exercise Goals    Row Name 11/28/19 1619             Exercise Goals   Increase Physical Activity  Yes       Intervention  Provide advice, education, support and counseling about physical activity/exercise needs.;Develop an individualized exercise prescription for  aerobic and resistive training based on initial evaluation findings, risk stratification, comorbidities and participant's personal goals.       Expected Outcomes  Short Term: Attend rehab on a regular basis to increase amount of physical activity.;Long Term: Add  in home exercise to make exercise part of routine and to increase amount of physical activity.;Long Term: Exercising regularly at least 3-5 days a week.       Increase Strength and Stamina  Yes       Intervention  Provide advice, education, support and counseling about physical activity/exercise needs.;Develop an individualized exercise prescription for aerobic and resistive training based on initial evaluation findings, risk stratification, comorbidities and participant's personal goals.       Expected Outcomes  Short Term: Increase workloads from initial exercise prescription for resistance, speed, and METs.;Short Term: Perform resistance training exercises routinely during rehab and add in resistance training at home;Long Term: Improve cardiorespiratory fitness, muscular endurance and strength as measured by increased METs and functional capacity (6MWT)       Able to understand and use rate of perceived exertion (RPE) scale  Yes       Intervention  Provide education and explanation on how to use RPE scale       Expected Outcomes  Short Term: Able to use RPE daily in rehab to express subjective intensity level;Long Term:  Able to use RPE to guide intensity level when exercising independently       Able to understand and use Dyspnea scale  Yes       Intervention  Provide education and explanation on how to use Dyspnea scale       Expected Outcomes  Short Term: Able to use Dyspnea scale daily in rehab to express subjective sense of shortness of breath during exertion;Long Term: Able to use Dyspnea scale to guide intensity level when exercising independently       Knowledge and understanding of Target Heart Rate Range (THRR)  Yes        Intervention  Provide education and explanation of THRR including how the numbers were predicted and where they are located for reference       Expected Outcomes  Short Term: Able to state/look up THRR;Short Term: Able to use daily as guideline for intensity in rehab;Long Term: Able to use THRR to govern intensity when exercising independently       Able to check pulse independently  Yes       Intervention  Provide education and demonstration on how to check pulse in carotid and radial arteries.;Review the importance of being able to check your own pulse for safety during independent exercise       Expected Outcomes  Short Term: Able to explain why pulse checking is important during independent exercise;Long Term: Able to check pulse independently and accurately       Understanding of Exercise Prescription  Yes       Intervention  Provide education, explanation, and written materials on patient's individual exercise prescription       Expected Outcomes  Short Term: Able to explain program exercise prescription;Long Term: Able to explain home exercise prescription to exercise independently       Improve claudication pain toleration; Improve walking ability  Yes       Intervention  Participate in PAD/SET Rehab 2-3 days a week, walking at home as part of exercise prescription;Attend education sessions to aid in risk factor modification and understanding of disease process       Expected Outcomes  Short Term: Improve walking distance/time to onset of claudication pain;Long Term: Improve walking ability and toleration to claudication;Long Term: Improve score of PAD questionnaires          Exercise Goals Re-Evaluation : Exercise Goals Re-Evaluation  [place] Name 12/01/19 1551 12/07/19 0920           Exercise Goal Re-Evaluation   Exercise Goals Review  Able to understand and use rate of perceived exertion (RPE) scale;Knowledge and understanding of Target Heart Rate Range (THRR);Able to check pulse  independently;Understanding of Exercise Prescription  --      Comments  Reviewed RPE scale, THR and program prescription with pt today.  Pt voiced understanding and was given a copy of goals to take home.  Izell [place] has moved up to level 3 on NS and Biostep.  He is working at correct THR range.      Expected Outcomes  Short: Use RPE daily to regulate intensity. Long: Follow program prescription in THR.  Short - attend HT consistently Long: improve MET level         Discharge Exercise Prescription (Final Exercise Prescription Changes): Exercise Prescription Changes - 12/07/19 0900      Response to Exercise   Blood Pressure (Admit)  120/60    Blood Pressure (Exercise)  116/62    Blood Pressure (Exit)  116/58    Heart Rate (Admit)  79 bpm    Heart Rate (Exercise)  115 bpm    Heart Rate (Exit)  89 bpm    Rating of Perceived Exertion (Exercise)  15    Comments  second day    Duration  Progress to 30 minutes of  aerobic without signs/symptoms of physical distress    Intensity  THRR unchanged      Progression   Progression  Continue to progress workloads to maintain intensity without signs/symptoms of physical distress.    Average METs  2      Resistance Training   Weight  3 lb    Reps  10-15      Interval Training   Interval Training  No      NuStep   Level  3    SPM  80    Minutes  15    METs  2.3      Biostep-RELP   Level  3    SPM  50    Minutes  15    METs  2       Nutrition:  Target Goals: Understanding of nutrition guidelines, daily intake of sodium <1540m, cholesterol <2032m calories 30% from fat and 7% or less from saturated fats, daily to have 5 or more servings of fruits and vegetables.  Biometrics: Pre Biometrics - 11/28/19 1620      Pre Biometrics   Height  5' 9.5" (1.765 m)    Weight  195 lb 11.2 oz (88.8 kg)    BMI (Calculated)  28.5    Single Leg Stand  8.82 seconds        Nutrition Therapy Plan and Nutrition Goals:   Nutrition  Assessments:   Nutrition Goals Re-Evaluation:   Nutrition Goals Discharge (Final Nutrition Goals Re-Evaluation):   Psychosocial: Target Goals: Acknowledge presence or absence of significant depression and/or stress, maximize coping skills, provide positive support system. Participant is able to verbalize types and ability to use techniques and skills needed for reducing stress and depression.   Initial Review & Psychosocial Screening: Initial Psych Review & Screening - 11/21/19 1415      Initial Review   Current issues with  Current Sleep Concerns;Current Stress Concerns    Source of Stress Concerns  Chronic Illness;Family    Comments  having to get up to use bathroom      Family  Dynamics   Good Support System?  Yes      Barriers   Psychosocial barriers to participate in program  There are no identifiable barriers or psychosocial needs.;The patient should benefit from training in stress management and relaxation.      Screening Interventions   Interventions  Encouraged to exercise;To provide support and resources with identified psychosocial needs;Provide feedback about the scores to participant    Expected Outcomes  Short Term goal: Utilizing psychosocial counselor, staff and physician to assist with identification of specific Stressors or current issues interfering with healing process. Setting desired goal for each stressor or current issue identified.;Long Term Goal: Stressors or current issues are controlled or eliminated.;Short Term goal: Identification and review with participant of any Quality of Life or Depression concerns found by scoring the questionnaire.;Long Term goal: The participant improves quality of Life and PHQ9 Scores as seen by post scores and/or verbalization of changes       Quality of Life Scores:   Scores of 19 and below usually indicate a poorer quality of life in these areas.  A difference of  2-3 points is a clinically meaningful difference.  A  difference of 2-3 points in the total score of the Quality of Life Index has been associated with significant improvement in overall quality of life, self-image, physical symptoms, and general health in studies assessing change in quality of life.  PHQ-9: Recent Review Flowsheet Data    Depression screen [facility] 2/9 11/28/2019 09/13/2018 08/24/2017 08/21/2016 08/14/2015   Decreased Interest 0 0 0 0 0   Down, Depressed, Hopeless 0 0 0 0 0   PHQ - 2 Score 0 0 0 0 0   Altered sleeping 0 - - - -   Tired, decreased energy 3 - - - -   Change in appetite 0 - - - -   Feeling bad or failure about yourself  0 - - - -   Trouble concentrating 0 - - - -   Moving slowly or fidgety/restless 0 - - - -   Suicidal thoughts 0 - - - -   PHQ-9 Score 3 - - - -   Difficult doing work/chores Somewhat difficult - - - -     Interpretation of Total Score  Total Score Depression Severity:  1-4 = Minimal depression, 5-9 = Mild depression, 10-14 = Moderate depression, 15-19 = Moderately severe depression, 20-27 = Severe depression   Psychosocial Evaluation and Intervention: Psychosocial Evaluation - 11/21/19 1422      Psychosocial Evaluation & Interventions   Comments  Izell [place] 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 He has a good support system. He helps care for his wife who had a stroke recently, so that keeps him busy. Taking time for himself may be difficult, but he wants to take care of himself for her. He does have a $20 copay but he is going to try to figure something out    Expected Outcomes  Short: attend cardiac rehab as much as possible. Long: develop positive self care habits.    Continue Psychosocial Services   Follow up required by staff       Psychosocial Re-Evaluation:   Psychosocial Discharge (Final Psychosocial Re-Evaluation):   Vocational Rehabilitation: Provide vocational rehab assistance to qualifying candidates.   Vocational Rehab Evaluation & Intervention: Vocational Rehab - 11/21/19 1422       Initial Vocational Rehab Evaluation & Intervention   Assessment shows need for Vocational Rehabilitation  No  Education: Education Goals: Education classes will be provided on a variety of topics geared toward better understanding of heart health and risk factor modification. Participant will state understanding/return demonstration of topics presented as noted by education test scores.  Learning Barriers/Preferences: Learning Barriers/Preferences - 11/21/19 1422      Learning Barriers/Preferences   Learning Barriers  Hearing    Learning Preferences  None       Education Topics:  AED/CPR: - Group verbal and written instruction with the use of models to demonstrate the basic use of the AED with the basic ABC's of resuscitation.   General Nutrition Guidelines/Fats and Fiber: -Group instruction provided by verbal, written material, models and posters to present the general guidelines for heart healthy nutrition. Gives an explanation and review of dietary fats and fiber.   Controlling Sodium/Reading Food Labels: -Group verbal and written material supporting the discussion of sodium use in heart healthy nutrition. Review and explanation with models, verbal and written materials for utilization of the food label.   Exercise Physiology & General Exercise Guidelines: - Group verbal and written instruction with models to review the exercise physiology of the cardiovascular system and associated critical values. Provides general exercise guidelines with specific guidelines to those with heart or lung disease.    Aerobic Exercise & Resistance Training: - Gives group verbal and written instruction on the various components of exercise. Focuses on aerobic and resistive training programs and the benefits of this training and how to safely progress through these programs..   Flexibility, Balance, Mind/Body Relaxation: Provides group verbal/written instruction on the benefits of  flexibility and balance training, including mind/body exercise modes such as yoga, pilates and tai chi.  Demonstration and skill practice provided.   Stress and Anxiety: - Provides group verbal and written instruction about the health risks of elevated stress and causes of high stress.  Discuss the correlation between heart/lung disease and anxiety and treatment options. Review healthy ways to manage with stress and anxiety.   Depression: - Provides group verbal and written instruction on the correlation between heart/lung disease and depressed mood, treatment options, and the stigmas associated with seeking treatment.   Anatomy & Physiology of the Heart: - Group verbal and written instruction and models provide basic cardiac anatomy and physiology, with the coronary electrical and arterial systems. Review of Valvular disease and Heart Failure   Cardiac Procedures: - Group verbal and written instruction to review commonly prescribed medications for heart disease. Reviews the medication, class of the drug, and side effects. Includes the steps to properly store meds and maintain the prescription regimen. (beta blockers and nitrates)   Cardiac Medications I: - Group verbal and written instruction to review commonly prescribed medications for heart disease. Reviews the medication, class of the drug, and side effects. Includes the steps to properly store meds and maintain the prescription regimen.   Cardiac Medications II: -Group verbal and written instruction to review commonly prescribed medications for heart disease. Reviews the medication, class of the drug, and side effects. (all other drug classes)    Go Sex-Intimacy & Heart Disease, Get SMART - Goal Setting: - Group verbal and written instruction through game format to discuss heart disease and the return to sexual intimacy. Provides group verbal and written material to discuss and apply goal setting through the application of the  S.M.A.R.T. Method.   Other Matters of the Heart: - Provides group verbal, written materials and models to describe Stable Angina and Peripheral Artery. Includes description of the disease process  and treatment options available to the cardiac patient.   Exercise & Equipment Safety: - Individual verbal instruction and demonstration of equipment use and safety with use of the equipment.   Cardiac Rehab from 11/28/2019 in [facility] Cardiac and Pulmonary Rehab  Date  11/28/19  Educator  AS  Instruction Review Code  1- Verbalizes Understanding      Infection Prevention: - Provides verbal and written material to individual with discussion of infection control including proper hand washing and proper equipment cleaning during exercise session.   Cardiac Rehab from 11/28/2019 in [facility] Cardiac and Pulmonary Rehab  Date  11/28/19  Educator  AS  Instruction Review Code  1- Verbalizes Understanding      Falls Prevention: - Provides verbal and written material to individual with discussion of falls prevention and safety.   Cardiac Rehab from 11/28/2019 in [facility] Cardiac and Pulmonary Rehab  Date  11/28/19  Educator  AS  Instruction Review Code  1- Verbalizes Understanding      Diabetes: - Individual verbal and written instruction to review signs/symptoms of diabetes, desired ranges of glucose level fasting, after meals and with exercise. Acknowledge that pre and post exercise glucose checks will be done for 3 sessions at entry of program.   Know Your Numbers and Risk Factors: -Group verbal and written instruction about important numbers in your health.  Discussion of what are risk factors and how they play a role in the disease process.  Review of Cholesterol, Blood Pressure, Diabetes, and BMI and the role they play in your overall health.   Sleep Hygiene: -Provides group verbal and written instruction about how sleep can affect your health.  Define sleep hygiene, discuss sleep cycles and impact of  sleep habits. Review good sleep hygiene tips.    Other: -Provides group and verbal instruction on various topics (see comments)   Knowledge Questionnaire Score:   Core Components/Risk Factors/Patient Goals at Admission: Personal Goals and Risk Factors at Admission - 11/28/19 1625      Core Components/Risk Factors/Patient Goals on Admission    Weight Management  Yes    Intervention  Weight Management: Develop a combined nutrition and exercise program designed to reach desired caloric intake, while maintaining appropriate intake of nutrient and fiber, sodium and fats, and appropriate energy expenditure required for the weight goal.;Weight Management: Provide education and appropriate resources to help participant work on and attain dietary goals.    Admit Weight  195 lb 11.2 oz (88.8 kg)       Core Components/Risk Factors/Patient Goals Review:    Core Components/Risk Factors/Patient Goals at Discharge (Final Review):    ITP Comments: ITP Comments    Row Name 11/21/19 1420 12/01/19 1551 12/14/19 0635       ITP Comments  Initial orientation compelted. Diagnosis can be found in [facility] 11/22. EP orientation scheduled fro 2/8 at 3pm  First full day of exercise!  Patient was oriented to gym and equipment including functions, settings, policies, and procedures.  Patient's individual exercise prescription and treatment plan were reviewed.  All starting workloads were established based on the results of the 6 minute walk test done at initial orientation visit.  The plan for exercise progression was also introduced and progression will be customized based on patient's performance and goals.  30 day chart review completed. ITP sent to Dr Zachery Dakins Medical Director, for review,changes as needed and signature. New to program        Comments:

## 2019-12-15 ENCOUNTER — Other Ambulatory Visit: Payer: Self-pay

## 2019-12-15 ENCOUNTER — Encounter: Payer: Medicare PPO | Admitting: *Deleted

## 2019-12-15 DIAGNOSIS — I214 Non-ST elevation (NSTEMI) myocardial infarction: Secondary | ICD-10-CM | POA: Diagnosis not present

## 2019-12-15 DIAGNOSIS — Z951 Presence of aortocoronary bypass graft: Secondary | ICD-10-CM

## 2019-12-15 NOTE — Progress Notes (Signed)
Daily Session Note  Patient Details  Name: Brandon Lewis MRN: [id number] Date of Birth: 1934-10-09 Referring Provider:     Cardiac Rehab from 11/28/2019 in [facility] Cardiac and Pulmonary Rehab  Referring Provider  Brandon Lewis      Encounter Date: 12/15/2019  Check In: Session Check In - 12/15/19 1500      Check-In   Supervising physician immediately available to respond to emergencies  See telemetry face sheet for immediately available ER MD    Location  [facility]-Cardiac & Pulmonary Rehab    Staff Present  Nada Maclachlan, BA, ACSM CEP, Exercise Physiologist;Jessica Luan Pulling, MA, RCEP, CCRP, CCET;Shelsy Seng Sherryll Burger, RN BSN    Virtual Visit  No    Medication changes reported      No    Fall or balance concerns reported     No    Warm-up and Cool-down  Performed on first and last piece of equipment    Resistance Training Performed  Yes    VAD Patient?  No    PAD/SET Patient?  No      Pain Assessment   Currently in Pain?  No/denies          Social History   Tobacco Use  Smoking Status Former Smoker  . Types: Cigarettes  . Quit date: 10/20/1992  . Years since quitting: 27.1  Smokeless Tobacco Never Used  Tobacco Comment   quit in 1994     Goals Met:  Independence with exercise equipment Exercise tolerated well No report of cardiac concerns or symptoms Strength training completed today  Goals Unmet:  Not Applicable  Comments: Pt able to follow exercise prescription today without complaint.  Will continue to monitor for progression.    Dr. Emily Lewis is Medical Director for [place] and [facility].

## 2019-12-16 DIAGNOSIS — I10 Essential (primary) hypertension: Secondary | ICD-10-CM | POA: Diagnosis not present

## 2019-12-16 DIAGNOSIS — M5431 Sciatica, right side: Secondary | ICD-10-CM | POA: Diagnosis not present

## 2019-12-16 DIAGNOSIS — Z87891 Personal history of nicotine dependence: Secondary | ICD-10-CM

## 2019-12-16 DIAGNOSIS — I4892 Unspecified atrial flutter: Secondary | ICD-10-CM | POA: Diagnosis not present

## 2019-12-16 DIAGNOSIS — M6281 Muscle weakness (generalized): Secondary | ICD-10-CM | POA: Diagnosis not present

## 2019-12-16 DIAGNOSIS — I129 Hypertensive chronic kidney disease with stage 1 through stage 4 chronic kidney disease, or unspecified chronic kidney disease: Secondary | ICD-10-CM | POA: Diagnosis not present

## 2019-12-16 DIAGNOSIS — Z7982 Long term (current) use of aspirin: Secondary | ICD-10-CM

## 2019-12-16 DIAGNOSIS — I214 Non-ST elevation (NSTEMI) myocardial infarction: Secondary | ICD-10-CM | POA: Diagnosis not present

## 2019-12-16 DIAGNOSIS — Z7902 Long term (current) use of antithrombotics/antiplatelets: Secondary | ICD-10-CM

## 2019-12-16 DIAGNOSIS — I2510 Atherosclerotic heart disease of native coronary artery without angina pectoris: Secondary | ICD-10-CM | POA: Diagnosis not present

## 2019-12-16 DIAGNOSIS — R2689 Other abnormalities of gait and mobility: Secondary | ICD-10-CM | POA: Diagnosis not present

## 2019-12-16 DIAGNOSIS — N183 Chronic kidney disease, stage 3 unspecified: Secondary | ICD-10-CM | POA: Diagnosis not present

## 2019-12-16 DIAGNOSIS — Z951 Presence of aortocoronary bypass graft: Secondary | ICD-10-CM

## 2019-12-19 ENCOUNTER — Other Ambulatory Visit: Payer: Self-pay

## 2019-12-19 ENCOUNTER — Encounter: Payer: Medicare PPO | Attending: Internal Medicine | Admitting: *Deleted

## 2019-12-19 DIAGNOSIS — I214 Non-ST elevation (NSTEMI) myocardial infarction: Secondary | ICD-10-CM | POA: Diagnosis not present

## 2019-12-19 DIAGNOSIS — Z951 Presence of aortocoronary bypass graft: Secondary | ICD-10-CM | POA: Insufficient documentation

## 2019-12-19 NOTE — Progress Notes (Signed)
Daily Session Note  Patient Details  Name: Brandon Lewis MRN: [id number] Date of Birth: 13-May-1934 Referring Provider:     Cardiac Rehab from 11/28/2019 in [facility] Cardiac and Pulmonary Rehab  Referring Provider  [name]      Encounter Date: 12/19/2019  Check In: Session Check In - 12/19/19 1539      Check-In   Supervising physician immediately available to respond to emergencies  See telemetry face sheet for immediately available ER MD    Location  [facility]-Cardiac & Pulmonary Rehab    Staff Present  Renita Papa, RN Moises Blood, BS, ACSM CEP, Exercise Physiologist;Joseph Tessie Fass RCP,RRT,BSRT    Virtual Visit  No    Medication changes reported      No    Fall or balance concerns reported     No    Warm-up and Cool-down  Performed on first and last piece of equipment    Resistance Training Performed  Yes    VAD Patient?  No    PAD/SET Patient?  No      Pain Assessment   Currently in Pain?  No/denies          Social History   Tobacco Use  Smoking Status Former Smoker  . Types: Cigarettes  . Quit date: 10/20/1992  . Years since quitting: 27.1  Smokeless Tobacco Never Used  Tobacco Comment   quit in 1994     Goals Met:  Independence with exercise equipment Exercise tolerated well No report of cardiac concerns or symptoms Strength training completed today  Goals Unmet:  Not Applicable  Comments: Pt able to follow exercise prescription today without complaint.  Will continue to monitor for progression.    Dr. Emily Filbert is Medical Director for [place] and [facility].

## 2019-12-21 ENCOUNTER — Encounter: Payer: Medicare PPO | Admitting: *Deleted

## 2019-12-21 ENCOUNTER — Other Ambulatory Visit: Payer: Self-pay

## 2019-12-21 DIAGNOSIS — Z951 Presence of aortocoronary bypass graft: Secondary | ICD-10-CM

## 2019-12-21 DIAGNOSIS — I214 Non-ST elevation (NSTEMI) myocardial infarction: Secondary | ICD-10-CM

## 2019-12-21 NOTE — Progress Notes (Signed)
Daily Session Note  Patient Details  Name: Brandon Lewis MRN: [id number] Date of Birth: 04-25-1934 Referring Provider:     Cardiac Rehab from 11/28/2019 in [facility] Cardiac and Pulmonary Rehab  Referring Provider  [name]      Encounter Date: 12/21/2019  Check In: Session Check In - 12/21/19 1523      Check-In   Supervising physician immediately available to respond to emergencies  See telemetry face sheet for immediately available ER MD    Location  [facility]-Cardiac & Pulmonary Rehab    Staff Present  Renita Papa, RN Vickki Hearing, BA, ACSM CEP, Exercise Physiologist;Melissa Caiola RDN, LDN    Virtual Visit  No    Medication changes reported      No    Fall or balance concerns reported     No    Warm-up and Cool-down  Performed on first and last piece of equipment    Resistance Training Performed  Yes    VAD Patient?  No    PAD/SET Patient?  No      Pain Assessment   Currently in Pain?  No/denies          Social History   Tobacco Use  Smoking Status Former Smoker  . Types: Cigarettes  . Quit date: 10/20/1992  . Years since quitting: 27.1  Smokeless Tobacco Never Used  Tobacco Comment   quit in 1994     Goals Met:  Independence with exercise equipment Exercise tolerated well No report of cardiac concerns or symptoms Strength training completed today  Goals Unmet:  Not Applicable  Comments: Pt able to follow exercise prescription today without complaint.  Will continue to monitor for progression.    Dr. Emily Filbert is Medical Director for [place] and [facility].

## 2019-12-22 ENCOUNTER — Encounter: Payer: Medicare PPO | Admitting: *Deleted

## 2019-12-22 ENCOUNTER — Other Ambulatory Visit: Payer: Self-pay

## 2019-12-22 DIAGNOSIS — I214 Non-ST elevation (NSTEMI) myocardial infarction: Secondary | ICD-10-CM

## 2019-12-22 DIAGNOSIS — Z951 Presence of aortocoronary bypass graft: Secondary | ICD-10-CM

## 2019-12-22 NOTE — Progress Notes (Signed)
Daily Session Note  Patient Details  Name: Brandon Lewis MRN: [id number] Date of Birth: Jun 07, 1934 Referring Provider:     Cardiac Rehab from 11/28/2019 in [facility] Cardiac and Pulmonary Rehab  Referring Provider  [name]      Encounter Date: 12/22/2019  Check In: Session Check In - 12/22/19 1508      Check-In   Supervising physician immediately available to respond to emergencies  See telemetry face sheet for immediately available ER MD    Location  [facility]-Cardiac & Pulmonary Rehab    Staff Present  Renita Papa, RN BSN;Joseph [address] [place], [place], [place], CCRP, CCET    Virtual Visit  No    Medication changes reported      No    Fall or balance concerns reported     No    Warm-up and Cool-down  Performed on first and last piece of equipment    Resistance Training Performed  Yes    VAD Patient?  No    PAD/SET Patient?  No      Pain Assessment   Currently in Pain?  No/denies          Social History   Tobacco Use  Smoking Status Former Smoker  . Types: Cigarettes  . Quit date: 10/20/1992  . Years since quitting: 27.1  Smokeless Tobacco Never Used  Tobacco Comment   quit in 1994     Goals Met:  Independence with exercise equipment Exercise tolerated well No report of cardiac concerns or symptoms Strength training completed today  Goals Unmet:  Not Applicable  Comments: Pt able to follow exercise prescription today without complaint.  Will continue to monitor for progression.  Reviewed home exercise with pt today.  Pt plans to walk and use stepper at home for exercise.  Reviewed THR, pulse, RPE, sign and symptoms, NTG use, and when to call 911 or MD.  Also discussed weather considerations and indoor options.  Pt voiced understanding.   Dr. Emily Filbert is Medical Director for [place] and [facility].

## 2019-12-26 ENCOUNTER — Other Ambulatory Visit: Payer: Self-pay

## 2019-12-26 ENCOUNTER — Encounter: Payer: Medicare PPO | Admitting: *Deleted

## 2019-12-26 DIAGNOSIS — I214 Non-ST elevation (NSTEMI) myocardial infarction: Secondary | ICD-10-CM

## 2019-12-26 DIAGNOSIS — Z951 Presence of aortocoronary bypass graft: Secondary | ICD-10-CM

## 2019-12-26 NOTE — Progress Notes (Signed)
Daily Session Note  Patient Details  Name: Brandon Lewis MRN: [id number] Date of Birth: May 02, 1934 Referring Provider:     Cardiac Rehab from 11/28/2019 in [facility] Cardiac and Pulmonary Rehab  Referring Provider  [name]      Encounter Date: 12/26/2019  Check In: Session Check In - 12/26/19 1505      Check-In   Supervising physician immediately available to respond to emergencies  See telemetry face sheet for immediately available ER MD    Location  [facility]-Cardiac & Pulmonary Rehab    Staff Present  Justin Mend Jaci Carrel, BS, ACSM CEP, Exercise Physiologist;Morrison Masser Sherryll Burger, RN BSN    Virtual Visit  No    Medication changes reported      No    Fall or balance concerns reported     No    Warm-up and Cool-down  Performed on first and last piece of equipment    Resistance Training Performed  Yes    VAD Patient?  No    PAD/SET Patient?  No      Pain Assessment   Currently in Pain?  No/denies          Social History   Tobacco Use  Smoking Status Former Smoker  . Types: Cigarettes  . Quit date: 10/20/1992  . Years since quitting: 27.2  Smokeless Tobacco Never Used  Tobacco Comment   quit in 1994     Goals Met:  Independence with exercise equipment Exercise tolerated well No report of cardiac concerns or symptoms Strength training completed today  Goals Unmet:  Not Applicable  Comments: Pt able to follow exercise prescription today without complaint.  Will continue to monitor for progression.    Dr. Emily Filbert is Medical Director for [place] and [facility].

## 2019-12-28 ENCOUNTER — Other Ambulatory Visit: Payer: Self-pay

## 2019-12-28 DIAGNOSIS — Z951 Presence of aortocoronary bypass graft: Secondary | ICD-10-CM

## 2019-12-28 NOTE — Progress Notes (Signed)
Completed Initial RD Eval 

## 2019-12-29 ENCOUNTER — Encounter: Payer: Medicare PPO | Admitting: *Deleted

## 2019-12-29 DIAGNOSIS — Z951 Presence of aortocoronary bypass graft: Secondary | ICD-10-CM | POA: Diagnosis not present

## 2019-12-29 DIAGNOSIS — I214 Non-ST elevation (NSTEMI) myocardial infarction: Secondary | ICD-10-CM

## 2019-12-29 NOTE — Progress Notes (Signed)
Daily Session Note  Patient Details  Name: BREYDEN JEUDY MRN: [id number] Date of Birth: 1933-11-09 Referring Provider:     Cardiac Rehab from 11/28/2019 in [facility] Cardiac and Pulmonary Rehab  Referring Provider  [name]      Encounter Date: 12/29/2019  Check In: Session Check In - 12/29/19 1505      Check-In   Supervising physician immediately available to respond to emergencies  See telemetry face sheet for immediately available ER MD    Location  [facility]-Cardiac & Pulmonary Rehab    Staff Present  Renita Papa, RN BSN;Joseph [address] [place], [place], [place], CCRP, CCET    Virtual Visit  No    Medication changes reported      No    Fall or balance concerns reported     No    Warm-up and Cool-down  Performed on first and last piece of equipment    Resistance Training Performed  Yes    VAD Patient?  No    PAD/SET Patient?  No      Pain Assessment   Currently in Pain?  No/denies          Social History   Tobacco Use  Smoking Status Former Smoker  . Types: Cigarettes  . Quit date: 10/20/1992  . Years since quitting: 27.2  Smokeless Tobacco Never Used  Tobacco Comment   quit in 1994     Goals Met:  Independence with exercise equipment Exercise tolerated well No report of cardiac concerns or symptoms Strength training completed today  Goals Unmet:  Not Applicable  Comments: Pt able to follow exercise prescription today without complaint.  Will continue to monitor for progression.    Dr. Emily Filbert is Medical Director for [place] and [facility].

## 2020-01-04 ENCOUNTER — Encounter: Payer: Medicare PPO | Admitting: *Deleted

## 2020-01-04 ENCOUNTER — Other Ambulatory Visit: Payer: Self-pay

## 2020-01-04 DIAGNOSIS — I214 Non-ST elevation (NSTEMI) myocardial infarction: Secondary | ICD-10-CM

## 2020-01-04 DIAGNOSIS — Z951 Presence of aortocoronary bypass graft: Secondary | ICD-10-CM | POA: Diagnosis not present

## 2020-01-04 NOTE — Progress Notes (Signed)
Daily Session Note  Patient Details  Name: Brandon Lewis MRN: [id number] Date of Birth: [birth year] Referring Provider:     Cardiac Rehab from 11/28/2019 in [facility] Cardiac and Pulmonary Rehab  Referring Provider  [name]      Encounter Date: 01/04/2020  Check In: Session Check In - 01/04/20 1506      Check-In   Location  [facility]-Cardiac & Pulmonary Rehab    Staff Present  Renita Papa, RN BSN;Joseph Hood RCP,RRT,BSRT;Melissa [place] RDN, LDN    Virtual Visit  No    Medication changes reported      No    Fall or balance concerns reported     No    Resistance Training Performed  Yes    VAD Patient?  No    PAD/SET Patient?  No      Pain Assessment   Currently in Pain?  No/denies          Social History   Tobacco Use  Smoking Status Former Smoker  . Types: Cigarettes  . Quit date: 10/20/1992  . Years since quitting: 27.2  Smokeless Tobacco Never Used  Tobacco Comment   quit in 1994     Goals Met:  Independence with exercise equipment Exercise tolerated well No report of cardiac concerns or symptoms Strength training completed today  Goals Unmet:  Not Applicable  Comments: Pt able to follow exercise prescription today without complaint.  Will continue to monitor for progression.    Dr. Emily Filbert is Medical Director for [place] and [facility].

## 2020-01-05 ENCOUNTER — Other Ambulatory Visit: Payer: Self-pay

## 2020-01-05 ENCOUNTER — Encounter: Payer: Medicare PPO | Admitting: *Deleted

## 2020-01-05 DIAGNOSIS — I214 Non-ST elevation (NSTEMI) myocardial infarction: Secondary | ICD-10-CM

## 2020-01-05 DIAGNOSIS — Z951 Presence of aortocoronary bypass graft: Secondary | ICD-10-CM

## 2020-01-05 NOTE — Progress Notes (Signed)
Daily Session Note  Patient Details  Name: Brandon Lewis MRN: [id number] Date of Birth: 09/12/1934 Referring Provider:     Cardiac Rehab from 11/28/2019 in [facility] Cardiac and Pulmonary Rehab  Referring Provider  [place]      Encounter Date: 01/05/2020  Check In: Session Check In - 01/05/20 1506      Check-In   Supervising physician immediately available to respond to emergencies  See telemetry face sheet for immediately available ER MD    Location  [facility]-Cardiac & Pulmonary Rehab    Staff Present   , RN BSN;Joseph Hood RCP,RRT,BSRT;Jessica Hawkins, MA, RCEP, CCRP, CCET    Virtual Visit  No    Medication changes reported      No    Fall or balance concerns reported     No    Warm-up and Cool-down  Performed on first and last piece of equipment    Resistance Training Performed  Yes    VAD Patient?  No    PAD/SET Patient?  No      Pain Assessment   Currently in Pain?  No/denies          Social History   Tobacco Use  Smoking Status Former Smoker  . Types: Cigarettes  . Quit date: 10/20/1992  . Years since quitting: 27.2  Smokeless Tobacco Never Used  Tobacco Comment   quit in 1994     Goals Met:  Independence with exercise equipment Exercise tolerated well No report of cardiac concerns or symptoms Strength training completed today  Goals Unmet:  Not Applicable  Comments: Pt able to follow exercise prescription today without complaint.  Will continue to monitor for progression.    Dr. Mark Miller is Medical Director for [facility] and [facility]. 

## 2020-01-10 ENCOUNTER — Other Ambulatory Visit: Payer: Self-pay | Admitting: Internal Medicine

## 2020-01-11 ENCOUNTER — Encounter: Payer: Medicare PPO | Admitting: *Deleted

## 2020-01-11 ENCOUNTER — Other Ambulatory Visit: Payer: Self-pay

## 2020-01-11 ENCOUNTER — Encounter: Payer: Self-pay | Admitting: *Deleted

## 2020-01-11 DIAGNOSIS — Z951 Presence of aortocoronary bypass graft: Secondary | ICD-10-CM

## 2020-01-11 DIAGNOSIS — I214 Non-ST elevation (NSTEMI) myocardial infarction: Secondary | ICD-10-CM

## 2020-01-11 NOTE — Progress Notes (Signed)
Cardiac Individual Treatment Plan  Patient Details  Name: TALAN GILDNER MRN: [id number] Date of Birth: 1933/11/25 Referring Provider:     Cardiac Rehab from 11/28/2019 in [facility] Cardiac and Pulmonary Rehab  Referring Provider  [name]      Initial Encounter Date:    Cardiac Rehab from 11/28/2019 in [facility] Cardiac and Pulmonary Rehab  Date  11/28/19      Visit Diagnosis: S/P CABG x 3  NSTEMI (non-ST elevated myocardial infarction) ([place])  Patient's Home Medications on Admission:  Current Outpatient Medications:  .  amiodarone (PACERONE) 200 MG tablet, Take 1 tablet (200 mg total) by mouth 2 (two) times daily. Take one tablet by mouth TWICE daily for 7 days  then take one tablet by mouth ONCE daily., Disp: 60 tablet, Rfl: 1 .  aspirin 81 MG chewable tablet, Chew 1 tablet (81 mg total) by mouth daily., Disp:  , Rfl:  .  cholecalciferol (VITAMIN D) 1000 UNITS tablet, Take 1,000 Units by mouth at bedtime. , Disp: , Rfl:  .  clopidogrel (PLAVIX) 75 MG tablet, TAKE ONE TABLET BY MOUTH EVERY DAY *BOTTLE*, Disp: 90 tablet, Rfl: 3 .  Magnesium 400 MG CAPS, Take by mouth daily., Disp: , Rfl:  .  metoprolol succinate (TOPROL-XL) 50 MG 24 hr tablet, **VIAL ONLY** TAKE (1) TABLET BY MOUTH ONCE A DAY.DO NOT CRUSH. (HIGHBLOOD PRESSURE) *BOTTLE*, Disp: 30 tablet, Rfl: 11 .  Multiple Vitamin (MULTIVITAMIN WITH MINERALS) TABS tablet, Take 1 tablet by mouth every morning., Disp: , Rfl:  .  rosuvastatin (CRESTOR) 10 MG tablet, **VIAL ONLY** TAKE ONE TABLET BY MOUTH AT BEDTIME. (IMPROVES CHOLESTEROL), Disp: 90 tablet, Rfl: 3 .  sodium chloride (BRONCHO SALINE) inhaler solution, Take 1 spray by nebulization daily as needed (congestion)., Disp: , Rfl:  .  tamsulosin (FLOMAX) 0.4 MG CAPS capsule, TAKE 1 CAPSULE BY MOUTH ONCE DAILY, Disp: 30 capsule, Rfl: 11  Current Facility-Administered Medications:  .  mometasone-formoterol (DULERA) 100-5 MCG/ACT inhaler 2 puff, 2 puff, Inhalation, BID, Atkins, [place]ice [place],  MD  Past Medical History: Past Medical History:  Diagnosis Date  . CAD (coronary artery disease)    s/p PCI after presenting with exertional angina (drug eluting stent)  . Diverticulosis   . GERD (gastroesophageal reflux disease)   . HTN (hypertension)   . Hyperlipidemia   . Osteoarthritis   . PVD (peripheral vascular disease) ([place])    s/p aortobifemoral bypass, with bilateral SFA occlusion and intermittent claudication   . Small bowel obstruction (HCC)     Tobacco Use: Social History   Tobacco Use  Smoking Status Former Smoker  . Types: Cigarettes  . Quit date: 10/20/1992  . Years since quitting: 27.2  Smokeless Tobacco Never Used  Tobacco Comment   quit in 1994     Labs: Recent Review Flowsheet Data    Labs for ITP Cardiac and Pulmonary Rehab Latest Ref Rng & Units 09/16/2019 09/16/2019 09/16/2019 09/16/2019 09/16/2019   Cholestrol 0 - 200 mg/dL - - - - -   LDLCALC 0 - 99 mg/dL - - - - -   LDLDIRECT mg/dL - - - - -   HDL >40 mg/dL - - - - -   Trlycerides <150 mg/dL - - - - -   Hemoglobin A1c 4.8 - 5.6 % - - - - -   PHART 7.350 - 7.450 - - 7.313(L) 7.330(L) 7.313(L)   PCO2ART 32.0 - 48.0 mmHg - - 42.7 29.1(L) 31.9(L)   HCO3 20.0 - 28.0 mmol/L - -  21.7 15.4(L) 16.2(L)   TCO2 22 - 32 mmol/L 25 23 23  16(L) 17(L)   ACIDBASEDEF 0.0 - 2.0 mmol/L - - 4.0(H) 10.0(H) 9.0(H)   O2SAT % - - 99.0 91.0 96.0       Exercise Target Goals: Exercise Program Goal: Individual exercise prescription set using results from initial 6 min walk test and THRR while considering  patient's activity barriers and safety.   Exercise Prescription Goal: Initial exercise prescription builds to 30-45 minutes a day of aerobic activity, 2-3 days per week.  Home exercise guidelines will be given to patient during program as part of exercise prescription that the participant will acknowledge.  Activity Barriers & Risk Stratification: Activity Barriers & Cardiac Risk Stratification - 11/21/19 1415       Activity Barriers & Cardiac Risk Stratification   Activity Barriers  Other (comment);Back Problems    Comments  claudication pain    Cardiac Risk Stratification  High       6 Minute Walk: 6 Minute Walk    Row Name 11/28/19 1609         6 Minute Walk   Phase  Discharge     Distance  610 feet     Walk Time  6 minutes     # of Rest Breaks  0     MPH  1.15     METS  1     RPE  15     Perceived Dyspnea   3     VO2 Peak  3.53     Symptoms  Yes (comment)     Comments  claudication leg pain 6/10 ( normal for him walking)     Resting HR  79 bpm     Resting BP  122/56     Resting Oxygen Saturation   97 %     Exercise Oxygen Saturation  during 6 min walk  99 %     Max Ex. HR  102 bpm     Max Ex. BP  134/54     2 Minute Post BP  130/56        Oxygen Initial Assessment:   Oxygen Re-Evaluation:   Oxygen Discharge (Final Oxygen Re-Evaluation):   Initial Exercise Prescription: Initial Exercise Prescription - 11/28/19 1600      Date of Initial Exercise RX and Referring Provider   Date  11/28/19    Referring Provider  [name]      Treadmill   MPH  1    Grade  0    Minutes  15    METs  1.77      NuStep   Level  1    SPM  80    Minutes  15    METs  1      Recumbant Elliptical   Level  1    RPM  50    Minutes  15    METs  1      REL-XR   Level  1    Speed  50    Minutes  15    METs  1      Prescription Details   Frequency (times per week)  3    Duration  Progress to 30 minutes of continuous aerobic without signs/symptoms of physical distress      Intensity   THRR 40-80% of Max Heartrate  102-125    Ratings of Perceived Exertion  11-13    Perceived Dyspnea  0-4  Perform Capillary Blood Glucose checks as needed.  Exercise Prescription Changes: Exercise Prescription Changes    Row Name 11/28/19 1600 12/07/19 0900 12/21/19 1400 12/22/19 1500 01/05/20 0900     Response to Exercise   Blood Pressure (Admit)  122/56  120/60  128/74  --  110/60    Blood Pressure (Exercise)  134/54  116/62  136/70  --  140/64   Blood Pressure (Exit)  130/56  116/58  132/70  --  116/58   Heart Rate (Admit)  79 bpm  79 bpm  88 bpm  --  86 bpm   Heart Rate (Exercise)  102 bpm  115 bpm  91 bpm  --  99 bpm   Heart Rate (Exit)  86 bpm  89 bpm  83 bpm  --  85 bpm   Oxygen Saturation (Admit)  97 %  --  --  --  --   Oxygen Saturation (Exercise)  99 %  --  --  --  --   Rating of Perceived Exertion (Exercise)  15  15  14   --  15   Perceived Dyspnea (Exercise)  3  --  --  --  --   Symptoms  claudication leg pain  --  fatigue  --  --   Comments  --  second day  --  --  --   Duration  --  Progress to 30 minutes of  aerobic without signs/symptoms of physical distress  Progress to 30 minutes of  aerobic without signs/symptoms of physical distress  --  Continue with 30 min of aerobic exercise without signs/symptoms of physical distress.   Intensity  --  THRR unchanged  THRR unchanged  --  THRR unchanged     Progression   Progression  --  Continue to progress workloads to maintain intensity without signs/symptoms of physical distress.  Continue to progress workloads to maintain intensity without signs/symptoms of physical distress.  --  Continue to progress workloads to maintain intensity without signs/symptoms of physical distress.   Average METs  --  2  1.8  --  2     Resistance Training   Weight  --  3 lb  3 lb  --  3 lb   Reps  --  10-15  10-15  --  10-15     Interval Training   Interval Training  --  No  No  --  No     Treadmill   MPH  --  --  0.8  --  --   Grade  --  --  15  --  --   Minutes  --  --  10  --  --   METs  --  --  1.6  --  --     Recumbant Bike   Level  --  --  --  --  3   RPM  --  --  --  --  60   Minutes  --  --  --  --  15   METs  --  --  --  --  1.8     NuStep   Level  --  3  --  --  --   SPM  --  80  --  --  --   Minutes  --  15  --  --  --   METs  --  2.3  --  --  --     Arm Ergometer   Level  --  --  --  --  1   Minutes   --  --  --  --  15   METs  --  --  --  --  2.1     T5 Nustep   Level  --  --  3  --  --   Minutes  --  --  15  --  --   METs  --  --  1.8  --  --     Biostep-RELP   Level  --  3  --  --  --   SPM  --  50  --  --  --   Minutes  --  15  --  --  --   METs  --  2  --  --  --     Home Exercise Plan   Plans to continue exercise at  --  --  --  Home (comment) walk and stepper  Home (comment) walk and stepper   Frequency  --  --  --  Add 2 additional days to program exercise sessions.  Add 2 additional days to program exercise sessions.   Initial Home Exercises Provided  --  --  --  12/22/19  12/22/19      Exercise Comments: Exercise Comments    Row Name 12/01/19 1551           Exercise Comments  First full day of exercise!  Patient was oriented to gym and equipment including functions, settings, policies, and procedures.  Patient's individual exercise prescription and treatment plan were reviewed.  All starting workloads were established based on the results of the 6 minute walk test done at initial orientation visit.  The plan for exercise progression was also introduced and progression will be customized based on patient's performance and goals.          Exercise Goals and Review: Exercise Goals    Row Name 11/28/19 1619             Exercise Goals   Increase Physical Activity  Yes       Intervention  Provide advice, education, support and counseling about physical activity/exercise needs.;Develop an individualized exercise prescription for aerobic and resistive training based on initial evaluation findings, risk stratification, comorbidities and participant's personal goals.       Expected Outcomes  Short Term: Attend rehab on a regular basis to increase amount of physical activity.;Long Term: Add in home exercise to make exercise part of routine and to increase amount of physical activity.;Long Term: Exercising regularly at least 3-5 days a week.       Increase Strength and  Stamina  Yes       Intervention  Provide advice, education, support and counseling about physical activity/exercise needs.;Develop an individualized exercise prescription for aerobic and resistive training based on initial evaluation findings, risk stratification, comorbidities and participant's personal goals.       Expected Outcomes  Short Term: Increase workloads from initial exercise prescription for resistance, speed, and METs.;Short Term: Perform resistance training exercises routinely during rehab and add in resistance training at home;Long Term: Improve cardiorespiratory fitness, muscular endurance and strength as measured by increased METs and functional capacity (6MWT)       Able to understand and use rate of perceived exertion (RPE) scale  Yes       Intervention  Provide education and explanation on how to use RPE scale       Expected Outcomes  Short Term: Able to use RPE daily in rehab to express subjective  intensity level;Long Term:  Able to use RPE to guide intensity level when exercising independently       Able to understand and use Dyspnea scale  Yes       Intervention  Provide education and explanation on how to use Dyspnea scale       Expected Outcomes  Short Term: Able to use Dyspnea scale daily in rehab to express subjective sense of shortness of breath during exertion;Long Term: Able to use Dyspnea scale to guide intensity level when exercising independently       Knowledge and understanding of Target Heart Rate Range (THRR)  Yes       Intervention  Provide education and explanation of THRR including how the numbers were predicted and where they are located for reference       Expected Outcomes  Short Term: Able to state/look up THRR;Short Term: Able to use daily as guideline for intensity in rehab;Long Term: Able to use THRR to govern intensity when exercising independently       Able to check pulse independently  Yes       Intervention  Provide education and demonstration on how  to check pulse in carotid and radial arteries.;Review the importance of being able to check your own pulse for safety during independent exercise       Expected Outcomes  Short Term: Able to explain why pulse checking is important during independent exercise;Long Term: Able to check pulse independently and accurately       Understanding of Exercise Prescription  Yes       Intervention  Provide education, explanation, and written materials on patient's individual exercise prescription       Expected Outcomes  Short Term: Able to explain program exercise prescription;Long Term: Able to explain home exercise prescription to exercise independently       Improve claudication pain toleration; Improve walking ability  Yes       Intervention  Participate in PAD/SET Rehab 2-3 days a week, walking at home as part of exercise prescription;Attend education sessions to aid in risk factor modification and understanding of disease process       Expected Outcomes  Short Term: Improve walking distance/time to onset of claudication pain;Long Term: Improve walking ability and toleration to claudication;Long Term: Improve score of PAD questionnaires          Exercise Goals Re-Evaluation : Exercise Goals Re-Evaluation    Row Name 12/01/19 1551 12/07/19 0920 12/21/19 1420 12/22/19 1521 12/26/19 1534     Exercise Goal Re-Evaluation   Exercise Goals Review  Able to understand and use rate of perceived exertion (RPE) scale;Knowledge and understanding of Target Heart Rate Range (THRR);Able to check pulse independently;Understanding of Exercise Prescription  --  Increase Physical Activity;Increase Strength and Stamina;Understanding of Exercise Prescription  Increase Physical Activity;Increase Strength and Stamina;Understanding of Exercise Prescription;Able to understand and use rate of perceived exertion (RPE) scale;Able to understand and use Dyspnea scale;Knowledge and understanding of Target Heart Rate Range (THRR);Able to  check pulse independently  Increase Physical Activity;Increase Strength and Stamina;Able to understand and use rate of perceived exertion (RPE) scale;Knowledge and understanding of Target Heart Rate Range (THRR)   Comments  Reviewed RPE scale, THR and program prescription with pt today.  Pt voiced understanding and was given a copy of goals to take home.  Izell [place] has moved up to level 3 on NS and Biostep.  He is working at correct THR range.  Murrell [place] is doing well in rehab. He is now  up to 0.8 mph on the treadmill and most days is getting about 10 min.  We will continue to montior his progress.  Reviewed home exercise with pt today.  Pt plans to walk and use stepper at home for exercise.  Reviewed THR, pulse, RPE, sign and symptoms, NTG use, and when to call 911 or MD.  Also discussed weather considerations and indoor options.  Pt voiced understanding.  Izell [place] asked about doing more strength work.  We discussed getting 5 lb weights as he has 3 lb and doing 2-3 sets of exercises in HE packet or using videos.   Expected Outcomes  Short: Use RPE daily to regulate intensity. Long: Follow program prescription in THR.  Short - attend HT consistently Long: improve MET level  Short: Continue to improve stamina on treadmill.  Long; Continue to improve stamina  SHort: Start to add in exercise at home.  Long: Continue to improve stamina.  Short: try videos Long:  continue to improve stamina   Row Name 01/05/20 0914             Exercise Goal Re-Evaluation   Exercise Goals Review  Increase Physical Activity;Increase Strength and Stamina;Able to understand and use rate of perceived exertion (RPE) scale;Able to understand and use Dyspnea scale;Understanding of Exercise Prescription       Comments  Willie tolerates exercise well.  He works at correct RPE ranges.  Staff will monitor progress.       Expected Outcomes  Short:  exercise consistently between program sessions and home Long:  improve overall MET level           Discharge Exercise Prescription (Final Exercise Prescription Changes): Exercise Prescription Changes - 01/05/20 0900      Response to Exercise   Blood Pressure (Admit)  110/60    Blood Pressure (Exercise)  140/64    Blood Pressure (Exit)  116/58    Heart Rate (Admit)  86 bpm    Heart Rate (Exercise)  99 bpm    Heart Rate (Exit)  85 bpm    Rating of Perceived Exertion (Exercise)  15    Duration  Continue with 30 min of aerobic exercise without signs/symptoms of physical distress.    Intensity  THRR unchanged      Progression   Progression  Continue to progress workloads to maintain intensity without signs/symptoms of physical distress.    Average METs  2      Resistance Training   Weight  3 lb    Reps  10-15      Interval Training   Interval Training  No      Recumbant Bike   Level  3    RPM  60    Minutes  15    METs  1.8      Arm Ergometer   Level  1    Minutes  15    METs  2.1      Home Exercise Plan   Plans to continue exercise at  Home (comment)   walk and stepper   Frequency  Add 2 additional days to program exercise sessions.    Initial Home Exercises Provided  12/22/19       Nutrition:  Target Goals: Understanding of nutrition guidelines, daily intake of sodium <1550m, cholesterol <2060m calories 30% from fat and 7% or less from saturated fats, daily to have 5 or more servings of fruits and vegetables.  Biometrics: Pre Biometrics - 11/28/19 1620      Pre  Biometrics   Height  5' 9.5" (1.765 m)    Weight  195 lb 11.2 oz (88.8 kg)    BMI (Calculated)  28.5    Single Leg Stand  8.82 seconds        Nutrition Therapy Plan and Nutrition Goals: Nutrition Therapy & Goals - 12/28/19 1301      Nutrition Therapy   Diet  HH, Low Na    Drug/Food Interactions  Statins/Certain Fruits    Protein (specify units)  70-75g    Fiber  30 grams    Whole Grain Foods  3 servings    Saturated Fats  12 max. grams    Fruits and Vegetables  5 servings/day     Sodium  1.5 grams      Personal Nutrition Goals   Nutrition Goal  ST: Lower Na in diet LT: Increase knowledge    Comments  Pt reports liking vegetables. "too much of the wrong kind". Pt reports eating meat and trying to include more fish. Discussed HH and CKD friendly eating. Pt has been doing research and is motivated.      Intervention Plan   Intervention  Prescribe, educate and counsel regarding individualized specific dietary modifications aiming towards targeted core components such as weight, hypertension, lipid management, diabetes, heart failure and other comorbidities.;Nutrition handout(s) given to patient.    Expected Outcomes  Short Term Goal: Understand basic principles of dietary content, such as calories, fat, sodium, cholesterol and nutrients.;Short Term Goal: A plan has been developed with personal nutrition goals set during dietitian appointment.;Long Term Goal: Adherence to prescribed nutrition plan.       Nutrition Assessments:   Nutrition Goals Re-Evaluation:   Nutrition Goals Discharge (Final Nutrition Goals Re-Evaluation):   Psychosocial: Target Goals: Acknowledge presence or absence of significant depression and/or stress, maximize coping skills, provide positive support system. Participant is able to verbalize types and ability to use techniques and skills needed for reducing stress and depression.   Initial Review & Psychosocial Screening: Initial Psych Review & Screening - 11/21/19 1415      Initial Review   Current issues with  Current Sleep Concerns;Current Stress Concerns    Source of Stress Concerns  Chronic Illness;Family    Comments  having to get up to use bathroom      Family Dynamics   Good Support System?  Yes      Barriers   Psychosocial barriers to participate in program  There are no identifiable barriers or psychosocial needs.;The patient should benefit from training in stress management and relaxation.      Screening Interventions    Interventions  Encouraged to exercise;To provide support and resources with identified psychosocial needs;Provide feedback about the scores to participant    Expected Outcomes  Short Term goal: Utilizing psychosocial counselor, staff and physician to assist with identification of specific Stressors or current issues interfering with healing process. Setting desired goal for each stressor or current issue identified.;Long Term Goal: Stressors or current issues are controlled or eliminated.;Short Term goal: Identification and review with participant of any Quality of Life or Depression concerns found by scoring the questionnaire.;Long Term goal: The participant improves quality of Life and PHQ9 Scores as seen by post scores and/or verbalization of changes       Quality of Life Scores:   Scores of 19 and below usually indicate a poorer quality of life in these areas.  A difference of  2-3 points is a clinically meaningful difference.  A difference of 2-3  points in the total score of the Quality of Life Index has been associated with significant improvement in overall quality of life, self-image, physical symptoms, and general health in studies assessing change in quality of life.  PHQ-9: Recent Review Flowsheet Data    Depression screen [facility] 2/9 11/28/2019 09/13/2018 08/24/2017 08/21/2016 08/14/2015   Decreased Interest 0 0 0 0 0   Down, Depressed, Hopeless 0 0 0 0 0   PHQ - 2 Score 0 0 0 0 0   Altered sleeping 0 - - - -   Tired, decreased energy 3 - - - -   Change in appetite 0 - - - -   Feeling bad or failure about yourself  0 - - - -   Trouble concentrating 0 - - - -   Moving slowly or fidgety/restless 0 - - - -   Suicidal thoughts 0 - - - -   PHQ-9 Score 3 - - - -   Difficult doing work/chores Somewhat difficult - - - -     Interpretation of Total Score  Total Score Depression Severity:  1-4 = Minimal depression, 5-9 = Mild depression, 10-14 = Moderate depression, 15-19 = Moderately severe  depression, 20-27 = Severe depression   Psychosocial Evaluation and Intervention: Psychosocial Evaluation - 11/21/19 1422      Psychosocial Evaluation & Interventions   Comments  Izell [place] 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 He has a good support system. He helps care for his wife who had a stroke recently, so that keeps him busy. Taking time for himself may be difficult, but he wants to take care of himself for her. He does have a $20 copay but he is going to try to figure something out    Expected Outcomes  Short: attend cardiac rehab as much as possible. Long: develop positive self care habits.    Continue Psychosocial Services   Follow up required by staff       Psychosocial Re-Evaluation: Psychosocial Re-Evaluation    [place] Name 12/26/19 1530             Psychosocial Re-Evaluation   Current issues with  Current Sleep Concerns       Comments  Izell [place] states he sleeps well - he gets up to go the bathroom but doesnt have trouble going back to sleep.  He states he doesnt have any stress at this time.       Expected Outcomes  Short : maintain good sleep and stress management Long:  maintain good coping skills       Interventions  Encouraged to attend Cardiac Rehabilitation for the exercise          Psychosocial Discharge (Final Psychosocial Re-Evaluation): Psychosocial Re-Evaluation - 12/26/19 1530      Psychosocial Re-Evaluation   Current issues with  Current Sleep Concerns    Comments  Izell [place] states he sleeps well - he gets up to go the bathroom but doesnt have trouble going back to sleep.  He states he doesnt have any stress at this time.    Expected Outcomes  Short : maintain good sleep and stress management Long:  maintain good coping skills    Interventions  Encouraged to attend Cardiac Rehabilitation for the exercise       Vocational Rehabilitation: Provide vocational rehab assistance to qualifying candidates.   Vocational Rehab Evaluation & Intervention: Vocational Rehab  - 11/21/19 1422      Initial Vocational Rehab Evaluation & Intervention   Assessment shows  need for Vocational Rehabilitation  No       Education: Education Goals: Education classes will be provided on a variety of topics geared toward better understanding of heart health and risk factor modification. Participant will state understanding/return demonstration of topics presented as noted by education test scores.  Learning Barriers/Preferences: Learning Barriers/Preferences - 11/21/19 1422      Learning Barriers/Preferences   Learning Barriers  Hearing    Learning Preferences  None       Education Topics:  AED/CPR: - Group verbal and written instruction with the use of models to demonstrate the basic use of the AED with the basic ABC's of resuscitation.   General Nutrition Guidelines/Fats and Fiber: -Group instruction provided by verbal, written material, models and posters to present the general guidelines for heart healthy nutrition. Gives an explanation and review of dietary fats and fiber.   Controlling Sodium/Reading Food Labels: -Group verbal and written material supporting the discussion of sodium use in heart healthy nutrition. Review and explanation with models, verbal and written materials for utilization of the food label.   Exercise Physiology & General Exercise Guidelines: - Group verbal and written instruction with models to review the exercise physiology of the cardiovascular system and associated critical values. Provides general exercise guidelines with specific guidelines to those with heart or lung disease.    Aerobic Exercise & Resistance Training: - Gives group verbal and written instruction on the various components of exercise. Focuses on aerobic and resistive training programs and the benefits of this training and how to safely progress through these programs..   Flexibility, Balance, Mind/Body Relaxation: Provides group verbal/written instruction on  the benefits of flexibility and balance training, including mind/body exercise modes such as yoga, pilates and tai chi.  Demonstration and skill practice provided.   Stress and Anxiety: - Provides group verbal and written instruction about the health risks of elevated stress and causes of high stress.  Discuss the correlation between heart/lung disease and anxiety and treatment options. Review healthy ways to manage with stress and anxiety.   Depression: - Provides group verbal and written instruction on the correlation between heart/lung disease and depressed mood, treatment options, and the stigmas associated with seeking treatment.   Anatomy & Physiology of the Heart: - Group verbal and written instruction and models provide basic cardiac anatomy and physiology, with the coronary electrical and arterial systems. Review of Valvular disease and Heart Failure   Cardiac Procedures: - Group verbal and written instruction to review commonly prescribed medications for heart disease. Reviews the medication, class of the drug, and side effects. Includes the steps to properly store meds and maintain the prescription regimen. (beta blockers and nitrates)   Cardiac Medications I: - Group verbal and written instruction to review commonly prescribed medications for heart disease. Reviews the medication, class of the drug, and side effects. Includes the steps to properly store meds and maintain the prescription regimen.   Cardiac Medications II: -Group verbal and written instruction to review commonly prescribed medications for heart disease. Reviews the medication, class of the drug, and side effects. (all other drug classes)    Go Sex-Intimacy & Heart Disease, Get SMART - Goal Setting: - Group verbal and written instruction through game format to discuss heart disease and the return to sexual intimacy. Provides group verbal and written material to discuss and apply goal setting through the  application of the S.M.A.R.T. Method.   Other Matters of the Heart: - Provides group verbal, written materials and models to  describe Stable Angina and Peripheral Artery. Includes description of the disease process and treatment options available to the cardiac patient.   Exercise & Equipment Safety: - Individual verbal instruction and demonstration of equipment use and safety with use of the equipment.   Cardiac Rehab from 11/28/2019 in [facility] Cardiac and Pulmonary Rehab  Date  11/28/19  Educator  AS  Instruction Review Code  1- Verbalizes Understanding      Infection Prevention: - Provides verbal and written material to individual with discussion of infection control including proper hand washing and proper equipment cleaning during exercise session.   Cardiac Rehab from 11/28/2019 in [facility] Cardiac and Pulmonary Rehab  Date  11/28/19  Educator  AS  Instruction Review Code  1- Verbalizes Understanding      Falls Prevention: - Provides verbal and written material to individual with discussion of falls prevention and safety.   Cardiac Rehab from 11/28/2019 in [facility] Cardiac and Pulmonary Rehab  Date  11/28/19  Educator  AS  Instruction Review Code  1- Verbalizes Understanding      Diabetes: - Individual verbal and written instruction to review signs/symptoms of diabetes, desired ranges of glucose level fasting, after meals and with exercise. Acknowledge that pre and post exercise glucose checks will be done for 3 sessions at entry of program.   Know Your Numbers and Risk Factors: -Group verbal and written instruction about important numbers in your health.  Discussion of what are risk factors and how they play a role in the disease process.  Review of Cholesterol, Blood Pressure, Diabetes, and BMI and the role they play in your overall health.   Sleep Hygiene: -Provides group verbal and written instruction about how sleep can affect your health.  Define sleep hygiene, discuss sleep  cycles and impact of sleep habits. Review good sleep hygiene tips.    Other: -Provides group and verbal instruction on various topics (see comments)   Knowledge Questionnaire Score:   Core Components/Risk Factors/Patient Goals at Admission: Personal Goals and Risk Factors at Admission - 11/28/19 1625      Core Components/Risk Factors/Patient Goals on Admission    Weight Management  Yes    Intervention  Weight Management: Develop a combined nutrition and exercise program designed to reach desired caloric intake, while maintaining appropriate intake of nutrient and fiber, sodium and fats, and appropriate energy expenditure required for the weight goal.;Weight Management: Provide education and appropriate resources to help participant work on and attain dietary goals.    Admit Weight  195 lb 11.2 oz (88.8 kg)       Core Components/Risk Factors/Patient Goals Review:  Goals and Risk Factor Review    Row Name 12/26/19 1529             Core Components/Risk Factors/Patient Goals Review   Personal Goals Review  Weight Management/Obesity;Hypertension;Lipids;Other       Review  Izell Hiseville is taking meds as directed.  He has a BP monitor but hasnt been checking it at home.       Expected Outcomes  Short - monitor BP once a day at home Long: maintain BP at levels recommended by Dr          Core Components/Risk Factors/Patient Goals at Discharge (Final Review):  Goals and Risk Factor Review - 12/26/19 1529      Core Components/Risk Factors/Patient Goals Review   Personal Goals Review  Weight Management/Obesity;Hypertension;Lipids;Other    Review  Izell Safford is taking meds as directed.  He has a BP monitor but  hasnt been checking it at home.    Expected Outcomes  Short - monitor BP once a day at home Long: maintain BP at levels recommended by Dr       Evelena Peat Comments: ITP Comments    Row Name 11/21/19 1420 12/01/19 1551 12/14/19 0635 12/28/19 1304 01/11/20 0636   ITP Comments  Initial  orientation compelted. Diagnosis can be found in [facility] 11/22. EP orientation scheduled fro 2/8 at 3pm  First full day of exercise!  Patient was oriented to gym and equipment including functions, settings, policies, and procedures.  Patient's individual exercise prescription and treatment plan were reviewed.  All starting workloads were established based on the results of the 6 minute walk test done at initial orientation visit.  The plan for exercise progression was also introduced and progression will be customized based on patient's performance and goals.  30 day chart review completed. ITP sent to Dr Zachery Dakins Medical Director, for review,changes as needed and signature. New to program  Completed Initial RD Eval  30 day chart review completed. ITP sent to Dr Zachery Dakins Medical Director, for review,changes as needed and signature. Continue with ITP if no changes requested      Comments:

## 2020-01-11 NOTE — Progress Notes (Signed)
Daily Session Note  Patient Details  Name: MUSTAF ANTONACCI MRN: [id number] Date of Birth: 10/26/1933 Referring Provider:     Cardiac Rehab from 11/28/2019 in [facility] Cardiac and Pulmonary Rehab  Referring Provider  [name]      Encounter Date: 01/11/2020  Check In: Session Check In - 01/11/20 [place]      Check-In   Supervising physician immediately available to respond to emergencies  See telemetry face sheet for immediately available ER MD    Location  [facility]-Cardiac & Pulmonary Rehab    Staff Present  Heath Lark, RN, BSN, CCRP;Melissa Caiola RDN, LDN;Meredith Sherryll Burger, RN BSN    Virtual Visit  No    Medication changes reported      No    Fall or balance concerns reported     No    Warm-up and Cool-down  Performed on first and last piece of equipment    Resistance Training Performed  Yes    VAD Patient?  No    PAD/SET Patient?  No      Pain Assessment   Currently in Pain?  No/denies          Social History   Tobacco Use  Smoking Status Former Smoker  . Types: Cigarettes  . Quit date: 10/20/1992  . Years since quitting: 27.2  Smokeless Tobacco Never Used  Tobacco Comment   quit in 1994     Goals Met:  Independence with exercise equipment Exercise tolerated well No report of cardiac concerns or symptoms  Goals Unmet:  Not Applicable  Comments: Pt able to follow exercise prescription today without complaint.  Will continue to monitor for progression.    Dr. Emily Filbert is Medical Director for [place] and [facility].

## 2020-01-12 ENCOUNTER — Other Ambulatory Visit: Payer: Self-pay

## 2020-01-12 ENCOUNTER — Encounter: Payer: Medicare PPO | Admitting: *Deleted

## 2020-01-12 DIAGNOSIS — I214 Non-ST elevation (NSTEMI) myocardial infarction: Secondary | ICD-10-CM | POA: Diagnosis not present

## 2020-01-12 DIAGNOSIS — Z951 Presence of aortocoronary bypass graft: Secondary | ICD-10-CM

## 2020-01-12 NOTE — Progress Notes (Signed)
Daily Session Note  Patient Details  Name: Brandon Lewis MRN: [id number] Date of Birth: 02/27/1934 Referring Provider:     Cardiac Rehab from 11/28/2019 in [facility] Cardiac and Pulmonary Rehab  Referring Provider  [place]      Encounter Date: 01/12/2020  Check In: Session Check In - 01/12/20 1509      Check-In   Supervising physician immediately available to respond to emergencies  See telemetry face sheet for immediately available ER MD    Location  [facility]-Cardiac & Pulmonary Rehab    Staff Present   , RN BSN;Joseph Hood RCP,RRT,BSRT;Jessica Hawkins, MA, RCEP, CCRP, CCET    Virtual Visit  No    Medication changes reported      No    Fall or balance concerns reported     No    Warm-up and Cool-down  Performed on first and last piece of equipment    Resistance Training Performed  Yes    VAD Patient?  No    PAD/SET Patient?  No      Pain Assessment   Currently in Pain?  No/denies          Social History   Tobacco Use  Smoking Status Former Smoker  . Types: Cigarettes  . Quit date: 10/20/1992  . Years since quitting: 27.2  Smokeless Tobacco Never Used  Tobacco Comment   quit in 1994     Goals Met:  Independence with exercise equipment Exercise tolerated well No report of cardiac concerns or symptoms Strength training completed today  Goals Unmet:  Not Applicable  Comments: Pt able to follow exercise prescription today without complaint.  Will continue to monitor for progression.    Dr. Mark Miller is Medical Director for [facility] and [facility]. 

## 2020-01-16 ENCOUNTER — Encounter: Payer: Medicare PPO | Admitting: *Deleted

## 2020-01-16 ENCOUNTER — Other Ambulatory Visit: Payer: Self-pay

## 2020-01-16 DIAGNOSIS — Z951 Presence of aortocoronary bypass graft: Secondary | ICD-10-CM

## 2020-01-16 DIAGNOSIS — I214 Non-ST elevation (NSTEMI) myocardial infarction: Secondary | ICD-10-CM | POA: Diagnosis not present

## 2020-01-16 NOTE — Progress Notes (Signed)
Daily Session Note  Patient Details  Name: VERDIS KOVAL MRN: [id number] Date of Birth: 1934-04-29 Referring Provider:     Cardiac Rehab from 11/28/2019 in [facility] Cardiac and Pulmonary Rehab  Referring Provider  [name]      Encounter Date: 01/16/2020  Check In: Session Check In - 01/16/20 1516      Check-In   Supervising physician immediately available to respond to emergencies  See telemetry face sheet for immediately available ER MD    Location  [facility]-Cardiac & Pulmonary Rehab    Staff Present  Renita Papa, RN Moises Blood, BS, ACSM CEP, Exercise Physiologist;Joseph Darrin Nipper, [place], RCEP, CCRP, CCET    Virtual Visit  No    Medication changes reported      No    Fall or balance concerns reported     No    Warm-up and Cool-down  Performed on first and last piece of equipment    Resistance Training Performed  Yes    VAD Patient?  No    PAD/SET Patient?  No      Pain Assessment   Currently in Pain?  No/denies          Social History   Tobacco Use  Smoking Status Former Smoker  . Types: Cigarettes  . Quit date: 10/20/1992  . Years since quitting: 27.2  Smokeless Tobacco Never Used  Tobacco Comment   quit in 1994     Goals Met:  Independence with exercise equipment Exercise tolerated well No report of cardiac concerns or symptoms Strength training completed today  Goals Unmet:  Not Applicable  Comments: Pt able to follow exercise prescription today without complaint.  Will continue to monitor for progression.    Dr. Emily Filbert is Medical Director for [place] and [facility].

## 2020-01-17 ENCOUNTER — Other Ambulatory Visit: Payer: Self-pay

## 2020-01-17 ENCOUNTER — Encounter: Payer: Medicare PPO | Attending: Physical Medicine & Rehabilitation | Admitting: Physical Medicine & Rehabilitation

## 2020-01-17 ENCOUNTER — Encounter: Payer: Self-pay | Admitting: Physical Medicine & Rehabilitation

## 2020-01-17 VITALS — BP 124/73 | HR 75 | Temp 97.5°F | Ht 70.0 in | Wt 194.0 lb

## 2020-01-17 DIAGNOSIS — M47816 Spondylosis without myelopathy or radiculopathy, lumbar region: Secondary | ICD-10-CM | POA: Diagnosis not present

## 2020-01-17 NOTE — Progress Notes (Signed)
Subjective:    Patient ID: Brandon Lewis, male    DOB: [birth year], 86 y.o.   MRN: [id number] 08/18/2019 RightL5 dorsal ramus., Right L4 and Right L3 medial branch radio frequency neurotomy under fluoroscopic guidance  HPI Cardiac issues-CABG x 4 with 1 stent at [facility] 09/16/2019 had post op ileus with 3 wk hospitalization   Doing well with low back pain No problems with cardiac rehab 1hr 3 times a week  Patient is thinking about getting back to golf so he can play with his sons  Independent with self care and mobility   Helps to care for his wife who has had a stroke with left spastic hemiparesis  Pain Inventory Average Pain 0 Pain Right Now 0 My pain is no pain  In the last 24 hours, has pain interfered with the following? General activity 0 Relation with others 0 Enjoyment of life 0 What TIME of day is your pain at its worst? no pain Sleep (in general) Good  Pain is worse with: no pain Pain improves with: no pains Relief from Meds: no pains  Mobility walk without assistance ability to climb steps?  yes do you drive?  yes  Function retired  Neuro/Psych weakness  Prior Studies Any changes since last visit?  no  Physicians involved in your care Any changes since last visit?  no   Family History  Problem Relation Age of Onset  . Diabetes Father        DM - and brother   . Stroke Father   . Coronary artery disease Paternal Grandfather   . Colon cancer Neg Hx   . Prostate cancer Neg Hx    Social History   Socioeconomic History  . Marital status: Married    Spouse name: Not on file  . Number of children: 3  . Years of education: Not on file  . Highest education level: Not on file  Occupational History  . Occupation: Landscape architect    Comment: Retired  Tobacco Use  . Smoking status: Former Smoker    Types: Cigarettes    Quit date: 10/20/1992    Years since quitting: 27.2  . Smokeless tobacco: Never Used  . Tobacco comment: quit in  1994   Substance and Sexual Activity  . Alcohol use: Not Currently    Comment: rare  . Drug use: No  . Sexual activity: Not on file  Other Topics Concern  . Not on file  Social History Narrative   Widowed; then remarried; 3 sons.      Has a living will - wife has health care POA    Son [name] would be alternate   Would want resuscitation attempts   Would accept brief trial of artificial nutrition      Pt signed designated party release form and gives Reyden Balderston [id number] (home #), access to medical records. Can also leave msg on home answering machine. Cell # [phone]   Social Determinants of Health   Financial Resource Strain:   . Difficulty of Paying Living Expenses:   Food Insecurity:   . Worried About Charity fundraiser in the Last Year:   . Arboriculturist in the Last Year:   Transportation Needs:   . Film/video editor (Medical):   Marland Kitchen Lack of Transportation (Non-Medical):   Physical Activity:   . Days of Exercise per Week:   . Minutes of Exercise per Session:   Stress:   . Feeling of Stress :  Social Connections:   . Frequency of Communication with Friends and Family:   . Frequency of Social Gatherings with Friends and Family:   . Attends Religious Services:   . Active Member of Clubs or Organizations:   . Attends Archivist Meetings:   Marland Kitchen Marital Status:    Past Surgical History:  Procedure Laterality Date  . aorto-bifem  2003   [facility]   . CAROTID STENT     [facility]  . CATARACT EXTRACTION, BILATERAL  2017  . CHOLECYSTECTOMY  2002  . CORONARY ARTERY BYPASS GRAFT N/A 09/16/2019   Procedure: CORONARY ARTERY BYPASS GRAFTING (CABG) using LIMA to LAD; Endoscopic right saphenous vein harvest to OM1, Diag1, and RCA.;  Surgeon: Wonda Olds, MD;  Location: [facility];  Service: Open Heart Surgery;  Laterality: N/A;  . LEFT HEART CATH AND CORONARY ANGIOGRAPHY N/A 09/12/2019   Procedure: LEFT HEART CATH AND CORONARY ANGIOGRAPHY;  Surgeon: Corey Skains, MD;  Location: [facility];  Service: Cardiovascular;  Laterality: N/A;  . TEE WITHOUT CARDIOVERSION N/A 09/16/2019   Procedure: TRANSESOPHAGEAL ECHOCARDIOGRAM (TEE);  Surgeon: Wonda Olds, MD;  Location: [facility];  Service: Open Heart Surgery;  Laterality: N/A;  . TONSILLECTOMY    . WISDOM TOOTH EXTRACTION     Past Medical History:  Diagnosis Date  . CAD (coronary artery disease)    s/p PCI after presenting with exertional angina (drug eluting stent)  . Diverticulosis   . GERD (gastroesophageal reflux disease)   . HTN (hypertension)   . Hyperlipidemia   . Osteoarthritis   . PVD (peripheral vascular disease) ([place])    s/p aortobifemoral bypass, with bilateral SFA occlusion and intermittent claudication   . Small bowel obstruction ([facility])    BP 124/73   Pulse 75   Temp (!) 97.5 F (36.4 C)   Ht 5\' 10"  (1.778 m)   Wt 194 lb (88 kg)   SpO2 97%   BMI 27.84 kg/m   Opioid Risk Score:   Fall Risk Score:  `1  Depression screen PHQ 2/9  Depression screen [facility] 2/9 11/28/2019 09/13/2018 08/24/2017 08/21/2016 08/14/2015 07/25/2014 07/25/2014  Decreased Interest 0 0 0 0 0 0 0  Down, Depressed, Hopeless 0 0 0 0 0 0 0  PHQ - 2 Score 0 0 0 0 0 0 0  Altered sleeping 0 - - - - - -  Tired, decreased energy 3 - - - - - -  Change in appetite 0 - - - - - -  Feeling bad or failure about yourself  0 - - - - - -  Trouble concentrating 0 - - - - - -  Moving slowly or fidgety/restless 0 - - - - - -  Suicidal thoughts 0 - - - - - -  PHQ-9 Score 3 - - - - - -  Difficult doing work/chores Somewhat difficult - - - - - -    Review of Systems  Constitutional: Negative.   HENT: Negative.   Eyes: Negative.   Respiratory: Negative.   Cardiovascular: Negative.   Gastrointestinal: Negative.   Endocrine: Negative.   Genitourinary: Negative.   Musculoskeletal: Negative.   Skin: Negative.   Neurological: Positive for weakness.  Hematological: Negative.   Psychiatric/Behavioral: Negative.     All other systems reviewed and are negative.      Objective:   Physical Exam Vitals and nursing note reviewed.  Constitutional:      Appearance: Normal appearance. He is obese.  Eyes:  Extraocular Movements: Extraocular movements intact.     Conjunctiva/sclera: Conjunctivae normal.     Pupils: Pupils are equal, round, and reactive to light.  Musculoskeletal:     Comments: No pain with extension of lumbar spine   Neurological:     General: No focal deficit present.     Mental Status: He is alert and oriented to person, place, and time.  Psychiatric:        Mood and Affect: Mood normal.        Behavior: Behavior normal.   Negative straight leg raising Lower extremity strength is 5/5 hip flexor knee extensor ankle dorsiflexor Gait without evidence of toe drag or knee instability he does not require assistive device.  He does have a forward flexed posture.       Assessment & Plan:  #1.  Lumbar spondylosis without myelopathy mainly symptomatic on the right side.  He has no left-sided symptoms.  He is feeling good enough to contemplate going back to play golf.  He is performing cardiac rehab and this may be helping with his overall mobility. I have encouraged him to continue cardiac rehab even after it finishes in June.  We discussed that his radiofrequency neurotomy duration of effect is difficult to predict.  Usual duration is 6 to 12 months.  He will call me if he feels like his right-sided low back pain is starting to gradually increase.  We can repeat procedure anytime after February 16, 2020

## 2020-01-17 NOTE — Patient Instructions (Addendum)
Please continue your cardiac rehab even after classes finished  Please call if low back pain worsens

## 2020-01-18 ENCOUNTER — Encounter: Payer: Medicare PPO | Admitting: *Deleted

## 2020-01-18 DIAGNOSIS — Z951 Presence of aortocoronary bypass graft: Secondary | ICD-10-CM

## 2020-01-18 DIAGNOSIS — I214 Non-ST elevation (NSTEMI) myocardial infarction: Secondary | ICD-10-CM | POA: Diagnosis not present

## 2020-01-18 NOTE — Progress Notes (Signed)
Daily Session Note  Patient Details  Name: RAMZY CAPPELLETTI MRN: [id number] Date of Birth: May 27, 1934 Referring Provider:     Cardiac Rehab from 11/28/2019 in [facility] Cardiac and Pulmonary Rehab  Referring Provider  [name]      Encounter Date: 01/18/2020  Check In: Session Check In - 01/18/20 1538      Check-In   Supervising physician immediately available to respond to emergencies  See telemetry face sheet for immediately available ER MD    Staff Present  Nyoka Cowden, RN, BSN, Ardeth Sportsman RDN, LDN;Meredith Sherryll Burger, RN BSN    Virtual Visit  No    Medication changes reported      No    Fall or balance concerns reported     No    Warm-up and Cool-down  Performed on first and last piece of equipment    Resistance Training Performed  Yes    VAD Patient?  No    PAD/SET Patient?  No      Pain Assessment   Currently in Pain?  No/denies          Social History   Tobacco Use  Smoking Status Former Smoker  . Types: Cigarettes  . Quit date: 10/20/1992  . Years since quitting: 27.2  Smokeless Tobacco Never Used  Tobacco Comment   quit in 1994     Goals Met:  Independence with exercise equipment Exercise tolerated well No report of cardiac concerns or symptoms  Goals Unmet:  Not Applicable  Comments:Pt able to follow exercise prescription today without complaint.  Will continue to monitor for progression.   Dr. Emily Filbert is Medical Director for [place] and [facility].

## 2020-01-23 ENCOUNTER — Encounter: Payer: Medicare PPO | Attending: Internal Medicine | Admitting: *Deleted

## 2020-01-23 ENCOUNTER — Other Ambulatory Visit: Payer: Self-pay

## 2020-01-23 DIAGNOSIS — I214 Non-ST elevation (NSTEMI) myocardial infarction: Secondary | ICD-10-CM | POA: Diagnosis not present

## 2020-01-23 DIAGNOSIS — Z951 Presence of aortocoronary bypass graft: Secondary | ICD-10-CM | POA: Diagnosis not present

## 2020-01-23 NOTE — Progress Notes (Signed)
Daily Session Note  Patient Details  Name: Brandon Lewis MRN: [id number] Date of Birth: Feb 14, 1934 Referring Provider:     Cardiac Rehab from 11/28/2019 in [facility] Cardiac and Pulmonary Rehab  Referring Provider  [name]      Encounter Date: 01/23/2020  Check In: Session Check In - 01/23/20 1510      Check-In   Supervising physician immediately available to respond to emergencies  See telemetry face sheet for immediately available ER MD    Location  [facility]-Cardiac & Pulmonary Rehab    Staff Present  Renita Papa, RN Moises Blood, BS, ACSM CEP, Exercise Physiologist;Joseph Tessie Fass RCP,RRT,BSRT    Virtual Visit  No    Medication changes reported      No    Fall or balance concerns reported     No    Warm-up and Cool-down  Performed on first and last piece of equipment    Resistance Training Performed  Yes    VAD Patient?  No    PAD/SET Patient?  No      Pain Assessment   Currently in Pain?  No/denies          Social History   Tobacco Use  Smoking Status Former Smoker  . Types: Cigarettes  . Quit date: 10/20/1992  . Years since quitting: 27.2  Smokeless Tobacco Never Used  Tobacco Comment   quit in 1994     Goals Met:  Independence with exercise equipment Exercise tolerated well No report of cardiac concerns or symptoms Strength training completed today  Goals Unmet:  Not Applicable  Comments: Pt able to follow exercise prescription today without complaint.  Will continue to monitor for progression.    Dr. Emily Filbert is Medical Director for [facility] and [facility].

## 2020-01-25 ENCOUNTER — Other Ambulatory Visit: Payer: Self-pay

## 2020-01-25 ENCOUNTER — Encounter: Payer: Medicare PPO | Admitting: *Deleted

## 2020-01-25 DIAGNOSIS — Z951 Presence of aortocoronary bypass graft: Secondary | ICD-10-CM | POA: Diagnosis not present

## 2020-01-25 DIAGNOSIS — I214 Non-ST elevation (NSTEMI) myocardial infarction: Secondary | ICD-10-CM | POA: Diagnosis not present

## 2020-01-25 NOTE — Progress Notes (Signed)
Daily Session Note  Patient Details  Name: WORLEY RADERMACHER MRN: [id number] Date of Birth: 1934-06-06 Referring Provider:     Cardiac Rehab from 11/28/2019 in [facility] Card[facility] and [facility] Rehab  Referring Provider  [name]      Encounter Date: 01/25/2020  Check In: Session Check In - 01/25/20 1502      Check-In   Supervising physician immediately available to respond to emergencies  See telemetry face sheet for immediately available ER MD    Location  [facility]-Cardiac & Pulmonary Rehab    Staff Present  Renita Papa, RN BSN;Melissa Caiola RDN, Rowe Pavy, BA, ACSM CEP, Exercise Physiologist    Virtual Visit  No    Medication changes reported      No    Fall or balance concerns reported     No    Warm-up and Cool-down  Performed on first and last piece of equipment    Resistance Training Performed  Yes    VAD Patient?  No    PAD/SET Patient?  No      Pain Assessment   Currently in Pain?  No/denies          Social History   Tobacco Use  Smoking Status Former Smoker  . Types: Cigarettes  . Quit date: 10/20/1992  . Years since quitting: 27.2  Smokeless Tobacco Never Used  Tobacco Comment   quit in 1994     Goals Met:  Independence with exercise equipment Exercise tolerated well No report of cardiac concerns or symptoms Strength training completed today  Goals Unmet:  Not Applicable  Comments: Pt able to follow exercise prescription today without complaint.  Will continue to monitor for progression.     Dr. Emily Filbert is Medical Director for [place] and [facility].

## 2020-01-26 ENCOUNTER — Other Ambulatory Visit: Payer: Self-pay

## 2020-01-26 ENCOUNTER — Encounter: Payer: Medicare PPO | Admitting: *Deleted

## 2020-01-26 DIAGNOSIS — I214 Non-ST elevation (NSTEMI) myocardial infarction: Secondary | ICD-10-CM | POA: Diagnosis not present

## 2020-01-26 DIAGNOSIS — Z951 Presence of aortocoronary bypass graft: Secondary | ICD-10-CM

## 2020-01-26 NOTE — Progress Notes (Signed)
Daily Session Note  Patient Details  Name: Brandon Lewis MRN: [id number] Date of Birth: Sep 16, 1934 Referring Provider:     Cardiac Rehab from 11/28/2019 in [facility] Cardiac and Pulmonary Rehab  Referring Provider  [name]      Encounter Date: 01/26/2020  Check In: Session Check In - 01/26/20 1518      Check-In   Supervising physician immediately available to respond to emergencies  See telemetry face sheet for immediately available ER MD    Location  [facility]Cardiac [facility] Pul[facility] Re[facility]    Staff Present  Nyoka Cowden, RN, BSN, MA;Meredith Sherryll Burger, RN BSN;Joseph Flavia Shipper    Virtual Visit  No    Fall or balance concerns reported     No    Warm-up and Cool-down  Performed on first and last piece of equipment    VAD Patient?  No    PAD/SET Patient?  No      Pain Assessment   Currently in Pain?  No/denies          Social History   Tobacco Use  Smoking Status Former Smoker  . Types: Cigarettes  . Quit date: 10/20/1992  . Years since quitting: 27.2  Smokeless Tobacco Never Used  Tobacco Comment   quit in 1994     Goals Met:  Independence with exercise equipment Exercise tolerated well No report of cardiac concerns or symptoms  Goals Unmet:  Not Applicable  Pt able to follow exercise prescription today without complaint.  Will continue to monitor for progression.   Dr. Emily Filbert is Medical Director for [place] and [facility].

## 2020-01-30 ENCOUNTER — Encounter: Payer: Medicare PPO | Admitting: *Deleted

## 2020-01-30 ENCOUNTER — Other Ambulatory Visit: Payer: Self-pay

## 2020-01-30 DIAGNOSIS — I214 Non-ST elevation (NSTEMI) myocardial infarction: Secondary | ICD-10-CM | POA: Diagnosis not present

## 2020-01-30 DIAGNOSIS — Z951 Presence of aortocoronary bypass graft: Secondary | ICD-10-CM

## 2020-01-30 NOTE — Progress Notes (Signed)
Daily Session Note  Patient Details  Name: Brandon Lewis MRN: [id number] Date of Birth: [birth year] Referring Provider:     Cardiac Rehab from 11/28/2019 in [facility] Cardiac and Pulmonary Rehab  Referring Provider  [name]      Encounter Date: 01/30/2020  Check In: Session Check In - 01/30/20 1504      Check-In   Supervising physician immediately available to respond to emergencies  See telemetry face sheet for immediately available ER MD    Location  [facility]Cardiac & Pulmonary Rehab    Staff Present  Renita Papa, RN BSN;Joseph [address] [place], [place], ACSM CEP, Exercise Physiologist;Jessica [place], [place], RCEP, CCRP, CCET    Virtual Visit  No    Medication changes reported      No    Fall or balance concerns reported     No    Warm-up and Cool-down  Performed on first and last piece of equipment    Resistance Training Performed  Yes    VAD Patient?  No    PAD/SET Patient?  No      Pain Assessment   Currently in Pain?  No/denies          Social History   Tobacco Use  Smoking Status Former Smoker  . Types: Cigarettes  . Quit date: 10/20/1992  . Years since quitting: 27.2  Smokeless Tobacco Never Used  Tobacco Comment   quit in 1994     Goals Met:  Independence with exercise equipment Exercise tolerated well No report of cardiac concerns or symptoms Strength training completed today  Goals Unmet:  Not Applicable  Comments: Pt able to follow exercise prescription today without complaint.  Will continue to monitor for progression.    Dr. Emily Filbert is Medical Director for [place] and [facility].

## 2020-02-01 ENCOUNTER — Other Ambulatory Visit: Payer: Self-pay

## 2020-02-01 ENCOUNTER — Encounter: Payer: Medicare PPO | Admitting: *Deleted

## 2020-02-01 DIAGNOSIS — Z951 Presence of aortocoronary bypass graft: Secondary | ICD-10-CM | POA: Diagnosis not present

## 2020-02-01 DIAGNOSIS — I214 Non-ST elevation (NSTEMI) myocardial infarction: Secondary | ICD-10-CM

## 2020-02-01 NOTE — Progress Notes (Signed)
Daily Session Note  Patient Details  Name: Brandon Lewis MRN: [id number] Date of Birth: 10-31-1933 Referring Provider:     Cardiac Rehab from 11/28/2019 in [facility] Cardiac and Pulmonary Rehab  Referring Provider  [name]      Encounter Date: 02/01/2020  Check In: Session Check In - 02/01/20 1509      Check-In   Supervising physician immediately available to respond to emergencies  See telemetry face sheet for immediately available ER MD    Location  [facility] Pul[facility]    Staff Present  Renita Papa, RN BSN;Melissa Caiola RDN, LDN;Jessica [place], MA, RCEP, CCRP, CCET    Virtual Visit  No    Medication changes reported      No    Warm-up and Cool-down  Performed on first and last piece of equipment    Resistance Training Performed  Yes    VAD Patient?  No    PAD/SET Patient?  No      Pain Assessment   Currently in Pain?  No/denies          Social History   Tobacco Use  Smoking Status Former Smoker  . Types: Cigarettes  . Quit date: 10/20/1992  . Years since quitting: 27.3  Smokeless Tobacco Never Used  Tobacco Comment   quit in 1994     Goals Met:  Independence with exercise equipment Exercise tolerated well No report of cardiac concerns or symptoms Strength training completed today  Goals Unmet:  Not Applicable  Comments: Pt able to follow exercise prescription today without complaint.  Will continue to monitor for progression.    Dr. Emily Filbert is Medical Director for [place] [facility].

## 2020-02-02 ENCOUNTER — Other Ambulatory Visit: Payer: Self-pay

## 2020-02-02 ENCOUNTER — Encounter: Payer: Medicare PPO | Admitting: *Deleted

## 2020-02-02 DIAGNOSIS — Z951 Presence of aortocoronary bypass graft: Secondary | ICD-10-CM

## 2020-02-02 DIAGNOSIS — I214 Non-ST elevation (NSTEMI) myocardial infarction: Secondary | ICD-10-CM

## 2020-02-02 NOTE — Progress Notes (Signed)
Daily Session Note  Patient Details  Name: Brandon Lewis MRN: [id number] Date of Birth: 15-Jan-1934 Referring Provider:     Cardiac Rehab from 11/28/2019 in [facility] Cardiac and Pulmonary Rehab  Referring Provider  [name]      Encounter Date: 02/02/2020  Check In: Session Check In - 02/02/20 1519      Check-In   Supervising physician immediately available to respond to emergencies  See telemetry face sheet for immediately available ER MD    Location  [facility]    Staff Present  Renita Papa, RN BSN;Joseph Foy [place], [place], ACSM CEP, Exercise Physiologist    Virtual Visit  No    Medication changes reported      No    Fall or balance concerns reported     No    Warm-up and Cool-down  Performed on first and last piece of equipment    Resistance Training Performed  Yes    VAD Patient?  No    PAD/SET Patient?  No      Pain Assessment   Currently in Pain?  No/denies          Social History   Tobacco Use  Smoking Status Former Smoker  . Types: Cigarettes  . Quit date: 10/20/1992  . Years since quitting: 27.3  Smokeless Tobacco Never Used  Tobacco Comment   quit in 1994     Goals Met:  Independence with exercise equipment Exercise tolerated well No report of cardiac concerns or symptoms Strength training completed today  Goals Unmet:  Not Applicable  Comments: Pt able to follow exercise prescription today without complaint.  Will continue to monitor for progression.    Dr. Emily Filbert is Medical Director for [place] and [facility].

## 2020-02-06 ENCOUNTER — Other Ambulatory Visit: Payer: Self-pay

## 2020-02-06 ENCOUNTER — Encounter: Payer: Medicare PPO | Admitting: *Deleted

## 2020-02-06 DIAGNOSIS — Z951 Presence of aortocoronary bypass graft: Secondary | ICD-10-CM

## 2020-02-06 DIAGNOSIS — I214 Non-ST elevation (NSTEMI) myocardial infarction: Secondary | ICD-10-CM | POA: Diagnosis not present

## 2020-02-06 NOTE — Progress Notes (Signed)
Daily Session Note  Patient Details  Name: Brandon Lewis MRN: [id number] Date of Birth: Sep 29, 1934 Referring Provider:     Cardiac Rehab from 11/28/2019 in [facility] Cardiac and Pulmonary Rehab  Referring Provider  [name]      Encounter Date: 02/06/2020  Check In: Session Check In - 02/06/20 1504      Check-In   Supervising physician immediately available to respond to emergencies  See telemetry face sheet for immediately available ER MD    Location  [facility] Pul[facility] Re[facility]    Staff Present  Renita Papa, RN BSN;Joseph [address] [place], [place], ACSM CEP, Exercise Physiologist    Virtual Visit  No    Medication changes reported      Yes    Comments  dc'd amio    Fall or balance concerns reported     No    Warm-up and Cool-down  Performed on first and last piece of equipment    Resistance Training Performed  Yes    VAD Patient?  No    PAD/SET Patient?  No      Pain Assessment   Currently in Pain?  No/denies          Social History   Tobacco Use  Smoking Status Former Smoker  . Types: Cigarettes  . Quit date: 10/20/1992  . Years since quitting: 27.3  Smokeless Tobacco Never Used  Tobacco Comment   quit in 1994     Goals Met:  Independence with exercise equipment Exercise tolerated well No report of cardiac concerns or symptoms Strength training completed today  Goals Unmet:  Not Applicable  Comments: Pt able to follow exercise prescription today without complaint.  Will continue to monitor for progression.    Dr. Emily Filbert is Medical Director for [place] and [facility].

## 2020-02-08 ENCOUNTER — Encounter: Payer: Self-pay | Admitting: *Deleted

## 2020-02-08 DIAGNOSIS — Z951 Presence of aortocoronary bypass graft: Secondary | ICD-10-CM

## 2020-02-08 DIAGNOSIS — I214 Non-ST elevation (NSTEMI) myocardial infarction: Secondary | ICD-10-CM

## 2020-02-08 NOTE — Progress Notes (Signed)
Cardiac Individual Treatment Plan  Patient Details  Name: Brandon Lewis MRN: [id number] Date of Birth: 1934/04/26 Referring Provider:     Cardiac Rehab from 11/28/2019 in [facility] Cardiac and Pulmonary Rehab  Referring Provider  [name]      Initial Encounter Date:    Cardiac Rehab from 11/28/2019 in [facility] Cardiac and Pulmonary Rehab  Date  11/28/19      Visit Diagnosis: S/P CABG x 3  NSTEMI (non-ST elevated myocardial infarction) ([place])  Patient's Home Medications on Admission:  Current Outpatient Medications:  .  amiodarone (PACERONE) 200 MG tablet, Take 1 tablet (200 mg total) by mouth 2 (two) times daily. Take one tablet by mouth TWICE daily for 7 days  then take one tablet by mouth ONCE daily., Disp: 60 tablet, Rfl: 1 .  aspirin 81 MG chewable tablet, Chew 1 tablet (81 mg total) by mouth daily., Disp:  , Rfl:  .  cholecalciferol (VITAMIN D) 1000 UNITS tablet, Take 1,000 Units by mouth at bedtime. , Disp: , Rfl:  .  clopidogrel (PLAVIX) 75 MG tablet, TAKE ONE TABLET BY MOUTH EVERY DAY *BOTTLE*, Disp: 90 tablet, Rfl: 3 .  Magnesium 400 MG CAPS, Take by mouth daily., Disp: , Rfl:  .  metoprolol succinate (TOPROL-XL) 50 MG 24 hr tablet, **VIAL ONLY** TAKE (1) TABLET BY MOUTH ONCE A DAY.DO NOT CRUSH. (HIGHBLOOD PRESSURE) *BOTTLE*, Disp: 30 tablet, Rfl: 11 .  Multiple Vitamin (MULTIVITAMIN WITH MINERALS) TABS tablet, Take 1 tablet by mouth every morning., Disp: , Rfl:  .  rosuvastatin (CRESTOR) 10 MG tablet, **VIAL ONLY** TAKE ONE TABLET BY MOUTH AT BEDTIME. (IMPROVES CHOLESTEROL), Disp: 90 tablet, Rfl: 3 .  sodium chloride (BRONCHO SALINE) inhaler solution, Take 1 spray by nebulization daily as needed (congestion)., Disp: , Rfl:  .  tamsulosin (FLOMAX) 0.4 MG CAPS capsule, TAKE 1 CAPSULE BY MOUTH ONCE DAILY, Disp: 30 capsule, Rfl: 11  Current Facility-Administered Medications:  .  mometasone-formoterol (DULERA) 100-5 MCG/ACT inhaler 2 puff, 2 puff, Inhalation, BID, Atkins, [place]ice [place],  MD  Past Medical History: Past Medical History:  Diagnosis Date  . CAD (coronary artery disease)    s/p PCI after presenting with exertional angina (drug eluting stent)  . Diverticulosis   . GERD (gastroesophageal reflux disease)   . HTN (hypertension)   . Hyperlipidemia   . Osteoarthritis   . PVD (peripheral vascular disease) ([place])    s/p aortobifemoral bypass, with bilateral SFA occlusion and intermittent claudication   . Small bowel obstruction (HCC)     Tobacco Use: Social History   Tobacco Use  Smoking Status Former Smoker  . Types: Cigarettes  . Quit date: 10/20/1992  . Years since quitting: 27.3  Smokeless Tobacco Never Used  Tobacco Comment   quit in 1994     Labs: Recent Review Flowsheet Data    Labs for ITP Cardiac and Pulmonary Rehab Latest Ref Rng & Units 09/16/2019 09/16/2019 09/16/2019 09/16/2019 09/16/2019   Cholestrol 0 - 200 mg/dL - - - - -   LDLCALC 0 - 99 mg/dL - - - - -   LDLDIRECT mg/dL - - - - -   HDL >40 mg/dL - - - - -   Trlycerides <150 mg/dL - - - - -   Hemoglobin A1c 4.8 - 5.6 % - - - - -   PHART 7.350 - 7.450 - - 7.313(L) 7.330(L) 7.313(L)   PCO2ART 32.0 - 48.0 mmHg - - 42.7 29.1(L) 31.9(L)   HCO3 20.0 - 28.0 mmol/L - -  21.7 15.4(L) 16.2(L)   TCO2 22 - 32 mmol/L 25 23 23  16(L) 17(L)   ACIDBASEDEF 0.0 - 2.0 mmol/L - - 4.0(H) 10.0(H) 9.0(H)   O2SAT % - - 99.0 91.0 96.0       Exercise Target Goals: Exercise Program Goal: Individual exercise prescription set using results from initial 6 min walk test and THRR while considering  patient's activity barriers and safety.   Exercise Prescription Goal: Initial exercise prescription builds to 30-45 minutes a day of aerobic activity, 2-3 days per week.  Home exercise guidelines will be given to patient during program as part of exercise prescription that the participant will acknowledge.   Education: Aerobic Exercise & Resistance Training: - Gives group verbal and written instruction on the  various components of exercise. Focuses on aerobic and resistive training programs and the benefits of this training and how to safely progress through these programs..   Education: Exercise & Equipment Safety: - Individual verbal instruction and demonstration of equipment use and safety with use of the equipment.   Cardiac Rehab from 11/28/2019 in [facility] Cardiac and Pulmonary Rehab  Date  11/28/19  Educator  AS  Instruction Review Code  1- Verbalizes Understanding      Education: Exercise Physiology & General Exercise Guidelines: - Group verbal and written instruction with models to review the exercise physiology of the cardiovascular system and associated critical values. Provides general exercise guidelines with specific guidelines to those with heart or lung disease.    Education: Flexibility, Balance, Mind/Body Relaxation: Provides group verbal/written instruction on the benefits of flexibility and balance training, including mind/body exercise modes such as yoga, pilates and tai chi.  Demonstration and skill practice provided.   Activity Barriers & Risk Stratification: Activity Barriers & Cardiac Risk Stratification - 11/21/19 1415      Activity Barriers & Cardiac Risk Stratification   Activity Barriers  Other (comment);Back Problems    Comments  claudication pain    Cardiac Risk Stratification  High       6 Minute Walk: 6 Minute Walk    Row Name 11/28/19 1609         6 Minute Walk   Phase  Discharge     Distance  610 feet     Walk Time  6 minutes     # of Rest Breaks  0     MPH  1.15     METS  1     RPE  15     Perceived Dyspnea   3     VO2 Peak  3.53     Symptoms  Yes (comment)     Comments  claudication leg pain 6/10 ( normal for him walking)     Resting HR  79 bpm     Resting BP  122/56     Resting Oxygen Saturation   97 %     Exercise Oxygen Saturation  during 6 min walk  99 %     Max Ex. HR  102 bpm     Max Ex. BP  134/54     2 Minute Post BP  130/56         Oxygen Initial Assessment:   Oxygen Re-Evaluation:   Oxygen Discharge (Final Oxygen Re-Evaluation):   Initial Exercise Prescription: Initial Exercise Prescription - 11/28/19 1600      Date of Initial Exercise RX and Referring Provider   Date  11/28/19    Referring Provider  [name]      Treadmill  MPH  1    Grade  0    Minutes  15    METs  1.77      NuStep   Level  1    SPM  80    Minutes  15    METs  1      Recumbant Elliptical   Level  1    RPM  50    Minutes  15    METs  1      REL-XR   Level  1    Speed  50    Minutes  15    METs  1      Prescription Details   Frequency (times per week)  3    Duration  Progress to 30 minutes of continuous aerobic without signs/symptoms of physical distress      Intensity   THRR 40-80% of Max Heartrate  102-125    Ratings of Perceived Exertion  11-13    Perceived Dyspnea  0-4       Perform Capillary Blood Glucose checks as needed.  Exercise Prescription Changes: Exercise Prescription Changes    Row Name 11/28/19 1600 12/07/19 0900 12/21/19 1400 12/22/19 1500 01/05/20 0900     Response to Exercise   Blood Pressure (Admit)  122/56  120/60  128/74  --  110/60   Blood Pressure (Exercise)  134/54  116/62  136/70  --  140/64   Blood Pressure (Exit)  130/56  116/58  132/70  --  116/58   Heart Rate (Admit)  79 bpm  79 bpm  88 bpm  --  86 bpm   Heart Rate (Exercise)  102 bpm  115 bpm  91 bpm  --  99 bpm   Heart Rate (Exit)  86 bpm  89 bpm  83 bpm  --  85 bpm   Oxygen Saturation (Admit)  97 %  --  --  --  --   Oxygen Saturation (Exercise)  99 %  --  --  --  --   Rating of Perceived Exertion (Exercise)  15  15  14   --  15   Perceived Dyspnea (Exercise)  3  --  --  --  --   Symptoms  claudication leg pain  --  fatigue  --  --   Comments  --  second day  --  --  --   Duration  --  Progress to 30 minutes of  aerobic without signs/symptoms of physical distress  Progress to 30 minutes of  aerobic without signs/symptoms  of physical distress  --  Continue with 30 min of aerobic exercise without signs/symptoms of physical distress.   Intensity  --  THRR unchanged  THRR unchanged  --  THRR unchanged     Progression   Progression  --  Continue to progress workloads to maintain intensity without signs/symptoms of physical distress.  Continue to progress workloads to maintain intensity without signs/symptoms of physical distress.  --  Continue to progress workloads to maintain intensity without signs/symptoms of physical distress.   Average METs  --  2  1.8  --  2     Resistance Training   Weight  --  3 lb  3 lb  --  3 lb   Reps  --  10-15  10-15  --  10-15     Interval Training   Interval Training  --  No  No  --  No     Treadmill   MPH  --  --  0.8  --  --   Grade  --  --  15  --  --   Minutes  --  --  10  --  --   METs  --  --  1.6  --  --     Recumbant Bike   Level  --  --  --  --  3   RPM  --  --  --  --  60   Minutes  --  --  --  --  15   METs  --  --  --  --  1.8     NuStep   Level  --  3  --  --  --   SPM  --  80  --  --  --   Minutes  --  15  --  --  --   METs  --  2.3  --  --  --     Arm Ergometer   Level  --  --  --  --  1   Minutes  --  --  --  --  15   METs  --  --  --  --  2.1     T5 Nustep   Level  --  --  3  --  --   Minutes  --  --  15  --  --   METs  --  --  1.8  --  --     Biostep-RELP   Level  --  3  --  --  --   SPM  --  50  --  --  --   Minutes  --  15  --  --  --   METs  --  2  --  --  --     Home Exercise Plan   Plans to continue exercise at  --  --  --  Home (comment) walk and stepper  Home (comment) walk and stepper   Frequency  --  --  --  Add 2 additional days to program exercise sessions.  Add 2 additional days to program exercise sessions.   Initial Home Exercises Provided  --  --  --  12/22/19  12/22/19   Row Name 01/17/20 1600 02/03/20 0800           Response to Exercise   Blood Pressure (Admit)  134/72  128/64      Blood Pressure (Exercise)   130/64  118/62      Blood Pressure (Exit)  126/64  112/62      Heart Rate (Admit)  77 bpm  54 bpm      Heart Rate (Exercise)  96 bpm  87 bpm      Heart Rate (Exit)  82 bpm  77 bpm      Rating of Perceived Exertion (Exercise)  15  15      Symptoms  none  none      Duration  Continue with 30 min of aerobic exercise without signs/symptoms of physical distress.  Continue with 30 min of aerobic exercise without signs/symptoms of physical distress.      Intensity  THRR unchanged  THRR unchanged        Progression   Progression  Continue to progress workloads to maintain intensity without signs/symptoms of physical distress.  Continue to progress workloads to maintain intensity without signs/symptoms of physical distress.      Average METs  2.25  2.25  Resistance Training   Weight  3 lb  3 lb      Reps  10-15  10-15        Interval Training   Interval Training  No  No        Recumbant Bike   Level  3  --      [place]  15  --      Minutes  15  --      METs  2.5  --        NuStep   Level  3  4      SPM  --  80      Minutes  15  15      METs  2.3  2.5        Arm Ergometer   Level  1  --      Minutes  15  --      METs  2.2  --        Biostep-RELP   Level  3  2      SPM  --  50      Minutes  15  15      METs  2  2        Home Exercise Plan   Plans to continue exercise at  Home (comment) walk and stepper  Home (comment) walk and stepper      Frequency  Add 2 additional days to program exercise sessions.  Add 2 additional days to program exercise sessions.      Initial Home Exercises Provided  12/22/19  12/22/19         Exercise Comments: Exercise Comments    Row Name 12/01/19 1551           Exercise Comments  First full day of exercise!  Patient was oriented to gym and equipment including functions, settings, policies, and procedures.  Patient's individual exercise prescription and treatment plan were reviewed.  All starting workloads were established based on the  results of the 6 minute walk test done at initial orientation visit.  The plan for exercise progression was also introduced and progression will be customized based on patient's performance and goals.          Exercise Goals and Review: Exercise Goals    Row Name 11/28/19 1619             Exercise Goals   Increase Physical Activity  Yes       Intervention  Provide advice, education, support and counseling about physical activity/exercise needs.;Develop an individualized exercise prescription for aerobic and resistive training based on initial evaluation findings, risk stratification, comorbidities and participant's personal goals.       Expected Outcomes  Short Term: Attend rehab on a regular basis to increase amount of physical activity.;Long Term: Add in home exercise to make exercise part of routine and to increase amount of physical activity.;Long Term: Exercising regularly at least 3-5 days a week.       Increase Strength and Stamina  Yes       Intervention  Provide advice, education, support and counseling about physical activity/exercise needs.;Develop an individualized exercise prescription for aerobic and resistive training based on initial evaluation findings, risk stratification, comorbidities and participant's personal goals.       Expected Outcomes  Short Term: Increase workloads from initial exercise prescription for resistance, speed, and METs.;Short Term: Perform resistance training exercises routinely during rehab and add in resistance training at home;Long Term:  Improve cardiorespiratory fitness, muscular endurance and strength as measured by increased METs and functional capacity (6MWT)       Able to understand and use rate of perceived exertion (RPE) scale  Yes       Intervention  Provide education and explanation on how to use RPE scale       Expected Outcomes  Short Term: Able to use RPE daily in rehab to express subjective intensity level;Long Term:  Able to use RPE to  guide intensity level when exercising independently       Able to understand and use Dyspnea scale  Yes       Intervention  Provide education and explanation on how to use Dyspnea scale       Expected Outcomes  Short Term: Able to use Dyspnea scale daily in rehab to express subjective sense of shortness of breath during exertion;Long Term: Able to use Dyspnea scale to guide intensity level when exercising independently       Knowledge and understanding of Target Heart Rate Range (THRR)  Yes       Intervention  Provide education and explanation of THRR including how the numbers were predicted and where they are located for reference       Expected Outcomes  Short Term: Able to state/look up THRR;Short Term: Able to use daily as guideline for intensity in rehab;Long Term: Able to use THRR to govern intensity when exercising independently       Able to check pulse independently  Yes       Intervention  Provide education and demonstration on how to check pulse in carotid and radial arteries.;Review the importance of being able to check your own pulse for safety during independent exercise       Expected Outcomes  Short Term: Able to explain why pulse checking is important during independent exercise;Long Term: Able to check pulse independently and accurately       Understanding of Exercise Prescription  Yes       Intervention  Provide education, explanation, and written materials on patient's individual exercise prescription       Expected Outcomes  Short Term: Able to explain program exercise prescription;Long Term: Able to explain home exercise prescription to exercise independently       Improve claudication pain toleration; Improve walking ability  Yes       Intervention  Participate in PAD/SET Rehab 2-3 days a week, walking at home as part of exercise prescription;Attend education sessions to aid in risk factor modification and understanding of disease process       Expected Outcomes  Short Term:  Improve walking distance/time to onset of claudication pain;Long Term: Improve walking ability and toleration to claudication;Long Term: Improve score of PAD questionnaires          Exercise Goals Re-Evaluation : Exercise Goals Re-Evaluation    Row Name 12/01/19 1551 12/07/19 0920 12/21/19 1420 12/22/19 1521 12/26/19 1534     Exercise Goal Re-Evaluation   Exercise Goals Review  Able to understand and use rate of perceived exertion (RPE) scale;Knowledge and understanding of Target Heart Rate Range (THRR);Able to check pulse independently;Understanding of Exercise Prescription  --  Increase Physical Activity;Increase Strength and Stamina;Understanding of Exercise Prescription  Increase Physical Activity;Increase Strength and Stamina;Understanding of Exercise Prescription;Able to understand and use rate of perceived exertion (RPE) scale;Able to understand and use Dyspnea scale;Knowledge and understanding of Target Heart Rate Range (THRR);Able to check pulse independently  Increase Physical Activity;Increase Strength and Stamina;Able to understand  and use rate of perceived exertion (RPE) scale;Knowledge and understanding of Target Heart Rate Range (THRR)   Comments  Reviewed RPE scale, THR and program prescription with pt today.  Pt voiced understanding and was given a copy of goals to take home.  Izell [place] has moved up to level 3 on NS and Biostep.  He is working at correct THR range.  Murrell [place] is doing well in rehab. He is now up to 0.8 mph on the treadmill and most days is getting about 10 min.  We will continue to montior his progress.  Reviewed home exercise with pt today.  Pt plans to walk and use stepper at home for exercise.  Reviewed THR, pulse, RPE, sign and symptoms, NTG use, and when to call 911 or MD.  Also discussed weather considerations and indoor options.  Pt voiced understanding.  Izell [place] asked about doing more strength work.  We discussed getting 5 lb weights as he has 3 lb and doing 2-3 sets of  exercises in HE packet or using videos.   Expected Outcomes  Short: Use RPE daily to regulate intensity. Long: Follow program prescription in THR.  Short - attend HT consistently Long: improve MET level  Short: Continue to improve stamina on treadmill.  Long; Continue to improve stamina  SHort: Start to add in exercise at home.  Long: Continue to improve stamina.  Short: try videos Long:  continue to improve stamina   Row Name 01/05/20 0914 01/17/20 1603 02/01/20 1511         Exercise Goal Re-Evaluation   Exercise Goals Review  Increase Physical Activity;Increase Strength and Stamina;Able to understand and use rate of perceived exertion (RPE) scale;Able to understand and use Dyspnea scale;Understanding of Exercise Prescription  Increase Physical Activity;Increase Strength and Stamina;Understanding of Exercise Prescription  --     Comments  Willie tolerates exercise well.  He works at correct RPE ranges.  Staff will monitor progress.  Izell [place] has been doing well in rehab.  He is now up to level 3 on the BioStep.  We will continue to monitor his progress.  Izell [place] has been doing well in rehab.  He is now up to level 3 on the BioStep.  We will continue to monitor his progress. Pt has a Nustep 1x/week 20 minutes at 5 (RPE 14-15). Discussed resistence training, just ordered 5 lb weights. Discussed how that could be a good goal for next month.     Expected Outcomes  Short:  exercise consistently between program sessions and home Long:  improve overall MET level  Short: Increase arm crank  Long: Continue to improve stamina.  Short: Increase arm crank, increase Nustep to 2x/week  Long: Continue to improve stamina.        Discharge Exercise Prescription (Final Exercise Prescription Changes): Exercise Prescription Changes - 02/03/20 0800      Response to Exercise   Blood Pressure (Admit)  128/64    Blood Pressure (Exercise)  118/62    Blood Pressure (Exit)  112/62    Heart Rate (Admit)  54 bpm    Heart Rate  (Exercise)  87 bpm    Heart Rate (Exit)  77 bpm    Rating of Perceived Exertion (Exercise)  15    Symptoms  none    Duration  Continue with 30 min of aerobic exercise without signs/symptoms of physical distress.    Intensity  THRR unchanged      Progression   Progression  Continue to progress workloads to maintain intensity without  signs/symptoms of physical distress.    Average METs  2.25      Resistance Training   Weight  3 lb    Reps  10-15      Interval Training   Interval Training  No      NuStep   Level  4    SPM  80    Minutes  15    METs  2.5      Biostep-RELP   Level  2    SPM  50    Minutes  15    METs  2      Home Exercise Plan   Plans to continue exercise at  Home (comment)   walk and stepper   Frequency  Add 2 additional days to program exercise sessions.    Initial Home Exercises Provided  12/22/19       Nutrition:  Target Goals: Understanding of nutrition guidelines, daily intake of sodium <1532m, cholesterol <204m calories 30% from fat and 7% or less from saturated fats, daily to have 5 or more servings of fruits and vegetables.  Education: Controlling Sodium/Reading Food Labels -Group verbal and written material supporting the discussion of sodium use in heart healthy nutrition. Review and explanation with models, verbal and written materials for utilization of the food label.   Education: General Nutrition Guidelines/Fats and Fiber: -Group instruction provided by verbal, written material, models and posters to present the general guidelines for heart healthy nutrition. Gives an explanation and review of dietary fats and fiber.   Biometrics: Pre Biometrics - 11/28/19 1620      Pre Biometrics   Height  5' 9.5" (1.765 m)    Weight  195 lb 11.2 oz (88.8 kg)    BMI (Calculated)  28.5    Single Leg Stand  8.82 seconds        Nutrition Therapy Plan and Nutrition Goals: Nutrition Therapy & Goals - 12/28/19 1301      Nutrition Therapy    Diet  HH, Low Na    Drug/Food Interactions  Statins/Certain Fruits    Protein (specify units)  70-75g    Fiber  30 grams    Whole Grain Foods  3 servings    Saturated Fats  12 max. grams    Fruits and Vegetables  5 servings/day    Sodium  1.5 grams      Personal Nutrition Goals   Nutrition Goal  ST: Lower Na in diet LT: Increase knowledge    Comments  Pt reports liking vegetables. "too much of the wrong kind". Pt reports eating meat and trying to include more fish. Discussed HH and CKD friendly eating. Pt has been doing research and is motivated.      Intervention Plan   Intervention  Prescribe, educate and counsel regarding individualized specific dietary modifications aiming towards targeted core components such as weight, hypertension, lipid management, diabetes, heart failure and other comorbidities.;Nutrition handout(s) given to patient.    Expected Outcomes  Short Term Goal: Understand basic principles of dietary content, such as calories, fat, sodium, cholesterol and nutrients.;Short Term Goal: A plan has been developed with personal nutrition goals set during dietitian appointment.;Long Term Goal: Adherence to prescribed nutrition plan.       Nutrition Assessments:   MEDIFICTS Score Key:          ?70 Need to make dietary changes          40-70 Heart Healthy Diet         ? 40[place]  Therapeutic Level Cholesterol Diet  Nutrition Goals Re-Evaluation: Nutrition Goals Re-Evaluation    [place] Name 02/01/20 1515             Goals   Nutrition Goal  ST: mindful eating LT: Increase knowledge       Comment  Pt reports watching what he is eating, now not adding extra salt to food. Pt reports eating less processed foods. Pt wants to lose weight, talked about mindful eating.       Expected Outcome  ST: mindful eating LT: Increase knowledge          Nutrition Goals Discharge (Final Nutrition Goals Re-Evaluation): Nutrition Goals Re-Evaluation - 02/01/20 1515      Goals   Nutrition Goal   ST: mindful eating LT: Increase knowledge    Comment  Pt reports watching what he is eating, now not adding extra salt to food. Pt reports eating less processed foods. Pt wants to lose weight, talked about mindful eating.    Expected Outcome  ST: mindful eating LT: Increase knowledge       Psychosocial: Target Goals: Acknowledge presence or absence of significant depression and/or stress, maximize coping skills, provide positive support system. Participant is able to verbalize types and ability to use techniques and skills needed for reducing stress and depression.   Education: Depression - Provides group verbal and written instruction on the correlation between heart/lung disease and depressed mood, treatment options, and the stigmas associated with seeking treatment.   Education: Sleep Hygiene -Provides group verbal and written instruction about how sleep can affect your health.  Define sleep hygiene, discuss sleep cycles and impact of sleep habits. Review good sleep hygiene tips.     Education: Stress and Anxiety: - Provides group verbal and written instruction about the health risks of elevated stress and causes of high stress.  Discuss the correlation between heart/lung disease and anxiety and treatment options. Review healthy ways to manage with stress and anxiety.    Initial Review & Psychosocial Screening: Initial Psych Review & Screening - 11/21/19 1415      Initial Review   Current issues with  Current Sleep Concerns;Current Stress Concerns    Source of Stress Concerns  Chronic Illness;Family    Comments  having to get up to use bathroom      Family Dynamics   Good Support System?  Yes      Barriers   Psychosocial barriers to participate in program  There are no identifiable barriers or psychosocial needs.;The patient should benefit from training in stress management and relaxation.      Screening Interventions   Interventions  Encouraged to exercise;To provide support  and resources with identified psychosocial needs;Provide feedback about the scores to participant    Expected Outcomes  Short Term goal: Utilizing psychosocial counselor, staff and physician to assist with identification of specific Stressors or current issues interfering with healing process. Setting desired goal for each stressor or current issue identified.;Long Term Goal: Stressors or current issues are controlled or eliminated.;Short Term goal: Identification and review with participant of any Quality of Life or Depression concerns found by scoring the questionnaire.;Long Term goal: The participant improves quality of Life and PHQ9 Scores as seen by post scores and/or verbalization of changes       Quality of Life Scores:   Scores of 19 and below usually indicate a poorer quality of life in these areas.  A difference of  2-3 points is a clinically meaningful difference.  A difference of  2-3 points in the total score of the Quality of Life Index has been associated with significant improvement in overall quality of life, self-image, physical symptoms, and general health in studies assessing change in quality of life.  PHQ-9: Recent Review Flowsheet Data    Depression screen [facility] 2/9 11/28/2019 09/13/2018 08/24/2017 08/21/2016 08/14/2015   Decreased Interest 0 0 0 0 0   Down, Depressed, Hopeless 0 0 0 0 0   PHQ - 2 Score 0 0 0 0 0   Altered sleeping 0 - - - -   Tired, decreased energy 3 - - - -   Change in appetite 0 - - - -   Feeling bad or failure about yourself  0 - - - -   Trouble concentrating 0 - - - -   Moving slowly or fidgety/restless 0 - - - -   Suicidal thoughts 0 - - - -   PHQ-9 Score 3 - - - -   Difficult doing work/chores Somewhat difficult - - - -     Interpretation of Total Score  Total Score Depression Severity:  1-4 = Minimal depression, 5-9 = Mild depression, 10-14 = Moderate depression, 15-19 = Moderately severe depression, 20-27 = Severe depression   Psychosocial  Evaluation and Intervention: Psychosocial Evaluation - 11/21/19 1422      Psychosocial Evaluation & Interventions   Comments  Izell [place] 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 He has a good support system. He helps care for his wife who had a stroke recently, so that keeps him busy. Taking time for himself may be difficult, but he wants to take care of himself for her. He does have a $20 copay but he is going to try to figure something out    Expected Outcomes  Short: attend cardiac rehab as much as possible. Long: develop positive self care habits.    Continue Psychosocial Services   Follow up required by staff       Psychosocial Re-Evaluation: Psychosocial Re-Evaluation    [place] Name 12/26/19 1530 02/01/20 1514           Psychosocial Re-Evaluation   Current issues with  Current Sleep Concerns  Current Sleep Concerns      Comments  Izell [place] states he sleeps well - he gets up to go the bathroom but doesnt have trouble going back to sleep.  He states he doesnt have any stress at this time.  Izell [place] states he sleeps well - he gets up to go the bathroom but doesnt have trouble going back to sleep.  He states he doesnt have any stress at this time.      Expected Outcomes  Short : maintain good sleep and stress management Long:  maintain good coping skills  Short : maintain good sleep and stress management Long:  maintain good coping skills      Interventions  Encouraged to attend Cardiac Rehabilitation for the exercise  Encouraged to attend Cardiac Rehabilitation for the exercise      Continue Psychosocial Services   --  Follow up required by staff         Psychosocial Discharge (Final Psychosocial Re-Evaluation): Psychosocial Re-Evaluation - 02/01/20 1514      Psychosocial Re-Evaluation   Current issues with  Current Sleep Concerns    Comments  Izell [place] states he sleeps well - he gets up to go the bathroom but doesnt have trouble going back to sleep.  He states he doesnt have any stress at this time.  Expected Outcomes  Short : maintain good sleep and stress management Long:  maintain good coping skills    Interventions  Encouraged to attend Cardiac Rehabilitation for the exercise    Continue Psychosocial Services   Follow up required by staff       Vocational Rehabilitation: Provide vocational rehab assistance to qualifying candidates.   Vocational Rehab Evaluation & Intervention: Vocational Rehab - 11/21/19 1422      Initial Vocational Rehab Evaluation & Intervention   Assessment shows need for Vocational Rehabilitation  No       Education: Education Goals: Education classes will be provided on a variety of topics geared toward better understanding of heart health and risk factor modification. Participant will state understanding/return demonstration of topics presented as noted by education test scores.  Learning Barriers/Preferences: Learning Barriers/Preferences - 11/21/19 1422      Learning Barriers/Preferences   Learning Barriers  Hearing    Learning Preferences  None       General Cardiac Education Topics:  AED/CPR: - Group verbal and written instruction with the use of models to demonstrate the basic use of the AED with the basic ABC's of resuscitation.   Anatomy & Physiology of the Heart: - Group verbal and written instruction and models provide basic cardiac anatomy and physiology, with the coronary electrical and arterial systems. Review of Valvular disease and Heart Failure   Cardiac Procedures: - Group verbal and written instruction to review commonly prescribed medications for heart disease. Reviews the medication, class of the drug, and side effects. Includes the steps to properly store meds and maintain the prescription regimen. (beta blockers and nitrates)   Cardiac Medications I: - Group verbal and written instruction to review commonly prescribed medications for heart disease. Reviews the medication, class of the drug, and side effects. Includes  the steps to properly store meds and maintain the prescription regimen.   Cardiac Medications II: -Group verbal and written instruction to review commonly prescribed medications for heart disease. Reviews the medication, class of the drug, and side effects. (all other drug classes)    Go Sex-Intimacy & Heart Disease, Get SMART - Goal Setting: - Group verbal and written instruction through game format to discuss heart disease and the return to sexual intimacy. Provides group verbal and written material to discuss and apply goal setting through the application of the S.M.A.R.T. Method.   Other Matters of the Heart: - Provides group verbal, written materials and models to describe Stable Angina and Peripheral Artery. Includes description of the disease process and treatment options available to the cardiac patient.   Infection Prevention: - Provides verbal and written material to individual with discussion of infection control including proper hand washing and proper equipment cleaning during exercise session.   Cardiac Rehab from 11/28/2019 in [facility] Cardiac and Pulmonary Rehab  Date  11/28/19  Educator  AS  Instruction Review Code  1- Verbalizes Understanding      Falls Prevention: - Provides verbal and written material to individual with discussion of falls prevention and safety.   Cardiac Rehab from 11/28/2019 in [facility] Cardiac and Pulmonary Rehab  Date  11/28/19  Educator  AS  Instruction Review Code  1- Verbalizes Understanding      Other: -Provides group and verbal instruction on various topics (see comments)   Knowledge Questionnaire Score:   Core Components/Risk Factors/Patient Goals at Admission: Personal Goals and Risk Factors at Admission - 11/28/19 1625      Core Components/Risk Factors/Patient Goals on Admission  Weight Management  Yes    Intervention  Weight Management: Develop a combined nutrition and exercise program designed to reach desired caloric intake,  while maintaining appropriate intake of nutrient and fiber, sodium and fats, and appropriate energy expenditure required for the weight goal.;Weight Management: Provide education and appropriate resources to help participant work on and attain dietary goals.    Admit Weight  195 lb 11.2 oz (88.8 kg)       Education:Diabetes - Individual verbal and written instruction to review signs/symptoms of diabetes, desired ranges of glucose level fasting, after meals and with exercise. Acknowledge that pre and post exercise glucose checks will be done for 3 sessions at entry of program.   Education: Know Your Numbers and Risk Factors: -Group verbal and written instruction about important numbers in your health.  Discussion of what are risk factors and how they play a role in the disease process.  Review of Cholesterol, Blood Pressure, Diabetes, and BMI and the role they play in your overall health.   Core Components/Risk Factors/Patient Goals Review:  Goals and Risk Factor Review    Row Name 12/26/19 1529 02/01/20 1519           Core Components/Risk Factors/Patient Goals Review   Personal Goals Review  Weight Management/Obesity;Hypertension;Lipids;Other  Weight Management/Obesity;Hypertension;Lipids;Other      Review  Izell [place] is taking meds as directed.  He has a BP monitor but hasnt been checking it at home.  Izell [place] is taking meds as directed.  He has a BP monitor but hasnt been checking it at home.      Expected Outcomes  Short - monitor BP once a day at home Long: maintain BP at levels recommended by Dr  Sheran Fava - monitor BP once a day at home (at least on the days not here) Long: maintain BP at levels recommended by Dr         Core Components/Risk Factors/Patient Goals at Discharge (Final Review):  Goals and Risk Factor Review - 02/01/20 1519      Core Components/Risk Factors/Patient Goals Review   Personal Goals Review  Weight Management/Obesity;Hypertension;Lipids;Other    Review  Izell [place] is  taking meds as directed.  He has a BP monitor but hasnt been checking it at home.    Expected Outcomes  Short - monitor BP once a day at home (at least on the days not here) Long: maintain BP at levels recommended by Dr       Evelena Peat Comments: ITP Comments    Row Name 11/21/19 1420 12/01/19 1551 12/14/19 0635 12/28/19 1304 01/11/20 0636   ITP Comments  Initial orientation compelted. Diagnosis can be found in [facility] 11/22. EP orientation scheduled fro 2/8 at 3pm  First full day of exercise!  Patient was oriented to gym and equipment including functions, settings, policies, and procedures.  Patient's individual exercise prescription and treatment plan were reviewed.  All starting workloads were established based on the results of the 6 minute walk test done at initial orientation visit.  The plan for exercise progression was also introduced and progression will be customized based on patient's performance and goals.  30 day chart review completed. ITP sent to Dr Zachery Dakins Medical Director, for review,changes as needed and signature. New to program  Completed Initial RD Eval  30 day chart review completed. ITP sent to Dr Zachery Dakins Medical Director, for review,changes as needed and signature. Continue with ITP if no changes requested   Row Name 02/08/20 [id number]  ITP Comments  30 Day review completed. Medical Director review done, changes made as directed,and approval shown by signature of Market researcher.          Comments:

## 2020-02-09 ENCOUNTER — Encounter: Payer: Medicare PPO | Admitting: *Deleted

## 2020-02-09 ENCOUNTER — Other Ambulatory Visit: Payer: Self-pay

## 2020-02-09 DIAGNOSIS — Z951 Presence of aortocoronary bypass graft: Secondary | ICD-10-CM | POA: Diagnosis not present

## 2020-02-09 DIAGNOSIS — I214 Non-ST elevation (NSTEMI) myocardial infarction: Secondary | ICD-10-CM

## 2020-02-09 NOTE — Progress Notes (Signed)
Daily Session Note  Patient Details  Name: CULLAN LAUNER MRN: [id number] Date of Birth: 11-24-1933 Referring Provider:     Cardiac Rehab from 11/28/2019 in [facility] Cardiac and Pulmonary Rehab  Referring Provider  [name]      Encounter Date: 02/09/2020  Check In: Session Check In - 02/09/20 1516      Check-In   Supervising physician immediately available to respond to emergencies  See telemetry face sheet for immediately available ER MD    Location  [facility]-Cardiac & Pulmonary Rehab    Staff Present  Renita Papa, RN BSN;Joseph Foy [place], [place], ACSM CEP, Exercise Physiologist    Virtual Visit  No    Medication changes reported      No    Fall or balance concerns reported     No    Warm-up and Cool-down  Performed on first and last piece of equipment    Resistance Training Performed  Yes    VAD Patient?  No    PAD/SET Patient?  No      Pain Assessment   Currently in Pain?  No/denies          Social History   Tobacco Use  Smoking Status Former Smoker  . Types: Cigarettes  . Quit date: 10/20/1992  . Years since quitting: 27.3  Smokeless Tobacco Never Used  Tobacco Comment   quit in 1994     Goals Met:  Independence with exercise equipment Exercise tolerated well No report of cardiac concerns or symptoms Strength training completed today  Goals Unmet:  Not Applicable  Comments: Pt able to follow exercise prescription today without complaint.  Will continue to monitor for progression.    Dr. Emily Filbert is Medical Director for [place] and [facility].

## 2020-02-13 ENCOUNTER — Encounter: Payer: Medicare PPO | Admitting: *Deleted

## 2020-02-13 ENCOUNTER — Other Ambulatory Visit: Payer: Self-pay

## 2020-02-13 DIAGNOSIS — Z951 Presence of aortocoronary bypass graft: Secondary | ICD-10-CM

## 2020-02-13 DIAGNOSIS — I214 Non-ST elevation (NSTEMI) myocardial infarction: Secondary | ICD-10-CM | POA: Diagnosis not present

## 2020-02-13 NOTE — Progress Notes (Signed)
Daily Session Note  Patient Details  Name: Brandon Lewis MRN: [id number] Date of Birth: Jun 26, 1934 Referring Provider:     Cardiac Rehab from 11/28/2019 in [facility] Cardiac and Pulmonary Rehab  Referring Provider  [name]      Encounter Date: 02/13/2020  Check In: Session Check In - 02/13/20 1518      Check-In   Supervising physician immediately available to respond to emergencies  See telemetry face sheet for immediately available ER MD    Location  [facility]-Cardiac & Pulmonary Rehab    Staff Present  Renita Papa, RN Moises Blood, BS, ACSM CEP, Exercise Physiologist;Joseph Tessie Fass RCP,RRT,BSRT    Virtual Visit  No    Medication changes reported      No    Fall or balance concerns reported     No    Warm-up and Cool-down  Performed on first and last piece of equipment    Resistance Training Performed  Yes    VAD Patient?  No    PAD/SET Patient?  No      Pain Assessment   Currently in Pain?  No/denies          Social History   Tobacco Use  Smoking Status Former Smoker  . Types: Cigarettes  . Quit date: 10/20/1992  . Years since quitting: 27.3  Smokeless Tobacco Never Used  Tobacco Comment   quit in 1994     Goals Met:  Independence with exercise equipment Exercise tolerated well No report of cardiac concerns or symptoms Strength training completed today  Goals Unmet:  Not Applicable  Comments: Pt able to follow exercise prescription today without complaint.  Will continue to monitor for progression.     Dr. Emily Filbert is Medical Director for [place] and [facility].

## 2020-02-15 ENCOUNTER — Encounter: Payer: Medicare PPO | Admitting: *Deleted

## 2020-02-15 ENCOUNTER — Other Ambulatory Visit: Payer: Self-pay

## 2020-02-15 DIAGNOSIS — Z951 Presence of aortocoronary bypass graft: Secondary | ICD-10-CM | POA: Diagnosis not present

## 2020-02-15 DIAGNOSIS — I214 Non-ST elevation (NSTEMI) myocardial infarction: Secondary | ICD-10-CM

## 2020-02-15 NOTE — Progress Notes (Signed)
Daily Session Note  Patient Details  Name: Brandon Lewis MRN: [id number] Date of Birth: 03-04-1934 Referring Provider:     Cardiac Rehab from 11/28/2019 in [facility] Cardiac and Pulmonary Rehab  Referring Provider  [name]      Encounter Date: 02/15/2020  Check In: Session Check In - 02/15/20 1508      Check-In   Supervising physician immediately available to respond to emergencies  See telemetry face sheet for immediately available ER MD    Location  [facility]-Cardiac & Pulmonary Rehab    Staff Present  Renita Papa, RN BSN;Melissa Caiola RDN, Rowe Pavy, BA, ACSM CEP, Exercise Physiologist    Virtual Visit  No    Medication changes reported      No    Warm-up and Cool-down  Performed on first and last piece of equipment    Resistance Training Performed  Yes    VAD Patient?  No    PAD/SET Patient?  No      Pain Assessment   Currently in Pain?  No/denies          Social History   Tobacco Use  Smoking Status Former Smoker  . Types: Cigarettes  . Quit date: 10/20/1992  . Years since quitting: 27.3  Smokeless Tobacco Never Used  Tobacco Comment   quit in 1994     Goals Met:  Independence with exercise equipment Exercise tolerated well No report of cardiac concerns or symptoms Strength training completed today  Goals Unmet:  Not Applicable  Comments: Pt able to follow exercise prescription today without complaint.  Will continue to monitor for progression.    Dr. Emily Filbert is Medical Director for [place] and [facility].

## 2020-02-16 ENCOUNTER — Encounter: Payer: Medicare PPO | Admitting: *Deleted

## 2020-02-16 ENCOUNTER — Other Ambulatory Visit: Payer: Self-pay

## 2020-02-16 DIAGNOSIS — Z951 Presence of aortocoronary bypass graft: Secondary | ICD-10-CM

## 2020-02-16 DIAGNOSIS — I214 Non-ST elevation (NSTEMI) myocardial infarction: Secondary | ICD-10-CM | POA: Diagnosis not present

## 2020-02-16 NOTE — Progress Notes (Signed)
Daily Session Note  Patient Details  Name: Brandon Lewis MRN: [id number] Date of Birth: [birth year] Referring Provider:     Cardiac Rehab from 11/28/2019 in [facility] Cardiac and Pulmonary Rehab  Referring Provider  [name]      Encounter Date: 02/16/2020  Check In: Session Check In - 02/16/20 1501      Check-In   Supervising physician immediately available to respond to emergencies  See telemetry face sheet for immediately available ER MD    Location  [facility]-Cardiac & Pulmonary Rehab    Staff Present  Renita Papa, RN BSN;Joseph [address] [place], [place], [place], CCRP, CCET    Virtual Visit  No    Medication changes reported      No    Fall or balance concerns reported     No    Warm-up and Cool-down  Performed on first and last piece of equipment    Resistance Training Performed  Yes    VAD Patient?  No    PAD/SET Patient?  No      Pain Assessment   Currently in Pain?  No/denies          Social History   Tobacco Use  Smoking Status Former Smoker  . Types: Cigarettes  . Quit date: 10/20/1992  . Years since quitting: 27.3  Smokeless Tobacco Never Used  Tobacco Comment   quit in 1994     Goals Met:  Independence with exercise equipment Exercise tolerated well No report of cardiac concerns or symptoms Strength training completed today  Goals Unmet:  Not Applicable  Comments: Pt able to follow exercise prescription today without complaint.  Will continue to monitor for progression.    Dr. Emily Filbert is Medical Director for [place] and [facility].

## 2020-02-20 ENCOUNTER — Other Ambulatory Visit: Payer: Self-pay

## 2020-02-20 ENCOUNTER — Encounter: Payer: Medicare PPO | Attending: Internal Medicine | Admitting: *Deleted

## 2020-02-20 DIAGNOSIS — I214 Non-ST elevation (NSTEMI) myocardial infarction: Secondary | ICD-10-CM | POA: Diagnosis not present

## 2020-02-20 DIAGNOSIS — Z951 Presence of aortocoronary bypass graft: Secondary | ICD-10-CM | POA: Diagnosis not present

## 2020-02-20 NOTE — Progress Notes (Signed)
Daily Session Note  Patient Details  Name: Brandon Lewis MRN: [id number] Date of Birth: 22-Jun-1934 Referring Provider:     Cardiac Rehab from 11/28/2019 in [facility] Cardiac and Pulmonary Rehab  Referring Provider  [name]      Encounter Date: 02/20/2020  Check In: Session Check In - 02/20/20 1511      Check-In   Supervising physician immediately available to respond to emergencies  See telemetry face sheet for immediately available ER MD    Location  [facility]-Cardiac & Pulmonary Rehab    Staff Present  Renita Papa, RN BSN;Joseph [address] [place], [place], ACSM CEP, Exercise Physiologist    Virtual Visit  No    Medication changes reported      No    Fall or balance concerns reported     No    Warm-up and Cool-down  Performed on first and last piece of equipment    Resistance Training Performed  Yes    VAD Patient?  No    PAD/SET Patient?  No      Pain Assessment   Currently in Pain?  No/denies          Social History   Tobacco Use  Smoking Status Former Smoker  . Types: Cigarettes  . Quit date: 10/20/1992  . Years since quitting: 27.3  Smokeless Tobacco Never Used  Tobacco Comment   quit in 1994     Goals Met:  Independence with exercise equipment Exercise tolerated well No report of cardiac concerns or symptoms Strength training completed today  Goals Unmet:  Not Applicable  Comments: Pt able to follow exercise prescription today without complaint.  Will continue to monitor for progression.    Dr. Emily Filbert is Medical Director for [place] and [facility].

## 2020-02-21 ENCOUNTER — Ambulatory Visit: Payer: Medicare PPO | Admitting: Podiatry

## 2020-02-21 ENCOUNTER — Other Ambulatory Visit: Payer: Self-pay

## 2020-02-21 DIAGNOSIS — L989 Disorder of the skin and subcutaneous tissue, unspecified: Secondary | ICD-10-CM

## 2020-02-21 DIAGNOSIS — B351 Tinea unguium: Secondary | ICD-10-CM | POA: Diagnosis not present

## 2020-02-21 DIAGNOSIS — M79676 Pain in unspecified toe(s): Secondary | ICD-10-CM

## 2020-02-22 ENCOUNTER — Encounter: Payer: Medicare PPO | Admitting: *Deleted

## 2020-02-22 DIAGNOSIS — I214 Non-ST elevation (NSTEMI) myocardial infarction: Secondary | ICD-10-CM | POA: Diagnosis not present

## 2020-02-22 DIAGNOSIS — Z951 Presence of aortocoronary bypass graft: Secondary | ICD-10-CM

## 2020-02-22 NOTE — Progress Notes (Signed)
Daily Session Note  Patient Details  Name: Brandon Lewis MRN: [id number] Date of Birth: [birth year] Referring Provider:     Cardiac Rehab from 11/28/2019 in [facility] Cardiac and Pulmonary Rehab  Referring Provider  [name]      Encounter Date: 02/22/2020  Check In: Session Check In - 02/22/20 1515      Check-In   Supervising physician immediately available to respond to emergencies  See telemetry face sheet for immediately available ER MD    Location  [facility]-Cardiac & Pulmonary Rehab    Staff Present  Renita Papa, RN BSN;Melissa Caiola RDN, Rowe Pavy, BA, ACSM CEP, Exercise Physiologist    Virtual Visit  No    Medication changes reported      No    Fall or balance concerns reported     No    Warm-up and Cool-down  Performed on first and last piece of equipment    Resistance Training Performed  Yes    VAD Patient?  No    PAD/SET Patient?  No      Pain Assessment   Currently in Pain?  No/denies          Social History   Tobacco Use  Smoking Status Former Smoker  . Types: Cigarettes  . Quit date: 10/20/1992  . Years since quitting: 27.3  Smokeless Tobacco Never Used  Tobacco Comment   quit in 1994     Goals Met:  Independence with exercise equipment Exercise tolerated well No report of cardiac concerns or symptoms Strength training completed today  Goals Unmet:  Not Applicable  Comments: Pt able to follow exercise prescription today without complaint.  Will continue to monitor for progression.    Dr. Emily Filbert is Medical Director for [place] and [facility].

## 2020-02-23 ENCOUNTER — Other Ambulatory Visit: Payer: Self-pay

## 2020-02-23 ENCOUNTER — Encounter: Payer: Medicare PPO | Admitting: *Deleted

## 2020-02-23 DIAGNOSIS — Z951 Presence of aortocoronary bypass graft: Secondary | ICD-10-CM | POA: Diagnosis not present

## 2020-02-23 DIAGNOSIS — I214 Non-ST elevation (NSTEMI) myocardial infarction: Secondary | ICD-10-CM | POA: Diagnosis not present

## 2020-02-23 NOTE — Progress Notes (Signed)
Daily Session Note  Patient Details  Name: Brandon Lewis MRN: [id number] Date of Birth: 1933/12/05 Referring Provider:     Cardiac Rehab from 11/28/2019 in [facility] Cardiac and Pulmonary Rehab  Referring Provider  [name]      Encounter Date: 02/23/2020  Check In: Session Check In - 02/23/20 1503      Check-In   Supervising physician immediately available to respond to emergencies  See telemetry face sheet for immediately available ER MD    Location  [facility]-Cardiac & Pulmonary Rehab    Staff Present  Renita Papa, RN BSN;Joseph [address] [place], [place], [place], CCRP, CCET    Virtual Visit  No    Medication changes reported      No    Fall or balance concerns reported     No    Warm-up and Cool-down  Performed on first and last piece of equipment    Resistance Training Performed  Yes    VAD Patient?  No    PAD/SET Patient?  No      Pain Assessment   Currently in Pain?  No/denies          Social History   Tobacco Use  Smoking Status Former Smoker  . Types: Cigarettes  . Quit date: 10/20/1992  . Years since quitting: 27.3  Smokeless Tobacco Never Used  Tobacco Comment   quit in 1994     Goals Met:  Independence with exercise equipment Exercise tolerated well No report of cardiac concerns or symptoms Strength training completed today  Goals Unmet:  Not Applicable  Comments: Pt able to follow exercise prescription today without complaint.  Will continue to monitor for progression.    Dr. Emily Filbert is Medical Director for [place] and [facility].

## 2020-02-27 ENCOUNTER — Other Ambulatory Visit: Payer: Self-pay

## 2020-02-27 ENCOUNTER — Encounter: Payer: Medicare PPO | Admitting: *Deleted

## 2020-02-27 DIAGNOSIS — Z951 Presence of aortocoronary bypass graft: Secondary | ICD-10-CM

## 2020-02-27 DIAGNOSIS — I214 Non-ST elevation (NSTEMI) myocardial infarction: Secondary | ICD-10-CM

## 2020-02-27 NOTE — Progress Notes (Signed)
Daily Session Note  Patient Details  Name: Brandon Lewis MRN: [id number] Date of Birth: [birth year] Referring Provider:     Cardiac Rehab from 11/28/2019 in [facility] Cardiac and Pulmonary Rehab  Referring Provider  [name]      Encounter Date: 02/27/2020  Check In: Session Check In - 02/27/20 1515      Check-In   Supervising physician immediately available to respond to emergencies  See telemetry face sheet for immediately available ER MD    Location  [facility]-Cardiac & Pulmonary Rehab    Staff Present  Renita Papa, RN Moises Blood, BS, ACSM CEP, Exercise Physiologist;Joseph Darrin Nipper, [place], RCEP, CCRP, CCET    Virtual Visit  No    Medication changes reported      No    Fall or balance concerns reported     No    Warm-up and Cool-down  Performed on first and last piece of equipment    Resistance Training Performed  Yes    VAD Patient?  No    PAD/SET Patient?  No      Pain Assessment   Currently in Pain?  No/denies          Social History   Tobacco Use  Smoking Status Former Smoker  . Types: Cigarettes  . Quit date: 10/20/1992  . Years since quitting: 27.3  Smokeless Tobacco Never Used  Tobacco Comment   quit in 1994     Goals Met:  Independence with exercise equipment Exercise tolerated well No report of cardiac concerns or symptoms Strength training completed today  Goals Unmet:  Not Applicable  Comments: Pt able to follow exercise prescription today without complaint.  Will continue to monitor for progression.    Dr. Emily Filbert is Medical Director for [facility].

## 2020-02-27 NOTE — Progress Notes (Signed)
    Subjective: Patient is a 86 y.o. male presenting to the office today for follow up evaluation of painful callus lesion(s) noted to the bilateral feet. Walking and bearing weight increases the pain. He has not had any recent treatment for the symptoms.  Patient also complains of elongated, thickened nails that cause pain while ambulating in shoes. He is unable to trim his own nails. Patient presents today for further treatment and evaluation.  Past Medical History:  Diagnosis Date  . CAD (coronary artery disease)    s/p PCI after presenting with exertional angina (drug eluting stent)  . Diverticulosis   . GERD (gastroesophageal reflux disease)   . HTN (hypertension)   . Hyperlipidemia   . Osteoarthritis   . PVD (peripheral vascular disease) ([place])    s/p aortobifemoral bypass, with bilateral SFA occlusion and intermittent claudication   . Small bowel obstruction (HCC)     Objective:  Physical Exam General: Alert and oriented x3 in no acute distress  Dermatology: Hyperkeratotic lesion(s) present on the bilateral feet. Pain on palpation with a central nucleated core noted. Skin is warm, dry and supple bilateral lower extremities. Negative for open lesions or macerations. Nails are tender, long, thickened and dystrophic with subungual debris, consistent with onychomycosis, 1-5 bilateral. No signs of infection noted.  Vascular: Palpable pedal pulses bilaterally. No edema or erythema noted. Capillary refill within normal limits.  Neurological: Epicritic and protective threshold grossly intact bilaterally.   Musculoskeletal Exam: Pain on palpation at the keratotic lesion(s) noted. Range of motion within normal limits bilateral. Muscle strength 5/5 in all groups bilateral.  Assessment: 1. Onychodystrophic nails 1-5 bilateral with hyperkeratosis of nails.  2. Onychomycosis of nail due to dermatophyte bilateral 3. Pre-ulcerative callus lesions noted to the bilateral feet x 4   Plan of  Care:  1. Patient evaluated. 2. Excisional debridement of keratoic lesion(s) using a chisel blade was performed without incident.  3. Dressed with light dressing. 4. Mechanical debridement of nails 1-5 bilaterally performed using a nail nipper. Filed with dremel without incident.  5. Patient is to return to the clinic in 3 months.   Edrick Kins, DPM [facility]  Dr. Edrick Kins, [address]                                        [place], [place] [address]                Office [phone]  Fax [phone]

## 2020-02-29 ENCOUNTER — Encounter: Payer: Medicare PPO | Admitting: *Deleted

## 2020-02-29 ENCOUNTER — Other Ambulatory Visit: Payer: Self-pay

## 2020-02-29 VITALS — Ht 69.5 in | Wt 199.1 lb

## 2020-02-29 DIAGNOSIS — Z951 Presence of aortocoronary bypass graft: Secondary | ICD-10-CM | POA: Diagnosis not present

## 2020-02-29 DIAGNOSIS — I214 Non-ST elevation (NSTEMI) myocardial infarction: Secondary | ICD-10-CM | POA: Diagnosis not present

## 2020-02-29 NOTE — Progress Notes (Signed)
Daily Session Note  Patient Details  Name: Brandon Lewis MRN: [id number] Date of Birth: 1934/05/04 Referring Provider:     Cardiac Rehab from 11/28/2019 in [facility] Cardiac and Pulmonary Rehab  Referring Provider  [name]      Encounter Date: 02/29/2020  Check In: Session Check In - 02/29/20 1511      Check-In   Supervising physician immediately available to respond to emergencies  See telemetry face sheet for immediately available ER MD    Location  [facility]-Cardiac & Pulmonary Rehab    Staff Present  Alberteen Sam, MA, RCEP, CCRP, CCET;Melissa [place] RDN, LDN;Keya Wynes [place], RN Vickki Hearing, BA, ACSM CEP, Exercise Physiologist    Virtual Visit  No    Medication changes reported      No    Fall or balance concerns reported     No    Warm-up and Cool-down  Performed on first and last piece of equipment    Resistance Training Performed  Yes    VAD Patient?  No    PAD/SET Patient?  No      Pain Assessment   Currently in Pain?  No/denies          Social History   Tobacco Use  Smoking Status Former Smoker  . Types: Cigarettes  . Quit date: 10/20/1992  . Years since quitting: 27.3  Smokeless Tobacco Never Used  Tobacco Comment   quit in 1994     Goals Met:  Independence with exercise equipment Exercise tolerated well No report of cardiac concerns or symptoms Strength training completed today  Goals Unmet:  Not Applicable  Comments: Pt able to follow exercise prescription today without complaint.  Will continue to monitor for progression.  [place] Name 11/28/19 1609 02/29/20 1542       6 Minute Walk   Phase  Discharge  Discharge    Distance  610 feet  760 feet    Distance % Change  --  24.5 %    Distance Feet Change  --  150 ft    Walk Time  6 minutes  6 minutes    # of Rest Breaks  0  0    MPH  1.15  1.44    METS  1  1.51    RPE  15  17    Perceived Dyspnea   3  --    VO2 Peak  3.53  5.3    Symptoms  Yes (comment)  Yes (comment)     Comments  claudication leg pain 6/10 ( normal for him walking)  claudication leg pain 9/10    Resting HR  79 bpm  81 bpm    Resting BP  122/56  138/64    Resting Oxygen Saturation   97 %  --    Exercise Oxygen Saturation  during 6 min walk  99 %  --    Max Ex. HR  102 bpm  110 bpm    Max Ex. BP  134/54  158/74    2 Minute Post BP  130/56  --        Dr. Emily Filbert is Medical Director for [facility].

## 2020-03-01 ENCOUNTER — Encounter: Payer: Medicare PPO | Admitting: *Deleted

## 2020-03-01 ENCOUNTER — Other Ambulatory Visit: Payer: Self-pay

## 2020-03-01 DIAGNOSIS — Z951 Presence of aortocoronary bypass graft: Secondary | ICD-10-CM

## 2020-03-01 DIAGNOSIS — I214 Non-ST elevation (NSTEMI) myocardial infarction: Secondary | ICD-10-CM | POA: Diagnosis not present

## 2020-03-01 NOTE — Progress Notes (Signed)
Daily Session Note  Patient Details  Name: Brandon Lewis MRN: [id number] Date of Birth: September 11, 1934 Referring Provider:     Cardiac Rehab from 11/28/2019 in [facility] Cardiac and Pulmonary Rehab  Referring Provider  [name]      Encounter Date: 03/01/2020  Check In: Session Check In - 03/01/20 1500      Check-In   Supervising physician immediately available to respond to emergencies  See telemetry face sheet for immediately available ER MD    Location  [facility]Cardiac [facility] Pulmonary Rehab    Staff Present  Renita Papa, RN BSN;Joseph Hood RCP,RRT,BSRT;Melissa [place] RDN, LDN    Virtual Visit  No    Medication changes reported      No    Fall or balance concerns reported     No    Warm-up and Cool-down  Performed on first and last piece of equipment    Resistance Training Performed  Yes    VAD Patient?  No    PAD/SET Patient?  No      Pain Assessment   Currently in Pain?  No/denies          Social History   Tobacco Use  Smoking Status Former Smoker  . Types: Cigarettes  . Quit date: 10/20/1992  . Years since quitting: 27.3  Smokeless Tobacco Never Used  Tobacco Comment   quit in 1994     Goals Met:  Independence with exercise equipment Exercise tolerated well No report of cardiac concerns or symptoms Strength training completed today  Goals Unmet:  Not Applicable  Comments: Pt able to follow exercise prescription today without complaint.  Will continue to monitor for progression.    Dr. Emily Filbert is Medical Director for [facility] and [facility].

## 2020-03-05 ENCOUNTER — Other Ambulatory Visit: Payer: Self-pay

## 2020-03-05 ENCOUNTER — Encounter: Payer: Medicare PPO | Admitting: *Deleted

## 2020-03-05 DIAGNOSIS — Z951 Presence of aortocoronary bypass graft: Secondary | ICD-10-CM | POA: Diagnosis not present

## 2020-03-05 DIAGNOSIS — I214 Non-ST elevation (NSTEMI) myocardial infarction: Secondary | ICD-10-CM

## 2020-03-05 NOTE — Progress Notes (Signed)
Daily Session Note  Patient Details  Name: Brandon Lewis MRN: [id number] Date of Birth: May 12, 1934 Referring Provider:     Cardiac Rehab from 11/28/2019 in [facility] Cardiac and Pulmonary Rehab  Referring Provider  [name]      Encounter Date: 03/05/2020  Check In: Session Check In - 03/05/20 1514      Check-In   Supervising physician immediately available to respond to emergencies  See telemetry face sheet for immediately available ER MD    Location  [facility]Cardiac & Pulmonary Rehab    Staff Present  Renita Papa, RN BSN;Joseph [address] [place], [place], ACSM CEP, Exercise Physiologist    Virtual Visit  No    Medication changes reported      No    Fall or balance concerns reported     No    Warm-up and Cool-down  Performed on first and last piece of equipment    Resistance Training Performed  Yes    VAD Patient?  No    PAD/SET Patient?  No      Pain Assessment   Currently in Pain?  No/denies          Social History   Tobacco Use  Smoking Status Former Smoker  . Types: Cigarettes  . Quit date: 10/20/1992  . Years since quitting: 27.3  Smokeless Tobacco Never Used  Tobacco Comment   quit in 1994     Goals Met:  Independence with exercise equipment Exercise tolerated well No report of cardiac concerns or symptoms Strength training completed today  Goals Unmet:  Not Applicable  Comments: Pt able to follow exercise prescription today without complaint.  Will continue to monitor for progression.    Dr. Emily Filbert is Medical Director for [facility] and [facility].

## 2020-03-07 ENCOUNTER — Encounter: Payer: Self-pay | Admitting: *Deleted

## 2020-03-07 DIAGNOSIS — I214 Non-ST elevation (NSTEMI) myocardial infarction: Secondary | ICD-10-CM

## 2020-03-07 NOTE — Progress Notes (Signed)
Cardiac Individual Treatment Plan  Patient Details  Name: Brandon Lewis Date of Birth: [birth year] Referring Provider:     Cardiac Rehab from 11/28/2019 in [facility] Cardiac and Pulmonary Rehab  Referring Provider  Brandon Lewis      Initial Encounter Date:    Cardiac Rehab from 11/28/2019 in [facility] Cardiac and Pulmonary Rehab  Date  11/28/19      Visit Diagnosis: NSTEMI (non-ST elevated myocardial infarction) [facility])  Patient's Home Medications on Admission:  Current Outpatient Medications:  .  amiodarone (PACERONE) 200 MG tablet, Take 1 tablet (200 mg total) by mouth 2 (two) times daily. Take one tablet by mouth TWICE daily for 7 days  then take one tablet by mouth ONCE daily., Disp: 60 tablet, Rfl: 1 .  aspirin 81 MG chewable tablet, Chew 1 tablet (81 mg total) by mouth daily., Disp:  , Rfl:  .  cholecalciferol (VITAMIN D) 1000 UNITS tablet, Take 1,000 Units by mouth at bedtime. , Disp: , Rfl:  .  clopidogrel (PLAVIX) 75 MG tablet, TAKE ONE TABLET BY MOUTH EVERY DAY *BOTTLE*, Disp: 90 tablet, Rfl: 3 .  Magnesium 400 MG CAPS, Take by mouth daily., Disp: , Rfl:  .  metoprolol succinate (TOPROL-XL) 50 MG 24 hr tablet, **VIAL ONLY** TAKE (1) TABLET BY MOUTH ONCE A DAY.DO NOT CRUSH. (HIGHBLOOD PRESSURE) *BOTTLE*, Disp: 30 tablet, Rfl: 11 .  Multiple Vitamin (MULTIVITAMIN WITH MINERALS) TABS tablet, Take 1 tablet by mouth every morning., Disp: , Rfl:  .  rosuvastatin (CRESTOR) 10 MG tablet, **VIAL ONLY** TAKE ONE TABLET BY MOUTH AT BEDTIME. (IMPROVES CHOLESTEROL), Disp: 90 tablet, Rfl: 3 .  sodium chloride (BRONCHO SALINE) inhaler solution, Take 1 spray by nebulization daily as needed (congestion)., Disp: , Rfl:  .  tamsulosin (FLOMAX) 0.4 MG CAPS capsule, TAKE 1 CAPSULE BY MOUTH ONCE DAILY, Disp: 30 capsule, Rfl: 11  Current Facility-Administered Medications:  .  mometasone-formoterol (DULERA) 100-5 MCG/ACT inhaler 2 puff, 2 puff, Inhalation, BID, Atkins, Glenice Bow, MD  Past  Medical History: Past Medical History:  Diagnosis Date  . CAD (coronary artery disease)    s/p PCI after presenting with exertional angina (drug eluting stent)  . Diverticulosis   . GERD (gastroesophageal reflux disease)   . HTN (hypertension)   . Hyperlipidemia   . Osteoarthritis   . PVD (peripheral vascular disease) ([place])    s/p aortobifemoral bypass, with bilateral SFA occlusion and intermittent claudication   . Small bowel obstruction (HCC)     Tobacco Use: Social History   Tobacco Use  Smoking Status Former Smoker  . Types: Cigarettes  . Quit date: 10/20/1992  . Years since quitting: 27.3  Smokeless Tobacco Never Used  Tobacco Comment   quit in 1994     Labs: Recent Review Flowsheet Data    Labs for ITP Cardiac and Pulmonary Rehab Latest Ref Rng & Units 09/16/2019 09/16/2019 09/16/2019 09/16/2019 09/16/2019   Cholestrol 0 - 200 mg/dL - - - - -   LDLCALC 0 - 99 mg/dL - - - - -   LDLDIRECT mg/dL - - - - -   HDL >40 mg/dL - - - - -   Trlycerides <150 mg/dL - - - - -   Hemoglobin A1c 4.8 - 5.6 % - - - - -   PHART 7.350 - 7.450 - - 7.313(L) 7.330(L) 7.313(L)   PCO2ART 32.0 - 48.0 mmHg - - 42.7 29.1(L) 31.9(L)   HCO3 20.0 - 28.0 mmol/L - - 21.7 15.4(L) 16.2(L)  TCO2 22 - 32 mmol/L _0 16(L) 17(L)   ACIDBASEDEF 0.0 - 2.0 mmol/L - - 4.0(H) 10.0(H) 9.0(H)   O2SAT % - - 99.0 91.0 96.0       Exercise Target Goals: Exercise Program Goal: Individual exercise prescription set using results from initial 6 min walk test and THRR while considering  patient's activity barriers and safety.   Exercise Prescription Goal: Initial exercise prescription builds to 30-45 minutes a day of aerobic activity, 2-3 days per week.  Home exercise guidelines will be given to patient during program as part of exercise prescription that the participant will acknowledge.   Education: Aerobic Exercise & Resistance Training: - Gives group verbal and written instruction on the various  components of exercise. Focuses on aerobic and resistive training programs and the benefits of this training and how to safely progress through these programs..   Education: Exercise & Equipment Safety: - Individual verbal instruction and demonstration of equipment use and safety with use of the equipment.   Cardiac Rehab from 11/28/2019 in [facility] Cardiac and Pulmonary Rehab  Date  11/28/19  Educator  AS  Instruction Review Code  1- Verbalizes Understanding      Education: Exercise Physiology & General Exercise Guidelines: - Group verbal and written instruction with models to review the exercise physiology of the cardiovascular system and associated critical values. Provides general exercise guidelines with specific guidelines to those with heart or lung disease.    Education: Flexibility, Balance, Mind/Body Relaxation: Provides group verbal/written instruction on the benefits of flexibility and balance training, including mind/body exercise modes such as yoga, pilates and tai chi.  Demonstration and skill practice provided.   Activity Barriers & Risk Stratification: Activity Barriers & Cardiac Risk Stratification - 11/21/19 1415      Activity Barriers & Cardiac Risk Stratification   Activity Barriers  Other (comment);Back Problems    Comments  claudication pain    Cardiac Risk Stratification  High       6 Minute Walk: 6 Minute Walk    Row Name 11/28/19 1609 02/29/20 1542       6 Minute Walk   Phase  Discharge  Discharge    Distance  610 feet  760 feet    Distance % Change  -  24.5 %    Distance Feet Change  -  150 ft    Walk Time  6 minutes  6 minutes    # of Rest Breaks  0  0    MPH  1.15  1.44    METS  1  1.51    RPE  15  17    Perceived Dyspnea   3  -    VO2 Peak  3.53  5.3    Symptoms  Yes (comment)  Yes (comment)    Comments  claudication leg pain 6/10 ( normal for him walking)  claudication leg pain 9/10    Resting HR  79 bpm  81 bpm    Resting BP  122/56  138/64     Resting Oxygen Saturation   97 %  -    Exercise Oxygen Saturation  during 6 min walk  99 %  -    Max Ex. HR  102 bpm  110 bpm    Max Ex. BP  134/54  158/74    2 Minute Post BP  130/56  -       Oxygen Initial Assessment:   Oxygen Re-Evaluation:   Oxygen Discharge (Final Oxygen Re-Evaluation):  Initial Exercise Prescription: Initial Exercise Prescription - 11/28/19 1600      Date of Initial Exercise RX and Referring Provider   Date  11/28/19    Referring Provider  Brandon Lewis      Treadmill   MPH  1    Grade  0    Minutes  15    METs  1.77      NuStep   Level  1    SPM  80    Minutes  15    METs  1      Recumbant Elliptical   Level  1    RPM  50    Minutes  15    METs  1      REL-XR   Level  1    Speed  50    Minutes  15    METs  1      Prescription Details   Frequency (times per week)  3    Duration  Progress to 30 minutes of continuous aerobic without signs/symptoms of physical distress      Intensity   THRR 40-80% of Max Heartrate  102-125    Ratings of Perceived Exertion  11-13    Perceived Dyspnea  0-4       Perform Capillary Blood Glucose checks as needed.  Exercise Prescription Changes: Exercise Prescription Changes    Row Name 11/28/19 1600 12/07/19 0900 12/21/19 1400 12/22/19 1500 01/05/20 0900     Response to Exercise   Blood Pressure (Admit)  122/56  120/60  128/74  -  110/60   Blood Pressure (Exercise)  134/54  116/62  136/70  -  140/64   Blood Pressure (Exit)  130/56  116/58  132/70  -  116/58   Heart Rate (Admit)  79 bpm  79 bpm  88 bpm  -  86 bpm   Heart Rate (Exercise)  102 bpm  115 bpm  91 bpm  -  99 bpm   Heart Rate (Exit)  86 bpm  89 bpm  83 bpm  -  85 bpm   Oxygen Saturation (Admit)  97 %  -  -  -  -   Oxygen Saturation (Exercise)  99 %  -  -  -  -   Rating of Perceived Exertion (Exercise)  _0 -  15   Perceived Dyspnea (Exercise)  3  -  -  -  -   Symptoms  claudication leg pain  -  fatigue  -  -   Comments  -   second day  -  -  -   Duration  -  Progress to 30 minutes of  aerobic without signs/symptoms of physical distress  Progress to 30 minutes of  aerobic without signs/symptoms of physical distress  -  Continue with 30 min of aerobic exercise without signs/symptoms of physical distress.   Intensity  -  THRR unchanged  THRR unchanged  -  THRR unchanged     Progression   Progression  -  Continue to progress workloads to maintain intensity without signs/symptoms of physical distress.  Continue to progress workloads to maintain intensity without signs/symptoms of physical distress.  -  Continue to progress workloads to maintain intensity without signs/symptoms of physical distress.   Average METs  -  2  1.8  -  2     Resistance Training   Weight  -  3 lb  3 lb  -  3  lb   Reps  -  10-15  10-15  -  10-15     Interval Training   Interval Training  -  No  No  -  No     Treadmill   MPH  -  -  0.8  -  -   Grade  -  -  15  -  -   Minutes  -  -  10  -  -   METs  -  -  1.6  -  -     Recumbant Bike   Level  -  -  -  -  3   RPM  -  -  -  -  60   Minutes  -  -  -  -  15   METs  -  -  -  -  1.8     NuStep   Level  -  3  -  -  -   SPM  -  80  -  -  -   Minutes  -  15  -  -  -   METs  -  2.3  -  -  -     Arm Ergometer   Level  -  -  -  -  1   Minutes  -  -  -  -  15   METs  -  -  -  -  2.1     T5 Nustep   Level  -  -  3  -  -   Minutes  -  -  15  -  -   METs  -  -  1.8  -  -     Biostep-RELP   Level  -  3  -  -  -   SPM  -  50  -  -  -   Minutes  -  15  -  -  -   METs  -  2  -  -  -     Home Exercise Plan   Plans to continue exercise at  -  -  -  Home (comment) walk and stepper  Home (comment) walk and stepper   Frequency  -  -  -  Add 2 additional days to program exercise sessions.  Add 2 additional days to program exercise sessions.   Initial Home Exercises Provided  -  -  -  12/22/19  12/22/19   Row Name 01/17/20 1600 02/03/20 0800 02/14/20 1600 02/28/20 1200 02/28/20 1400      Response to Exercise   Blood Pressure (Admit)  134/72  128/64  110/58  110/58  -   Blood Pressure (Exercise)  130/64  118/62  142/62  118/64  -   Blood Pressure (Exit)  126/64  112/62  118/64  120/70  -   Heart Rate (Admit)  77 bpm  54 bpm  80 bpm  92 bpm  -   Heart Rate (Exercise)  96 bpm  87 bpm  121 bpm  113 bpm  -   Heart Rate (Exit)  82 bpm  77 bpm  98 bpm  93 bpm  -   Rating of Perceived Exertion (Exercise)  _0 -   Symptoms  none  none  none  none  -   Duration  Continue with 30 min of aerobic exercise without signs/symptoms of physical distress.  Continue  with 30 min of aerobic exercise without signs/symptoms of physical distress.  Continue with 30 min of aerobic exercise without signs/symptoms of physical distress.  Continue with 30 min of aerobic exercise without signs/symptoms of physical distress.  -   Intensity  THRR unchanged  THRR unchanged  THRR unchanged  THRR unchanged  -     Progression   Progression  Continue to progress workloads to maintain intensity without signs/symptoms of physical distress.  Continue to progress workloads to maintain intensity without signs/symptoms of physical distress.  Continue to progress workloads to maintain intensity without signs/symptoms of physical distress.  Continue to progress workloads to maintain intensity without signs/symptoms of physical distress.  -   Average METs  2.25  2.25  2.41  2.4  -     Resistance Training   Weight  3 lb  3 lb  3 lb  5 lb  -   Reps  10-15  10-15  10-15  10-15  -     Interval Training   Interval Training  No  No  No  No  -     Recumbant Bike   Level  3  -  2  -  -   Watts  15  -  16  -  -   Minutes  15  -  15  -  -   METs  2.5  -  2.16  -  -     NuStep   Level  _0 -   SPM  -  80  -  80  -   Minutes  _1 -   METs  2.3  2.5  2.7  2.7  -     Arm Ergometer   Level  1  -  1.2  -  -   Minutes  15  -  15  -  -   METs  2.2  -  2.1  -  -     Biostep-RELP   Level  _2 -   SPM  -  50  -  50  -   Minutes  _3 -   METs  _4 -     Home Exercise Plan   Plans to continue exercise at  Home (comment) walk and stepper  Home (comment) walk and stepper  Home (comment) walk and stepper  -  Home (comment) walk and stepper   Frequency  Add 2 additional days to program exercise sessions.  Add 2 additional days to program exercise sessions.  Add 2 additional days to program exercise sessions.  -  Add 2 additional days to program exercise sessions.   Initial Home Exercises Provided  12/22/19  12/22/19  12/22/19  -  12/22/19      Exercise Comments: Exercise Comments    Row Name 12/01/19 1551           Exercise Comments  First full day of exercise!  Patient was oriented to gym and equipment including functions, settings, policies, and procedures.  Patient's individual exercise prescription and treatment plan were reviewed.  All starting workloads were established based on the results of the 6 minute walk test done at initial orientation visit.  The plan for exercise progression was also introduced and progression will be customized based on patient's performance  and goals.          Exercise Goals and Review: Exercise Goals    Row Name 11/28/19 1619             Exercise Goals   Increase Physical Activity  Yes       Intervention  Provide advice, education, support and counseling about physical activity/exercise needs.;Develop an individualized exercise prescription for aerobic and resistive training based on initial evaluation findings, risk stratification, comorbidities and participant's personal goals.       Expected Outcomes  Short Term: Attend rehab on a regular basis to increase amount of physical activity.;Long Term: Add in home exercise to make exercise part of routine and to increase amount of physical activity.;Long Term: Exercising regularly at least 3-5 days a week.       Increase Strength and Stamina  Yes       Intervention   Provide advice, education, support and counseling about physical activity/exercise needs.;Develop an individualized exercise prescription for aerobic and resistive training based on initial evaluation findings, risk stratification, comorbidities and participant's personal goals.       Expected Outcomes  Short Term: Increase workloads from initial exercise prescription for resistance, speed, and METs.;Short Term: Perform resistance training exercises routinely during rehab and add in resistance training at home;Long Term: Improve cardiorespiratory fitness, muscular endurance and strength as measured by increased METs and functional capacity (6MWT)       Able to understand and use rate of perceived exertion (RPE) scale  Yes       Intervention  Provide education and explanation on how to use RPE scale       Expected Outcomes  Short Term: Able to use RPE daily in rehab to express subjective intensity level;Long Term:  Able to use RPE to guide intensity level when exercising independently       Able to understand and use Dyspnea scale  Yes       Intervention  Provide education and explanation on how to use Dyspnea scale       Expected Outcomes  Short Term: Able to use Dyspnea scale daily in rehab to express subjective sense of shortness of breath during exertion;Long Term: Able to use Dyspnea scale to guide intensity level when exercising independently       Knowledge and understanding of Target Heart Rate Range (THRR)  Yes       Intervention  Provide education and explanation of THRR including how the numbers were predicted and where they are located for reference       Expected Outcomes  Short Term: Able to state/look up THRR;Short Term: Able to use daily as guideline for intensity in rehab;Long Term: Able to use THRR to govern intensity when exercising independently       Able to check pulse independently  Yes       Intervention  Provide education and demonstration on how to check pulse in carotid and  radial arteries.;Review the importance of being able to check your own pulse for safety during independent exercise       Expected Outcomes  Short Term: Able to explain why pulse checking is important during independent exercise;Long Term: Able to check pulse independently and accurately       Understanding of Exercise Prescription  Yes       Intervention  Provide education, explanation, and written materials on patient's individual exercise prescription       Expected Outcomes  Short Term: Able to explain program exercise prescription;Long Term:  Able to explain home exercise prescription to exercise independently       Improve claudication pain toleration; Improve walking ability  Yes       Intervention  Participate in PAD/SET Rehab 2-3 days a week, walking at home as part of exercise prescription;Attend education sessions to aid in risk factor modification and understanding of disease process       Expected Outcomes  Short Term: Improve walking distance/time to onset of claudication pain;Long Term: Improve walking ability and toleration to claudication;Long Term: Improve score of PAD questionnaires          Exercise Goals Re-Evaluation : Exercise Goals Re-Evaluation    Row Name 12/01/19 1551 12/07/19 0920 12/21/19 1420 12/22/19 1521 12/26/19 1534     Exercise Goal Re-Evaluation   Exercise Goals Review  Able to understand and use rate of perceived exertion (RPE) scale;Knowledge and understanding of Target Heart Rate Range (THRR);Able to check pulse independently;Understanding of Exercise Prescription  -  Increase Physical Activity;Increase Strength and Stamina;Understanding of Exercise Prescription  Increase Physical Activity;Increase Strength and Stamina;Understanding of Exercise Prescription;Able to understand and use rate of perceived exertion (RPE) scale;Able to understand and use Dyspnea scale;Knowledge and understanding of Target Heart Rate Range (THRR);Able to check pulse independently   Increase Physical Activity;Increase Strength and Stamina;Able to understand and use rate of perceived exertion (RPE) scale;Knowledge and understanding of Target Heart Rate Range (THRR)   Comments  Reviewed RPE scale, THR and program prescription with pt today.  Pt voiced understanding and was given a copy of goals to take home.  Brandon Lewis has moved up to level 3 on NS and Biostep.  He is working at correct THR range.  Brandon Lewis is doing well in rehab. He is now up to 0.8 mph on the treadmill and most days is getting about 10 min.  We will continue to montior his progress.  Reviewed home exercise with pt today.  Pt plans to walk and use stepper at home for exercise.  Reviewed THR, pulse, RPE, sign and symptoms, NTG use, and when to call 911 or MD.  Also discussed weather considerations and indoor options.  Pt voiced understanding.  Brandon Lewis asked about doing more strength work.  We discussed getting 5 lb weights as he has 3 lb and doing 2-3 sets of exercises in HE packet or using videos.   Expected Outcomes  Short: Use RPE daily to regulate intensity. Long: Follow program prescription in THR.  Short - attend HT consistently Long: improve MET level  Short: Continue to improve stamina on treadmill.  Long; Continue to improve stamina  SHort: Start to add in exercise at home.  Long: Continue to improve stamina.  Short: try videos Long:  continue to improve stamina   Row Name 01/05/20 0914 01/17/20 1603 02/01/20 1511 02/14/20 1617 02/15/20 1511     Exercise Goal Re-Evaluation   Exercise Goals Review  Increase Physical Activity;Increase Strength and Stamina;Able to understand and use rate of perceived exertion (RPE) scale;Able to understand and use Dyspnea scale;Understanding of Exercise Prescription  Increase Physical Activity;Increase Strength and Stamina;Understanding of Exercise Prescription  -  Increase Physical Activity;Increase Strength and Stamina;Understanding of Exercise Prescription  Increase Physical  Activity;Increase Strength and Stamina;Understanding of Exercise Prescription   Comments  Brandon Lewis tolerates exercise well.  He works at correct RPE ranges.  Staff will monitor progress.  Brandon Lewis has been doing well in rehab.  He is now up to level 3 on the BioStep.  We will continue to monitor his progress.  Brandon Lewis has been doing well in rehab.  He is now up to level 3 on the BioStep.  We will continue to monitor his progress. Pt has a Nustep 1x/week 20 minutes at 5 (RPE 14-15). Discussed resistence training, just ordered 5 lb weights. Discussed how that could be a good goal for next month.  Brandon Lewis continues to do well in rehab.  He is on level 4 for NuStep at 2.7 METs.  We will continue to monitor his progress.  Brandon Lewis continues to do well in rehab.  He is on level 5 for Nustep at home for about 2 sessions of 20 minutes 2x/week, does weights (3lbs) for 10 minutes 2x/week. We will continue to monitor his progress.   Expected Outcomes  Short:  exercise consistently between program sessions and home Long:  improve overall MET level  Short: Increase arm crank  Long: Continue to improve stamina.  Short: Increase arm crank, increase Nustep to 2x/week  Long: Continue to improve stamina.  Short: Increase hand weights  Long: Continue to improve stamina  Short: Increase hand weights  Long: Continue to improve stamina   Row Name 02/28/20 1444             Exercise Goal Re-Evaluation   Exercise Goals Review  Increase Physical Activity;Increase Strength and Stamina;Understanding of Exercise Prescription       Comments  Brandon  has progressed well in HT.  He will have his discharge 6 min walk soon.  He has increased to 5 lb for weights.       Expected Outcomes  Short: complete HT program Long:  maintain exercise on his own          Discharge Exercise Prescription (Final Exercise Prescription Changes): Exercise Prescription Changes - 02/28/20 1400      Home Exercise Plan   Plans to continue exercise at  Home  (comment)   walk and stepper   Frequency  Add 2 additional days to program exercise sessions.    Initial Home Exercises Provided  12/22/19       Nutrition:  Target Goals: Understanding of nutrition guidelines, daily intake of sodium <1549m, cholesterol <2032m calories 30% from fat and 7% or less from saturated fats, daily to have 5 or more servings of fruits and vegetables.  Education: Controlling Sodium/Reading Food Labels -Group verbal and written material supporting the discussion of sodium use in heart healthy nutrition. Review and explanation with models, verbal and written materials for utilization of the food label.   Education: General Nutrition Guidelines/Fats and Fiber: -Group instruction provided by verbal, written material, models and posters to present the general guidelines for heart healthy nutrition. Gives an explanation and review of dietary fats and fiber.   Biometrics: Pre Biometrics - 11/28/19 1620      Pre Biometrics   Height  5' 9.5" (1.765 m)    Weight  195 lb 11.2 oz (88.8 kg)    BMI (Calculated)  28.5    Single Leg Stand  8.82 seconds      Post Biometrics - 02/29/20 1543       Post  Biometrics   Height  5' 9.5" (1.765 m)    Weight  199 lb 1.6 oz (90.3 kg)    BMI (Calculated)  28.99       Nutrition Therapy Plan and Nutrition Goals: Nutrition Therapy & Goals - 12/28/19 1301      Nutrition Therapy   Diet  HH, Low Na    Drug/Food Interactions  Statins/Certain Fruits  Protein (specify units)  70-75g    Fiber  30 grams    Whole Grain Foods  3 servings    Saturated Fats  12 max. grams    Fruits and Vegetables  5 servings/day    Sodium  1.5 grams      Personal Nutrition Goals   Nutrition Goal  ST: Lower Na in diet LT: Increase knowledge    Comments  Pt reports liking vegetables. "too much of the wrong kind". Pt reports eating meat and trying to include more fish. Discussed HH and CKD friendly eating. Pt has been doing research and is motivated.       Intervention Plan   Intervention  Prescribe, educate and counsel regarding individualized specific dietary modifications aiming towards targeted core components such as weight, hypertension, lipid management, diabetes, heart failure and other comorbidities.;Nutrition handout(s) given to patient.    Expected Outcomes  Short Term Goal: Understand basic principles of dietary content, such as calories, fat, sodium, cholesterol and nutrients.;Short Term Goal: A plan has been developed with personal nutrition goals set during dietitian appointment.;Long Term Goal: Adherence to prescribed nutrition plan.       Nutrition Assessments:   MEDIFICTS Score Key:          ?70 Need to make dietary changes          40-70 Heart Healthy Diet         ? 40 Therapeutic Level Cholesterol Diet  Nutrition Goals Re-Evaluation: Nutrition Goals Re-Evaluation    [place] Name 02/01/20 1515 02/15/20 1515           Goals   Nutrition Goal  ST: mindful eating LT: Increase knowledge  ST: try new dessert recipe LT: Increase knowledge      Comment  Pt reports watching what he is eating, now not adding extra salt to food. Pt reports eating less processed foods. Pt wants to lose weight, talked about mindful eating.  Pt reports trying to eat well and is now eating more salads and less "junk food", still loves desserts. Gave pt dessert recipe handout.      Expected Outcome  ST: mindful eating LT: Increase knowledge  ST: try new dessert recipe LT: Increase knowledge         Nutrition Goals Discharge (Final Nutrition Goals Re-Evaluation): Nutrition Goals Re-Evaluation - 02/15/20 1515      Goals   Nutrition Goal  ST: try new dessert recipe LT: Increase knowledge    Comment  Pt reports trying to eat well and is now eating more salads and less "junk food", still loves desserts. Gave pt dessert recipe handout.    Expected Outcome  ST: try new dessert recipe LT: Increase knowledge       Psychosocial: Target Goals:  Acknowledge presence or absence of significant depression and/or stress, maximize coping skills, provide positive support system. Participant is able to verbalize types and ability to use techniques and skills needed for reducing stress and depression.   Education: Depression - Provides group verbal and written instruction on the correlation between heart/lung disease and depressed mood, treatment options, and the stigmas associated with seeking treatment.   Education: Sleep Hygiene -Provides group verbal and written instruction about how sleep can affect your health.  Define sleep hygiene, discuss sleep cycles and impact of sleep habits. Review good sleep hygiene tips.     Education: Stress and Anxiety: - Provides group verbal and written instruction about the health risks of elevated stress and causes of high stress.  Discuss  the correlation between heart/lung disease and anxiety and treatment options. Review healthy ways to manage with stress and anxiety.    Initial Review & Psychosocial Screening: Initial Psych Review & Screening - 11/21/19 1415      Initial Review   Current issues with  Current Sleep Concerns;Current Stress Concerns    Source of Stress Concerns  Chronic Illness;Family    Comments  having to get up to use bathroom      Family Dynamics   Good Support System?  Yes      Barriers   Psychosocial barriers to participate in program  There are no identifiable barriers or psychosocial needs.;The patient should benefit from training in stress management and relaxation.      Screening Interventions   Interventions  Encouraged to exercise;To provide support and resources with identified psychosocial needs;Provide feedback about the scores to participant    Expected Outcomes  Short Term goal: Utilizing psychosocial counselor, staff and physician to assist with identification of specific Stressors or current issues interfering with healing process. Setting desired goal for each  stressor or current issue identified.;Long Term Goal: Stressors or current issues are controlled or eliminated.;Short Term goal: Identification and review with participant of any Quality of Life or Depression concerns found by scoring the questionnaire.;Long Term goal: The participant improves quality of Life and PHQ9 Scores as seen by post scores and/or verbalization of changes       Quality of Life Scores:   Scores of 19 and below usually indicate a poorer quality of life in these areas.  A difference of  2-3 points is a clinically meaningful difference.  A difference of 2-3 points in the total score of the Quality of Life Index has been associated with significant improvement in overall quality of life, self-image, physical symptoms, and general health in studies assessing change in quality of life.  PHQ-9: Recent Review Flowsheet Data    Depression screen [facility] 2/9 11/28/2019 09/13/2018 08/24/2017 08/21/2016 08/14/2015   Decreased Interest 0 0 0 0 0   Down, Depressed, Hopeless 0 0 0 0 0   PHQ - 2 Score 0 0 0 0 0   Altered sleeping 0 - - - -   Tired, decreased energy 3 - - - -   Change in appetite 0 - - - -   Feeling bad or failure about yourself  0 - - - -   Trouble concentrating 0 - - - -   Moving slowly or fidgety/restless 0 - - - -   Suicidal thoughts 0 - - - -   PHQ-9 Score 3 - - - -   Difficult doing work/chores Somewhat difficult - - - -     Interpretation of Total Score  Total Score Depression Severity:  1-4 = Minimal depression, 5-9 = Mild depression, 10-14 = Moderate depression, 15-19 = Moderately severe depression, 20-27 = Severe depression   Psychosocial Evaluation and Intervention: Psychosocial Evaluation - 11/21/19 1422      Psychosocial Evaluation & Interventions   Comments  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 He has a good support system. He helps care for his wife who had a stroke recently, so that keeps him busy. Taking time for himself may be difficult, but he  wants to take care of himself for her. He does have a $20 copay but he is going to try to figure something out    Expected Outcomes  Short: attend cardiac rehab as much as possible. Long: develop  positive self care habits.    Continue Psychosocial Services   Follow up required by staff       Psychosocial Re-Evaluation: Psychosocial Re-Evaluation    [place] Name 12/26/19 1530 02/01/20 1514 02/15/20 1514         Psychosocial Re-Evaluation   Current issues with  Current Sleep Concerns  Current Sleep Concerns  Current Sleep Concerns     Comments  Brandon Lewis states he sleeps well - he gets up to go the bathroom but doesnt have trouble going back to sleep.  He states he doesnt have any stress at this time.  Brandon Lewis states he sleeps well - he gets up to go the bathroom but doesnt have trouble going back to sleep.  He states he doesnt have any stress at this time.  Brandon Lewis states he sleeps well - he gets up to go the bathroom but doesnt have trouble going back to sleep.  He states he doesnt have any stress at this time.     Expected Outcomes  Short : maintain good sleep and stress management Long:  maintain good coping skills  Short : maintain good sleep and stress management Long:  maintain good coping skills  Short : maintain good sleep and stress management Long:  maintain good coping skills     Interventions  Encouraged to attend Cardiac Rehabilitation for the exercise  Encouraged to attend Cardiac Rehabilitation for the exercise  Encouraged to attend Cardiac Rehabilitation for the exercise     Continue Psychosocial Services   -  Follow up required by staff  Follow up required by staff        Psychosocial Discharge (Final Psychosocial Re-Evaluation): Psychosocial Re-Evaluation - 02/15/20 1514      Psychosocial Re-Evaluation   Current issues with  Current Sleep Concerns    Comments  Brandon Lewis states he sleeps well - he gets up to go the bathroom but doesnt have trouble going back to sleep.  He states he  doesnt have any stress at this time.    Expected Outcomes  Short : maintain good sleep and stress management Long:  maintain good coping skills    Interventions  Encouraged to attend Cardiac Rehabilitation for the exercise    Continue Psychosocial Services   Follow up required by staff       Vocational Rehabilitation: Provide vocational rehab assistance to qualifying candidates.   Vocational Rehab Evaluation & Intervention: Vocational Rehab - 11/21/19 1422      Initial Vocational Rehab Evaluation & Intervention   Assessment shows need for Vocational Rehabilitation  No       Education: Education Goals: Education classes will be provided on a variety of topics geared toward better understanding of heart health and risk factor modification. Participant will state understanding/return demonstration of topics presented as noted by education test scores.  Learning Barriers/Preferences: Learning Barriers/Preferences - 11/21/19 1422      Learning Barriers/Preferences   Learning Barriers  Hearing    Learning Preferences  None       General Cardiac Education Topics:  AED/CPR: - Group verbal and written instruction with the use of models to demonstrate the basic use of the AED with the basic ABC's of resuscitation.   Anatomy & Physiology of the Heart: - Group verbal and written instruction and models provide basic cardiac anatomy and physiology, with the coronary electrical and arterial systems. Review of Valvular disease and Heart Failure   Cardiac Procedures: - Group verbal and written instruction to review commonly prescribed medications for  heart disease. Reviews the medication, class of the drug, and side effects. Includes the steps to properly store meds and maintain the prescription regimen. (beta blockers and nitrates)   Cardiac Medications I: - Group verbal and written instruction to review commonly prescribed medications for heart disease. Reviews the medication, class of  the drug, and side effects. Includes the steps to properly store meds and maintain the prescription regimen.   Cardiac Medications II: -Group verbal and written instruction to review commonly prescribed medications for heart disease. Reviews the medication, class of the drug, and side effects. (all other drug classes)    Go Sex-Intimacy & Heart Disease, Get SMART - Goal Setting: - Group verbal and written instruction through game format to discuss heart disease and the return to sexual intimacy. Provides group verbal and written material to discuss and apply goal setting through the application of the S.M.A.R.T. Method.   Other Matters of the Heart: - Provides group verbal, written materials and models to describe Stable Angina and Peripheral Artery. Includes description of the disease process and treatment options available to the cardiac patient.   Infection Prevention: - Provides verbal and written material to individual with discussion of infection control including proper hand washing and proper equipment cleaning during exercise session.   Cardiac Rehab from 11/28/2019 in [facility]  Date  11/28/19  Educator  AS  Instruction Review Code  1- Verbalizes Understanding      Falls Prevention: - Provides verbal and written material to individual with discussion of falls prevention and safety.   Cardiac Rehab from 11/28/2019 in [facility]iac [facility] Re[facility]  Date  11/28/19  Educator  AS  Instruction Review Code  1- Verbalizes Understanding      Other: -Provides group and verbal instruction on various topics (see comments)   Knowledge Questionnaire Score:   Core Components/Risk Factors/Patient Goals at Admission: Personal Goals and Risk Factors at Admission - 11/28/19 1625      Core Components/Risk Factors/Patient Goals on Admission    Weight Management  Yes    Intervention  Weight Management: Develop a combined nutrition and exercise program designed  to reach desired caloric intake, while maintaining appropriate intake of nutrient and fiber, sodium and fats, and appropriate energy expenditure required for the weight goal.;Weight Management: Provide education and appropriate resources to help participant work on and attain dietary goals.    Admit Weight  195 lb 11.2 oz (88.8 kg)       Education:Diabetes - Individual verbal and written instruction to review signs/symptoms of diabetes, desired ranges of glucose level fasting, after meals and with exercise. Acknowledge that pre and post exercise glucose checks will be done for 3 sessions at entry of program.   Education: Know Your Numbers and Risk Factors: -Group verbal and written instruction about important numbers in your health.  Discussion of what are risk factors and how they play a role in the disease process.  Review of Cholesterol, Blood Pressure, Diabetes, and BMI and the role they play in your overall health.   Core Components/Risk Factors/Patient Goals Review:  Goals and Risk Factor Review    Row Name 12/26/19 1529 02/01/20 1519 02/15/20 1516         Core Components/Risk Factors/Patient Goals Review   Personal Goals Review  Weight Management/Obesity;Hypertension;Lipids;Other  Weight Management/Obesity;Hypertension;Lipids;Other  Weight Management/Obesity;Hypertension;Lipids;Other     Review  Brandon [place] is taking meds as directed.  He has a BP monitor but hasnt been checking it at home.  Brandon [place]  is taking meds as directed.  He has a BP monitor but hasnt been checking it at home.  Brandon [place] is taking meds as directed.  He has a BP monitor but hasnt been checking it at home.     Expected Outcomes  Short - monitor BP once a day at home Long: maintain BP at levels recommended by Dr  Sheran Fava - monitor BP once a day at home (at least on the days not here) Long: maintain BP at levels recommended by Dr  Sheran Fava - monitor BP once a day at home (at least on the days not here) Long: maintain BP at levels  recommended by Dr        Core Components/Risk Factors/Patient Goals at Discharge (Final Review):  Goals and Risk Factor Review - 02/15/20 1516      Core Components/Risk Factors/Patient Goals Review   Personal Goals Review  Weight Management/Obesity;Hypertension;Lipids;Other    Review  Brandon [place] is taking meds as directed.  He has a BP monitor but hasnt been checking it at home.    Expected Outcomes  Short - monitor BP once a day at home (at least on the days not here) Long: maintain BP at levels recommended by Dr       Evelena Peat Comments: ITP Comments    Row Name 11/21/19 1420 12/01/19 1551 12/14/19 0635 12/28/19 1304 01/11/20 0636   ITP Comments  Initial orientation compelted. Diagnosis can be found in [facility] 11/22. EP orientation scheduled fro 2/8 at 3pm  First full day of exercise!  Patient was oriented to gym and equipment including functions, settings, policies, and procedures.  Patient's individual exercise prescription and treatment plan were reviewed.  All starting workloads were established based on the results of the 6 minute walk test done at initial orientation visit.  The plan for exercise progression was also introduced and progression will be customized based on patient's performance and goals.  30 day chart review completed. ITP sent to Dr Zachery Dakins Medical Director, for review,changes as needed and signature. New to program  Completed Initial RD Eval  30 day chart review completed. ITP sent to Dr Zachery Dakins Medical Director, for review,changes as needed and signature. Continue with ITP if no changes requested   [place] Name 02/08/20 0557 03/07/20 0617         ITP Comments  30 Day review completed. Medical Director review done, changes made as directed,and approval shown by signature of Market researcher.  30 Day review completed. ITP review done, changes made as directed,and approval shown by signature of  Scientist, research (life sciences).         Comments:

## 2020-03-08 ENCOUNTER — Telehealth: Payer: Self-pay | Admitting: *Deleted

## 2020-03-08 ENCOUNTER — Encounter: Payer: Self-pay | Admitting: *Deleted

## 2020-03-08 DIAGNOSIS — I214 Non-ST elevation (NSTEMI) myocardial infarction: Secondary | ICD-10-CM

## 2020-03-08 NOTE — Progress Notes (Signed)
Discharge Progress Report  Patient Details  Name: Brandon Lewis MRN: [id number] Date of Birth: [birth year] Referring Provider:     Cardiac Rehab from 11/28/2019 in [facility]  Referring Provider  [name]       Number of Visits: 98  Reason for Discharge:  Patient reached a stable level of exercise. Patient independent in their exercise. Patient has met program and personal goals.  Smoking History:  Social History   Tobacco Use  Smoking Status Former Smoker  . Types: Cigarettes  . Quit date: 10/20/1992  . Years since quitting: 27.4  Smokeless Tobacco Never Used  Tobacco Comment   quit in 1994     Diagnosis:  NSTEMI (non-ST elevated myocardial infarction) ([place])  ADL [facility]:   Initial Exercise Prescription: Initial Exercise Prescription - 11/28/19 1600      Date of Initial Exercise RX and Referring Provider   Date  11/28/19    Referring Provider  [name]      Treadmill   MPH  1    Grade  0    Minutes  15    METs  1.77      NuStep   Level  1    SPM  80    Minutes  15    METs  1      Recumbant Elliptical   Level  1    RPM  50    Minutes  15    METs  1      REL-XR   Level  1    Speed  50    Minutes  15    METs  1      Prescription Details   Frequency (times per week)  3    Duration  Progress to 30 minutes of continuous aerobic without signs/symptoms of physical distress      Intensity   THRR 40-80% of Max Heartrate  102-125    Ratings of Perceived Exertion  11-13    Perceived Dyspnea  0-4       Discharge Exercise Prescription (Final Exercise Prescription Changes): Exercise Prescription Changes - 02/28/20 1400      Home Exercise Plan   Plans to continue exercise at  Home (comment)   walk and stepper   Frequency  Add 2 additional days to program exercise sessions.    Initial Home Exercises Provided  12/22/19       Functional Capacity: 6 Minute Walk    Row Name 11/28/19 1609 02/29/20 1542       6 Minute Walk    Phase  Discharge  Discharge    Distance  610 feet  760 feet    Distance % Change  --  24.5 %    Distance Feet Change  --  150 ft    Walk Time  6 minutes  6 minutes    # of Rest Breaks  0  0    MPH  1.15  1.44    METS  1  1.51    RPE  15  17    Perceived Dyspnea   3  --    VO2 Peak  3.53  5.3    Symptoms  Yes (comment)  Yes (comment)    Comments  claudication leg pain 6/10 ( normal for him walking)  claudication leg pain 9/10    Resting HR  79 bpm  81 bpm    Resting BP  122/56  138/64    Resting Oxygen Saturation  97 %  --    Exercise Oxygen Saturation  during 6 min walk  99 %  --    Max Ex. HR  102 bpm  110 bpm    Max Ex. BP  134/54  158/74    2 Minute Post BP  130/56  --       Psychological, QOL, Others - Outcomes: PHQ 2/9: Depression screen [facility] 2/9 11/28/2019 09/13/2018 08/24/2017 08/21/2016 08/14/2015  Decreased Interest 0 0 0 0 0  Down, Depressed, Hopeless 0 0 0 0 0  PHQ - 2 Score 0 0 0 0 0  Altered sleeping 0 - - - -  Tired, decreased energy 3 - - - -  Change in appetite 0 - - - -  Feeling bad or failure about yourself  0 - - - -  Trouble concentrating 0 - - - -  Moving slowly or fidgety/restless 0 - - - -  Suicidal thoughts 0 - - - -  PHQ-9 Score 3 - - - -  Difficult doing work/chores Somewhat difficult - - - -    Quality of Life:   Personal Goals: Goals established at orientation with interventions provided to work toward goal. Personal Goals and Risk Factors at Admission - 11/28/19 1625      Core Components/Risk Factors/Patient Goals on Admission    Weight Management  Yes    Intervention  Weight Management: Develop a combined nutrition and exercise program designed to reach desired caloric intake, while maintaining appropriate intake of nutrient and fiber, sodium and fats, and appropriate energy expenditure required for the weight goal.;Weight Management: Provide education and appropriate resources to help participant work on and attain dietary goals.    Admit  Weight  195 lb 11.2 oz (88.8 kg)        Personal Goals Discharge: Goals and Risk Factor Review    Row Name 12/26/19 1529 02/01/20 1519 02/15/20 1516         Core Components/Risk Factors/Patient Goals Review   Personal Goals Review  Weight Management/Obesity;Hypertension;Lipids;Other  Weight Management/Obesity;Hypertension;Lipids;Other  Weight Management/Obesity;Hypertension;Lipids;Other     Review  Brandon Lewis is taking meds as directed.  He has a BP monitor but hasnt been checking it at home.  Brandon Lewis is taking meds as directed.  He has a BP monitor but hasnt been checking it at home.  Brandon Lewis is taking meds as directed.  He has a BP monitor but hasnt been checking it at home.     Expected Outcomes  Short - monitor BP once a day at home Long: maintain BP at levels recommended by Dr  Sheran Fava - monitor BP once a day at home (at least on the days not here) Long: maintain BP at levels recommended by Dr  Sheran Fava - monitor BP once a day at home (at least on the days not here) Long: maintain BP at levels recommended by Dr        Exercise Goals and Review: Exercise Goals    Row Name 11/28/19 1619             Exercise Goals   Increase Physical Activity  Yes       Intervention  Provide advice, education, support and counseling about physical activity/exercise needs.;Develop an individualized exercise prescription for aerobic and resistive training based on initial evaluation findings, risk stratification, comorbidities and participant's personal goals.       Expected Outcomes  Short Term: Attend rehab on a regular basis to increase amount of physical activity.;Long Term: Add in  home exercise to make exercise part of routine and to increase amount of physical activity.;Long Term: Exercising regularly at least 3-5 days a week.       Increase Strength and Stamina  Yes       Intervention  Provide advice, education, support and counseling about physical activity/exercise needs.;Develop an individualized  exercise prescription for aerobic and resistive training based on initial evaluation findings, risk stratification, comorbidities and participant's personal goals.       Expected Outcomes  Short Term: Increase workloads from initial exercise prescription for resistance, speed, and METs.;Short Term: Perform resistance training exercises routinely during rehab and add in resistance training at home;Long Term: Improve cardiorespiratory fitness, muscular endurance and strength as measured by increased METs and functional capacity (6MWT)       Able to understand and use rate of perceived exertion (RPE) scale  Yes       Intervention  Provide education and explanation on how to use RPE scale       Expected Outcomes  Short Term: Able to use RPE daily in rehab to express subjective intensity level;Long Term:  Able to use RPE to guide intensity level when exercising independently       Able to understand and use Dyspnea scale  Yes       Intervention  Provide education and explanation on how to use Dyspnea scale       Expected Outcomes  Short Term: Able to use Dyspnea scale daily in rehab to express subjective sense of shortness of breath during exertion;Long Term: Able to use Dyspnea scale to guide intensity level when exercising independently       Knowledge and understanding of Target Heart Rate Range (THRR)  Yes       Intervention  Provide education and explanation of THRR including how the numbers were predicted and where they are located for reference       Expected Outcomes  Short Term: Able to state/look up THRR;Short Term: Able to use daily as guideline for intensity in rehab;Long Term: Able to use THRR to govern intensity when exercising independently       Able to check pulse independently  Yes       Intervention  Provide education and demonstration on how to check pulse in carotid and radial arteries.;Review the importance of being able to check your own pulse for safety during independent exercise        Expected Outcomes  Short Term: Able to explain why pulse checking is important during independent exercise;Long Term: Able to check pulse independently and accurately       Understanding of Exercise Prescription  Yes       Intervention  Provide education, explanation, and written materials on patient's individual exercise prescription       Expected Outcomes  Short Term: Able to explain program exercise prescription;Long Term: Able to explain home exercise prescription to exercise independently       Improve claudication pain toleration; Improve walking ability  Yes       Intervention  Participate in PAD/SET Rehab 2-3 days a week, walking at home as part of exercise prescription;Attend education sessions to aid in risk factor modification and understanding of disease process       Expected Outcomes  Short Term: Improve walking distance/time to onset of claudication pain;Long Term: Improve walking ability and toleration to claudication;Long Term: Improve score of PAD questionnaires          Exercise Goals Re-Evaluation: Exercise Goals Re-Evaluation  [place] Name 12/01/19 1551 12/07/19 0920 12/21/19 1420 12/22/19 1521 12/26/19 1534     Exercise Goal Re-Evaluation   Exercise Goals Review  Able to understand and use rate of perceived exertion (RPE) scale;Knowledge and understanding of Target Heart Rate Range (THRR);Able to check pulse independently;Understanding of Exercise Prescription  --  Increase Physical Activity;Increase Strength and Stamina;Understanding of Exercise Prescription  Increase Physical Activity;Increase Strength and Stamina;Understanding of Exercise Prescription;Able to understand and use rate of perceived exertion (RPE) scale;Able to understand and use Dyspnea scale;Knowledge and understanding of Target Heart Rate Range (THRR);Able to check pulse independently  Increase Physical Activity;Increase Strength and Stamina;Able to understand and use rate of perceived exertion (RPE)  scale;Knowledge and understanding of Target Heart Rate Range (THRR)   Comments  Reviewed RPE scale, THR and program prescription with pt today.  Pt voiced understanding and was given a copy of goals to take home.  Brandon Lewis has moved up to level 3 on NS and Biostep.  He is working at correct THR range.  Brandon Lewis is doing well in rehab. He is now up to 0.8 mph on the treadmill and most days is getting about 10 min.  We will continue to montior his progress.  Reviewed home exercise with pt today.  Pt plans to walk and use stepper at home for exercise.  Reviewed THR, pulse, RPE, sign and symptoms, NTG use, and when to call 911 or MD.  Also discussed weather considerations and indoor options.  Pt voiced understanding.  Brandon Lewis asked about doing more strength work.  We discussed getting 5 lb weights as he has 3 lb and doing 2-3 sets of exercises in HE packet or using videos.   Expected Outcomes  Short: Use RPE daily to regulate intensity. Long: Follow program prescription in THR.  Short - attend HT consistently Long: improve MET level  Short: Continue to improve stamina on treadmill.  Long; Continue to improve stamina  SHort: Start to add in exercise at home.  Long: Continue to improve stamina.  Short: try videos Long:  continue to improve stamina   Row Name 01/05/20 0914 01/17/20 1603 02/01/20 1511 02/14/20 1617 02/15/20 1511     Exercise Goal Re-Evaluation   Exercise Goals Review  Increase Physical Activity;Increase Strength and Stamina;Able to understand and use rate of perceived exertion (RPE) scale;Able to understand and use Dyspnea scale;Understanding of Exercise Prescription  Increase Physical Activity;Increase Strength and Stamina;Understanding of Exercise Prescription  --  Increase Physical Activity;Increase Strength and Stamina;Understanding of Exercise Prescription  Increase Physical Activity;Increase Strength and Stamina;Understanding of Exercise Prescription   Comments  Brandon Lewis tolerates exercise well.  He  works at correct RPE ranges.  Staff will monitor progress.  Brandon Lewis has been doing well in rehab.  He is now up to level 3 on the BioStep.  We will continue to monitor his progress.  Brandon Lewis has been doing well in rehab.  He is now up to level 3 on the BioStep.  We will continue to monitor his progress. Pt has a Nustep 1x/week 20 minutes at 5 (RPE 14-15). Discussed resistence training, just ordered 5 lb weights. Discussed how that could be a good goal for next month.  Brandon Lewis continues to do well in rehab.  He is on level 4 for NuStep at 2.7 METs.  We will continue to monitor his progress.  Brandon  continues to do well in rehab.  He is on level 5 for Nustep at home for about 2 sessions of 20 minutes 2x/week, does weights (3lbs)  for 10 minutes 2x/week. We will continue to monitor his progress.   Expected Outcomes  Short:  exercise consistently between program sessions and home Long:  improve overall MET level  Short: Increase arm crank  Long: Continue to improve stamina.  Short: Increase arm crank, increase Nustep to 2x/week  Long: Continue to improve stamina.  Short: Increase hand weights  Long: Continue to improve stamina  Short: Increase hand weights  Long: Continue to improve stamina   Row Name 02/28/20 1444             Exercise Goal Re-Evaluation   Exercise Goals Review  Increase Physical Activity;Increase Strength and Stamina;Understanding of Exercise Prescription       Comments  Brandon Lewis has progressed well in HT.  He will have his discharge 6 min walk soon.  He has increased to 5 lb for weights.       Expected Outcomes  Short: complete HT program Long:  maintain exercise on his own          Nutrition & Weight - Outcomes: Pre Biometrics - 11/28/19 1620      Pre Biometrics   Height  5' 9.5" (1.765 m)    Weight  195 lb 11.2 oz (88.8 kg)    BMI (Calculated)  28.5    Single Leg Stand  8.82 seconds      Post Biometrics - 02/29/20 1543       Post  Biometrics   Height  5' 9.5" (1.765 m)     Weight  199 lb 1.6 oz (90.3 kg)    BMI (Calculated)  28.99       Nutrition: Nutrition Therapy & Goals - 12/28/19 1301      Nutrition Therapy   Diet  HH, Low Na    Drug/Food Interactions  Statins/Certain Fruits    Protein (specify units)  70-75g    Fiber  30 grams    Whole Grain Foods  3 servings    Saturated Fats  12 max. grams    Fruits and Vegetables  5 servings/day    Sodium  1.5 grams      Personal Nutrition Goals   Nutrition Goal  ST: Lower Na in diet LT: Increase knowledge    Comments  Pt reports liking vegetables. "too much of the wrong kind". Pt reports eating meat and trying to include more fish. Discussed HH and CKD friendly eating. Pt has been doing research and is motivated.      Intervention Plan   Intervention  Prescribe, educate and counsel regarding individualized specific dietary modifications aiming towards targeted core components such as weight, hypertension, lipid management, diabetes, heart failure and other comorbidities.;Nutrition handout(s) given to patient.    Expected Outcomes  Short Term Goal: Understand basic principles of dietary content, such as calories, fat, sodium, cholesterol and nutrients.;Short Term Goal: A plan has been developed with personal nutrition goals set during dietitian appointment.;Long Term Goal: Adherence to prescribed nutrition plan.       Nutrition Discharge:   Education Questionnaire Score:   Goals reviewed with patient; copy given to patient.

## 2020-03-08 NOTE — Progress Notes (Signed)
Cardiac Individual Treatment Plan  Patient Details  Name: Brandon Lewis MRN: [id number] Date of Birth: Jul 30, 1934 Referring Provider:     Cardiac Rehab from 11/28/2019 in [facility] Cardiac and Pulmonary Rehab  Referring Provider  Brandon Lewis      Initial Encounter Date:    Cardiac Rehab from 11/28/2019 in [facility] Cardiac and Pulmonary Rehab  Date  11/28/19      Visit Diagnosis: NSTEMI (non-ST elevated myocardial infarction) [facility])  Patient's Home Medications on Admission:  Current Outpatient Medications:  .  amiodarone (PACERONE) 200 MG tablet, Take 1 tablet (200 mg total) by mouth 2 (two) times daily. Take one tablet by mouth TWICE daily for 7 days  then take one tablet by mouth ONCE daily., Disp: 60 tablet, Rfl: 1 .  aspirin 81 MG chewable tablet, Chew 1 tablet (81 mg total) by mouth daily., Disp:  , Rfl:  .  cholecalciferol (VITAMIN D) 1000 UNITS tablet, Take 1,000 Units by mouth at bedtime. , Disp: , Rfl:  .  clopidogrel (PLAVIX) 75 MG tablet, TAKE ONE TABLET BY MOUTH EVERY DAY *BOTTLE*, Disp: 90 tablet, Rfl: 3 .  Magnesium 400 MG CAPS, Take by mouth daily., Disp: , Rfl:  .  metoprolol succinate (TOPROL-XL) 50 MG 24 hr tablet, **VIAL ONLY** TAKE (1) TABLET BY MOUTH ONCE A DAY.DO NOT CRUSH. (HIGHBLOOD PRESSURE) *BOTTLE*, Disp: 30 tablet, Rfl: 11 .  Multiple Vitamin (MULTIVITAMIN WITH MINERALS) TABS tablet, Take 1 tablet by mouth every morning., Disp: , Rfl:  .  rosuvastatin (CRESTOR) 10 MG tablet, **VIAL ONLY** TAKE ONE TABLET BY MOUTH AT BEDTIME. (IMPROVES CHOLESTEROL), Disp: 90 tablet, Rfl: 3 .  sodium chloride (BRONCHO SALINE) inhaler solution, Take 1 spray by nebulization daily as needed (congestion)., Disp: , Rfl:  .  tamsulosin (FLOMAX) 0.4 MG CAPS capsule, TAKE 1 CAPSULE BY MOUTH ONCE DAILY, Disp: 30 capsule, Rfl: 11  Current Facility-Administered Medications:  .  mometasone-formoterol (DULERA) 100-5 MCG/ACT inhaler 2 puff, 2 puff, Inhalation, BID, Atkins, Glenice [place], MD  Past  Medical History: Past Medical History:  Diagnosis Date  . CAD (coronary artery disease)    s/p PCI after presenting with exertional angina (drug eluting stent)  . Diverticulosis   . GERD (gastroesophageal reflux disease)   . HTN (hypertension)   . Hyperlipidemia   . Osteoarthritis   . PVD (peripheral vascular disease) ([place])    s/p aortobifemoral bypass, with bilateral SFA occlusion and intermittent claudication   . Small bowel obstruction (HCC)     Tobacco Use: Social History   Tobacco Use  Smoking Status Former Smoker  . Types: Cigarettes  . Quit date: 10/20/1992  . Years since quitting: 27.4  Smokeless Tobacco Never Used  Tobacco Comment   quit in 1994     Labs: Recent Review Flowsheet Data    Labs for ITP Cardiac and Pulmonary Rehab Latest Ref Rng & Units 09/16/2019 09/16/2019 09/16/2019 09/16/2019 09/16/2019   Cholestrol 0 - 200 mg/dL - - - - -   LDLCALC 0 - 99 mg/dL - - - - -   LDLDIRECT mg/dL - - - - -   HDL >40 mg/dL - - - - -   Trlycerides <150 mg/dL - - - - -   Hemoglobin A1c 4.8 - 5.6 % - - - - -   PHART 7.350 - 7.450 - - 7.313(L) 7.330(L) 7.313(L)   PCO2ART 32.0 - 48.0 mmHg - - 42.7 29.1(L) 31.9(L)   HCO3 20.0 - 28.0 mmol/L - - 21.7 15.4(L) 16.2(L)  TCO2 22 - 32 mmol/L 25 23 23  16(L) 17(L)   ACIDBASEDEF 0.0 - 2.0 mmol/L - - 4.0(H) 10.0(H) 9.0(H)   O2SAT % - - 99.0 91.0 96.0       Exercise Target Goals: Exercise Program Goal: Individual exercise prescription set using results from initial 6 min walk test and THRR while considering  patient's activity barriers and safety.   Exercise Prescription Goal: Initial exercise prescription builds to 30-45 minutes a day of aerobic activity, 2-3 days per week.  Home exercise guidelines will be given to patient during program as part of exercise prescription that the participant will acknowledge.   Education: Aerobic Exercise & Resistance Training: - Gives group verbal and written instruction on the various  components of exercise. Focuses on aerobic and resistive training programs and the benefits of this training and how to safely progress through these programs..   Education: Exercise & Equipment Safety: - Individual verbal instruction and demonstration of equipment use and safety with use of the equipment.   Cardiac Rehab from 11/28/2019 in [facility] Cardiac and Pulmonary Rehab  Date  11/28/19  Educator  AS  Instruction Review Code  1- Verbalizes Understanding      Education: Exercise Physiology & General Exercise Guidelines: - Group verbal and written instruction with models to review the exercise physiology of the cardiovascular system and associated critical values. Provides general exercise guidelines with specific guidelines to those with heart or lung disease.    Education: Flexibility, Balance, Mind/Body Relaxation: Provides group verbal/written instruction on the benefits of flexibility and balance training, including mind/body exercise modes such as yoga, pilates and tai chi.  Demonstration and skill practice provided.   Activity Barriers & Risk Stratification: Activity Barriers & Cardiac Risk Stratification - 11/21/19 1415      Activity Barriers & Cardiac Risk Stratification   Activity Barriers  Other (comment);Back Problems    Comments  claudication pain    Cardiac Risk Stratification  High       6 Minute Walk: 6 Minute Walk    Row Name 11/28/19 1609 02/29/20 1542       6 Minute Walk   Phase  Discharge  Discharge    Distance  610 feet  760 feet    Distance % Change  --  24.5 %    Distance Feet Change  --  150 ft    Walk Time  6 minutes  6 minutes    # of Rest Breaks  0  0    MPH  1.15  1.44    METS  1  1.51    RPE  15  17    Perceived Dyspnea   3  --    VO2 Peak  3.53  5.3    Symptoms  Yes (comment)  Yes (comment)    Comments  claudication leg pain 6/10 ( normal for him walking)  claudication leg pain 9/10    Resting HR  79 bpm  81 bpm    Resting BP  122/56   138/64    Resting Oxygen Saturation   97 %  --    Exercise Oxygen Saturation  during 6 min walk  99 %  --    Max Ex. HR  102 bpm  110 bpm    Max Ex. BP  134/54  158/74    2 Minute Post BP  130/56  --       Oxygen Initial Assessment:   Oxygen Re-Evaluation:   Oxygen Discharge (Final Oxygen Re-Evaluation):  Initial Exercise Prescription: Initial Exercise Prescription - 11/28/19 1600      Date of Initial Exercise RX and Referring Provider   Date  11/28/19    Referring Provider  Brandon Lewis      Treadmill   MPH  1    Grade  0    Minutes  15    METs  1.77      NuStep   Level  1    SPM  80    Minutes  15    METs  1      Recumbant Elliptical   Level  1    RPM  50    Minutes  15    METs  1      REL-XR   Level  1    Speed  50    Minutes  15    METs  1      Prescription Details   Frequency (times per week)  3    Duration  Progress to 30 minutes of continuous aerobic without signs/symptoms of physical distress      Intensity   THRR 40-80% of Max Heartrate  102-125    Ratings of Perceived Exertion  11-13    Perceived Dyspnea  0-4       Perform Capillary Blood Glucose checks as needed.  Exercise Prescription Changes: Exercise Prescription Changes    Row Name 11/28/19 1600 12/07/19 0900 12/21/19 1400 12/22/19 1500 01/05/20 0900     Response to Exercise   Blood Pressure (Admit)  122/56  120/60  128/74  --  110/60   Blood Pressure (Exercise)  134/54  116/62  136/70  --  140/64   Blood Pressure (Exit)  130/56  116/58  132/70  --  116/58   Heart Rate (Admit)  79 bpm  79 bpm  88 bpm  --  86 bpm   Heart Rate (Exercise)  102 bpm  115 bpm  91 bpm  --  99 bpm   Heart Rate (Exit)  86 bpm  89 bpm  83 bpm  --  85 bpm   Oxygen Saturation (Admit)  97 %  --  --  --  --   Oxygen Saturation (Exercise)  99 %  --  --  --  --   Rating of Perceived Exertion (Exercise)  15  15  14   --  15   Perceived Dyspnea (Exercise)  3  --  --  --  --   Symptoms  claudication leg pain  --   fatigue  --  --   Comments  --  second day  --  --  --   Duration  --  Progress to 30 minutes of  aerobic without signs/symptoms of physical distress  Progress to 30 minutes of  aerobic without signs/symptoms of physical distress  --  Continue with 30 min of aerobic exercise without signs/symptoms of physical distress.   Intensity  --  THRR unchanged  THRR unchanged  --  THRR unchanged     Progression   Progression  --  Continue to progress workloads to maintain intensity without signs/symptoms of physical distress.  Continue to progress workloads to maintain intensity without signs/symptoms of physical distress.  --  Continue to progress workloads to maintain intensity without signs/symptoms of physical distress.   Average METs  --  2  1.8  --  2     Resistance Training   Weight  --  3 lb  3 lb  --  3  lb   Reps  --  10-15  10-15  --  10-15     Interval Training   Interval Training  --  No  No  --  No     Treadmill   MPH  --  --  0.8  --  --   Grade  --  --  15  --  --   Minutes  --  --  10  --  --   METs  --  --  1.6  --  --     Recumbant Bike   Level  --  --  --  --  3   RPM  --  --  --  --  60   Minutes  --  --  --  --  15   METs  --  --  --  --  1.8     NuStep   Level  --  3  --  --  --   SPM  --  80  --  --  --   Minutes  --  15  --  --  --   METs  --  2.3  --  --  --     Arm Ergometer   Level  --  --  --  --  1   Minutes  --  --  --  --  15   METs  --  --  --  --  2.1     T5 Nustep   Level  --  --  3  --  --   Minutes  --  --  15  --  --   METs  --  --  1.8  --  --     Biostep-RELP   Level  --  3  --  --  --   SPM  --  50  --  --  --   Minutes  --  15  --  --  --   METs  --  2  --  --  --     Home Exercise Plan   Plans to continue exercise at  --  --  --  Home (comment) walk and stepper  Home (comment) walk and stepper   Frequency  --  --  --  Add 2 additional days to program exercise sessions.  Add 2 additional days to program exercise sessions.   Initial  Home Exercises Provided  --  --  --  12/22/19  12/22/19   Row Name 01/17/20 1600 02/03/20 0800 02/14/20 1600 02/28/20 1200 02/28/20 1400     Response to Exercise   Blood Pressure (Admit)  134/72  128/64  110/58  110/58  --   Blood Pressure (Exercise)  130/64  118/62  142/62  118/64  --   Blood Pressure (Exit)  126/64  112/62  118/64  120/70  --   Heart Rate (Admit)  77 bpm  54 bpm  80 bpm  92 bpm  --   Heart Rate (Exercise)  96 bpm  87 bpm  121 bpm  113 bpm  --   Heart Rate (Exit)  82 bpm  77 bpm  98 bpm  93 bpm  --   Rating of Perceived Exertion (Exercise)  15  15  14  13   --   Symptoms  none  none  none  none  --   Duration  Continue with 30 min of aerobic exercise without signs/symptoms of physical distress.  Continue  with 30 min of aerobic exercise without signs/symptoms of physical distress.  Continue with 30 min of aerobic exercise without signs/symptoms of physical distress.  Continue with 30 min of aerobic exercise without signs/symptoms of physical distress.  --   Intensity  THRR unchanged  THRR unchanged  THRR unchanged  THRR unchanged  --     Progression   Progression  Continue to progress workloads to maintain intensity without signs/symptoms of physical distress.  Continue to progress workloads to maintain intensity without signs/symptoms of physical distress.  Continue to progress workloads to maintain intensity without signs/symptoms of physical distress.  Continue to progress workloads to maintain intensity without signs/symptoms of physical distress.  --   Average METs  2.25  2.25  2.41  2.4  --     Resistance Training   Weight  3 lb  3 lb  3 lb  5 lb  --   Reps  10-15  10-15  10-15  10-15  --     Interval Training   Interval Training  No  No  No  No  --     Recumbant Bike   Level  3  --  2  --  --   [place]  15  --  16  --  --   Minutes  15  --  15  --  --   METs  2.5  --  2.16  --  --     NuStep   Level  3  4  4  5   --   SPM  --  80  --  80  --   Minutes  15  15   15  15   --   METs  2.3  2.5  2.7  2.7  --     Arm Ergometer   Level  1  --  1.2  --  --   Minutes  15  --  15  --  --   METs  2.2  --  2.1  --  --     Biostep-RELP   Level  3  2  3  3   --   SPM  --  50  --  50  --   Minutes  15  15  15  15   --   METs  2  2  2  2   --     Home Exercise Plan   Plans to continue exercise at  Home (comment) walk and stepper  Home (comment) walk and stepper  Home (comment) walk and stepper  --  Home (comment) walk and stepper   Frequency  Add 2 additional days to program exercise sessions.  Add 2 additional days to program exercise sessions.  Add 2 additional days to program exercise sessions.  --  Add 2 additional days to program exercise sessions.   Initial Home Exercises Provided  12/22/19  12/22/19  12/22/19  --  12/22/19      Exercise Comments: Exercise Comments    Row Name 12/01/19 1551           Exercise Comments  First full day of exercise!  Patient was oriented to gym and equipment including functions, settings, policies, and procedures.  Patient's individual exercise prescription and treatment plan were reviewed.  All starting workloads were established based on the results of the 6 minute walk test done at initial orientation visit.  The plan for exercise progression was also introduced and progression will be customized based on patient's performance  and goals.          Exercise Goals and Review: Exercise Goals    Row Name 11/28/19 1619             Exercise Goals   Increase Physical Activity  Yes       Intervention  Provide advice, education, support and counseling about physical activity/exercise needs.;Develop an individualized exercise prescription for aerobic and resistive training based on initial evaluation findings, risk stratification, comorbidities and participant's personal goals.       Expected Outcomes  Short Term: Attend rehab on a regular basis to increase amount of physical activity.;Long Term: Add in home exercise to  make exercise part of routine and to increase amount of physical activity.;Long Term: Exercising regularly at least 3-5 days a week.       Increase Strength and Stamina  Yes       Intervention  Provide advice, education, support and counseling about physical activity/exercise needs.;Develop an individualized exercise prescription for aerobic and resistive training based on initial evaluation findings, risk stratification, comorbidities and participant's personal goals.       Expected Outcomes  Short Term: Increase workloads from initial exercise prescription for resistance, speed, and METs.;Short Term: Perform resistance training exercises routinely during rehab and add in resistance training at home;Long Term: Improve cardiorespiratory fitness, muscular endurance and strength as measured by increased METs and functional capacity (6MWT)       Able to understand and use rate of perceived exertion (RPE) scale  Yes       Intervention  Provide education and explanation on how to use RPE scale       Expected Outcomes  Short Term: Able to use RPE daily in rehab to express subjective intensity level;Long Term:  Able to use RPE to guide intensity level when exercising independently       Able to understand and use Dyspnea scale  Yes       Intervention  Provide education and explanation on how to use Dyspnea scale       Expected Outcomes  Short Term: Able to use Dyspnea scale daily in rehab to express subjective sense of shortness of breath during exertion;Long Term: Able to use Dyspnea scale to guide intensity level when exercising independently       Knowledge and understanding of Target Heart Rate Range (THRR)  Yes       Intervention  Provide education and explanation of THRR including how the numbers were predicted and where they are located for reference       Expected Outcomes  Short Term: Able to state/look up THRR;Short Term: Able to use daily as guideline for intensity in rehab;Long Term: Able to use  THRR to govern intensity when exercising independently       Able to check pulse independently  Yes       Intervention  Provide education and demonstration on how to check pulse in carotid and radial arteries.;Review the importance of being able to check your own pulse for safety during independent exercise       Expected Outcomes  Short Term: Able to explain why pulse checking is important during independent exercise;Long Term: Able to check pulse independently and accurately       Understanding of Exercise Prescription  Yes       Intervention  Provide education, explanation, and written materials on patient's individual exercise prescription       Expected Outcomes  Short Term: Able to explain program exercise prescription;Long Term:  Able to explain home exercise prescription to exercise independently       Improve claudication pain toleration; Improve walking ability  Yes       Intervention  Participate in PAD/SET Rehab 2-3 days a week, walking at home as part of exercise prescription;Attend education sessions to aid in risk factor modification and understanding of disease process       Expected Outcomes  Short Term: Improve walking distance/time to onset of claudication pain;Long Term: Improve walking ability and toleration to claudication;Long Term: Improve score of PAD questionnaires          Exercise Goals Re-Evaluation : Exercise Goals Re-Evaluation    Row Name 12/01/19 1551 12/07/19 0920 12/21/19 1420 12/22/19 1521 12/26/19 1534     Exercise Goal Re-Evaluation   Exercise Goals Review  Able to understand and use rate of perceived exertion (RPE) scale;Knowledge and understanding of Target Heart Rate Range (THRR);Able to check pulse independently;Understanding of Exercise Prescription  --  Increase Physical Activity;Increase Strength and Stamina;Understanding of Exercise Prescription  Increase Physical Activity;Increase Strength and Stamina;Understanding of Exercise Prescription;Able to  understand and use rate of perceived exertion (RPE) scale;Able to understand and use Dyspnea scale;Knowledge and understanding of Target Heart Rate Range (THRR);Able to check pulse independently  Increase Physical Activity;Increase Strength and Stamina;Able to understand and use rate of perceived exertion (RPE) scale;Knowledge and understanding of Target Heart Rate Range (THRR)   Comments  Reviewed RPE scale, THR and program prescription with pt today.  Pt voiced understanding and was given a copy of goals to take home.  Izell [place] has moved up to level 3 on NS and Biostep.  He is working at correct THR range.  Murrell [place] is doing well in rehab. He is now up to 0.8 mph on the treadmill and most days is getting about 10 min.  We will continue to montior his progress.  Reviewed home exercise with pt today.  Pt plans to walk and use stepper at home for exercise.  Reviewed THR, pulse, RPE, sign and symptoms, NTG use, and when to call 911 or MD.  Also discussed weather considerations and indoor options.  Pt voiced understanding.  Izell [place] asked about doing more strength work.  We discussed getting 5 lb weights as he has 3 lb and doing 2-3 sets of exercises in HE packet or using videos.   Expected Outcomes  Short: Use RPE daily to regulate intensity. Long: Follow program prescription in THR.  Short - attend HT consistently Long: improve MET level  Short: Continue to improve stamina on treadmill.  Long; Continue to improve stamina  SHort: Start to add in exercise at home.  Long: Continue to improve stamina.  Short: try videos Long:  continue to improve stamina   Row Name 01/05/20 0914 01/17/20 1603 02/01/20 1511 02/14/20 1617 02/15/20 1511     Exercise Goal Re-Evaluation   Exercise Goals Review  Increase Physical Activity;Increase Strength and Stamina;Able to understand and use rate of perceived exertion (RPE) scale;Able to understand and use Dyspnea scale;Understanding of Exercise Prescription  Increase Physical  Activity;Increase Strength and Stamina;Understanding of Exercise Prescription  --  Increase Physical Activity;Increase Strength and Stamina;Understanding of Exercise Prescription  Increase Physical Activity;Increase Strength and Stamina;Understanding of Exercise Prescription   Comments  Willie tolerates exercise well.  He works at correct RPE ranges.  Staff will monitor progress.  Izell [place] has been doing well in rehab.  He is now up to level 3 on the BioStep.  We will continue to monitor his progress.  Izell [place] has been doing well in rehab.  He is now up to level 3 on the BioStep.  We will continue to monitor his progress. Pt has a Nustep 1x/week 20 minutes at 5 (RPE 14-15). Discussed resistence training, just ordered 5 lb weights. Discussed how that could be a good goal for next month.  Izell [place] continues to do well in rehab.  He is on level 4 for NuStep at 2.7 METs.  We will continue to monitor his progress.  Izell [place] continues to do well in rehab.  He is on level 5 for Nustep at home for about 2 sessions of 20 minutes 2x/week, does weights (3lbs) for 10 minutes 2x/week. We will continue to monitor his progress.   Expected Outcomes  Short:  exercise consistently between program sessions and home Long:  improve overall MET level  Short: Increase arm crank  Long: Continue to improve stamina.  Short: Increase arm crank, increase Nustep to 2x/week  Long: Continue to improve stamina.  Short: Increase hand weights  Long: Continue to improve stamina  Short: Increase hand weights  Long: Continue to improve stamina   Row Name 02/28/20 1444             Exercise Goal Re-Evaluation   Exercise Goals Review  Increase Physical Activity;Increase Strength and Stamina;Understanding of Exercise Prescription       Comments  Izell [place] has progressed well in HT.  He will have his discharge 6 min walk soon.  He has increased to 5 lb for weights.       Expected Outcomes  Short: complete HT program Long:  maintain exercise on his own           Discharge Exercise Prescription (Final Exercise Prescription Changes): Exercise Prescription Changes - 02/28/20 1400      Home Exercise Plan   Plans to continue exercise at  Home (comment)   walk and stepper   Frequency  Add 2 additional days to program exercise sessions.    Initial Home Exercises Provided  12/22/19       Nutrition:  Target Goals: Understanding of nutrition guidelines, daily intake of sodium <1532m, cholesterol <2062m calories 30% from fat and 7% or less from saturated fats, daily to have 5 or more servings of fruits and vegetables.  Education: Controlling Sodium/Reading Food Labels -Group verbal and written material supporting the discussion of sodium use in heart healthy nutrition. Review and explanation with models, verbal and written materials for utilization of the food label.   Education: General Nutrition Guidelines/Fats and Fiber: -Group instruction provided by verbal, written material, models and posters to present the general guidelines for heart healthy nutrition. Gives an explanation and review of dietary fats and fiber.   Biometrics: Pre Biometrics - 11/28/19 1620      Pre Biometrics   Height  5' 9.5" (1.765 m)    Weight  195 lb 11.2 oz (88.8 kg)    BMI (Calculated)  28.5    Single Leg Stand  8.82 seconds      Post Biometrics - 02/29/20 1543       Post  Biometrics   Height  5' 9.5" (1.765 m)    Weight  199 lb 1.6 oz (90.3 kg)    BMI (Calculated)  28.99       Nutrition Therapy Plan and Nutrition Goals: Nutrition Therapy & Goals - 12/28/19 1301      Nutrition Therapy   Diet  HH, Low Na    Drug/Food Interactions  Statins/Certain Fruits  Protein (specify units)  70-75g    Fiber  30 grams    Whole Grain Foods  3 servings    Saturated Fats  12 max. grams    Fruits and Vegetables  5 servings/day    Sodium  1.5 grams      Personal Nutrition Goals   Nutrition Goal  ST: Lower Na in diet LT: Increase knowledge    Comments  Pt  reports liking vegetables. "too much of the wrong kind". Pt reports eating meat and trying to include more fish. Discussed HH and CKD friendly eating. Pt has been doing research and is motivated.      Intervention Plan   Intervention  Prescribe, educate and counsel regarding individualized specific dietary modifications aiming towards targeted core components such as weight, hypertension, lipid management, diabetes, heart failure and other comorbidities.;Nutrition handout(s) given to patient.    Expected Outcomes  Short Term Goal: Understand basic principles of dietary content, such as calories, fat, sodium, cholesterol and nutrients.;Short Term Goal: A plan has been developed with personal nutrition goals set during dietitian appointment.;Long Term Goal: Adherence to prescribed nutrition plan.       Nutrition Assessments:   MEDIFICTS Score Key:          ?70 Need to make dietary changes          40-70 Heart Healthy Diet         ? 40 Therapeutic Level Cholesterol Diet  Nutrition Goals Re-Evaluation: Nutrition Goals Re-Evaluation    [place] Name 02/01/20 1515 02/15/20 1515           Goals   Nutrition Goal  ST: mindful eating LT: Increase knowledge  ST: try new dessert recipe LT: Increase knowledge      Comment  Pt reports watching what he is eating, now not adding extra salt to food. Pt reports eating less processed foods. Pt wants to lose weight, talked about mindful eating.  Pt reports trying to eat well and is now eating more salads and less "junk food", still loves desserts. Gave pt dessert recipe handout.      Expected Outcome  ST: mindful eating LT: Increase knowledge  ST: try new dessert recipe LT: Increase knowledge         Nutrition Goals Discharge (Final Nutrition Goals Re-Evaluation): Nutrition Goals Re-Evaluation - 02/15/20 1515      Goals   Nutrition Goal  ST: try new dessert recipe LT: Increase knowledge    Comment  Pt reports trying to eat well and is now eating more  salads and less "junk food", still loves desserts. Gave pt dessert recipe handout.    Expected Outcome  ST: try new dessert recipe LT: Increase knowledge       Psychosocial: Target Goals: Acknowledge presence or absence of significant depression and/or stress, maximize coping skills, provide positive support system. Participant is able to verbalize types and ability to use techniques and skills needed for reducing stress and depression.   Education: Depression - Provides group verbal and written instruction on the correlation between heart/lung disease and depressed mood, treatment options, and the stigmas associated with seeking treatment.   Education: Sleep Hygiene -Provides group verbal and written instruction about how sleep can affect your health.  Define sleep hygiene, discuss sleep cycles and impact of sleep habits. Review good sleep hygiene tips.     Education: Stress and Anxiety: - Provides group verbal and written instruction about the health risks of elevated stress and causes of high stress.  Discuss  the correlation between heart/lung disease and anxiety and treatment options. Review healthy ways to manage with stress and anxiety.    Initial Review & Psychosocial Screening: Initial Psych Review & Screening - 11/21/19 1415      Initial Review   Current issues with  Current Sleep Concerns;Current Stress Concerns    Source of Stress Concerns  Chronic Illness;Family    Comments  having to get up to use bathroom      Family Dynamics   Good Support System?  Yes      Barriers   Psychosocial barriers to participate in program  There are no identifiable barriers or psychosocial needs.;The patient should benefit from training in stress management and relaxation.      Screening Interventions   Interventions  Encouraged to exercise;To provide support and resources with identified psychosocial needs;Provide feedback about the scores to participant    Expected Outcomes  Short Term  goal: Utilizing psychosocial counselor, staff and physician to assist with identification of specific Stressors or current issues interfering with healing process. Setting desired goal for each stressor or current issue identified.;Long Term Goal: Stressors or current issues are controlled or eliminated.;Short Term goal: Identification and review with participant of any Quality of Life or Depression concerns found by scoring the questionnaire.;Long Term goal: The participant improves quality of Life and PHQ9 Scores as seen by post scores and/or verbalization of changes       Quality of Life Scores:   Scores of 19 and below usually indicate a poorer quality of life in these areas.  A difference of  2-3 points is a clinically meaningful difference.  A difference of 2-3 points in the total score of the Quality of Life Index has been associated with significant improvement in overall quality of life, self-image, physical symptoms, and general health in studies assessing change in quality of life.  PHQ-9: Recent Review Flowsheet Data    Depression screen [facility] 2/9 11/28/2019 09/13/2018 08/24/2017 08/21/2016 08/14/2015   Decreased Interest 0 0 0 0 0   Down, Depressed, Hopeless 0 0 0 0 0   PHQ - 2 Score 0 0 0 0 0   Altered sleeping 0 - - - -   Tired, decreased energy 3 - - - -   Change in appetite 0 - - - -   Feeling bad or failure about yourself  0 - - - -   Trouble concentrating 0 - - - -   Moving slowly or fidgety/restless 0 - - - -   Suicidal thoughts 0 - - - -   PHQ-9 Score 3 - - - -   Difficult doing work/chores Somewhat difficult - - - -     Interpretation of Total Score  Total Score Depression Severity:  1-4 = Minimal depression, 5-9 = Mild depression, 10-14 = Moderate depression, 15-19 = Moderately severe depression, 20-27 = Severe depression   Psychosocial Evaluation and Intervention: Psychosocial Evaluation - 11/21/19 1422      Psychosocial Evaluation & Interventions   Comments  Izell Albion  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 He has a good support system. He helps care for his wife who had a stroke recently, so that keeps him busy. Taking time for himself may be difficult, but he wants to take care of himself for her. He does have a $20 copay but he is going to try to figure something out    Expected Outcomes  Short: attend cardiac rehab as much as possible. Long: develop  positive self care habits.    Continue Psychosocial Services   Follow up required by staff       Psychosocial Re-Evaluation: Psychosocial Re-Evaluation    [place] Name 12/26/19 1530 02/01/20 1514 02/15/20 1514         Psychosocial Re-Evaluation   Current issues with  Current Sleep Concerns  Current Sleep Concerns  Current Sleep Concerns     Comments  Izell [place] states he sleeps well - he gets up to go the bathroom but doesnt have trouble going back to sleep.  He states he doesnt have any stress at this time.  Izell Hercules states he sleeps well - he gets up to go the bathroom but doesnt have trouble going back to sleep.  He states he doesnt have any stress at this time.  Izell [place] states he sleeps well - he gets up to go the bathroom but doesnt have trouble going back to sleep.  He states he doesnt have any stress at this time.     Expected Outcomes  Short : maintain good sleep and stress management Long:  maintain good coping skills  Short : maintain good sleep and stress management Long:  maintain good coping skills  Short : maintain good sleep and stress management Long:  maintain good coping skills     Interventions  Encouraged to attend Cardiac Rehabilitation for the exercise  Encouraged to attend Cardiac Rehabilitation for the exercise  Encouraged to attend Cardiac Rehabilitation for the exercise     Continue Psychosocial Services   --  Follow up required by staff  Follow up required by staff        Psychosocial Discharge (Final Psychosocial Re-Evaluation): Psychosocial Re-Evaluation - 02/15/20 1514      Psychosocial  Re-Evaluation   Current issues with  Current Sleep Concerns    Comments  Izell [place] states he sleeps well - he gets up to go the bathroom but doesnt have trouble going back to sleep.  He states he doesnt have any stress at this time.    Expected Outcomes  Short : maintain good sleep and stress management Long:  maintain good coping skills    Interventions  Encouraged to attend Cardiac Rehabilitation for the exercise    Continue Psychosocial Services   Follow up required by staff       Vocational Rehabilitation: Provide vocational rehab assistance to qualifying candidates.   Vocational Rehab Evaluation & Intervention: Vocational Rehab - 11/21/19 1422      Initial Vocational Rehab Evaluation & Intervention   Assessment shows need for Vocational Rehabilitation  No       Education: Education Goals: Education classes will be provided on a variety of topics geared toward better understanding of heart health and risk factor modification. Participant will state understanding/return demonstration of topics presented as noted by education test scores.  Learning Barriers/Preferences: Learning Barriers/Preferences - 11/21/19 1422      Learning Barriers/Preferences   Learning Barriers  Hearing    Learning Preferences  None       General Cardiac Education Topics:  AED/CPR: - Group verbal and written instruction with the use of models to demonstrate the basic use of the AED with the basic ABC's of resuscitation.   Anatomy & Physiology of the Heart: - Group verbal and written instruction and models provide basic cardiac anatomy and physiology, with the coronary electrical and arterial systems. Review of Valvular disease and Heart Failure   Cardiac Procedures: - Group verbal and written instruction to review commonly prescribed medications for  heart disease. Reviews the medication, class of the drug, and side effects. Includes the steps to properly store meds and maintain the prescription  regimen. (beta blockers and nitrates)   Cardiac Medications I: - Group verbal and written instruction to review commonly prescribed medications for heart disease. Reviews the medication, class of the drug, and side effects. Includes the steps to properly store meds and maintain the prescription regimen.   Cardiac Medications II: -Group verbal and written instruction to review commonly prescribed medications for heart disease. Reviews the medication, class of the drug, and side effects. (all other drug classes)    Go Sex-Intimacy & Heart Disease, Get SMART - Goal Setting: - Group verbal and written instruction through game format to discuss heart disease and the return to sexual intimacy. Provides group verbal and written material to discuss and apply goal setting through the application of the S.M.A.R.T. Method.   Other Matters of the Heart: - Provides group verbal, written materials and models to describe Stable Angina and Peripheral Artery. Includes description of the disease process and treatment options available to the cardiac patient.   Infection Prevention: - Provides verbal and written material to individual with discussion of infection control including proper hand washing and proper equipment cleaning during exercise session.   Cardiac Rehab from 11/28/2019 in [facility] Cardiac and Pulmonary Rehab  Date  11/28/19  Educator  AS  Instruction Review Code  1- Verbalizes Understanding      Falls Prevention: - Provides verbal and written material to individual with discussion of falls prevention and safety.   Cardiac Rehab from 11/28/2019 in [facility] Cardiac and Pulmonary Rehab  Date  11/28/19  Educator  AS  Instruction Review Code  1- Verbalizes Understanding      Other: -Provides group and verbal instruction on various topics (see comments)   Knowledge Questionnaire Score:   Core Components/Risk Factors/Patient Goals at Admission: Personal Goals and Risk Factors at Admission -  11/28/19 1625      Core Components/Risk Factors/Patient Goals on Admission    Weight Management  Yes    Intervention  Weight Management: Develop a combined nutrition and exercise program designed to reach desired caloric intake, while maintaining appropriate intake of nutrient and fiber, sodium and fats, and appropriate energy expenditure required for the weight goal.;Weight Management: Provide education and appropriate resources to help participant work on and attain dietary goals.    Admit Weight  195 lb 11.2 oz (88.8 kg)       Education:Diabetes - Individual verbal and written instruction to review signs/symptoms of diabetes, desired ranges of glucose level fasting, after meals and with exercise. Acknowledge that pre and post exercise glucose checks will be done for 3 sessions at entry of program.   Education: Know Your Numbers and Risk Factors: -Group verbal and written instruction about important numbers in your health.  Discussion of what are risk factors and how they play a role in the disease process.  Review of Cholesterol, Blood Pressure, Diabetes, and BMI and the role they play in your overall health.   Core Components/Risk Factors/Patient Goals Review:  Goals and Risk Factor Review    Row Name 12/26/19 1529 02/01/20 1519 02/15/20 1516         Core Components/Risk Factors/Patient Goals Review   Personal Goals Review  Weight Management/Obesity;Hypertension;Lipids;Other  Weight Management/Obesity;Hypertension;Lipids;Other  Weight Management/Obesity;Hypertension;Lipids;Other     Review  Izell L[place] is taking meds as directed.  He has a BP monitor but hasnt been checking it at home.  Izell [place]  is taking meds as directed.  He has a BP monitor but hasnt been checking it at home.  Izell [place] is taking meds as directed.  He has a BP monitor but hasnt been checking it at home.     Expected Outcomes  Short - monitor BP once a day at home Long: maintain BP at levels recommended by Dr  Sheran Fava - monitor  BP once a day at home (at least on the days not here) Long: maintain BP at levels recommended by Dr  Sheran Fava - monitor BP once a day at home (at least on the days not here) Long: maintain BP at levels recommended by Dr        Core Components/Risk Factors/Patient Goals at Discharge (Final Review):  Goals and Risk Factor Review - 02/15/20 1516      Core Components/Risk Factors/Patient Goals Review   Personal Goals Review  Weight Management/Obesity;Hypertension;Lipids;Other    Review  Izell Kouts is taking meds as directed.  He has a BP monitor but hasnt been checking it at home.    Expected Outcomes  Short - monitor BP once a day at home (at least on the days not here) Long: maintain BP at levels recommended by Dr       Evelena Peat Comments: ITP Comments    Row Name 11/21/19 1420 12/01/19 1551 12/14/19 0635 12/28/19 1304 01/11/20 0636   ITP Comments  Initial orientation compelted. Diagnosis can [facility] found in [facility] 11/22. EP orientation scheduled fro 2/8 at 3pm  First full day of exercise!  Patient was oriented to gym and equipment including functions, settings, policies, and procedures.  Patient's individual exercise prescription and treatment plan were reviewed.  All starting workloads were established based on the results of the 6 minute walk test done at initial orientation visit.  The plan for exercise progression was also introduced and progression will be customized based on patient's performance and goals.  30 day chart review completed. ITP sent to Dr Zachery Dakins Medical Director, for review,changes as needed and signature. New to program  Completed Initial RD Eval  30 day chart review completed. ITP sent to Dr Zachery Dakins Medical Director, for review,changes as needed and signature. Continue with ITP if no changes requested   [place] Name 02/08/20 0557 03/07/20 0617 03/08/20 1522       ITP Comments  30 Day review completed. Medical Director review done, changes made as directed,and approval shown by  signature of Market researcher.  30 Day review completed. ITP review done, changes made as directed,and approval shown by signature of  Scientist, research (life sciences).  Called to check on patient.  He is no longer able to attend as he cannot find a caregiver for his wife while he is out.  He graduates at 34 sessions.        Comments: Discharge ITP

## 2020-03-08 NOTE — Telephone Encounter (Signed)
Called to check on patient.  He is no longer able to attend as he cannot find a caregiver for his wife while he is out.  He graduates at 34 sessions.

## 2020-03-14 DIAGNOSIS — H6123 Impacted cerumen, bilateral: Secondary | ICD-10-CM | POA: Diagnosis not present

## 2020-03-14 DIAGNOSIS — H903 Sensorineural hearing loss, bilateral: Secondary | ICD-10-CM | POA: Diagnosis not present

## 2020-04-05 DIAGNOSIS — I25119 Atherosclerotic heart disease of native coronary artery with unspecified angina pectoris: Secondary | ICD-10-CM | POA: Diagnosis not present

## 2020-04-05 DIAGNOSIS — R0600 Dyspnea, unspecified: Secondary | ICD-10-CM | POA: Diagnosis not present

## 2020-04-05 DIAGNOSIS — I739 Peripheral vascular disease, unspecified: Secondary | ICD-10-CM | POA: Diagnosis not present

## 2020-04-05 DIAGNOSIS — E785 Hyperlipidemia, unspecified: Secondary | ICD-10-CM | POA: Diagnosis not present

## 2020-04-05 DIAGNOSIS — I10 Essential (primary) hypertension: Secondary | ICD-10-CM | POA: Diagnosis not present

## 2020-04-05 DIAGNOSIS — I4892 Unspecified atrial flutter: Secondary | ICD-10-CM | POA: Diagnosis not present

## 2020-04-05 DIAGNOSIS — R0602 Shortness of breath: Secondary | ICD-10-CM | POA: Diagnosis not present

## 2020-04-19 DIAGNOSIS — C679 Malignant neoplasm of bladder, unspecified: Secondary | ICD-10-CM

## 2020-04-19 HISTORY — DX: Malignant neoplasm of bladder, unspecified: C67.9

## 2020-04-30 DIAGNOSIS — R0602 Shortness of breath: Secondary | ICD-10-CM | POA: Diagnosis not present

## 2020-04-30 DIAGNOSIS — I25119 Atherosclerotic heart disease of native coronary artery with unspecified angina pectoris: Secondary | ICD-10-CM | POA: Diagnosis not present

## 2020-05-09 ENCOUNTER — Other Ambulatory Visit: Payer: Self-pay

## 2020-05-09 ENCOUNTER — Ambulatory Visit: Payer: Medicare PPO | Admitting: Family Medicine

## 2020-05-09 ENCOUNTER — Encounter: Payer: Self-pay | Admitting: Family Medicine

## 2020-05-09 VITALS — BP 106/68 | HR 70 | Temp 97.2°F | Ht 69.5 in | Wt 195.0 lb

## 2020-05-09 DIAGNOSIS — R310 Gross hematuria: Secondary | ICD-10-CM | POA: Diagnosis not present

## 2020-05-09 DIAGNOSIS — N401 Enlarged prostate with lower urinary tract symptoms: Secondary | ICD-10-CM

## 2020-05-09 DIAGNOSIS — R319 Hematuria, unspecified: Secondary | ICD-10-CM | POA: Insufficient documentation

## 2020-05-09 DIAGNOSIS — R351 Nocturia: Secondary | ICD-10-CM

## 2020-05-09 LAB — POC URINALSYSI DIPSTICK (AUTOMATED)
Bilirubin, UA: NEGATIVE
Glucose, UA: NEGATIVE
Ketones, UA: NEGATIVE
Leukocytes, UA: NEGATIVE
Nitrite, UA: NEGATIVE
Protein, UA: POSITIVE — AB
Spec Grav, UA: >=1.03 — AB (ref 1.010–1.025)
Urobilinogen, UA: 0.2 E.U./dL
pH, UA: 5.5 (ref 5.0–8.0)

## 2020-05-09 MED ORDER — CEPHALEXIN 500 MG PO CAPS: 500.0000 mg | ORAL_CAPSULE | Freq: Two times a day (BID) | ORAL | 0 refills | Status: DC

## 2020-05-09 NOTE — Progress Notes (Signed)
Subjective:    Patient ID: Brandon Lewis, male    DOB: 1934-09-23, 86 y.o.   MRN: [id number]  This visit occurred during the SARS-CoV-2 public health emergency.  Safety protocols were in place, including screening questions prior to the visit, additional usage of staff PPE, and extensive cleaning of exam room while observing appropriate contact time as indicated for disinfecting solutions.    HPI Pt presents for urinary c/o  86 yo pt of Brandon Lewis Readings from Last 3 Encounters:  05/09/20 195 lb (88.5 kg)  02/29/20 199 lb 1.6 oz (90.3 kg)  01/17/20 194 lb (88 kg)   28.38 kg/m  Has noted blood in urine for 3-4 days A few clots are possible  This happened several years ago with kidney stone  No pain at all  Some bladder pressure  No trouble urinating  No injury or trauma  Feels ok   He cares for wife -post cva /requires a lot of care   No fever or malaise No dysuria Did have some abdominal cramping last week  He does not see a urologist  If he wanted to see one- Brandon Lewis in [place]   ua today: Results for orders placed or performed in visit on 05/09/20  POCT Urinalysis Dipstick (Automated)  Result Value Ref Range   Color, UA Brown    Clarity, UA clear    Glucose, UA Negative Negative   Bilirubin, UA negative    Ketones, UA negative    Spec Grav, UA >=1.030 (A) 1.010 - 1.025   Blood, UA 3+    pH, UA 5.5 5.0 - 8.0   Protein, UA Positive (A) Negative   Urobilinogen, UA 0.2 0.2 or 1.0 E.U./dL   Nitrite, UA negative    Leukocytes, UA Negative Negative     He has h/o CKD stage 3 Lab Results  Component Value Date   CREATININE 1.61 (H) 11/16/2019   BUN 29 (H) 11/16/2019   NA 136 11/16/2019   K 4.6 11/16/2019   CL 105 11/16/2019   CO2 25 11/16/2019   Also BPH Takes flomax   Takes plavix for heart issues  Also has h/o thrombocytopenia in the past - last pl ct was ok   Lab Results  Component Value Date   WBC 8.3 11/16/2019   HGB 9.9 (L)  11/16/2019   HCT 31.1 (L) 11/16/2019   MCV 110.5 Repeated and verified X2. (H) 11/16/2019   PLT 159.0 11/16/2019      Patient Active Problem List   Diagnosis Date Noted  . Hematuria 05/09/2020  . BPH (benign prostatic hyperplasia) 11/16/2019  . S/P CABG x 4   . Atrial flutter ([place])   . Coronary artery disease 09/16/2019  . Allergic rhinitis 09/12/2019  . Coronary artery disease due to lipid rich plaque   . Essential hypertension   . Sciatica, right side 09/22/2018  . Chronic renal disease, stage III 09/14/2018  . Right hip pain 09/13/2018  . Thrombocytopenia ([place]) 08/25/2017  . Actinic keratosis 08/14/2015  . Advanced directives, counseling/discussion 07/25/2014  . Routine general medical examination at a health care facility 06/25/2012  . IMPAIRED FASTING GLUCOSE 03/08/2010  . CAD, NATIVE VESSEL 05/30/2009  . Hyperlipemia 03/02/2009  . Essential hypertension, benign 09/22/2007  . Osteoarthritis, generalized 09/22/2007   Past Medical History:  Diagnosis Date  . CAD (coronary artery disease)    s/p PCI after presenting with exertional angina (drug eluting stent)  . Diverticulosis   . GERD (  gastroesophageal reflux disease)   . HTN (hypertension)   . Hyperlipidemia   . Osteoarthritis   . PVD (peripheral vascular disease) ([place])    s/p aortobifemoral bypass, with bilateral SFA occlusion and intermittent claudication   . Small bowel obstruction [facility])    Past Surgical History:  Procedure Laterality Date  . aorto-bifem  2003   [facility]   . CAROTID STENT     [facility]  . CATARACT EXTRACTION, BILATERAL  2017  . CHOLECYSTECTOMY  2002  . CORONARY ARTERY BYPASS GRAFT N/A 09/16/2019   Procedure: CORONARY ARTERY BYPASS GRAFTING (CABG) using LIMA to LAD; Endoscopic right saphenous vein harvest to OM1, Diag1, and RCA.;  Surgeon: Brandon Olds, MD;  Location: [facility];  Service: Open Heart Surgery;  Laterality: N/A;  . LEFT HEART CATH AND CORONARY ANGIOGRAPHY N/A 09/12/2019   Procedure:  LEFT HEART CATH AND CORONARY ANGIOGRAPHY;  Surgeon: Brandon Skains, MD;  Location: [place] [facility];  Service: Cardiovascular;  Laterality: N/A;  . TEE WITHOUT CARDIOVERSION N/A 09/16/2019   Procedure: TRANSESOPHAGEAL ECHOCARDIOGRAM (TEE);  Surgeon: Brandon Olds, MD;  Location: [facility];  Service: Open Heart Surgery;  Laterality: N/A;  . TONSILLECTOMY    . WISDOM TOOTH EXTRACTION     Social History   Tobacco Use  . Smoking status: Former Smoker    Types: Cigarettes    Quit date: 10/20/1992    Years since quitting: 27.5  . Smokeless tobacco: Never Used  . Tobacco comment: quit in 1994   Substance Use Topics  . Alcohol use: Not Currently    Comment: rare  . Drug use: No   Family History  Problem Relation Age of Onset  . Diabetes Father        DM - and brother   . Stroke Father   . Coronary artery disease Paternal Grandfather   . Colon cancer Neg Hx   . Prostate cancer Neg Hx    Allergies  Allergen Reactions  . Cilostazol Other (See Comments)    REACTION: stomach problems- constipation  . Simvastatin Other (See Comments)    REACTION: myalgia   Current Outpatient Medications on File Prior to Visit  Medication Sig Dispense Refill  . aspirin 81 MG chewable tablet Chew 1 tablet (81 mg total) by mouth daily.    . cholecalciferol (VITAMIN D) 1000 UNITS tablet Take 1,000 Units by mouth at bedtime.     . clopidogrel (PLAVIX) 75 MG tablet TAKE ONE TABLET BY MOUTH EVERY DAY *BOTTLE* 90 tablet 3  . Magnesium 400 MG CAPS Take by mouth daily.    . metoprolol succinate (TOPROL-XL) 50 MG 24 hr tablet **VIAL ONLY** TAKE (1) TABLET BY MOUTH ONCE A DAY.DO NOT CRUSH. (HIGHBLOOD PRESSURE) *BOTTLE* 30 tablet 11  . Multiple Vitamin (MULTIVITAMIN WITH MINERALS) TABS tablet Take 1 tablet by mouth every morning.    . rosuvastatin (CRESTOR) 10 MG tablet **VIAL ONLY** TAKE ONE TABLET BY MOUTH AT BEDTIME. (IMPROVES CHOLESTEROL) 90 tablet 3  . sodium chloride (BRONCHO SALINE) inhaler solution  Take 1 spray by nebulization daily as needed (congestion).    . tamsulosin (FLOMAX) 0.4 MG CAPS capsule TAKE 1 CAPSULE BY MOUTH ONCE DAILY 30 capsule 11   No current facility-administered medications on file prior to visit.     Review of Systems  Constitutional: Negative for activity change, appetite change, diaphoresis, fatigue, fever and unexpected weight change.  HENT: Positive for hearing loss. Negative for congestion, rhinorrhea, sore throat and trouble swallowing.  Pt is entirely deaf even with hearing aides  Obtained history by writing on paper  Eyes: Negative for pain, redness, itching and visual disturbance.  Respiratory: Negative for cough, chest tightness, shortness of breath and wheezing.   Cardiovascular: Negative for chest pain and palpitations.  Gastrointestinal: Negative for abdominal pain, blood in stool, constipation, diarrhea and nausea.  Endocrine: Negative for cold intolerance, heat intolerance, polydipsia and polyuria.  Genitourinary: Positive for hematuria. Negative for decreased urine volume, difficulty urinating, dysuria, flank pain, frequency, genital sores, penile pain, testicular pain and urgency.  Musculoskeletal: Negative for arthralgias, joint swelling and myalgias.  Skin: Negative for pallor and rash.  Neurological: Negative for dizziness, tremors, weakness, numbness and headaches.  Hematological: Negative for adenopathy. Does not bruise/bleed easily.  Psychiatric/Behavioral: Negative for decreased concentration and dysphoric mood. The patient is not nervous/anxious.        Objective:   Physical Exam Constitutional:      General: He is not in acute distress.    Appearance: Normal appearance. He is well-developed and normal weight. He is not ill-appearing.  HENT:     Head: Normocephalic and atraumatic.     Ears:     Comments: Unable to hear  Has hearing aides Eyes:     Conjunctiva/sclera: Conjunctivae normal.     Pupils: Pupils are equal,  round, and reactive to light.  Cardiovascular:     Rate and Rhythm: Normal rate and regular rhythm.     Heart sounds: Normal heart sounds.  Pulmonary:     Effort: Pulmonary effort is normal.     Breath sounds: Normal breath sounds.  Abdominal:     General: Bowel sounds are normal. There is no distension.     Palpations: Abdomen is soft.     Tenderness: There is abdominal tenderness. There is no rebound.     Comments: No cva tenderness  No suprapubic tenderness or fullness    Musculoskeletal:     Cervical back: Normal range of motion and neck supple.  Lymphadenopathy:     Cervical: No cervical adenopathy.  Skin:    Coloration: Skin is not jaundiced or pale.     Findings: No erythema or rash.  Neurological:     Mental Status: He is alert.     Coordination: Coordination normal.  Psychiatric:        Mood and Affect: Mood normal.           Assessment & Plan:   Problem List Items Addressed This Visit      Genitourinary   BPH (benign prostatic hyperplasia)    Stable with flomax -does not currently see a urologist        Other   Hematuria - Primary    Gross hematuria with bladder pressure in pt who takes plavix and has BPH and past kidney stone  Last platelet ct was nl  No pain in flank or abdomen and reassuring exam  ua with positive protein and blood (urine is brown /not red)  Pend culture , will empirically tx for uti with keflex  Will likely need urology referral inst to call if symptoms worsen or new ones develop         Relevant Orders   Urine Culture    Other Visit Diagnoses    Hematuria, gross       Relevant Orders   POCT Urinalysis Dipstick (Automated) (Completed)

## 2020-05-09 NOTE — Assessment & Plan Note (Signed)
Stable with flomax -does not currently see a urologist

## 2020-05-09 NOTE — Assessment & Plan Note (Signed)
Gross hematuria with bladder pressure in pt who takes plavix and has BPH and past kidney stone  Last platelet ct was nl  No pain in flank or abdomen and reassuring exam  ua with positive protein and blood (urine is brown /not red)  Pend culture , will empirically tx for uti with keflex  Will likely need urology referral inst to call if symptoms worsen or new ones develop

## 2020-05-09 NOTE — Patient Instructions (Addendum)
I want to treat you for a possible uti  This can cause blood in urine-especially since you take plavix  I sent a px for keflex to your pharmacy-start it now  We will culture your urine to see if it grows bacteria and let you know when that comes back in 2-4 days   Drink lots of water  Alert [place] if symptoms suddenly worsen in the meantime   Depending on results - we may refer you to a urologist

## 2020-05-10 LAB — URINE CULTURE
MICRO NUMBER:: 10732378
SPECIMEN QUALITY:: ADEQUATE

## 2020-05-11 ENCOUNTER — Telehealth: Payer: Self-pay | Admitting: Family Medicine

## 2020-05-11 DIAGNOSIS — R310 Gross hematuria: Secondary | ICD-10-CM

## 2020-05-11 NOTE — Telephone Encounter (Signed)
Patient has been scheduled for Wednesday July 28th at [facility].

## 2020-05-11 NOTE — Telephone Encounter (Signed)
-----   Message from Brandon Lewis, [place] sent at 05/11/2020  1:02 PM EDT ----- Pt notified of urine cx results and Dr. Marliss Coots comments. Pt said he is still having blood in her urine and it hasn't improved at all. Pt agrees with urologist referral, he would like to go to [facility] and see someone at [place] [facility], I advised pt our [facility] will call to schedule appt

## 2020-05-16 ENCOUNTER — Ambulatory Visit: Payer: Self-pay | Admitting: Urology

## 2020-05-16 NOTE — Progress Notes (Incomplete)
03/20/20 7:54 AM   Brandon Lewis November 05, 1933 [id number]  Referring provider: Venia Carbon, MD [place],  [place] [address] No chief complaint on file.   HPI: Brandon Lewis is a 86 y.o. male who presents today for griss hema turia       PMH: Past Medical History:  Diagnosis Date  . CAD (coronary artery disease)    s/p PCI after presenting with exertional angina (drug eluting stent)  . Diverticulosis   . GERD (gastroesophageal reflux disease)   . HTN (hypertension)   . Hyperlipidemia   . Osteoarthritis   . PVD (peripheral vascular disease) ([place])    s/p aortobifemoral bypass, with bilateral SFA occlusion and intermittent claudication   . Small bowel obstruction [facility])     Surgical History: Past Surgical History:  Procedure Laterality Date  . aorto-bifem  2003   [facility]   . CAROTID STENT     [facility]  . CATARACT EXTRACTION, BILATERAL  2017  . CHOLECYSTECTOMY  2002  . CORONARY ARTERY BYPASS GRAFT N/A 09/16/2019   Procedure: CORONARY ARTERY BYPASS GRAFTING (CABG) using LIMA to LAD; Endoscopic right saphenous vein harvest to OM1, Diag1, and RCA.;  Surgeon: Wonda Olds, MD;  Location: [facility];  Service: Open Heart Surgery;  Laterality: N/A;  . LEFT HEART CATH AND CORONARY ANGIOGRAPHY N/A 09/12/2019   Procedure: LEFT HEART CATH AND CORONARY ANGIOGRAPHY;  Surgeon: Corey Skains, MD;  Location: [place] [facility];  Service: Cardiovascular;  Laterality: N/A;  . TEE WITHOUT CARDIOVERSION N/A 09/16/2019   Procedure: TRANSESOPHAGEAL ECHOCARDIOGRAM (TEE);  Surgeon: Wonda Olds, MD;  Location: [facility];  Service: Open Heart Surgery;  Laterality: N/A;  . TONSILLECTOMY    . WISDOM TOOTH EXTRACTION      Home Medications:  Allergies as of 05/16/2020      Reactions   Cilostazol Other (See Comments)   REACTION: stomach problems- constipation   Simvastatin Other (See Comments)   REACTION: myalgia      Medication List       Accurate as of  May 16, 2020  7:54 AM. If you have any questions, ask your nurse or doctor.        aspirin 81 MG chewable tablet Chew 1 tablet (81 mg total) by mouth daily.   cephALEXin 500 MG capsule Commonly known as: KEFLEX Take 1 capsule (500 mg total) by mouth 2 (two) times daily.   cholecalciferol 1000 units tablet Commonly known as: VITAMIN D Take 1,000 Units by mouth at bedtime.   clopidogrel 75 MG tablet Commonly known as: PLAVIX TAKE ONE TABLET BY MOUTH EVERY DAY *BOTTLE*   Magnesium 400 MG Caps Take by mouth daily.   metoprolol succinate 50 MG 24 hr tablet Commonly known as: TOPROL-XL **VIAL ONLY** TAKE (1) TABLET BY MOUTH ONCE A DAY.DO NOT CRUSH. (HIGHBLOOD PRESSURE) *BOTTLE*   multivitamin with minerals Tabs tablet Take 1 tablet by mouth every morning.   rosuvastatin 10 MG tablet Commonly known as: CRESTOR **VIAL ONLY** TAKE ONE TABLET BY MOUTH AT BEDTIME. (IMPROVES CHOLESTEROL)   sodium chloride inhaler solution Commonly known as: BRONCHO SALINE Take 1 spray by nebulization daily as needed (congestion).   tamsulosin 0.4 MG Caps capsule Commonly known as: FLOMAX TAKE 1 CAPSULE BY MOUTH ONCE DAILY       Allergies:  Allergies  Allergen Reactions  . Cilostazol Other (See Comments)    REACTION: stomach problems- constipation  . Simvastatin Other (See Comments)    REACTION:  myalgia    Family History: Family History  Problem Relation Age of Onset  . Diabetes Father        DM - and brother   . Stroke Father   . Coronary artery disease Paternal Grandfather   . Colon cancer Neg Hx   . Prostate cancer Neg Hx     Social History:  reports that he quit smoking about 27 years ago. His smoking use included cigarettes. He has never used smokeless tobacco. He reports previous alcohol use. He reports that he does not use drugs.   Physical Exam: There were no vitals taken for this visit.  Constitutional:  Alert and oriented, No acute distress. HEENT: [place] AT, moist  mucus membranes.  Trachea midline, no masses. Cardiovascular: No clubbing, cyanosis, or edema. Respiratory: Normal respiratory effort, no increased work of breathing. GI: Abdomen is soft, nontender, nondistended, no abdominal masses GU: No CVA tenderness Lymph: No cervical or inguinal lymphadenopathy. Skin: No rashes, bruises or suspicious lesions. Neurologic: Grossly intact, no focal deficits, moving all 4 extremities. Psychiatric: Normal mood and affect.  Laboratory Data:  Lab Results  Component Value Date   CREATININE 1.61 (H) 11/16/2019    Lab Results  Component Value Date   PSA 1.02 09/22/2007    No results found for: TESTOSTERONE  Lab Results  Component Value Date   HGBA1C 6.5 (H) 09/15/2019    Urinalysis   Pertinent Imaging: *** Results for orders placed during the hospital encounter of 09/12/19  DG Abd 1 View  Narrative CLINICAL DATA:  86 year old male with NG tube placement.  EXAM: ABDOMEN - 1 VIEW  COMPARISON:  Earlier radiograph dated 09/20/2019.  FINDINGS: Interval placement of an enteric tube with side-port in the region of the gastroesophageal junction and tip in the proximal stomach. Recommend further advancing of the tube by additional at least 5 cm for optimal positioning. Partially visualized PICC with tip close to the cavoatrial junction. There is mild eventration of the right hemidiaphragm. Median sternotomy wires and CABG vascular clips. Several linear radiopaque foreign objects noted over the upper abdomen.  IMPRESSION: 1. Enteric tube with tip in the proximal stomach. Recommend further advancing of the tube by additional 5 cm for optimal positioning. 2. Partially visualized PICC with tip close to the cavoatrial junction.   Electronically Signed By: Anner Crete M.D. On: 09/20/2019 18:06  No results found for this or any previous visit.  No results found for this or any previous visit.  No results found for this or any  previous visit.  No results found for this or any previous visit.  No results found for this or any previous visit.  No results found for this or any previous visit.  No results found for this or any previous visit.   Assessment & Plan:     No follow-ups on file.  [facility] [address] [place], [place] [address] [phone]  I, Joneen Boers Peace, am acting as a Education administrator for Dr. Nicki Reaper [email or web address]

## 2020-05-17 ENCOUNTER — Encounter: Payer: Self-pay | Admitting: Internal Medicine

## 2020-05-17 ENCOUNTER — Ambulatory Visit (INDEPENDENT_AMBULATORY_CARE_PROVIDER_SITE_OTHER): Payer: Medicare PPO | Admitting: Internal Medicine

## 2020-05-17 ENCOUNTER — Other Ambulatory Visit: Payer: Self-pay

## 2020-05-17 VITALS — BP 130/74 | HR 73 | Temp 97.4°F | Ht 70.0 in | Wt 198.0 lb

## 2020-05-17 DIAGNOSIS — N1832 Chronic kidney disease, stage 3b: Secondary | ICD-10-CM

## 2020-05-17 DIAGNOSIS — Z0000 Encounter for general adult medical examination without abnormal findings: Secondary | ICD-10-CM | POA: Diagnosis not present

## 2020-05-17 DIAGNOSIS — I7121 Aneurysm of the ascending aorta, without rupture: Secondary | ICD-10-CM | POA: Insufficient documentation

## 2020-05-17 DIAGNOSIS — Z7189 Other specified counseling: Secondary | ICD-10-CM

## 2020-05-17 DIAGNOSIS — I700 Atherosclerosis of aorta: Secondary | ICD-10-CM | POA: Diagnosis not present

## 2020-05-17 DIAGNOSIS — I10 Essential (primary) hypertension: Secondary | ICD-10-CM | POA: Diagnosis not present

## 2020-05-17 DIAGNOSIS — I2510 Atherosclerotic heart disease of native coronary artery without angina pectoris: Secondary | ICD-10-CM

## 2020-05-17 DIAGNOSIS — I712 Thoracic aortic aneurysm, without rupture, unspecified: Secondary | ICD-10-CM

## 2020-05-17 DIAGNOSIS — I2583 Coronary atherosclerosis due to lipid rich plaque: Secondary | ICD-10-CM | POA: Diagnosis not present

## 2020-05-17 NOTE — Progress Notes (Signed)
Subjective:    Patient ID: Brandon Lewis, male    DOB: 09/13/1934, 86 y.o.   MRN: [id number]  HPI Here for Medicare wellness visit and follow up of chronic health conditions This visit occurred during the SARS-CoV-2 public health emergency.  Safety protocols were in place, including screening questions prior to the visit, additional usage of staff PPE, and extensive cleaning of exam room while observing appropriate contact time as indicated for disinfecting solutions.   Reviewed forms and advanced directives Reviewed other doctors Occasional drink of alcohol No tobacco Finished cardiac rehab--now doing his own exercise Vision is okay Hearing poor----has hearing aides No falls No depression or anhedonia Independent with instrumental ADLs No sig memory issues  Gross hematuria is clearing up Lower abdominal discomfort is also clearing up also Has appt with urologist next week Urine flow is okay---but trouble emptying (despite the tamsulosin) Nocturia x 4  No chest pain  Breathing is okay No palpitations No syncope or dizziness No edema  Did have follow up CT for thoracic aortic aneurysm--stable Also with atherosclerosis  Current Outpatient Medications on File Prior to Visit  Medication Sig Dispense Refill  . aspirin 81 MG chewable tablet Chew 1 tablet (81 mg total) by mouth daily.    . cholecalciferol (VITAMIN D) 1000 UNITS tablet Take 1,000 Units by mouth at bedtime.     . clopidogrel (PLAVIX) 75 MG tablet TAKE ONE TABLET BY MOUTH EVERY DAY *BOTTLE* 90 tablet 3  . Magnesium 400 MG CAPS Take by mouth daily.    . metoprolol succinate (TOPROL-XL) 50 MG 24 hr tablet **VIAL ONLY** TAKE (1) TABLET BY MOUTH ONCE A DAY.DO NOT CRUSH. (HIGHBLOOD PRESSURE) *BOTTLE* 30 tablet 11  . Multiple Vitamin (MULTIVITAMIN WITH MINERALS) TABS tablet Take 1 tablet by mouth every morning.    . rosuvastatin (CRESTOR) 10 MG tablet **VIAL ONLY** TAKE ONE TABLET BY MOUTH AT BEDTIME. (IMPROVES  CHOLESTEROL) 90 tablet 3  . sodium chloride (BRONCHO SALINE) inhaler solution Take 1 spray by nebulization daily as needed (congestion).    . tamsulosin (FLOMAX) 0.4 MG CAPS capsule TAKE 1 CAPSULE BY MOUTH ONCE DAILY 30 capsule 11   No current facility-administered medications on file prior to visit.    Allergies  Allergen Reactions  . Cilostazol Other (See Comments)    REACTION: stomach problems- constipation  . Simvastatin Other (See Comments)    REACTION: myalgia    Past Medical History:  Diagnosis Date  . CAD (coronary artery disease)    s/p PCI after presenting with exertional angina (drug eluting stent)  . Diverticulosis   . GERD (gastroesophageal reflux disease)   . HTN (hypertension)   . Hyperlipidemia   . Osteoarthritis   . PVD (peripheral vascular disease) ([place])    s/p aortobifemoral bypass, with bilateral SFA occlusion and intermittent claudication   . Small bowel obstruction [facility])     Past Surgical History:  Procedure Laterality Date  . aorto-bifem  2003   [facility]   . CAROTID STENT     [facility]  . CATARACT EXTRACTION, BILATERAL  2017  . CHOLECYSTECTOMY  2002  . CORONARY ARTERY BYPASS GRAFT N/A 09/16/2019   Procedure: CORONARY ARTERY BYPASS GRAFTING (CABG) using LIMA to LAD; Endoscopic right saphenous vein harvest to OM1, Diag1, and RCA.;  Surgeon: Wonda Olds, MD;  Location: [facility];  Service: Open Heart Surgery;  Laterality: N/A;  . LEFT HEART CATH AND CORONARY ANGIOGRAPHY N/A 09/12/2019   Procedure: LEFT HEART CATH AND CORONARY ANGIOGRAPHY;  Surgeon:  Corey Skains, MD;  Location: [facility];  Service: Cardiovascular;  Laterality: N/A;  . TEE WITHOUT CARDIOVERSION N/A 09/16/2019   Procedure: TRANSESOPHAGEAL ECHOCARDIOGRAM (TEE);  Surgeon: Wonda Olds, MD;  Location: [facility];  Service: Open Heart Surgery;  Laterality: N/A;  . TONSILLECTOMY    . WISDOM TOOTH EXTRACTION      Family History  Problem Relation Age of Onset  . Diabetes Father         DM - and brother   . Stroke Father   . Coronary artery disease Paternal Grandfather   . Colon cancer Neg Hx   . Prostate cancer Neg Hx     Social History   Socioeconomic History  . Marital status: Married    Spouse name: Not on file  . Number of children: 3  . Years of education: Not on file  . Highest education level: Not on file  Occupational History  . Occupation: Landscape architect    Comment: Retired  Tobacco Use  . Smoking status: Former Smoker    Types: Cigarettes    Quit date: 10/20/1992    Years since quitting: 27.5  . Smokeless tobacco: Never Used  . Tobacco comment: quit in 1994   Substance and Sexual Activity  . Alcohol use: Not Currently    Comment: rare  . Drug use: No  . Sexual activity: Not on file  Other Topics Concern  . Not on file  Social History Narrative   Widowed; then remarried; 3 sons.      Has a living will - wife has health care POA    Son [name] would be alternate   Would want resuscitation attempts   Would accept brief trial of artificial nutrition      Pt signed designated party release form and gives Breckin Savannah [phone] (home #), access to medical records. Can also leave msg on home answering machine. Cell # [phone]   Social Determinants of Health   Financial Resource Strain:   . Difficulty of Paying Living Expenses:   Food Insecurity:   . Worried About Charity fundraiser in the Last Year:   . Arboriculturist in the Last Year:   Transportation Needs:   . Film/video editor (Medical):   Marland Kitchen Lack of Transportation (Non-Medical):   Physical Activity:   . Days of Exercise per Week:   . Minutes of Exercise per Session:   Stress:   . Feeling of Stress :   Social Connections:   . Frequency of Communication with Friends and Family:   . Frequency of Social Gatherings with Friends and Family:   . Attends Religious Services:   . Active Member of Clubs or Organizations:   . Attends Archivist Meetings:   Marland Kitchen  Marital Status:   Intimate Partner Violence:   . Fear of Current or Ex-Partner:   . Emotionally Abused:   Marland Kitchen Physically Abused:   . Sexually Abused:    Review of Systems No headaches Appetite is good--trying to eat healthier Weight stable Wears seat belt Teeth are fine--keeps up with dentist No suspicious skin lesions Occasional heartburn--no dysphagia Bowels are regular Back pain is better---prn with physiatrist only    Objective:   Physical Exam Constitutional:      Appearance: Normal appearance.  HENT:     Mouth/Throat:     Mouth: Mucous membranes are moist.     Comments: No lesions Eyes:     Conjunctiva/sclera: Conjunctivae normal.  Pupils: Pupils are equal, round, and reactive to light.  Cardiovascular:     Rate and Rhythm: Normal rate and regular rhythm.     Heart sounds: No murmur heard.  No gallop.      Comments: Feet warm but barely palpable pulses Pulmonary:     Effort: Pulmonary effort is normal.     Breath sounds: Normal breath sounds. No wheezing or rales.  Abdominal:     Palpations: Abdomen is soft.     Tenderness: There is no abdominal tenderness.  Musculoskeletal:     Cervical back: Neck supple.     Right lower leg: No edema.     Left lower leg: No edema.  Lymphadenopathy:     Cervical: No cervical adenopathy.  Skin:    Comments: No lesions  Neurological:     Mental Status: He is alert and oriented to person, place, and time.     Comments: President--- "Zoila Shutter, Obama" [id number] Recall 3/3  Psychiatric:        Mood and Affect: Mood normal.        Behavior: Behavior normal.            Assessment & Plan:

## 2020-05-17 NOTE — Assessment & Plan Note (Signed)
Will recheck Consider restarting low dose losartan

## 2020-05-17 NOTE — Assessment & Plan Note (Signed)
See social history 

## 2020-05-17 NOTE — Assessment & Plan Note (Signed)
On CT Is on statin, asa, plavix

## 2020-05-17 NOTE — Progress Notes (Signed)
Hearing Screening   125Hz  250Hz  500Hz  1000Hz  2000Hz  3000Hz  4000Hz  6000Hz  8000Hz   Right ear:           Left ear:           Comments: Has hearing aids. Still hearing impaired.  Vision Screening Comments: August 2020

## 2020-05-17 NOTE — Assessment & Plan Note (Signed)
Doing well after CABP On metoprolol, statin, ASA, plavix

## 2020-05-17 NOTE — Assessment & Plan Note (Signed)
BP Readings from Last 3 Encounters:  05/17/20 (!) 130/74  05/09/20 106/68  01/17/20 124/73   Good control Consider restarting low dose losartan if goes up

## 2020-05-17 NOTE — Assessment & Plan Note (Signed)
Getting yearly checks now---last in November

## 2020-05-17 NOTE — Assessment & Plan Note (Signed)
I have personally reviewed the Medicare Annual Wellness questionnaire and have noted 1. The patient's medical and social history 2. Their use of alcohol, tobacco or illicit drugs 3. Their current medications and supplements 4. The patient's functional ability including ADL's, fall risks, home safety risks and hearing or visual             impairment. 5. Diet and physical activities 6. Evidence for depression or mood disorders  The patients weight, height, BMI and visual acuity have been recorded in the chart I have made referrals, counseling and provided education to the patient based review of the above and I have provided the pt with a written personalized care plan for preventive services.  I have provided you with a copy of your personalized plan for preventive services. Please take the time to review along with your updated medication list.  Td if any injury shingrix at pharmacy No cancer screening due to age Flu vaccine in the fall Discussed fitness

## 2020-05-18 LAB — CBC
HCT: 33.9 % — ABNORMAL LOW (ref 39.0–52.0)
Hemoglobin: 11.2 g/dL — ABNORMAL LOW (ref 13.0–17.0)
MCHC: 32.9 g/dL (ref 30.0–36.0)
MCV: 119.7 fl — ABNORMAL HIGH (ref 78.0–100.0)
Platelets: 122 10*3/uL — ABNORMAL LOW (ref 150.0–400.0)
RBC: 2.83 Mil/uL — ABNORMAL LOW (ref 4.22–5.81)
RDW: 18.2 % — ABNORMAL HIGH (ref 11.5–15.5)
WBC: 7.6 10*3/uL (ref 4.0–10.5)

## 2020-05-18 LAB — RENAL FUNCTION PANEL
Albumin: 3.9 g/dL (ref 3.5–5.2)
BUN: 24 mg/dL — ABNORMAL HIGH (ref 6–23)
CO2: 27 mEq/L (ref 19–32)
Calcium: 9.4 mg/dL (ref 8.4–10.5)
Chloride: 106 mEq/L (ref 96–112)
Creatinine, Ser: 1.54 mg/dL — ABNORMAL HIGH (ref 0.40–1.50)
GFR: 43.24 mL/min — ABNORMAL LOW (ref >60.00)
Glucose, Bld: 99 mg/dL (ref 70–99)
Phosphorus: 3.1 mg/dL (ref 2.3–4.6)
Potassium: 4.4 mEq/L (ref 3.5–5.1)
Sodium: 139 mEq/L (ref 135–145)

## 2020-05-18 LAB — LIPID PANEL
Cholesterol: 137 mg/dL (ref 0–200)
HDL: 29.8 mg/dL — ABNORMAL LOW (ref >39.00)
LDL Cholesterol: 82 mg/dL (ref 0–99)
NonHDL: 106.77
Total CHOL/HDL Ratio: 5
Triglycerides: 126 mg/dL (ref 0.0–149.0)
VLDL: 25.2 mg/dL (ref 0.0–40.0)

## 2020-05-18 LAB — HEPATIC FUNCTION PANEL
ALT: 11 U/L (ref 0–53)
AST: 20 U/L (ref 0–37)
Albumin: 3.9 g/dL (ref 3.5–5.2)
Alkaline Phosphatase: 72 U/L (ref 39–117)
Bilirubin, Direct: 0.1 mg/dL (ref 0.0–0.3)
Total Bilirubin: 0.7 mg/dL (ref 0.2–1.2)
Total Protein: 7.2 g/dL (ref 6.0–8.3)

## 2020-05-23 ENCOUNTER — Encounter: Payer: Self-pay | Admitting: Urology

## 2020-05-23 ENCOUNTER — Ambulatory Visit (INDEPENDENT_AMBULATORY_CARE_PROVIDER_SITE_OTHER): Payer: Medicare PPO | Admitting: Urology

## 2020-05-23 ENCOUNTER — Other Ambulatory Visit: Payer: Self-pay

## 2020-05-23 VITALS — BP 162/83 | HR 82 | Ht 70.0 in | Wt 198.0 lb

## 2020-05-23 DIAGNOSIS — R310 Gross hematuria: Secondary | ICD-10-CM | POA: Diagnosis not present

## 2020-05-23 DIAGNOSIS — N401 Enlarged prostate with lower urinary tract symptoms: Secondary | ICD-10-CM

## 2020-05-24 ENCOUNTER — Encounter: Payer: Self-pay | Admitting: Urology

## 2020-05-24 NOTE — Progress Notes (Signed)
05/23/2020 11:13 AM   [name] 1934-07-19 [id number]  Referring provider: Abner Greenspan, MD [address] [place],   [address]  Chief Complaint  Patient presents with   Hematuria    HPI: Brandon Lewis is an 86 y.o. male seen at the request of Dr. Glori Bickers for evaluation of gross hematuria.   Onset of total gross painless hematuria 3 weeks ago  Blood was persistent for 10-14 days then resolve 2 days ago  Urine was described as darker red with small clots then turning tea colored  No dysuria, urine culture was negative  Denied flank, abdominal or pelvic pain   On aspirin and Plavix  No previous history of gross hematuria  Baseline frequency and nocturia x4-5 on tamsulosin  Past history of renal calculi treated with shockwave lithotripsy 20+ years ago   PMH: Past Medical History:  Diagnosis Date   CAD (coronary artery disease)    s/p PCI after presenting with exertional angina (drug eluting stent)   Diverticulosis    GERD (gastroesophageal reflux disease)    HTN (hypertension)    Hyperlipidemia    Osteoarthritis    PVD (peripheral vascular disease) ([facility])    s/p aortobifemoral bypass, with bilateral SFA occlusion and intermittent claudication    Small bowel obstruction ([place])     Surgical History: Past Surgical History:  Procedure Laterality Date   aorto-bifem  2003   [facility]    CAROTID STENT     [facility]   CATARACT EXTRACTION, BILATERAL  2017   CHOLECYSTECTOMY  2002   CORONARY ARTERY BYPASS GRAFT N/A 09/16/2019   Procedure: CORONARY ARTERY BYPASS GRAFTING (CABG) using LIMA to LAD; Endoscopic right saphenous vein harvest to OM1, Diag1, and RCA.;  Surgeon: Wonda Olds, MD;  Location: [facility];  Service: Open Heart Surgery;  Laterality: N/A;   LEFT HEART CATH AND CORONARY ANGIOGRAPHY N/A 09/12/2019   Procedure: LEFT HEART CATH AND CORONARY ANGIOGRAPHY;  Surgeon: Corey Skains, MD;  Location: [facility];   Service: Cardiovascular;  Laterality: N/A;   TEE WITHOUT CARDIOVERSION N/A 09/16/2019   Procedure: TRANSESOPHAGEAL ECHOCARDIOGRAM (TEE);  Surgeon: Wonda Olds, MD;  Location: [facility];  Service: Open Heart Surgery;  Laterality: N/A;   TONSILLECTOMY     WISDOM TOOTH EXTRACTION      Home Medications:  Allergies as of 05/23/2020      Reactions   Cilostazol Other (See Comments)   REACTION: stomach problems- constipation   Simvastatin Other (See Comments)   REACTION: myalgia      Medication List       Accurate as of May 23, 2020 11:59 PM. If you have any questions, ask your nurse or doctor.        aspirin 81 MG chewable tablet Chew 1 tablet (81 mg total) by mouth daily.   cholecalciferol 1000 units tablet Commonly known as: VITAMIN D Take 1,000 Units by mouth at bedtime.   clopidogrel 75 MG tablet Commonly known as: PLAVIX TAKE ONE TABLET BY MOUTH EVERY DAY *BOTTLE*   Magnesium 400 MG Caps Take by mouth daily.   metoprolol succinate 50 MG 24 hr tablet Commonly known as: TOPROL-XL **VIAL ONLY** TAKE (1) TABLET BY MOUTH ONCE A DAY.DO NOT CRUSH. (HIGHBLOOD PRESSURE) *BOTTLE*   multivitamin with minerals Tabs tablet Take 1 tablet by mouth every morning.   rosuvastatin 10 MG tablet Commonly known as: CRESTOR **VIAL ONLY** TAKE ONE TABLET BY MOUTH AT BEDTIME. (IMPROVES CHOLESTEROL)   sodium chloride inhaler  solution Commonly known as: BRONCHO SALINE Take 1 spray by nebulization daily as needed (congestion).   tamsulosin 0.4 MG Caps capsule Commonly known as: FLOMAX TAKE 1 CAPSULE BY MOUTH ONCE DAILY       Allergies:  Allergies  Allergen Reactions   Cilostazol Other (See Comments)    REACTION: stomach problems- constipation   Simvastatin Other (See Comments)    REACTION: myalgia    Family History: Family History  Problem Relation Age of Onset   Diabetes Father        DM - and brother    Stroke Father    Coronary artery disease Paternal  Grandfather    Colon cancer Neg Hx    Prostate cancer Neg Hx     Social History:  reports that he quit smoking about 27 years ago. His smoking use included cigarettes. He has never used smokeless tobacco. He reports previous alcohol use. He reports that he does not use drugs.   Physical Exam: BP (!) 162/83    Pulse 82    Ht 5\' 10"  (1.778 m)    Wt 198 lb (89.8 kg)    BMI 28.41 kg/m   Constitutional:  Alert and oriented, No acute distress. HEENT: [place] AT, moist mucus membranes.  Trachea midline, no masses. Cardiovascular: No clubbing, cyanosis, or edema. Respiratory: Normal respiratory effort, no increased work of breathing. GI: Abdomen is soft, nontender, nondistended, no abdominal masses GU: Phallus without lesions, testes descended bilaterally without masses or tenderness.  Prostate 50 g, smooth without nodules Lymph: No cervical or inguinal lymphadenopathy. Skin: No rashes, bruises or suspicious lesions. Neurologic: Grossly intact, no focal deficits, moving all 4 extremities. Psychiatric: Normal mood and affect.  Laboratory Data: Lab Results  Component Value Date   WBC 7.6 05/17/2020   HGB 11.2 (L) 05/17/2020   HCT 33.9 (L) 05/17/2020   MCV 119.7 Repeated and verified X2. (H) 05/17/2020   PLT 122.0 (L) 05/17/2020    Lab Results  Component Value Date   CREATININE 1.54 (H) 05/17/2020    Urinalysis Dipstick/microscopy negative   Assessment & Plan:    1. Gross hematuria  AUA risk stratification: High  We discussed potential causes of hematuria including benign etiologies most commonly urinary tract calculi and BPH as well as possibility of malignant tumors on the urinary tract  The standard recommended evaluation for high risk hematuria of CT urogram and cystoscopy was discussed in detail.  Creatinine baseline around 1.5 and will schedule MR urogram and cystoscopy  2.  BPH with LUTS  Stable on tamsulosin   Abbie Sons, MD  [facility] [address], [place], [place] [address] [phone]

## 2020-05-25 LAB — URINALYSIS, COMPLETE
Bilirubin, UA: NEGATIVE
Glucose, UA: NEGATIVE
Ketones, UA: NEGATIVE
Leukocytes,UA: NEGATIVE
Nitrite, UA: NEGATIVE
Protein,UA: NEGATIVE
RBC, UA: NEGATIVE
Specific Gravity, UA: 1.025 (ref 1.005–1.030)
Urobilinogen, Ur: 0.2 mg/dL (ref 0.2–1.0)
pH, UA: 5 (ref 5.0–7.5)

## 2020-05-25 LAB — MICROSCOPIC EXAMINATION: Bacteria, UA: NONE SEEN

## 2020-05-28 DIAGNOSIS — Z951 Presence of aortocoronary bypass graft: Secondary | ICD-10-CM | POA: Diagnosis not present

## 2020-05-28 DIAGNOSIS — I10 Essential (primary) hypertension: Secondary | ICD-10-CM | POA: Diagnosis not present

## 2020-05-28 DIAGNOSIS — I25119 Atherosclerotic heart disease of native coronary artery with unspecified angina pectoris: Secondary | ICD-10-CM | POA: Diagnosis not present

## 2020-05-28 DIAGNOSIS — I739 Peripheral vascular disease, unspecified: Secondary | ICD-10-CM | POA: Diagnosis not present

## 2020-05-28 DIAGNOSIS — E785 Hyperlipidemia, unspecified: Secondary | ICD-10-CM | POA: Diagnosis not present

## 2020-05-28 DIAGNOSIS — I712 Thoracic aortic aneurysm, without rupture: Secondary | ICD-10-CM | POA: Diagnosis not present

## 2020-05-29 ENCOUNTER — Other Ambulatory Visit: Payer: Self-pay

## 2020-05-29 ENCOUNTER — Ambulatory Visit: Payer: Medicare PPO | Admitting: Podiatry

## 2020-05-29 DIAGNOSIS — B351 Tinea unguium: Secondary | ICD-10-CM | POA: Diagnosis not present

## 2020-05-29 DIAGNOSIS — M79676 Pain in unspecified toe(s): Secondary | ICD-10-CM | POA: Diagnosis not present

## 2020-05-29 DIAGNOSIS — L989 Disorder of the skin and subcutaneous tissue, unspecified: Secondary | ICD-10-CM

## 2020-05-29 NOTE — Progress Notes (Signed)
    Subjective: Patient is a 86 y.o. male presenting to the office today for follow up evaluation of painful callus lesion(s) noted to the bilateral feet. Walking and bearing weight increases the pain. He has not had any recent treatment for the symptoms.  Patient also complains of elongated, thickened nails that cause pain while ambulating in shoes. He is unable to trim his own nails. Patient presents today for further treatment and evaluation.  Past Medical History:  Diagnosis Date  . CAD (coronary artery disease)    s/p PCI after presenting with exertional angina (drug eluting stent)  . Diverticulosis   . GERD (gastroesophageal reflux disease)   . HTN (hypertension)   . Hyperlipidemia   . Osteoarthritis   . PVD (peripheral vascular disease) ([place])    s/p aortobifemoral bypass, with bilateral SFA occlusion and intermittent claudication   . Small bowel obstruction ([facility])     Objective:  Physical Exam General: Alert and oriented x3 in no acute distress  Dermatology: Hyperkeratotic lesion(s) present on the bilateral feet. Pain on palpation with a central nucleated core noted. Skin is warm, dry and supple bilateral lower extremities. Negative for open lesions or macerations. Nails are tender, long, thickened and dystrophic with subungual debris, consistent with onychomycosis, 1-5 bilateral. No signs of infection noted.  Vascular: Palpable pedal pulses bilaterally. No edema or erythema noted. Capillary refill within normal limits.  Neurological: Epicritic and protective threshold grossly intact bilaterally.   Musculoskeletal Exam: Pain on palpation at the keratotic lesion(s) noted. Range of motion within normal limits bilateral. Muscle strength 5/5 in all groups bilateral.  Assessment: 1. Onychodystrophic nails 1-5 bilateral with hyperkeratosis of nails.  2. Onychomycosis of nail due to dermatophyte bilateral 3. Pre-ulcerative callus lesions noted to the bilateral feet x 4   Plan of  Care:  1. Patient evaluated. 2. Excisional debridement of keratoic lesion(s) using a chisel blade was performed without incident.  3. Dressed with light dressing. 4. Mechanical debridement of nails 1-5 bilaterally performed using a nail nipper. Filed with dremel without incident.  5. Patient is to return to the clinic in 3 months.   Edrick Kins, DPM [facility]  Dr. Edrick Kins, [place]                                        [place], [place] [address]                Office [phone]  Fax [phone]

## 2020-05-30 ENCOUNTER — Other Ambulatory Visit: Payer: Self-pay | Admitting: Urology

## 2020-05-30 DIAGNOSIS — R310 Gross hematuria: Secondary | ICD-10-CM

## 2020-06-15 ENCOUNTER — Ambulatory Visit
Admission: RE | Admit: 2020-06-15 | Discharge: 2020-06-15 | Disposition: A | Discharge status: Home / Self Care | Payer: Medicare PPO | Source: Ambulatory Visit | Attending: Urology | Admitting: Urology

## 2020-06-15 ENCOUNTER — Other Ambulatory Visit: Payer: Self-pay

## 2020-06-15 DIAGNOSIS — N281 Cyst of kidney, acquired: Secondary | ICD-10-CM | POA: Diagnosis not present

## 2020-06-15 DIAGNOSIS — R310 Gross hematuria: Secondary | ICD-10-CM | POA: Insufficient documentation

## 2020-06-15 DIAGNOSIS — I700 Atherosclerosis of aorta: Secondary | ICD-10-CM | POA: Diagnosis not present

## 2020-06-15 DIAGNOSIS — K7689 Other specified diseases of liver: Secondary | ICD-10-CM | POA: Diagnosis not present

## 2020-06-15 DIAGNOSIS — K4090 Unilateral inguinal hernia, without obstruction or gangrene, not specified as recurrent: Secondary | ICD-10-CM | POA: Diagnosis not present

## 2020-06-15 MED ORDER — IOHEXOL 300 MG/ML  SOLN
100.0000 mL | Freq: Once | INTRAMUSCULAR | Status: AC | PRN
  Administered 2020-06-15: 100 mL via INTRAVENOUS

## 2020-06-19 ENCOUNTER — Ambulatory Visit (HOSPITAL_COMMUNITY): Payer: Medicare PPO

## 2020-06-22 ENCOUNTER — Encounter (HOSPITAL_COMMUNITY): Payer: Self-pay

## 2020-06-22 ENCOUNTER — Ambulatory Visit (HOSPITAL_COMMUNITY): Payer: Medicare PPO

## 2020-06-22 ENCOUNTER — Ambulatory Visit (HOSPITAL_COMMUNITY): Admission: RE | Admit: 2020-06-22 | Payer: Medicare PPO | Source: Ambulatory Visit

## 2020-07-04 ENCOUNTER — Other Ambulatory Visit: Payer: Self-pay

## 2020-07-04 ENCOUNTER — Ambulatory Visit: Payer: Medicare PPO | Admitting: Urology

## 2020-07-04 ENCOUNTER — Encounter: Payer: Self-pay | Admitting: Urology

## 2020-07-04 ENCOUNTER — Other Ambulatory Visit: Payer: Self-pay | Admitting: Radiology

## 2020-07-04 VITALS — BP 168/84 | HR 84 | Ht 70.0 in | Wt 195.0 lb

## 2020-07-04 DIAGNOSIS — D494 Neoplasm of unspecified behavior of bladder: Secondary | ICD-10-CM

## 2020-07-04 DIAGNOSIS — R310 Gross hematuria: Secondary | ICD-10-CM

## 2020-07-04 MED ORDER — GEMCITABINE CHEMO FOR BLADDER INSTILLATION 2000 MG: 2000.0000 mg | Freq: Once | INTRAVENOUS | Status: DC

## 2020-07-04 NOTE — Progress Notes (Signed)
   07/04/20  CC:  Chief Complaint  Patient presents with  . Cysto    Indications: Gross hematuria  HPI: Recurrent episode gross hematuria since last visit  Blood pressure (!) 168/84, pulse 84, height 5\' 10"  (1.778 m), weight 195 lb (88.5 kg). NED. A&Ox3.   No respiratory distress   Abd soft, NT, ND Normal phallus with bilateral descended testicles  CT urogram: Images were personally reviewed.  3 cm proteinaceous left renal cyst; 14 mm left lower pole cyst; enhancing bladder lesion noted posterior wall  Cystoscopy Procedure Note  Patient identification was confirmed, informed consent was obtained, and patient was prepped using Betadine solution.  Lidocaine jelly was administered per urethral meatus.     Pre-Procedure: - Inspection reveals a normal caliber urethral meatus.  Procedure: The flexible cystoscope was introduced without difficulty - No urethral strictures/lesions are present. 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 Mally and his son and that this most likely represents a superficial urothelial carcinoma  TURBT recommended. The indications and nature of the planned procedure were discussed as well as the potential benefits and expected outcome.  Alternatives have been discussed.  Potential complications were discussed including but not limited to bleeding, infection and bladder perforation as well as anesthetic risks. The postoperative care and followup was reviewed.  The patient was informed that he may need  additional treatment along with periodic surveillance cystoscopy.  All of his questions were answered and he desires to proceed.  Preop cardiology evaluation regarding Plavix   Abbie Sons, MD

## 2020-07-04 NOTE — H&P (View-Only) (Signed)
   07/04/20  CC:  Chief Complaint  Patient presents with  . Cysto    Indications: Gross hematuria  HPI: Recurrent episode gross hematuria since last visit  Blood pressure (!) 168/84, pulse 84, height 5\' 10"  (1.778 m), weight 195 lb (88.5 kg). NED. A&Ox3.   No respiratory distress   Abd soft, NT, ND Normal phallus with bilateral descended testicles  CT urogram: Images were personally reviewed.  3 cm proteinaceous left renal cyst; 14 mm left lower pole cyst; enhancing bladder lesion noted posterior wall  Cystoscopy Procedure Note  Patient identification was confirmed, informed consent was obtained, and patient was prepped using Betadine solution.  Lidocaine jelly was administered per urethral meatus.     Pre-Procedure: - Inspection reveals a normal caliber urethral meatus.  Procedure: The flexible cystoscope was introduced without difficulty - No urethral strictures/lesions are present. 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 Wirick and his son and that this most likely represents a superficial urothelial carcinoma  TURBT recommended. The indications and nature of the planned procedure were discussed as well as the potential benefits and expected outcome.  Alternatives have been discussed.  Potential complications were discussed including but not limited to bleeding, infection and bladder perforation as well as anesthetic risks. The postoperative care and followup was reviewed.  The patient was informed that he may need  additional treatment along with periodic surveillance cystoscopy.  All of his questions were answered and he desires to proceed.  Preop cardiology evaluation regarding Plavix   Abbie Sons, MD

## 2020-07-05 ENCOUNTER — Encounter: Payer: Self-pay | Admitting: Urology

## 2020-07-05 LAB — URINALYSIS, COMPLETE
Bilirubin, UA: NEGATIVE
Glucose, UA: NEGATIVE
Ketones, UA: NEGATIVE
Leukocytes,UA: NEGATIVE
Nitrite, UA: NEGATIVE
Protein,UA: NEGATIVE
Specific Gravity, UA: 1.01 (ref 1.005–1.030)
Urobilinogen, Ur: 0.2 mg/dL (ref 0.2–1.0)
pH, UA: 6.5 (ref 5.0–7.5)

## 2020-07-05 LAB — MICROSCOPIC EXAMINATION: Bacteria, UA: NONE SEEN

## 2020-07-12 ENCOUNTER — Telehealth: Payer: Self-pay | Admitting: Radiology

## 2020-07-12 LAB — CULTURE, URINE COMPREHENSIVE

## 2020-07-12 NOTE — Telephone Encounter (Signed)
LMOM notifying patient to hold aspirin and Plavix beginning 07/17/2020.

## 2020-07-13 ENCOUNTER — Telehealth: Payer: Self-pay | Admitting: Radiology

## 2020-07-13 ENCOUNTER — Inpatient Hospital Stay: Admission: RE | Admit: 2020-07-13 | Payer: Medicare PPO | Source: Ambulatory Visit

## 2020-07-13 DIAGNOSIS — D494 Neoplasm of unspecified behavior of bladder: Secondary | ICD-10-CM

## 2020-07-13 MED ORDER — SULFAMETHOXAZOLE-TRIMETHOPRIM 800-160 MG PO TABS: 1.0000 | ORAL_TABLET | Freq: Two times a day (BID) | ORAL | 0 refills | Status: DC

## 2020-07-13 NOTE — Telephone Encounter (Signed)
Patient reports burning and discomfort when urinating. Please note urine culture collected on 07/04/2020.

## 2020-07-13 NOTE — Telephone Encounter (Signed)
Per Dr Bernardo Heater, Insignificant growth but since he is having symptoms would start Septra DS 1 twice daily for 7 days  Patient aware of script sent to pharmacy.

## 2020-07-16 ENCOUNTER — Other Ambulatory Visit: Payer: Self-pay

## 2020-07-16 ENCOUNTER — Other Ambulatory Visit: Payer: Medicare PPO

## 2020-07-16 DIAGNOSIS — Z01818 Encounter for other preprocedural examination: Secondary | ICD-10-CM

## 2020-07-16 DIAGNOSIS — R310 Gross hematuria: Secondary | ICD-10-CM | POA: Diagnosis not present

## 2020-07-17 LAB — MICROSCOPIC EXAMINATION

## 2020-07-17 LAB — URINALYSIS, COMPLETE
Bilirubin, UA: NEGATIVE
Glucose, UA: NEGATIVE
Ketones, UA: NEGATIVE
Nitrite, UA: NEGATIVE
Protein,UA: NEGATIVE
Specific Gravity, UA: 1.02 (ref 1.005–1.030)
Urobilinogen, Ur: 0.2 mg/dL (ref 0.2–1.0)
pH, UA: 5.5 (ref 5.0–7.5)

## 2020-07-18 ENCOUNTER — Other Ambulatory Visit: Payer: Medicare PPO

## 2020-07-19 ENCOUNTER — Encounter
Admission: RE | Admit: 2020-07-19 | Discharge: 2020-07-19 | Disposition: A | Discharge status: Home / Self Care | Payer: Medicare PPO | Source: Ambulatory Visit | Attending: Urology | Admitting: Urology

## 2020-07-19 LAB — CULTURE, URINE COMPREHENSIVE

## 2020-07-19 NOTE — Pre-Procedure Instructions (Signed)
[facility]  Encounter Summary     Brandon Lewis - 86 y.o. Male; born [birth year]Encounter Summary, generated on Aug. 25, 2021August 25, 2021  Reason for Visit  Reason Comments  Hypertension   Coronary Artery Disease FU ETT  Encounter Details  Date Type Department Care Team Description  05/28/2020 Office Visit [facility]  Brandon Lewis, Brandon Lewis [address]  [phone]  Flossie Dibble, [place]  Brandon Lewis  [place], [place] [address]  [phone]  [phone] ([address]    Wendee Beavers, NP  [address]  [place], [place] [address]  [phone]  [phone] (Fax)  Brandon Lewis (CMS-HCC) (Primary Dx);  Essential hypertension, benign;  Coronary artery disease involving native coronary artery of native heart with angina pectoris (CMS-HCC);  Peripheral arterial disease (CMS-HCC);  Hyperlipidemia, unspecified hyperlipidemia type;  S/P CABG x 4  Social History - documented as of this encounter Tobacco Use Types Packs/Day Years Used Date  Former Smoker Cigarettes     Smokeless Tobacco: Never Used      Sex Assigned at [employer] Date Recorded  Not on file    COVID-19 Exposure Response Date Recorded  In the last month, have you been in contact with someone who was confirmed or suspected to have Brandon Lewis / COVID-19? No / Unsure 05/28/2020 1:55 PM EDT  Last Filed Vital Signs - documented in this encounter Vital Sign Reading Time Taken Comments  Blood Pressure 132/78 05/28/2020 2:04 PM EDT   Pulse 86 05/28/2020 2:04 PM EDT   Temperature - -   Respiratory Rate - -   Oxygen Saturation 99% 05/28/2020 2:04 PM EDT   Inhaled Oxygen Concentration - -   Weight 89 kg (196 lb 3.4 oz) 05/28/2020 2:04 PM EDT   Height 177.8 cm (5\' 10" ) 05/28/2020 2:04 PM EDT   Body Mass Index 28.15 05/28/2020 2:04 PM EDT   Progress  Notes - documented in this encounter Wendee Beavers, NP - 05/28/2020 2:15 PM EDT Formatting of this note is different from the original. Established Patient Visit   Chief Complaint: Chief Complaint  Patient presents with  . Hypertension  . Coronary Artery Disease  FU ETT  Date of Service: 05/28/2020 Date of Birth: 10-29-1933 PCP: Brandon Carbon, MD  History of Present Illness: [name] is a 86 y.o.male patient with PMH significant for benign essential hypertension, coronary artery disease (s/p CABG x4 at [facility] on 09/16/2019 with a LIMA to the LAD, saphenous to the RCA, OM1, and D1), peripheral arterial disease (s/p femoral artery bypass grafting in 2003, chronic renal disease stage III, hyperlipidemia, and a-flutter postoperatively who presents today for follow up after recent echocardiogram.  Today in office he appears stable from a cardiovascular standpoint. He does explain that he feels like he is lacking stamina but this is unchanged since before the surgery back in November. He denies chest pain at rest or with exertion. He denies shortness of breath with activity or lying down. He finds that his claudication symptoms are perhaps the most limiting factor in his activity but this appears to be stable at this time. He denies bilateral lower extremity edema. He denies palpitations. He denies lightheadedness/dizziness and syncope/near syncope. He explains that he was recently noted to have blood in his urine; he has since seen Dr. Bernardo Lewis in urology; he is currently undergoing work-up for this at this time. Dr. Bernardo Lewis office recommended CT/MR  urogram and cystoscopy; he is currently scheduled to undergo MR abdomen with without contrast, and MR pelvis with without contrast. After finishing with cardiac rehab he continues to use a Nu-Step elliptical device 3 times per week for exercise. From a dietary standpoint he eats healthy foods and controls his portion sizes. He is taking his  medications as prescribed. He does not take his blood pressure measurement at home buts it appears to be well controlled at previous medical encounters and his blood pressure in office today is 132/78.  His primary care provider in [place] MD; last seen in his office on 05/17/2020  On 11/30/2019 it was noted that he experienced a-flutter during the postoperative period following his bypass surgery. It was noted that the a-flutter he experienced was likely related to a reversible cause given his recent cardiac surgery and the discontinuation of amiodarone was a possible consideration; He was eventually advised to stop taking the amiodarone on 02/02/2020 per Dr. Nehemiah Lewis.  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  Most recent lipid panel on 05/17/2020 showed a total cholesterol of 137, triglycerides 126, HDL 30, LDL 82.  Most recent CMP from 05/17/2020 showed a sodium of 139, potassium 4.4, BUN 24, serum creatinine 1.54, GFR 43.24  Most recent CT chest with out contrast on 09/14/2019: IMPRESSION:  1. 4.1 cm ascending Brandon aortic Lewis. Recommend annual  imaging followup by CTA or MRA. This recommendation follows 2010  ACCF/AHA/AATS/ACR/ASA/SCA/SCAI/SIR/STS/SVM Guidelines for the  Diagnosis and Management of Patients with Brandon Aortic Disease.  Circulation. 2010; 121: [id number]. Aortic Lewis NOS (ICD10-I71.9)   He is therefore due for repeat imaging at or around 09/13/2020- ------------------------------------------------------------------------------------------------------ Recent echocardiogram on 11/24/2019 INTERPRETATION NORMAL LEFT VENTRICULAR SYSTOLIC FUNCTION WITH MILD LVH NORMAL RIGHT VENTRICULAR SYSTOLIC FUNCTION MILD VALVULAR REGURGITATION (See above) NO VALVULAR STENOSIS MILD TR, PR TRIVIAL MR EF 55%  Consider repeat echocardiogram around 11/23/2022 or as symptoms dictate.  Most recent exercise stress test from 04/30/2020: Reason for Exercise  Test: CP Exercise Test Summary Phase Stage Time Speed Grade HR BP Comment Name Name in Stage (mph) (%) (bpm) (mmHg) PRETEST SUPINE 03:05 0.0 0.0 88 142/86 Exercise STAGE 1 02:02 1.7 10.0 115 142/86 Manual STAGE 2 00:06 1.3 10.0 115 Recovery Recovery1 01:00 0.0 0.0 100 142/86 Recovery2 01:00 0.0 0.0 90 Recovery3 01:00 0.0 0.0 86 200/84 Recovery4 01:00 0.0 0.0 81 200/84 Recovery5 00:21 0.0 0.0 83 200/84 The patient was stressed according to the BRUCE protocol for 02:06 min:s, achieving a work level of Max. METS: 4.6. The resting heart rate of 80 bpm rose to a maximal heart rate of 115 bpm. This value represents 84 % of the maximal, age-predicted heart rate. The resting blood pressure of 142/86 mmHg , rose to a maximum blood pressure of 200/84 mmHg. The exercise test was stopped due to Severe claudication. Summary: Resting ECG: Normal. Functional Capacity: Slightly Decreased (20% to 30%). HR Response to Exercise: Normal Overall HR Response To Exercise. BP Response to Exercise: Normal Resting BP with Appropriate Response. Chest Pain: No Chest Pain. Arrhythmias: No Arrhythmias. ST Changes: none. Conclusions Normal treadmill ECG without evidence of ischemia or arrhythmia.  Past Medical and Surgical History  Past Medical History No past medical history on file.  Past Surgical History He has no past surgical history on file.   Medications and Allergies  Current Medications  Current Outpatient Medications  Medication Sig Dispense Refill  . aspirin 81 MG chewable tablet Take by mouth  . clopidogreL (  PLAVIX) 75 mg tablet  . magnesium oxide,aspartate,citr (TRIPLE MAGNESIUM COMPLEX) 400 mg magnesium Cap Take by mouth  . metoprolol succinate (TOPROL-XL) 50 MG XL tablet **VIAL ONLY** TAKE (1) TABLET BY MOUTH ONCE A DAY.DO NOT CRUSH. (HIGHBLOOD PRESSURE) *BOTTLE*  . mometasone-formoterol (DULERA) 100-5 mcg/actuation inhaler Inhale into the lungs  . rosuvastatin (CRESTOR) 10 MG tablet  .  tamsulosin (FLOMAX) 0.4 mg capsule   No current facility-administered medications for this visit.   Allergies: Cilostazol and Simvastatin  Social and Family History  Social History reports that he has quit smoking. His smoking use included cigarettes. He has never used smokeless tobacco.  Family History No family history on file.  Review of Systems   Review of Systems: The patient denies chest pain, shortness of breath, orthopnea, paroxysmal nocturnal dyspnea, pedal edema, palpitations, heart racing, presyncope, syncope. Review of 12 Systems is negative except as described above in HPI.  Physical Examination   Vitals:BP 132/78  Pulse 86  Ht 177.8 cm (5\' 10" )  Wt 89 kg (196 lb 3.4 oz)  SpO2 99%  BMI 28.15 kg/m  Ht:177.8 cm (5\' 10" ) Wt:89 kg (196 lb 3.4 oz) DQQ:IWLN surface area is 2.1 meters squared. Body mass index is 28.15 kg/m.  General: Alert and oriented. Well-appearing. No acute distress. HEENT: Pupils equally reactive to light and accomodation  Neck: Supple, carotid pulses 2+. No JVD Lungs: Normal effort of breathing; clear to auscultation bilaterally; no wheezes, rales, rhonchi or crackles. Chest expansion symmetrical Heart: Regular rate and rhythm. No murmur, rub, or gallop Abdomen: soft nontender, nondistended, with normal bowel sounds Extremities: no edema Peripheral Pulses: 2+ radial bilaterally Skin: Warm, dry, no diaphoresis, color appropriate for ethnicity  Assessment   86 y.o. male with  1. Brandon Lewis (CMS-HCC)  2. Essential hypertension, benign  3. Coronary artery disease involving native coronary artery of native heart with angina pectoris (CMS-HCC)  4. Peripheral arterial disease (CMS-HCC)  5. Hyperlipidemia, unspecified hyperlipidemia type  6. S/P CABG x 4   Plan   1. Benign essential hypertension. Blood pressure in office today 132/78 and appears to be well controlled at previous medical encounters. Most recent CMP from  05/17/2020 showed a sodium of 139, potassium 4.4, BUN 24, serum creatinine 1.54, GFR 43.24- Advised patient to consume a low-sodium diet and obtain adequate exercise. Patient is not currently taking losartan as he explains that this was discontinued at earlier time. However he continues to take metoprolol succinate. At some point he needs additional antihypertensive therapy consider beginning losartan at low-dose. We will continue to monitor his blood pressure closely at subsequent visits and follow-up 3 months from now. I also instructed the patient to avoid NSAIDs. For now we will continue his current medication regimen.  2. Coronary artery disease (s/p CABG x4 at [facility] on 09/16/2019 with a LIMA to the LAD, saphenous to the RCA, OM1, and D1). Patient is taking aspirin and rosuvastatin for secondary prevention of ASCVD and he remains on DAPT with aspirin and Plavix. Patient without complaints of chest pain. Patient without complaints of shortness of breath at this visit as well. Advised the patient to consume a diet low in cholesterol and saturated fat and continue to obtain adequate exercise aiming for a total of 30 minutes 5 days/week. Patient recently finished cardiac rehab in late May and is very happy with the progress that he made during that time and he feels like it really helped. Exercise stress test on 04/30/2020 was reassuring from a cardiovascular  standpoint demonstrating normal stress test without evidence of ischemia or arrhythmia. However he was only able to go to minutes and 6 seconds on a Bruce protocol achieving a METS of 4.6; Stress test was stopped due to symptoms of claudication. We will continue his current medication regimen.  3. Peripheral arterial disease. Patient continues to experience claudication symptoms related to peripheral arterial disease. Patient is currently taking aspirin and clopidogrel as well as rosuvastatin. advised patient to consume a diet low in saturated fat and  cholesterol. At this point it seems as of claudication this is main limiting factor. Consider performing ABIs at next visit in 3 months. Also plan to discuss with patient the Compass trial shows the benefits of aspirin with 2.5 mg of Xarelto twice daily in the treatment of peripheral vascular disease. Also consider surveillance/obtaining carotid ultrasound. For now we will continue current medication regimen.  4. Hyperlipidemia. Most recent lipid panel on 05/17/2020 showed a total cholesterol of 137, triglycerides 126, HDL 30, LDL 82. Advised patient to consume a diet low in cholesterol and saturated fat and continue to obtain adequate exercise aiming for 30 minutes 5 days a week. Patient is taking rosuvastatin Crestor 10 mg. Patient appears to be tolerating this regimen well. Consider intensification of statin therapy at next visit. Continue current statin regimen.  5. Atrial flutter. Patient with atrial flutter during the post-operative period following his bypass surgery. He was on amiodarone for rhythm control post-operatively. The atrial flutter he experienced was likely related to a reversible cause given recent cardiac surgery. Amiodarone was subsequently discontinued back in April 2021. It appears that he is not on anti-coagulation because the a-flutter appears to be an isolated event. Previous ECG at last visit on 04/05/2020 demonstrates normal sinus rhythm with a ventricular rate of 83 bpm, PR interval of 154 MS, QT/QTc of 376/441 MS. Patient denies palpitations, heart racing/skipping beats or any other symptoms indicative of paroxysms of atrial fibrillation/atrial flutter.  6. Brandon Lewis-  Most recent CT chest with out contrast on 09/14/2019: IMPRESSION:  1. 4.1 cm ascending Brandon aortic Lewis. Recommend annual  imaging followup by CTA or MRA. This recommendation follows 2010  ACCF/AHA/AATS/ACR/ASA/SCA/SCAI/SIR/STS/SVM Guidelines for the  Diagnosis and Management  of Patients with Brandon Aortic Disease.  Circulation. 2010; 121: F818-E993. Aortic Lewis NOS (ICD10-I71.9)   He is therefore due for repeat imaging at or around 09/13/2020-at next visit review most recent MRIs and see if there are any notations about the size and the status of his ascending Brandon aortic Lewis. If not, plan to order imaging in order to evaluate.  Return in about 3 months (around 08/28/2020).  I personally performed the service, non-incident to. (WP)   NATHAN Raymon Mutton, NP   Wendee Beavers, NP   This dictation was prepared with dragon dictation. Any transcription errors that result from this process are unintentional.    Electronically signed by Wendee Beavers, NP at 05/29/2020 12:00 PM EDT  Plan of Treatment - documented as of this encounter Upcoming Encounters Upcoming Encounters  Date Type Specialty Care Team Description  08/29/2020 Office Visit Cardiology Flossie Dibble, MD  [address]  [facility]  [place], [place] [address]  [phone]  [phone] ([address])    Wendee Beavers, NP  [address]  [place], [place] [address]  [phone]  [phone] (Fax)    Visit Diagnoses - documented in this encounter Diagnosis  Brandon Lewis (CMS-HCC) - Primary   Essential hypertension,  benign   Coronary artery disease involving native coronary artery of native heart with angina pectoris (CMS-HCC)   Peripheral arterial disease (CMS-HCC)  Unspecified peripheral vascular disease   Hyperlipidemia, unspecified hyperlipidemia type   S/P CABG x 4  Postsurgical aortocoronary bypass status   Discontinued Medications - documented as of this encounter Medication Sig Discontinue Reason Start Date End Date  AMIOdarone (PACERONE) 200 MG tablet   Therapy completed 10/02/2019 05/28/2020  losartan (COZAAR) 100 MG tablet   Therapy completed 08/16/2019 05/28/2020  Images Patient  Demographics  Patient Address Communication Language Race / Ethnicity Marital Status  [address] [place], [place] [address] [phone] (Home) [phone] (Mobile) [email or web address] English (Preferred) White / Not Hispanic or Latino Married  Patient Contacts  Contact Name Contact Address Communication Relationship to Patient  Dandra Velardi Unknown [phone] [facility] [employer] [facility]) Son or Daughter, Emergency Contact  Document Information  Primary Care Provider Other Service Providers Document Coverage Dates  Brandon Carbon, MD (Jan. 15, 2021January 15, 2021 - Present) [phone] (Work) [phone] (Fax) [place], [place] [address] Family Medicine    Aug. 09, 2021August 09, 2021   [place] [address] [place], [place] [address]   Encounter Providers Encounter Date  Wendee Beavers, NP (Attending) [phone] (Work) [phone] (Fax) [address] [place], [place] [address] Cardiovascular Disease Aug. 09, 2021August 09, 2021    Show All Sections

## 2020-07-19 NOTE — Patient Instructions (Addendum)
Your procedure is scheduled on: 07-24-2020 Report to Day Surgery on the 2nd floor of the [facility]. To find out your arrival time, please call [phone] between 1PM - 3PM on: 07-23-2020  REMEMBER: Instructions that are not followed completely may result in serious medical risk, up to and including death; or upon the discretion of your surgeon and anesthesiologist your surgery may need to be rescheduled.  Do not eat food after midnight the night before surgery.  No gum chewing, lozengers or hard candies.    Clear liquids include: - water  - apple juice without pulp - gatorade (not RED) - black coffee or tea (Do NOT add milk or creamers to the coffee or tea) Do NOT drink anything that is not on this list.      TAKE THESE MEDICATIONS THE MORNING OF SURGERY WITH A SIP OF WATER:   Metoprolol Succinate ( Toprol XL) 50 mg  Rosuvastatin ( Crestor ) Flomax ( Tamsulosin) Pantoprazole ( Protonix) take one tablet the night before and one on the morning of surgery - helps to prevent nausea after surgery.   Follow recommendations from Cardiologist, Pulmonologist or PCP regarding stopping Aspirin and Plavix , Patient stopped on 07-17-2020 per MD instructions.  One week prior to surgery: Stop Anti-inflammatories (NSAIDS) such as Advil, Aleve, Ibuprofen, Motrin, Naproxen, Naprosyn and Aspirin based products such as Excedrin, Goodys Powder, BC Powder. Stop ANY OVER THE COUNTER supplements until after surgery. (You may continue taking Tylenol, Vitamin D, Vitamin B, and multivitamin.)  No Alcohol for 24 hours before or after surgery.  No Smoking including e-cigarettes for 24 hours prior to surgery.  No chewable tobacco products for at least 6 hours prior to surgery.  No nicotine patches on the day of surgery.  Do not use any "recreational" drugs for at least a week prior to your surgery.  Please be advised that the combination of cocaine and anesthesia may have negative outcomes, up  to and including death. If you test positive for cocaine, your surgery will be cancelled.  On the morning of surgery brush your teeth with toothpaste and water, you may rinse your mouth with mouthwash if you wish. Do not swallow any toothpaste or mouthwash.  Do not wear jewelry, make-up, hairpins, clips or nail polish.  Do not wear lotions, powders, or perfumes.   Do not shave 48 hours prior to surgery.   Contact lenses, hearing aids and dentures may not be worn into surgery.  Do not bring valuables to the hospital. [facility] is not responsible for any missing/lost belongings or valuables.   Notify your doctor if there is any change in your medical condition (cold, fever, infection).  Wear comfortable clothing (specific to your surgery type) to the hospital.  Plan for stool softeners for home use; pain medications have a tendency to cause constipation. You can also help prevent constipation by eating foods high in fiber such as fruits and vegetables and drinking plenty of fluids as your diet allows.  After surgery, you can help prevent lung complications by doing breathing exercises.  Take deep breaths and cough every 1-2 hours. Your doctor may order a device called an Incentive Spirometer to help you take deep breaths. When coughing or sneezing, hold a pillow firmly against your incision with both hands. This is called "splinting." Doing this helps protect your incision. It also decreases belly discomfort.  If you are being admitted to the hospital overnight, leave your suitcase in the car. After surgery it may  be brought to your room.  If you are being discharged the day of surgery, you will not be allowed to drive home. You will need a responsible adult (18 years or older) to drive you home and stay with you that night.   If you are taking public transportation, you will need to have a responsible adult (18 years or older) with you. Please confirm with your physician that it is  acceptable to use public transportation.   Please call the [facility]. at [phone] if you have any questions about these instructions.  Visitation Policy:  Patients undergoing a surgery or procedure may have one family member or support person with them as long as that person is not COVID-19 positive or experiencing its symptoms.  That person may remain in the waiting area during the procedure.  Inpatient Visitation Update:   In an effort to ensure the safety of our team members and our patients, we are implementing a change to our visitation policy:  Effective Monday, Aug. 9, at 7 a.m., inpatients will be allowed one support person.  o The support person may change daily.  o The support person must pass our screening, gel in and out, and wear a mask at all times, including in the patient's room.  o Patients must also wear a mask when staff or their support person are in the room.  o Masking is required regardless of vaccination status.  Systemwide, no visitors 17 or younger.

## 2020-07-20 ENCOUNTER — Other Ambulatory Visit: Payer: Self-pay | Admitting: *Deleted

## 2020-07-20 ENCOUNTER — Other Ambulatory Visit
Admission: RE | Admit: 2020-07-20 | Discharge: 2020-07-20 | Disposition: A | Discharge status: Home / Self Care | Payer: Medicare PPO | Source: Ambulatory Visit | Attending: Urology | Admitting: Urology

## 2020-07-20 ENCOUNTER — Telehealth: Payer: Self-pay | Admitting: *Deleted

## 2020-07-20 ENCOUNTER — Other Ambulatory Visit: Payer: Self-pay

## 2020-07-20 DIAGNOSIS — Z20822 Contact with and (suspected) exposure to covid-19: Secondary | ICD-10-CM | POA: Diagnosis not present

## 2020-07-20 DIAGNOSIS — Z01812 Encounter for preprocedural laboratory examination: Secondary | ICD-10-CM | POA: Insufficient documentation

## 2020-07-20 LAB — CBC
HCT: 31.8 % — ABNORMAL LOW (ref 39.0–52.0)
Hemoglobin: 9.8 g/dL — ABNORMAL LOW (ref 13.0–17.0)
MCH: 38.1 pg — ABNORMAL HIGH (ref 26.0–34.0)
MCHC: 30.8 g/dL (ref 30.0–36.0)
MCV: 123.7 fL — ABNORMAL HIGH (ref 80.0–100.0)
Platelets: 136 10*3/uL — ABNORMAL LOW (ref 150–400)
RBC: 2.57 MIL/uL — ABNORMAL LOW (ref 4.22–5.81)
RDW: 16.9 % — ABNORMAL HIGH (ref 11.5–15.5)
WBC: 7.1 10*3/uL (ref 4.0–10.5)
nRBC: 0 % (ref 0.0–0.2)

## 2020-07-20 MED ORDER — CIPROFLOXACIN HCL 250 MG PO TABS: 250.0000 mg | ORAL_TABLET | Freq: Two times a day (BID) | ORAL | 0 refills | Status: AC

## 2020-07-20 MED ORDER — CIPROFLOXACIN HCL 250 MG PO TABS: 250.0000 mg | ORAL_TABLET | Freq: Two times a day (BID) | ORAL | 0 refills | Status: DC

## 2020-07-20 NOTE — Progress Notes (Signed)
[facility] Perioperative Services  Pre-Admission/Anesthesia Testing Clinical Review  Date: 07/20/20  Patient Demographics:  Name: Brandon Lewis DOB:   06-01-1934 MRN:   [id number]  Planned Surgical Procedure(s):    Case: [id number] Date/Time: 07/24/20 0850   Procedure: TRANSURETHRAL RESECTION OF BLADDER TUMOR WITH Gemcitabine (N/A )   Anesthesia type: Choice   Pre-op diagnosis: bladder tumor   Location: [facility] OR ROOM 10 / [place] ORS FOR ANESTHESIA GROUP   Surgeons: Abbie Sons, MD     NOTE: Available PAT nursing documentation and vital signs have been reviewed. Clinical nursing staff has updated patient's PMH/PSHx, current medication list, and drug allergies/intolerances to ensure comprehensive history available to assist in medical decision making as it pertains to the aforementioned surgical procedure and anticipated anesthetic course.   Clinical Discussion:  Brandon Lewis is a 86 y.o. male who is submitted for pre-surgical anesthesia review and clearance prior to him undergoing the above procedure. Patient is a Former Smoker (20 pack years; quit 10/1992). Pertinent PMH includes: A flutter, aortic atherosclerosis, thoracic aortic aneurysm, CAD (s/p PCI with DES placement), MI (2020; s/p CABG x4), PAD (s/p femoral artery bypass graft in 2003), HTN, HLD, CKD-III, OA.  Patient is followed by cardiology [name], MD). He was last seen in the cardiology clinic on 05/28/2020; notes reviewed.  At the time of his clinic visit, patient doing well from a cardiovascular standpoint.  He denied any chest pain, shortness of breath, orthopnea, PND, palpitations, peripheral edema, vertiginous symptoms, or presyncope/syncope.  He does complain of some claudication symptoms that limit his activity, however functional capacity documented being >4 METS.  Patient presented to the ED at [facility] on 09/11/2019 with complaints of retrosternal chest pain.  Troponins were positive.  TTE  done on 09/11/2019 revealed normal LV systolic function; LVEF 50 to 55%.  Subsequent heart catheterization on 09/12/2019 revealed severe LAD and RCA stenosis.  Patient was transferred from [facility] to [facility] for further evaluation and treatment.  Patient underwent CABG x4 at [facility] on 09/16/2019 with a LIMA to the LAD, saphenous to the RCA, OM1, and D1.  Post CABG TEE on 09/16/2019 revealed normal LV and RV systolic function; LVEF greater than 55%.  Patient doing well overall following his cardiac bypass.  Repeat TTE done on 11/24/2019 remains unchanged from previous.  Exercise stress test done on 04/30/2020 revealed no evidence of ischemia or arrhythmia, however testing had to be truncated (after 2:06) due to severe claudication symptoms (see full interpretation from cardiovascular testing below).  Hypertension reasonably controlled on beta-blocker monotherapy.  Patient on statin for his HLD.  Patient currently on DAPT (ASA + clopidogrel) for ASCVD prevention.  Thoracic aortic aneurysm stable on last CT imaging of the chest; measured 4.1 cm.  Patient scheduled to undergo a TURBT with intravesical gemcitabine on 07/24/2020 with Dr. John Giovanni.  Due to patient's significant cardiac history, presurgical cardiac clearance has been sought by the attending surgeon.  Per cardiology, patient is optimized for surgery and may proceed with a low risk stratification.  As previously mentioned patient on DAPT therapy.  He has been instructed on recommendations from his cardiologist regarding need to hold his daily low-dose ASA and clopidogrel for 7 days prior to his upcoming procedure.  The patient is aware that his last dose of both his aspirin and comparable will be on 07/17/2020.  He denies previous perioperative complications with anesthesia. He underwent a general anesthetic course here (ASA IV) in 08/2019 with no documented  complications.   Vitals with BMI 07/04/2020 05/23/2020 05/17/2020  Height 5\' 10"  5\' 10"  5'  10"  Weight 195 lbs 198 lbs 198 lbs  BMI 27.98 16.10 96.04  Systolic 540 981 191  Diastolic 84 83 74  Pulse 84 82 73    Providers/Specialists:   NOTE: Primary physician provider listed below. Patient may have been seen by APP or partner within same practice.   PROVIDER ROLE LAST Claud Kelp, MD Urology (Surgeon) 07/04/2020  Venia Carbon, MD Primary Care Provider 05/17/2020  Serafina Royals, MD Cardiology 05/28/2020   Allergies:  Cilostazol and Simvastatin  Current Home Medications:   . gemcitabine (GEMZAR) chemo syringe for bladder instillation 2,000 mg   . aspirin 81 MG chewable tablet  . cholecalciferol (VITAMIN D) 1000 UNITS tablet  . clopidogrel (PLAVIX) 75 MG tablet  . Magnesium 400 MG CAPS  . metoprolol succinate (TOPROL-XL) 50 MG 24 hr tablet  . Multiple Vitamin (MULTIVITAMIN WITH MINERALS) TABS tablet  . rosuvastatin (CRESTOR) 10 MG tablet  . tamsulosin (FLOMAX) 0.4 MG CAPS capsule  . ciprofloxacin (CIPRO) 250 MG tablet  . sodium chloride (BRONCHO SALINE) inhaler solution  . sulfamethoxazole-trimethoprim (BACTRIM DS) 800-160 MG tablet   History:   Past Medical History:  Diagnosis Date  . CAD (coronary artery disease)    s/p PCI after presenting with exertional angina (drug eluting stent)  . Diverticulosis   . GERD (gastroesophageal reflux disease)   . HTN (hypertension)   . Hyperlipidemia   . Myocardial infarction ([place]) 09/2019   bypass and stent placed  . Osteoarthritis   . PVD (peripheral vascular disease) ([place])    s/p aortobifemoral bypass, with bilateral SFA occlusion and intermittent claudication   . Small bowel obstruction [facility])    Past Surgical History:  Procedure Laterality Date  . aorto-bifem  2003   [facility]   . CAROTID STENT     [place]  . CATARACT EXTRACTION, BILATERAL  2017  . CHOLECYSTECTOMY  2002  . CORONARY ARTERY BYPASS GRAFT N/A 09/16/2019   Procedure: CORONARY ARTERY BYPASS GRAFTING (CABG) using LIMA to LAD; Endoscopic  right saphenous vein harvest to OM1, Diag1, and RCA.;  Surgeon: Wonda Olds, MD;  Location: [place];  Service: Open Heart Surgery;  Laterality: N/A;  . LEFT HEART CATH AND CORONARY ANGIOGRAPHY N/A 09/12/2019   Procedure: LEFT HEART CATH AND CORONARY ANGIOGRAPHY;  Surgeon: Corey Skains, MD;  Location: [place] CV LAB;  Service: Cardiovascular;  Laterality: N/A;  . TEE WITHOUT CARDIOVERSION N/A 09/16/2019   Procedure: TRANSESOPHAGEAL ECHOCARDIOGRAM (TEE);  Surgeon: Wonda Olds, MD;  Location: [place];  Service: Open Heart Surgery;  Laterality: N/A;  . TONSILLECTOMY    . WISDOM TOOTH EXTRACTION     Family History  Problem Relation Age of Onset  . Diabetes Father        DM - and brother   . Stroke Father   . Coronary artery disease Paternal Grandfather   . Colon cancer Neg Hx   . Prostate cancer Neg Hx    Social History   Tobacco Use  . Smoking status: Former Smoker    Packs/day: 1.00    Years: 20.00    Pack years: 20.00    Types: Cigarettes    Quit date: 10/20/1992    Years since quitting: 27.7  . Smokeless tobacco: Never Used  . Tobacco comment: quit in 1994   Substance Use Topics  . Alcohol use: Not Currently    Comment: rare  .  Drug use: No    Pertinent Clinical Results:  LABS: Labs reviewed: Acceptable for surgery.  Hospital Outpatient Visit on 07/20/2020  Component Date Value Ref Range Status  . WBC 07/20/2020 7.1  4.0 - 10.5 K/uL Final  . RBC 07/20/2020 2.57* 4.22 - 5.81 MIL/uL Final  . Hemoglobin 07/20/2020 9.8* 13.0 - 17.0 g/dL Final  . HCT 07/20/2020 31.8* 39 - 52 % Final  . MCV 07/20/2020 123.7* 80.0 - 100.0 fL Final  . MCH 07/20/2020 38.1* 26.0 - 34.0 pg Final  . MCHC 07/20/2020 30.8  30.0 - 36.0 g/dL Final  . RDW 07/20/2020 16.9* 11.5 - 15.5 % Final  . Platelets 07/20/2020 136* 150 - 400 K/uL Final  . nRBC 07/20/2020 0.0  0.0 - 0.2 % Final   Performed at [facility], [address] [place], [place] [address]    ECG: Date:  04/05/2020 Rate: 83 bpm Rhythm: normal sinus Axis (leads I and aVF): Normal Intervals: PR 154 ms. QRS 90 ms. QTc 441 ms. ST segment and T wave changes: No evidence of acute ST segment elevation or depression Comparison: Similar to previous tracing obtained on 09/27/2019 NOTE: Tracing obtained at [facility]; unable for review. Above based on cardiologist's interpretation.    IMAGING / PROCEDURES: CT HEMATURIA WORKUP done on 06/15/2020 1. 18 x 13 x 8 mm polypoid enhancing soft tissue lesion along the posterior bladder dome, highly suspicious for urothelial neoplasm. 2. No evidence for metastatic disease in the abdomen or pelvis. 3. Hepatic and renal cysts. 4.  Left groin hernia contains only fat. 5. Status post aorto bi femoral bypass graft. 6. 9 mm perifissural nodule in the right middle lobe stable since chest CT 09/14/2019. Continued attention on follow-up recommended.  EXERCISE STRESS TEST done on 04/30/2020 1. Exercised on Bruce protocol for 02:06 with truncation of test due to and severe claudication 2. Functional capacity 4.6 METS 3. Resting heart rate of 80 bpm rose to a maximum heart rate of 115 (84% of maximal age-predicted heart rate); normal response to exercise 4. Resting blood pressure of 142/86 mmHg rose to a maximum blood pressure 200/84 mmHg; appropriate response to exercise 5. Resting ECG normal with no evidence of ischemia, arrhythmia, or ST changes  ECHOCARDIOGRAM done on 11/28/2019 1. LVEF 55% 2. Normal LV systolic function with mild LVH 3. Normal RV systolic function 4. Mild TR and PR; trivial MR; no AR 5. No valvular stenosis  TRANSESOPHAGEAL ECHOCARDIOGRAM done on 09/16/2019 Post Bypass:  1. Tricuspid, Pulmonic, Mitral and Aortic valve unchanged.  2. CO & CI wnl.  3. LVEF > 55%, RWA improved. 4. No dissection noted after cannula removed.   LEFT HEART CATHETERIZATION AND CORONARY ANGIOGRAPHY done on 09/12/2019 1. Echocardiogram and LV angiogram  showing normal LV systolic function with an LVEF of 55 to 60%. 2. Mid LM to distal LM lesion is 85% stenosed 3. Proximal circumflex lesions 50% stenosed 4. Proximal LAD to mid LAD lesion is 85% stenosed 5. Proximal LAD lesion is 70% stenosed 6. Mid LAD lesion is 45% stenosed 7. Proximal RCA to mid RCA lesion 70% stenosed   Impression and Plan:  Brandon Lewis has been referred for pre-anesthesia review and clearance prior to him undergoing the planned anesthetic and procedural courses. Available labs, pertinent testing, and imaging results were personally reviewed by me. This patient has been appropriately cleared by cardiology.  Based on clinical review performed today (07/20/20), barring any significant acute changes in the patient's overall condition, it is anticipated  that he will be able to proceed with the planned surgical intervention. Any acute changes in clinical condition may necessitate his procedure being postponed and/or cancelled. Pre-surgical instructions were reviewed with the patient during his PAT appointment and questions were fielded by PAT clinical staff.  Honor Loh, MSN, APRN, FNP-C, CEN [facility]  Peri-operative Services Nurse Practitioner Phone: [phone] 07/20/20 3:48 PM  NOTE: This note has been prepared using Dragon dictation software. Despite my best ability to proofread, there is always the potential that unintentional transcriptional errors may still occur from this process.

## 2020-07-20 NOTE — Addendum Note (Signed)
Addended by: Chrystie Nose on: 07/20/2020 07:54 AM   Modules accepted: Orders

## 2020-07-20 NOTE — Telephone Encounter (Signed)
-----   Message from Nori Riis, PA-C sent at 07/19/2020  4:42 PM EDT ----- Please let Mr. Buttery know that his urine culture came back positive for infection and we need to change his antibiotics from the Septra to Cipro 250 mg, twice daily x7 days

## 2020-07-20 NOTE — Telephone Encounter (Signed)
Notified patient as instructed, patient pleased. Discussed follow-up appointments, patient agrees  

## 2020-07-21 LAB — SARS CORONAVIRUS 2 (TAT 6-24 HRS): SARS Coronavirus 2: NEGATIVE

## 2020-07-24 ENCOUNTER — Ambulatory Visit: Payer: Medicare PPO | Admitting: Urgent Care

## 2020-07-24 ENCOUNTER — Ambulatory Visit
Admission: RE | Admit: 2020-07-24 | Discharge: 2020-07-24 | Disposition: A | Discharge status: Home / Self Care | Payer: Medicare PPO | Source: Ambulatory Visit | Attending: Urology | Admitting: Urology

## 2020-07-24 ENCOUNTER — Other Ambulatory Visit: Payer: Self-pay

## 2020-07-24 ENCOUNTER — Encounter
Admission: RE | Disposition: A | Discharge status: Home / Self Care | Payer: Self-pay | Source: Ambulatory Visit | Attending: Urology

## 2020-07-24 ENCOUNTER — Encounter: Payer: Self-pay | Admitting: Urology

## 2020-07-24 ENCOUNTER — Telehealth: Payer: Self-pay | Admitting: Urology

## 2020-07-24 DIAGNOSIS — I2510 Atherosclerotic heart disease of native coronary artery without angina pectoris: Secondary | ICD-10-CM | POA: Insufficient documentation

## 2020-07-24 DIAGNOSIS — K219 Gastro-esophageal reflux disease without esophagitis: Secondary | ICD-10-CM | POA: Insufficient documentation

## 2020-07-24 DIAGNOSIS — I358 Other nonrheumatic aortic valve disorders: Secondary | ICD-10-CM | POA: Diagnosis not present

## 2020-07-24 DIAGNOSIS — R310 Gross hematuria: Secondary | ICD-10-CM | POA: Diagnosis not present

## 2020-07-24 DIAGNOSIS — Z955 Presence of coronary angioplasty implant and graft: Secondary | ICD-10-CM | POA: Insufficient documentation

## 2020-07-24 DIAGNOSIS — Z951 Presence of aortocoronary bypass graft: Secondary | ICD-10-CM | POA: Diagnosis not present

## 2020-07-24 DIAGNOSIS — E785 Hyperlipidemia, unspecified: Secondary | ICD-10-CM | POA: Insufficient documentation

## 2020-07-24 DIAGNOSIS — C674 Malignant neoplasm of posterior wall of bladder: Secondary | ICD-10-CM | POA: Insufficient documentation

## 2020-07-24 DIAGNOSIS — D494 Neoplasm of unspecified behavior of bladder: Secondary | ICD-10-CM | POA: Diagnosis not present

## 2020-07-24 DIAGNOSIS — I739 Peripheral vascular disease, unspecified: Secondary | ICD-10-CM | POA: Insufficient documentation

## 2020-07-24 DIAGNOSIS — M1990 Unspecified osteoarthritis, unspecified site: Secondary | ICD-10-CM | POA: Insufficient documentation

## 2020-07-24 DIAGNOSIS — I10 Essential (primary) hypertension: Secondary | ICD-10-CM | POA: Diagnosis not present

## 2020-07-24 DIAGNOSIS — Z87891 Personal history of nicotine dependence: Secondary | ICD-10-CM | POA: Insufficient documentation

## 2020-07-24 DIAGNOSIS — C679 Malignant neoplasm of bladder, unspecified: Secondary | ICD-10-CM | POA: Diagnosis not present

## 2020-07-24 DIAGNOSIS — I129 Hypertensive chronic kidney disease with stage 1 through stage 4 chronic kidney disease, or unspecified chronic kidney disease: Secondary | ICD-10-CM | POA: Diagnosis not present

## 2020-07-24 DIAGNOSIS — N183 Chronic kidney disease, stage 3 unspecified: Secondary | ICD-10-CM | POA: Diagnosis not present

## 2020-07-24 HISTORY — PX: TRANSURETHRAL RESECTION OF BLADDER TUMOR WITH MITOMYCIN-C: SHX6459

## 2020-07-24 SURGERY — TRANSURETHRAL RESECTION OF BLADDER TUMOR WITH MITOMYCIN-C
Anesthesia: General

## 2020-07-24 MED ORDER — ONDANSETRON HCL 4 MG/2ML IJ SOLN
INTRAMUSCULAR | Status: DC | PRN
  Administered 2020-07-24: 4 mg via INTRAVENOUS

## 2020-07-24 MED ORDER — FENTANYL CITRATE (PF) 100 MCG/2ML IJ SOLN
INTRAMUSCULAR | Status: AC
  Filled 2020-07-24: qty 2

## 2020-07-24 MED ORDER — PROPOFOL 10 MG/ML IV BOLUS
INTRAVENOUS | Status: DC | PRN
  Administered 2020-07-24: 100 mg via INTRAVENOUS

## 2020-07-24 MED ORDER — FENTANYL CITRATE (PF) 100 MCG/2ML IJ SOLN
25.0000 ug | INTRAMUSCULAR | Status: DC | PRN
  Administered 2020-07-24: 25 ug via INTRAVENOUS

## 2020-07-24 MED ORDER — PHENYLEPHRINE HCL (PRESSORS) 10 MG/ML IV SOLN
INTRAVENOUS | Status: DC | PRN
  Administered 2020-07-24 (×3): 200 ug via INTRAVENOUS

## 2020-07-24 MED ORDER — HYDROCODONE-ACETAMINOPHEN 5-325 MG PO TABS: 0.5000 | ORAL_TABLET | Freq: Four times a day (QID) | ORAL | 0 refills | Status: DC | PRN

## 2020-07-24 MED ORDER — ORAL CARE MOUTH RINSE: 15.0000 mL | Freq: Once | OROMUCOSAL | Status: AC

## 2020-07-24 MED ORDER — CEFAZOLIN SODIUM-DEXTROSE 2-4 GM/100ML-% IV SOLN
INTRAVENOUS | Status: AC
  Filled 2020-07-24: qty 100

## 2020-07-24 MED ORDER — LIDOCAINE HCL (CARDIAC) PF 100 MG/5ML IV SOSY
PREFILLED_SYRINGE | INTRAVENOUS | Status: DC | PRN
  Administered 2020-07-24: 40 mg via INTRAVENOUS

## 2020-07-24 MED ORDER — FENTANYL CITRATE (PF) 100 MCG/2ML IJ SOLN
INTRAMUSCULAR | Status: DC | PRN
  Administered 2020-07-24 (×2): 50 ug via INTRAVENOUS

## 2020-07-24 MED ORDER — CEFAZOLIN SODIUM-DEXTROSE 2-4 GM/100ML-% IV SOLN
2.0000 g | INTRAVENOUS | Status: AC
  Administered 2020-07-24: 2 g via INTRAVENOUS

## 2020-07-24 MED ORDER — CHLORHEXIDINE GLUCONATE 0.12 % MT SOLN: 15.0000 mL | Freq: Once | OROMUCOSAL | Status: AC

## 2020-07-24 MED ORDER — ONDANSETRON HCL 4 MG/2ML IJ SOLN: 4.0000 mg | Freq: Once | INTRAMUSCULAR | Status: DC | PRN

## 2020-07-24 MED ORDER — VASOPRESSIN 20 UNIT/ML IV SOLN
INTRAVENOUS | Status: DC | PRN
  Administered 2020-07-24 (×2): .5 [IU] via INTRAVENOUS

## 2020-07-24 MED ORDER — EPHEDRINE SULFATE 50 MG/ML IJ SOLN
INTRAMUSCULAR | Status: DC | PRN
  Administered 2020-07-24 (×2): 10 mg via INTRAVENOUS

## 2020-07-24 MED ORDER — GEMCITABINE CHEMO FOR BLADDER INSTILLATION 2000 MG
INTRAVENOUS | Status: DC | PRN
  Administered 2020-07-24: 2000 mg via INTRAVESICAL

## 2020-07-24 MED ORDER — LACTATED RINGERS IV SOLN: INTRAVENOUS | Status: DC

## 2020-07-24 MED ORDER — CHLORHEXIDINE GLUCONATE 0.12 % MT SOLN
OROMUCOSAL | Status: AC
  Administered 2020-07-24: 15 mL via OROMUCOSAL
  Filled 2020-07-24: qty 15

## 2020-07-24 SURGICAL SUPPLY — 25 items
BAG DRAIN CYSTO-URO LG1000N (MISCELLANEOUS) ×3 IMPLANT
BAG DRN RND TRDRP ANRFLXCHMBR (UROLOGICAL SUPPLIES) ×1
BAG URINE DRAIN 2000ML AR STRL (UROLOGICAL SUPPLIES) ×3 IMPLANT
CATH FOLEY 2WAY 18X30 (CATHETERS) IMPLANT
CATH FOLEY 2WAY SIL 18X30 (CATHETERS) ×3
DRAPE UTILITY 15X26 TOWEL STRL (DRAPES) ×3 IMPLANT
DRSG TELFA 4X3 1S NADH ST (GAUZE/BANDAGES/DRESSINGS) ×3 IMPLANT
ELECT LOOP 22F BIPOLAR SML (ELECTROSURGICAL)
ELECT REM PT RETURN 9FT ADLT (ELECTROSURGICAL)
ELECTRODE LOOP 22F BIPOLAR SML (ELECTROSURGICAL) IMPLANT
ELECTRODE REM PT RTRN 9FT ADLT (ELECTROSURGICAL) IMPLANT
GLOVE BIOGEL PI IND STRL 7.5 (GLOVE) ×1 IMPLANT
GLOVE BIOGEL PI INDICATOR 7.5 (GLOVE) ×2
GOWN STRL REUS W/ TWL LRG LVL3 (GOWN DISPOSABLE) ×1 IMPLANT
GOWN STRL REUS W/TWL LRG LVL3 (GOWN DISPOSABLE) ×3
GOWN STRL REUS W/TWL XL LVL4 (GOWN DISPOSABLE) ×3 IMPLANT
IV NS IRRIG 3000ML ARTHROMATIC (IV SOLUTION) ×6 IMPLANT
KIT TURNOVER CYSTO (KITS) ×3 IMPLANT
LOOP CUT BIPOLAR 24F LRG (ELECTROSURGICAL) ×2 IMPLANT
PACK CYSTO AR (MISCELLANEOUS) ×3 IMPLANT
SET IRRIG Y TYPE TUR BLADDER L (SET/KITS/TRAYS/PACK) ×3 IMPLANT
SURGILUBE 2OZ TUBE FLIPTOP (MISCELLANEOUS) ×3 IMPLANT
SYR TOOMEY IRRIG 70ML (MISCELLANEOUS) ×3
SYRINGE TOOMEY IRRIG 70ML (MISCELLANEOUS) ×1 IMPLANT
WATER STERILE IRR 1000ML POUR (IV SOLUTION) ×3 IMPLANT

## 2020-07-24 NOTE — Telephone Encounter (Signed)
-----   Message from Abbie Sons, MD sent at 07/24/2020 10:00 AM EDT ----- Regarding: Catheter removal Please schedule a visit tomorrow morning for catheter removal.  He does not need a voiding trial.

## 2020-07-24 NOTE — Op Note (Signed)
Preoperative diagnosis: 1. Bladder tumor (2-5 cm)  Postoperative diagnosis:  1. Bladder tumor (2-5 cm)  Procedure:  1. Cystoscopy 2. Transurethral resection of bladder tumor (2-5 cm) 3. Instillation intravesical gemcitabine  Surgeon: Nicki Reaper C. Jionni Helming, M.D.  Anesthesia: General  Complications: None  Intraoperative findings:  1. 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 Cystoscopy: Urethra normal in caliber without stricture; prostate with moderate lateral lobe enlargement; ureteral orifices orthotopic with clear efflux; moderate bladder trabeculation  EBL: Minimal  Specimens: 1. Bladder tumor 2. Base of bladder tumor      Indication: Brandon Lewis is a 86 y.o male with history of gross hematuria.  CTU showed no upper tract abnormalities and cystoscopy shows a papillary bladder tumor right posterior wall.  After reviewing the management options for treatment, he elected to proceed with the above surgical procedure(s). We have discussed the potential benefits and risks of the procedure, side effects of the proposed treatment, the likelihood of the patient achieving the goals of the procedure, and any potential problems that might occur during the procedure or recuperation. Informed consent has been obtained.  Description of procedure:  The patient was taken to the operating room and general anesthesia was induced.  The patient was placed in the dorsal lithotomy position, prepped and draped in the usual sterile fashion, and preoperative antibiotics were administered. A preoperative time-out was performed.   A 24 French continuous-flow resectoscope sheath with visual obturator was lubricated and passed per urethra and advanced into the bladder with findings as described above.  The bladder mucosa was closely inspected with findings as described above.  Using bipolar loop cautery resection, the entire tumor was resected and removed for  permanent pathologic analysis.   A second resection of the base of tumor was performed and sent separately.  Hemostasis was then achieved with the loop cautery and the bladder was emptied and reinspected with no further bleeding noted at the end of the procedure.    The bladder was then emptied and the procedure ended.  A 16 French Foley catheter was placed with return of clear effluent upon irrigation.  The patient appeared to tolerate the procedure well and without complications.  After anesthetic reversal he was transported to the PACU in stable condition.   2000 mg of intravesical gemcitabine was instilled to the bladder.  This was allowed to dwell in the PACU for 1 hour.  This was well-tolerated. After 1 hour, the catheter was drained and the catheter was placed to gravity drainage.  Plan: He will will be discharged with an indwelling Foley catheter with office follow-up 07/25/2020 for catheter removal.   Berle Fitz C. Bernardo Heater,  MD

## 2020-07-24 NOTE — Anesthesia Procedure Notes (Signed)
Procedure Name: LMA Insertion Performed by: Staci Acosta, CRNA Pre-anesthesia Checklist: Patient identified, Patient being monitored, Timeout performed, Emergency Drugs available and Suction available Patient Re-evaluated:Patient Re-evaluated prior to induction Oxygen Delivery Method: Circle system utilized Preoxygenation: Pre-oxygenation with 100% oxygen Induction Type: IV induction Ventilation: Mask ventilation without difficulty LMA: LMA inserted LMA Size: 4.0 Tube type: Oral Number of attempts: 1 Placement Confirmation: positive ETCO2 and breath sounds checked- equal and bilateral Tube secured with: Tape Dental Injury: Teeth and Oropharynx as per pre-operative assessment

## 2020-07-24 NOTE — Telephone Encounter (Signed)
LM TO CB TO CONFIRM APP  MICHELLE 

## 2020-07-24 NOTE — Discharge Instructions (Signed)
Transurethral Resection of Bladder Tumor (TURBT) or Bladder Biopsy   Definition:  Transurethral Resection of the Bladder Tumor is a surgical procedure used to diagnose and remove tumors within the bladder. T  General instructions:     Your recent bladder surgery requires very little post hospital care but some definite precautions.  Despite the fact that no skin incisions were used, the area around the bladder incisions are raw and covered with scabs to promote healing and prevent bleeding. Certain precautions are needed to insure that the scabs are not disturbed over the next 2-4 weeks while the healing proceeds.  Because the raw surface inside your bladder and the irritating effects of urine you may expect frequency of urination and/or urgency (a stronger desire to urinate) and perhaps even getting up at night more often. This will usually resolve or improve slowly over the healing period. You may see some blood in your urine over the first 6 weeks. Do not be alarmed, even if the urine was clear for a while. Get off your feet and drink lots of fluids until clearing occurs. If you start to pass clots or don't improve call us.  Diet:  You may return to your normal diet immediately. Because of the raw surface of your bladder, alcohol, spicy foods, foods high in acid and drinks with caffeine may cause irritation or frequency and should be used in moderation. To keep your urine flowing freely and avoid constipation, drink plenty of fluids during the day (8-10 glasses). Tip: Avoid cranberry juice because it is very acidic.  Activity:  Your physical activity doesn't need to be restricted. However, if you are very active, you may see some blood in the urine. We suggest that you reduce your activity under the circumstances until the bleeding has stopped.  Bowels:  It is important to keep your bowels regular during the postoperative period. Straining with bowel movements can cause bleeding. A bowel  movement every other day is reasonable. Use a mild laxative if needed, such as milk of magnesia 2-3 tablespoons, or 2 Dulcolax tablets. Call if you continue to have problems. If you had been taking narcotics for pain, before, during or after your surgery, you may be constipated. Take a laxative if necessary.    Medication:  You should resume your pre-surgery medications unless told not to. In addition you may be given an antibiotic to prevent or treat infection. Antibiotics are not always necessary. All medication should be taken as prescribed until the bottles are finished unless you are having an unusual reaction to one of the drugs.  You may resume your aspirin and Plavix and 24-48 hours when your urine has no visible blood.  [facility] [address] [place], [place] [address] [phone]   AMBULATORY SURGERY  DISCHARGE INSTRUCTIONS   1) The drugs that you were given will stay in your system until tomorrow so for the next 24 hours you should not:  A) Drive an automobile B) Make any legal decisions C) Drink any alcoholic beverage   2) You may resume regular meals tomorrow.  Today it is better to start with liquids and gradually work up to solid foods.  You may eat anything you prefer, but it is better to start with liquids, then soup and crackers, and gradually work up to solid foods.   3) Please notify your doctor immediately if you have any unusual bleeding, trouble breathing, redness and pain at the surgery site, drainage, fever, or pain not  relieved by medication.    4) Additional Instructions:        Please contact your physician with any problems or Same Day Surgery at [phone], Monday through Friday 6 am to 4 pm, or [place] [facility] number at [phone].    Indwelling Urinary Catheter Care, Adult An indwelling urinary catheter is a thin tube that is put into your bladder. The tube helps to drain pee  (urine) out of your body. The tube goes in through your urethra. Your urethra is where pee comes out of your body. Your pee will come out through the catheter, then it will go into a bag (drainage bag). Take good care of your catheter so it will work well. How to wear your catheter and bag Supplies needed  Sticky tape (adhesive tape) or a leg strap.  Alcohol wipe or soap and water (if you use tape).  A clean towel (if you use tape).  Large overnight bag.  Smaller bag (leg bag). Wearing your catheter Attach your catheter to your leg with tape or a leg strap.  Make sure the catheter is not pulled tight.  If a leg strap gets wet, take it off and put on a dry strap.  If you use tape to hold the bag on your leg: 1. Use an alcohol wipe or soap and water to wash your skin where the tape made it sticky before. 2. Use a clean towel to pat-dry that skin. 3. Use new tape to make the bag stay on your leg. Wearing your bags You should have been given a large overnight bag.  You may wear the overnight bag in the day or night.  Always have the overnight bag lower than your bladder.  Do not let the bag touch the floor.  Before you go to sleep, put a clean plastic bag in a wastebasket. Then hang the overnight bag inside the wastebasket. You should also have a smaller leg bag that fits under your clothes.  Always wear the leg bag below your knee.  Do not wear your leg bag at night. How to care for your skin and catheter Supplies needed  A clean washcloth.  Water and mild soap.  A clean towel. Caring for your skin and catheter      Clean the skin around your catheter every day: 1. Wash your hands with soap and water. 2. Wet a clean washcloth in warm water and mild soap. 3. Clean the skin around your urethra.  If you are male:  Gently spread the folds of skin around your vagina (labia).  With the washcloth in your other hand, wipe the inner side of your labia on each side.  Wipe from front to back.  If you are male:  Pull back any skin that covers the end of your penis (foreskin).  With the washcloth in your other hand, wipe your penis in small circles. Start wiping at the tip of your penis, then move away from the catheter.  Move the foreskin back in place, if needed. 4. With your free hand, hold the catheter close to where it goes into your body.  Keep holding the catheter during cleaning so it does not get pulled out. 5. With the washcloth in your other hand, clean the catheter.  Only wipe downward on the catheter.  Do not wipe upward toward your body. Doing this may push germs into your urethra and cause infection. 6. Use a clean towel to pat-dry the catheter and  the skin around it. Make sure to wipe off all soap. 7. Wash your hands with soap and water.  Shower every day. Do not take baths.  Do not use cream, ointment, or lotion on the area where the catheter goes into your body, unless your doctor tells you to.  Do not use powders, sprays, or lotions on your genital area.  Check your skin around the catheter every day for signs of infection. Check for: ? Redness, swelling, or pain. ? Fluid or blood. ? Warmth. ? Pus or a bad smell. How to empty the bag Supplies needed  Rubbing alcohol.  Gauze pad or cotton ball.  Tape or a leg strap. Emptying the bag Pour the pee out of your bag when it is ?- full, or at least 2-3 times a day. Do this for your overnight bag and your leg bag. 1. Wash your hands with soap and water. 2. Separate (detach) the bag from your leg. 3. Hold the bag over the toilet or a clean pail. Keep the bag lower than your hips and bladder. This is so the pee (urine) does not go back into the tube. 4. Open the pour spout. It is at the bottom of the bag. 5. Empty the pee into the toilet or pail. Do not let the pour spout touch any surface. 6. Put rubbing alcohol on a gauze pad or cotton ball. 7. Use the gauze pad or cotton  ball to clean the pour spout. 8. Close the pour spout. 9. Attach the bag to your leg with tape or a leg strap. 10. Wash your hands with soap and water. Follow instructions for cleaning the drainage bag:  From the product maker.  As told by your doctor. How to change the bag Supplies needed  Alcohol wipes.  A clean bag.  Tape or a leg strap. Changing the bag Replace your bag when it starts to leak, smell bad, or look dirty. 1. Wash your hands with soap and water. 2. Separate the dirty bag from your leg. 3. Pinch the catheter with your fingers so that pee does not spill out. 4. Separate the catheter tube from the bag tube where these tubes connect (at the connection valve). Do not let the tubes touch any surface. 5. Clean the end of the catheter tube with an alcohol wipe. Use a different alcohol wipe to clean the end of the bag tube. 6. Connect the catheter tube to the tube of the clean bag. 7. Attach the clean bag to your leg with tape or a leg strap. Do not make the bag tight on your leg. 8. Wash your hands with soap and water. General rules   Never pull on your catheter. Never try to take it out. Doing that can hurt you.  Always wash your hands before and after you touch your catheter or bag. Use a mild, fragrance-free soap. If you do not have soap and water, use hand sanitizer.  Always make sure there are no twists or bends (kinks) in the catheter tube.  Always make sure there are no leaks in the catheter or bag.  Drink enough fluid to keep your pee pale yellow.  Do not take baths, swim, or use a hot tub.  If you are male, wipe from front to back after you poop (have a bowel movement). Contact a doctor if:  Your pee is cloudy.  Your pee smells worse than usual.  Your catheter gets clogged.  Your catheter leaks.  Your  bladder feels full. Get help right away if:  You have redness, swelling, or pain where the catheter goes into your body.  You have fluid,  blood, pus, or a bad smell coming from the area where the catheter goes into your body.  Your skin feels warm where the catheter goes into your body.  You have a fever.  You have pain in your: ? Belly (abdomen). ? Legs. ? Lower back. ? Bladder.  You see blood in the catheter.  Your pee is pink or red.  You feel sick to your stomach (nauseous).  You throw up (vomit).  You have chills.  Your pee is not draining into the bag.  Your catheter gets pulled out. Summary  An indwelling urinary catheter is a thin tube that is placed into the bladder to help drain pee (urine) out of the body.  The catheter is placed into the part of the body that drains pee from the bladder (urethra).  Taking good care of your catheter will keep it working properly and help prevent problems.  Always wash your hands before and after touching your catheter or bag.  Never pull on your catheter or try to take it out. This information is not intended to replace advice given to you by your health care provider. Make sure you discuss any questions you have with your health care provider. Document Revised: 01/28/2019 Document Reviewed: 05/22/2017 Elsevier Patient Education  [place].

## 2020-07-24 NOTE — Transfer of Care (Signed)
Immediate Anesthesia Transfer of Care Note  Patient: Brandon Lewis  Procedure(s) Performed: TRANSURETHRAL RESECTION OF BLADDER TUMOR WITH Gemcitabine (N/A )  Patient Location: PACU  Anesthesia Type:General  Level of Consciousness: drowsy and patient cooperative  Airway & Oxygen Therapy: Patient connected to face mask oxygen  Post-op Assessment: Report given to RN and Post -op Vital signs reviewed and stable  Post vital signs: Reviewed and stable  Last Vitals:  Vitals Value Taken Time  BP 108/59 07/24/20 0937  Temp    Pulse 76 07/24/20 0939  Resp 13 07/24/20 0939  SpO2 98 % 07/24/20 0939  Vitals shown include unvalidated device data.  Last Pain:  Vitals:   07/24/20 0749  TempSrc: Temporal  PainSc: 0-No pain         Complications: No complications documented.

## 2020-07-24 NOTE — Anesthesia Preprocedure Evaluation (Signed)
Anesthesia Evaluation  Patient identified by MRN, date of birth, ID band Patient awake    Reviewed: Allergy & Precautions, NPO status , Patient's Chart, lab work & pertinent test results  History of Anesthesia Complications Negative for: history of anesthetic complications  Airway Mallampati: II  TM Distance: >3 FB Neck ROM: Full    Dental  (+) Poor Dentition   Pulmonary neg sleep apnea, neg COPD, former smoker,    breath sounds clear to auscultation- rhonchi (-) wheezing      Cardiovascular hypertension, Pt. on medications + CAD, + Past MI, + Cardiac Stents and + CABG   Rhythm:Regular Rate:Normal - Systolic murmurs and - Diastolic murmurs Echo 93/26/71: 1. Left ventricular ejection fraction, by visual estimation, is 70 to  75%. The left ventricle has hyperdynamic function. There is no left  ventricular hypertrophy.  2. Global right ventricle has normal systolic function.The right  ventricular size is normal. No increase in right ventricular wall  thickness.  3. Left atrial size was normal.  4. Right atrial size was normal.  5. Mild mitral annular calcification.  6. The mitral valve is normal in structure. No evidence of mitral valve  regurgitation.  7. The tricuspid valve is normal in structure. Tricuspid valve  regurgitation is not demonstrated.  8. The aortic valve is tricuspid. Aortic valve regurgitation is not  visualized. Mild aortic valve sclerosis without stenosis.  9. The pulmonic valve was not well visualized. Pulmonic valve  regurgitation is not visualized.    Neuro/Psych neg Seizures negative neurological ROS  negative psych ROS   GI/Hepatic Neg liver ROS, GERD  ,  Endo/Other  negative endocrine ROSneg diabetes  Renal/GU Renal InsufficiencyRenal disease     Musculoskeletal  (+) Arthritis ,   Abdominal (+) - obese,   Peds  Hematology negative hematology ROS (+)   Anesthesia Other  Findings Past Medical History: No date: CAD (coronary artery disease)     Comment:  s/p PCI after presenting with exertional angina (drug               eluting stent) No date: Diverticulosis No date: GERD (gastroesophageal reflux disease) No date: HTN (hypertension) No date: Hyperlipidemia 09/2019: Myocardial infarction ([facility])     Comment:  bypass and stent placed No date: Osteoarthritis No date: PVD (peripheral vascular disease) ([facility])     Comment:  s/p aortobifemoral bypass, with bilateral SFA occlusion               and intermittent claudication  No date: Small bowel obstruction ([facility])   Reproductive/Obstetrics                             Anesthesia Physical Anesthesia Plan  ASA: III  Anesthesia Plan: General   Post-op Pain Management:    Induction: Intravenous  PONV Risk Score and Plan: 1 and Ondansetron  Airway Management Planned: LMA  Additional Equipment:   Intra-op Plan:   Post-operative Plan:   Informed Consent: I have reviewed the patients History and Physical, chart, labs and discussed the procedure including the risks, benefits and alternatives for the proposed anesthesia with the patient or authorized representative who has indicated his/her understanding and acceptance.     Dental advisory given  Plan Discussed with: CRNA and Anesthesiologist  Anesthesia Plan Comments:         Anesthesia Quick Evaluation

## 2020-07-24 NOTE — Progress Notes (Signed)
   07/24/20 0750  Clinical Encounter Type  Visited With Family  Visit Type Initial  Referral From Chaplain  Consult/Referral To Chaplain  While rounding [facility] waiting area, chaplain briefly spoke to Pt's son. Pt's son said he was going well. Chaplain wished him well and left.

## 2020-07-24 NOTE — Interval H&P Note (Signed)
History and Physical Interval Note:  CV: RRR Lungs: Clear  07/24/2020 8:37 AM  Brandon Lewis  has presented today for surgery, with the diagnosis of bladder tumor.  The various methods of treatment have been discussed with the patient and family. After consideration of risks, benefits and other options for treatment, the patient has consented to  Procedure(s): TRANSURETHRAL RESECTION OF BLADDER TUMOR WITH Gemcitabine (N/A) as a surgical intervention.  The patient's history has been reviewed, patient examined, no change in status, stable for surgery.  I have reviewed the patient's chart and labs.  Questions were answered to the patient's satisfaction.     [place]

## 2020-07-24 NOTE — Anesthesia Postprocedure Evaluation (Signed)
Anesthesia Post Note  Patient: Brandon Lewis  Procedure(s) Performed: TRANSURETHRAL RESECTION OF BLADDER TUMOR WITH Gemcitabine (N/A )  Patient location during evaluation: PACU Anesthesia Type: General Level of consciousness: awake and alert Pain management: pain level controlled Vital Signs Assessment: post-procedure vital signs reviewed and stable Respiratory status: spontaneous breathing, nonlabored ventilation, respiratory function stable and patient connected to nasal cannula oxygen Cardiovascular status: blood pressure returned to baseline and stable Postop Assessment: no apparent nausea or vomiting Anesthetic complications: no   No complications documented.   Last Vitals:  Vitals:   07/24/20 1052 07/24/20 1104  BP: 129/82 130/73  Pulse: 73 74  Resp: (!) 21   Temp: 36.6 C (!) 36.2 C  SpO2: 98% 100%    Last Pain:  Vitals:   07/24/20 1104  TempSrc: Tympanic  PainSc: 4                  Martha Clan

## 2020-07-25 ENCOUNTER — Ambulatory Visit (INDEPENDENT_AMBULATORY_CARE_PROVIDER_SITE_OTHER): Payer: Medicare PPO | Admitting: Physician Assistant

## 2020-07-25 ENCOUNTER — Encounter: Payer: Self-pay | Admitting: Physician Assistant

## 2020-07-25 VITALS — BP 106/67 | HR 92 | Ht 70.0 in | Wt 198.0 lb

## 2020-07-25 DIAGNOSIS — D494 Neoplasm of unspecified behavior of bladder: Secondary | ICD-10-CM

## 2020-07-25 LAB — SURGICAL PATHOLOGY

## 2020-07-25 NOTE — Progress Notes (Signed)
Catheter Removal  Patient is present today for a catheter removal.  81ml of water was drained from the balloon. A 16FR foley cath was removed from the bladder no complications were noted . Patient tolerated well.  Performed by: Debroah Loop, PA-C   Follow up/ Additional notes: Dr. Bernardo Heater to call with pathology results, plan.

## 2020-07-26 ENCOUNTER — Emergency Department: Payer: Medicare PPO

## 2020-07-26 ENCOUNTER — Emergency Department
Admission: EM | Admit: 2020-07-26 | Discharge: 2020-07-26 | Disposition: A | Discharge status: Home / Self Care | Payer: Medicare PPO | Attending: Emergency Medicine | Admitting: Emergency Medicine

## 2020-07-26 ENCOUNTER — Other Ambulatory Visit: Payer: Self-pay

## 2020-07-26 ENCOUNTER — Telehealth (INDEPENDENT_AMBULATORY_CARE_PROVIDER_SITE_OTHER): Payer: Medicare PPO | Admitting: Urology

## 2020-07-26 ENCOUNTER — Encounter: Payer: Self-pay | Admitting: Emergency Medicine

## 2020-07-26 DIAGNOSIS — T83511A Infection and inflammatory reaction due to indwelling urethral catheter, initial encounter: Secondary | ICD-10-CM

## 2020-07-26 DIAGNOSIS — K4090 Unilateral inguinal hernia, without obstruction or gangrene, not specified as recurrent: Secondary | ICD-10-CM | POA: Diagnosis not present

## 2020-07-26 DIAGNOSIS — Z79899 Other long term (current) drug therapy: Secondary | ICD-10-CM | POA: Diagnosis not present

## 2020-07-26 DIAGNOSIS — I2510 Atherosclerotic heart disease of native coronary artery without angina pectoris: Secondary | ICD-10-CM | POA: Diagnosis not present

## 2020-07-26 DIAGNOSIS — Z951 Presence of aortocoronary bypass graft: Secondary | ICD-10-CM | POA: Insufficient documentation

## 2020-07-26 DIAGNOSIS — N281 Cyst of kidney, acquired: Secondary | ICD-10-CM | POA: Diagnosis not present

## 2020-07-26 DIAGNOSIS — Z7982 Long term (current) use of aspirin: Secondary | ICD-10-CM | POA: Diagnosis not present

## 2020-07-26 DIAGNOSIS — N400 Enlarged prostate without lower urinary tract symptoms: Secondary | ICD-10-CM | POA: Diagnosis not present

## 2020-07-26 DIAGNOSIS — R339 Retention of urine, unspecified: Secondary | ICD-10-CM

## 2020-07-26 DIAGNOSIS — C679 Malignant neoplasm of bladder, unspecified: Secondary | ICD-10-CM | POA: Diagnosis not present

## 2020-07-26 DIAGNOSIS — R1030 Lower abdominal pain, unspecified: Secondary | ICD-10-CM | POA: Diagnosis present

## 2020-07-26 DIAGNOSIS — N2889 Other specified disorders of kidney and ureter: Secondary | ICD-10-CM | POA: Diagnosis not present

## 2020-07-26 DIAGNOSIS — Z87891 Personal history of nicotine dependence: Secondary | ICD-10-CM | POA: Diagnosis not present

## 2020-07-26 DIAGNOSIS — I129 Hypertensive chronic kidney disease with stage 1 through stage 4 chronic kidney disease, or unspecified chronic kidney disease: Secondary | ICD-10-CM | POA: Diagnosis not present

## 2020-07-26 DIAGNOSIS — N183 Chronic kidney disease, stage 3 unspecified: Secondary | ICD-10-CM | POA: Diagnosis not present

## 2020-07-26 DIAGNOSIS — N390 Urinary tract infection, site not specified: Secondary | ICD-10-CM | POA: Diagnosis not present

## 2020-07-26 DIAGNOSIS — Z7901 Long term (current) use of anticoagulants: Secondary | ICD-10-CM | POA: Insufficient documentation

## 2020-07-26 LAB — URINALYSIS, COMPLETE (UACMP) WITH MICROSCOPIC
Bilirubin Urine: NEGATIVE
Glucose, UA: NEGATIVE mg/dL
Ketones, ur: NEGATIVE mg/dL
Leukocytes,Ua: NEGATIVE
Nitrite: NEGATIVE
Protein, ur: 30 mg/dL — AB
Specific Gravity, Urine: 1.009 (ref 1.005–1.030)
Squamous Epithelial / HPF: NONE SEEN (ref 0–5)
pH: 5 (ref 5.0–8.0)

## 2020-07-26 LAB — COMPREHENSIVE METABOLIC PANEL
ALT: 15 U/L (ref 0–44)
AST: 27 U/L (ref 15–41)
Albumin: 3.5 g/dL (ref 3.5–5.0)
Alkaline Phosphatase: 65 U/L (ref 38–126)
Anion gap: 8 (ref 5–15)
BUN: 43 mg/dL — ABNORMAL HIGH (ref 8–23)
CO2: 21 mmol/L — ABNORMAL LOW (ref 22–32)
Calcium: 9 mg/dL (ref 8.9–10.3)
Chloride: 104 mmol/L (ref 98–111)
Creatinine, Ser: 1.64 mg/dL — ABNORMAL HIGH (ref 0.61–1.24)
GFR calc non Af Amer: 38 mL/min — ABNORMAL LOW (ref >60)
Glucose, Bld: 176 mg/dL — ABNORMAL HIGH (ref 70–99)
Potassium: 4.4 mmol/L (ref 3.5–5.1)
Sodium: 133 mmol/L — ABNORMAL LOW (ref 135–145)
Total Bilirubin: 1.2 mg/dL (ref 0.3–1.2)
Total Protein: 7.5 g/dL (ref 6.5–8.1)

## 2020-07-26 LAB — CBC
HCT: 30.4 % — ABNORMAL LOW (ref 39.0–52.0)
Hemoglobin: 10.1 g/dL — ABNORMAL LOW (ref 13.0–17.0)
MCH: 39.1 pg — ABNORMAL HIGH (ref 26.0–34.0)
MCHC: 33.2 g/dL (ref 30.0–36.0)
MCV: 117.8 fL — ABNORMAL HIGH (ref 80.0–100.0)
Platelets: 131 10*3/uL — ABNORMAL LOW (ref 150–400)
RBC: 2.58 MIL/uL — ABNORMAL LOW (ref 4.22–5.81)
RDW: 17.1 % — ABNORMAL HIGH (ref 11.5–15.5)
WBC: 6.8 10*3/uL (ref 4.0–10.5)
nRBC: 0 % (ref 0.0–0.2)

## 2020-07-26 MED ORDER — LIDOCAINE HCL URETHRAL/MUCOSAL 2 % EX GEL
1.0000 "application " | Freq: Once | CUTANEOUS | Status: AC
  Administered 2020-07-26: 1 via URETHRAL

## 2020-07-26 MED ORDER — CEPHALEXIN 500 MG PO CAPS: 500.0000 mg | ORAL_CAPSULE | Freq: Four times a day (QID) | ORAL | 0 refills | Status: AC

## 2020-07-26 NOTE — ED Provider Notes (Signed)
[facility] Emergency Department Provider Note  ____________________________________________   First MD Initiated Contact with Patient 07/26/20 [phone]     (approximate)  I have reviewed the triage vital signs and the nursing notes.   HISTORY  Chief Complaint Post-op Problem   HPI Brandon Lewis is a 86 y.o. male with below list of previous medical conditions including recent transurethral bladder tumor resection on 07/24/2020 with catheter removal on 07/25/2020 presents to the emergency department secondary to inability to urinate since midnight with worsening suprapubic abdominal pain.  Patient current pain score 8 out of 10.  Patient states that he was able to void after catheter was removed yesterday at Dr. Dene Gentry office.  Patient denies any fever no back pain no nausea or vomiting.  No constipation or diarrhea.     Past Medical History:  Diagnosis Date   CAD (coronary artery disease)    s/p PCI after presenting with exertional angina (drug eluting stent)   Diverticulosis    GERD (gastroesophageal reflux disease)    HTN (hypertension)    Hyperlipidemia    Myocardial infarction ([place]) 09/2019   bypass and stent placed   Osteoarthritis    PVD (peripheral vascular disease) ([place])    s/p aortobifemoral bypass, with bilateral SFA occlusion and intermittent claudication    Small bowel obstruction [facility])     Patient Active Problem List   Diagnosis Date Noted   Thoracic aortic aneurysm ([place]) 05/17/2020   Aortic atherosclerosis ([place]) 05/17/2020   Hematuria 05/09/2020   BPH (benign prostatic hyperplasia) 11/16/2019   S/P CABG x 4    Atrial flutter ([place])    Coronary artery disease 09/16/2019   Allergic rhinitis 09/12/2019   Coronary artery disease due to lipid rich plaque    Essential hypertension    Sciatica, right side 09/22/2018   Chronic renal disease, stage III ([place]) 09/14/2018   Actinic keratosis 08/14/2015   Advanced  directives, counseling/discussion 07/25/2014   IMPAIRED FASTING GLUCOSE 03/08/2010   CAD, NATIVE VESSEL 05/30/2009   Hyperlipemia 03/02/2009   Essential hypertension, benign 09/22/2007   Osteoarthritis, generalized 09/22/2007    Past Surgical History:  Procedure Laterality Date   aorto-bifem  2003   [facility]    CAROTID STENT     [facility]   CATARACT EXTRACTION, BILATERAL  2017   CHOLECYSTECTOMY  2002   CORONARY ARTERY BYPASS GRAFT N/A 09/16/2019   Procedure: CORONARY ARTERY BYPASS GRAFTING (CABG) using LIMA to LAD; Endoscopic right saphenous vein harvest to OM1, Diag1, and RCA.;  Surgeon: Wonda Olds, MD;  Location: [place];  Service: Open Heart Surgery;  Laterality: N/A;   LEFT HEART CATH AND CORONARY ANGIOGRAPHY N/A 09/12/2019   Procedure: LEFT HEART CATH AND CORONARY ANGIOGRAPHY;  Surgeon: Corey Skains, MD;  Location: [place] CV LAB;  Service: Cardiovascular;  Laterality: N/A;   TEE WITHOUT CARDIOVERSION N/A 09/16/2019   Procedure: TRANSESOPHAGEAL ECHOCARDIOGRAM (TEE);  Surgeon: Wonda Olds, MD;  Location: [place];  Service: Open Heart Surgery;  Laterality: N/A;   TONSILLECTOMY     TRANSURETHRAL RESECTION OF BLADDER TUMOR WITH MITOMYCIN-C N/A 07/24/2020   Procedure: TRANSURETHRAL RESECTION OF BLADDER TUMOR WITH Gemcitabine;  Surgeon: Abbie Sons, MD;  Location: [facility] ORS;  Service: Urology;  Laterality: N/A;   WISDOM TOOTH EXTRACTION      Prior to Admission medications   Medication Sig Start Date End Date Taking? Authorizing Provider  aspirin 81 MG chewable tablet Chew 1 tablet (81 mg total) by mouth daily. 10/03/19  Antony Odea, PA-C  cholecalciferol (VITAMIN D) 1000 UNITS tablet Take 1,000 Units by mouth at bedtime.     [provider]  ciprofloxacin (CIPRO) 250 MG tablet Take 1 tablet (250 mg total) by mouth 2 (two) times daily for 7 days. 07/20/20 07/27/20  Zara Council A, PA-C  clopidogrel (PLAVIX) 75 MG tablet TAKE ONE  TABLET BY MOUTH EVERY DAY *BOTTLE* Patient taking differently: Take 75 mg by mouth daily.  11/17/19   Venia Carbon, MD  HYDROcodone-acetaminophen (NORCO/VICODIN) 5-325 MG tablet Take 0.5-1 tablets by mouth every 6 (six) hours as needed for moderate pain. 07/24/20   Stoioff, Ronda Fairly, MD  Magnesium 400 MG CAPS Take 400 mg by mouth every morning.     [provider]  metoprolol succinate (TOPROL-XL) 50 MG 24 hr tablet **VIAL ONLY** TAKE (1) TABLET BY MOUTH ONCE A DAY.DO NOT CRUSH. (HIGHBLOOD PRESSURE) *BOTTLE* Patient taking differently: Take 50 mg by mouth every morning.  11/17/19   Venia Carbon, MD  Multiple Vitamin (MULTIVITAMIN WITH MINERALS) TABS tablet Take 1 tablet by mouth every morning.    [provider]  pantoprazole (PROTONIX) 40 MG tablet Take 40 mg by mouth daily.    [provider]  rosuvastatin (CRESTOR) 10 MG tablet **VIAL ONLY** TAKE ONE TABLET BY MOUTH AT BEDTIME. (IMPROVES CHOLESTEROL) Patient taking differently: Take 10 mg by mouth every morning.  11/17/19   Venia Carbon, MD  sodium chloride (BRONCHO SALINE) inhaler solution Take 1 spray by nebulization daily as needed (congestion).     [provider]  tamsulosin (FLOMAX) 0.4 MG CAPS capsule TAKE 1 CAPSULE BY MOUTH ONCE DAILY Patient taking differently: Take 0.4 mg by mouth daily after breakfast.  01/10/20   Venia Carbon, MD    Allergies Cilostazol and Simvastatin  Family History  Problem Relation Age of Onset   Diabetes Father        DM - and brother    Stroke Father    Coronary artery disease Paternal Grandfather    Colon cancer Neg Hx    Prostate cancer Neg Hx     Social History Social History   Tobacco Use   Smoking status: Former Smoker    Packs/day: 1.00    Years: 20.00    Pack years: 20.00    Types: Cigarettes    Quit date: 10/20/1992    Years since quitting: 27.7   Smokeless tobacco: Never Used   Tobacco comment: quit in 1994   Substance Use  Topics   Alcohol use: Not Currently    Comment: rare   Drug use: No    Review of Systems Constitutional: No fever/chills Eyes: No visual changes. ENT: No sore throat. Cardiovascular: Denies chest pain. Respiratory: Denies shortness of breath. Gastrointestinal: No abdominal pain.  No nausea, no vomiting.  No diarrhea.  No constipation. Genitourinary: Positive for inability to urinate suprapubic pain Musculoskeletal: Negative for neck pain.  Negative for back pain. Integumentary: Negative for rash. Neurological: Negative for headaches, focal weakness or numbness.   ____________________________________________   PHYSICAL EXAM:  VITAL SIGNS: ED Triage Vitals  Enc Vitals Group     BP 07/26/20 0444 (!) 158/89     Pulse Rate 07/26/20 0444 96     Resp 07/26/20 0444 20     Temp 07/26/20 0444 97.6 F (36.4 C)     Temp Source 07/26/20 0444 Oral     SpO2 07/26/20 0444 98 %     Weight 07/26/20 0444 88.5 kg (  195 lb)     Height 07/26/20 0444 1.778 m (5\' 10" )     Head Circumference --      Peak Flow --      Pain Score 07/26/20 0446 8     Pain Loc --      Pain Edu? --      Excl. in [place]? --     Constitutional: Alert and oriented.  Apparent discomfort Eyes: Conjunctivae are normal.  Head: Atraumatic. Mouth/Throat: Patient is wearing a mask. Neck: No stridor.  No meningeal signs.   Cardiovascular: Normal rate, regular rhythm. Good peripheral circulation. Grossly normal heart sounds. Respiratory: Normal respiratory effort.  No retractions. Gastrointestinal: Soft and nontender. No distention.  Genitourinary: Scant blood-tinged urine noted at the urethral os Musculoskeletal: No lower extremity tenderness nor edema. No gross deformities of extremities. Neurologic:  Normal speech and language. No gross focal neurologic deficits are appreciated.  Skin:  Skin is warm, dry and intact. Psychiatric: Mood and affect are normal. Speech and behavior are  normal. ____________________________________________   LABS (all labs ordered are listed, but only abnormal results are displayed)  Labs Reviewed  URINALYSIS, COMPLETE (UACMP) WITH MICROSCOPIC - Abnormal; Notable for the following components:      Result Value   Color, Urine YELLOW (*)    APPearance CLOUDY (*)    Hgb urine dipstick LARGE (*)    Protein, ur 30 (*)    Bacteria, UA RARE (*)    All other components within normal limits  CBC - Abnormal; Notable for the following components:   RBC 2.58 (*)    Hemoglobin 10.1 (*)    HCT 30.4 (*)    MCV 117.8 (*)    MCH 39.1 (*)    RDW 17.1 (*)    Platelets 131 (*)    All other components within normal limits  COMPREHENSIVE METABOLIC PANEL - Abnormal; Notable for the following components:   Sodium 133 (*)    CO2 21 (*)    Glucose, Bld 176 (*)    BUN 43 (*)    Creatinine, Ser 1.64 (*)    GFR calc non Af Amer 38 (*)    All other components within normal limits    RADIOLOGY I, [facility], personally viewed and evaluated these images (plain radiographs) as part of my medical decision making, as well as reviewing the written report by the radiologist.    Official radiology report(s): CT Renal Stone Study  Result Date: 07/26/2020 CLINICAL DATA:  Flank pain with kidney stone suspected EXAM: CT ABDOMEN AND PELVIS WITHOUT CONTRAST TECHNIQUE: Multidetector CT imaging of the abdomen and pelvis was performed following the standard protocol without IV contrast. COMPARISON:  06/15/2020 FINDINGS: Lower chest: Coronary atherosclerosis. There has been CABG. Known Peri fissural nodule inferiorly on the right which measures up to 9 mm in length. Hepatobiliary: Upper central hepatic cystic density.Cholecystectomy without bile duct dilatation. Pancreas: Fatty atrophy. Spleen: Unremarkable. Adrenals/Urinary Tract: Negative adrenals. No hydronephrosis or ureteral stone. Bilateral renal hilar calcifications which appear atherosclerotic. Bilateral  lower pole renal cystic densities. Decompressed bladder by a Foley catheter. Perivesicular fat stranding is seen in the interim. Stomach/Bowel:  No obstruction. No visible bowel inflammation. Vascular/Lymphatic: No acute vascular abnormality. Extensive atherosclerotic calcification with prior aorto bi-iliac bypass. No mass or adenopathy. Fatty left inguinal hernia which is partially covered. Reproductive:Symmetrically enlarged prostate. Other: No ascites or pneumoperitoneum. Musculoskeletal: No acute abnormalities. Generalized degenerative disease with dextroscoliosis. T12-L1 incomplete segmentation. IMPRESSION: 1. Pelvic fat stranding which encompasses the thick  walled bladder. This may be related to cystitis or recently corrected urinary retention. The Foley catheter is in good position with complete collapse the bladder. 2. Chronic findings are stable from study 2 weeks ago and described above. Electronically Signed   By: Monte Fantasia M.D.   On: 07/26/2020 06:13    Procedures   ____________________________________________   INITIAL IMPRESSION / MDM / ASSESSMENT AND PLAN / ED COURSE  As part of my medical decision making, I reviewed the following data within the electronic MEDICAL RECORD [id number] year old male presented with above-stated history and physical exam consistent with urinary retention status post transurethral bladder tumor resection.  Bladder scan revealed 420 mL of urine and as such Foley catheter was inserted by me with 500 mL of urine obtained.  Patient admits to complete resolution of previous discomfort.  Laboratory data stable in comparison to baseline.  Patient discussed with Dr. Erlene Quan who agrees with treatment and plan for patient to follow-up with Dr. Bernardo Heater in office.  Foley catheter left in place ____________________________________________  FINAL CLINICAL IMPRESSION(S) / ED DIAGNOSES  Final diagnoses:  Urinary retention     MEDICATIONS GIVEN DURING THIS  VISIT:  Medications  lidocaine (XYLOCAINE) 2 % jelly 1 application (1 application Urethral Given 07/26/20 0458)     ED Discharge Orders    None      *Please note:  DAVI KROON was evaluated in Emergency Department on 07/26/2020 for the symptoms described in the history of present illness. He was evaluated in the context of the global COVID-19 pandemic, which necessitated consideration that the patient might be at risk for infection with the SARS-CoV-2 virus that causes COVID-19. Institutional protocols and algorithms that pertain to the evaluation of patients at risk for COVID-19 are in a state of rapid change based on information released by regulatory bodies including the CDC and federal and state organizations. These policies and algorithms were followed during the patient's care in the ED.  Some ED evaluations and interventions may be delayed as a result of limited staffing during and after the pandemic.*  Note:  This document was prepared using Dragon voice recognition software and may include unintentional dictation errors.   Gregor Hams, MD 07/26/20 [phone]

## 2020-07-26 NOTE — Discharge Instructions (Signed)
Please follow-up with Dr. Bernardo Heater again.  Give his office a call later today and let them know that she had to have the catheter put back in.  He will schedule a removal time.  Should be about a week.  Additionally let him know that you have a little bladder infection and I have put you on Keflex.  He may want to change the antibiotic.  Please return for any increasing pain fever vomiting or other problems.

## 2020-07-26 NOTE — ED Notes (Signed)
Called lab to run specimens

## 2020-07-26 NOTE — ED Triage Notes (Addendum)
Pt to triage via w/c, appears uncomfortable; TUR performed on Tuesday for bladder tumor; pt with bladder pain since midnight; voided last at midnight with some blood noted

## 2020-07-26 NOTE — ED Notes (Signed)
PT urine bag changed to leg bag per Dr. Owens Shark

## 2020-07-29 ENCOUNTER — Encounter: Payer: Self-pay | Admitting: Urology

## 2020-07-29 NOTE — Progress Notes (Signed)
Virtual Visit via Telephone Note  I connected with Brandon Lewis on 07/27/20 at  3:00 PM EDT by telephone and verified that I am speaking with the correct person using two identifiers.  Location: Patient: Home Provider: [facility]   I discussed the limitations, risks, security and privacy concerns of performing an evaluation and management service by telephone and the availability of in person appointments. I also discussed with the patient that there may be a patient responsible charge related to this service. The patient expressed understanding and agreed to proceed.   History of Present Illness: 86 y.o. male status post TURBT 07/24/2020 for a papillary bladder tumor.   Catheter removed in office 07/25/2020  Initially voided after catheter removal however later that evening presented to ED and urinary retention and catheter was replaced with a 500 mL of clear urine obtained   No complaints at present  Pathology high-grade noninvasive urothelial carcinoma; possible focal invasion lamina propria   Observations/Objective: N/A  Assessment and Plan:  1.  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  We discussed potential side effects with the most common being irritative voiding symptoms.  The possibility of flulike symptoms and rarely BCG sepsis was also discussed  All questions were answered and he desires to schedule  2.  Postop urinary retention   Follow Up Instructions:   Start BCG approximately 1 month  Follow-up visit early next week for catheter removal/voiding trial   I discussed the assessment and treatment plan with the patient. The patient was provided an opportunity to ask questions and all were answered. The patient agreed with the plan and demonstrated an understanding of the instructions.   The patient was  advised to call back or seek an in-person evaluation if the symptoms worsen or if the condition fails to improve as anticipated.  I provided 25 minutes of non-face-to-face time during this encounter.   Abbie Sons, MD

## 2020-08-02 ENCOUNTER — Telehealth: Payer: Self-pay

## 2020-08-02 ENCOUNTER — Ambulatory Visit: Payer: Medicare PPO | Admitting: Physician Assistant

## 2020-08-02 ENCOUNTER — Ambulatory Visit (INDEPENDENT_AMBULATORY_CARE_PROVIDER_SITE_OTHER): Payer: Medicare PPO | Admitting: Physician Assistant

## 2020-08-02 ENCOUNTER — Encounter: Payer: Self-pay | Admitting: Physician Assistant

## 2020-08-02 ENCOUNTER — Other Ambulatory Visit: Payer: Self-pay

## 2020-08-02 VITALS — BP 122/66 | HR 94 | Ht 70.0 in | Wt 193.0 lb

## 2020-08-02 DIAGNOSIS — C679 Malignant neoplasm of bladder, unspecified: Secondary | ICD-10-CM

## 2020-08-02 DIAGNOSIS — R339 Retention of urine, unspecified: Secondary | ICD-10-CM

## 2020-08-02 NOTE — Progress Notes (Signed)
Catheter Removal  Patient is present today for a catheter removal.  73ml of water was drained from the balloon. A 16FR foley cath was removed from the bladder no complications were noted . Patient tolerated well.  Performed by: Bradly Bienenstock, CMA  Follow up/ Additional notes: RTC this afternoon for PVR

## 2020-08-02 NOTE — Telephone Encounter (Signed)
-----   Message from Abbie Sons, MD sent at 07/29/2020 11:28 AM EDT ----- Regarding: BCG induction Please schedule BCG induction weekly x6 starting in approximately 1 month.  Thanks

## 2020-08-02 NOTE — Telephone Encounter (Signed)
Patient present in clinic today will discuss BCG instructions and dates given

## 2020-08-02 NOTE — Progress Notes (Signed)
08/02/2020 12:59 PM   Brandon Lewis [birth year] [id number]  CC: Chief Complaint  Patient presents with  . Urinary Retention   HPI: Brandon Lewis is a 86 y.o. male with high-grade urothelial carcinoma who developed urinary retention following TURBT who presents today for voiding trial.  Today he reports feeling well with no acute concerns.  He is scheduled to begin induction BCG next month.  PMH: Past Medical History:  Diagnosis Date  . CAD (coronary artery disease)    s/p PCI after presenting with exertional angina (drug eluting stent)  . Diverticulosis   . GERD (gastroesophageal reflux disease)   . HTN (hypertension)   . Hyperlipidemia   . Myocardial infarction ([place]) 09/2019   bypass and stent placed  . Osteoarthritis   . PVD (peripheral vascular disease) ([place])    s/p aortobifemoral bypass, with bilateral SFA occlusion and intermittent claudication   . Small bowel obstruction [facility])     Surgical History: Past Surgical History:  Procedure Laterality Date  . aorto-bifem  2003   [facility]   . CAROTID STENT     [facility]  . CATARACT EXTRACTION, BILATERAL  2017  . CHOLECYSTECTOMY  2002  . CORONARY ARTERY BYPASS GRAFT N/A 09/16/2019   Procedure: CORONARY ARTERY BYPASS GRAFTING (CABG) using LIMA to LAD; Endoscopic right saphenous vein harvest to OM1, Diag1, and RCA.;  Surgeon: Wonda Olds, MD;  Location: [place];  Service: Open Heart Surgery;  Laterality: N/A;  . LEFT HEART CATH AND CORONARY ANGIOGRAPHY N/A 09/12/2019   Procedure: LEFT HEART CATH AND CORONARY ANGIOGRAPHY;  Surgeon: Corey Skains, MD;  Location: [place] CV LAB;  Service: Cardiovascular;  Laterality: N/A;  . TEE WITHOUT CARDIOVERSION N/A 09/16/2019   Procedure: TRANSESOPHAGEAL ECHOCARDIOGRAM (TEE);  Surgeon: Wonda Olds, MD;  Location: [place];  Service: Open Heart Surgery;  Laterality: N/A;  . TONSILLECTOMY    . TRANSURETHRAL RESECTION OF BLADDER TUMOR WITH MITOMYCIN-C N/A 07/24/2020     Procedure: TRANSURETHRAL RESECTION OF BLADDER TUMOR WITH Gemcitabine;  Surgeon: Abbie Sons, MD;  Location: [facility];  Service: Urology;  Laterality: N/A;  . WISDOM TOOTH EXTRACTION      Home Medications:  Allergies as of 08/02/2020      Reactions   Cilostazol Other (See Comments)   REACTION: stomach problems- constipation   Simvastatin Other (See Comments)   REACTION: myalgia      Medication List       Accurate as of August 02, 2020 12:59 PM. If you have any questions, ask your nurse or doctor.        aspirin 81 MG chewable tablet Chew 1 tablet (81 mg total) by mouth daily.   cephALEXin 500 MG capsule Commonly known as: KEFLEX Take 1 capsule (500 mg total) by mouth 4 (four) times daily for 10 days.   cholecalciferol 1000 units tablet Commonly known as: VITAMIN D Take 1,000 Units by mouth at bedtime.   clopidogrel 75 MG tablet Commonly known as: PLAVIX TAKE ONE TABLET BY MOUTH EVERY DAY *BOTTLE* What changed:   how much to take  how to take this  when to take this  additional instructions   HYDROcodone-acetaminophen 5-325 MG tablet Commonly known as: NORCO/VICODIN Take 0.5-1 tablets by mouth every 6 (six) hours as needed for moderate pain.   Magnesium 400 MG Caps Take 400 mg by mouth every morning.   metoprolol succinate 50 MG 24 hr tablet Commonly known as: TOPROL-XL **VIAL ONLY** TAKE (1) TABLET BY MOUTH ONCE  A DAY.DO NOT CRUSH. (HIGHBLOOD PRESSURE) *BOTTLE* What changed:   how much to take  how to take this  when to take this  additional instructions   multivitamin with minerals Tabs tablet Take 1 tablet by mouth every morning.   pantoprazole 40 MG tablet Commonly known as: PROTONIX Take 40 mg by mouth daily.   rosuvastatin 10 MG tablet Commonly known as: CRESTOR **VIAL ONLY** TAKE ONE TABLET BY MOUTH AT BEDTIME. (IMPROVES CHOLESTEROL) What changed:   how much to take  how to take this  when to take this  additional  instructions   sodium chloride inhaler solution Commonly known as: BRONCHO SALINE Take 1 spray by nebulization daily as needed (congestion).   tamsulosin 0.4 MG Caps capsule Commonly known as: FLOMAX TAKE 1 CAPSULE BY MOUTH ONCE DAILY What changed: when to take this       Allergies:  Allergies  Allergen Reactions  . Cilostazol Other (See Comments)    REACTION: stomach problems- constipation  . Simvastatin Other (See Comments)    REACTION: myalgia    Family History: Family History  Problem Relation Age of Onset  . Diabetes Father        DM - and brother   . Stroke Father   . Coronary artery disease Paternal Grandfather   . Colon cancer Neg Hx   . Prostate cancer Neg Hx     Social History:   reports that he quit smoking about 27 years ago. His smoking use included cigarettes. He has a 20.00 pack-year smoking history. He has never used smokeless tobacco. He reports previous alcohol use. He reports that he does not use drugs.  Physical Exam: BP 122/66 (BP Location: Left Arm, Patient Position: Sitting, Cuff Size: Normal)   Pulse 94   Ht 5\' 10"  (1.778 m)   Wt 193 lb (87.5 kg)   BMI 27.69 kg/m   Constitutional:  Alert and oriented, no acute distress, nontoxic appearing HEENT: [place], AT Cardiovascular: No clubbing, cyanosis, or edema Respiratory: Normal respiratory effort, no increased work of breathing Skin: No rashes, bruises or suspicious lesions Neurologic: Grossly intact, no focal deficits, moving all 4 extremities Psychiatric: Normal mood and affect  Laboratory Data: Results for orders placed or performed in visit on 08/02/20  BLADDER SCAN AMB NON-IMAGING  Result Value Ref Range   Scan Result 71mL    Assessment & Plan:   1. Urinary retention Foley catheter removed in the morning, see separate procedure note for details. Patient returned to clinic this afternoon for repeat PVR.  He reports drinking approximately 40 ounces of fluid throughout the day.  He has been  able to urinate.  PVR 15 mL.  Voiding trial passed.  Counseled patient to return to clinic with recurrent retention symptoms.  He expressed understanding. - BLADDER SCAN AMB NON-IMAGING  2. Urothelial carcinoma of bladder ([place]) Discussed BCG post-instillation instructions with the patient today including holding the urine for 2 hours, adding bleach to the toilet with each void for the subsequent 6 hours, and typical symptoms of BCG treatment including fatigue, malaise, urgency, frequency, and dysuria.  All questions answered, written instructions also provided today.  Return if symptoms worsen or fail to improve.  Debroah Loop, PA-C  [facility] [address] [place], [place] [address] [phone]

## 2020-08-02 NOTE — Patient Instructions (Signed)

## 2020-08-03 ENCOUNTER — Ambulatory Visit: Payer: Medicare PPO | Admitting: Physician Assistant

## 2020-08-03 LAB — BLADDER SCAN AMB NON-IMAGING

## 2020-08-06 ENCOUNTER — Telehealth: Payer: Self-pay | Admitting: Internal Medicine

## 2020-08-06 DIAGNOSIS — C679 Malignant neoplasm of bladder, unspecified: Secondary | ICD-10-CM

## 2020-08-06 NOTE — Telephone Encounter (Signed)
I called him to discuss the biopsy. High grade lesion and not clearly confined to bladder Recommended oncology evaluation to be sure no action needed besides the BCG infusions

## 2020-08-09 ENCOUNTER — Other Ambulatory Visit: Payer: Self-pay

## 2020-08-09 ENCOUNTER — Encounter: Payer: Self-pay | Admitting: Oncology

## 2020-08-09 ENCOUNTER — Inpatient Hospital Stay: Payer: Medicare PPO

## 2020-08-09 ENCOUNTER — Inpatient Hospital Stay: Payer: Medicare PPO | Attending: Oncology | Admitting: Oncology

## 2020-08-09 VITALS — BP 125/82 | HR 82 | Temp 96.8°F | Resp 18 | Wt 193.4 lb

## 2020-08-09 DIAGNOSIS — C679 Malignant neoplasm of bladder, unspecified: Secondary | ICD-10-CM

## 2020-08-09 DIAGNOSIS — Z79899 Other long term (current) drug therapy: Secondary | ICD-10-CM

## 2020-08-09 DIAGNOSIS — C671 Malignant neoplasm of dome of bladder: Secondary | ICD-10-CM | POA: Diagnosis not present

## 2020-08-09 DIAGNOSIS — I10 Essential (primary) hypertension: Secondary | ICD-10-CM | POA: Diagnosis not present

## 2020-08-09 DIAGNOSIS — Z7982 Long term (current) use of aspirin: Secondary | ICD-10-CM | POA: Diagnosis not present

## 2020-08-09 DIAGNOSIS — Z87891 Personal history of nicotine dependence: Secondary | ICD-10-CM | POA: Insufficient documentation

## 2020-08-09 DIAGNOSIS — I2510 Atherosclerotic heart disease of native coronary artery without angina pectoris: Secondary | ICD-10-CM | POA: Diagnosis not present

## 2020-08-09 DIAGNOSIS — I252 Old myocardial infarction: Secondary | ICD-10-CM | POA: Insufficient documentation

## 2020-08-09 NOTE — Progress Notes (Addendum)
Hematology/Oncology Consult note [facility] [phone] [phone]   Patient Care Team: Venia Carbon, MD as PCP - General Corey Skains, MD as PCP - Cardiology (Cardiology)  REFERRING PROVIDER: Venia Carbon, MD  CHIEF COMPLAINTS/REASON FOR VISIT:  Evaluation of bladder cancer  HISTORY OF PRESENTING ILLNESS:   Brandon Lewis is a  86 y.o.  male with PMH listed below was seen in consultation at the request of  Venia Carbon, MD  for evaluation of bladder cancer  Patient recently had hematuria and had a urology work-up. 06/15/2020, CT hematuria work-up 18 x 13 x 8 mm polypoid enhancing soft tissue lesion along the posterior bladder dome, highly suspicious for urothelial neoplasm No evidence of metastatic disease in the abdomen or pelvis.  Hepatic and renal cyst.  Left groin hernia. Status post aorto bifemoral bypass graft.  9 mm perifissural nodule in the right middle lobe stable since chest CT in 09/14/2019.  Atherosclerosis. 07/26/2020 CT renal stone study showed pelvic fat stranding which encompasses the thick-walled bladder.  This may be related to cystitis or recently corrected urinary retention.  Foley cath was in good position with complete collapse bladder.  Chronic findings are stable  Patient had a cystoscopy done on 07/04/2020 which showed posterior wall papillary bladder lesion consistent with urothelial carcinoma. 07/24/2020,TURP 2 x 2.5cm Papillary urothelia carcinoma, high grade, focally suspicious for superficial invasion of lamina propria. Muscularis propria is not present for evaluation.  Bladder tumor base biopsy showed non invasive papillary urothelial carcinoma, high-grade.  Muscularis propria is present and negative for tumor. Pathology was discussed by Dr. Bernardo Heater with patient.  High risk, he was recommended to have intravesical therapy with BCG Patient prefers to have oncology evaluation at the request of  primary care provider to refer him to establish care with me. Today he was accompanied by his son.  He has hearing deficiency. Today he denies any pain.  He has had pink color urine for the past days which gradually becomes clear.  No hematuria.  Denies any fever, chills.  No unintentional weight loss Review of Systems  Constitutional: Negative for chills, fatigue and fever.  HENT:   Positive for hearing loss. Negative for voice change.   Eyes: Negative for eye problems and icterus.  Respiratory: Negative for chest tightness, cough and shortness of breath.   Cardiovascular: Negative for chest pain and leg swelling.  Gastrointestinal: Negative for abdominal distention and abdominal pain.  Endocrine: Negative for hot flashes.  Genitourinary: Negative for difficulty urinating, dysuria and frequency.   Musculoskeletal: Negative for arthralgias.  Skin: Negative for itching and rash.  Neurological: Negative for light-headedness and numbness.  Hematological: Negative for adenopathy. Does not bruise/bleed easily.  Psychiatric/Behavioral: Negative for confusion.    MEDICAL HISTORY:  Past Medical History:  Diagnosis Date  . Bladder cancer ([place])   . CAD (coronary artery disease)    s/p PCI after presenting with exertional angina (drug eluting stent)  . Diverticulosis   . GERD (gastroesophageal reflux disease)   . HTN (hypertension)   . Hyperlipidemia   . Myocardial infarction ([place]) 09/2019   bypass and stent placed  . Osteoarthritis   . PVD (peripheral vascular disease) ([place])    s/p aortobifemoral bypass, with bilateral SFA occlusion and intermittent claudication   . Small bowel obstruction ([place])     SURGICAL HISTORY: Past Surgical History:  Procedure Laterality Date  . aorto-bifem  2003   [facility]   . CAROTID STENT  [place]  . CATARACT EXTRACTION, BILATERAL  2017  . CHOLECYSTECTOMY  2002  . CORONARY ARTERY BYPASS GRAFT N/A 09/16/2019   Procedure: CORONARY ARTERY BYPASS GRAFTING  (CABG) using LIMA to LAD; Endoscopic right saphenous vein harvest to OM1, Diag1, and RCA.;  Surgeon: Wonda Olds, MD;  Location: [facility];  Service: Open Heart Surgery;  Laterality: N/A;  . LEFT HEART CATH AND CORONARY ANGIOGRAPHY N/A 09/12/2019   Procedure: LEFT HEART CATH AND CORONARY ANGIOGRAPHY;  Surgeon: Corey Skains, MD;  Location: [facility];  Service: Cardiovascular;  Laterality: N/A;  . TEE WITHOUT CARDIOVERSION N/A 09/16/2019   Procedure: TRANSESOPHAGEAL ECHOCARDIOGRAM (TEE);  Surgeon: Wonda Olds, MD;  Location: [facility];  Service: Open Heart Surgery;  Laterality: N/A;  . TONSILLECTOMY    . TRANSURETHRAL RESECTION OF BLADDER TUMOR WITH MITOMYCIN-C N/A 07/24/2020   Procedure: TRANSURETHRAL RESECTION OF BLADDER TUMOR WITH Gemcitabine;  Surgeon: Abbie Sons, MD;  Location: [facility];  Service: Urology;  Laterality: N/A;  . WISDOM TOOTH EXTRACTION      SOCIAL HISTORY: Social History   Socioeconomic History  . Marital status: Married    Spouse name: Not on file  . Number of children: 3  . Years of education: Not on file  . Highest education level: Not on file  Occupational History  . Occupation: Landscape architect    Comment: Retired  Tobacco Use  . Smoking status: Former Smoker    Packs/day: 1.00    Years: 20.00    Pack years: 20.00    Types: Cigarettes    Quit date: 10/20/1992    Years since quitting: 27.8  . Smokeless tobacco: Never Used  . Tobacco comment: quit in 1994   Substance and Sexual Activity  . Alcohol use: Not Currently    Comment: rare  . Drug use: No  . Sexual activity: Not on file  Other Topics Concern  . Not on file  Social History Narrative   Widowed; then remarried; 3 sons.      Has a living will - wife has health care POA    Son [name] would be alternate   Would want resuscitation attempts   Would accept brief trial of artificial nutrition      Pt signed designated party release form and gives [place] [place] [phone]  (home #), access to medical records. Can also leave msg on home answering machine. Cell # [phone]   Social Determinants of Health   Financial Resource Strain:   . Difficulty of Paying Living Expenses: Not on file  Food Insecurity:   . Worried About Charity fundraiser in the Last Year: Not on file  . Ran Out of Food in the Last Year: Not on file  Transportation Needs:   . Lack of Transportation (Medical): Not on file  . Lack of Transportation (Non-Medical): Not on file  Physical Activity:   . Days of Exercise per Week: Not on file  . Minutes of Exercise per Session: Not on file  Stress:   . Feeling of Stress : Not on file  Social Connections:   . Frequency of Communication with Friends and Family: Not on file  . Frequency of Social Gatherings with Friends and Family: Not on file  . Attends Religious Services: Not on file  . Active Member of Clubs or Organizations: Not on file  . Attends Archivist Meetings: Not on file  . Marital Status: Not on file  Intimate Partner Violence:   .  Fear of Current or Ex-Partner: Not on file  . Emotionally Abused: Not on file  . Physically Abused: Not on file  . Sexually Abused: Not on file    FAMILY HISTORY: Family History  Problem Relation Age of Onset  . Diabetes Father        DM - and brother   . Stroke Father   . Coronary artery disease Paternal Grandfather   . Colon cancer Neg Hx   . Prostate cancer Neg Hx     ALLERGIES:  is allergic to cilostazol and simvastatin.  MEDICATIONS:  Current Outpatient Medications  Medication Sig Dispense Refill  . aspirin 81 MG chewable tablet Chew 1 tablet (81 mg total) by mouth daily.    . cholecalciferol (VITAMIN D) 1000 UNITS tablet Take 1,000 Units by mouth at bedtime.     . clopidogrel (PLAVIX) 75 MG tablet TAKE ONE TABLET BY MOUTH EVERY DAY *BOTTLE* (Patient taking differently: Take 75 mg by mouth daily. ) 90 tablet 3  . Magnesium 400 MG CAPS Take 400 mg by mouth every morning.       . metoprolol succinate (TOPROL-XL) 50 MG 24 hr tablet **VIAL ONLY** TAKE (1) TABLET BY MOUTH ONCE A DAY.DO NOT CRUSH. (HIGHBLOOD PRESSURE) *BOTTLE* (Patient taking differently: Take 50 mg by mouth every morning. ) 30 tablet 11  . Multiple Vitamin (MULTIVITAMIN WITH MINERALS) TABS tablet Take 1 tablet by mouth every morning.    . rosuvastatin (CRESTOR) 10 MG tablet **VIAL ONLY** TAKE ONE TABLET BY MOUTH AT BEDTIME. (IMPROVES CHOLESTEROL) (Patient taking differently: Take 10 mg by mouth every morning. ) 90 tablet 3  . sodium chloride (BRONCHO SALINE) inhaler solution Take 1 spray by nebulization daily as needed (congestion).     . tamsulosin (FLOMAX) 0.4 MG CAPS capsule TAKE 1 CAPSULE BY MOUTH ONCE DAILY (Patient taking differently: Take 0.4 mg by mouth daily after breakfast. ) 30 capsule 11  . pantoprazole (PROTONIX) 40 MG tablet Take 40 mg by mouth daily. (Patient not taking: Reported on 08/09/2020)     No current facility-administered medications for this visit.     PHYSICAL EXAMINATION: ECOG PERFORMANCE STATUS: 1 - Symptomatic but completely ambulatory Vitals:   08/09/20 1141  BP: 125/82  Pulse: 82  Resp: 18  Temp: (!) 96.8 F (36 C)  SpO2: 99%   Filed Weights   08/09/20 1141  Weight: 193 lb 6.4 oz (87.7 kg)    Physical Exam  LABORATORY DATA:  I have reviewed the data as listed Lab Results  Component Value Date   WBC 6.8 07/26/2020   HGB 10.1 (L) 07/26/2020   HCT 30.4 (L) 07/26/2020   MCV 117.8 (H) 07/26/2020   PLT 131 (L) 07/26/2020   Recent Labs    09/22/19 0411 09/23/19 0500 09/27/19 0445 09/27/19 0445 09/29/19 0315 09/29/19 0315 10/01/19 0327 10/12/19 1212 11/16/19 1206 05/17/20 1526 07/26/20 0541  NA 139   < > 139   < > 139   < > 139   < > 136 139 133*  K 4.0   < > 3.9   < > 4.4   < > 4.7   < > 4.6 4.4 4.4  CL 108   < > 105   < > 109   < > 110   < > 105 106 104  CO2 23   < > 22   < > 22   < > 21*   < > 25 27 21*  GLUCOSE 132*   < >  221*   < > 141*   <  > 128*   < > 115* 99 176*  BUN 53*   < > 41*   < > 36*   < > 30*   < > 29* 24* 43*  CREATININE 1.74*   < > 1.82*   < > 1.71*   < > 1.61*   < > 1.61* 1.54* 1.64*  CALCIUM 8.6*   < > 8.5*   < > 8.7*   < > 8.9   < > 9.4 9.4 9.0  GFRNONAA 35*   < > 34*   < > 36*  --  39*  --   --   --  38*  GFRAA 41*   < > 39*  --  42*  --  45*  --   --   --   --   PROT 5.5*  --   --   --   --   --   --   --   --  7.2 7.5  ALBUMIN 2.3*  --   --   --   --   --   --    < > 3.7 3.9  3.9 3.5  AST 34  --   --   --   --   --   --   --   --  20 27  ALT 26  --   --   --   --   --   --   --   --  11 15  ALKPHOS 46  --   --   --   --   --   --   --   --  72 65  BILITOT 0.9  --   --   --   --   --   --   --   --  0.7 1.2  BILIDIR  --   --   --   --   --   --   --   --   --  0.1  --    < > = values in this interval not displayed.   Iron/TIBC/Ferritin/ %Sat    Component Value Date/Time   IRON 60 09/13/2019 0311   TIBC 244 (L) 09/13/2019 0311   FERRITIN 135 09/13/2019 0311   IRONPCTSAT 25 09/13/2019 0311      RADIOGRAPHIC STUDIES: I have personally reviewed the radiological images as listed and agreed with the findings in the report. CT Renal Stone Study  Result Date: 07/26/2020 CLINICAL DATA:  Flank pain with kidney stone suspected EXAM: CT ABDOMEN AND PELVIS WITHOUT CONTRAST TECHNIQUE: Multidetector CT imaging of the abdomen and pelvis was performed following the standard protocol without IV contrast. COMPARISON:  06/15/2020 FINDINGS: Lower chest: Coronary atherosclerosis. There has been CABG. Known Peri fissural nodule inferiorly on the right which measures up to 9 mm in length. Hepatobiliary: Upper central hepatic cystic density.Cholecystectomy without bile duct dilatation. Pancreas: Fatty atrophy. Spleen: Unremarkable. Adrenals/Urinary Tract: Negative adrenals. No hydronephrosis or ureteral stone. Bilateral renal hilar calcifications which appear atherosclerotic. Bilateral lower pole renal cystic densities.  Decompressed bladder by a Foley catheter. Perivesicular fat stranding is seen in the interim. Stomach/Bowel:  No obstruction. No visible bowel inflammation. Vascular/Lymphatic: No acute vascular abnormality. Extensive atherosclerotic calcification with prior aorto bi-iliac bypass. No mass or adenopathy. Fatty left inguinal hernia which is partially covered. Reproductive:Symmetrically enlarged prostate. Other: No ascites or pneumoperitoneum. Musculoskeletal: No acute abnormalities. Generalized degenerative disease with dextroscoliosis. T12-L1  incomplete segmentation. IMPRESSION: 1. Pelvic fat stranding which encompasses the thick walled bladder. This may be related to cystitis or recently corrected urinary retention. The Foley catheter is in good position with complete collapse the bladder. 2. Chronic findings are stable from study 2 weeks ago and described above. Electronically Signed   By: Monte Fantasia M.D.   On: 07/26/2020 06:13      ASSESSMENT & PLAN:  1. 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 high-grade T1 lesion- AUA  high risk risk NMIBC, I reviewed NCCN guideline recommendation with him.  I agree with Dr. Bernardo Heater that he will need BCG treatments. I recommend patient to continue follow-up with Dr. Bernardo Heater for the treatment management. He does not need to follow-up with medical oncology at this point.  No need for IV chemotherapy treatments. He may reestablish care if he develops muscle invasive bladder cancer or metastatic disease.  All questions were answered. The patient knows to call the clinic with any problems questions or concerns.  Thank you for this kind referral and the opportunity to participate in the care of this patient. A copy of today's note is routed to referring provider    Earlie Server, MD, PhD Hematology Oncology [facility] at [facility] Pager- [phone] 08/09/2020

## 2020-08-09 NOTE — Progress Notes (Signed)
Patient here for oncology inital appointment, expresses concerns of urinary frequency.

## 2020-08-10 ENCOUNTER — Telehealth: Payer: Self-pay | Admitting: Internal Medicine

## 2020-08-10 NOTE — Telephone Encounter (Signed)
Patient came in to office and dropped off an envelope for Dr.Letvak.  He said it was a correction for his chart. Envelope is in rx tower.

## 2020-08-13 NOTE — Telephone Encounter (Signed)
That was a typo in Dr Collie Siad note There is no controversy that he needs the BCG----Dr Tasia Catchings would have to correct the note though (states "he will not need BCG treatments)

## 2020-08-13 NOTE — Telephone Encounter (Signed)
Dr.Letvak, I already corrected the typo created by dragon dictator last Friday. He needs intravesical BCG and will continue to follow up with Dr.Stoiff.  Thanks for the update.   Talbert Cage

## 2020-08-13 NOTE — Telephone Encounter (Signed)
Thanks for your help. Again, I wanted to be sure that was all he needed (I have had some folks wind up with metastatic bladder cancer)

## 2020-08-13 NOTE — Telephone Encounter (Signed)
Letter placed in Dr Alla German inbox on his desk.

## 2020-08-22 ENCOUNTER — Inpatient Hospital Stay: Payer: Medicare PPO | Attending: Oncology | Admitting: Hospice and Palliative Medicine

## 2020-08-22 DIAGNOSIS — C679 Malignant neoplasm of bladder, unspecified: Secondary | ICD-10-CM

## 2020-08-22 NOTE — Progress Notes (Signed)
Multidisciplinary Oncology Council Documentation  Brandon Lewis was presented by our [facility] on 08/22/2020, which included representatives from:  . Palliative Care . Dietitian . Physical/Occupational Therapist . Speech Therapist . Survivorship Nurse . Nurse Navigator    Phuoc currently presents with history of bladder cancer. No plan for follow up in [place].   We reviewed previous medical and familial history, history of present illness, and recent lab results along with all available histopathologic and imaging studies. The [place] considered available treatment options and made the following recommendations/referrals: No orders of the defined types were placed in this encounter.   The MOC is a meeting of clinicians from various specialty areas who evaluate and discuss patients for whom a multidisciplinary approach is being considered. Final determinations in the plan of care are those of the provider(s).   Today's extended care, comprehensive team conference, Brandon Lewis was not present for the discussion and was not examined.

## 2020-08-29 DIAGNOSIS — E785 Hyperlipidemia, unspecified: Secondary | ICD-10-CM | POA: Diagnosis not present

## 2020-08-29 DIAGNOSIS — I10 Essential (primary) hypertension: Secondary | ICD-10-CM | POA: Diagnosis not present

## 2020-08-29 DIAGNOSIS — I25119 Atherosclerotic heart disease of native coronary artery with unspecified angina pectoris: Secondary | ICD-10-CM | POA: Diagnosis not present

## 2020-08-29 DIAGNOSIS — I739 Peripheral vascular disease, unspecified: Secondary | ICD-10-CM | POA: Diagnosis not present

## 2020-08-29 DIAGNOSIS — I712 Thoracic aortic aneurysm, without rupture: Secondary | ICD-10-CM | POA: Diagnosis not present

## 2020-08-29 DIAGNOSIS — I4892 Unspecified atrial flutter: Secondary | ICD-10-CM | POA: Diagnosis not present

## 2020-08-29 DIAGNOSIS — Z951 Presence of aortocoronary bypass graft: Secondary | ICD-10-CM | POA: Diagnosis not present

## 2020-08-31 ENCOUNTER — Other Ambulatory Visit: Payer: Self-pay

## 2020-08-31 ENCOUNTER — Ambulatory Visit: Payer: Medicare PPO | Admitting: Podiatry

## 2020-08-31 ENCOUNTER — Encounter: Payer: Self-pay | Admitting: Podiatry

## 2020-08-31 DIAGNOSIS — B351 Tinea unguium: Secondary | ICD-10-CM

## 2020-08-31 DIAGNOSIS — M79676 Pain in unspecified toe(s): Secondary | ICD-10-CM | POA: Diagnosis not present

## 2020-08-31 DIAGNOSIS — L989 Disorder of the skin and subcutaneous tissue, unspecified: Secondary | ICD-10-CM

## 2020-08-31 NOTE — Progress Notes (Signed)
    Subjective: Patient is a 86 y.o. male presenting to the office today for follow up evaluation of painful callus lesion(s) noted to the bilateral feet. Walking and bearing weight increases the pain. He has not had any recent treatment for the symptoms.  Patient also complains of elongated, thickened nails that cause pain while ambulating in shoes. He is unable to trim his own nails. Patient presents today for further treatment and evaluation.  Past Medical History:  Diagnosis Date  . Bladder cancer (HCC)   . CAD (coronary artery disease)    s/p PCI after presenting with exertional angina (drug eluting stent)  . Diverticulosis   . GERD (gastroesophageal reflux disease)   . HTN (hypertension)   . Hyperlipidemia   . Myocardial infarction ([facility]) 09/2019   bypass and stent placed  . Osteoarthritis   . PVD (peripheral vascular disease) ([facility])    s/p aortobifemoral bypass, with bilateral SFA occlusion and intermittent claudication   . Small bowel obstruction ([facility])     Objective:  Physical Exam General: Alert and oriented x3 in no acute distress  Dermatology: Hyperkeratotic lesion(s) present on the bilateral feet. Pain on palpation with a central nucleated core noted. Skin is warm, dry and supple bilateral lower extremities. Negative for open lesions or macerations. Nails are tender, long, thickened and dystrophic with subungual debris, consistent with onychomycosis, 1-5 bilateral. No signs of infection noted.  Vascular: Palpable pedal pulses bilaterally. No edema or erythema noted. Capillary refill within normal limits.  Neurological: Epicritic and protective threshold grossly intact bilaterally.   Musculoskeletal Exam: Pain on palpation at the keratotic lesion(s) noted. Range of motion within normal limits bilateral. Muscle strength 5/5 in all groups bilateral.  Assessment: 1. Onychodystrophic nails 1-5 bilateral with hyperkeratosis of nails.  2. Onychomycosis of nail due to  dermatophyte bilateral 3. Pre-ulcerative callus lesions noted to the bilateral feet x 4   Plan of Care:  1. Patient evaluated. 2. Excisional debridement of keratoic lesion(s) using a chisel blade was performed without incident.  3. Dressed with light dressing. 4. Mechanical debridement of nails 1-5 bilaterally performed using a nail nipper. Filed with dremel without incident.  5. Patient is to return to the clinic in 3 months.   Alix Lahmann M. Nirvaan Frett, DPM [facility]  Dr. Melah Ebling M. Moises Terpstra, DPM    [address]                                        [place], [place] [address]                Office [phone]  Fax [phone]   

## 2020-09-04 ENCOUNTER — Ambulatory Visit (INDEPENDENT_AMBULATORY_CARE_PROVIDER_SITE_OTHER): Payer: Medicare PPO

## 2020-09-04 ENCOUNTER — Other Ambulatory Visit: Payer: Self-pay

## 2020-09-04 DIAGNOSIS — Z23 Encounter for immunization: Secondary | ICD-10-CM

## 2020-09-07 ENCOUNTER — Ambulatory Visit: Payer: Medicare PPO | Admitting: Physician Assistant

## 2020-09-10 ENCOUNTER — Ambulatory Visit (INDEPENDENT_AMBULATORY_CARE_PROVIDER_SITE_OTHER): Payer: Medicare PPO | Admitting: Physician Assistant

## 2020-09-10 ENCOUNTER — Ambulatory Visit: Payer: Medicare PPO | Admitting: Physician Assistant

## 2020-09-10 ENCOUNTER — Other Ambulatory Visit: Payer: Self-pay

## 2020-09-10 DIAGNOSIS — B3741 Candidal cystitis and urethritis: Secondary | ICD-10-CM

## 2020-09-10 DIAGNOSIS — C679 Malignant neoplasm of bladder, unspecified: Secondary | ICD-10-CM | POA: Diagnosis not present

## 2020-09-10 MED ORDER — FLUCONAZOLE 200 MG PO TABS: 200.0000 mg | ORAL_TABLET | Freq: Every day | ORAL | 0 refills | Status: DC

## 2020-09-10 NOTE — Progress Notes (Signed)
Patient presented to the clinic today for a scheduled BCG instillation.  Urine grossly infected with yeast visible on cath UA, culture pending.  Patient reports no dysuria.  Will treat for acute UTI today.  Prescription for diflucan 200mg  daily x14 days sent to pharmacy.  Patient to return to clinic next week for next scheduled BCG treatment.  We will add an additional treatment to his scheduled series to make up for today's missed instillation.  Debroah Loop, PA-C 09/10/20 2:43 PM

## 2020-09-11 LAB — URINALYSIS, COMPLETE
Bilirubin, UA: NEGATIVE
Glucose, UA: NEGATIVE
Ketones, UA: NEGATIVE
Nitrite, UA: NEGATIVE
Specific Gravity, UA: 1.025 (ref 1.005–1.030)
Urobilinogen, Ur: 0.2 mg/dL (ref 0.2–1.0)
pH, UA: 5.5 (ref 5.0–7.5)

## 2020-09-11 LAB — MICROSCOPIC EXAMINATION: WBC, UA: >30 /hpf — AB (ref 0–5)

## 2020-09-12 ENCOUNTER — Telehealth: Payer: Self-pay | Admitting: Physician Assistant

## 2020-09-12 NOTE — Telephone Encounter (Signed)
Please contact [facility] and request species identification on his recent yeast-positive urine culture as well as susceptibility testing against diflucan.

## 2020-09-12 NOTE — Telephone Encounter (Signed)
Per Titus Mould in [place] customer service, both test have been added; yeast ID and diflucan sensitivity.

## 2020-09-13 LAB — CULTURE, URINE COMPREHENSIVE

## 2020-09-17 ENCOUNTER — Other Ambulatory Visit: Payer: Self-pay

## 2020-09-17 ENCOUNTER — Ambulatory Visit (INDEPENDENT_AMBULATORY_CARE_PROVIDER_SITE_OTHER): Payer: Medicare PPO | Admitting: Physician Assistant

## 2020-09-17 ENCOUNTER — Ambulatory Visit: Payer: Medicare PPO | Admitting: Physician Assistant

## 2020-09-17 DIAGNOSIS — C679 Malignant neoplasm of bladder, unspecified: Secondary | ICD-10-CM | POA: Diagnosis not present

## 2020-09-17 MED ORDER — BCG LIVE 50 MG IS SUSR: 3.2400 mL | Freq: Once | INTRAVESICAL | Status: DC

## 2020-09-17 NOTE — Progress Notes (Signed)
Patient presented to the clinic today for a scheduled BCG instillation.  UA remains positive for pyuria and yeast, species and Diflucan susceptibility pending.  Patient reports slight increased frequency.  Counseled patient to continue Diflucan as prescribed.  Patient to return to clinic next week for next scheduled BCG treatment.  We will add an additional treatment to his scheduled series to make up for today's missed instillation.  Debroah Loop, PA-C 09/17/20 2:37 PM

## 2020-09-18 LAB — URINALYSIS, COMPLETE
Bilirubin, UA: NEGATIVE
Glucose, UA: NEGATIVE
Nitrite, UA: NEGATIVE
RBC, UA: NEGATIVE
Specific Gravity, UA: 1.025 (ref 1.005–1.030)
Urobilinogen, Ur: 0.2 mg/dL (ref 0.2–1.0)
pH, UA: 5.5 (ref 5.0–7.5)

## 2020-09-18 LAB — MICROSCOPIC EXAMINATION: WBC, UA: >30 /hpf — AB (ref 0–5)

## 2020-09-21 ENCOUNTER — Ambulatory Visit: Payer: Medicare PPO | Admitting: Physician Assistant

## 2020-09-21 LAB — SPECIMEN STATUS REPORT

## 2020-09-21 LAB — ORGANISM IDENTIFICATION, YEAST

## 2020-09-21 LAB — ANTIFUNGAL SUSCEP, FLUCONAZOLE: Fluconazole Islt MIC: 0.5

## 2020-09-24 ENCOUNTER — Ambulatory Visit (INDEPENDENT_AMBULATORY_CARE_PROVIDER_SITE_OTHER): Payer: Medicare PPO | Admitting: Physician Assistant

## 2020-09-24 ENCOUNTER — Other Ambulatory Visit: Payer: Self-pay

## 2020-09-24 ENCOUNTER — Ambulatory Visit: Payer: Medicare PPO | Admitting: Physician Assistant

## 2020-09-24 DIAGNOSIS — C679 Malignant neoplasm of bladder, unspecified: Secondary | ICD-10-CM

## 2020-09-24 DIAGNOSIS — D494 Neoplasm of unspecified behavior of bladder: Secondary | ICD-10-CM | POA: Diagnosis not present

## 2020-09-24 DIAGNOSIS — B3749 Other urogenital candidiasis: Secondary | ICD-10-CM

## 2020-09-24 MED ORDER — FLUCONAZOLE 200 MG PO TABS: 200.0000 mg | ORAL_TABLET | Freq: Every day | ORAL | 0 refills | Status: AC

## 2020-09-24 MED ORDER — BCG LIVE 50 MG IS SUSR
3.2400 mL | Freq: Once | INTRAVESICAL | Status: AC
  Administered 2020-09-24: 81 mg via INTRAVESICAL

## 2020-09-24 NOTE — Progress Notes (Signed)
BCG Bladder Instillation  BCG # 1 of 6  Due to Bladder Cancer patient is present today for a BCG treatment. Patient was cleaned and prepped in a sterile fashion with betadine. A 14FR catheter was inserted, urine return was noted 10ml, urine was yellow in color.  50ml of reconstituted BCG was instilled into the bladder. The catheter was then removed. Patient tolerated well, no complications were noted  Performed by: Burnis Halling, PA-C and Jessica Qualls, CMA  Additional notes: Patient remained in clinic for 30 minutes following instillation today for monitoring of hypersensitivity reaction; none noted.  We reviewed post-instillation instructions including holding the urine for 2 hours with quarter turns every 15 minutes and pouring bleach into the toilet with subsequent voids for 6 hours. Written instructions also provided today. He expressed understanding.   Follow up: 1 week for BCG #2 of 6   

## 2020-09-24 NOTE — Patient Instructions (Addendum)
Today's instructions: 3:25-5:25pm Hold your urine and do your quarter turns every 15 minutes.  5:25-11:25pm You may urinate. Every time that you urinate, make sure to pour 1/2 cup of bleach into the toilet with your urine. Allow the mixture to sit for 15 minutes before flushing.  11:25pm onward Resume your normal routine.   Additionally, I would like you to take Diflucan for one more week. I have sent this prescription to [place] today.  Patient Education: (BCG) Into the Bladder (Intravesical Chemotherapy)  BCG is a vaccine which is used to prevent tuberculosis (TB).  But it's also a helpful treatment for some early bladder cancers.  When BCG goes directly into the bladder the treatment is described as intravesical.  BCG is a type of immunotherapy.  Immunotherapy stimulates the body's immune system to destroy cancer cells.  How it's given BCG treatment is given to you in an outpatient setting.  It takes a few minutes to administer and you can go home as soon as it's finished.  It might be a good idea to ask someone to bring you, particularly the fist time.  Unlike chemotherapy into the bladder, BCG treatment is never given immediately after surgery to remove bladder tumors.  There needs to be a delay usually of at least two weeks after surgery, before you can have it.  You won't be given treatment with BCG if you are unwell or have an infection in your urine.  You're usually asked to limit the amount you drink before your treatment.  This will help to increase the concentration of BCG in your bladder.  Drinking too much before your treatment may make your bladder feel uncomfortably full.  If you normally take water tablets (diuretics) take them later in the day after your treatment.  Your nurse or doctor will give you more advise about preparing for your treatment.  You will have a small tube (catheter) placed into your bladder.  Your doctor will then put the liquid vaccine directly  into your bladder through the catheter and remove the catheter.  You will need to hold your urine for two hours afterwards.  This can be difficult but it's to give the treatment time to work.  You can walk around during this time.  When the treatment is over you can go to the toilet.  After your treatment there are some precautions you'll need to take.  This is because BCG is a live vaccine and other people shouldn't be exposed to it.  For the next six hours, you'll need to avoid your urine splashing on the toilet seat and getting any urine on your hands.  It might be easer for men to sit down when they're using an ordinary toilet although using a stand up urinal should be alright.  The main this is to avoid splashing urine and spreading the vaccine.  You will also be asked to put 1/2 cup undiluted bleach into the toilet to destroy any live vaccine and leave it for 15 minutes until you flush.  Side Effects Because BCG goes directly into the bladder most of the side effects are linked with the bladder.  They usually go away within one to two days after your treatment.  The most common ones are: -needing to pass urine often -pain when you pass urine -blood in urine -flu-like symptoms (tiredness, general aching and raised temperature)  Theses side effects should settle down within a day or two.  If they don't get better contact  your doctor.  Drinking lots of fluids can help flush the drug out of your bladder and reduce some of these effects.  Taking Ibuprofen or Aleve is encouraged unless you have a condition that would make these medications unsafe to take (renal failure, diabetes, gerd)  Rare side effects can include a continuing high temperature (fever), pain in your joints and a cough.  If you have any of these symptoms, or if you feel generally unwell, contact your doctor.  These symptoms could be a sign of a more serious infection (due to BCG) that needs to be treated immediately.  If this  happens you'll be treated with the same drugs (antibiotics) that are used to treat TB.  Contraception Men should use a condom during sex for the first 48 hours after their treatment.  If you are a women who has had BCG treatment then your partner should use a condom.  Using a condom will protect your partner from any vaccine present in your semen or vaginal fluid.  We don't know how BCG may affect a developing fetus so it's not advisable to become pregnant or father a child while having it.  It is important to use effective contraception during your treatment and for six weeks afterwards.  You can discuss this with your doctor or specialist nurse.

## 2020-09-25 LAB — MICROSCOPIC EXAMINATION: WBC, UA: >30 /hpf — AB (ref 0–5)

## 2020-09-25 LAB — URINALYSIS, COMPLETE
Bilirubin, UA: NEGATIVE
Glucose, UA: NEGATIVE
Ketones, UA: NEGATIVE
Nitrite, UA: NEGATIVE
Specific Gravity, UA: 1.025 (ref 1.005–1.030)
Urobilinogen, Ur: 0.2 mg/dL (ref 0.2–1.0)
pH, UA: 5 (ref 5.0–7.5)

## 2020-09-28 ENCOUNTER — Ambulatory Visit: Payer: Medicare PPO | Admitting: Physician Assistant

## 2020-10-01 ENCOUNTER — Other Ambulatory Visit: Payer: Self-pay

## 2020-10-01 ENCOUNTER — Encounter: Payer: Self-pay | Admitting: Physician Assistant

## 2020-10-01 ENCOUNTER — Ambulatory Visit: Payer: Medicare PPO | Admitting: Physician Assistant

## 2020-10-01 ENCOUNTER — Ambulatory Visit (INDEPENDENT_AMBULATORY_CARE_PROVIDER_SITE_OTHER): Payer: Medicare PPO | Admitting: Physician Assistant

## 2020-10-01 VITALS — BP 132/73 | HR 99 | Ht 70.0 in | Wt 193.0 lb

## 2020-10-01 DIAGNOSIS — C679 Malignant neoplasm of bladder, unspecified: Secondary | ICD-10-CM

## 2020-10-01 MED ORDER — BCG LIVE 50 MG IS SUSR
3.2400 mL | Freq: Once | INTRAVESICAL | Status: AC
  Administered 2020-10-01: 81 mg via INTRAVESICAL

## 2020-10-01 NOTE — Progress Notes (Signed)
BCG Bladder Instillation  BCG # 2 of 6  Due to Bladder Cancer patient is present today for a BCG treatment. Patient was cleaned and prepped in a sterile fashion with betadine. A 14FR catheter was inserted, urine return was noted 53ml, urine was yellow in color.  4ml of reconstituted BCG was instilled into the bladder. The catheter was then removed. Patient tolerated well, no complications were noted  Performed by: Debroah Loop, PA-C and Gordy Clement, [place]  Follow up/ Additional notes: 1 week for BCG #3

## 2020-10-01 NOTE — Progress Notes (Signed)
In and Out Catheterization  Patient is present today for a I & O catheterization due to patient's inability to void for UA. Patient was cleaned and prepped in a sterile fashion with betadine. A 14FR cath was inserted no complications were noted, 80ml of urine return was noted, urine was yellow in color. A clean urine sample was collected for urinalysis. Bladder was drained and catheter was removed with out difficulty.    Preformed by: Gordy Clement, [place]

## 2020-10-02 LAB — MICROSCOPIC EXAMINATION

## 2020-10-02 LAB — URINALYSIS, COMPLETE
Bilirubin, UA: NEGATIVE
Glucose, UA: NEGATIVE
Ketones, UA: NEGATIVE
Leukocytes,UA: NEGATIVE
Nitrite, UA: NEGATIVE
Protein,UA: NEGATIVE
Specific Gravity, UA: 1.025 (ref 1.005–1.030)
Urobilinogen, Ur: 0.2 mg/dL (ref 0.2–1.0)
pH, UA: 5 (ref 5.0–7.5)

## 2020-10-05 ENCOUNTER — Ambulatory Visit: Payer: Medicare PPO | Admitting: Physician Assistant

## 2020-10-08 ENCOUNTER — Ambulatory Visit (INDEPENDENT_AMBULATORY_CARE_PROVIDER_SITE_OTHER): Payer: Medicare PPO | Admitting: Physician Assistant

## 2020-10-08 ENCOUNTER — Other Ambulatory Visit: Payer: Self-pay

## 2020-10-08 ENCOUNTER — Ambulatory Visit: Payer: Medicare PPO | Admitting: Physician Assistant

## 2020-10-08 DIAGNOSIS — C679 Malignant neoplasm of bladder, unspecified: Secondary | ICD-10-CM

## 2020-10-08 MED ORDER — BCG LIVE 50 MG IS SUSR
3.2400 mL | Freq: Once | INTRAVESICAL | Status: AC
  Administered 2020-10-08: 81 mg via INTRAVESICAL

## 2020-10-08 NOTE — Progress Notes (Signed)
BCG Bladder Instillation ° °BCG # 3 of 6 ° °Due to Bladder Cancer patient is present today for a BCG treatment. Patient was cleaned and prepped in a sterile fashion with betadine. A 14FR catheter was inserted, urine return was noted 25ml, urine was yellow in color.  50ml of reconstituted BCG was instilled into the bladder. The catheter was then removed. Patient tolerated well, no complications were noted ° °Performed by: Jazion Atteberry, PA-C and Crysta Albright, CMA ° °Follow up/ Additional notes: 1 week for BCG #4 of 6 °  °

## 2020-10-09 LAB — URINALYSIS, COMPLETE
Bilirubin, UA: NEGATIVE
Glucose, UA: NEGATIVE
Ketones, UA: NEGATIVE
Leukocytes,UA: NEGATIVE
Nitrite, UA: NEGATIVE
RBC, UA: NEGATIVE
Specific Gravity, UA: 1.025 (ref 1.005–1.030)
Urobilinogen, Ur: 0.2 mg/dL (ref 0.2–1.0)
pH, UA: 5 (ref 5.0–7.5)

## 2020-10-09 LAB — MICROSCOPIC EXAMINATION

## 2020-10-10 ENCOUNTER — Encounter (INDEPENDENT_AMBULATORY_CARE_PROVIDER_SITE_OTHER): Payer: Self-pay

## 2020-10-15 ENCOUNTER — Ambulatory Visit: Payer: Medicare PPO | Admitting: Physician Assistant

## 2020-10-16 ENCOUNTER — Emergency Department: Payer: Medicare PPO

## 2020-10-16 ENCOUNTER — Other Ambulatory Visit: Payer: Self-pay

## 2020-10-16 ENCOUNTER — Emergency Department
Admission: EM | Admit: 2020-10-16 | Discharge: 2020-10-16 | Disposition: A | Discharge status: Home / Self Care | Payer: Medicare PPO | Attending: Emergency Medicine | Admitting: Emergency Medicine

## 2020-10-16 DIAGNOSIS — Z7982 Long term (current) use of aspirin: Secondary | ICD-10-CM | POA: Insufficient documentation

## 2020-10-16 DIAGNOSIS — Z79899 Other long term (current) drug therapy: Secondary | ICD-10-CM | POA: Diagnosis not present

## 2020-10-16 DIAGNOSIS — R079 Chest pain, unspecified: Secondary | ICD-10-CM | POA: Insufficient documentation

## 2020-10-16 DIAGNOSIS — R0789 Other chest pain: Secondary | ICD-10-CM | POA: Diagnosis not present

## 2020-10-16 DIAGNOSIS — M549 Dorsalgia, unspecified: Secondary | ICD-10-CM | POA: Diagnosis not present

## 2020-10-16 DIAGNOSIS — I2510 Atherosclerotic heart disease of native coronary artery without angina pectoris: Secondary | ICD-10-CM | POA: Diagnosis not present

## 2020-10-16 DIAGNOSIS — Z20822 Contact with and (suspected) exposure to covid-19: Secondary | ICD-10-CM | POA: Insufficient documentation

## 2020-10-16 DIAGNOSIS — N183 Chronic kidney disease, stage 3 unspecified: Secondary | ICD-10-CM | POA: Diagnosis not present

## 2020-10-16 DIAGNOSIS — I700 Atherosclerosis of aorta: Secondary | ICD-10-CM | POA: Diagnosis not present

## 2020-10-16 DIAGNOSIS — I70203 Unspecified atherosclerosis of native arteries of extremities, bilateral legs: Secondary | ICD-10-CM | POA: Diagnosis not present

## 2020-10-16 DIAGNOSIS — Z7902 Long term (current) use of antithrombotics/antiplatelets: Secondary | ICD-10-CM | POA: Insufficient documentation

## 2020-10-16 DIAGNOSIS — Z951 Presence of aortocoronary bypass graft: Secondary | ICD-10-CM | POA: Insufficient documentation

## 2020-10-16 DIAGNOSIS — Z8551 Personal history of malignant neoplasm of bladder: Secondary | ICD-10-CM | POA: Insufficient documentation

## 2020-10-16 DIAGNOSIS — M4856XA Collapsed vertebra, not elsewhere classified, lumbar region, initial encounter for fracture: Secondary | ICD-10-CM | POA: Diagnosis not present

## 2020-10-16 DIAGNOSIS — Z87891 Personal history of nicotine dependence: Secondary | ICD-10-CM | POA: Diagnosis not present

## 2020-10-16 DIAGNOSIS — K4090 Unilateral inguinal hernia, without obstruction or gangrene, not specified as recurrent: Secondary | ICD-10-CM | POA: Diagnosis not present

## 2020-10-16 DIAGNOSIS — N400 Enlarged prostate without lower urinary tract symptoms: Secondary | ICD-10-CM | POA: Diagnosis not present

## 2020-10-16 DIAGNOSIS — I129 Hypertensive chronic kidney disease with stage 1 through stage 4 chronic kidney disease, or unspecified chronic kidney disease: Secondary | ICD-10-CM | POA: Insufficient documentation

## 2020-10-16 DIAGNOSIS — J984 Other disorders of lung: Secondary | ICD-10-CM | POA: Diagnosis not present

## 2020-10-16 LAB — COMPREHENSIVE METABOLIC PANEL
ALT: 13 U/L (ref 0–44)
AST: 23 U/L (ref 15–41)
Albumin: 3.2 g/dL — ABNORMAL LOW (ref 3.5–5.0)
Alkaline Phosphatase: 60 U/L (ref 38–126)
Anion gap: 8 (ref 5–15)
BUN: 21 mg/dL (ref 8–23)
CO2: 20 mmol/L — ABNORMAL LOW (ref 22–32)
Calcium: 9.1 mg/dL (ref 8.9–10.3)
Chloride: 106 mmol/L (ref 98–111)
Creatinine, Ser: 1.4 mg/dL — ABNORMAL HIGH (ref 0.61–1.24)
GFR, Estimated: 50 mL/min — ABNORMAL LOW (ref >60)
Glucose, Bld: 174 mg/dL — ABNORMAL HIGH (ref 70–99)
Potassium: 4.3 mmol/L (ref 3.5–5.1)
Sodium: 134 mmol/L — ABNORMAL LOW (ref 135–145)
Total Bilirubin: 1.1 mg/dL (ref 0.3–1.2)
Total Protein: 7 g/dL (ref 6.5–8.1)

## 2020-10-16 LAB — CBC WITH DIFFERENTIAL/PLATELET
Abs Immature Granulocytes: 0.07 10*3/uL (ref 0.00–0.07)
Basophils Absolute: 0.1 10*3/uL (ref 0.0–0.1)
Basophils Relative: 0 %
Eosinophils Absolute: 0.1 10*3/uL (ref 0.0–0.5)
Eosinophils Relative: 0 %
HCT: 30.1 % — ABNORMAL LOW (ref 39.0–52.0)
Hemoglobin: 9.9 g/dL — ABNORMAL LOW (ref 13.0–17.0)
Immature Granulocytes: 1 %
Lymphocytes Relative: 9 %
Lymphs Abs: 1.3 10*3/uL (ref 0.7–4.0)
MCH: 39.8 pg — ABNORMAL HIGH (ref 26.0–34.0)
MCHC: 32.9 g/dL (ref 30.0–36.0)
MCV: 120.9 fL — ABNORMAL HIGH (ref 80.0–100.0)
Monocytes Absolute: 1.7 10*3/uL — ABNORMAL HIGH (ref 0.1–1.0)
Monocytes Relative: 13 %
Neutro Abs: 10.7 10*3/uL — ABNORMAL HIGH (ref 1.7–7.7)
Neutrophils Relative %: 77 %
Platelets: 114 10*3/uL — ABNORMAL LOW (ref 150–400)
RBC: 2.49 MIL/uL — ABNORMAL LOW (ref 4.22–5.81)
RDW: 17.8 % — ABNORMAL HIGH (ref 11.5–15.5)
Smear Review: NORMAL
WBC: 13.9 10*3/uL — ABNORMAL HIGH (ref 4.0–10.5)
nRBC: 0 % (ref 0.0–0.2)

## 2020-10-16 LAB — TROPONIN I (HIGH SENSITIVITY)
Troponin I (High Sensitivity): 9 ng/L (ref <18)
Troponin I (High Sensitivity): 11 ng/L (ref <18)

## 2020-10-16 LAB — LIPASE, BLOOD: Lipase: 17 U/L (ref 11–51)

## 2020-10-16 LAB — RESP PANEL BY RT-PCR (FLU A&B, COVID) ARPGX2
Influenza A by PCR: NEGATIVE
Influenza B by PCR: NEGATIVE
SARS Coronavirus 2 by RT PCR: NEGATIVE

## 2020-10-16 MED ORDER — DIPHENOXYLATE-ATROPINE 2.5-0.025 MG PO TABS: 1.0000 | ORAL_TABLET | Freq: Three times a day (TID) | ORAL | 0 refills | Status: DC | PRN

## 2020-10-16 MED ORDER — SODIUM CHLORIDE 0.9 % IV BOLUS
500.0000 mL | Freq: Once | INTRAVENOUS | Status: AC
  Administered 2020-10-16: 07:00:00 500 mL via INTRAVENOUS

## 2020-10-16 MED ORDER — IOHEXOL 350 MG/ML SOLN
100.0000 mL | Freq: Once | INTRAVENOUS | Status: AC | PRN
  Administered 2020-10-16: 09:00:00 100 mL via INTRAVENOUS

## 2020-10-16 MED ORDER — DIPHENOXYLATE-ATROPINE 2.5-0.025 MG PO TABS
1.0000 | ORAL_TABLET | Freq: Once | ORAL | Status: AC
  Administered 2020-10-16: 12:00:00 1 via ORAL
  Filled 2020-10-16: qty 1

## 2020-10-16 NOTE — ED Notes (Signed)
Pt refusing Covid Swab at this time. Wanting to talk to MD first

## 2020-10-16 NOTE — ED Triage Notes (Signed)
Pt in with co chest pain that started 3 hrs pta pt has hx of angina states feels the same. Denies any recent cold symptoms, is going through chemo for bladder cancer.

## 2020-10-16 NOTE — ED Provider Notes (Signed)
-----------------------------------------   11:29 AM on 10/16/2020 -----------------------------------------  Patient's work-up is overall reassuring including troponin negative x2.  No significant findings on CTA study.  I spoke to Dr. Lady Gary who is on-call for Dr. Gwen Pounds.  After reviewing the findings he agrees that the patient is lower risk and likely safe for discharge home.  He will speak to Dr. Gwen Pounds to get the patient into the office this week.  Patient is feeling better states he only has chest pain if he pushes on the center of his chest.  Very possibly experiencing chest wall/musculoskeletal discomfort.  With a reassuring work-up we will discharge the patient home with cardiology follow-up.  Patient agreeable to plan of care.  Discussed my typical chest pain return precautions.   Minna Antis, MD 10/16/20 1130

## 2020-10-16 NOTE — ED Provider Notes (Signed)
[facility] Emergency Department Provider Note  ____________________________________________   Event Date/Time   First MD Initiated Contact with Patient 10/16/20 [phone]     (approximate)  I have reviewed the triage vital signs and the nursing notes.   HISTORY  Chief Complaint Chest Pain    HPI Brandon Lewis is a 86 y.o. male  with medical history   CAD/ACS with a history of cardiac stents as well as a quadruple bypass and a history of a known ascending aortic aneurysm.  Dr. [name] is his cardiologist.  He presents tonight for evaluation of acute onset and moderate to severe sharp central and slightly left-sided chest pain.  He said it feels similar to anginal pain he has had in the past.  It feels worse when he takes a deep breath.  He denies fever, shortness of breath, cough, nausea, vomiting, abdominal pain.  Nothing in particular makes it better.  He does not typically have chest pain so this is concerning to him.  He states that it hurts "right where they told me I have an aneurysm".          Past Medical History:  Diagnosis Date   Bladder cancer [facility])    CAD (coronary artery disease)    s/p PCI after presenting with exertional angina (drug eluting stent)   Diverticulosis    GERD (gastroesophageal reflux disease)    HTN (hypertension)    Hyperlipidemia    Myocardial infarction ([place]) 09/2019   bypass and stent placed   Osteoarthritis    PVD (peripheral vascular disease) ([place])    s/p aortobifemoral bypass, with bilateral SFA occlusion and intermittent claudication    Small bowel obstruction [facility])     Patient Active Problem List   Diagnosis Date Noted   Thoracic aortic aneurysm ([place]) 05/17/2020   Aortic atherosclerosis ([place]) 05/17/2020   Hematuria 05/09/2020   BPH (benign prostatic hyperplasia) 11/16/2019   S/P CABG x 4    Atrial flutter ([place])    Coronary artery disease 09/16/2019   Allergic rhinitis 09/12/2019    Coronary artery disease due to lipid rich plaque    Essential hypertension    Sciatica, right side 09/22/2018   Chronic renal disease, stage III ([place]) 09/14/2018   Actinic keratosis 08/14/2015   Advanced directives, counseling/discussion 07/25/2014   IMPAIRED FASTING GLUCOSE 03/08/2010   CAD, NATIVE VESSEL 05/30/2009   Hyperlipemia 03/02/2009   Essential hypertension, benign 09/22/2007   Osteoarthritis, generalized 09/22/2007    Past Surgical History:  Procedure Laterality Date   aorto-bifem  2003   [place]    CAROTID STENT     [place]   CATARACT EXTRACTION, BILATERAL  2017   CHOLECYSTECTOMY  2002   CORONARY ARTERY BYPASS GRAFT N/A 09/16/2019   Procedure: CORONARY ARTERY BYPASS GRAFTING (CABG) using LIMA to LAD; Endoscopic right saphenous vein harvest to OM1, Diag1, and RCA.;  Surgeon: Wonda Olds, MD;  Location: [place];  Service: Open Heart Surgery;  Laterality: N/A;   LEFT HEART CATH AND CORONARY ANGIOGRAPHY N/A 09/12/2019   Procedure: LEFT HEART CATH AND CORONARY ANGIOGRAPHY;  Surgeon: Corey Skains, MD;  Location: [place] [facility];  Service: Cardiovascular;  Laterality: N/A;   TEE WITHOUT CARDIOVERSION N/A 09/16/2019   Procedure: TRANSESOPHAGEAL ECHOCARDIOGRAM (TEE);  Surgeon: Wonda Olds, MD;  Location: [place];  Service: Open Heart Surgery;  Laterality: N/A;   TONSILLECTOMY     TRANSURETHRAL RESECTION OF BLADDER TUMOR WITH MITOMYCIN-C N/A 07/24/2020   Procedure: TRANSURETHRAL RESECTION  OF BLADDER TUMOR WITH Gemcitabine;  Surgeon: Abbie Sons, MD;  Location: [facility];  Service: Urology;  Laterality: N/A;   WISDOM TOOTH EXTRACTION      Prior to Admission medications   Medication Sig Start Date End Date Taking? Authorizing Provider  aspirin 81 MG chewable tablet Chew 1 tablet (81 mg total) by mouth daily. 10/03/19   Antony Odea, PA-C  cholecalciferol (VITAMIN D) 1000 UNITS tablet Take 1,000 Units by mouth at bedtime.     [provider]  clopidogrel (PLAVIX) 75 MG tablet TAKE ONE TABLET BY MOUTH EVERY DAY *BOTTLE* Patient taking differently: Take 75 mg by mouth daily.  11/17/19   Venia Carbon, MD  Magnesium 400 MG CAPS Take 400 mg by mouth every morning.     [provider]  metoprolol succinate (TOPROL-XL) 50 MG 24 hr tablet **VIAL ONLY** TAKE (1) TABLET BY MOUTH ONCE A DAY.DO NOT CRUSH. (HIGHBLOOD PRESSURE) *BOTTLE* Patient taking differently: Take 50 mg by mouth every morning.  11/17/19   Venia Carbon, MD  Multiple Vitamin (MULTIVITAMIN WITH MINERALS) TABS tablet Take 1 tablet by mouth every morning.    [provider]  pantoprazole (PROTONIX) 40 MG tablet Take 40 mg by mouth daily. Patient not taking: Reported on 08/09/2020    [provider]  rosuvastatin (CRESTOR) 10 MG tablet **VIAL ONLY** TAKE ONE TABLET BY MOUTH AT BEDTIME. (IMPROVES CHOLESTEROL) Patient taking differently: Take 10 mg by mouth every morning.  11/17/19   Venia Carbon, MD  sodium chloride (BRONCHO SALINE) inhaler solution Take 1 spray by nebulization daily as needed (congestion).     [provider]  tamsulosin (FLOMAX) 0.4 MG CAPS capsule TAKE 1 CAPSULE BY MOUTH ONCE DAILY Patient taking differently: Take 0.4 mg by mouth daily after breakfast.  01/10/20   Venia Carbon, MD    Allergies Cilostazol and Simvastatin  Family History  Problem Relation Age of Onset   Diabetes Father        DM - and brother    Stroke Father    Coronary artery disease Paternal Grandfather    Colon cancer Neg Hx    Prostate cancer Neg Hx     Social History Social History   Tobacco Use   Smoking status: Former Smoker    Packs/day: 1.00    Years: 20.00    Pack years: 20.00    Types: Cigarettes    Quit date: 10/20/1992    Years since quitting: 28.0   Smokeless tobacco: Never Used   Tobacco comment: quit in 1994   Substance Use Topics   Alcohol use: Not Currently    Comment: rare   Drug  use: No    Review of Systems Constitutional: No fever/chills Eyes: No visual changes. ENT: No sore throat. Cardiovascular: +chest pain. Respiratory: Denies shortness of breath. Gastrointestinal: No abdominal pain.  No nausea, no vomiting.  Diarrhea for the last several days.  No constipation. Genitourinary: Negative for dysuria. Musculoskeletal: Negative for neck pain.  Negative for back pain. Integumentary: Negative for rash. Neurological: Negative for headaches, focal weakness or numbness.   ____________________________________________   PHYSICAL EXAM:  VITAL SIGNS: ED Triage Vitals  Enc Vitals Group     BP 10/16/20 0544 118/60     Pulse Rate 10/16/20 0544 (!) 124     Resp 10/16/20 0544 20     Temp 10/16/20 0544 99.1 F (37.3 C)     Temp Source 10/16/20 0544 Oral  SpO2 10/16/20 0544 100 %     Weight 10/16/20 0545 86.2 kg (190 lb)     Height 10/16/20 0545 1.778 m (5\' 10" )     Head Circumference --      Peak Flow --      Pain Score 10/16/20 0545 6     Pain Loc --      Pain Edu? --      Excl. in [place]? --     Constitutional: Alert and oriented.  Eyes: Conjunctivae are normal.  Head: Atraumatic. Nose: No congestion/rhinnorhea. Mouth/Throat: Patient is wearing a mask. Neck: No stridor.  No meningeal signs.   Cardiovascular: Normal rate, regular rhythm. Good peripheral circulation.  Reproducible tenderness to palpation of the left anterior chest wall. Respiratory: Normal respiratory effort.  No retractions. Gastrointestinal: Soft and nontender. No distention.  Musculoskeletal: No lower extremity tenderness nor edema. No gross deformities of extremities. Neurologic:  Normal speech and language. No gross focal neurologic deficits are appreciated.  Skin:  Skin is warm, dry and intact. Psychiatric: Mood and affect are normal. Speech and behavior are normal.  ____________________________________________   LABS (all labs ordered are listed, but only abnormal results  are displayed)  Pending at time of sign out ____________________________________________  EKG  ED ECG REPORT I, Hinda Kehr, the attending physician, personally viewed and interpreted this ECG.  Date: 10/16/2020 EKG Time: 5:33 AM Rate: 127 Rhythm: Sinus tachycardia QRS Axis: normal Intervals: normal ST/T Wave abnormalities: Nonspecific ST and T wave changes but without any clear evidence of ischemia Narrative Interpretation: no evidence of acute ischemia  ____________________________________________  RADIOLOGY I, Hinda Kehr, personally viewed and evaluated these images (plain radiographs) as part of my medical decision making, as well as reviewing the written report by the radiologist.  ED MD interpretation: Prominence of a sending aorta, known aneurysm, but no acute or emergent abnormality.  Prior CABG.  Official radiology report(s): DG Chest 2 View  Result Date: 10/16/2020 CLINICAL DATA:  Chest pain. EXAM: CHEST - 2 VIEW COMPARISON:  10/10/2019.  CT 09/14/2019. FINDINGS: Patient is rotated to the right. Prominence of the ascending aorta consistent with known ascending aortic aneurysm again noted. Reference is made to prior CT report 09/14/2019. Prior CABG. Heart size normal. No focal infiltrate. No pleural effusion or pneumothorax. Stable lower thoracic/upper lumbar compression fractures again noted. IMPRESSION: 1. No acute cardiopulmonary disease. 2. Prior CABG. Prominence of the ascending aorta consistent known ascending aortic aneurysm again noted. Electronically Signed   By: Marcello Moores  Register   On: 10/16/2020 06:20    ____________________________________________   PROCEDURES   Procedure(s) performed (including Critical Care):  .1-3 Lead EKG Interpretation Performed by: Hinda Kehr, MD Authorized by: Hinda Kehr, MD     Interpretation: abnormal     ECG rate:  120   ECG rate assessment: tachycardic     Rhythm: sinus tachycardia     Ectopy: none      Conduction: normal       ____________________________________________   INITIAL IMPRESSION / MDM / ASSESSMENT AND PLAN / ED COURSE  As part of my medical decision making, I reviewed the following data within the [place] notes reviewed and incorporated, Labs reviewed , EKG interpreted , Old chart reviewed, Patient signed out to Dr. Kerman Passey, Radiograph reviewed  and Notes from prior ED visits   Differential diagnosis includes, but is not limited to, ACS, AAS, PE, pneumonia, pneumothorax, musculoskeletal tenderness.  The patient is on the cardiac monitor to evaluate for evidence  of arrhythmia and/or significant heart rate changes.  Vital signs are stable except for a heart rate in the 120s although the patient feels no palpitations.  His chest pain is highly reproducible to palpation of the anterior chest wall on the left side, but he has extensive comorbidities and history of cardiac and aneurysmal disease.  His EKG shows no sign of ischemia.  Lab work is pending.  I personally reviewed the patient's imaging and agree with the radiologist's interpretation that there is no obvious acute abnormality although his known ascending aortic aneurysm is visible.  For his tachycardia I am giving a 500 mL normal saline IV bolus.  His pain is mild right now and we will hold off on analgesia.  No GI symptoms currently.  No tenderness to palpation of the abdomen.  Transferring ED care to Dr. Lenard Lance to follow-up on lab work and to reassess to determine the need for additional imaging such as a CTA chest to rule out dissection, and to determine if the patient needs chest pain observation in the hospital.         ____________________________________________  FINAL CLINICAL IMPRESSION(S) / ED DIAGNOSES  Final diagnoses:  Chest pain, unspecified type     MEDICATIONS GIVEN DURING THIS VISIT:  Medications  sodium chloride 0.9 % bolus 500 mL (500 mLs Intravenous  New Bag/Given 10/16/20 8119)     ED Discharge Orders    None      *Please note:  Brandon Lewis was evaluated in Emergency Department on 10/16/2020 for the symptoms described in the history of present illness. He was evaluated in the context of the global COVID-19 pandemic, which necessitated consideration that the patient might be at risk for infection with the SARS-CoV-2 virus that causes COVID-19. Institutional protocols and algorithms that pertain to the evaluation of patients at risk for COVID-19 are in a state of rapid change based on information released by regulatory bodies including the CDC and federal and state organizations. These policies and algorithms were followed during the patient's care in the ED.  Some ED evaluations and interventions may be delayed as a result of limited staffing during and after the pandemic.*  Note:  This document was prepared using Dragon voice recognition software and may include unintentional dictation errors.   Loleta Rose, MD 10/16/20 [phone]

## 2020-10-16 NOTE — Discharge Instructions (Addendum)
Please call the number provided for Dr. Gwen Pounds tomorrow to confirm your upcoming cardiology appointment.  Return to the emergency department for any return of/worsening chest pain, trouble breathing, or any other symptom personally concerning to yourself.

## 2020-10-17 ENCOUNTER — Telehealth: Payer: Self-pay | Admitting: Physician Assistant

## 2020-10-17 NOTE — Telephone Encounter (Signed)
This pt. Wants to talk to Dr. Lonna Cobb or Lelon Mast about issues he is having which include diarrhea and he did not want to discuss it with me.

## 2020-10-17 NOTE — Telephone Encounter (Signed)
I just spoke with the patient via telephone.  He reports a 6 to 7-day history of diarrhea that is primarily watery in nature and malodorous.  He has also had diffuse abdominal soreness and tenderness to palpation of the lower half of his abdomen.  He denies fever, chills, nausea, and vomiting.  He reports he has seen some bright red blood in his stools that he believes is secondary to hemorrhoids.  Patient was seen in the emergency department yesterday with reports of chest pain.  Due to his history of aortic aneurysm, a CT angio chest abdomen pelvis was performed which revealed surrounding inflammatory changes around his known descending and proximal sigmoid diverticulosis suggestive of diverticulitis.  Recent history notable for 21 days of Diflucan for treatment of yeast positive urine cultures while undergoing BCG installations for bladder cancer.  Diarrhea is a rare side effect of BCG installations.  Given his recent imaging findings, I do not believe BCG to be the likely etiology of his diarrhea.  At this point, I believe he is most likely to be experiencing mild diverticulitis that could be managed on an outpatient basis.  Additionally, given recent use of antimicrobials, C. difficile should also be considered.  I contacted Dr. Karle Starch after-hours line to request that he be seen in their clinic tomorrow for management of likely diverticulitis.  I counseled Brandon Lewis to proceed to the emergency department overnight if he develops fever, chills, nausea, vomiting, or severe abdominal pain.  He expressed understanding.

## 2020-10-17 NOTE — Telephone Encounter (Signed)
Patient's son called concerned about ongoing diarrhea. Has been taking OTC meds, advised to stay hydrated.

## 2020-10-17 NOTE — Telephone Encounter (Signed)
Pt called again. Would not discuss his symptoms from his treatment with me and only wants to speak to sam or dr Lonna Cobb

## 2020-10-18 ENCOUNTER — Telehealth: Payer: Self-pay

## 2020-10-18 ENCOUNTER — Encounter: Payer: Self-pay | Admitting: Internal Medicine

## 2020-10-18 ENCOUNTER — Ambulatory Visit: Payer: Medicare PPO | Admitting: Internal Medicine

## 2020-10-18 ENCOUNTER — Other Ambulatory Visit: Payer: Self-pay

## 2020-10-18 DIAGNOSIS — K5792 Diverticulitis of intestine, part unspecified, without perforation or abscess without bleeding: Secondary | ICD-10-CM

## 2020-10-18 DIAGNOSIS — K625 Hemorrhage of anus and rectum: Secondary | ICD-10-CM

## 2020-10-18 MED ORDER — HYDROCORTISONE 2.5 % EX CREA: TOPICAL_CREAM | Freq: Three times a day (TID) | CUTANEOUS | 3 refills | Status: DC | PRN

## 2020-10-18 MED ORDER — AMOXICILLIN-POT CLAVULANATE 875-125 MG PO TABS: 1.0000 | ORAL_TABLET | Freq: Two times a day (BID) | ORAL | 0 refills | Status: DC

## 2020-10-18 NOTE — Progress Notes (Signed)
Subjective:    Patient ID: Brandon Lewis, male    DOB: [birth year], 86 y.o.   MRN: [id number]  HPI Here with son due to diarrhea This visit occurred during the SARS-CoV-2 public health emergency.  Safety protocols were in place, including screening questions prior to the visit, additional usage of staff PPE, and extensive cleaning of exam room while observing appropriate contact time as indicated for disinfecting solutions.   Has been getting BCG for bladder tumor--instilled every Monday (but held this week) Diarrhea started 8-9 days ago Improves for a few hours than comes back again Having some pain in LLQ---though some better today Loose stool, mucous and blood seen (?from hemorrhoids) Awoke with chest pain 2 days ago---mimicked angina so to ER Records reviewed Had CT scan--found thyroid nodule, aortic atherosclerosis, diverticulosis with mild inflammation  Current Outpatient Medications on File Prior to Visit  Medication Sig Dispense Refill  . aspirin 81 MG chewable tablet Chew 1 tablet (81 mg total) by mouth daily.    . cholecalciferol (VITAMIN D) 1000 UNITS tablet Take 1,000 Units by mouth at bedtime.     . clopidogrel (PLAVIX) 75 MG tablet TAKE ONE TABLET BY MOUTH EVERY DAY *BOTTLE* (Patient taking differently: Take 75 mg by mouth daily.) 90 tablet 3  . diphenoxylate-atropine (LOMOTIL) 2.5-0.025 MG tablet Take 1 tablet by mouth 3 (three) times daily as needed for diarrhea or loose stools. 15 tablet 0  . Magnesium 400 MG CAPS Take 400 mg by mouth every morning.     . metoprolol succinate (TOPROL-XL) 50 MG 24 hr tablet **VIAL ONLY** TAKE (1) TABLET BY MOUTH ONCE A DAY.DO NOT CRUSH. (HIGHBLOOD PRESSURE) *BOTTLE* (Patient taking differently: Take 50 mg by mouth every morning.) 30 tablet 11  . Multiple Vitamin (MULTIVITAMIN WITH MINERALS) TABS tablet Take 1 tablet by mouth every morning.    . pantoprazole (PROTONIX) 40 MG tablet Take 40 mg by mouth daily.    . rosuvastatin (CRESTOR)  10 MG tablet **VIAL ONLY** TAKE ONE TABLET BY MOUTH AT BEDTIME. (IMPROVES CHOLESTEROL) (Patient taking differently: Take 10 mg by mouth every morning.) 90 tablet 3  . sodium chloride (BRONCHO SALINE) inhaler solution Take 1 spray by nebulization daily as needed (congestion).     . tamsulosin (FLOMAX) 0.4 MG CAPS capsule TAKE 1 CAPSULE BY MOUTH ONCE DAILY (Patient taking differently: Take 0.4 mg by mouth daily after breakfast.) 30 capsule 11   No current facility-administered medications on file prior to visit.    Allergies  Allergen Reactions  . Cilostazol Other (See Comments)    REACTION: stomach problems- constipation  . Simvastatin Other (See Comments)    REACTION: myalgia    Past Medical History:  Diagnosis Date  . Bladder cancer (HCC)   . CAD (coronary artery disease)    s/p PCI after presenting with exertional angina (drug eluting stent)  . Diverticulosis   . GERD (gastroesophageal reflux disease)   . HTN (hypertension)   . Hyperlipidemia   . Myocardial infarction (HCC) 09/2019   bypass and stent placed  . Osteoarthritis   . PVD (peripheral vascular disease) (HCC)    s/p aortobifemoral bypass, with bilateral SFA occlusion and intermittent claudication   . Small bowel obstruction [facility])     Past Surgical History:  Procedure Laterality Date  . aorto-bifem  2003   [facility]   . CAROTID STENT     [place]  . CATARACT EXTRACTION, BILATERAL  2017  . CHOLECYSTECTOMY  2002  . CORONARY ARTERY BYPASS GRAFT N/A  09/16/2019   Procedure: CORONARY ARTERY BYPASS GRAFTING (CABG) using LIMA to LAD; Endoscopic right saphenous vein harvest to OM1, Diag1, and RCA.;  Surgeon: Linden Dolin, MD;  Location: [facility];  Service: Open Heart Surgery;  Laterality: N/A;  . LEFT HEART CATH AND CORONARY ANGIOGRAPHY N/A 09/12/2019   Procedure: LEFT HEART CATH AND CORONARY ANGIOGRAPHY;  Surgeon: Lamar Blinks, MD;  Location: [facility] INVASIVE CV LAB;  Service: Cardiovascular;  Laterality: N/A;  . TEE  WITHOUT CARDIOVERSION N/A 09/16/2019   Procedure: TRANSESOPHAGEAL ECHOCARDIOGRAM (TEE);  Surgeon: Linden Dolin, MD;  Location: [facility] OR;  Service: Open Heart Surgery;  Laterality: N/A;  . TONSILLECTOMY    . TRANSURETHRAL RESECTION OF BLADDER TUMOR WITH MITOMYCIN-C N/A 07/24/2020   Procedure: TRANSURETHRAL RESECTION OF BLADDER TUMOR WITH Gemcitabine;  Surgeon: Riki Altes, MD;  Location: [facility] ORS;  Service: Urology;  Laterality: N/A;  . WISDOM TOOTH EXTRACTION      Family History  Problem Relation Age of Onset  . Diabetes Father        DM - and brother   . Stroke Father   . Coronary artery disease Paternal Grandfather   . Colon cancer Neg Hx   . Prostate cancer Neg Hx     Social History   Socioeconomic History  . Marital status: Married    Spouse name: Not on file  . Number of children: 3  . Years of education: Not on file  . Highest education level: Not on file  Occupational History  . Occupation: Merchant navy officer    Comment: Retired  Tobacco Use  . Smoking status: Former Smoker    Packs/day: 1.00    Years: 20.00    Pack years: 20.00    Types: Cigarettes    Quit date: 10/20/1992    Years since quitting: 28.0  . Smokeless tobacco: Never Used  . Tobacco comment: quit in 1994   Substance and Sexual Activity  . Alcohol use: Not Currently    Comment: rare  . Drug use: No  . Sexual activity: Not on file  Other Topics Concern  . Not on file  Social History Narrative   Widowed; then remarried; 3 sons.      Has a living will - wife has health care POA    Son Jena Gauss would be alternate   Would want resuscitation attempts   Would accept brief trial of artificial nutrition      Pt signed designated party release form and gives [place] [phone] (home #), access to medical records. Can also leave msg on home answering machine. Cell # [phone]   Social Determinants of Health   Financial Resource Strain: Not on file  Food Insecurity: Not on file   Transportation Needs: Not on file  Physical Activity: Not on file  Stress: Not on file  Social Connections: Not on file  Intimate Partner Violence: Not on file   Review of Systems No nausea or vomiting Appetite is okay---eating soft food No dysuria or urgency No fever No cough or breathing problems No recent antibiotic rx    Objective:   Physical Exam Constitutional:      Appearance: Normal appearance.  Cardiovascular:     Rate and Rhythm: Normal rate and regular rhythm.     Heart sounds: No murmur heard. No gallop.   Pulmonary:     Effort: Pulmonary effort is normal.     Breath sounds: No wheezing or rales.     Comments: Decreased breath sounds but  clear Abdominal:     Palpations: Abdomen is soft.     Comments: Decreased breath sounds Moderate LLQ tenderness and mild RLQ tenderness No rebound  Genitourinary:    Comments: Moderate sized circumference hemorrhoids--not thrombosed or inflamed Musculoskeletal:     Cervical back: Neck supple.     Right lower leg: No edema.     Left lower leg: No edema.  Lymphadenopathy:     Cervical: No cervical adenopathy.  Neurological:     Mental Status: He is alert.            Assessment & Plan:

## 2020-10-18 NOTE — Telephone Encounter (Signed)
[facility] Night - Client Nonclinical Telephone Record AccessNurse Client [facility] Night - Client Client Site  [facility] [place] - Night Physician Tillman Abide- MD Contact Type Call Who Is Calling Physician / Provider / Hospital Call Type Provider Call Message Only Reason for Call Request to send message to Office Initial Comment Carman Ching, PA with [facility] is wanting to see if Dr. Alphonsus Sias will work in a mutual pt tomorrow to be seen for possible diverticulitis. He has had diarrhea for 6 to 7 days and went to the ER yesterday for chest pains and diarrhea. The CT just so happened to show inflammation around the know diverticulosis showing signs of diverticulitis. She recently treated him with diflucan and also receiving BCG installations treating bladder cancer. Additional Comment The uro office number is [phone]. Disp. Time Disposition Final User 10/17/2020 5:54:30 PM General Information Provided Yes Reynaldo Minium Call Closed By: Reynaldo Minium Transaction Date/Time: 10/17/2020 5:47:06 PM (ET)

## 2020-10-18 NOTE — Telephone Encounter (Signed)
Diarrhea is not common presenting symptom with diverticulitis but CT result noted. I will see him and decide course of action today

## 2020-10-18 NOTE — Assessment & Plan Note (Signed)
Diarrhea not common with this but otherwise the presentation is classic and CT showed inflammation Will treat with augmentin---but stop after 3-5 days if better

## 2020-10-18 NOTE — Assessment & Plan Note (Signed)
Could be from the diverticulitis---but also has large hemorrhoids and he thinks that is the problem Will try 2.5% HC cream

## 2020-10-18 NOTE — Telephone Encounter (Signed)
I spoke with Carman Ching PA at [facility] urological and pt called about was Brandon Lewis DOB 04/02/1934. Samantha requested I get in touch with pt to schedule work in appt today. Carollee Herter CMA said could schedule 10/18/20 at 1:45. I spoke with pt; and he said last somewhat formed diarrhea stool was about 1 hr ago; mucus seen and pt has case of severe external hemorrhoids that is causing some bleeding. No abd pain now; pt appreciated appt and will be at [facility] to get checked in at 1:30. UC & ED precautions given and pt voiced understanding. No other covid symptoms except diarrhea. FYI to Dr Alphonsus Sias.

## 2020-10-18 NOTE — Patient Instructions (Signed)
Please start the augmentin today and if you are better in 3-5 days you can stop the antibiotic early

## 2020-10-22 ENCOUNTER — Other Ambulatory Visit: Payer: Self-pay

## 2020-10-22 ENCOUNTER — Ambulatory Visit (INDEPENDENT_AMBULATORY_CARE_PROVIDER_SITE_OTHER): Payer: Medicare PPO | Admitting: Physician Assistant

## 2020-10-22 ENCOUNTER — Encounter: Payer: Self-pay | Admitting: Physician Assistant

## 2020-10-22 ENCOUNTER — Encounter (INDEPENDENT_AMBULATORY_CARE_PROVIDER_SITE_OTHER): Payer: Medicare PPO | Admitting: Vascular Surgery

## 2020-10-22 VITALS — Ht 70.0 in | Wt 201.0 lb

## 2020-10-22 DIAGNOSIS — C679 Malignant neoplasm of bladder, unspecified: Secondary | ICD-10-CM | POA: Diagnosis not present

## 2020-10-22 MED ORDER — BCG LIVE 50 MG IS SUSR
3.2400 mL | Freq: Once | INTRAVESICAL | Status: AC
  Administered 2020-10-22: 81 mg via INTRAVESICAL

## 2020-10-22 NOTE — Progress Notes (Signed)
BCG Bladder Instillation  BCG # 4 of 6  Due to Bladder Cancer patient is present today for a BCG treatment. Patient was cleaned and prepped in a sterile fashion with betadine. A 14FR catheter was inserted, urine return was noted 84ml, urine was yellow in color.  9ml of reconstituted BCG was instilled into the bladder. The catheter was then removed. Patient tolerated well, no complications were noted  Performed by: Carman Ching, PA-C   Follow up/ Additional notes: 1 week for BCG #5 of 6

## 2020-10-23 LAB — URINALYSIS, COMPLETE
Bilirubin, UA: NEGATIVE
Glucose, UA: NEGATIVE
Ketones, UA: NEGATIVE
Leukocytes,UA: NEGATIVE
Nitrite, UA: NEGATIVE
Specific Gravity, UA: 1.02 (ref 1.005–1.030)
Urobilinogen, Ur: 0.2 mg/dL (ref 0.2–1.0)
pH, UA: 6 (ref 5.0–7.5)

## 2020-10-23 LAB — MICROSCOPIC EXAMINATION: RBC, Urine: NONE SEEN /hpf (ref 0–2)

## 2020-10-29 ENCOUNTER — Other Ambulatory Visit: Payer: Self-pay

## 2020-10-29 ENCOUNTER — Ambulatory Visit (INDEPENDENT_AMBULATORY_CARE_PROVIDER_SITE_OTHER): Payer: Medicare PPO | Admitting: Physician Assistant

## 2020-10-29 DIAGNOSIS — C679 Malignant neoplasm of bladder, unspecified: Secondary | ICD-10-CM

## 2020-10-29 MED ORDER — BCG LIVE 50 MG IS SUSR
3.2400 mL | Freq: Once | INTRAVESICAL | Status: AC
  Administered 2020-10-29: 81 mg via INTRAVESICAL

## 2020-10-29 NOTE — Progress Notes (Signed)
BCG Bladder Instillation  BCG # 5 of 6  Due to Bladder Cancer patient is present today for a BCG treatment. Patient was cleaned and prepped in a sterile fashion with betadine. A 14FR catheter was inserted, urine return was noted 22ml, urine was yellow in color.  67ml of reconstituted BCG was instilled into the bladder. The catheter was then removed. Patient tolerated well, no complications were noted  Performed by: Debroah Loop, PA-C and Bradly Bienenstock, CMA  Follow up/ Additional notes: 1 week for BCG #6 of 6

## 2020-10-30 LAB — URINALYSIS, COMPLETE
Bilirubin, UA: NEGATIVE
Glucose, UA: NEGATIVE
Ketones, UA: NEGATIVE
Leukocytes,UA: NEGATIVE
Nitrite, UA: NEGATIVE
RBC, UA: NEGATIVE
Specific Gravity, UA: 1.02 (ref 1.005–1.030)
Urobilinogen, Ur: 0.2 mg/dL (ref 0.2–1.0)
pH, UA: 6 (ref 5.0–7.5)

## 2020-10-30 LAB — MICROSCOPIC EXAMINATION: Bacteria, UA: NONE SEEN

## 2020-11-05 ENCOUNTER — Ambulatory Visit: Payer: Medicare PPO | Admitting: Physician Assistant

## 2020-11-07 DIAGNOSIS — H6123 Impacted cerumen, bilateral: Secondary | ICD-10-CM | POA: Diagnosis not present

## 2020-11-07 DIAGNOSIS — J340 Abscess, furuncle and carbuncle of nose: Secondary | ICD-10-CM | POA: Diagnosis not present

## 2020-11-09 ENCOUNTER — Ambulatory Visit (INDEPENDENT_AMBULATORY_CARE_PROVIDER_SITE_OTHER): Payer: Medicare PPO | Admitting: Physician Assistant

## 2020-11-09 ENCOUNTER — Other Ambulatory Visit: Payer: Self-pay

## 2020-11-09 DIAGNOSIS — C679 Malignant neoplasm of bladder, unspecified: Secondary | ICD-10-CM | POA: Diagnosis not present

## 2020-11-09 LAB — URINALYSIS, COMPLETE
Bilirubin, UA: NEGATIVE
Glucose, UA: NEGATIVE
Ketones, UA: NEGATIVE
Leukocytes,UA: NEGATIVE
Nitrite, UA: NEGATIVE
RBC, UA: NEGATIVE
Specific Gravity, UA: >1.03 — ABNORMAL HIGH (ref 1.005–1.030)
Urobilinogen, Ur: 0.2 mg/dL (ref 0.2–1.0)
pH, UA: 5.5 (ref 5.0–7.5)

## 2020-11-09 LAB — MICROSCOPIC EXAMINATION
Bacteria, UA: NONE SEEN
RBC, Urine: NONE SEEN /hpf (ref 0–2)

## 2020-11-09 MED ORDER — BCG LIVE 50 MG IS SUSR
3.2400 mL | Freq: Once | INTRAVESICAL | Status: AC
  Administered 2020-11-09: 81 mg via INTRAVESICAL

## 2020-11-09 NOTE — Progress Notes (Signed)
BCG Bladder Instillation  BCG # 6 of 6  Due to Bladder Cancer patient is present today for a BCG treatment. Patient was cleaned and prepped in a sterile fashion with betadine. A 14FR catheter was inserted, urine return was noted 30ml, urine was yellow in color.  67ml of reconstituted BCG was instilled into the bladder. The catheter was then removed. Patient tolerated well, no complications were noted  Performed by: Debroah Loop, PA-C and Bradly Bienenstock, CMA  Follow up/ Additional notes: Return in about 3 months (around 02/07/2021) for Cystoscopy with Dr. Bernardo Heater.

## 2020-11-30 ENCOUNTER — Encounter: Payer: Self-pay | Admitting: Podiatry

## 2020-11-30 ENCOUNTER — Other Ambulatory Visit: Payer: Self-pay

## 2020-11-30 ENCOUNTER — Ambulatory Visit: Payer: Medicare PPO | Admitting: Podiatry

## 2020-11-30 DIAGNOSIS — M79676 Pain in unspecified toe(s): Secondary | ICD-10-CM

## 2020-11-30 DIAGNOSIS — L989 Disorder of the skin and subcutaneous tissue, unspecified: Secondary | ICD-10-CM | POA: Diagnosis not present

## 2020-11-30 DIAGNOSIS — B351 Tinea unguium: Secondary | ICD-10-CM | POA: Diagnosis not present

## 2020-12-03 ENCOUNTER — Other Ambulatory Visit: Payer: Self-pay

## 2020-12-03 ENCOUNTER — Ambulatory Visit (INDEPENDENT_AMBULATORY_CARE_PROVIDER_SITE_OTHER): Payer: Medicare PPO | Admitting: Vascular Surgery

## 2020-12-03 ENCOUNTER — Encounter (INDEPENDENT_AMBULATORY_CARE_PROVIDER_SITE_OTHER): Payer: Self-pay | Admitting: Vascular Surgery

## 2020-12-03 VITALS — BP 136/78 | HR 88 | Resp 16 | Ht 70.0 in | Wt 197.6 lb

## 2020-12-03 DIAGNOSIS — I2510 Atherosclerotic heart disease of native coronary artery without angina pectoris: Secondary | ICD-10-CM | POA: Diagnosis not present

## 2020-12-03 DIAGNOSIS — E785 Hyperlipidemia, unspecified: Secondary | ICD-10-CM

## 2020-12-03 DIAGNOSIS — I739 Peripheral vascular disease, unspecified: Secondary | ICD-10-CM | POA: Diagnosis not present

## 2020-12-03 DIAGNOSIS — N1832 Chronic kidney disease, stage 3b: Secondary | ICD-10-CM

## 2020-12-03 DIAGNOSIS — I10 Essential (primary) hypertension: Secondary | ICD-10-CM

## 2020-12-03 NOTE — Progress Notes (Signed)
MRN : [id number]  Brandon Lewis is a 87 y.o. (12-12-1933) male who presents with chief complaint of  Chief Complaint  Patient presents with  . New Patient (Initial Visit)    Ref [name] claudication  .  History of Present Illness:    The patient is seen for evaluation of painful lower extremities and diminished pulses. Patient notes the pain is always associated with activity and is very consistent day today. Typically, the pain occurs at less than one block, progress is as activity continues to the point that the patient must stop walking. Resting including standing still for several minutes allowed resumption of the activity and the ability to walk a similar distance before stopping again. Uneven terrain and inclined shorten the distance. The pain has been progressive over the past several years.   The patient denies rest pain or dangling of an extremity off the side of the bed during the night for relief. No open wounds or sores at this time. No prior interventions or surgeries.  No history of back problems or DJD of the lumbar sacral spine.   The patient denies changes in claudication symptoms or new rest pain symptoms.  No new ulcers or wounds of the foot.  The patient's blood pressure has been stable and relatively well controlled. The patient denies amaurosis fugax or recent TIA symptoms. There are no recent neurological changes noted. The patient denies history of DVT, PE or superficial thrombophlebitis. The patient denies recent episodes of angina or shortness of breath.   Current Meds  Medication Sig  . aspirin 81 MG chewable tablet Chew 1 tablet (81 mg total) by mouth daily.  . cholecalciferol (VITAMIN D) 1000 UNITS tablet Take 1,000 Units by mouth at bedtime.   . clopidogrel (PLAVIX) 75 MG tablet TAKE ONE TABLET BY MOUTH EVERY DAY *BOTTLE* (Patient taking differently: Take 75 mg by mouth daily.)  . hydrocortisone 2.5 % cream Apply topically 3 (three) times daily  as needed.  . Magnesium 400 MG CAPS Take 400 mg by mouth every morning.   . metoprolol succinate (TOPROL-XL) 50 MG 24 hr tablet **VIAL ONLY** TAKE (1) TABLET BY MOUTH ONCE A DAY.DO NOT CRUSH. (HIGHBLOOD PRESSURE) *BOTTLE* (Patient taking differently: Take 50 mg by mouth every morning.)  . Multiple Vitamin (MULTIVITAMIN WITH MINERALS) TABS tablet Take 1 tablet by mouth every morning.  . pantoprazole (PROTONIX) 40 MG tablet Take 40 mg by mouth daily.  . rosuvastatin (CRESTOR) 10 MG tablet **VIAL ONLY** TAKE ONE TABLET BY MOUTH AT BEDTIME. (IMPROVES CHOLESTEROL) (Patient taking differently: Take 10 mg by mouth every morning.)  . sodium chloride (BRONCHO SALINE) inhaler solution Take 1 spray by nebulization daily as needed (congestion).   . tamsulosin (FLOMAX) 0.4 MG CAPS capsule TAKE 1 CAPSULE BY MOUTH ONCE DAILY (Patient taking differently: Take 0.4 mg by mouth daily after breakfast.)    Past Medical History:  Diagnosis Date  . Bladder cancer ([place])   . CAD (coronary artery disease)    s/p PCI after presenting with exertional angina (drug eluting stent)  . Diverticulosis   . GERD (gastroesophageal reflux disease)   . HTN (hypertension)   . Hyperlipidemia   . Myocardial infarction ([place]) 09/2019   bypass and stent placed  . Osteoarthritis   . PVD (peripheral vascular disease) ([place])    s/p aortobifemoral bypass, with bilateral SFA occlusion and intermittent claudication   . Small bowel obstruction [facility])     Past Surgical History:  Procedure Laterality Date  .  aorto-bifem  2003   [facility]   . CAROTID STENT     [place]  . CATARACT EXTRACTION, BILATERAL  2017  . CHOLECYSTECTOMY  2002  . CORONARY ARTERY BYPASS GRAFT N/A 09/16/2019   Procedure: CORONARY ARTERY BYPASS GRAFTING (CABG) using LIMA to LAD; Endoscopic right saphenous vein harvest to OM1, Diag1, and RCA.;  Surgeon: Wonda Olds, MD;  Location: [place];  Service: Open Heart Surgery;  Laterality: N/A;  . LEFT HEART CATH AND CORONARY  ANGIOGRAPHY N/A 09/12/2019   Procedure: LEFT HEART CATH AND CORONARY ANGIOGRAPHY;  Surgeon: Corey Skains, MD;  Location: [place] CV LAB;  Service: Cardiovascular;  Laterality: N/A;  . TEE WITHOUT CARDIOVERSION N/A 09/16/2019   Procedure: TRANSESOPHAGEAL ECHOCARDIOGRAM (TEE);  Surgeon: Wonda Olds, MD;  Location: [place];  Service: Open Heart Surgery;  Laterality: N/A;  . TONSILLECTOMY    . TRANSURETHRAL RESECTION OF BLADDER TUMOR WITH MITOMYCIN-C N/A 07/24/2020   Procedure: TRANSURETHRAL RESECTION OF BLADDER TUMOR WITH Gemcitabine;  Surgeon: Abbie Sons, MD;  Location: [facility] ORS;  Service: Urology;  Laterality: N/A;  . WISDOM TOOTH EXTRACTION      Social History Social History   Tobacco Use  . Smoking status: Former Smoker    Packs/day: 1.00    Years: 20.00    Pack years: 20.00    Types: Cigarettes    Quit date: 10/20/1992    Years since quitting: 28.1  . Smokeless tobacco: Never Used  . Tobacco comment: quit in 1994   Substance Use Topics  . Alcohol use: Not Currently    Comment: rare  . Drug use: No    Family History Family History  Problem Relation Age of Onset  . Diabetes Father        DM - and brother   . Stroke Father   . Coronary artery disease Paternal Grandfather   . Colon cancer Neg Hx   . Prostate cancer Neg Hx   No family history of bleeding/clotting disorders, porphyria or autoimmune disease   Allergies  Allergen Reactions  . Cilostazol Other (See Comments)    REACTION: stomach problems- constipation  . Simvastatin Other (See Comments)    REACTION: myalgia     REVIEW OF SYSTEMS (Negative unless checked)  Constitutional: [] Weight loss  [] Fever  [] Chills Cardiac: [] Chest pain   [] Chest pressure   [] Palpitations   [] Shortness of breath when laying flat   [] Shortness of breath with exertion. Vascular:  [x] Pain in legs with walking   [] Pain in legs at rest  [] History of DVT   [] Phlebitis   [] Swelling in legs   [] Varicose veins    [] Non-healing ulcers Pulmonary:   [] Uses home oxygen   [] Productive cough   [] Hemoptysis   [] Wheeze  [] COPD   [] Asthma Neurologic:  [] Dizziness   [] Seizures   [] History of stroke   [] History of TIA  [] Aphasia   [] Vissual changes   [] Weakness or numbness in arm   [] Weakness or numbness in leg Musculoskeletal:   [] Joint swelling   [x] Joint pain   [x] Low back pain Hematologic:  [] Easy bruising  [] Easy bleeding   [] Hypercoagulable state   [] Anemic Gastrointestinal:  [] Diarrhea   [] Vomiting  [x] Gastroesophageal reflux/heartburn   [] Difficulty swallowing. Genitourinary:  [x] Chronic kidney disease   [] Difficult urination  [] Frequent urination   [] Blood in urine Skin:  [] Rashes   [] Ulcers  Psychological:  [] History of anxiety   []  History of major depression.  Physical Examination  Vitals:   12/03/20 1323  BP: 136/78  Pulse: 88  Resp: 16  Weight: 197 lb 9.6 oz (89.6 kg)  Height: 5\' 10"  (1.778 m)   Body mass index is 28.35 kg/m. Gen: WD/WN, NAD Head: [place]/AT, No temporalis wasting.  Ear/Nose/Throat: Hearing grossly intact, nares w/o erythema or drainage, poor dentition Eyes: PER, EOMI, sclera nonicteric.  Neck: Supple, no masses.  No bruit or JVD.  Pulmonary:  Good air movement, clear to auscultation bilaterally, no use of accessory muscles.  Cardiac: RRR, normal S1, S2, no Murmurs. Vascular:  Vessel Right Left  Radial Palpable Palpable  PT Not Palpable Not Palpable  DP Not Palpable Not Palpable  Gastrointestinal: soft, non-distended. No guarding/no peritoneal signs.  Musculoskeletal: M/S 5/5 throughout.  No deformity or atrophy.  Neurologic: CN 2-12 intact. Pain and light touch intact in extremities.  Symmetrical.  Speech is fluent. Motor exam as listed above. Psychiatric: Judgment intact, Mood & affect appropriate for pt's clinical situation. Dermatologic: No rashes or ulcers noted.  No changes consistent with cellulitis.  CBC Lab Results  Component Value Date   WBC 13.9 (H)  10/16/2020   HGB 9.9 (L) 10/16/2020   HCT 30.1 (L) 10/16/2020   MCV 120.9 (H) 10/16/2020   PLT 114 (L) 10/16/2020    BMET    Component Value Date/Time   NA 134 (L) 10/16/2020 0634   K 4.3 10/16/2020 0634   CL 106 10/16/2020 0634   CO2 20 (L) 10/16/2020 0634   GLUCOSE 174 (H) 10/16/2020 0634   BUN 21 10/16/2020 0634   CREATININE 1.40 (H) 10/16/2020 0634   CALCIUM 9.1 10/16/2020 0634   GFRNONAA 50 (L) 10/16/2020 0634   GFRAA 45 (L) 10/01/2019 0327   CrCl cannot be calculated (Patient's most recent lab result is older than the maximum 21 days allowed.).  COAG Lab Results  Component Value Date   INR 1.3 (H) 09/16/2019   INR 1.1 09/14/2019   INR 1.0 09/11/2019    Radiology No results found.    Assessment/Plan 1. PAD (peripheral artery disease) (HCC)  Recommend:  The patient has evidence of atherosclerosis of the lower extremities with claudication.  The patient does not voice lifestyle limiting changes at this point in time.  Noninvasive studies do not suggest clinically significant change.  No invasive studies, angiography or surgery at this time The patient should continue walking and begin a more formal exercise program.  The patient should continue antiplatelet therapy and aggressive treatment of the lipid abnormalities  No changes in the patient's medications at this time  The patient should continue wearing graduated compression socks 10-15 mmHg strength to control the mild edema.   - VAS [place] ABI WITH/WO TBI; Future  2. Essential hypertension Continue antihypertensive medications as already ordered, these medications have been reviewed and there are no changes at this time.   3. Coronary artery disease involving native coronary artery of native heart without angina pectoris Continue cardiac and antihypertensive medications as already ordered and reviewed, no changes at this time.  Continue statin as ordered and reviewed, no changes at this time  Nitrates  PRN for chest pain   4. Hyperlipidemia, unspecified hyperlipidemia type Continue statin as ordered and reviewed, no changes at this time   5. Stage 3b chronic kidney disease ([place]) At the present time the patient has moderate renal insufficiency.  Continue follow up with nephrology as ordered without interruption.  Avoid nephrotoxic medications and dehydration.  Further plans per nephrology    Hortencia Pilar, MD  12/03/2020 1:34 PM

## 2020-12-07 DIAGNOSIS — I25119 Atherosclerotic heart disease of native coronary artery with unspecified angina pectoris: Secondary | ICD-10-CM | POA: Diagnosis not present

## 2020-12-07 DIAGNOSIS — I10 Essential (primary) hypertension: Secondary | ICD-10-CM | POA: Diagnosis not present

## 2020-12-07 DIAGNOSIS — E785 Hyperlipidemia, unspecified: Secondary | ICD-10-CM | POA: Diagnosis not present

## 2020-12-07 DIAGNOSIS — I739 Peripheral vascular disease, unspecified: Secondary | ICD-10-CM | POA: Diagnosis not present

## 2020-12-07 DIAGNOSIS — I4892 Unspecified atrial flutter: Secondary | ICD-10-CM | POA: Diagnosis not present

## 2020-12-07 DIAGNOSIS — Z951 Presence of aortocoronary bypass graft: Secondary | ICD-10-CM | POA: Diagnosis not present

## 2020-12-07 DIAGNOSIS — R0989 Other specified symptoms and signs involving the circulatory and respiratory systems: Secondary | ICD-10-CM | POA: Diagnosis not present

## 2020-12-07 DIAGNOSIS — I712 Thoracic aortic aneurysm, without rupture: Secondary | ICD-10-CM | POA: Diagnosis not present

## 2020-12-09 ENCOUNTER — Encounter (INDEPENDENT_AMBULATORY_CARE_PROVIDER_SITE_OTHER): Payer: Self-pay | Admitting: Vascular Surgery

## 2020-12-11 NOTE — Progress Notes (Signed)
    Subjective: Patient is a 87 y.o. male presenting to the office today for follow up evaluation of painful callus lesion(s) noted to the bilateral feet. Walking and bearing weight increases the pain. He has not had any recent treatment for the symptoms.  Patient also complains of elongated, thickened nails that cause pain while ambulating in shoes. He is unable to trim his own nails. Patient presents today for further treatment and evaluation.  Past Medical History:  Diagnosis Date  . Bladder cancer ([place])   . CAD (coronary artery disease)    s/p PCI after presenting with exertional angina (drug eluting stent)  . Diverticulosis   . GERD (gastroesophageal reflux disease)   . HTN (hypertension)   . Hyperlipidemia   . Myocardial infarction ([place]) 09/2019   bypass and stent placed  . Osteoarthritis   . PVD (peripheral vascular disease) ([place])    s/p aortobifemoral bypass, with bilateral SFA occlusion and intermittent claudication   . Small bowel obstruction ([facility])     Objective:  Physical Exam General: Alert and oriented x3 in no acute distress  Dermatology: Hyperkeratotic lesion(s) present on the bilateral feet. Pain on palpation with a central nucleated core noted. Skin is warm, dry and supple bilateral lower extremities. Negative for open lesions or macerations. Nails are tender, long, thickened and dystrophic with subungual debris, consistent with onychomycosis, 1-5 bilateral. No signs of infection noted.  Vascular: Palpable pedal pulses bilaterally. No edema or erythema noted. Capillary refill within normal limits.  Neurological: Epicritic and protective threshold grossly intact bilaterally.   Musculoskeletal Exam: Pain on palpation at the keratotic lesion(s) noted. Range of motion within normal limits bilateral. Muscle strength 5/5 in all groups bilateral.  Assessment: 1. Onychodystrophic nails 1-5 bilateral with hyperkeratosis of nails.  2. Onychomycosis of nail due to  dermatophyte bilateral 3. Pre-ulcerative callus lesions noted to the bilateral feet x 4   Plan of Care:  1. Patient evaluated. 2. Excisional debridement of keratoic lesion(s) using a chisel blade was performed without incident.  3. Dressed with light dressing. 4. Mechanical debridement of nails 1-5 bilaterally performed using a nail nipper. Filed with dremel without incident.  5. Patient is to return to the clinic in 3 months.   Edrick Kins, DPM [facility]  Dr. Edrick Kins, [address]                                        [place], [place] [address]                Office [phone]  Fax [phone]

## 2020-12-21 ENCOUNTER — Encounter (INDEPENDENT_AMBULATORY_CARE_PROVIDER_SITE_OTHER): Payer: Self-pay | Admitting: Nurse Practitioner

## 2020-12-21 ENCOUNTER — Other Ambulatory Visit: Payer: Self-pay

## 2020-12-21 ENCOUNTER — Ambulatory Visit (INDEPENDENT_AMBULATORY_CARE_PROVIDER_SITE_OTHER): Payer: Medicare PPO | Admitting: Nurse Practitioner

## 2020-12-21 ENCOUNTER — Ambulatory Visit (INDEPENDENT_AMBULATORY_CARE_PROVIDER_SITE_OTHER): Payer: Medicare PPO

## 2020-12-21 VITALS — BP 142/64 | HR 85 | Resp 16 | Wt 202.0 lb

## 2020-12-21 DIAGNOSIS — I10 Essential (primary) hypertension: Secondary | ICD-10-CM

## 2020-12-21 DIAGNOSIS — I739 Peripheral vascular disease, unspecified: Secondary | ICD-10-CM | POA: Diagnosis not present

## 2020-12-21 DIAGNOSIS — E785 Hyperlipidemia, unspecified: Secondary | ICD-10-CM

## 2020-12-26 ENCOUNTER — Telehealth (INDEPENDENT_AMBULATORY_CARE_PROVIDER_SITE_OTHER): Payer: Self-pay

## 2020-12-26 NOTE — Telephone Encounter (Signed)
Spoke with the patient and he is scheduled with Dr. Delana Meyer for a left leg angio (01/15/21- 11:00 am arrival ) and right leg angio (01/22/21- 11:00 am arrival time) to the [facility]. Covid testing on 01/11/21 between 8-2 pm and 01/18/21 between 8-2 pm at the [facility]. Pre-procedure instructions were discussed and will be mailed.

## 2020-12-31 ENCOUNTER — Encounter (INDEPENDENT_AMBULATORY_CARE_PROVIDER_SITE_OTHER): Payer: Self-pay | Admitting: Nurse Practitioner

## 2020-12-31 NOTE — H&P (View-Only) (Signed)
Subjective:    Patient ID: Brandon Lewis, male    DOB: 20-May-1934, 87 y.o.   MRN: [id number] Chief Complaint  Patient presents with  . Follow-up    Ultrasound follow up    Brandon Lewis is an 87 year old male that presents to the office for  review of the noninvasive studies.  The patient was initially seen due to pain with ambulation.  Patient describes been able to walk for a certain length before he needs to sit and rest due to significant pain.  This has been ongoing for some time.  He also describes some mild rest pain like symptoms.  There have been no significant changes to the patient's overall health care.  The patient denies amaurosis fugax or recent TIA symptoms. There are no recent neurological changes noted. The patient denies history of DVT, PE or superficial thrombophlebitis. The patient denies recent episodes of angina or shortness of breath.   ABI's Rt= 0.66 and Lt= 0.59 (no previous ABIs for comparison) Duplex US of the lower extremity arterial system shows monophasic tibial artery waveforms bilaterally with dampened toe waveforms bilaterally.   Review of Systems  Cardiovascular:       Claudication  Musculoskeletal: Positive for arthralgias.  All other systems reviewed and are negative.      Objective:   Physical Exam Vitals reviewed.  HENT:     Head: Normocephalic.  Cardiovascular:     Rate and Rhythm: Normal rate.     Pulses: Decreased pulses.  Pulmonary:     Effort: Pulmonary effort is normal.  Skin:    General: Skin is warm and dry.  Neurological:     Mental Status: He is alert and oriented to person, place, and time.  Psychiatric:        Mood and Affect: Mood normal.        Behavior: Behavior normal.        Thought Content: Thought content normal.        Judgment: Judgment normal.     BP (!) 142/64 (BP Location: Right Arm)   Pulse 85   Resp 16   Wt 202 lb (91.6 kg)   BMI 28.98 kg/m   Past Medical History:  Diagnosis Date  .  Bladder cancer ([place])   . CAD (coronary artery disease)    s/p PCI after presenting with exertional angina (drug eluting stent)  . Diverticulosis   . GERD (gastroesophageal reflux disease)   . HTN (hypertension)   . Hyperlipidemia   . Myocardial infarction ([place]) 09/2019   bypass and stent placed  . Osteoarthritis   . PVD (peripheral vascular disease) ([place])    s/p aortobifemoral bypass, with bilateral SFA occlusion and intermittent claudication   . Small bowel obstruction ([facility])     Social History   Socioeconomic History  . Marital status: Married    Spouse name: Not on file  . Number of children: 3  . Years of education: Not on file  . Highest education level: Not on file  Occupational History  . Occupation: Landscape architect    Comment: Retired  Tobacco Use  . Smoking status: Former Smoker    Packs/day: 1.00    Years: 20.00    Pack years: 20.00    Types: Cigarettes    Quit date: 10/20/1992    Years since quitting: 28.2  . Smokeless tobacco: Never Used  . Tobacco comment: quit in 1994   Substance and Sexual Activity  . Alcohol use: Not Currently  Comment: rare  . Drug use: No  . Sexual activity: Not on file  Other Topics Concern  . Not on file  Social History Narrative   Widowed; then remarried; 3 sons.      Has a living will - wife has health care POA    Son [name] would be alternate   Would want resuscitation attempts   Would accept brief trial of artificial nutrition      Pt signed designated party release form and gives Kamen Hanken [phone] (home #), access to medical records. Can also leave msg on home answering machine. Cell # [phone]   Social Determinants of Health   Financial Resource Strain: Not on file  Food Insecurity: Not on file  Transportation Needs: Not on file  Physical Activity: Not on file  Stress: Not on file  Social Connections: Not on file  Intimate Partner Violence: Not on file    Past Surgical History:  Procedure  Laterality Date  . aorto-bifem  2003   [facility]   . CAROTID STENT     [facility]  . CATARACT EXTRACTION, BILATERAL  2017  . CHOLECYSTECTOMY  2002  . CORONARY ARTERY BYPASS GRAFT N/A 09/16/2019   Procedure: CORONARY ARTERY BYPASS GRAFTING (CABG) using LIMA to LAD; Endoscopic right saphenous vein harvest to OM1, Diag1, and RCA.;  Surgeon: Wonda Olds, MD;  Location: [facility];  Service: Open Heart Surgery;  Laterality: N/A;  . LEFT HEART CATH AND CORONARY ANGIOGRAPHY N/A 09/12/2019   Procedure: LEFT HEART CATH AND CORONARY ANGIOGRAPHY;  Surgeon: Corey Skains, MD;  Location: [facility];  Service: Cardiovascular;  Laterality: N/A;  . TEE WITHOUT CARDIOVERSION N/A 09/16/2019   Procedure: TRANSESOPHAGEAL ECHOCARDIOGRAM (TEE);  Surgeon: Wonda Olds, MD;  Location: [facility];  Service: Open Heart Surgery;  Laterality: N/A;  . TONSILLECTOMY    . TRANSURETHRAL RESECTION OF BLADDER TUMOR WITH MITOMYCIN-C N/A 07/24/2020   Procedure: TRANSURETHRAL RESECTION OF BLADDER TUMOR WITH Gemcitabine;  Surgeon: Abbie Sons, MD;  Location: [facility];  Service: Urology;  Laterality: N/A;  . WISDOM TOOTH EXTRACTION      Family History  Problem Relation Age of Onset  . Diabetes Father        DM - and brother   . Stroke Father   . Coronary artery disease Paternal Grandfather   . Colon cancer Neg Hx   . Prostate cancer Neg Hx     Allergies  Allergen Reactions  . Cilostazol Other (See Comments)    REACTION: stomach problems- constipation  . Simvastatin Other (See Comments)    REACTION: myalgia    CBC Latest Ref Rng & Units 10/16/2020 07/26/2020 07/20/2020  WBC 4.0 - 10.5 K/uL 13.9(H) 6.8 7.1  Hemoglobin 13.0 - 17.0 g/dL 9.9(L) 10.1(L) 9.8(L)  Hematocrit 39.0 - 52.0 % 30.1(L) 30.4(L) 31.8(L)  Platelets 150 - 400 K/uL 114(L) 131(L) 136(L)      CMP     Component Value Date/Time   NA 134 (L) 10/16/2020 0634   K 4.3 10/16/2020 0634   CL 106 10/16/2020 0634   CO2 20 (L) 10/16/2020 0634    GLUCOSE 174 (H) 10/16/2020 0634   BUN 21 10/16/2020 0634   CREATININE 1.40 (H) 10/16/2020 0634   CALCIUM 9.1 10/16/2020 0634   PROT 7.0 10/16/2020 0634   ALBUMIN 3.2 (L) 10/16/2020 0634   AST 23 10/16/2020 0634   ALT 13 10/16/2020 0634   ALKPHOS 60 10/16/2020 0634   BILITOT 1.1 10/16/2020 3532  GFRNONAA 50 (L) 10/16/2020 0634   GFRAA 45 (L) 10/01/2019 0327     VAS [place] ABI WITH/WO TBI  Result Date: 12/24/2020 LOWER EXTREMITY DOPPLER STUDY Indications: Peripheral artery disease.  Performing Technologist: Almira Coaster RVS  Examination Guidelines: A complete evaluation includes at minimum, Doppler waveform signals and systolic blood pressure reading at the level of bilateral brachial, anterior tibial, and posterior tibial arteries, when vessel segments are accessible. Bilateral testing is considered an integral part of a complete examination. Photoelectric Plethysmograph (PPG) waveforms and toe systolic pressure readings are included as required and additional duplex testing as needed. Limited examinations for reoccurring indications may be performed as noted.  ABI Findings: +---------+------------------+-----+----------+--------+ Right    Rt Pressure (mmHg)IndexWaveform  Comment  +---------+------------------+-----+----------+--------+ Brachial 135                                       +---------+------------------+-----+----------+--------+ ATA      72                0.50 monophasic         +---------+------------------+-----+----------+--------+ PTA      96                0.66 monophasic         +---------+------------------+-----+----------+--------+ Great Toe65                0.45 Abnormal           +---------+------------------+-----+----------+--------+ +---------+------------------+-----+----------+-------+ Left     Lt Pressure (mmHg)IndexWaveform  Comment +---------+------------------+-----+----------+-------+ Brachial 145                                       +---------+------------------+-----+----------+-------+ ATA      85                0.59 monophasic        +---------+------------------+-----+----------+-------+ PTA      85                0.59 monophasic        +---------+------------------+-----+----------+-------+ Great Toe49                0.34 Abnormal          +---------+------------------+-----+----------+-------+ +-------+-----------+-----------+------------+------------+ ABI/TBIToday's ABIToday's TBIPrevious ABIPrevious TBI +-------+-----------+-----------+------------+------------+ Right  .66        .45                                 +-------+-----------+-----------+------------+------------+ Left   .59        .34                                 +-------+-----------+-----------+------------+------------+  Summary: Right: Resting right ankle-brachial index indicates moderate right lower extremity arterial disease. The right toe-brachial index is abnormal. Left: Resting left ankle-brachial index indicates moderate left lower extremity arterial disease. The left toe-brachial index is abnormal.  *See table(s) above for measurements and observations.  Electronically signed by Hortencia Pilar MD on 12/24/2020 at 5:42:22 PM.   Final        Assessment & Plan:   1. PAD (peripheral artery disease) ([place]) Recommend:  The patient has experienced increased symptoms and is now describing lifestyle limiting  claudication and mild rest pain.   Given the severity of the patient's lower extremity symptoms the patient should undergo angiography and intervention.  The patient should undergo intervention on his left lower extremity followed by his right.  Risk and benefits were reviewed the patient.  Indications for the procedure were reviewed.  All questions were answered, the patient agrees to proceed.   The patient should continue walking and begin a more formal exercise program.  The patient should continue  antiplatelet therapy and aggressive treatment of the lipid abnormalities  The patient will follow up with me after the angiogram.   2. Hyperlipidemia, unspecified hyperlipidemia type Continue statin as ordered and reviewed, no changes at this time   3. Essential hypertension Continue antihypertensive medications as already ordered, these medications have been reviewed and there are no changes at this time.    Current Outpatient Medications on File Prior to Visit  Medication Sig Dispense Refill  . aspirin 81 MG chewable tablet Chew 1 tablet (81 mg total) by mouth daily.    . cholecalciferol (VITAMIN D) 1000 UNITS tablet Take 1,000 Units by mouth at bedtime.     . clopidogrel (PLAVIX) 75 MG tablet TAKE ONE TABLET BY MOUTH EVERY DAY *BOTTLE* (Patient taking differently: Take 75 mg by mouth daily.) 90 tablet 3  . hydrocortisone 2.5 % cream Apply topically 3 (three) times daily as needed. 28 g 3  . Magnesium 400 MG CAPS Take 400 mg by mouth every morning.     . metoprolol succinate (TOPROL-XL) 50 MG 24 hr tablet **VIAL ONLY** TAKE (1) TABLET BY MOUTH ONCE A DAY.DO NOT CRUSH. (HIGHBLOOD PRESSURE) *BOTTLE* (Patient taking differently: Take 50 mg by mouth every morning.) 30 tablet 11  . Multiple Vitamin (MULTIVITAMIN WITH MINERALS) TABS tablet Take 1 tablet by mouth every morning.    . pantoprazole (PROTONIX) 40 MG tablet Take 40 mg by mouth daily.    . rosuvastatin (CRESTOR) 10 MG tablet **VIAL ONLY** TAKE ONE TABLET BY MOUTH AT BEDTIME. (IMPROVES CHOLESTEROL) (Patient taking differently: Take 10 mg by mouth every morning.) 90 tablet 3  . tamsulosin (FLOMAX) 0.4 MG CAPS capsule TAKE 1 CAPSULE BY MOUTH ONCE DAILY (Patient taking differently: Take 0.4 mg by mouth daily after breakfast.) 30 capsule 11  . diphenoxylate-atropine (LOMOTIL) 2.5-0.025 MG tablet Take 1 tablet by mouth 3 (three) times daily as needed for diarrhea or loose stools. 15 tablet 0  . sodium chloride (BRONCHO SALINE) inhaler  solution Take 1 spray by nebulization daily as needed (congestion).      No current facility-administered medications on file prior to visit.    There are no Patient Instructions on file for this visit. No follow-ups on file.   Kris Hartmann, NP

## 2020-12-31 NOTE — Progress Notes (Signed)
Subjective:    Patient ID: Brandon Lewis, male    DOB: 1934-01-01, 87 y.o.   MRN: [id number] Chief Complaint  Patient presents with  . Follow-up    Ultrasound follow up    Brandon Lewis is an 87 year old male that presents to the office for  review of the noninvasive studies.  The patient was initially seen due to pain with ambulation.  Patient describes been able to walk for a certain length before he needs to sit and rest due to significant pain.  This has been ongoing for some time.  He also describes some mild rest pain like symptoms.  There have been no significant changes to the patient's overall health care.  The patient denies amaurosis fugax or recent TIA symptoms. There are no recent neurological changes noted. The patient denies history of DVT, PE or superficial thrombophlebitis. The patient denies recent episodes of angina or shortness of breath.   ABI's Rt= 0.66 and Lt= 0.59 (no previous ABIs for comparison) Duplex US of the lower extremity arterial system shows monophasic tibial artery waveforms bilaterally with dampened toe waveforms bilaterally.   Review of Systems  Cardiovascular:       Claudication  Musculoskeletal: Positive for arthralgias.  All other systems reviewed and are negative.      Objective:   Physical Exam Vitals reviewed.  HENT:     Head: Normocephalic.  Cardiovascular:     Rate and Rhythm: Normal rate.     Pulses: Decreased pulses.  Pulmonary:     Effort: Pulmonary effort is normal.  Skin:    General: Skin is warm and dry.  Neurological:     Mental Status: He is alert and oriented to person, place, and time.  Psychiatric:        Mood and Affect: Mood normal.        Behavior: Behavior normal.        Thought Content: Thought content normal.        Judgment: Judgment normal.     BP (!) 142/64 (BP Location: Right Arm)   Pulse 85   Resp 16   Wt 202 lb (91.6 kg)   BMI 28.98 kg/m   Past Medical History:  Diagnosis Date  .  Bladder cancer ([place])   . CAD (coronary artery disease)    s/p PCI after presenting with exertional angina (drug eluting stent)  . Diverticulosis   . GERD (gastroesophageal reflux disease)   . HTN (hypertension)   . Hyperlipidemia   . Myocardial infarction ([place]) 09/2019   bypass and stent placed  . Osteoarthritis   . PVD (peripheral vascular disease) ([place])    s/p aortobifemoral bypass, with bilateral SFA occlusion and intermittent claudication   . Small bowel obstruction ([facility])     Social History   Socioeconomic History  . Marital status: Married    Spouse name: Not on file  . Number of children: 3  . Years of education: Not on file  . Highest education level: Not on file  Occupational History  . Occupation: Landscape architect    Comment: Retired  Tobacco Use  . Smoking status: Former Smoker    Packs/day: 1.00    Years: 20.00    Pack years: 20.00    Types: Cigarettes    Quit date: 10/20/1992    Years since quitting: 28.2  . Smokeless tobacco: Never Used  . Tobacco comment: quit in 1994   Substance and Sexual Activity  . Alcohol use: Not Currently  Comment: rare  . Drug use: No  . Sexual activity: Not on file  Other Topics Concern  . Not on file  Social History Narrative   Widowed; then remarried; 3 sons.      Has a living will - wife has health care POA    Son Brandon Lewis would be alternate   Would want resuscitation attempts   Would accept brief trial of artificial nutrition      Pt signed designated party release form and gives Brandon Lewis [phone] (home #), access to medical records. Can also leave msg on home answering machine. Cell # [phone]   Social Determinants of Health   Financial Resource Strain: Not on file  Food Insecurity: Not on file  Transportation Needs: Not on file  Physical Activity: Not on file  Stress: Not on file  Social Connections: Not on file  Intimate Partner Violence: Not on file    Past Surgical History:  Procedure  Laterality Date  . aorto-bifem  2003   Brandon   . CAROTID STENT     [place]  . CATARACT EXTRACTION, BILATERAL  2017  . CHOLECYSTECTOMY  2002  . CORONARY ARTERY BYPASS GRAFT N/A 09/16/2019   Procedure: CORONARY ARTERY BYPASS GRAFTING (CABG) using LIMA to LAD; Endoscopic right saphenous vein harvest to OM1, Diag1, and RCA.;  Surgeon: Wonda Olds, MD;  Location: [facility];  Service: Open Heart Surgery;  Laterality: N/A;  . LEFT HEART CATH AND CORONARY ANGIOGRAPHY N/A 09/12/2019   Procedure: LEFT HEART CATH AND CORONARY ANGIOGRAPHY;  Surgeon: Corey Skains, MD;  Location: [facility];  Service: Cardiovascular;  Laterality: N/A;  . TEE WITHOUT CARDIOVERSION N/A 09/16/2019   Procedure: TRANSESOPHAGEAL ECHOCARDIOGRAM (TEE);  Surgeon: Wonda Olds, MD;  Location: [facility];  Service: Open Heart Surgery;  Laterality: N/A;  . TONSILLECTOMY    . TRANSURETHRAL RESECTION OF BLADDER TUMOR WITH MITOMYCIN-C N/A 07/24/2020   Procedure: TRANSURETHRAL RESECTION OF BLADDER TUMOR WITH Gemcitabine;  Surgeon: Abbie Sons, MD;  Location: [facility];  Service: Urology;  Laterality: N/A;  . WISDOM TOOTH EXTRACTION      Family History  Problem Relation Age of Onset  . Diabetes Father        DM - and brother   . Stroke Father   . Coronary artery disease Paternal Grandfather   . Colon cancer Neg Hx   . Prostate cancer Neg Hx     Allergies  Allergen Reactions  . Cilostazol Other (See Comments)    REACTION: stomach problems- constipation  . Simvastatin Other (See Comments)    REACTION: myalgia    CBC Latest Ref Rng & Units 10/16/2020 07/26/2020 07/20/2020  WBC 4.0 - 10.5 K/uL 13.9(H) 6.8 7.1  Hemoglobin 13.0 - 17.0 g/dL 9.9(L) 10.1(L) 9.8(L)  Hematocrit 39.0 - 52.0 % 30.1(L) 30.4(L) 31.8(L)  Platelets 150 - 400 K/uL 114(L) 131(L) 136(L)      CMP     Component Value Date/Time   NA 134 (L) 10/16/2020 0634   K 4.3 10/16/2020 0634   CL 106 10/16/2020 0634   CO2 20 (L) 10/16/2020 0634    GLUCOSE 174 (H) 10/16/2020 0634   BUN 21 10/16/2020 0634   CREATININE 1.40 (H) 10/16/2020 0634   CALCIUM 9.1 10/16/2020 0634   PROT 7.0 10/16/2020 0634   ALBUMIN 3.2 (L) 10/16/2020 0634   AST 23 10/16/2020 0634   ALT 13 10/16/2020 0634   ALKPHOS 60 10/16/2020 0634   BILITOT 1.1 10/16/2020 9629  GFRNONAA 50 (L) 10/16/2020 0634   GFRAA 45 (L) 10/01/2019 0327     VAS [place] ABI WITH/WO TBI  Result Date: 12/24/2020 LOWER EXTREMITY DOPPLER STUDY Indications: Peripheral artery disease.  Performing Technologist: Almira Coaster RVS  Examination Guidelines: A complete evaluation includes at minimum, Doppler waveform signals and systolic blood pressure reading at the level of bilateral brachial, anterior tibial, and posterior tibial arteries, when vessel segments are accessible. Bilateral testing is considered an integral part of a complete examination. Photoelectric Plethysmograph (PPG) waveforms and toe systolic pressure readings are included as required and additional duplex testing as needed. Limited examinations for reoccurring indications may be performed as noted.  ABI Findings: +---------+------------------+-----+----------+--------+ Right    Rt Pressure (mmHg)IndexWaveform  Comment  +---------+------------------+-----+----------+--------+ Brachial 135                                       +---------+------------------+-----+----------+--------+ ATA      72                0.50 monophasic         +---------+------------------+-----+----------+--------+ PTA      96                0.66 monophasic         +---------+------------------+-----+----------+--------+ Great Toe65                0.45 Abnormal           +---------+------------------+-----+----------+--------+ +---------+------------------+-----+----------+-------+ Left     Lt Pressure (mmHg)IndexWaveform  Comment +---------+------------------+-----+----------+-------+ Brachial 145                                       +---------+------------------+-----+----------+-------+ ATA      85                0.59 monophasic        +---------+------------------+-----+----------+-------+ PTA      85                0.59 monophasic        +---------+------------------+-----+----------+-------+ Great Toe49                0.34 Abnormal          +---------+------------------+-----+----------+-------+ +-------+-----------+-----------+------------+------------+ ABI/TBIToday's ABIToday's TBIPrevious ABIPrevious TBI +-------+-----------+-----------+------------+------------+ Right  .66        .45                                 +-------+-----------+-----------+------------+------------+ Left   .59        .34                                 +-------+-----------+-----------+------------+------------+  Summary: Right: Resting right ankle-brachial index indicates moderate right lower extremity arterial disease. The right toe-brachial index is abnormal. Left: Resting left ankle-brachial index indicates moderate left lower extremity arterial disease. The left toe-brachial index is abnormal.  *See table(s) above for measurements and observations.  Electronically signed by Hortencia Pilar MD on 12/24/2020 at 5:42:22 PM.   Final        Assessment & Plan:   1. PAD (peripheral artery disease) ([place]) Recommend:  The patient has experienced increased symptoms and is now describing lifestyle limiting  claudication and mild rest pain.   Given the severity of the patient's lower extremity symptoms the patient should undergo angiography and intervention.  The patient should undergo intervention on his left lower extremity followed by his right.  Risk and benefits were reviewed the patient.  Indications for the procedure were reviewed.  All questions were answered, the patient agrees to proceed.   The patient should continue walking and begin a more formal exercise program.  The patient should continue  antiplatelet therapy and aggressive treatment of the lipid abnormalities  The patient will follow up with me after the angiogram.   2. Hyperlipidemia, unspecified hyperlipidemia type Continue statin as ordered and reviewed, no changes at this time   3. Essential hypertension Continue antihypertensive medications as already ordered, these medications have been reviewed and there are no changes at this time.    Current Outpatient Medications on File Prior to Visit  Medication Sig Dispense Refill  . aspirin 81 MG chewable tablet Chew 1 tablet (81 mg total) by mouth daily.    . cholecalciferol (VITAMIN D) 1000 UNITS tablet Take 1,000 Units by mouth at bedtime.     . clopidogrel (PLAVIX) 75 MG tablet TAKE ONE TABLET BY MOUTH EVERY DAY *BOTTLE* (Patient taking differently: Take 75 mg by mouth daily.) 90 tablet 3  . hydrocortisone 2.5 % cream Apply topically 3 (three) times daily as needed. 28 g 3  . Magnesium 400 MG CAPS Take 400 mg by mouth every morning.     . metoprolol succinate (TOPROL-XL) 50 MG 24 hr tablet **VIAL ONLY** TAKE (1) TABLET BY MOUTH ONCE A DAY.DO NOT CRUSH. (HIGHBLOOD PRESSURE) *BOTTLE* (Patient taking differently: Take 50 mg by mouth every morning.) 30 tablet 11  . Multiple Vitamin (MULTIVITAMIN WITH MINERALS) TABS tablet Take 1 tablet by mouth every morning.    . pantoprazole (PROTONIX) 40 MG tablet Take 40 mg by mouth daily.    . rosuvastatin (CRESTOR) 10 MG tablet **VIAL ONLY** TAKE ONE TABLET BY MOUTH AT BEDTIME. (IMPROVES CHOLESTEROL) (Patient taking differently: Take 10 mg by mouth every morning.) 90 tablet 3  . tamsulosin (FLOMAX) 0.4 MG CAPS capsule TAKE 1 CAPSULE BY MOUTH ONCE DAILY (Patient taking differently: Take 0.4 mg by mouth daily after breakfast.) 30 capsule 11  . diphenoxylate-atropine (LOMOTIL) 2.5-0.025 MG tablet Take 1 tablet by mouth 3 (three) times daily as needed for diarrhea or loose stools. 15 tablet 0  . sodium chloride (BRONCHO SALINE) inhaler  solution Take 1 spray by nebulization daily as needed (congestion).      No current facility-administered medications on file prior to visit.    There are no Patient Instructions on file for this visit. No follow-ups on file.   Kris Hartmann, NP

## 2021-01-11 ENCOUNTER — Other Ambulatory Visit
Admission: RE | Admit: 2021-01-11 | Discharge: 2021-01-11 | Disposition: A | Discharge status: Home / Self Care | Payer: Medicare PPO | Source: Ambulatory Visit | Attending: Vascular Surgery | Admitting: Vascular Surgery

## 2021-01-11 ENCOUNTER — Other Ambulatory Visit: Payer: Self-pay

## 2021-01-11 DIAGNOSIS — Z20822 Contact with and (suspected) exposure to covid-19: Secondary | ICD-10-CM | POA: Insufficient documentation

## 2021-01-11 DIAGNOSIS — Z01812 Encounter for preprocedural laboratory examination: Secondary | ICD-10-CM | POA: Diagnosis not present

## 2021-01-11 LAB — SARS CORONAVIRUS 2 (TAT 6-24 HRS): SARS Coronavirus 2: NEGATIVE

## 2021-01-14 ENCOUNTER — Other Ambulatory Visit (INDEPENDENT_AMBULATORY_CARE_PROVIDER_SITE_OTHER): Payer: Self-pay | Admitting: Nurse Practitioner

## 2021-01-15 ENCOUNTER — Encounter: Payer: Self-pay | Admitting: Vascular Surgery

## 2021-01-15 ENCOUNTER — Encounter
Admission: RE | Disposition: A | Discharge status: Home / Self Care | Payer: Self-pay | Source: Ambulatory Visit | Attending: Vascular Surgery

## 2021-01-15 ENCOUNTER — Other Ambulatory Visit: Payer: Self-pay

## 2021-01-15 ENCOUNTER — Ambulatory Visit
Admission: RE | Admit: 2021-01-15 | Discharge: 2021-01-15 | Disposition: A | Discharge status: Home / Self Care | Payer: Medicare PPO | Source: Ambulatory Visit | Attending: Vascular Surgery | Admitting: Vascular Surgery

## 2021-01-15 DIAGNOSIS — Z955 Presence of coronary angioplasty implant and graft: Secondary | ICD-10-CM | POA: Diagnosis not present

## 2021-01-15 DIAGNOSIS — Z95828 Presence of other vascular implants and grafts: Secondary | ICD-10-CM | POA: Insufficient documentation

## 2021-01-15 DIAGNOSIS — Z7982 Long term (current) use of aspirin: Secondary | ICD-10-CM | POA: Insufficient documentation

## 2021-01-15 DIAGNOSIS — Z7902 Long term (current) use of antithrombotics/antiplatelets: Secondary | ICD-10-CM | POA: Diagnosis not present

## 2021-01-15 DIAGNOSIS — I70223 Atherosclerosis of native arteries of extremities with rest pain, bilateral legs: Secondary | ICD-10-CM | POA: Insufficient documentation

## 2021-01-15 DIAGNOSIS — Z9049 Acquired absence of other specified parts of digestive tract: Secondary | ICD-10-CM | POA: Insufficient documentation

## 2021-01-15 DIAGNOSIS — Z8249 Family history of ischemic heart disease and other diseases of the circulatory system: Secondary | ICD-10-CM | POA: Diagnosis not present

## 2021-01-15 DIAGNOSIS — I10 Essential (primary) hypertension: Secondary | ICD-10-CM | POA: Diagnosis not present

## 2021-01-15 DIAGNOSIS — E785 Hyperlipidemia, unspecified: Secondary | ICD-10-CM | POA: Insufficient documentation

## 2021-01-15 DIAGNOSIS — I252 Old myocardial infarction: Secondary | ICD-10-CM | POA: Diagnosis not present

## 2021-01-15 DIAGNOSIS — Z888 Allergy status to other drugs, medicaments and biological substances status: Secondary | ICD-10-CM | POA: Diagnosis not present

## 2021-01-15 DIAGNOSIS — Z8551 Personal history of malignant neoplasm of bladder: Secondary | ICD-10-CM | POA: Diagnosis not present

## 2021-01-15 DIAGNOSIS — Z79899 Other long term (current) drug therapy: Secondary | ICD-10-CM | POA: Insufficient documentation

## 2021-01-15 DIAGNOSIS — Z951 Presence of aortocoronary bypass graft: Secondary | ICD-10-CM | POA: Diagnosis not present

## 2021-01-15 DIAGNOSIS — Z87891 Personal history of nicotine dependence: Secondary | ICD-10-CM | POA: Diagnosis not present

## 2021-01-15 DIAGNOSIS — I70219 Atherosclerosis of native arteries of extremities with intermittent claudication, unspecified extremity: Secondary | ICD-10-CM

## 2021-01-15 HISTORY — PX: LOWER EXTREMITY ANGIOGRAPHY: CATH118251

## 2021-01-15 LAB — BUN: BUN: 23 mg/dL (ref 8–23)

## 2021-01-15 LAB — CREATININE, SERUM
Creatinine, Ser: 1.58 mg/dL — ABNORMAL HIGH (ref 0.61–1.24)
GFR, Estimated: 43 mL/min — ABNORMAL LOW (ref >60)

## 2021-01-15 SURGERY — LOWER EXTREMITY ANGIOGRAPHY
Anesthesia: Moderate Sedation | Site: Leg Lower | Laterality: Left

## 2021-01-15 MED ORDER — DIPHENHYDRAMINE HCL 50 MG/ML IJ SOLN: 50.0000 mg | Freq: Once | INTRAMUSCULAR | Status: DC | PRN

## 2021-01-15 MED ORDER — FENTANYL CITRATE (PF) 100 MCG/2ML IJ SOLN
INTRAMUSCULAR | Status: AC
  Filled 2021-01-15: qty 2

## 2021-01-15 MED ORDER — CEFAZOLIN SODIUM-DEXTROSE 2-4 GM/100ML-% IV SOLN
2.0000 g | Freq: Once | INTRAVENOUS | Status: AC
  Administered 2021-01-15: 2 g via INTRAVENOUS

## 2021-01-15 MED ORDER — IODIXANOL 320 MG/ML IV SOLN
INTRAVENOUS | Status: DC | PRN
  Administered 2021-01-15: 50 mL

## 2021-01-15 MED ORDER — HYDROMORPHONE HCL 1 MG/ML IJ SOLN: 1.0000 mg | Freq: Once | INTRAMUSCULAR | Status: DC | PRN

## 2021-01-15 MED ORDER — SODIUM CHLORIDE 0.9 % IV SOLN: INTRAVENOUS | Status: DC

## 2021-01-15 MED ORDER — ONDANSETRON HCL 4 MG/2ML IJ SOLN: 4.0000 mg | Freq: Four times a day (QID) | INTRAMUSCULAR | Status: DC | PRN

## 2021-01-15 MED ORDER — HEPARIN SODIUM (PORCINE) 1000 UNIT/ML IJ SOLN
INTRAMUSCULAR | Status: AC
  Filled 2021-01-15: qty 1

## 2021-01-15 MED ORDER — MIDAZOLAM HCL 5 MG/5ML IJ SOLN
INTRAMUSCULAR | Status: AC
  Filled 2021-01-15: qty 5

## 2021-01-15 MED ORDER — FAMOTIDINE 20 MG PO TABS: 40.0000 mg | ORAL_TABLET | Freq: Once | ORAL | Status: DC | PRN

## 2021-01-15 MED ORDER — ACETAMINOPHEN 325 MG PO TABS: 650.0000 mg | ORAL_TABLET | ORAL | Status: DC | PRN

## 2021-01-15 MED ORDER — MIDAZOLAM HCL 2 MG/ML PO SYRP: 8.0000 mg | ORAL_SOLUTION | Freq: Once | ORAL | Status: DC | PRN

## 2021-01-15 MED ORDER — LABETALOL HCL 5 MG/ML IV SOLN: 10.0000 mg | INTRAVENOUS | Status: DC | PRN

## 2021-01-15 MED ORDER — FENTANYL CITRATE (PF) 100 MCG/2ML IJ SOLN
INTRAMUSCULAR | Status: DC | PRN
  Administered 2021-01-15: 25 ug via INTRAVENOUS
  Administered 2021-01-15: 50 ug via INTRAVENOUS

## 2021-01-15 MED ORDER — METHYLPREDNISOLONE SODIUM SUCC 125 MG IJ SOLR: 125.0000 mg | Freq: Once | INTRAMUSCULAR | Status: DC | PRN

## 2021-01-15 MED ORDER — SODIUM CHLORIDE 0.9% FLUSH: 3.0000 mL | INTRAVENOUS | Status: DC | PRN

## 2021-01-15 MED ORDER — SODIUM CHLORIDE 0.9 % IV SOLN: 250.0000 mL | INTRAVENOUS | Status: DC | PRN

## 2021-01-15 MED ORDER — OXYCODONE HCL 5 MG PO TABS: 5.0000 mg | ORAL_TABLET | ORAL | Status: DC | PRN

## 2021-01-15 MED ORDER — SODIUM CHLORIDE 0.9% FLUSH: 3.0000 mL | Freq: Two times a day (BID) | INTRAVENOUS | Status: DC

## 2021-01-15 MED ORDER — MORPHINE SULFATE (PF) 4 MG/ML IV SOLN: 2.0000 mg | INTRAVENOUS | Status: DC | PRN

## 2021-01-15 MED ORDER — HYDRALAZINE HCL 20 MG/ML IJ SOLN: 5.0000 mg | INTRAMUSCULAR | Status: DC | PRN

## 2021-01-15 MED ORDER — MIDAZOLAM HCL 2 MG/2ML IJ SOLN
INTRAMUSCULAR | Status: DC | PRN
  Administered 2021-01-15: 1 mg via INTRAVENOUS
  Administered 2021-01-15: 2 mg via INTRAVENOUS

## 2021-01-15 SURGICAL SUPPLY — 14 items
CATH ANGIO 5F PIGTAIL 65CM (CATHETERS) ×1 IMPLANT
COVER PROBE U/S 5X48 (MISCELLANEOUS) ×2 IMPLANT
DEVICE STARCLOSE SE CLOSURE (Vascular Products) ×1 IMPLANT
DEVICE TORQUE (MISCELLANEOUS) ×1 IMPLANT
GLIDECATH 4FR STR (CATHETERS) ×1 IMPLANT
GUIDEWIRE ANGLED .035X260CM (WIRE) ×1 IMPLANT
GUIDEWIRE SUPER STIFF .035X180 (WIRE) ×1 IMPLANT
NDL ENTRY 21GA 7CM ECHOTIP (NEEDLE) IMPLANT
NEEDLE ENTRY 21GA 7CM ECHOTIP (NEEDLE) ×2 IMPLANT
PACK ANGIOGRAPHY (CUSTOM PROCEDURE TRAY) ×2 IMPLANT
SHEATH BRITE TIP 5FRX11 (SHEATH) ×1 IMPLANT
SYR MEDRAD MARK 7 150ML (SYRINGE) ×1 IMPLANT
TUBING CONTRAST HIGH PRESS 72 (TUBING) ×1 IMPLANT
WIRE GUIDERIGHT .035X150 (WIRE) ×1 IMPLANT

## 2021-01-15 NOTE — Op Note (Signed)
Brandon Lewis  Percutaneous Study/Intervention Procedural Note   Date of Surgery: 01/15/2021,2:14 PM  Surgeon:Wiatt Mahabir, Dolores Lory   Pre-operative Diagnosis: Atherosclerotic occlusive disease bilateral lower extremities with rest pain; complication of vascular graft with stricture stenosis  Post-operative diagnosis:  Same  Procedure(s) Performed:  1.  Abdominal aortogram  2.  Bilateral lower extremity distal runoff  3.  Placement catheter into left profunda femoris from right femoral approach  4.  Star close left femoral   Anesthesia: Conscious sedation was administered by the interventional radiology RN under my direct supervision. IV Versed plus fentanyl were utilized. Continuous ECG, pulse oximetry and blood pressure was monitored throughout the entire procedure.  Conscious sedation was administered for a total of 37 minutes and 43 seconds.  Sheath: 5 French 11 cm Pinnacle sheath right femoral retrograde  Contrast: 50 cc   Fluoroscopy Time: 4.1 minutes  Indications:  The patient presents to [facility] with rest pain symptoms to both feet.  Pedal pulses are nonpalpable bilaterally and his ABIs are quite diminished suggesting atherosclerotic occlusive disease.  He has a history of aortobifemoral bypass grafting in the remote past.  He also has documented SFA occlusions bilaterally. The risks and benefits as well as alternative therapies for lower extremity revascularization are reviewed with the patient all questions are answered the patient agrees to proceed.  The patient is therefore undergoing angiography with the hope for intervention for limb salvage.   Procedure:  Brandon Lewis a 87 y.o. male who was identified and appropriate procedural time out was performed.  The patient was then placed supine on the table and prepped and draped in the usual sterile fashion.  Ultrasound was used to evaluate the right common femoral artery.  It was echolucent  and pulsatile indicating it is patent .  An ultrasound image was acquired for the permanent record.  A micropuncture needle was used to access the right common femoral artery under direct ultrasound guidance.  The microwire was then advanced under fluoroscopic guidance without difficulty followed by the micro-sheath.  A 0.035 Amplatz wire was advanced without resistance and a 5Fr sheath was placed.    Pigtail catheter was then advanced to the level of T12 and AP projection of the aorta was obtained. Pigtail catheter was then repositioned to above the bifurcation of the aortobifemoral bypass and RAO view of the pelvis was obtained.  Floppy angled Glidewire and straight glide catheter was then used across the bifurcation and the catheter was positioned in the distal external iliac artery.  LAO of the left groin was then obtained. Wire was reintroduced and negotiated into the profunda femoris and the catheter was advanced into the profunda femoris. Distal runoff was then performed.  Once these images have been reviewed the catheter was removed the wire was advanced into the aorta and hand-injection of contrast through the right femoral sheath was performed so that the right lower extremity could be imaged.  After review of the images the catheter was removed over wire and an RAO view of the groin was obtained. StarClose device was deployed without difficulty.   Findings:   Aortogram: The abdominal aorta is opacified with a bolus injection contrast.  The visceral segment is widely patent.  There is rapid filling of the celiac and SMA.  Bilateral single renal arteries are noted.  No evidence of renal artery stenosis.  Just below the level of the renal arteries the aortobifemoral bypass graft is noted it appears to be an end-to-end anastomosis.  The bilateral iliac limbs of the bypass are widely patent there are no kinks or evidence of external compression or strictures noted.  Right Lower Extremity: The right  femoral anastomosis is viewed in both right and left oblique views as well as an AP view.  I still have a question as to whether there is any focal napkin ring like stricture noted but overall it appears to be patent filling the profunda femoris which has to large branches.  There is a third branch which may represent the right SFA but this occludes approximately 10 cm from the origin in the groin.  There is reconstitution of the SFA popliteal at Hunter's canal however it remains diffusely diseased although patent with greater than 5060% stenosis throughout its course.  The anterior tibial is occluded at its origin remains occluded throughout its entire course.  The tibioperoneal trunk demonstrates an 80% stenosis in its proximal portion.  The posterior tibial and peroneal are both patent down to the foot and I the dominant runoff.  Left Lower Extremity: The femoral anastomosis on the left demonstrates a 90% stricture at the level of the anastomosis.  There is no evidence for an SFA.  The profunda femoris demonstrates poststenotic dilatation but otherwise is quite large with numerous collaterals.  There is reconstitution of the SFA at the level of the femoral condyles.  Although slightly small this is patent and free of hemodynamically significant stenosis down to the tibioperoneal trunk which is also patent and there is two-vessel runoff via the peroneal and posterior tibial to the foot.  Anterior tibial is occluded at its origin remains occluded throughout its course.  Summary: The patient will require open revision of the left groin.  I will also obtain a duplex ultrasound of the right groin to try to assess whether there is a hemodynamically significant lesion at this level.  Imaging and all 3 views is still slightly ambiguous.   Disposition: Patient was taken to the recovery room in stable condition having tolerated the procedure well.  Brandon Lewis 01/15/2021,2:14 PM

## 2021-01-15 NOTE — Progress Notes (Signed)
BUN/CRE/GFR resulted. MD made aware. New orders received. IVF's increased to 150 ml/hr. Now.

## 2021-01-15 NOTE — Progress Notes (Signed)
Dr. Delana Meyer at bedside, speaking with pt. And his son re: procedural results. Both verbalize understanding of conversation and follow-up pland.

## 2021-01-15 NOTE — Interval H&P Note (Signed)
History and Physical Interval Note:  01/15/2021 11:54 AM  Brandon Lewis  has presented today for surgery, with the diagnosis of Left LE Angio   ASO w claudication Covid March 25.  The various methods of treatment have been discussed with the patient and family. After consideration of risks, benefits and other options for treatment, the patient has consented to  Procedure(s): LOWER EXTREMITY ANGIOGRAPHY (Left) as a surgical intervention.  The patient's history has been reviewed, patient examined, no change in status, stable for surgery.  I have reviewed the patient's chart and labs.  Questions were answered to the patient's satisfaction.     Hortencia Pilar

## 2021-01-16 ENCOUNTER — Encounter: Payer: Self-pay | Admitting: Vascular Surgery

## 2021-01-17 ENCOUNTER — Other Ambulatory Visit (INDEPENDENT_AMBULATORY_CARE_PROVIDER_SITE_OTHER): Payer: Self-pay | Admitting: Vascular Surgery

## 2021-01-17 DIAGNOSIS — I70201 Unspecified atherosclerosis of native arteries of extremities, right leg: Secondary | ICD-10-CM

## 2021-01-17 DIAGNOSIS — I739 Peripheral vascular disease, unspecified: Secondary | ICD-10-CM

## 2021-01-17 DIAGNOSIS — R0989 Other specified symptoms and signs involving the circulatory and respiratory systems: Secondary | ICD-10-CM | POA: Diagnosis not present

## 2021-01-17 DIAGNOSIS — E041 Nontoxic single thyroid nodule: Secondary | ICD-10-CM | POA: Diagnosis not present

## 2021-01-17 DIAGNOSIS — I6523 Occlusion and stenosis of bilateral carotid arteries: Secondary | ICD-10-CM | POA: Diagnosis not present

## 2021-01-18 ENCOUNTER — Other Ambulatory Visit: Admission: RE | Admit: 2021-01-18 | Payer: Medicare PPO | Source: Ambulatory Visit

## 2021-01-22 ENCOUNTER — Ambulatory Visit: Admission: RE | Admit: 2021-01-22 | Payer: Medicare PPO | Source: Ambulatory Visit | Admitting: Vascular Surgery

## 2021-01-22 ENCOUNTER — Encounter: Admission: RE | Payer: Self-pay | Source: Ambulatory Visit

## 2021-01-22 DIAGNOSIS — I70219 Atherosclerosis of native arteries of extremities with intermittent claudication, unspecified extremity: Secondary | ICD-10-CM

## 2021-01-22 SURGERY — LOWER EXTREMITY ANGIOGRAPHY
Anesthesia: Moderate Sedation | Site: Leg Lower | Laterality: Right

## 2021-01-28 ENCOUNTER — Ambulatory Visit (INDEPENDENT_AMBULATORY_CARE_PROVIDER_SITE_OTHER): Payer: Medicare PPO

## 2021-01-28 ENCOUNTER — Ambulatory Visit (INDEPENDENT_AMBULATORY_CARE_PROVIDER_SITE_OTHER): Payer: Medicare PPO | Admitting: Vascular Surgery

## 2021-01-28 ENCOUNTER — Encounter (INDEPENDENT_AMBULATORY_CARE_PROVIDER_SITE_OTHER): Payer: Self-pay | Admitting: Vascular Surgery

## 2021-01-28 ENCOUNTER — Other Ambulatory Visit: Payer: Self-pay

## 2021-01-28 VITALS — BP 127/68 | HR 84 | Ht 71.0 in | Wt 201.0 lb

## 2021-01-28 DIAGNOSIS — I2510 Atherosclerotic heart disease of native coronary artery without angina pectoris: Secondary | ICD-10-CM

## 2021-01-28 DIAGNOSIS — I739 Peripheral vascular disease, unspecified: Secondary | ICD-10-CM

## 2021-01-28 DIAGNOSIS — E785 Hyperlipidemia, unspecified: Secondary | ICD-10-CM

## 2021-01-28 DIAGNOSIS — I4892 Unspecified atrial flutter: Secondary | ICD-10-CM

## 2021-01-28 DIAGNOSIS — I2583 Coronary atherosclerosis due to lipid rich plaque: Secondary | ICD-10-CM | POA: Diagnosis not present

## 2021-01-28 DIAGNOSIS — I10 Essential (primary) hypertension: Secondary | ICD-10-CM | POA: Diagnosis not present

## 2021-01-28 DIAGNOSIS — I70201 Unspecified atherosclerosis of native arteries of extremities, right leg: Secondary | ICD-10-CM | POA: Diagnosis not present

## 2021-01-28 NOTE — Progress Notes (Signed)
MRN : [id number]  Brandon Lewis is a 87 y.o. (1934/02/05) male who presents with chief complaint of No chief complaint on file. Marland Kitchen  History of Present Illness:   Recommend:  The patient returns to the office for followup and review status post angiogram without intervention. The patient notes no change in the lower extremity symptoms. He continues to have claudication and rest pain symptoms.  No new ulcers or wounds have occurred since the last visit.  There have been no significant changes to the patient's overall health care.  The patient denies amaurosis fugax or recent TIA symptoms. There are no recent neurological changes noted. The patient denies history of DVT, PE or superficial thrombophlebitis. The patient denies recent episodes of angina or shortness of breath.   Duplex US of the right lower extremity arterial system shows the right limb of the aortobifemoral bypass appears to have a widely patent femoral anastomosis.  This corroborates the angiographic findings.  The angiogram is reviewed with the patient and his son.  The images are pulled up and discussed.  Given the angiographic findings and the duplex ultrasound findings I do not recommend revision of the right femoral anastomosis however the left femoral anastomosis clearly has a 90% stenosis and patency of the left limb of the aortobifemoral graft is threatened.  The patient does have bilateral SFA occlusions with two-vessel runoff to the foot bilaterally there is moderate disease on the right and the tibioperoneal trunk on the left TP trunk is widely patent.   No outpatient medications have been marked as taking for the 01/28/21 encounter (Appointment) with Delana Meyer, Dolores Lory, MD.    Past Medical History:  Diagnosis Date  . Bladder cancer ([place])   . CAD (coronary artery disease)    s/p PCI after presenting with exertional angina (drug eluting stent)  . Diverticulosis   . GERD (gastroesophageal reflux disease)    . HTN (hypertension)   . Hyperlipidemia   . Myocardial infarction ([place]) 09/2019   bypass and stent placed  . Osteoarthritis   . PVD (peripheral vascular disease) ([place])    s/p aortobifemoral bypass, with bilateral SFA occlusion and intermittent claudication   . Small bowel obstruction [facility])     Past Surgical History:  Procedure Laterality Date  . aorto-bifem  2003   [facility]   . CAROTID STENT     [place]  . CATARACT EXTRACTION, BILATERAL  2017  . CHOLECYSTECTOMY  2002  . CORONARY ARTERY BYPASS GRAFT N/A 09/16/2019   Procedure: CORONARY ARTERY BYPASS GRAFTING (CABG) using LIMA to LAD; Endoscopic right saphenous vein harvest to OM1, Diag1, and RCA.;  Surgeon: Wonda Olds, MD;  Location: [place];  Service: Open Heart Surgery;  Laterality: N/A;  . LEFT HEART CATH AND CORONARY ANGIOGRAPHY N/A 09/12/2019   Procedure: LEFT HEART CATH AND CORONARY ANGIOGRAPHY;  Surgeon: Corey Skains, MD;  Location: [place] [facility];  Service: Cardiovascular;  Laterality: N/A;  . LOWER EXTREMITY ANGIOGRAPHY Left 01/15/2021   Procedure: LOWER EXTREMITY ANGIOGRAPHY;  Surgeon: Katha Cabal, MD;  Location: [place] [facility];  Service: Cardiovascular;  Laterality: Left;  . TEE WITHOUT CARDIOVERSION N/A 09/16/2019   Procedure: TRANSESOPHAGEAL ECHOCARDIOGRAM (TEE);  Surgeon: Wonda Olds, MD;  Location: [place];  Service: Open Heart Surgery;  Laterality: N/A;  . TONSILLECTOMY    . TRANSURETHRAL RESECTION OF BLADDER TUMOR WITH MITOMYCIN-C N/A 07/24/2020   Procedure: TRANSURETHRAL RESECTION OF BLADDER TUMOR WITH Gemcitabine;  Surgeon: Abbie Sons, MD;  Location: [facility];  Service: Urology;  Laterality: N/A;  . WISDOM TOOTH EXTRACTION      Social History Social History   Tobacco Use  . Smoking status: Former Smoker    Packs/day: 1.00    Years: 20.00    Pack years: 20.00    Types: Cigarettes    Quit date: 10/20/1992    Years since quitting: 28.2  . Smokeless tobacco: Never Used  .  Tobacco comment: quit in 1994   Substance Use Topics  . Alcohol use: Not Currently    Comment: rare  . Drug use: No    Family History Family History  Problem Relation Age of Onset  . Diabetes Father        DM - and brother   . Stroke Father   . Coronary artery disease Paternal Grandfather   . Colon cancer Neg Hx   . Prostate cancer Neg Hx     Allergies  Allergen Reactions  . Cilostazol Other (See Comments)     stomach problems- constipation  . Simvastatin Other (See Comments)    myalgia     REVIEW OF SYSTEMS (Negative unless checked)  Constitutional: [] Weight loss  [] Fever  [] Chills Cardiac: [] Chest pain   [] Chest pressure   [] Palpitations   [] Shortness of breath when laying flat   [] Shortness of breath with exertion. Vascular:  [x] Pain in legs with walking   [x] Pain in legs at rest  [] History of DVT   [] Phlebitis   [] Swelling in legs   [] Varicose veins   [] Non-healing ulcers Pulmonary:   [] Uses home oxygen   [] Productive cough   [] Hemoptysis   [] Wheeze  [] COPD   [] Asthma Neurologic:  [] Dizziness   [] Seizures   [] History of stroke   [] History of TIA  [] Aphasia   [] Vissual changes   [] Weakness or numbness in arm   [] Weakness or numbness in leg Musculoskeletal:   [] Joint swelling   [x] Joint pain   [x] Low back pain Hematologic:  [] Easy bruising  [] Easy bleeding   [] Hypercoagulable state   [] Anemic Gastrointestinal:  [] Diarrhea   [] Vomiting  [] Gastroesophageal reflux/heartburn   [] Difficulty swallowing. Genitourinary:  [] Chronic kidney disease   [] Difficult urination  [] Frequent urination   [] Blood in urine Skin:  [] Rashes   [] Ulcers  Psychological:  [] History of anxiety   []  History of major depression.  Physical Examination  There were no vitals filed for this visit. There is no height or weight on file to calculate BMI. Gen: WD/WN, NAD Head: [place]/AT, No temporalis wasting.  Ear/Nose/Throat: Hearing grossly intact, nares w/o erythema or drainage Eyes: PER, EOMI, sclera  nonicteric.  Neck: Supple, no large masses.   Pulmonary:  Good air movement, no audible wheezing bilaterally, no use of accessory muscles.  Cardiac: RRR, no JVD Vascular:  Vessel Right Left  Radial Palpable Palpable  Femoral Palpable Palpable  Popliteal  not palpable  not palpable  PT  not palpable  not palpable  DP  not palpable  not palpable  Gastrointestinal: Non-distended. No guarding/no peritoneal signs.  Musculoskeletal: M/S 5/5 throughout.  No deformity or atrophy.  Neurologic: CN 2-12 intact. Symmetrical.  Speech is fluent. Motor exam as listed above. Psychiatric: Judgment intact, Mood & affect appropriate for pt's clinical situation. Dermatologic: No rashes or ulcers noted.  No changes consistent with cellulitis.  CBC Lab Results  Component Value Date   WBC 13.9 (H) 10/16/2020   HGB 9.9 (L) 10/16/2020   HCT 30.1 (L) 10/16/2020   MCV 120.9 (H) 10/16/2020   PLT  114 (L) 10/16/2020    BMET    Component Value Date/Time   NA 134 (L) 10/16/2020 0634   K 4.3 10/16/2020 0634   CL 106 10/16/2020 0634   CO2 20 (L) 10/16/2020 0634   GLUCOSE 174 (H) 10/16/2020 0634   BUN 23 01/15/2021 1204   CREATININE 1.58 (H) 01/15/2021 1204   CALCIUM 9.1 10/16/2020 0634   GFRNONAA 43 (L) 01/15/2021 1204   GFRAA 45 (L) 10/01/2019 0327   Estimated Creatinine Clearance: 39.2 mL/min (A) (by C-G formula based on SCr of 1.58 mg/dL (H)).  COAG Lab Results  Component Value Date   INR 1.3 (H) 09/16/2019   INR 1.1 09/14/2019   INR 1.0 09/11/2019    Radiology PERIPHERAL VASCULAR CATHETERIZATION  Result Date: 01/15/2021 See op note    Assessment/Plan 1. PAD (peripheral artery disease) (HCC)  Recommend:  The patient has evidence of severe atherosclerotic changes of both lower extremities associated with pain of the foot.  This represents a the potential for left aortobifemoral limb loss and places the patient at the risk for limb loss.  Angiography has been performed and the  situation is not ideal for intervention.  Given this finding open surgical repair is recommended.   Patient should undergo revision of the left femoral anastomosis of the left lower extremity with the hope for limb salvage.  The risks and benefits as well as the alternative therapies was discussed in detail with the patient.  All questions were answered.  Patient agrees to proceed with revision surgery.  I will obtain cardiac clearance from Dr. [name] and move forward with surgery after that.  The patient will follow up with me in the office after the procedure.   2. Coronary artery disease due to lipid rich plaque Continue cardiac and antihypertensive medications as already ordered and reviewed, no changes at this time.  Continue statin as ordered and reviewed, no changes at this time  Nitrates PRN for chest pain   3. Essential hypertension, benign Continue antihypertensive medications as already ordered, these medications have been reviewed and there are no changes at this time.   4. Atrial flutter, unspecified type ([place]) Continue antiarrhythmia medications as already ordered, these medications have been reviewed and there are no changes at this time.  Continue anticoagulation as ordered by Cardiology Service   5. Hyperlipidemia, unspecified hyperlipidemia type Continue statin as ordered and reviewed, no changes at this time    Hortencia Pilar, MD  01/28/2021 1:03 PM

## 2021-01-31 ENCOUNTER — Encounter (INDEPENDENT_AMBULATORY_CARE_PROVIDER_SITE_OTHER): Payer: Self-pay | Admitting: Vascular Surgery

## 2021-01-31 ENCOUNTER — Other Ambulatory Visit: Payer: Self-pay | Admitting: Internal Medicine

## 2021-02-05 ENCOUNTER — Telehealth (INDEPENDENT_AMBULATORY_CARE_PROVIDER_SITE_OTHER): Payer: Self-pay | Admitting: Vascular Surgery

## 2021-02-05 DIAGNOSIS — Z03818 Encounter for observation for suspected exposure to other biological agents ruled out: Secondary | ICD-10-CM | POA: Diagnosis not present

## 2021-02-05 DIAGNOSIS — E785 Hyperlipidemia, unspecified: Secondary | ICD-10-CM | POA: Diagnosis not present

## 2021-02-05 DIAGNOSIS — Z20822 Contact with and (suspected) exposure to covid-19: Secondary | ICD-10-CM | POA: Diagnosis not present

## 2021-02-05 DIAGNOSIS — I25119 Atherosclerotic heart disease of native coronary artery with unspecified angina pectoris: Secondary | ICD-10-CM | POA: Diagnosis not present

## 2021-02-05 DIAGNOSIS — I712 Thoracic aortic aneurysm, without rupture: Secondary | ICD-10-CM | POA: Diagnosis not present

## 2021-02-05 DIAGNOSIS — I10 Essential (primary) hypertension: Secondary | ICD-10-CM | POA: Diagnosis not present

## 2021-02-05 DIAGNOSIS — N1832 Chronic kidney disease, stage 3b: Secondary | ICD-10-CM | POA: Diagnosis not present

## 2021-02-05 DIAGNOSIS — Z951 Presence of aortocoronary bypass graft: Secondary | ICD-10-CM | POA: Diagnosis not present

## 2021-02-05 NOTE — Telephone Encounter (Signed)
Patient called to let Dr. Delana Meyer know that he has seen by is cardiologist today and was given the clear for his surgery for Dr. Charise Carwin.  I let patient know that there should be a cardio clearance form sent drom his cardiologist to [facility], but will pass the information on to staff.

## 2021-02-06 NOTE — Telephone Encounter (Signed)
As soon as a clearance is obtained the patient will be scheduled for surgery.

## 2021-02-07 ENCOUNTER — Other Ambulatory Visit: Payer: Self-pay

## 2021-02-07 ENCOUNTER — Ambulatory Visit: Payer: Medicare PPO | Admitting: Urology

## 2021-02-07 VITALS — BP 125/69 | HR 103 | Ht 70.0 in | Wt 200.0 lb

## 2021-02-07 DIAGNOSIS — D494 Neoplasm of unspecified behavior of bladder: Secondary | ICD-10-CM | POA: Diagnosis not present

## 2021-02-07 NOTE — Patient Instructions (Signed)

## 2021-02-08 ENCOUNTER — Telehealth: Payer: Self-pay

## 2021-02-08 ENCOUNTER — Encounter: Payer: Self-pay | Admitting: Urology

## 2021-02-08 DIAGNOSIS — Z03818 Encounter for observation for suspected exposure to other biological agents ruled out: Secondary | ICD-10-CM | POA: Diagnosis not present

## 2021-02-08 DIAGNOSIS — Z20822 Contact with and (suspected) exposure to covid-19: Secondary | ICD-10-CM | POA: Diagnosis not present

## 2021-02-08 LAB — URINALYSIS, COMPLETE
Bilirubin, UA: NEGATIVE
Glucose, UA: NEGATIVE
Leukocytes,UA: NEGATIVE
Nitrite, UA: NEGATIVE
RBC, UA: NEGATIVE
Specific Gravity, UA: 1.02 (ref 1.005–1.030)
Urobilinogen, Ur: 0.2 mg/dL (ref 0.2–1.0)
pH, UA: 5 (ref 5.0–7.5)

## 2021-02-08 LAB — MICROSCOPIC EXAMINATION: Bacteria, UA: NONE SEEN

## 2021-02-08 NOTE — Progress Notes (Signed)
   02/08/21  CC:  Chief Complaint  Patient presents with  . Cysto   Urologic history: 1.  Ta urothelial carcinoma bladder, high-grade -TURBT 07/24/2020; ~ 2 cm right posterior wall tumor -Induction BCG completed 11/09/2020 which was tolerated well   HPI: Mr. Dec presents for follow-up surveillance cystoscopy.  He has no complaints.  Denies gross hematuria  Blood pressure 125/69, pulse (!) 103, height 5\' 10"  (1.778 m), weight 200 lb (90.7 kg). NED. A&Ox3.   No respiratory distress   Abd soft, NT, ND Normal phallus with bilateral descended testicles  Cystoscopy Procedure Note  Patient identification was confirmed, informed consent was obtained, and patient was prepped using Betadine solution.  Lidocaine jelly was administered per urethral meatus.     Pre-Procedure: - Inspection reveals a normal caliber urethral meatus.  Procedure: The flexible cystoscope was introduced without difficulty - No urethral strictures/lesions are present. - Moderate lateral lobe enlargement prostate  - Mild elevation bladder neck - Bilateral ureteral orifices identified - Bladder mucosa  reveals no ulcers, tumors, or lesions -Scar prior TURBT without erythema or evidence of recurrence - No bladder stones - No trabeculation  Retroflexion shows no abnormalities   Post-Procedure: - Patient tolerated the procedure well  Assessment/ Plan:  No evidence recurrent urothelial carcinoma bladder  Discussed maintenance BCG and he desires to proceed  Schedule initial 3-week course next month then every 6 months  Follow-up surveillance cystoscopy 3 months   Abbie Sons, MD

## 2021-02-08 NOTE — Telephone Encounter (Addendum)
Pt said as a courtesy he was calling to let Dr Silvio Pate know that he was tested + for covid earlier this afternoon. Pt symptoms include runny nose and sinus congestion; no other symptoms and pt is in no distress; pt is self quaranting and drinking plenty of fluids and resting. Pt will cb on 02/11/21 with update. UC & ED precautions given and pt voiced understanding. Pt has had 3 covid immunizations.

## 2021-02-10 NOTE — Telephone Encounter (Signed)
Please check on him to consider referral for infusion or oral therapy.

## 2021-02-11 ENCOUNTER — Telehealth (INDEPENDENT_AMBULATORY_CARE_PROVIDER_SITE_OTHER): Payer: Self-pay

## 2021-02-11 NOTE — Telephone Encounter (Signed)
Okay No reason for referral then as fully immunized and improving

## 2021-02-11 NOTE — Telephone Encounter (Signed)
Spoke to pt. He said he is improving. Some head congestion. Monitoring O2 sats. Last reading was 98%.

## 2021-02-11 NOTE — Telephone Encounter (Signed)
Patient called in to let us know he tested positive for covid on 02/08/21. Patient also had two questions regarding his surgery as well. 1) How long is the hospital stay - 2-3 days, 2) How long is the recuperation time after surgery.- 2 weeks. Patient was given this information regarding his questions after speaking with Dr. Delana Meyer and patient was told that after  2 weeks I will get him scheduled for his surgery as long as he does not have any symptoms.

## 2021-02-18 NOTE — Telephone Encounter (Signed)
Spoke with the patient and he is scheduled with Dr. Delana Meyer for a left femoral endarterectomy with groin revision on 03/13/[facility] at [facility]. Patient will do a phone call pre-op on 03/04/21 between 8-1 pm. Patient was positive for covid on 02/08/21 so will not do another covid test. Pre-surgical instructions will be mailed.

## 2021-02-24 ENCOUNTER — Other Ambulatory Visit (INDEPENDENT_AMBULATORY_CARE_PROVIDER_SITE_OTHER): Payer: Self-pay | Admitting: Nurse Practitioner

## 2021-03-01 ENCOUNTER — Ambulatory Visit (INDEPENDENT_AMBULATORY_CARE_PROVIDER_SITE_OTHER): Payer: Medicare PPO | Admitting: Physician Assistant

## 2021-03-01 ENCOUNTER — Other Ambulatory Visit: Payer: Self-pay

## 2021-03-01 DIAGNOSIS — D494 Neoplasm of unspecified behavior of bladder: Secondary | ICD-10-CM | POA: Diagnosis not present

## 2021-03-01 LAB — MICROSCOPIC EXAMINATION: Bacteria, UA: NONE SEEN

## 2021-03-01 LAB — URINALYSIS, COMPLETE
Bilirubin, UA: NEGATIVE
Glucose, UA: NEGATIVE
Ketones, UA: NEGATIVE
Leukocytes,UA: NEGATIVE
Nitrite, UA: NEGATIVE
RBC, UA: NEGATIVE
Specific Gravity, UA: 1.025 (ref 1.005–1.030)
Urobilinogen, Ur: 0.2 mg/dL (ref 0.2–1.0)
pH, UA: 5 (ref 5.0–7.5)

## 2021-03-01 MED ORDER — BCG LIVE 50 MG IS SUSR
3.2400 mL | Freq: Once | INTRAVESICAL | Status: AC
  Administered 2021-03-01: 81 mg via INTRAVESICAL

## 2021-03-01 NOTE — Progress Notes (Signed)
BCG Bladder Instillation  BCG # 1 of 3  Due to Bladder Cancer patient is present today for a BCG treatment. Patient was cleaned and prepped in a sterile fashion with betadine. A 14FR catheter was inserted, urine return was noted 30ml, urine was yellow in color.  48ml of reconstituted BCG was instilled into the bladder. The catheter was then removed. Patient tolerated well, no complications were noted  Performed by: Debroah Loop, PA-C and Bradly Bienenstock, CMA  Follow up/ Additional notes: 1 week for BCG #2 of 3

## 2021-03-01 NOTE — Patient Instructions (Signed)
Right now through 4:45pm: Hold your urine and do your quarter turns every 15 minutes. 4:45pm-10:45pm today: Every time you urinate, pour 1/2 cup of bleach into the toilet and let it sit for 15 minutes prior to flushing. 10:45pm onward: Resume your normal routine.  Patient Education: (BCG) Into the Bladder (Intravesical Chemotherapy)  BCG is a vaccine which is used to prevent tuberculosis (TB).  But it's also a helpful treatment for some early bladder cancers.  When BCG goes directly into the bladder the treatment is described as intravesical.  BCG is a type of immunotherapy.  Immunotherapy stimulates the body's immune system to destroy cancer cells.  How it's given BCG treatment is given to you in an outpatient setting.  It takes a few minutes to administer and you can go home as soon as it's finished.  It might be a good idea to ask someone to bring you, particularly the fist time.  Unlike chemotherapy into the bladder, BCG treatment is never given immediately after surgery to remove bladder tumors.  There needs to be a delay usually of at least two weeks after surgery, before you can have it.  You won't be given treatment with BCG if you are unwell or have an infection in your urine.  You're usually asked to limit the amount you drink before your treatment.  This will help to increase the concentration of BCG in your bladder.  Drinking too much before your treatment may make your bladder feel uncomfortably full.  If you normally take water tablets (diuretics) take them later in the day after your treatment.  Your nurse or doctor will give you more advise about preparing for your treatment.  You will have a small tube (catheter) placed into your bladder.  Your doctor will then put the liquid vaccine directly into your bladder through the catheter and remove the catheter.  You will need to hold your urine for two hours afterwards.  This can be difficult but it's to give the treatment time to work.   You can walk around during this time.  When the treatment is over you can go to the toilet.  After your treatment there are some precautions you'll need to take.  This is because BCG is a live vaccine and other people shouldn't be exposed to it.  For the next six hours, you'll need to avoid your urine splashing on the toilet seat and getting any urine on your hands.  It might be easer for men to sit down when they're using an ordinary toilet although using a stand up urinal should be alright.  The main this is to avoid splashing urine and spreading the vaccine.  You will also be asked to put 1/2 cup undiluted bleach into the toilet to destroy any live vaccine and leave it for 15 minutes until you flush.  Side Effects Because BCG goes directly into the bladder most of the side effects are linked with the bladder.  They usually go away within one to two days after your treatment.  The most common ones are: -needing to pass urine often -pain when you pass urine -blood in urine -flu-like symptoms (tiredness, general aching and raised temperature)  Theses side effects should settle down within a day or two.  If they don't get better contact your doctor.  Drinking lots of fluids can help flush the drug out of your bladder and reduce some of these effects.  Taking Ibuprofen or Aleve is encouraged unless you have a condition   that would make these medications unsafe to take (renal failure, diabetes, gerd)  Rare side effects can include a continuing high temperature (fever), pain in your joints and a cough.  If you have any of these symptoms, or if you feel generally unwell, contact your doctor.  These symptoms could be a sign of a more serious infection (due to BCG) that needs to be treated immediately.  If this happens you'll be treated with the same drugs (antibiotics) that are used to treat TB.  Contraception Men should use a condom during sex for the first 48 hours after their treatment.  If you are a  women who has had BCG treatment then your partner should use a condom.  Using a condom will protect your partner from any vaccine present in your semen or vaginal fluid.  We don't know how BCG may affect a developing fetus so it's not advisable to become pregnant or father a child while having it.  It is important to use effective contraception during your treatment and for six weeks afterwards.  You can discuss this with your doctor or specialist nurse.

## 2021-03-04 ENCOUNTER — Other Ambulatory Visit: Payer: Self-pay

## 2021-03-04 ENCOUNTER — Telehealth: Payer: Self-pay | Admitting: Physician Assistant

## 2021-03-04 ENCOUNTER — Encounter
Admission: RE | Admit: 2021-03-04 | Discharge: 2021-03-04 | Disposition: A | Discharge status: Home / Self Care | Payer: Medicare PPO | Source: Ambulatory Visit | Attending: Vascular Surgery | Admitting: Vascular Surgery

## 2021-03-04 HISTORY — DX: Personal history of urinary calculi: Z87.442

## 2021-03-04 HISTORY — DX: Anemia, unspecified: D64.9

## 2021-03-04 HISTORY — DX: Nontoxic single thyroid nodule: E04.1

## 2021-03-04 HISTORY — DX: Thoracic aortic aneurysm, without rupture: I71.2

## 2021-03-04 HISTORY — DX: Chronic kidney disease, unspecified: N18.9

## 2021-03-04 HISTORY — DX: Aneurysm of the ascending aorta, without rupture: I71.21

## 2021-03-04 NOTE — Patient Instructions (Addendum)
Your procedure is scheduled on:03-13-21 WEDNESDAY  Report to the Registration Desk on the 1st floor of the [facility]-Then proceed to the 2nd floor Surgery Desk in the [facility] To find out your arrival time, please call [phone] between 1PM - 3PM on:03-12-21 TUESDAY  REMEMBER: Instructions that are not followed completely may result in serious medical risk, up to and including death; or upon the discretion of your surgeon and anesthesiologist your surgery may need to be rescheduled.  Do not eat food after midnight the night before surgery.  No gum chewing, lozengers or hard candies.  You may however, drink CLEAR liquids up to 2 hours before you are scheduled to arrive for your surgery. Do not drink anything within 2 hours of your scheduled arrival time.  Clear liquids include: - water  - apple juice without pulp - gatorade - black coffee or tea (Do NOT add milk or creamers to the coffee or tea) Do NOT drink anything that is not on this list.  TAKE THESE MEDICATIONS THE MORNING OF SURGERY WITH A SIP OF WATER: -METOPROLOL (TOPROL) -FLOMAX (TAMSULOSIN) -PROTONIX (PANTOPRAZOLE)-take one the night before and one on the morning of surgery - helps to prevent nausea after surgery.)  STOP YOUR PLAVIX (CLOPIDOGREL) 5 DAYS PRIOR TO SURGERY-LAST DOSE WILL BE ON Thursday 03-07-21. CONTINUE YOUR 81 MG ASPIRIN BUT DO NOT TAKE ASPIRIN THE MORNING OF SURGERY  One week prior to surgery: Stop Anti-inflammatories (NSAIDS) such as Advil, Aleve, Ibuprofen, Motrin, Naproxen, Naprosyn and Aspirin based products such as Excedrin, Goodys Powder, BC Powder-OK TO TAKE TYLENOL IF NEEDED  Stop ANY OVER THE COUNTER supplements/vitamins on 03-05-21 until after surgery.  No Alcohol for 24 hours before or after surgery.  No Smoking including e-cigarettes for 24 hours prior to surgery.  No chewable tobacco products for at least 6 hours prior to surgery.  No nicotine patches on the day of surgery.  Do not  use any "recreational" drugs for at least a week prior to your surgery.  Please be advised that the combination of cocaine and anesthesia may have negative outcomes, up to and including death. If you test positive for cocaine, your surgery will be cancelled.  On the morning of surgery brush your teeth with toothpaste and water, you may rinse your mouth with mouthwash if you wish. Do not swallow any toothpaste or mouthwash.  Do not wear jewelry, make-up, hairpins, clips or nail polish.  Do not wear lotions, powders, or perfumes.   Do not shave body from the neck down 48 hours prior to surgery just in case you cut yourself which could leave a site for infection.  Also, freshly shaved skin may become irritated if using the CHG soap.  Contact lenses, hearing aids and dentures may not be worn into surgery.  Do not bring valuables to the hospital. [facility] is not responsible for any missing/lost belongings or valuables.   Use CHG Soap as directed on instruction sheet.  Notify your doctor if there is any change in your medical condition (cold, fever, infection).  Wear comfortable clothing (specific to your surgery type) to the hospital.  Plan for stool softeners for home use; pain medications have a tendency to cause constipation. You can also help prevent constipation by eating foods high in fiber such as fruits and vegetables and drinking plenty of fluids as your diet allows.  After surgery, you can help prevent lung complications by doing breathing exercises.  Take deep breaths and cough every 1-2 hours.  Your doctor may order a device called an Incentive Spirometer to help you take deep breaths. When coughing or sneezing, hold a pillow firmly against your incision with both hands. This is called "splinting." Doing this helps protect your incision. It also decreases belly discomfort.  If you are being admitted to the hospital overnight, leave your suitcase in the car. After surgery it  may be brought to your room.  If you are being discharged the day of surgery, you will not be allowed to drive home. You will need a responsible adult (18 years or older) to drive you home and stay with you that night.   If you are taking public transportation, you will need to have a responsible adult (18 years or older) with you. Please confirm with your physician that it is acceptable to use public transportation.   Please call the [facility] Dept. at [phone] if you have any questions about these instructions.  Surgery Visitation Policy:  Patients undergoing a surgery or procedure may have one family member or support person with them as long as that person is not COVID-19 positive or experiencing its symptoms.  That person may remain in the waiting area during the procedure.  Inpatient Visitation:    Visiting hours are 7 a.m. to 8 p.m. Inpatients will be allowed two visitors daily. The visitors may change each day during the patient's stay. No visitors under the age of 49. Any visitor under the age of 55 must be accompanied by an adult. The visitor must pass COVID-19 screenings, use hand sanitizer when entering and exiting the patient's room and wear a mask at all times, including in the patient's room. Patients must also wear a mask when staff or their visitor are in the room. Masking is required regardless of vaccination status.

## 2021-03-04 NOTE — Telephone Encounter (Signed)
appt has been moved

## 2021-03-05 ENCOUNTER — Encounter
Admission: RE | Admit: 2021-03-05 | Discharge: 2021-03-05 | Disposition: A | Discharge status: Home / Self Care | Payer: Medicare PPO | Source: Ambulatory Visit | Attending: Vascular Surgery | Admitting: Vascular Surgery

## 2021-03-05 DIAGNOSIS — Z01810 Encounter for preprocedural cardiovascular examination: Secondary | ICD-10-CM | POA: Diagnosis not present

## 2021-03-05 DIAGNOSIS — Z01818 Encounter for other preprocedural examination: Secondary | ICD-10-CM | POA: Insufficient documentation

## 2021-03-05 LAB — CBC WITH DIFFERENTIAL/PLATELET
Abs Immature Granulocytes: 0.03 10*3/uL (ref 0.00–0.07)
Basophils Absolute: 0 10*3/uL (ref 0.0–0.1)
Basophils Relative: 0 %
Eosinophils Absolute: 0.1 10*3/uL (ref 0.0–0.5)
Eosinophils Relative: 1 %
HCT: 32.6 % — ABNORMAL LOW (ref 39.0–52.0)
Hemoglobin: 10.6 g/dL — ABNORMAL LOW (ref 13.0–17.0)
Immature Granulocytes: 0 %
Lymphocytes Relative: 25 %
Lymphs Abs: 1.8 10*3/uL (ref 0.7–4.0)
MCH: 38.7 pg — ABNORMAL HIGH (ref 26.0–34.0)
MCHC: 32.5 g/dL (ref 30.0–36.0)
MCV: 119 fL — ABNORMAL HIGH (ref 80.0–100.0)
Monocytes Absolute: 0.7 10*3/uL (ref 0.1–1.0)
Monocytes Relative: 10 %
Neutro Abs: 4.6 10*3/uL (ref 1.7–7.7)
Neutrophils Relative %: 64 %
Platelets: 124 10*3/uL — ABNORMAL LOW (ref 150–400)
RBC: 2.74 MIL/uL — ABNORMAL LOW (ref 4.22–5.81)
RDW: 17.2 % — ABNORMAL HIGH (ref 11.5–15.5)
Smear Review: NORMAL
WBC: 7.2 10*3/uL (ref 4.0–10.5)
nRBC: 0 % (ref 0.0–0.2)

## 2021-03-05 LAB — BASIC METABOLIC PANEL
Anion gap: 6 (ref 5–15)
BUN: 26 mg/dL — ABNORMAL HIGH (ref 8–23)
CO2: 24 mmol/L (ref 22–32)
Calcium: 9.4 mg/dL (ref 8.9–10.3)
Chloride: 106 mmol/L (ref 98–111)
Creatinine, Ser: 1.43 mg/dL — ABNORMAL HIGH (ref 0.61–1.24)
GFR, Estimated: 48 mL/min — ABNORMAL LOW (ref >60)
Glucose, Bld: 122 mg/dL — ABNORMAL HIGH (ref 70–99)
Potassium: 4.5 mmol/L (ref 3.5–5.1)
Sodium: 136 mmol/L (ref 135–145)

## 2021-03-05 LAB — TYPE AND SCREEN
ABO/RH(D): O POS
Antibody Screen: NEGATIVE

## 2021-03-05 LAB — APTT: aPTT: 32 seconds (ref 24–36)

## 2021-03-05 LAB — PROTIME-INR
INR: 1.1 (ref 0.8–1.2)
Prothrombin Time: 13.8 seconds (ref 11.4–15.2)

## 2021-03-07 ENCOUNTER — Telehealth: Payer: Self-pay

## 2021-03-07 NOTE — Telephone Encounter (Signed)
Due to BCG back order patient was contacted per Dr. 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 Ideally patient would have been added on to a split dose treatment therapy but due to patient's surgery scheduled for next week we would want to err on the side of caution and not leave him open to possible infection prior to procedure or directly after. This was explained in detail to the patient and he verbalized understanding. He was told to keep his 3 mo cysto apt in July and will discuss possible resuming of maintenance treatments at that time if there is no longer medication allocation.

## 2021-03-08 ENCOUNTER — Encounter: Payer: Self-pay | Admitting: Vascular Surgery

## 2021-03-08 ENCOUNTER — Ambulatory Visit: Payer: Medicare PPO | Admitting: Physician Assistant

## 2021-03-08 NOTE — Progress Notes (Signed)
Perioperative Services  Pre-Admission/Anesthesia Testing Clinical Review  Date: 03/08/21  Patient Demographics:  Name: Brandon Lewis DOB:   1934-08-06 MRN:   [id number]  Planned Surgical Procedure(s):    Case: [id number] Date/Time: 03/13/21 0715   Procedures:      ENDARTERECTOMY FEMORAL (GROIN REVISION) (Left )     APPLICATION OF CELL SAVER (N/A )   Anesthesia type: General   Pre-op diagnosis: PAD   Location: [facility] OR ROOM 07 / [place] [facility]   Surgeons: Katha Cabal, MD    NOTE: Available PAT nursing documentation and vital signs have been reviewed. Clinical nursing staff has updated patient's PMH/PSHx, current medication list, and drug allergies/intolerances to ensure comprehensive history available to assist in medical decision making as it pertains to the aforementioned surgical procedure and anticipated anesthetic course.   Clinical Discussion:  Brandon Lewis is a 87 y.o. male who is submitted for pre-surgical anesthesia review and clearance prior to him undergoing the above procedure. Patient is a Former Smoker (20 pack years; quit 10/1992). Pertinent PMH includes: atrial flutter, aortic atherosclerosis, thoracic aortic aneurysm , CAD,  MI (2020; s/p CABG x 4), PAD (s/p femoral artery bypass graft in 2003), HTN, HLD, CKD-III, OA.  Patient is followed by cardiology [name], MD). He was last seen in the cardiology clinic on 02/05/2021; notes reviewed.  At the time of his clinic visit, patient noted to be doing well overall from a cardiovascular perspective. He denied chest pain, shortness of breath, orthopnea, PND, palpitations, peripheral edema, vertiginous symptoms, and presyncope/syncope. He complained of claudication pain in his lower extremities associated with ambulation. Patient with a significant cardiovascular history.   Patient s/p aortobifemoral bypass grafting that was performed in 2003.    TTE done on 09/11/2019 revealed normal LV systolic  function; LVEF 50 to 55%.     Subsequent heart catheterization on 09/12/2019 revealed severe LAD and RCA stenosis. High grade disease not amenable to PCI, thus patient was transferred from [facility] to [facility] for further evaluation and treatment.  Patient underwent CABG x 4 at [facility] on 09/16/2019 with a LIMA-LAD, SVG-RCA, SVG-OM1, and SVG-D1.     Post CABG TEE on 09/16/2019 revealed normal LV and RV systolic function; LVEF greater than 55%.    Repeat TTE done on 11/24/2019 remains unchanged from previous.     Exercise stress test done on 04/30/2020 revealed no evidence of ischemia or arrhythmia, however testing had to be truncated, after 2:06, due to severe claudication symptoms (see full interpretation from cardiovascular testing below).  Patient on GDMT for his HTN and HLD diagnoses.  Blood pressure well controlled at 110/74 on currently prescribed beta-blocker monotherapy.  Patient on a statin for his HLD.  He remains on daily DAPT therapy (ASA + clopidogrel); compliant with therapy with no evidence of GI bleeding. Functional capacity, as defined by DASI, is documented as being </= 4 METS.  No changes were made to patient's medication regimen.  Patient to follow-up with outpatient cardiology in 6 months or sooner if needed.  Patient is scheduled for a femoral endarterectomy on 03/13/2021 with Dr. Hortencia Pilar.  Given patient's past medical history significant for cardiovascular diagnoses, presurgical cardiac clearance was sought by the performing surgeon's office and PAT team.  Per cardiology, "patient is at the lowest risk possible for perioperative cardiovascular complication.  Risk of surgery is felt to be low (<1%).  Patient may proceed with an overall LOW risk for cardiovascular complications".  Again, this patient is  on daily DAPT therapy. He has been instructed on recommendations for holding his clopidogrel for 5 days prior to his procedure with plans to restart as soon as  postoperative bleeding risk felt to be minimized by his attending surgeon. The patient has been instructed that his last dose of his anticoagulant will be on 03/07/2021.  Patient to continue his daily low-dose ASA throughout the perioperative period.  Patient denies previous perioperative complications with anesthesia in the past. In review of the available records, it is noted that patient underwent a general anesthetic course here (ASA III) in 07/2020 without documented complications.   Vitals with BMI 02/07/2021 01/28/2021 01/15/2021  Height 5\' 10"  5\' 11"  -  Weight 200 lbs 201 lbs -  BMI 56.2 13.08 -  Systolic 657 846 962  Diastolic 69 68 80  Pulse 952 84 81    Providers/Specialists:   NOTE: Primary physician provider listed below. Patient may have been seen by APP or partner within same practice.   PROVIDER ROLE / SPECIALTY LAST OV  Schnier, Dolores Lory, MD  Vascular Surgery  01/28/2021  Venia Carbon, MD  Primary Care Provider  10/18/2020  Serafina Royals, MD  Cardiology  02/05/2021  John Giovanni, MD  Urology  03/01/2021   Allergies:  Cilostazol and Simvastatin  Current Home Medications:   No current facility-administered medications for this encounter.   Marland Kitchen aspirin EC 81 MG tablet  . Cholecalciferol (VITAMIN D) 50 MCG (2000 UT) CAPS  . clopidogrel (PLAVIX) 75 MG tablet  . hydrocortisone (ANUSOL-HC) 2.5 % rectal cream  . MAGNESIUM GLYCINATE PO  . metoprolol succinate (TOPROL-XL) 50 MG 24 hr tablet  . Multiple Vitamin (MULTIVITAMIN WITH MINERALS) TABS tablet  . pantoprazole (PROTONIX) 40 MG tablet  . rosuvastatin (CRESTOR) 10 MG tablet  . sodium chloride (OCEAN) 0.65 % nasal spray  . tamsulosin (FLOMAX) 0.4 MG CAPS capsule   History:   Past Medical History:  Diagnosis Date  . Anemia    h/o  . Bladder cancer ([place])    still receiving treatments  . CAD (coronary artery disease)    s/p PCI after presenting with exertional angina (drug eluting stent)  . Chronic  kidney disease    STAGE 3B  . Diverticulosis   . GERD (gastroesophageal reflux disease)   . History of kidney stones    h/o  . HTN (hypertension)   . Hyperlipidemia   . Myocardial infarction ([place]) 09/2019   bypass and stent placed  . Osteoarthritis   . PVD (peripheral vascular disease) ([place])    s/p aortobifemoral bypass, with bilateral SFA occlusion and intermittent claudication   . Small bowel obstruction ([place])    after surgeries  . Thoracic ascending aortic aneurysm ([place])   . Thyroid nodule    Past Surgical History:  Procedure Laterality Date  . aorto-bifem  2003   [place]   . CAROTID STENT     [place]  . CATARACT EXTRACTION, BILATERAL  2017  . CHOLECYSTECTOMY  2002  . COLONOSCOPY    . CORONARY ARTERY BYPASS GRAFT N/A 09/16/2019   Procedure: CORONARY ARTERY BYPASS GRAFTING (CABG) using LIMA to LAD; Endoscopic right saphenous vein harvest to OM1, Diag1, and RCA.;  Surgeon: Wonda Olds, MD;  Location: [place];  Service: Open Heart Surgery;  Laterality: N/A;  . LEFT HEART CATH AND CORONARY ANGIOGRAPHY N/A 09/12/2019   Procedure: LEFT HEART CATH AND CORONARY ANGIOGRAPHY;  Surgeon: Corey Skains, MD;  Location: [place] [facility];  Service: Cardiovascular;  Laterality:  N/A;  . LITHOTRIPSY    . LOWER EXTREMITY ANGIOGRAPHY Left 01/15/2021   Procedure: LOWER EXTREMITY ANGIOGRAPHY;  Surgeon: Katha Cabal, MD;  Location: [place] [facility];  Service: Cardiovascular;  Laterality: Left;  . TEE WITHOUT CARDIOVERSION N/A 09/16/2019   Procedure: TRANSESOPHAGEAL ECHOCARDIOGRAM (TEE);  Surgeon: Wonda Olds, MD;  Location: [place];  Service: Open Heart Surgery;  Laterality: N/A;  . TONSILLECTOMY    . TRANSURETHRAL RESECTION OF BLADDER TUMOR WITH MITOMYCIN-C N/A 07/24/2020   Procedure: TRANSURETHRAL RESECTION OF BLADDER TUMOR WITH Gemcitabine;  Surgeon: Abbie Sons, MD;  Location: [facility];  Service: Urology;  Laterality: N/A;  . WISDOM TOOTH EXTRACTION     Family  History  Problem Relation Age of Onset  . Diabetes Father        DM - and brother   . Stroke Father   . Coronary artery disease Paternal Grandfather   . Colon cancer Neg Hx   . Prostate cancer Neg Hx    Social History   Tobacco Use  . Smoking status: Former Smoker    Packs/day: 1.00    Years: 20.00    Pack years: 20.00    Types: Cigarettes    Quit date: 10/20/1992    Years since quitting: 28.4  . Smokeless tobacco: Never Used  . Tobacco comment: quit in 1994   Vaping Use  . Vaping Use: Never used  Substance Use Topics  . Alcohol use: Yes    Comment: rare  . Drug use: No    Pertinent Clinical Results:  LABS: Labs reviewed: Acceptable for surgery.  No visits with results within 3 Day(s) from this visit.  Latest known visit with results is:  Hospital Outpatient Visit on 03/05/2021  Component Date Value Ref Range Status  . WBC 03/05/2021 7.2  4.0 - 10.5 K/uL Final  . RBC 03/05/2021 2.74* 4.22 - 5.81 MIL/uL Final  . Hemoglobin 03/05/2021 10.6* 13.0 - 17.0 g/dL Final  . HCT 03/05/2021 32.6* 39.0 - 52.0 % Final  . MCV 03/05/2021 119.0* 80.0 - 100.0 fL Final  . MCH 03/05/2021 38.7* 26.0 - 34.0 pg Final  . MCHC 03/05/2021 32.5  30.0 - 36.0 g/dL Final  . RDW 03/05/2021 17.2* 11.5 - 15.5 % Final  . Platelets 03/05/2021 124* 150 - 400 K/uL Final  . nRBC 03/05/2021 0.0  0.0 - 0.2 % Final  . Neutrophils Relative % 03/05/2021 64  % Final  . Neutro Abs 03/05/2021 4.6  1.7 - 7.7 K/uL Final  . Lymphocytes Relative 03/05/2021 25  % Final  . Lymphs Abs 03/05/2021 1.8  0.7 - 4.0 K/uL Final  . Monocytes Relative 03/05/2021 10  % Final  . Monocytes Absolute 03/05/2021 0.7  0.1 - 1.0 K/uL Final  . Eosinophils Relative 03/05/2021 1  % Final  . Eosinophils Absolute 03/05/2021 0.1  0.0 - 0.5 K/uL Final  . Basophils Relative 03/05/2021 0  % Final  . Basophils Absolute 03/05/2021 0.0  0.0 - 0.1 K/uL Final  . WBC Morphology 03/05/2021 MORPHOLOGY UNREMARKABLE   Final  . Smear Review  03/05/2021 Normal platelet morphology   Final  . Immature Granulocytes 03/05/2021 0  % Final  . Abs Immature Granulocytes 03/05/2021 0.03  0.00 - 0.07 K/uL Final   Performed at [facility], [address] [place], [place] [address]  . Sodium 03/05/2021 136  135 - 145 mmol/L Final  . Potassium 03/05/2021 4.5  3.5 - 5.1 mmol/L Final  . Chloride 03/05/2021 106  98 - 111 mmol/L Final  . CO2 03/05/2021 24  22 - 32 mmol/L Final  . Glucose, Bld 03/05/2021 122* 70 - 99 mg/dL Final   Glucose reference range applies only to samples taken after fasting for at least 8 hours.  . BUN 03/05/2021 26* 8 - 23 mg/dL Final  . Creatinine, Ser 03/05/2021 1.43* 0.61 - 1.24 mg/dL Final  . Calcium 03/05/2021 9.4  8.9 - 10.3 mg/dL Final  . GFR, Estimated 03/05/2021 48* >60 mL/min Final   Comment: (NOTE) Calculated using the CKD-EPI Creatinine Equation (2021)   . Anion gap 03/05/2021 6  5 - 15 Final   Performed at [facility], [address] [place], [place] [address]  . Prothrombin Time 03/05/2021 13.8  11.4 - 15.2 seconds Final  . INR 03/05/2021 1.1  0.8 - 1.2 Final   Comment: (NOTE) INR goal varies based on device and disease states. Performed at [facility], [address], [place], [place] [address]   . aPTT 03/05/2021 32  24 - 36 seconds Final   Performed at [facility], [address] [place], [place] [address]  . ABO/RH(D) 03/05/2021 O POS   Final  . Antibody Screen 03/05/2021 NEG   Final  . Sample Expiration 03/05/2021 03/19/2021,2359   Final  . Extend sample reason 03/05/2021    Final                   Value:NO TRANSFUSIONS OR PREGNANCY IN THE PAST 3 MONTHS Performed at [facility], [address] [place], [place] [address]     ECG: Date: 03/05/2021 Time ECG obtained: 1343 PM Rate: 88 bpm Rhythm: normal sinus Axis (leads I and aVF): Normal Intervals: PR 158 ms. QRS 86 ms. QTc 435 ms. ST segment and T wave changes: No evidence of  acute ST segment elevation or depression Comparison: Similar to previous tracing obtained on 10/16/2020   IMAGING / PROCEDURES: CTA CHEST, ABDOMEN, PELVIS performed on 10/16/2020 1. No evidence of thoracic or abdominal aortic dissection or aneurysm is noted. 2. Status post stent graft repair of infrarenal abdominal aorta. The graft and its limbs are widely patent. 3. Severe narrowing is seen involving visualized portions of proximal superficial femoral arteries bilaterally secondary to atheromatous disease 4. 2.2 cm right thyroid nodule is noted. Recommend thyroid US 5. Diverticulosis of distal descending colon and proximal sigmoid colon is noted with minimal surrounding inflammatory changes which may represent mild diverticulitis 6. Stable 1 cm perifissural nodule is noted in right middle lobe; follow-up unenhanced chest CT in 6 months is recommended to ensure stability and rule out neoplasm 7. Small fat containing left inguinal hernia is noted 8. Mild prostatic enlargement 9. Aortic atherosclerosis  CT HEMATURIA WORKUP done on 06/15/2020 1. 18 x 13 x 8 mm polypoid enhancing soft tissue lesion along the posterior bladder dome, highly suspicious for urothelial neoplasm. 2. No evidence for metastatic disease in the abdomen or pelvis. 3. Hepatic and renal cysts. 4. Left groin hernia contains only fat. 5. Status post aorto bi femoral bypass graft. 6. 9 mm perifissural nodule in the right middle lobe stable since chest CT 09/14/2019. Continued attention on follow-up recommended.  EXERCISE STRESS TEST done on 04/30/2020 1. Exercised on Bruce protocol for 02:06 with truncation of test due to and severe claudication 2. Functional capacity 4.6 METS 3. Resting heart rate of 80 bpm rose to a maximum heart rate of 115 (84% of maximal age-predicted heart rate); normal response to exercise  4. Resting blood pressure of 142/86 mmHg rose to a maximum blood pressure 200/84 mmHg; appropriate response to  exercise 5. Resting ECG normal with no evidence of ischemia, arrhythmia, or ST changes  TRANSTHORACIC ECHOCARDIOGRAM done on 11/28/2019 1. LVEF 55% 2. Normal LV systolic function with mild LVH 3. Normal RV systolic function 4. Mild TR and PR; trivial MR; no AR 5. No valvular stenosis  TRANSESOPHAGEAL ECHOCARDIOGRAM done on 09/16/2019 Post Bypass:  1. Tricuspid, Pulmonic, Mitral and Aortic valve unchanged.  2. CO & CI wnl.  3. LVEF > 55%, RWA improved. 4. No dissection noted after cannula removed.   LEFT HEART CATHETERIZATION AND CORONARY ANGIOGRAPHY done on 09/12/2019 1. Echocardiogram and LV angiogram showing normal LV systolic function with an LVEF of 55 to 60%. 2. Mid LM to distal LM lesion is 85% stenosed 3. Proximal circumflex lesions 50% stenosed 4. Proximal LAD to mid LAD lesion is 85% stenosed 5. Proximal LAD lesion is 70% stenosed 6. Mid LAD lesion is 45% stenosed 7. Proximal RCA to mid RCA lesion 70% stenosed   Impression and Plan:  UNKOWN DEFRONZO has been referred for pre-anesthesia review and clearance prior to him undergoing the planned anesthetic and procedural courses. Available labs, pertinent testing, and imaging results were personally reviewed by me. This patient has been appropriately cleared by cardiology with an overall LOW risk of significant perioperative cardiovascular complications.  Based on clinical review performed today (03/08/21), barring any significant acute changes in the patient's overall condition, it is anticipated that he will be able to proceed with the planned surgical intervention. Any acute changes in clinical condition may necessitate his procedure being postponed and/or cancelled. Patient will meet with anesthesia team (MD and/or CRNA) on the day of his procedure for preoperative evaluation/assessment. Questions regarding anesthetic course will be fielded at that time.   Pre-surgical instructions were reviewed with the patient during  his PAT appointment and questions were fielded by PAT clinical staff. Patient was advised that if any questions or concerns arise prior to his procedure then he should return a call to PAT and/or his surgeon's office to discuss.  Honor Loh, MSN, APRN, FNP-C, CEN [facility]  Peri-operative Services Nurse Practitioner Phone: [phone] 03/08/21 11:46 AM  NOTE: This note has been prepared using Dragon dictation software. Despite my best ability to proofread, there is always the potential that unintentional transcriptional errors may still occur from this process.

## 2021-03-11 ENCOUNTER — Telehealth: Payer: Self-pay | Admitting: *Deleted

## 2021-03-11 ENCOUNTER — Other Ambulatory Visit: Payer: Self-pay | Admitting: Internal Medicine

## 2021-03-11 ENCOUNTER — Other Ambulatory Visit: Admission: RE | Admit: 2021-03-11 | Payer: Medicare PPO | Source: Ambulatory Visit

## 2021-03-12 MED ORDER — LACTATED RINGERS IV SOLN: INTRAVENOUS | Status: DC

## 2021-03-12 MED ORDER — ORAL CARE MOUTH RINSE: 15.0000 mL | Freq: Once | OROMUCOSAL | Status: AC

## 2021-03-12 MED ORDER — CHLORHEXIDINE GLUCONATE CLOTH 2 % EX PADS
6.0000 | MEDICATED_PAD | Freq: Once | CUTANEOUS | Status: AC
  Administered 2021-03-13: 6 via TOPICAL

## 2021-03-12 MED ORDER — CHLORHEXIDINE GLUCONATE 0.12 % MT SOLN: 15.0000 mL | Freq: Once | OROMUCOSAL | Status: AC

## 2021-03-12 MED ORDER — CHLORHEXIDINE GLUCONATE CLOTH 2 % EX PADS
6.0000 | MEDICATED_PAD | Freq: Once | CUTANEOUS | Status: AC
  Administered 2021-03-12: 6 via TOPICAL

## 2021-03-12 MED ORDER — CEFAZOLIN SODIUM-DEXTROSE 2-4 GM/100ML-% IV SOLN
2.0000 g | INTRAVENOUS | Status: AC
  Administered 2021-03-13: 2 g via INTRAVENOUS

## 2021-03-12 NOTE — Telephone Encounter (Signed)
Last filled on 11/17/19 # 90, 3 refills  LOV 10/18/20 acute Next appointment on 05/22/21 AWV

## 2021-03-13 ENCOUNTER — Encounter: Payer: Self-pay | Admitting: Vascular Surgery

## 2021-03-13 ENCOUNTER — Other Ambulatory Visit: Payer: Self-pay

## 2021-03-13 ENCOUNTER — Encounter
Admission: RE | Disposition: A | Discharge status: Home / Self Care | Payer: Self-pay | Source: Ambulatory Visit | Attending: Vascular Surgery

## 2021-03-13 ENCOUNTER — Inpatient Hospital Stay
Admission: RE | Admit: 2021-03-13 | Discharge: 2021-03-15 | DRG: 253 | Disposition: A | Discharge status: Home / Self Care | Payer: Medicare PPO | Source: Ambulatory Visit | Attending: Vascular Surgery | Admitting: Vascular Surgery

## 2021-03-13 ENCOUNTER — Inpatient Hospital Stay: Payer: Medicare PPO | Admitting: Urgent Care

## 2021-03-13 DIAGNOSIS — Z8551 Personal history of malignant neoplasm of bladder: Secondary | ICD-10-CM

## 2021-03-13 DIAGNOSIS — Z7902 Long term (current) use of antithrombotics/antiplatelets: Secondary | ICD-10-CM

## 2021-03-13 DIAGNOSIS — I700 Atherosclerosis of aorta: Secondary | ICD-10-CM | POA: Diagnosis present

## 2021-03-13 DIAGNOSIS — Z79899 Other long term (current) drug therapy: Secondary | ICD-10-CM | POA: Diagnosis not present

## 2021-03-13 DIAGNOSIS — Z823 Family history of stroke: Secondary | ICD-10-CM | POA: Diagnosis not present

## 2021-03-13 DIAGNOSIS — I70212 Atherosclerosis of native arteries of extremities with intermittent claudication, left leg: Secondary | ICD-10-CM | POA: Diagnosis not present

## 2021-03-13 DIAGNOSIS — I4892 Unspecified atrial flutter: Secondary | ICD-10-CM | POA: Diagnosis not present

## 2021-03-13 DIAGNOSIS — Z8249 Family history of ischemic heart disease and other diseases of the circulatory system: Secondary | ICD-10-CM | POA: Diagnosis not present

## 2021-03-13 DIAGNOSIS — E785 Hyperlipidemia, unspecified: Secondary | ICD-10-CM | POA: Diagnosis present

## 2021-03-13 DIAGNOSIS — I2510 Atherosclerotic heart disease of native coronary artery without angina pectoris: Secondary | ICD-10-CM | POA: Diagnosis present

## 2021-03-13 DIAGNOSIS — I2583 Coronary atherosclerosis due to lipid rich plaque: Secondary | ICD-10-CM | POA: Diagnosis present

## 2021-03-13 DIAGNOSIS — Z9049 Acquired absence of other specified parts of digestive tract: Secondary | ICD-10-CM | POA: Diagnosis not present

## 2021-03-13 DIAGNOSIS — Z9861 Coronary angioplasty status: Secondary | ICD-10-CM | POA: Diagnosis not present

## 2021-03-13 DIAGNOSIS — I712 Thoracic aortic aneurysm, without rupture: Secondary | ICD-10-CM | POA: Diagnosis present

## 2021-03-13 DIAGNOSIS — Z888 Allergy status to other drugs, medicaments and biological substances status: Secondary | ICD-10-CM | POA: Diagnosis not present

## 2021-03-13 DIAGNOSIS — Z87891 Personal history of nicotine dependence: Secondary | ICD-10-CM | POA: Diagnosis not present

## 2021-03-13 DIAGNOSIS — I70202 Unspecified atherosclerosis of native arteries of extremities, left leg: Secondary | ICD-10-CM | POA: Diagnosis present

## 2021-03-13 DIAGNOSIS — Z833 Family history of diabetes mellitus: Secondary | ICD-10-CM | POA: Diagnosis not present

## 2021-03-13 DIAGNOSIS — Z7982 Long term (current) use of aspirin: Secondary | ICD-10-CM | POA: Diagnosis not present

## 2021-03-13 DIAGNOSIS — M1990 Unspecified osteoarthritis, unspecified site: Secondary | ICD-10-CM | POA: Diagnosis present

## 2021-03-13 DIAGNOSIS — I10 Essential (primary) hypertension: Secondary | ICD-10-CM | POA: Diagnosis present

## 2021-03-13 DIAGNOSIS — I70222 Atherosclerosis of native arteries of extremities with rest pain, left leg: Secondary | ICD-10-CM | POA: Diagnosis not present

## 2021-03-13 DIAGNOSIS — K219 Gastro-esophageal reflux disease without esophagitis: Secondary | ICD-10-CM | POA: Diagnosis present

## 2021-03-13 DIAGNOSIS — I70229 Atherosclerosis of native arteries of extremities with rest pain, unspecified extremity: Secondary | ICD-10-CM | POA: Diagnosis present

## 2021-03-13 DIAGNOSIS — I252 Old myocardial infarction: Secondary | ICD-10-CM

## 2021-03-13 DIAGNOSIS — Z951 Presence of aortocoronary bypass graft: Secondary | ICD-10-CM | POA: Diagnosis not present

## 2021-03-13 DIAGNOSIS — I739 Peripheral vascular disease, unspecified: Secondary | ICD-10-CM | POA: Diagnosis not present

## 2021-03-13 HISTORY — PX: ENDARTERECTOMY FEMORAL: SHX5804

## 2021-03-13 HISTORY — DX: Atherosclerosis of aorta: I70.0

## 2021-03-13 LAB — CBC
HCT: 28.5 % — ABNORMAL LOW (ref 39.0–52.0)
Hemoglobin: 9.4 g/dL — ABNORMAL LOW (ref 13.0–17.0)
MCH: 39.7 pg — ABNORMAL HIGH (ref 26.0–34.0)
MCHC: 33 g/dL (ref 30.0–36.0)
MCV: 120.3 fL — ABNORMAL HIGH (ref 80.0–100.0)
Platelets: 121 10*3/uL — ABNORMAL LOW (ref 150–400)
RBC: 2.37 MIL/uL — ABNORMAL LOW (ref 4.22–5.81)
RDW: 17.1 % — ABNORMAL HIGH (ref 11.5–15.5)
WBC: 7.6 10*3/uL (ref 4.0–10.5)
nRBC: 0 % (ref 0.0–0.2)

## 2021-03-13 LAB — CREATININE, SERUM
Creatinine, Ser: 1.37 mg/dL — ABNORMAL HIGH (ref 0.61–1.24)
GFR, Estimated: 51 mL/min — ABNORMAL LOW (ref >60)

## 2021-03-13 LAB — MRSA PCR SCREENING: MRSA by PCR: NEGATIVE

## 2021-03-13 SURGERY — ENDARTERECTOMY, FEMORAL
Anesthesia: General | Site: Groin

## 2021-03-13 MED ORDER — ONDANSETRON HCL 4 MG/2ML IJ SOLN
INTRAMUSCULAR | Status: AC
  Filled 2021-03-13: qty 2

## 2021-03-13 MED ORDER — METOPROLOL SUCCINATE ER 25 MG PO TB24
50.0000 mg | ORAL_TABLET | Freq: Every morning | ORAL | Status: DC
  Administered 2021-03-14 – 2021-03-15 (×2): 50 mg via ORAL
  Filled 2021-03-13 (×2): qty 2

## 2021-03-13 MED ORDER — ONDANSETRON HCL 4 MG/2ML IJ SOLN: 4.0000 mg | Freq: Four times a day (QID) | INTRAMUSCULAR | Status: DC | PRN

## 2021-03-13 MED ORDER — ADULT MULTIVITAMIN W/MINERALS CH
1.0000 | ORAL_TABLET | Freq: Every day | ORAL | Status: DC
  Administered 2021-03-13 – 2021-03-15 (×3): 1 via ORAL
  Filled 2021-03-13 (×3): qty 1

## 2021-03-13 MED ORDER — "VISTASEAL 4 ML SINGLE DOSE KIT "
PACK | CUTANEOUS | Status: DC | PRN
  Administered 2021-03-13: 4 mL via TOPICAL

## 2021-03-13 MED ORDER — PHENOL 1.4 % MT LIQD
1.0000 | OROMUCOSAL | Status: DC | PRN
Start: 2021-03-13 — End: 2021-03-15
  Filled 2021-03-13: qty 177

## 2021-03-13 MED ORDER — LIDOCAINE HCL (PF) 2 % IJ SOLN
INTRAMUSCULAR | Status: AC
  Filled 2021-03-13: qty 4

## 2021-03-13 MED ORDER — DOPAMINE-DEXTROSE 3.2-5 MG/ML-% IV SOLN
3.0000 ug/kg/min | INTRAVENOUS | Status: DC
Start: 2021-03-13 — End: 2021-03-15

## 2021-03-13 MED ORDER — ROCURONIUM BROMIDE 10 MG/ML (PF) SYRINGE
PREFILLED_SYRINGE | INTRAVENOUS | Status: AC
  Filled 2021-03-13: qty 10

## 2021-03-13 MED ORDER — ACETAMINOPHEN 10 MG/ML IV SOLN
INTRAVENOUS | Status: AC
  Filled 2021-03-13: qty 100

## 2021-03-13 MED ORDER — SODIUM CHLORIDE 0.9 % IV SOLN
INTRAVENOUS | Status: DC | PRN
  Administered 2021-03-13: 501 mL via INTRAMUSCULAR

## 2021-03-13 MED ORDER — CHLORHEXIDINE GLUCONATE CLOTH 2 % EX PADS
6.0000 | MEDICATED_PAD | Freq: Every day | CUTANEOUS | Status: DC
  Administered 2021-03-14 – 2021-03-15 (×2): 6 via TOPICAL

## 2021-03-13 MED ORDER — OXYCODONE-ACETAMINOPHEN 5-325 MG PO TABS: 1.0000 | ORAL_TABLET | ORAL | Status: DC | PRN

## 2021-03-13 MED ORDER — NITROGLYCERIN IN D5W 200-5 MCG/ML-% IV SOLN
5.0000 ug/min | INTRAVENOUS | Status: DC
Start: 2021-03-13 — End: 2021-03-15

## 2021-03-13 MED ORDER — ACETAMINOPHEN 325 MG PO TABS
325.0000 mg | ORAL_TABLET | ORAL | Status: DC | PRN
  Filled 2021-03-13: qty 2

## 2021-03-13 MED ORDER — SENNOSIDES-DOCUSATE SODIUM 8.6-50 MG PO TABS
1.0000 | ORAL_TABLET | Freq: Every evening | ORAL | Status: DC | PRN
  Filled 2021-03-13: qty 1

## 2021-03-13 MED ORDER — ALUM & MAG HYDROXIDE-SIMETH 200-200-20 MG/5ML PO SUSP
15.0000 mL | ORAL | Status: DC | PRN
Start: 2021-03-13 — End: 2021-03-15

## 2021-03-13 MED ORDER — BUPIVACAINE HCL (PF) 0.5 % IJ SOLN
INTRAMUSCULAR | Status: DC | PRN
  Administered 2021-03-13: 30 mL

## 2021-03-13 MED ORDER — GUAIFENESIN-DM 100-10 MG/5ML PO SYRP
15.0000 mL | ORAL_SOLUTION | ORAL | Status: DC | PRN
  Filled 2021-03-13: qty 15

## 2021-03-13 MED ORDER — SODIUM CHLORIDE 0.9 % IV SOLN
INTRAVENOUS | Status: DC | PRN
  Administered 2021-03-13: 25 ug/min via INTRAVENOUS

## 2021-03-13 MED ORDER — METOPROLOL TARTRATE 5 MG/5ML IV SOLN: 2.0000 mg | INTRAVENOUS | Status: DC | PRN

## 2021-03-13 MED ORDER — SUCCINYLCHOLINE CHLORIDE 200 MG/10ML IV SOSY
PREFILLED_SYRINGE | INTRAVENOUS | Status: AC
  Filled 2021-03-13: qty 10

## 2021-03-13 MED ORDER — BUPIVACAINE HCL (PF) 0.5 % IJ SOLN
INTRAMUSCULAR | Status: AC
  Filled 2021-03-13: qty 30

## 2021-03-13 MED ORDER — PHENYLEPHRINE HCL (PRESSORS) 10 MG/ML IV SOLN
INTRAVENOUS | Status: DC | PRN
  Administered 2021-03-13 (×2): 100 ug via INTRAVENOUS

## 2021-03-13 MED ORDER — BUPIVACAINE LIPOSOME 1.3 % IJ SUSP
INTRAMUSCULAR | Status: AC
  Filled 2021-03-13: qty 20

## 2021-03-13 MED ORDER — HYDROCORTISONE (PERIANAL) 2.5 % EX CREA
1.0000 "application " | TOPICAL_CREAM | Freq: Every day | CUTANEOUS | Status: DC | PRN
  Filled 2021-03-13: qty 28.35

## 2021-03-13 MED ORDER — ROCURONIUM BROMIDE 100 MG/10ML IV SOLN
INTRAVENOUS | Status: DC | PRN
  Administered 2021-03-13: 20 mg via INTRAVENOUS
  Administered 2021-03-13: 5 mg via INTRAVENOUS
  Administered 2021-03-13: 30 mg via INTRAVENOUS
  Administered 2021-03-13: 20 mg via INTRAVENOUS

## 2021-03-13 MED ORDER — SUCCINYLCHOLINE CHLORIDE 20 MG/ML IJ SOLN
INTRAMUSCULAR | Status: DC | PRN
  Administered 2021-03-13: 100 mg via INTRAVENOUS

## 2021-03-13 MED ORDER — ACETAMINOPHEN 325 MG RE SUPP
325.0000 mg | RECTAL | Status: DC | PRN
  Filled 2021-03-13: qty 2

## 2021-03-13 MED ORDER — LIDOCAINE HCL (CARDIAC) PF 100 MG/5ML IV SOSY
PREFILLED_SYRINGE | INTRAVENOUS | Status: DC | PRN
  Administered 2021-03-13: 80 mg via INTRAVENOUS

## 2021-03-13 MED ORDER — ENOXAPARIN SODIUM 30 MG/0.3ML IJ SOSY: 30.0000 mg | PREFILLED_SYRINGE | INTRAMUSCULAR | Status: DC

## 2021-03-13 MED ORDER — CLOPIDOGREL BISULFATE 75 MG PO TABS
75.0000 mg | ORAL_TABLET | Freq: Every day | ORAL | Status: DC
  Administered 2021-03-13 – 2021-03-15 (×3): 75 mg via ORAL
  Filled 2021-03-13 (×3): qty 1

## 2021-03-13 MED ORDER — SORBITOL 70 % SOLN
30.0000 mL | Freq: Every day | Status: DC | PRN
  Filled 2021-03-13: qty 30

## 2021-03-13 MED ORDER — PANTOPRAZOLE SODIUM 40 MG PO TBEC
40.0000 mg | DELAYED_RELEASE_TABLET | Freq: Every day | ORAL | Status: DC | PRN
  Filled 2021-03-13: qty 1

## 2021-03-13 MED ORDER — ONDANSETRON HCL 4 MG/2ML IJ SOLN
INTRAMUSCULAR | Status: DC | PRN
  Administered 2021-03-13 (×2): 4 mg via INTRAVENOUS

## 2021-03-13 MED ORDER — ACETAMINOPHEN 10 MG/ML IV SOLN
INTRAVENOUS | Status: DC | PRN
  Administered 2021-03-13: 1000 mg via INTRAVENOUS

## 2021-03-13 MED ORDER — DOCUSATE SODIUM 100 MG PO CAPS
100.0000 mg | ORAL_CAPSULE | Freq: Every day | ORAL | Status: DC
  Administered 2021-03-14 – 2021-03-15 (×2): 100 mg via ORAL
  Filled 2021-03-13 (×2): qty 1

## 2021-03-13 MED ORDER — DEXAMETHASONE SODIUM PHOSPHATE 10 MG/ML IJ SOLN
INTRAMUSCULAR | Status: DC | PRN
  Administered 2021-03-13: 5 mg via INTRAVENOUS

## 2021-03-13 MED ORDER — VITAMIN D 25 MCG (1000 UNIT) PO TABS
2000.0000 [IU] | ORAL_TABLET | Freq: Every morning | ORAL | Status: DC
  Administered 2021-03-14 – 2021-03-15 (×2): 2000 [IU] via ORAL
  Filled 2021-03-13 (×2): qty 2

## 2021-03-13 MED ORDER — SODIUM CHLORIDE 0.9 % IV SOLN: INTRAVENOUS | Status: DC

## 2021-03-13 MED ORDER — FAMOTIDINE IN NACL 20-0.9 MG/50ML-% IV SOLN
20.0000 mg | Freq: Two times a day (BID) | INTRAVENOUS | Status: DC
  Administered 2021-03-13 – 2021-03-15 (×4): 20 mg via INTRAVENOUS
  Filled 2021-03-13 (×4): qty 50

## 2021-03-13 MED ORDER — CEFAZOLIN SODIUM-DEXTROSE 2-4 GM/100ML-% IV SOLN
INTRAVENOUS | Status: AC
  Administered 2021-03-13: 2 g via INTRAVENOUS
  Filled 2021-03-13: qty 100

## 2021-03-13 MED ORDER — CHLORHEXIDINE GLUCONATE 0.12 % MT SOLN
OROMUCOSAL | Status: AC
  Administered 2021-03-13: 15 mL via OROMUCOSAL
  Filled 2021-03-13: qty 15

## 2021-03-13 MED ORDER — FENTANYL CITRATE (PF) 100 MCG/2ML IJ SOLN
INTRAMUSCULAR | Status: AC
  Filled 2021-03-13: qty 2

## 2021-03-13 MED ORDER — SUGAMMADEX SODIUM 200 MG/2ML IV SOLN
INTRAVENOUS | Status: DC | PRN
  Administered 2021-03-13: 200 mg via INTRAVENOUS

## 2021-03-13 MED ORDER — HEPARIN SODIUM (PORCINE) 5000 UNIT/ML IJ SOLN
INTRAMUSCULAR | Status: AC
  Filled 2021-03-13: qty 1

## 2021-03-13 MED ORDER — MAGNESIUM SULFATE 2 GM/50ML IV SOLN
2.0000 g | Freq: Every day | INTRAVENOUS | Status: DC | PRN
  Filled 2021-03-13: qty 50

## 2021-03-13 MED ORDER — ENOXAPARIN SODIUM 40 MG/0.4ML IJ SOSY
40.0000 mg | PREFILLED_SYRINGE | INTRAMUSCULAR | Status: DC
  Administered 2021-03-14 – 2021-03-15 (×2): 40 mg via SUBCUTANEOUS
  Filled 2021-03-13 (×2): qty 0.4

## 2021-03-13 MED ORDER — HEPARIN SODIUM (PORCINE) 1000 UNIT/ML IJ SOLN
INTRAMUSCULAR | Status: DC | PRN
  Administered 2021-03-13: 5000 [IU] via INTRAVENOUS

## 2021-03-13 MED ORDER — MEPERIDINE HCL 25 MG/ML IJ SOLN: 6.2500 mg | INTRAMUSCULAR | Status: DC | PRN

## 2021-03-13 MED ORDER — ASPIRIN EC 81 MG PO TBEC
81.0000 mg | DELAYED_RELEASE_TABLET | Freq: Every morning | ORAL | Status: DC
  Administered 2021-03-13 – 2021-03-15 (×3): 81 mg via ORAL
  Filled 2021-03-13 (×3): qty 1

## 2021-03-13 MED ORDER — FENTANYL CITRATE (PF) 100 MCG/2ML IJ SOLN: 25.0000 ug | INTRAMUSCULAR | Status: DC | PRN

## 2021-03-13 MED ORDER — BUPIVACAINE LIPOSOME 1.3 % IJ SUSP
INTRAMUSCULAR | Status: DC | PRN
  Administered 2021-03-13: 20 mL

## 2021-03-13 MED ORDER — LABETALOL HCL 5 MG/ML IV SOLN
10.0000 mg | INTRAVENOUS | Status: DC | PRN
Start: 2021-03-13 — End: 2021-03-15

## 2021-03-13 MED ORDER — POTASSIUM CHLORIDE CRYS ER 20 MEQ PO TBCR: 20.0000 meq | EXTENDED_RELEASE_TABLET | Freq: Every day | ORAL | Status: DC | PRN

## 2021-03-13 MED ORDER — ROSUVASTATIN CALCIUM 10 MG PO TABS
10.0000 mg | ORAL_TABLET | Freq: Every evening | ORAL | Status: DC
  Administered 2021-03-13 – 2021-03-14 (×2): 10 mg via ORAL
  Filled 2021-03-13 (×2): qty 1

## 2021-03-13 MED ORDER — PROPOFOL 10 MG/ML IV BOLUS
INTRAVENOUS | Status: AC
  Filled 2021-03-13: qty 20

## 2021-03-13 MED ORDER — DEXMEDETOMIDINE HCL 200 MCG/2ML IV SOLN
INTRAVENOUS | Status: DC | PRN
  Administered 2021-03-13: 12 ug via INTRAVENOUS

## 2021-03-13 MED ORDER — DEXAMETHASONE SODIUM PHOSPHATE 10 MG/ML IJ SOLN
INTRAMUSCULAR | Status: AC
  Filled 2021-03-13: qty 1

## 2021-03-13 MED ORDER — FENTANYL CITRATE (PF) 100 MCG/2ML IJ SOLN
INTRAMUSCULAR | Status: DC | PRN
  Administered 2021-03-13 (×2): 25 ug via INTRAVENOUS
  Administered 2021-03-13: 50 ug via INTRAVENOUS

## 2021-03-13 MED ORDER — TAMSULOSIN HCL 0.4 MG PO CAPS
0.4000 mg | ORAL_CAPSULE | Freq: Every day | ORAL | Status: DC
  Administered 2021-03-14 – 2021-03-15 (×2): 0.4 mg via ORAL
  Filled 2021-03-13 (×2): qty 1

## 2021-03-13 MED ORDER — SODIUM CHLORIDE 0.9 % IV SOLN: 500.0000 mL | Freq: Once | INTRAVENOUS | Status: DC | PRN

## 2021-03-13 MED ORDER — MORPHINE SULFATE (PF) 2 MG/ML IV SOLN: 2.0000 mg | INTRAVENOUS | Status: DC | PRN

## 2021-03-13 MED ORDER — SALINE SPRAY 0.65 % NA SOLN
1.0000 | Freq: Every day | NASAL | Status: DC | PRN
  Filled 2021-03-13: qty 44

## 2021-03-13 MED ORDER — HYDROMORPHONE HCL 1 MG/ML IJ SOLN: 1.0000 mg | Freq: Once | INTRAMUSCULAR | Status: DC | PRN

## 2021-03-13 MED ORDER — CEFAZOLIN SODIUM-DEXTROSE 2-4 GM/100ML-% IV SOLN
2.0000 g | Freq: Three times a day (TID) | INTRAVENOUS | Status: AC
  Administered 2021-03-13: 2 g via INTRAVENOUS
  Filled 2021-03-13 (×2): qty 100

## 2021-03-13 MED ORDER — ONDANSETRON HCL 4 MG/2ML IJ SOLN: 4.0000 mg | Freq: Once | INTRAMUSCULAR | Status: DC | PRN

## 2021-03-13 MED ORDER — HYDRALAZINE HCL 20 MG/ML IJ SOLN
5.0000 mg | INTRAMUSCULAR | Status: DC | PRN
Start: 2021-03-13 — End: 2021-03-15

## 2021-03-13 SURGICAL SUPPLY — 76 items
ADH SKN CLS APL DERMABOND .7 (GAUZE/BANDAGES/DRESSINGS) ×2
APL PRP STRL LF DISP 70% ISPRP (MISCELLANEOUS) ×2
APPLIER CLIP 11 MED OPEN (CLIP)
APPLIER CLIP 9.375 SM OPEN (CLIP)
APR CLP MED 11 20 MLT OPN (CLIP)
APR CLP SM 9.3 20 MLT OPN (CLIP)
BAG DECANTER FOR FLEXI CONT (MISCELLANEOUS) ×3 IMPLANT
BLADE SURG 15 STRL LF DISP TIS (BLADE) ×2 IMPLANT
BLADE SURG 15 STRL SS (BLADE) ×3
BLADE SURG SZ11 CARB STEEL (BLADE) ×3 IMPLANT
BOOT SUTURE AID YELLOW STND (SUTURE) ×3 IMPLANT
BRUSH SCRUB EZ  4% CHG (MISCELLANEOUS) ×3
BRUSH SCRUB EZ 4% CHG (MISCELLANEOUS) ×2 IMPLANT
CANISTER SUCT 1200ML W/VALVE (MISCELLANEOUS) ×3 IMPLANT
CHLORAPREP W/TINT 26 (MISCELLANEOUS) ×3 IMPLANT
CLIP APPLIE 11 MED OPEN (CLIP) IMPLANT
CLIP APPLIE 9.375 SM OPEN (CLIP) IMPLANT
COVER WAND RF STERILE (DRAPES) ×3 IMPLANT
DECANTER SPIKE VIAL GLASS SM (MISCELLANEOUS) ×3 IMPLANT
DERMABOND ADVANCED (GAUZE/BANDAGES/DRESSINGS) ×1
DERMABOND ADVANCED .7 DNX12 (GAUZE/BANDAGES/DRESSINGS) ×2 IMPLANT
DRAPE INCISE IOBAN 66X45 STRL (DRAPES) ×3 IMPLANT
DRESSING SURGICEL FIBRLLR 1X2 (HEMOSTASIS) ×2 IMPLANT
DRSG OPSITE POSTOP 4X6 (GAUZE/BANDAGES/DRESSINGS) ×2 IMPLANT
DRSG OPSITE POSTOP 4X8 (GAUZE/BANDAGES/DRESSINGS) ×1 IMPLANT
DRSG SURGICEL FIBRILLAR 1X2 (HEMOSTASIS)
ELECT CAUTERY BLADE 6.4 (BLADE) ×3 IMPLANT
ELECT REM PT RETURN 9FT ADLT (ELECTROSURGICAL) ×3
ELECTRODE REM PT RTRN 9FT ADLT (ELECTROSURGICAL) ×2 IMPLANT
GLOVE SURG ENC MOIS LTX SZ7 (GLOVE) ×3 IMPLANT
GLOVE SURG SYN 7.0 (GLOVE) ×6 IMPLANT
GLOVE SURG SYN 7.0 PF PI (GLOVE) ×4 IMPLANT
GLOVE SURG SYN 8.0 (GLOVE) ×3 IMPLANT
GLOVE SURG SYN 8.0 PF PI (GLOVE) ×2 IMPLANT
GLOVE SURG UNDER LTX SZ7.5 (GLOVE) ×3 IMPLANT
GOWN STRL REUS W/ TWL LRG LVL3 (GOWN DISPOSABLE) ×4 IMPLANT
GOWN STRL REUS W/ TWL XL LVL3 (GOWN DISPOSABLE) ×6 IMPLANT
GOWN STRL REUS W/TWL LRG LVL3 (GOWN DISPOSABLE) ×6
GOWN STRL REUS W/TWL XL LVL3 (GOWN DISPOSABLE) ×9
IV NS 500ML (IV SOLUTION) ×3
IV NS 500ML BAXH (IV SOLUTION) ×2 IMPLANT
KIT TURNOVER KIT A (KITS) ×3 IMPLANT
LABEL OR SOLS (LABEL) ×3 IMPLANT
LOOP RED MAXI  1X406MM (MISCELLANEOUS) ×2
LOOP VESSEL MAXI  1X406 RED (MISCELLANEOUS) ×4
LOOP VESSEL MAXI 1X406 RED (MISCELLANEOUS) ×4 IMPLANT
LOOP VESSEL MINI 0.8X406 BLUE (MISCELLANEOUS) ×6 IMPLANT
LOOPS BLUE MINI 0.8X406MM (MISCELLANEOUS) ×3
MANIFOLD NEPTUNE II (INSTRUMENTS) ×3 IMPLANT
NDL HYPO 18GX1.5 BLUNT FILL (NEEDLE) ×2 IMPLANT
NEEDLE HYPO 18GX1.5 BLUNT FILL (NEEDLE) ×3 IMPLANT
NS IRRIG 500ML POUR BTL (IV SOLUTION) ×3 IMPLANT
PACK BASIN MAJOR ARMC (MISCELLANEOUS) ×3 IMPLANT
PACK UNIVERSAL (MISCELLANEOUS) ×3 IMPLANT
PATCH CAROTID ECM VASC 1X10 (Prosthesis & Implant Heart) ×1 IMPLANT
PENCIL ELECTRO HAND CTR (MISCELLANEOUS) IMPLANT
SET WALTER ACTIVATION W/DRAPE (SET/KITS/TRAYS/PACK) ×3 IMPLANT
SUT MNCRL+ 5-0 UNDYED PC-3 (SUTURE) ×2 IMPLANT
SUT MONOCRYL 5-0 (SUTURE) ×9
SUT PROLENE 5 0 RB 1 DA (SUTURE) ×17 IMPLANT
SUT PROLENE 6 0 BV (SUTURE) ×12 IMPLANT
SUT PROLENE 7 0 BV 1 (SUTURE) ×6 IMPLANT
SUT SILK 2 0 (SUTURE) ×3
SUT SILK 2-0 18XBRD TIE 12 (SUTURE) ×2 IMPLANT
SUT SILK 3 0 (SUTURE) ×3
SUT SILK 3-0 18XBRD TIE 12 (SUTURE) ×2 IMPLANT
SUT SILK 4 0 (SUTURE) ×3
SUT SILK 4-0 18XBRD TIE 12 (SUTURE) ×2 IMPLANT
SUT VIC AB 2-0 CT1 27 (SUTURE) ×6
SUT VIC AB 2-0 CT1 TAPERPNT 27 (SUTURE) ×4 IMPLANT
SUT VIC AB 3-0 SH 27 (SUTURE) ×3
SUT VIC AB 3-0 SH 27X BRD (SUTURE) ×2 IMPLANT
SUT VICRYL+ 3-0 36IN CT-1 (SUTURE) ×6 IMPLANT
SYR 20ML LL LF (SYRINGE) ×3 IMPLANT
SYR 5ML LL (SYRINGE) ×3 IMPLANT
TRAY FOLEY MTR SLVR 16FR STAT (SET/KITS/TRAYS/PACK) ×3 IMPLANT

## 2021-03-13 NOTE — Op Note (Signed)
OPERATIVE NOTE   PROCEDURE: 1. Redo left common femoral, superficial femoral and profunda femoris endarterectomy with Cormatrix patch angioplasty  PRE-OPERATIVE DIAGNOSIS: Atherosclerotic occlusive disease left lower extremity with lifestyle limiting claudication and rest pain symptoms; hypertension  POST-OPERATIVE DIAGNOSIS: Same  CO-SURGEON: Katha Cabal, MD and Algernon Huxley, M.D.  ASSISTANT(S): None  ANESTHESIA: general  ESTIMATED BLOOD LOSS: 150 cc  FINDING(S): 1. Profound calcific plaque noted of the left common femoral extending past the initial bifurcation of the profunda femoris arteries as well as down the extensive length of the SFA  SPECIMEN(S):  Calcific plaque from the common femoral, superficial femoral and the profunda femoris artery  INDICATIONS:   Brandon Lewis 87 y.o. y.o.male who presents with complaints of lifestyle limiting claudication and pain continuously in the left lower extremity. The patient has documented severe atherosclerotic occlusive disease and has undergone minimally invasive treatments in the past. However, at this point his primary area of stricture stenosis resides in the common femoral and origins of the superficial femoral and profunda femoris extending into these arteries and therefore this is not amenable to intervention. The patient is therefore undergoing open endarterectomy. The risks and benefits of surgery have been reviewed with the patient, all questions have answered; alternative therapies have been reviewed as well and the patient has agreed to proceed with surgical open repair.  DESCRIPTION: After obtaining full informed written consent, the patient was brought back to the operating room and placed supine upon the operating table.  The patient received IV antibiotics prior to induction.  After obtaining adequate anesthesia, the patient was prepped and draped in the standard fashion  for left femoral exposure.    Co-surgeons are required because this is a complicated procedure with work being performed simultaneously from both the patient's right left sides.  This also expedites the procedure making a shorter operative time reducing complications and improving patient safety.  Attention was turned to the left groin with Dr. Lucky Cowboy working on the patient's right and myself working on the left of the patient.  Vertical  Incision was made over the left common femoral artery and dissection carried down to the common femoral artery with electrocautery.  We dissected out the common femoral artery from the distal external iliac artery (identified by the superficial circumflex vessels) down to the femoral bifurcation.  On initial inspection, the left limb of the bypass graft and the common femoral artery was identified.  The anastomosis was densely calcified and there was no palpable pulse noted distal to the anastomosis.    Subsequently the dissection was continued down to the profunda femoral artery and superficial femoral artery. The superficial femoral artery was dissected circumferentially for a distance of approximately 3-4 cm and the profunda femoris was dissected circumferentially out to the fourth order branches individual vessel loops were placed around each branch.  Control of all branches was obtained with vessel loops.  A softer area in the distal external iliac artery amendable to clamping was identified in association with the limb of the bypass graft.    The patient was given 5000 units of Heparin intravenously, which was a therapeutic bolus.   After waiting 3 minutes, the distal external iliac artery was clamped and all of the vessel loops were placed under tension.  Arteriotomy was made in the common femoral artery with a 11-blade and extended it with a Potts scissor proximally and distally extending the distal end down the profunda femoris for approximately 3 cm.   Endarterectomy  was then performed under direct visualization using a freer elevator and a right angle from the mid common femoral extending up both proximally and distally including the distal portion of the left limb of the aortobifemoral bypass graft. Proximally the endarterectomy was brought up to the level of the clamp where a clean edge was obtained. Distally the endarterectomy was carried down to a soft spot in the profunda femoris where a feathered edge would was obtained.  7-0 Prolene interrupted tacking sutures were placed to secure the leading edge of the plaque in the profunda femoris.  The SFA was treated with an eversion technique extending endarterectomy approximately 2 cm distally again obtaining a featheredge.   At this point, a corematrix patch was fashioned for the geometry of the arteriotomy.  The patch was sewn to the artery with 2 running stitches of 5-0 Prolene, running from each end.  Prior to completing the patch angioplasty, the profunda femoral artery was flushed as was the superficial femoral artery. The system was then forward flushed. The endarterectomy site was then irrigated copiously with heparinized saline. The patch angioplasty was completed in the usual fashion.  Flow was then reestablished first to the profunda femoris and then the superficial femoral artery. Any gaps or bleeding sites in the suture line were easily controlled with a 5-0 Prolene suture.   The left groin was then irrigated copiously with sterile saline and 50 cc of Exparel with Marcaine was infiltrated in soft tissues.  Subsequently Evicel and Surgicel were placed in the wound. The incision was repaired with a double layer of 2-0 Vicryl, a double layer of 3-0 Vicryl, and a layer of 4-0 Monocryl in a subcuticular fashion.  The skin was cleaned, dried, and reinforced with Dermabond.  COMPLICATIONS: None  CONDITION: Brandon Lewis, M.D. [facility] Office: [phone]  03/13/2021, 11:01  AM

## 2021-03-13 NOTE — H&P (Signed)
@LOGO @   MRN : [id number]  Brandon Lewis is a 87 y.o. (06/27/1934) male who presents with chief complaint of No chief complaint on file. Marland Kitchen  History of Present Illness:   The patient returns to the office for followup and review status post angiogram without intervention. The patient notes no change in the lower extremity symptoms. He continues to have claudication and rest pain symptoms.  No new ulcers or wounds have occurred since the last visit.  There have been no significant changes to the patient's overall health care.  The patient denies amaurosis fugax or recent TIA symptoms. There are no recent neurological changes noted. The patient denies history of DVT, PE or superficial thrombophlebitis. The patient denies recent episodes of angina or shortness of breath.   Duplex US of the right lower extremity arterial system shows the right limb of the aortobifemoral bypass appears to have a widely patent femoral anastomosis.  This corroborates the angiographic findings.  The angiogram is reviewed with the patient and his son.  The images are pulled up and discussed.  Given the angiographic findings and the duplex ultrasound findings I do not recommend revision of the right femoral anastomosis however the left femoral anastomosis clearly has a 90% stenosis and patency of the left limb of the aortobifemoral graft is threatened.  The patient does have bilateral SFA occlusions with two-vessel runoff to the foot bilaterally there is moderate disease on the right and the tibioperoneal trunk on the left TP trunk is widely patent.  Current Meds  Medication Sig  . aspirin EC 81 MG tablet Take 81 mg by mouth in the morning. Swallow whole.  . Cholecalciferol (VITAMIN D) 50 MCG (2000 UT) CAPS Take 2,000 Units by mouth in the morning.  . clopidogrel (PLAVIX) 75 MG tablet Take 75 mg by mouth daily.  . hydrocortisone (ANUSOL-HC) 2.5 % rectal cream Place 1 application rectally daily as needed for  hemorrhoids or anal itching.  Marland Kitchen MAGNESIUM GLYCINATE PO Take 500 mg by mouth every evening.  . metoprolol succinate (TOPROL-XL) 50 MG 24 hr tablet Take 1 tablet (50 mg total) by mouth in the morning.  . Multiple Vitamin (MULTIVITAMIN WITH MINERALS) TABS tablet Take 1 tablet by mouth daily. Centrum Silver  . pantoprazole (PROTONIX) 40 MG tablet Take 40 mg by mouth daily as needed (indigestion/heartburn).  . rosuvastatin (CRESTOR) 10 MG tablet Take 1 tablet (10 mg total) by mouth every evening.  . sodium chloride (OCEAN) 0.65 % nasal spray Place 1 spray into the nose daily as needed (sinus congestion).  . tamsulosin (FLOMAX) 0.4 MG CAPS capsule TAKE 1 CAPSULE BY MOUTH ONCE DAILY (Patient taking differently: Take 0.4 mg by mouth daily after breakfast.)  . [DISCONTINUED] metoprolol succinate (TOPROL-XL) 50 MG 24 hr tablet **VIAL ONLY** TAKE (1) TABLET BY MOUTH ONCE A DAY.DO NOT CRUSH. (HIGHBLOOD PRESSURE) *BOTTLE* (Patient taking differently: Take 50 mg by mouth in the morning.)  . [DISCONTINUED] rosuvastatin (CRESTOR) 10 MG tablet **VIAL ONLY** TAKE ONE TABLET BY MOUTH AT BEDTIME. (IMPROVES CHOLESTEROL) (Patient taking differently: Take 10 mg by mouth every evening.)    Past Medical History:  Diagnosis Date  . Anemia    h/o  . Aortic atherosclerosis ([place])   . Bladder cancer ([place])    still receiving treatments  . CAD (coronary artery disease)    s/p PCI after presenting with exertional angina (drug eluting stent)  . Chronic kidney disease    STAGE 3B  . Diverticulosis   . GERD (gastroesophageal  reflux disease)   . History of kidney stones    h/o  . HTN (hypertension)   . 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 Hyperlipidemia   . Myocardial infarction ([place]) 09/2019   s/p CABG x 4  . Osteoarthritis   . PVD (peripheral vascular disease) ([place])    s/p aortobifemoral bypass, with bilateral SFA occlusion and intermittent claudication   . Small bowel obstruction  ([place])    after surgeries  . Thoracic ascending aortic aneurysm ([place])   . Thyroid nodule     Past Surgical History:  Procedure Laterality Date  . aorto-bifem  2003   [facility]   . CAROTID STENT     [facility]  . CATARACT EXTRACTION, BILATERAL  2017  . CHOLECYSTECTOMY  2002  . COLONOSCOPY    . CORONARY ARTERY BYPASS GRAFT N/A 09/16/2019   Procedure: CORONARY ARTERY BYPASS GRAFTING (CABG) using LIMA to LAD; Endoscopic right saphenous vein harvest to OM1, Diag1, and RCA.;  Surgeon: Wonda Olds, MD;  Location: [facility];  Service: Open Heart Surgery;  Laterality: N/A;  . LEFT HEART CATH AND CORONARY ANGIOGRAPHY N/A 09/12/2019   Procedure: LEFT HEART CATH AND CORONARY ANGIOGRAPHY;  Surgeon: Corey Skains, MD;  Location: [facility];  Service: Cardiovascular;  Laterality: N/A;  . LITHOTRIPSY    . LOWER EXTREMITY ANGIOGRAPHY Left 01/15/2021   Procedure: LOWER EXTREMITY ANGIOGRAPHY;  Surgeon: Katha Cabal, MD;  Location: [facility];  Service: Cardiovascular;  Laterality: Left;  . TEE WITHOUT CARDIOVERSION N/A 09/16/2019   Procedure: TRANSESOPHAGEAL ECHOCARDIOGRAM (TEE);  Surgeon: Wonda Olds, MD;  Location: [facility];  Service: Open Heart Surgery;  Laterality: N/A;  . TONSILLECTOMY    . TRANSURETHRAL RESECTION OF BLADDER TUMOR WITH MITOMYCIN-C N/A 07/24/2020   Procedure: TRANSURETHRAL RESECTION OF BLADDER TUMOR WITH Gemcitabine;  Surgeon: Abbie Sons, MD;  Location: [facility];  Service: Urology;  Laterality: N/A;  . WISDOM TOOTH EXTRACTION      Social History Social History   Tobacco Use  . Smoking status: Former Smoker    Packs/day: 1.00    Years: 20.00    Pack years: 20.00    Types: Cigarettes    Quit date: 10/20/1992    Years since quitting: 28.4  . Smokeless tobacco: Never Used  . Tobacco comment: quit in 1994   Vaping Use  . Vaping Use: Never used  Substance Use Topics  . Alcohol use: Yes    Comment: rare  . Drug use: No    Family  History Family History  Problem Relation Age of Onset  . Diabetes Father        DM - and brother   . Stroke Father   . Coronary artery disease Paternal Grandfather   . Colon cancer Neg Hx   . Prostate cancer Neg Hx     Allergies  Allergen Reactions  . Cilostazol Other (See Comments)    Bowel upset  . Simvastatin Other (See Comments)    myalgia     REVIEW OF SYSTEMS (Negative unless checked)  Constitutional: [] Weight loss  [] Fever  [] Chills Cardiac: [] Chest pain   [] Chest pressure   [] Palpitations   [] Shortness of breath when laying flat   [] Shortness of breath with exertion. Vascular:  [x] Pain in legs with walking   [] Pain in legs at rest  [] History of DVT   [] Phlebitis   [] Swelling in legs   [] Varicose veins   [] Non-healing ulcers Pulmonary:   []   Uses home oxygen   [] Productive cough   [] Hemoptysis   [] Wheeze  [] COPD   [] Asthma Neurologic:  [] Dizziness   [] Seizures   [] History of stroke   [] History of TIA  [] Aphasia   [] Vissual changes   [] Weakness or numbness in arm   [] Weakness or numbness in leg Musculoskeletal:   [] Joint swelling   [] Joint pain   [] Low back pain Hematologic:  [] Easy bruising  [] Easy bleeding   [] Hypercoagulable state   [] Anemic Gastrointestinal:  [] Diarrhea   [] Vomiting  [] Gastroesophageal reflux/heartburn   [] Difficulty swallowing. Genitourinary:  [] Chronic kidney disease   [] Difficult urination  [] Frequent urination   [] Blood in urine Skin:  [] Rashes   [] Ulcers  Psychological:  [] History of anxiety   []  History of major depression.  Physical Examination  Vitals:   03/13/21 0631  BP: 132/70  Pulse: 84  Resp: 20  Temp: (!) 97.4 F (36.3 C)  TempSrc: Temporal  SpO2: 98%  Weight: 90.7 kg  Height: 5\' 10"  (1.778 m)   Body mass index is 28.7 kg/m. Gen: WD/WN, NAD Head: [place]/AT, No temporalis wasting.  Ear/Nose/Throat: Hearing grossly intact, nares w/o erythema or drainage Eyes: PER, EOMI, sclera nonicteric.  Neck: Supple, no large masses.    Pulmonary:  Good air movement, no audible wheezing bilaterally, no use of accessory muscles.  Cardiac: RRR, no JVD Vascular:  Vessel Right Left  Radial Palpable Palpable  PT Not Palpable Not Palpable  DP Not Palpable Not Palpable  Gastrointestinal: Non-distended. No guarding/no peritoneal signs.  Musculoskeletal: M/S 5/5 throughout.  No deformity or atrophy.  Neurologic: CN 2-12 intact. Symmetrical.  Speech is fluent. Motor exam as listed above. Psychiatric: Judgment intact, Mood & affect appropriate for pt's clinical situation. Dermatologic: No rashes or ulcers noted.  No changes consistent with cellulitis. Lymph : No lichenification or skin changes of chronic lymphedema.  CBC Lab Results  Component Value Date   WBC 7.2 03/05/2021   HGB 10.6 (L) 03/05/2021   HCT 32.6 (L) 03/05/2021   MCV 119.0 (H) 03/05/2021   PLT 124 (L) 03/05/2021    BMET    Component Value Date/Time   NA 136 03/05/2021 1353   K 4.5 03/05/2021 1353   CL 106 03/05/2021 1353   CO2 24 03/05/2021 1353   GLUCOSE 122 (H) 03/05/2021 1353   BUN 26 (H) 03/05/2021 1353   CREATININE 1.43 (H) 03/05/2021 1353   CALCIUM 9.4 03/05/2021 1353   GFRNONAA 48 (L) 03/05/2021 1353   GFRAA 45 (L) 10/01/2019 0327   Estimated Creatinine Clearance: 43.6 mL/min (A) (by C-G formula based on SCr of 1.43 mg/dL (H)).  COAG Lab Results  Component Value Date   INR 1.1 03/05/2021   INR 1.3 (H) 09/16/2019   INR 1.1 09/14/2019    Radiology No results found.  .  Assessment/Plan 1. PAD (peripheral artery disease) (HCC)  Recommend:  The patient has evidence of severe atherosclerotic changes of both lower extremities associated with pain of the foot.  This represents a the potential for left aortobifemoral limb loss and places the patient at the risk for limb loss.  Angiography has been performed and the situation is not ideal for intervention.  Given this finding open surgical repair is recommended.   Patient should  undergo revision of the left femoral anastomosis of the left lower extremity with the hope for limb salvage.  The risks and benefits as well as the alternative therapies was discussed in detail with the patient.  All questions were answered.  Patient  agrees to proceed with revision surgery.  I will obtain cardiac clearance from Dr. [name] and move forward with surgery after that.  The patient will follow up with me in the office after the procedure.   2. Coronary artery disease due to lipid rich plaque Continue cardiac and antihypertensive medications as already ordered and reviewed, no changes at this time.  Continue statin as ordered and reviewed, no changes at this time  Nitrates PRN for chest pain   3. Essential hypertension, benign Continue antihypertensive medications as already ordered, these medications have been reviewed and there are no changes at this time.   4. Atrial flutter, unspecified type ([place]) Continue antiarrhythmia medications as already ordered, these medications have been reviewed and there are no changes at this time.  Continue anticoagulation as ordered by Cardiology Service   5. Hyperlipidemia, unspecified hyperlipidemia type Continue statin as ordered and reviewed, no changes at this time    Hortencia Pilar, MD  03/13/2021 7:28 AM

## 2021-03-13 NOTE — Anesthesia Postprocedure Evaluation (Signed)
Anesthesia Post Note  Patient: Brandon Lewis  Procedure(s) Performed: ENDARTERECTOMY FEMORAL (GROIN REVISION) (Left Groin) APPLICATION OF CELL SAVER (N/A Groin)  Patient location during evaluation: PACU Anesthesia Type: General Level of consciousness: awake and alert, awake and oriented Pain management: pain level controlled Vital Signs Assessment: post-procedure vital signs reviewed and stable Respiratory status: spontaneous breathing, nonlabored ventilation and respiratory function stable Cardiovascular status: blood pressure returned to baseline and stable Postop Assessment: no apparent nausea or vomiting Anesthetic complications: no   No complications documented.   Last Vitals:  Vitals:   03/13/21 1115 03/13/21 1130  BP: (!) 100/53 99/61  Pulse: 65 68  Resp: 16 18  Temp:    SpO2: 100% 96%    Last Pain:  Vitals:   03/13/21 1130  TempSrc:   PainSc: 0-No pain                 Phill Mutter

## 2021-03-13 NOTE — Interval H&P Note (Signed)
History and Physical Interval Note:  03/13/2021 7:30 AM  Brandon Lewis  has presented today for surgery, with the diagnosis of PAD.  The various methods of treatment have been discussed with the patient and family. After consideration of risks, benefits and other options for treatment, the patient has consented to  Procedure(s): ENDARTERECTOMY FEMORAL (GROIN REVISION) (Left) APPLICATION OF CELL SAVER (N/A) as a surgical intervention.  The patient's history has been reviewed, patient examined, no change in status, stable for surgery.  I have reviewed the patient's chart and labs.  Questions were answered to the patient's satisfaction.     Hortencia Pilar

## 2021-03-13 NOTE — Progress Notes (Signed)
Visited with patient and family bedside. Patient requested prayer, he prayed for me and I for him. Patient expressed the prayer for good results so he could care for his family.

## 2021-03-13 NOTE — Op Note (Signed)
OPERATIVE NOTE   PROCEDURE: 1. Left common femoral and profunda femoris artery endarterectomies and patch angioplasty 2.   Redo left femoral exposure after previous surgery    PRE-OPERATIVE DIAGNOSIS: 1.Atherosclerotic occlusive disease left lower extremities with rest pain   POST-OPERATIVE DIAGNOSIS: Same  SURGEON: Leotis Pain, MD  CO-surgeon:Gregory Schnier, MD  ANESTHESIA: general  ESTIMATED BLOOD LOSS: 100 cc  FINDING(S): 1. significant plaque in left common femoral, profunda femoris, and superficial femoral arteries  SPECIMEN(S): Left common femoral, profunda femoris, and superficial femoral artery plaque.  INDICATIONS:  Patient presents with rest pain of the left leg.  He has had a previous aortobifemoral bypass another institution.  He has a significant stenosis at the left femoral anastomosis into the common femoral artery with plaque tracking into the proximal profunda femoris artery.  He has a chronic left SFA occlusion.  Left femoral endarterectomy is planned to try to improve perfusion. The risks and benefits as well as alternative therapies including intervention were reviewed in detail all questions were answered the patient agrees to proceed with surgery.  DESCRIPTION: After obtaining full informed written consent, the patient was brought back to the operating room and placed supine upon the operating table. The patient received IV antibiotics prior to induction. After obtaining adequate anesthesia, the patient was prepped and draped in the standard fashion appropriate time out is called.   Vertical incision was created overlying the left femoral arteries. The common femoral artery proximally, the left limb of the aortobifemoral bypass, and superficial femoral artery, and primary profunda femoris artery branches were encircled with vessel loops and prepared for control.  The dissection was carried down into secondary and then tertiary profunda femoris  branches to get well beyond the plaque for control.  Dissection was quite tedious due to the thick scar tissue from his previous left femoral exposure for the aortobifemoral bypass.  The left femoral arteries were found to have significant plaque from the common femoral artery into the profunda and superficial femoral arteries.   5000 units of heparin was given and allowed circulate for 5 minutes.   Attention is then turned to the left femoral artery. An arteriotomy is made with 11 blade and extended with Potts scissors in the distal portion of the aortobifemoral bypass limb, the distal common femoral artery and carried down onto the first 3-4 cm of the profunda femoris artery artery. An endarterectomy was then performed. The [facility] was used to create a plane. The proximal endpoint was cut flush with tenotomy scissors. This was in the proximal common femoral artery.  Significant hyperplastic plaque was also removed from the bypass anastomosis and the most distal portion of the aortobifemoral limb.  An eversion endarterectomy was then performed for the first 2-3 cm of the superficial femoral artery. The distal endpoint of the superficial femoral artery endarterectomy was created with gentle traction and the distal endpoint was then tacked down with 7-0 Prolene sutures.  A total of four 5-0 Prolene sutures were used to reinforce the suture line between the common femoral artery and the previous aortobifemoral bypass graft in an interrupted mattress fashion. The Cormatrix patcth is then selected and prepared for a patch angioplasty.  It is cut and beveled and started at the proximal endpoint with a 5-0 Prolene suture.  This was in the distal portion of the aortobifemoral bypass limb and carried down into the common femoral artery.  Approximately one half of the suture line is run medially and laterally and the distal end point  was cut and bevelled to match the arteriotomy in the mid profunda femoris  artery several centimeters beyond its origin.  A second 5-0 Prolene was started at the distal end point and run to the mid portion to complete the arteriotomy.  The vessel was flushed prior to release of control and completion of the anastomosis.  At this point, flow was established first to the profunda femoris artery and then to the superficial femoral artery. Easily palpable pulses are noted well beyond the anastomosis.  Surgicel and Evicel topical hemostatic agents were placed in the femoral incision and hemostasis was complete. The femoral incision was then closed in a layered fashion with 2 layers of 2-0 Vicryl, 2 layers of 3-0 Vicryl, and 4-0 Monocryl for the skin closure. Dermabond and sterile dressing were then placed over the incision.  The patient was then awakened from anesthesia and taken to the recovery room in stable condition having tolerated the procedure well.  COMPLICATIONS: None  CONDITION: Stable     Leotis Pain 03/13/2021 10:43 AM   This note was created with Dragon Medical transcription system. Any errors in dictation are purely unintentional.

## 2021-03-13 NOTE — Anesthesia Procedure Notes (Signed)
Procedure Name: Intubation Performed by: Fredderick Phenix, CRNA Pre-anesthesia Checklist: Patient identified, Emergency Drugs available, Suction available and Patient being monitored Patient Re-evaluated:Patient Re-evaluated prior to induction Oxygen Delivery Method: Circle system utilized Preoxygenation: Pre-oxygenation with 100% oxygen Induction Type: IV induction Ventilation: Mask ventilation without difficulty Laryngoscope Size: Mac and 4 Grade View: Grade II Tube type: Oral Tube size: 7.5 mm Number of attempts: 1 Airway Equipment and Method: Stylet and Oral airway Placement Confirmation: ETT inserted through vocal cords under direct vision,  positive ETCO2 and breath sounds checked- equal and bilateral Secured at: 21 cm Tube secured with: Tape Dental Injury: Teeth and Oropharynx as per pre-operative assessment

## 2021-03-13 NOTE — Anesthesia Preprocedure Evaluation (Addendum)
Anesthesia Evaluation  Patient identified by MRN, date of birth, ID band Patient awake    Reviewed: Allergy & Precautions, NPO status , Patient's Chart, lab work & pertinent test results  Airway Mallampati: III  TM Distance: >3 FB Neck ROM: Full    Dental  (+) Dental Advisory Given, Poor Dentition   Pulmonary former smoker,    breath sounds clear to auscultation       Cardiovascular hypertension, Pt. on home beta blockers + CAD, + Past MI, + CABG and + Peripheral Vascular Disease   Rhythm:Regular Rate:Normal     Neuro/Psych  Neuromuscular disease    GI/Hepatic Neg liver ROS, GERD  ,  Endo/Other  negative endocrine ROS  Renal/GU Renal Insufficiency and CRFRenal disease     Musculoskeletal  (+) Arthritis ,   Abdominal Normal abdominal exam  (+) - obese,   Peds  Hematology negative hematology ROS (+) anemia ,   Anesthesia Other Findings   Reproductive/Obstetrics                            Echo:  1. Left ventricular ejection fraction, by visual estimation, is 50 to 55%. The left ventricle has normal function. There is no left ventricular hypertrophy.  2. Global right ventricle has normal systolic function.The right ventricular size is normal. No increase in right ventricular wall thickness.  3. Left atrial size was normal.  4. Right atrial size was normal.  5. The mitral valve is normal in structure. Mild mitral valve regurgitation.  6. The tricuspid valve is normal in structure. Tricuspid valve regurgitation is mild.  7. The aortic valve is normal in structure. Aortic valve regurgitation is not visualized.  8. The pulmonic valve was grossly normal. Pulmonic valve regurgitation is trivial.  9. Mildly elevated pulmonary artery systolic pressure.   Anesthesia Physical  Anesthesia Plan  ASA: IV  Anesthesia Plan: General   Post-op Pain Management:    Induction: Intravenous  PONV Risk  Score and Plan: 2 and Ondansetron and Treatment may vary due to age or medical condition  Airway Management Planned: Oral ETT  Additional Equipment:   Intra-op Plan:   Post-operative Plan: Extubation in OR  Informed Consent: I have reviewed the patients History and Physical, chart, labs and discussed the procedure including the risks, benefits and alternatives for the proposed anesthesia with the patient or authorized representative who has indicated his/her understanding and acceptance.     Dental advisory given  Plan Discussed with: CRNA, Anesthesiologist and Surgeon  Anesthesia Plan Comments:        Anesthesia Quick Evaluation

## 2021-03-13 NOTE — Transfer of Care (Signed)
Immediate Anesthesia Transfer of Care Note  Patient: Brandon Lewis  Procedure(s) Performed: ENDARTERECTOMY FEMORAL (GROIN REVISION) (Left Groin) APPLICATION OF CELL SAVER (N/A Groin)  Patient Location: PACU  Anesthesia Type:General  Level of Consciousness: awake and alert   Airway & Oxygen Therapy: Patient Spontanous Breathing and Patient connected to nasal cannula oxygen  Post-op Assessment: Report given to RN and Post -op Vital signs reviewed and stable  Post vital signs: Reviewed  Last Vitals:  Vitals Value Taken Time  BP 98/58 03/13/21 1101  Temp    Pulse 66 03/13/21 1106  Resp 20 03/13/21 1106  SpO2 100 % 03/13/21 1106  Vitals shown include unvalidated device data.  Last Pain:  Vitals:   03/13/21 0631  TempSrc: Temporal  PainSc: 0-No pain      Patients Stated Pain Goal: 0 (82/57/[id number])  Complications: No complications documented.

## 2021-03-14 ENCOUNTER — Encounter: Payer: Self-pay | Admitting: Vascular Surgery

## 2021-03-14 LAB — BASIC METABOLIC PANEL
Anion gap: 5 (ref 5–15)
BUN: 18 mg/dL (ref 8–23)
CO2: 23 mmol/L (ref 22–32)
Calcium: 8.8 mg/dL — ABNORMAL LOW (ref 8.9–10.3)
Chloride: 112 mmol/L — ABNORMAL HIGH (ref 98–111)
Creatinine, Ser: 1.36 mg/dL — ABNORMAL HIGH (ref 0.61–1.24)
GFR, Estimated: 51 mL/min — ABNORMAL LOW (ref >60)
Glucose, Bld: 127 mg/dL — ABNORMAL HIGH (ref 70–99)
Potassium: 4.2 mmol/L (ref 3.5–5.1)
Sodium: 140 mmol/L (ref 135–145)

## 2021-03-14 LAB — SURGICAL PATHOLOGY

## 2021-03-14 LAB — CBC
HCT: 26 % — ABNORMAL LOW (ref 39.0–52.0)
Hemoglobin: 8.4 g/dL — ABNORMAL LOW (ref 13.0–17.0)
MCH: 39.8 pg — ABNORMAL HIGH (ref 26.0–34.0)
MCHC: 32.3 g/dL (ref 30.0–36.0)
MCV: 123.2 fL — ABNORMAL HIGH (ref 80.0–100.0)
Platelets: 104 10*3/uL — ABNORMAL LOW (ref 150–400)
RBC: 2.11 MIL/uL — ABNORMAL LOW (ref 4.22–5.81)
RDW: 17 % — ABNORMAL HIGH (ref 11.5–15.5)
WBC: 10.1 10*3/uL (ref 4.0–10.5)
nRBC: 0 % (ref 0.0–0.2)

## 2021-03-14 MED ORDER — VECURONIUM BROMIDE 10 MG IV SOLR
INTRAVENOUS | Status: AC
  Filled 2021-03-14: qty 10

## 2021-03-14 MED ORDER — LORAZEPAM 2 MG/ML IJ SOLN
INTRAMUSCULAR | Status: AC
  Filled 2021-03-14: qty 2

## 2021-03-14 NOTE — Evaluation (Signed)
Occupational Therapy Evaluation Patient Details Name: Brandon Lewis MRN: [id number] DOB: Aug 26, 1934 Today's Date: 03/14/2021    History of Present Illness Pt is an 87 yo male s/p L endaterectomy. PMH of bladder cancer, CKD, GERD, HTN, MI, PVD, thoracic ascending aortic aneurysm.   Clinical Impression   Patient presenting with decreased I with self care,balance, functional mobility/transfers, endurance, and safety awareness.  Patient reports living at home with wife independently without use of AD PTA. Pt's wife with CVA deficits and she has a full time caregiver so he does not have to do much for her.  Patient currently functioning at supervision - min guard with use of RW this session. His home is set up to be handicap accessible. He does not need further equipment for self care needs. Patient will benefit from acute OT to increase overall independence in the areas of ADLs, functional mobility, safety awarness in order to safely discharge home.    Follow Up Recommendations  No OT follow up    Equipment Recommendations  None recommended by OT       Precautions / Restrictions Precautions Precautions: Fall Restrictions Weight Bearing Restrictions: No      Mobility Bed Mobility Overal bed mobility: Needs Assistance Bed Mobility: Supine to Sit     Supine to sit: Supervision          Transfers Overall transfer level: Needs assistance Equipment used: Rolling walker (2 wheeled) Transfers: Sit to/from Stand Sit to Stand: Min guard         General transfer comment: cued for hand placement    Balance Overall balance assessment: Needs assistance Sitting-balance support: Feet supported Sitting balance-Leahy Scale: Good       Standing balance-Leahy Scale: Fair Standing balance comment: pt preferred bilateral UE support with ambulation                           ADL either performed or assessed with clinical judgement   ADL Overall ADL's : Needs  assistance/impaired Eating/Feeding: Independent                   Lower Body Dressing: Min guard;Sit to/from stand Lower Body Dressing Details (indicate cue type and reason): Can don/doff socks while seated with supervisoin. Min guard for balance with dynamic standing. Toilet Transfer: Designer, television/film set Details (indicate cue type and reason): simulated         Functional mobility during ADLs: Min guard;Rolling walker       Vision Patient Visual Report: No change from baseline              Pertinent Vitals/Pain Pain Assessment: 0-10 Pain Score: 5  Pain Location: L groin Pain Descriptors / Indicators: Sore Pain Intervention(s): Limited activity within patient's tolerance;Monitored during session;Repositioned     Hand Dominance Right   Extremity/Trunk Assessment Upper Extremity Assessment Upper Extremity Assessment: Overall WFL for tasks assessed   Lower Extremity Assessment Lower Extremity Assessment: Overall WFL for tasks assessed   Cervical / Trunk Assessment Cervical / Trunk Assessment: Normal   Communication Communication Communication: HOH   Cognition Arousal/Alertness: Awake/alert Behavior During Therapy: WFL for tasks assessed/performed Overall Cognitive Status: Within Functional Limits for tasks assessed  Home Living Family/patient expects to be discharged to:: Private residence Living Arrangements: Spouse/significant other Available Help at Discharge: Family;Available PRN/intermittently Type of Home: House Home Access: Ramped entrance     Home Layout: Two level;Able to live on main level with bedroom/bathroom     Bathroom Shower/Tub: Tub/shower unit   Bathroom Toilet: Handicapped height Bathroom Accessibility: Yes How Accessible: Accessible via walker Home Equipment: [place] - 2 wheels;Shower seat          Prior Functioning/Environment Level of  Independence: Independent        Comments: Pt has a wife that he states has a caregiver during the day. He does not normally assist with ADLs due to the caregiver being available.        OT Problem List: Decreased strength;Impaired balance (sitting and/or standing);Decreased activity tolerance;Decreased safety awareness;Decreased knowledge of use of DME or AE      OT Treatment/Interventions: Self-care/ADL training;Manual therapy;Therapeutic exercise;Patient/family education;Modalities;Balance training;Energy conservation;Therapeutic activities;DME and/or AE instruction    OT Goals(Current goals can be found in the care plan section) Acute Rehab OT Goals Patient Stated Goal: to have decreased leg pain and go home OT Goal Formulation: With patient Time For Goal Achievement: 03/28/21 Potential to Achieve Goals: Good ADL Goals Pt Will Perform Grooming: with modified independence;standing Pt Will Perform Lower Body Dressing: with modified independence;sit to/from stand Pt Will Transfer to Toilet: with modified independence;ambulating Pt Will Perform Toileting - Clothing Manipulation and hygiene: with modified independence;sit to/from stand  OT Frequency: Min 2X/week   Barriers to D/C:    none known at this time          AM-PAC OT "6 Clicks" Daily Activity     Outcome Measure Help from another person eating meals?: None Help from another person taking care of personal grooming?: None Help from another person toileting, which includes using toliet, bedpan, or urinal?: A Little Help from another person bathing (including washing, rinsing, drying)?: A Little Help from another person to put on and taking off regular upper body clothing?: None Help from another person to put on and taking off regular lower body clothing?: A Little 6 Click Score: 21   End of Session Equipment Utilized During Treatment: Rolling walker Nurse Communication: Mobility status  Activity Tolerance: Patient  tolerated treatment well Patient left: in chair;with call bell/phone within reach;Other (comment) (with PT)  OT Visit Diagnosis: Unsteadiness on feet (R26.81);Muscle weakness (generalized) (M62.81)                Time: 2025-4270 OT Time Calculation (min): 13 min Charges:  OT General Charges $OT Visit: 1 Visit OT Evaluation $OT Eval Low Complexity: 1 Low  Darleen Crocker, MS, OTR/L , CBIS ascom [phone]  03/14/21, 11:37 AM

## 2021-03-14 NOTE — Evaluation (Signed)
Physical Therapy Evaluation Patient Details Name: Brandon Lewis MRN: [id number] DOB: [birth year] Today's Date: 03/14/2021   History of Present Illness  Pt is an 87 yo male s/p L endaterectomy. PMH of bladder cancer, CKD, GERD, HTN, MI, PVD, thoracic ascending aortic aneurysm.    Clinical Impression  Patient alert, in bed. HOH, able to don hearing aides. Reported 5/10 at end of session. At baseline the patient reported he is independent, lives with his wife who has an aide to assist her for ADLs.   The patient demonstrated bed mobility with supervision. Sit <> Stand from EOB and from recliner, cued for hand placement, CGA with RW. He was able to ambulate at least 349ft with RW and CGA, pt eager to determine is his claudication pain had improved, stated it did seem better. Pt did demonstrate the potential to not need RW, but indicated he felt more comfortable with bilateral UE support currently, has a RW at home. Pt up in chair at end of session, RN at bedside. Overall the patient demonstrated mild deficits in PLOF and would benefit from further skilled PT intervention to maximize safety, independence and function.     Follow Up Recommendations Home health PT    Equipment Recommendations  None recommended by PT    Recommendations for Other Services       Precautions / Restrictions Precautions Precautions: Fall Restrictions Weight Bearing Restrictions: No      Mobility  Bed Mobility Overal bed mobility: Needs Assistance Bed Mobility: Supine to Sit     Supine to sit: Supervision          Transfers Overall transfer level: Needs assistance Equipment used: Rolling walker (2 wheeled) Transfers: Sit to/from Stand Sit to Stand: Min guard         General transfer comment: cued for hand placement  Ambulation/Gait Ambulation/Gait assistance: Min guard;Supervision Gait Distance (Feet): 300 Feet Assistive device: Rolling walker (2 wheeled)     Gait velocity  interpretation: 1.31 - 2.62 ft/sec, indicative of limited community ambulator General Gait Details: Pt started ambulation with RW, did demonstrate signs of potentially not needing it. Pt requested to continue to use at this time, has access to one at home, and has used it in the past.  Financial trader [employer] (Stroke Patients Only)       Balance Overall balance assessment: Needs assistance Sitting-balance support: Feet supported Sitting balance-Leahy Scale: Good       Standing balance-Leahy Scale: Fair Standing balance comment: pt preferred bilateral UE support with ambulation                             Pertinent Vitals/Pain Pain Assessment: 0-10 Pain Score: 5  Pain Location: L groin Pain Descriptors / Indicators: Sore Pain Intervention(s): Limited activity within patient's tolerance;Monitored during session;Repositioned    Home Living Family/patient expects to be discharged to:: Private residence Living Arrangements: Spouse/significant other Available Help at Discharge: Family;Available PRN/intermittently Type of Home: House Home Access: Ramped entrance     Home Layout: Two level;Able to live on main level with bedroom/bathroom Home Equipment: Walker - 2 wheels      Prior Function Level of Independence: Independent         Comments: Pt has a wife that he states has a caregiver during the day. He does not normally assist with ADLs due to the caregiver being  available.     Hand Dominance        Extremity/Trunk Assessment   Upper Extremity Assessment Upper Extremity Assessment: Defer to OT evaluation    Lower Extremity Assessment Lower Extremity Assessment: Overall WFL for tasks assessed (official MMT deferred on LLE, but pt able to lift both extremities against gravity, freely moved them for bed mobility.)    Cervical / Trunk Assessment Cervical / Trunk Assessment: Normal  Communication       Cognition Arousal/Alertness: Awake/alert Behavior During Therapy: WFL for tasks assessed/performed Overall Cognitive Status: Within Functional Limits for tasks assessed                                        General Comments      Exercises     Assessment/Plan    PT Assessment Patient needs continued PT services  PT Problem List Decreased strength;Decreased mobility;Decreased activity tolerance;Decreased balance;Pain;Decreased range of motion       PT Treatment Interventions DME instruction;Therapeutic exercise;Gait training;Balance training;Stair training;Neuromuscular re-education;Functional mobility training;Therapeutic activities;Patient/family education    PT Goals (Current goals can be found in the Care Plan section)  Acute Rehab PT Goals Patient Stated Goal: to have decreased leg pain PT Goal Formulation: With patient Time For Goal Achievement: 03/28/21 Potential to Achieve Goals: Good    Frequency Min 2X/week   Barriers to discharge        Co-evaluation               AM-PAC PT "6 Clicks" Mobility  Outcome Measure Help needed turning from your back to your side while in a flat bed without using bedrails?: None Help needed moving from lying on your back to sitting on the side of a flat bed without using bedrails?: None Help needed moving to and from a bed to a chair (including a wheelchair)?: None Help needed standing up from a chair using your arms (e.g., wheelchair or bedside chair)?: A Little Help needed to walk in hospital room?: A Little Help needed climbing 3-5 steps with a railing? : A Little 6 Click Score: 21    End of Session Equipment Utilized During Treatment: Gait belt Activity Tolerance: Patient tolerated treatment well Patient left: in chair;with call bell/phone within reach;with nursing/sitter in room Nurse Communication: Mobility status PT Visit Diagnosis: Other abnormalities of gait and mobility (R26.89);Difficulty in  walking, not elsewhere classified (R26.2);Muscle weakness (generalized) (M62.81);Pain Pain - Right/Left: Left Pain - part of body: Leg    Time: 0071-2197 PT Time Calculation (min) (ACUTE ONLY): 22 min   Charges:   PT Evaluation $PT Eval Low Complexity: 1 Low PT Treatments $Gait Training: 8-22 mins       Lieutenant Diego PT, DPT 1:14 PM,03/14/21

## 2021-03-14 NOTE — Progress Notes (Signed)
[facility]  Daily Progress Note   Subjective  -   Patient states his pain is a 4 out of 10 he ambulated 4 times around the nursing station  Objective Vitals:   03/14/21 1600 03/14/21 1652 03/14/21 1700 03/14/21 1800  BP: 139/62  (!) 147/70   Pulse: 72  85 (!) 39  Resp: 19  (!) 23 (!) 31  Temp:  99.1 F (37.3 C)    TempSrc:  Axillary    SpO2: 99%  98% (!) 78%  Weight:      Height:        Intake/Output Summary (Last 24 hours) at 03/14/2021 1847 Last data filed at 03/14/2021 1800 Gross per 24 hour  Intake 1538.34 ml  Output 1800 ml  Net -261.66 ml    PULM  CTAB CV  RRR VASC  left foot is warm with brisk capillary refill left groin is clean dry and intact  Laboratory CBC    Component Value Date/Time   WBC 10.1 03/14/2021 0544   HGB 8.4 (L) 03/14/2021 0544   HCT 26.0 (L) 03/14/2021 0544   PLT 104 (L) 03/14/2021 0544    BMET    Component Value Date/Time   NA 140 03/14/2021 0544   K 4.2 03/14/2021 0544   CL 112 (H) 03/14/2021 0544   CO2 23 03/14/2021 0544   GLUCOSE 127 (H) 03/14/2021 0544   BUN 18 03/14/2021 0544   CREATININE 1.36 (H) 03/14/2021 0544   CALCIUM 8.8 (L) 03/14/2021 0544   GFRNONAA 51 (L) 03/14/2021 0544   GFRAA 45 (L) 10/01/2019 0327    Assessment/Planning: POD #1 s/p left femoral endarterectomy with patch angioplasty   Patient is doing well he is ambulating with minimal difficulty vital signs are stable.  I will plan for discharge tomorrow.    Hortencia Pilar  03/14/2021, 6:47 PM

## 2021-03-14 NOTE — Plan of Care (Signed)
  Problem: Clinical Measurements: Goal: Respiratory complications will improve Outcome: Progressing Goal: Cardiovascular complication will be avoided Outcome: Progressing   Problem: Nutrition: Goal: Adequate nutrition will be maintained Outcome: Progressing   Problem: Pain Managment: Goal: General experience of comfort will improve Outcome: Progressing   Problem: Safety: Goal: Ability to remain free from injury will improve Outcome: Progressing   

## 2021-03-15 ENCOUNTER — Ambulatory Visit: Payer: Medicare PPO | Admitting: Physician Assistant

## 2021-03-15 MED ORDER — HYDROCODONE-ACETAMINOPHEN 5-325 MG PO TABS: 1.0000 | ORAL_TABLET | Freq: Four times a day (QID) | ORAL | 0 refills | Status: DC | PRN

## 2021-03-15 NOTE — Plan of Care (Signed)

## 2021-03-15 NOTE — Discharge Summary (Signed)
[facility]    Discharge Summary    Patient ID:  Brandon Lewis MRN: [id number] DOB/AGE: Sep 17, 1934 87 y.o.  Admit date: 03/13/2021 Discharge date: 03/15/2021 Date of Surgery: 03/13/2021 Surgeon: Surgeon(s): Dew, Erskine Squibb, MD Brixton Schnapp, Dolores Lory, MD  Admission Diagnosis: Atherosclerosis of artery of extremity with rest pain [facility]) [I70.229]  Discharge Diagnoses:  Atherosclerosis of artery of extremity with rest pain [facility]) [I70.229]  Secondary Diagnoses: Past Medical History:  Diagnosis Date  . Anemia    h/o  . Aortic atherosclerosis ([place])   . Bladder cancer ([place])    still receiving treatments  . CAD (coronary artery disease)    s/p PCI after presenting with exertional angina (drug eluting stent)  . Chronic kidney disease    STAGE 3B  . Diverticulosis   . GERD (gastroesophageal reflux disease)   . History of kidney stones    h/o  . HTN (hypertension)   . 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 Hyperlipidemia   . Myocardial infarction ([place]) 09/2019   s/p CABG x 4  . Osteoarthritis   . PVD (peripheral vascular disease) ([place])    s/p aortobifemoral bypass, with bilateral SFA occlusion and intermittent claudication   . Small bowel obstruction ([place])    after surgeries  . Thoracic ascending aortic aneurysm ([place])   . Thyroid nodule     Procedure(s): ENDARTERECTOMY FEMORAL (GROIN REVISION) APPLICATION OF CELL SAVER  Discharged Condition: good  HPI:  Patient presented on the day of surgery underwent revision of the left aortobifemoral groin anastomosis.  CorMatrix patch angioplasty was performed with endarterectomy.  Postoperatively he has had a normal course.  He is ambulated with minimal assistance.  He is vital signs have been stable.  He notes that his leg pain has improved significantly.  On postoperative day #2 he is fit for discharge  Hospital Course:  Brandon Lewis is a 87 y.o. male is S/P  Left Procedure(s): ENDARTERECTOMY FEMORAL (GROIN REVISION) APPLICATION OF CELL SAVER Extubated: POD # 0 Physical exam: Left groin is clean clean dry and intact.  Left foot is hyperemic with strong biphasic to triphasic Doppler signals Post-op wounds clean, dry, intact or healing well Pt. Ambulating, voiding and taking PO diet without difficulty. Pt pain controlled with PO pain meds. Labs as below Complications:none  Consults:    Significant Diagnostic Studies: CBC Lab Results  Component Value Date   WBC 10.1 03/14/2021   HGB 8.4 (L) 03/14/2021   HCT 26.0 (L) 03/14/2021   MCV 123.2 (H) 03/14/2021   PLT 104 (L) 03/14/2021    BMET    Component Value Date/Time   NA 140 03/14/2021 0544   K 4.2 03/14/2021 0544   CL 112 (H) 03/14/2021 0544   CO2 23 03/14/2021 0544   GLUCOSE 127 (H) 03/14/2021 0544   BUN 18 03/14/2021 0544   CREATININE 1.36 (H) 03/14/2021 0544   CALCIUM 8.8 (L) 03/14/2021 0544   GFRNONAA 51 (L) 03/14/2021 0544   GFRAA 45 (L) 10/01/2019 0327   COAG Lab Results  Component Value Date   INR 1.1 03/05/2021   INR 1.3 (H) 09/16/2019   INR 1.1 09/14/2019     Disposition:  Discharge to :Home Discharge Instructions    Call MD for:  redness, tenderness, or signs of infection (pain, swelling, bleeding, redness, odor or green/yellow discharge around incision site)   Complete by: As directed    Call MD for:  severe or increased  pain, loss or decreased feeling  in affected limb(s)   Complete by: As directed    Call MD for:  temperature >100.5   Complete by: As directed    Discharge instructions   Complete by: As directed    Remove the bandage from the left groin on Sunday OK to shower no tub baths   Driving Restrictions   Complete by: As directed    No driving for 2 weeks   Lifting restrictions   Complete by: As directed    No lifting for 2 weeks   No dressing needed   Complete by: As directed    Replace only if drainage present   Resume previous diet    Complete by: As directed      Allergies as of 03/15/2021      Reactions   Cilostazol Other (See Comments)   Bowel upset   Simvastatin Other (See Comments)   myalgia      Medication List    TAKE these medications   aspirin EC 81 MG tablet Take 81 mg by mouth in the morning. Swallow whole.   clopidogrel 75 MG tablet Commonly known as: PLAVIX *VIAL ONLY* TAKE ONE TABLET BY MOUTH EVERY DAY What changed: See the new instructions.   HYDROcodone-acetaminophen 5-325 MG tablet Commonly known as: Norco Take 1-2 tablets by mouth every 6 (six) hours as needed for moderate pain or severe pain.   hydrocortisone 2.5 % rectal cream Commonly known as: ANUSOL-HC Place 1 application rectally daily as needed for hemorrhoids or anal itching.   MAGNESIUM GLYCINATE PO Take 500 mg by mouth every evening.   metoprolol succinate 50 MG 24 hr tablet Commonly known as: TOPROL-XL Take 1 tablet (50 mg total) by mouth in the morning.   multivitamin with minerals Tabs tablet Take 1 tablet by mouth daily. Centrum Silver   pantoprazole 40 MG tablet Commonly known as: PROTONIX Take 40 mg by mouth daily as needed (indigestion/heartburn).   rosuvastatin 10 MG tablet Commonly known as: CRESTOR Take 1 tablet (10 mg total) by mouth every evening.   sodium chloride 0.65 % nasal spray Commonly known as: OCEAN Place 1 spray into the nose daily as needed (sinus congestion).   tamsulosin 0.4 MG Caps capsule Commonly known as: FLOMAX TAKE 1 CAPSULE BY MOUTH ONCE DAILY What changed: when to take this   Vitamin D 50 MCG (2000 UT) Caps Take 2,000 Units by mouth in the morning.      Verbal and written Discharge instructions given to the patient. Wound care per Discharge AVS  Follow-up Information    Marcene Laskowski, Dolores Lory, MD Follow up in 2 week(s).   Specialties: Vascular Surgery, Cardiology, Radiology, Vascular Surgery Why: with an ABI Contact information: [place]  [phone]               Signed: Hortencia Pilar, MD  03/15/2021, 1:54 PM

## 2021-03-15 NOTE — Plan of Care (Signed)
  Problem: Education: Goal: Knowledge of General Education information will improve Description: Including pain rating scale, medication(s)/side effects and non-pharmacologic comfort measures Outcome: Adequate for Discharge   Problem: Health Behavior/Discharge Planning: Goal: Ability to manage health-related needs will improve Outcome: Adequate for Discharge   Problem: Clinical Measurements: Goal: Ability to maintain clinical measurements within normal limits will improve Outcome: Adequate for Discharge Goal: Will remain free from infection Outcome: Adequate for Discharge Goal: Cardiovascular complication will be avoided Outcome: Adequate for Discharge   Problem: Activity: Goal: Risk for activity intolerance will decrease Outcome: Adequate for Discharge   Problem: Nutrition: Goal: Adequate nutrition will be maintained Outcome: Adequate for Discharge   Problem: Coping: Goal: Level of anxiety will decrease Outcome: Adequate for Discharge   Problem: Elimination: Goal: Will not experience complications related to urinary retention Outcome: Adequate for Discharge   Problem: Pain Managment: Goal: General experience of comfort will improve Outcome: Adequate for Discharge   Problem: Safety: Goal: Ability to remain free from injury will improve Outcome: Adequate for Discharge   Problem: Skin Integrity: Goal: Risk for impaired skin integrity will decrease Outcome: Adequate for Discharge

## 2021-03-20 ENCOUNTER — Telehealth (INDEPENDENT_AMBULATORY_CARE_PROVIDER_SITE_OTHER): Payer: Self-pay

## 2021-03-20 NOTE — Telephone Encounter (Signed)
Patient called stating he has some swelling with his leg after his surgery on 03/13/21 and he had an episode of dizziness yesterday and today that was prolonged. I advised that some swelling is normal after the surgery he had which was a left femoral endarterectomy revision with Dr. Delana Meyer. I did advise the patient to contact his PCP regarding the dizziness as he is a week out from surgery and anesthesia being used during his surgery. Patient was transferred to make an appt for surgery follow up.

## 2021-03-22 ENCOUNTER — Ambulatory Visit: Payer: Medicare PPO | Admitting: Physician Assistant

## 2021-03-25 ENCOUNTER — Emergency Department: Payer: Medicare PPO

## 2021-03-25 ENCOUNTER — Emergency Department
Admission: EM | Admit: 2021-03-25 | Discharge: 2021-03-25 | Disposition: A | Discharge status: Home / Self Care | Payer: Medicare PPO | Attending: Emergency Medicine | Admitting: Emergency Medicine

## 2021-03-25 ENCOUNTER — Other Ambulatory Visit: Payer: Self-pay

## 2021-03-25 DIAGNOSIS — I252 Old myocardial infarction: Secondary | ICD-10-CM | POA: Diagnosis not present

## 2021-03-25 DIAGNOSIS — Z79899 Other long term (current) drug therapy: Secondary | ICD-10-CM | POA: Diagnosis not present

## 2021-03-25 DIAGNOSIS — Z951 Presence of aortocoronary bypass graft: Secondary | ICD-10-CM | POA: Diagnosis not present

## 2021-03-25 DIAGNOSIS — F17210 Nicotine dependence, cigarettes, uncomplicated: Secondary | ICD-10-CM | POA: Diagnosis not present

## 2021-03-25 DIAGNOSIS — I129 Hypertensive chronic kidney disease with stage 1 through stage 4 chronic kidney disease, or unspecified chronic kidney disease: Secondary | ICD-10-CM | POA: Diagnosis not present

## 2021-03-25 DIAGNOSIS — N1832 Chronic kidney disease, stage 3b: Secondary | ICD-10-CM | POA: Insufficient documentation

## 2021-03-25 DIAGNOSIS — R42 Dizziness and giddiness: Secondary | ICD-10-CM | POA: Diagnosis not present

## 2021-03-25 DIAGNOSIS — R519 Headache, unspecified: Secondary | ICD-10-CM | POA: Diagnosis not present

## 2021-03-25 DIAGNOSIS — I2510 Atherosclerotic heart disease of native coronary artery without angina pectoris: Secondary | ICD-10-CM | POA: Insufficient documentation

## 2021-03-25 DIAGNOSIS — Z743 Need for continuous supervision: Secondary | ICD-10-CM | POA: Diagnosis not present

## 2021-03-25 DIAGNOSIS — Z8551 Personal history of malignant neoplasm of bladder: Secondary | ICD-10-CM | POA: Diagnosis not present

## 2021-03-25 DIAGNOSIS — Z7982 Long term (current) use of aspirin: Secondary | ICD-10-CM | POA: Insufficient documentation

## 2021-03-25 DIAGNOSIS — R404 Transient alteration of awareness: Secondary | ICD-10-CM | POA: Diagnosis not present

## 2021-03-25 LAB — URINALYSIS, COMPLETE (UACMP) WITH MICROSCOPIC
Bacteria, UA: NONE SEEN
Bilirubin Urine: NEGATIVE
Glucose, UA: NEGATIVE mg/dL
Hgb urine dipstick: NEGATIVE
Ketones, ur: NEGATIVE mg/dL
Leukocytes,Ua: NEGATIVE
Nitrite: NEGATIVE
Protein, ur: NEGATIVE mg/dL
Specific Gravity, Urine: 1.015 (ref 1.005–1.030)
Squamous Epithelial / HPF: NONE SEEN (ref 0–5)
pH: 5 (ref 5.0–8.0)

## 2021-03-25 LAB — BASIC METABOLIC PANEL
Anion gap: 6 (ref 5–15)
BUN: 26 mg/dL — ABNORMAL HIGH (ref 8–23)
CO2: 25 mmol/L (ref 22–32)
Calcium: 9.3 mg/dL (ref 8.9–10.3)
Chloride: 106 mmol/L (ref 98–111)
Creatinine, Ser: 1.5 mg/dL — ABNORMAL HIGH (ref 0.61–1.24)
GFR, Estimated: 46 mL/min — ABNORMAL LOW (ref >60)
Glucose, Bld: 189 mg/dL — ABNORMAL HIGH (ref 70–99)
Potassium: 4.3 mmol/L (ref 3.5–5.1)
Sodium: 137 mmol/L (ref 135–145)

## 2021-03-25 LAB — CBC
HCT: 28.3 % — ABNORMAL LOW (ref 39.0–52.0)
Hemoglobin: 8.9 g/dL — ABNORMAL LOW (ref 13.0–17.0)
MCH: 38.4 pg — ABNORMAL HIGH (ref 26.0–34.0)
MCHC: 31.4 g/dL (ref 30.0–36.0)
MCV: 122 fL — ABNORMAL HIGH (ref 80.0–100.0)
Platelets: 226 10*3/uL (ref 150–400)
RBC: 2.32 MIL/uL — ABNORMAL LOW (ref 4.22–5.81)
RDW: 17 % — ABNORMAL HIGH (ref 11.5–15.5)
WBC: 11 10*3/uL — ABNORMAL HIGH (ref 4.0–10.5)
nRBC: 0 % (ref 0.0–0.2)

## 2021-03-25 NOTE — ED Provider Notes (Signed)
[facility] Emergency Department Provider Note   ____________________________________________   Event Date/Time   First MD Initiated Contact with Patient 03/25/21 1350     (approximate)  I have reviewed the triage vital signs and the nursing notes.   HISTORY  Chief Complaint Dizziness    HPI Brandon Lewis is a 87 y.o. male with past medical history of hypertension, hyperlipidemia, CAD status post CABG, CKD, and peripheral vascular disease who presents to the ED for dizziness.  Reports that he has had intermittent dizziness over the past couple of days which seem to be more severe after waking up this morning.  He states that he was sitting on his couch when he began to feel like the room is spinning around him.  He denies any associated nausea or vomiting.  He has not had any vision changes, speech changes, or weakness but does endorse some numbness and tingling in his right arm.  Symptoms do not seem to be associated with positional changes but do seem to have slightly improved without intervention.  He denies any lightheadedness or feeling like he is going to pass out, also denies any chest pain or shortness of breath.  He reports recent left femoral endarterectomy but states his access site in his groin has been healing well.        Past Medical History:  Diagnosis Date  . Anemia    h/o  . Aortic atherosclerosis ([place])   . Bladder cancer ([place])    still receiving treatments  . CAD (coronary artery disease)    s/p PCI after presenting with exertional angina (drug eluting stent)  . Chronic kidney disease    STAGE 3B  . Diverticulosis   . GERD (gastroesophageal reflux disease)   . History of kidney stones    h/o  . HTN (hypertension)   . 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 Hyperlipidemia   . Myocardial infarction ([place]) 09/2019   s/p CABG x 4  . Osteoarthritis   . PVD (peripheral vascular disease) ([place])     s/p aortobifemoral bypass, with bilateral SFA occlusion and intermittent claudication   . Small bowel obstruction ([place])    after surgeries  . Thoracic ascending aortic aneurysm ([place])   . Thyroid nodule     Patient Active Problem List   Diagnosis Date Noted  . Atherosclerosis of artery of extremity with rest pain ([place]) 03/13/2021  . Acute diverticulitis 10/18/2020  . Rectal bleeding 10/18/2020  . Thoracic ascending aortic aneurysm ([place]) 05/17/2020  . Aortic atherosclerosis ([place]) 05/17/2020  . Hematuria 05/09/2020  . BPH (benign prostatic hyperplasia) 11/16/2019  . S/P CABG x 4 11/04/2019  . Atrial flutter ([place]) 11/04/2019  . Coronary artery disease 09/16/2019  . Allergic rhinitis 09/12/2019  . Coronary artery disease due to lipid rich plaque   . Essential hypertension   . Sciatica, right side 09/22/2018  . Chronic renal disease, stage III ([place]) 09/14/2018  . BCC (basal cell carcinoma), face 04/12/2018  . Actinic keratosis 08/14/2015  . Advanced directives, counseling/discussion 07/25/2014  . IMPAIRED FASTING GLUCOSE 03/08/2010  . CAD, NATIVE VESSEL 05/30/2009  . Hyperlipemia 03/02/2009  . Essential hypertension, benign 09/22/2007  . PAD (peripheral artery disease) ([place]) 09/22/2007  . Osteoarthritis, generalized 09/22/2007    Past Surgical History:  Procedure Laterality Date  . aorto-bifem  2003   hayes   . CAROTID STENT     cooper  . CATARACT EXTRACTION, BILATERAL  2017  .  CHOLECYSTECTOMY  2002  . COLONOSCOPY    . CORONARY ARTERY BYPASS GRAFT N/A 09/16/2019   Procedure: CORONARY ARTERY BYPASS GRAFTING (CABG) using LIMA to LAD; Endoscopic right saphenous vein harvest to OM1, Diag1, and RCA.;  Surgeon: Wonda Olds, MD;  Location: [place];  Service: Open Heart Surgery;  Laterality: N/A;  . ENDARTERECTOMY FEMORAL Left 03/13/2021   Procedure: ENDARTERECTOMY FEMORAL (GROIN REVISION);  Surgeon: Katha Cabal, MD;  Location: [facility];  Service: Vascular;  Laterality:  Left;  . LEFT HEART CATH AND CORONARY ANGIOGRAPHY N/A 09/12/2019   Procedure: LEFT HEART CATH AND CORONARY ANGIOGRAPHY;  Surgeon: Corey Skains, MD;  Location: [place] [facility];  Service: Cardiovascular;  Laterality: N/A;  . LITHOTRIPSY    . LOWER EXTREMITY ANGIOGRAPHY Left 01/15/2021   Procedure: LOWER EXTREMITY ANGIOGRAPHY;  Surgeon: Katha Cabal, MD;  Location: [place] [facility];  Service: Cardiovascular;  Laterality: Left;  . TEE WITHOUT CARDIOVERSION N/A 09/16/2019   Procedure: TRANSESOPHAGEAL ECHOCARDIOGRAM (TEE);  Surgeon: Wonda Olds, MD;  Location: [place];  Service: Open Heart Surgery;  Laterality: N/A;  . TONSILLECTOMY    . TRANSURETHRAL RESECTION OF BLADDER TUMOR WITH MITOMYCIN-C N/A 07/24/2020   Procedure: TRANSURETHRAL RESECTION OF BLADDER TUMOR WITH Gemcitabine;  Surgeon: Abbie Sons, MD;  Location: [facility];  Service: Urology;  Laterality: N/A;  . WISDOM TOOTH EXTRACTION      Prior to Admission medications   Medication Sig Start Date End Date Taking? Authorizing Provider  aspirin EC 81 MG tablet Take 81 mg by mouth in the morning. Swallow whole.    [provider]  Cholecalciferol (VITAMIN D) 50 MCG (2000 UT) CAPS Take 2,000 Units by mouth in the morning.    [provider]  clopidogrel (PLAVIX) 75 MG tablet *VIAL ONLY* TAKE ONE TABLET BY MOUTH EVERY DAY 03/13/21   Venia Carbon, MD  HYDROcodone-acetaminophen (NORCO) 5-325 MG tablet Take 1-2 tablets by mouth every 6 (six) hours as needed for moderate pain or severe pain. 03/15/21   Schnier, Dolores Lory, MD  hydrocortisone (ANUSOL-HC) 2.5 % rectal cream Place 1 application rectally daily as needed for hemorrhoids or anal itching.    [provider]  MAGNESIUM GLYCINATE PO Take 500 mg by mouth every evening.    [provider]  metoprolol succinate (TOPROL-XL) 50 MG 24 hr tablet Take 1 tablet (50 mg total) by mouth in the morning. 03/12/21   Venia Carbon, MD   Multiple Vitamin (MULTIVITAMIN WITH MINERALS) TABS tablet Take 1 tablet by mouth daily. Centrum Silver    [provider]  pantoprazole (PROTONIX) 40 MG tablet Take 40 mg by mouth daily as needed (indigestion/heartburn).    [provider]  rosuvastatin (CRESTOR) 10 MG tablet Take 1 tablet (10 mg total) by mouth every evening. 03/12/21   Venia Carbon, MD  sodium chloride (OCEAN) 0.65 % nasal spray Place 1 spray into the nose daily as needed (sinus congestion).    [provider]  tamsulosin (FLOMAX) 0.4 MG CAPS capsule TAKE 1 CAPSULE BY MOUTH ONCE DAILY Patient taking differently: Take 0.4 mg by mouth daily after breakfast. 01/31/21   Venia Carbon, MD    Allergies Cilostazol and Simvastatin  Family History  Problem Relation Age of Onset  . Diabetes Father        DM - and brother   . Stroke Father   . Coronary artery disease Paternal Grandfather   . Colon cancer Neg Hx   .  Prostate cancer Neg Hx     Social History Social History   Tobacco Use  . Smoking status: Former Smoker    Packs/day: 1.00    Years: 20.00    Pack years: 20.00    Types: Cigarettes    Quit date: 10/20/1992    Years since quitting: 28.4  . Smokeless tobacco: Never Used  . Tobacco comment: quit in 1994   Vaping Use  . Vaping Use: Never used  Substance Use Topics  . Alcohol use: Yes    Comment: rare  . Drug use: No    Review of Systems  Constitutional: No fever/chills Eyes: No visual changes. ENT: No sore throat. Cardiovascular: Denies chest pain. Respiratory: Denies shortness of breath. Gastrointestinal: No abdominal pain.  No nausea, no vomiting.  No diarrhea.  No constipation. Genitourinary: Negative for dysuria. Musculoskeletal: Negative for back pain. Skin: Negative for rash. Neurological: Negative for headaches, focal weakness or numbness.  Positive for dizziness.  ____________________________________________   PHYSICAL EXAM:  VITAL SIGNS: ED Triage  Vitals  Enc Vitals Group     BP 03/25/21 1349 (!) 146/83     Pulse Rate 03/25/21 1349 83     Resp 03/25/21 1349 18     Temp 03/25/21 1349 98 F (36.7 C)     Temp src --      SpO2 03/25/21 1349 99 %     Weight 03/25/21 1353 211 lb 10.3 oz (96 kg)     Height 03/25/21 1353 5\' 10"  (1.778 m)     Head Circumference --      Peak Flow --      Pain Score 03/25/21 1347 2     Pain Loc --      Pain Edu? --      Excl. in [place]? --     Constitutional: Alert and oriented. Eyes: Conjunctivae are normal. Head: Atraumatic. Nose: No congestion/rhinnorhea. Mouth/Throat: Mucous membranes are moist. Neck: Normal ROM Cardiovascular: Normal rate, regular rhythm. Grossly normal heart sounds.  2+ radial pulses bilaterally. Respiratory: Normal respiratory effort.  No retractions. Lungs CTAB. Gastrointestinal: Soft and nontender. No distention. Genitourinary: deferred Musculoskeletal: No lower extremity tenderness nor edema. Neurologic:  Normal speech and language. No gross focal neurologic deficits are appreciated. Skin:  Skin is warm, dry and intact. No rash noted.  Left femoral surgical site clean, dry, and intact. Psychiatric: Mood and affect are normal. Speech and behavior are normal.  ____________________________________________   LABS (all labs ordered are listed, but only abnormal results are displayed)  Labs Reviewed  BASIC METABOLIC PANEL - Abnormal; Notable for the following components:      Result Value   Glucose, Bld 189 (*)    BUN 26 (*)    Creatinine, Ser 1.50 (*)    GFR, Estimated 46 (*)    All other components within normal limits  CBC - Abnormal; Notable for the following components:   WBC 11.0 (*)    RBC 2.32 (*)    Hemoglobin 8.9 (*)    HCT 28.3 (*)    MCV 122.0 (*)    MCH 38.4 (*)    RDW 17.0 (*)    All other components within normal limits  URINALYSIS, COMPLETE (UACMP) WITH MICROSCOPIC - Abnormal; Notable for the following components:   Color, Urine YELLOW (*)     APPearance CLEAR (*)    All other components within normal limits  CBG MONITORING, ED   ____________________________________________  EKG  ED ECG REPORT I, Blake Divine, the attending  physician, personally viewed and interpreted this ECG.   Date: 03/25/2021  EKG Time: 13:52  Rate: 81  Rhythm: normal sinus rhythm  Axis: Normal  Intervals:none  ST&T Change: None   PROCEDURES  Procedure(s) performed (including Critical Care):  Procedures   ____________________________________________   INITIAL IMPRESSION / ASSESSMENT AND PLAN / ED COURSE       87 year old male with past medical history of hypertension, hyperlipidemia, CAD status post CABG, CKD, and peripheral vascular disease who presents to the ED for intermittent dizziness over the past couple of days that has become more severe and constant today.  He reports some numbness and tingling in his right upper extremity but has no focal neurologic deficits on my exam.  Given dizziness is not associated with positional changes, presentation concerning for posterior circulation stroke.  He is outside the window for acute intervention and I have low suspicion for large vessel occlusion.  We will further assess with MRI brain to rule out stroke.  EKG shows no evidence of arrhythmia or ischemia and labs thus far are unremarkable.  Patient turned over to oncoming provider pending MRI results, if these are negative patient would be appropriate for discharge home with PCP follow-up.      ____________________________________________   FINAL CLINICAL IMPRESSION(S) / ED DIAGNOSES  Final diagnoses:  Dizziness     ED Discharge Orders    None       Note:  This document was prepared using Dragon voice recognition software and may include unintentional dictation errors.   Blake Divine, MD 03/25/21 [phone]

## 2021-03-25 NOTE — Discharge Instructions (Addendum)
We suspect that your dizziness may be due to vertigo.  Your MRI shows: Mild burden of chronic microhemorrhages in a pattern more suggestive  of amyloid angiopathy and chronic hypertension.      This should be followed up by your primary care doctor.  Return to the ER for new, worsening, or persistent severe dizziness, weakness, severe headache, memory loss, confusion, vomiting, vision changes, difficulty walking, or any other new or worsening symptoms that concern you.

## 2021-03-25 NOTE — ED Notes (Signed)
Resumed care from amber rn.  Pt alert, to mri scan.

## 2021-03-25 NOTE — ED Notes (Signed)
MRI screening patient at this time. 

## 2021-03-25 NOTE — ED Notes (Signed)
Pt back from mri, pt alert, speech clear  No headache.  States I feel fuzzy.  nsr on monitor.  Iv in place.

## 2021-03-25 NOTE — ED Provider Notes (Signed)
-----------------------------------------   6:53 PM on 03/25/2021 -----------------------------------------  I took over care of this patient from Dr. Charna Archer.  The patient was pending a urinalysis which was negative, and an MRI which shows no evidence of recent stroke.  The patient has chronic microvascular ischemic changes and chronic appearing microhemorrhages consistent with possible amyloid and will apathy or hypertension.  On reassessment, the patient is comfortable.  He states that his symptoms have mostly resolved and he feels well to go home.  I counseled him on the results of the work-up.  He agrees to follow-up with primary care doctor.  Return precautions given, and he expresses understanding.   Arta Silence, MD 03/25/21 [phone]

## 2021-03-25 NOTE — ED Triage Notes (Signed)
Pt comes via EMS from home with c/o dizziness and headache that recently started. Pt states recent procedure on May 25th and has since has these symptoms. Pt states headache is new.  EMS reports VSS.  Pt denies any CP or SOB

## 2021-03-26 ENCOUNTER — Telehealth: Payer: Self-pay | Admitting: Internal Medicine

## 2021-03-26 NOTE — Telephone Encounter (Signed)
error 

## 2021-03-27 ENCOUNTER — Other Ambulatory Visit (INDEPENDENT_AMBULATORY_CARE_PROVIDER_SITE_OTHER): Payer: Self-pay | Admitting: Vascular Surgery

## 2021-03-27 DIAGNOSIS — I70212 Atherosclerosis of native arteries of extremities with intermittent claudication, left leg: Secondary | ICD-10-CM

## 2021-03-29 ENCOUNTER — Ambulatory Visit (INDEPENDENT_AMBULATORY_CARE_PROVIDER_SITE_OTHER): Payer: Medicare PPO | Admitting: Nurse Practitioner

## 2021-03-29 ENCOUNTER — Encounter (INDEPENDENT_AMBULATORY_CARE_PROVIDER_SITE_OTHER): Payer: Self-pay | Admitting: Nurse Practitioner

## 2021-03-29 ENCOUNTER — Other Ambulatory Visit: Payer: Self-pay

## 2021-03-29 ENCOUNTER — Ambulatory Visit (INDEPENDENT_AMBULATORY_CARE_PROVIDER_SITE_OTHER): Payer: Medicare PPO

## 2021-03-29 VITALS — BP 133/72 | HR 86 | Resp 16 | Wt 196.2 lb

## 2021-03-29 DIAGNOSIS — I70212 Atherosclerosis of native arteries of extremities with intermittent claudication, left leg: Secondary | ICD-10-CM | POA: Diagnosis not present

## 2021-03-29 DIAGNOSIS — I70229 Atherosclerosis of native arteries of extremities with rest pain, unspecified extremity: Secondary | ICD-10-CM

## 2021-03-31 ENCOUNTER — Encounter (INDEPENDENT_AMBULATORY_CARE_PROVIDER_SITE_OTHER): Payer: Self-pay | Admitting: Nurse Practitioner

## 2021-03-31 NOTE — Progress Notes (Signed)
Subjective:    Patient ID: Brandon Lewis, male    DOB: [birth year], 87 y.o.   MRN: [id number] Chief Complaint  Patient presents with   Follow-up    [facility] 2wk post femoral endarterectomy     Brandon Lewis is an 87 year old male that returns today following intervention on 03/13/2021, including:  PROCEDURE: 1. Redo left common femoral, superficial femoral and profunda femoris endarterectomy with Cormatrix patch angioplasty   The patient does have some swelling and discomfort in his groin area however the incision is clean dry and intact.  There is no drainage.  The patient has resumed activity with moderation.  He denies any fevers or chills.  Overall he is recovering well.  Today noninvasive studies show an ABI of 0.70 on the right and 0.75 on the left.  The patient has monophasic/biphasic waveforms bilaterally.  The toe waveforms in the left are stronger than the right is slightly dampened.  Previous ABIs were 0.66 on the right and 0.59 on the left.  The patient does have known bilateral SFA occlusions.   Review of Systems  Cardiovascular:  Positive for leg swelling.  All other systems reviewed and are negative.     Objective:   Physical Exam Vitals reviewed.  HENT:     Head: Normocephalic.  Cardiovascular:     Rate and Rhythm: Normal rate.     Pulses: Decreased pulses.  Pulmonary:     Effort: Pulmonary effort is normal.  Musculoskeletal:     Right lower leg: 1+ Edema present.     Left lower leg: 1+ Edema present.  Neurological:     Mental Status: He is alert and oriented to person, place, and time.  Psychiatric:        Mood and Affect: Mood normal.        Behavior: Behavior normal.        Thought Content: Thought content normal.        Judgment: Judgment normal.    BP 133/72 (BP Location: Right Arm)   Pulse 86   Resp 16   Wt 196 lb 3.2 oz (89 kg)   BMI 28.15 kg/m   Past Medical History:  Diagnosis Date   Anemia    h/o   Aortic atherosclerosis ([facility])     Bladder cancer ([facility])    still receiving treatments   CAD (coronary artery disease)    s/p PCI after presenting with exertional angina (drug eluting stent)   Chronic kidney disease    STAGE 3B   Diverticulosis    GERD (gastroesophageal reflux disease)    History of kidney stones    h/o   HTN (hypertension)    Hx of CABG 09/16/2019   4 vessel; LIMA-LAD, SVG-RCA, SVG-OM1, SVG-D2   Hyperlipidemia    Myocardial infarction ([place]) 09/2019   s/p CABG x 4   Osteoarthritis    PVD (peripheral vascular disease) ([facility])    s/p aortobifemoral bypass, with bilateral SFA occlusion and intermittent claudication    Small bowel obstruction ([facility])    after surgeries   Thoracic ascending aortic aneurysm ([facility])    Thyroid nodule     Social History   Socioeconomic History   Marital status: Married    Spouse name: Not on file   Number of children: 3   Years of education: Not on file   Highest education level: Not on file  Occupational History   Occupation: Landscape architect    Comment: Retired  Tobacco Use   Smoking status: Former  Packs/day: 1.00    Years: 20.00    Pack years: 20.00    Types: Cigarettes    Quit date: 10/20/1992    Years since quitting: 28.4   Smokeless tobacco: Never   Tobacco comments:    quit in 1994   Vaping Use   Vaping Use: Never used  Substance and Sexual Activity   Alcohol use: Yes    Comment: rare   Drug use: No   Sexual activity: Not on file  Other Topics Concern   Not on file  Social History Narrative   Widowed; then remarried; 3 sons.      Has a living will - wife has health care POA    Son [name] would be alternate   Would want resuscitation attempts   Would accept brief trial of artificial nutrition      Pt signed designated party release form and gives [place] Rorie [phone] (home #), access to medical records. Can also leave msg on home answering machine. Cell # [phone]   Social Determinants of Health   Financial Resource Strain: Not  on file  Food Insecurity: Not on file  Transportation Needs: Not on file  Physical Activity: Not on file  Stress: Not on file  Social Connections: Not on file  Intimate Partner Violence: Not on file    Past Surgical History:  Procedure Laterality Date   aorto-bifem  2003   hayes    CAROTID STENT     [facility]   CATARACT EXTRACTION, BILATERAL  2017   CHOLECYSTECTOMY  2002   COLONOSCOPY     CORONARY ARTERY BYPASS GRAFT N/A 09/16/2019   Procedure: CORONARY ARTERY BYPASS GRAFTING (CABG) using LIMA to LAD; Endoscopic right saphenous vein harvest to OM1, Diag1, and RCA.;  Surgeon: Wonda Olds, MD;  Location: [place];  Service: Open Heart Surgery;  Laterality: N/A;   ENDARTERECTOMY FEMORAL Left 03/13/2021   Procedure: ENDARTERECTOMY FEMORAL (GROIN REVISION);  Surgeon: Katha Cabal, MD;  Location: [facility];  Service: Vascular;  Laterality: Left;   LEFT HEART CATH AND CORONARY ANGIOGRAPHY N/A 09/12/2019   Procedure: LEFT HEART CATH AND CORONARY ANGIOGRAPHY;  Surgeon: Corey Skains, MD;  Location: [place] [facility];  Service: Cardiovascular;  Laterality: N/A;   LITHOTRIPSY     LOWER EXTREMITY ANGIOGRAPHY Left 01/15/2021   Procedure: LOWER EXTREMITY ANGIOGRAPHY;  Surgeon: Katha Cabal, MD;  Location: [place] [facility];  Service: Cardiovascular;  Laterality: Left;   TEE WITHOUT CARDIOVERSION N/A 09/16/2019   Procedure: TRANSESOPHAGEAL ECHOCARDIOGRAM (TEE);  Surgeon: Wonda Olds, MD;  Location: [place];  Service: Open Heart Surgery;  Laterality: N/A;   TONSILLECTOMY     TRANSURETHRAL RESECTION OF BLADDER TUMOR WITH MITOMYCIN-C N/A 07/24/2020   Procedure: TRANSURETHRAL RESECTION OF BLADDER TUMOR WITH Gemcitabine;  Surgeon: Abbie Sons, MD;  Location: [facility];  Service: Urology;  Laterality: N/A;   WISDOM TOOTH EXTRACTION      Family History  Problem Relation Age of Onset   Diabetes Father        DM - and brother    Stroke Father    Coronary artery disease  Paternal Grandfather    Colon cancer Neg Hx    Prostate cancer Neg Hx     Allergies  Allergen Reactions   Cilostazol Other (See Comments)    Bowel upset   Simvastatin Other (See Comments)    myalgia    CBC Latest Ref Rng & Units 03/25/2021 03/14/2021 03/13/2021  WBC 4.0 -  10.5 K/uL 11.0(H) 10.1 7.6  Hemoglobin 13.0 - 17.0 g/dL 8.9(L) 8.4(L) 9.4(L)  Hematocrit 39.0 - 52.0 % 28.3(L) 26.0(L) 28.5(L)  Platelets 150 - 400 K/uL 226 104(L) 121(L)      CMP     Component Value Date/Time   NA 137 03/25/2021 1349   K 4.3 03/25/2021 1349   CL 106 03/25/2021 1349   CO2 25 03/25/2021 1349   GLUCOSE 189 (H) 03/25/2021 1349   BUN 26 (H) 03/25/2021 1349   CREATININE 1.50 (H) 03/25/2021 1349   CALCIUM 9.3 03/25/2021 1349   PROT 7.0 10/16/2020 0634   ALBUMIN 3.2 (L) 10/16/2020 0634   AST 23 10/16/2020 0634   ALT 13 10/16/2020 0634   ALKPHOS 60 10/16/2020 0634   BILITOT 1.1 10/16/2020 0634   GFRNONAA 46 (L) 03/25/2021 1349   GFRAA 45 (L) 10/01/2019 0327     No results found.     Assessment & Plan:   1. Atherosclerosis of artery of extremity with rest pain [facility]) The patient is doing well postoperatively.  Patient is advised to continue to lift no heavier than 10 pounds.  In he is able to increase activity as tolerated.  We will have the patient return in 6 weeks with an aortoiliac study as well as to assess his wound.   Current Outpatient Medications on File Prior to Visit  Medication Sig Dispense Refill   aspirin EC 81 MG tablet Take 81 mg by mouth in the morning. Swallow whole.     Cholecalciferol (VITAMIN D) 50 MCG (2000 UT) CAPS Take 2,000 Units by mouth in the morning.     clopidogrel (PLAVIX) 75 MG tablet *VIAL ONLY* TAKE ONE TABLET BY MOUTH EVERY DAY 90 tablet 3   HYDROcodone-acetaminophen (NORCO) 5-325 MG tablet Take 1-2 tablets by mouth every 6 (six) hours as needed for moderate pain or severe pain. 36 tablet 0   hydrocortisone (ANUSOL-HC) 2.5 % rectal cream Place 1  application rectally daily as needed for hemorrhoids or anal itching.     MAGNESIUM GLYCINATE PO Take 500 mg by mouth every evening.     metoprolol succinate (TOPROL-XL) 50 MG 24 hr tablet Take 1 tablet (50 mg total) by mouth in the morning. 90 tablet 1   Multiple Vitamin (MULTIVITAMIN WITH MINERALS) TABS tablet Take 1 tablet by mouth daily. Centrum Silver     pantoprazole (PROTONIX) 40 MG tablet Take 40 mg by mouth daily as needed (indigestion/heartburn).     rosuvastatin (CRESTOR) 10 MG tablet Take 1 tablet (10 mg total) by mouth every evening. 90 tablet 1   sodium chloride (OCEAN) 0.65 % nasal spray Place 1 spray into the nose daily as needed (sinus congestion).     tamsulosin (FLOMAX) 0.4 MG CAPS capsule TAKE 1 CAPSULE BY MOUTH ONCE DAILY (Patient taking differently: Take 0.4 mg by mouth daily after breakfast.) 30 capsule 11   No current facility-administered medications on file prior to visit.    There are no Patient Instructions on file for this visit. No follow-ups on file.   Kris Hartmann, NP

## 2021-04-01 ENCOUNTER — Other Ambulatory Visit: Payer: Self-pay

## 2021-04-01 ENCOUNTER — Ambulatory Visit: Payer: Medicare PPO | Admitting: Internal Medicine

## 2021-04-01 ENCOUNTER — Encounter: Payer: Self-pay | Admitting: Internal Medicine

## 2021-04-01 DIAGNOSIS — I739 Peripheral vascular disease, unspecified: Secondary | ICD-10-CM

## 2021-04-01 DIAGNOSIS — N1831 Chronic kidney disease, stage 3a: Secondary | ICD-10-CM

## 2021-04-01 DIAGNOSIS — R42 Dizziness and giddiness: Secondary | ICD-10-CM | POA: Diagnosis not present

## 2021-04-01 NOTE — Assessment & Plan Note (Signed)
GFR 46 ---stable despite dye load with vascular procedure

## 2021-04-01 NOTE — Assessment & Plan Note (Signed)
Symptoms were not clear cut and mild Then more severe with true vertigo type pattern Brief sensory change in right hand prompted ER visit Evaluation and MRI benign Minimal symptoms since then No action for now

## 2021-04-01 NOTE — Assessment & Plan Note (Signed)
Some improvement in circulation to feet with procedure No claudication Still on plavix

## 2021-04-01 NOTE — Progress Notes (Signed)
Subjective:    Patient ID: Brandon Lewis, male    DOB: 1934-10-15, 87 y.o.   MRN: [id number]  HPI Here for ER follow up This visit occurred during the SARS-CoV-2 public health emergency.  Safety protocols were in place, including screening questions prior to the visit, additional usage of staff PPE, and extensive cleaning of exam room while observing appropriate contact time as indicated for disinfecting solutions.   Had revision of aorto-bifem (endarterectomy) Felt fine for a few days--then started having intermittent dizziness Called Dr Les Pou to call here Decided to go to ER after severe episode again---"the room just moved violently" and some right hand tingling  Had reassuring neurologic exam Brain MRI was negative  At first--he thought it was "plain old dizziness"---like orthostatic Transient and went away quickly No nausea  Slight heavy feeling in his head now---"like I could bring on the dizziness"---with rapid head movement (he noticed that this caused some symptoms)  GFR 46  Current Outpatient Medications on File Prior to Visit  Medication Sig Dispense Refill   aspirin EC 81 MG tablet Take 81 mg by mouth in the morning. Swallow whole.     Cholecalciferol (VITAMIN D) 50 MCG (2000 UT) CAPS Take 2,000 Units by mouth in the morning.     clopidogrel (PLAVIX) 75 MG tablet *VIAL ONLY* TAKE ONE TABLET BY MOUTH EVERY DAY 90 tablet 3   HYDROcodone-acetaminophen (NORCO) 5-325 MG tablet Take 1-2 tablets by mouth every 6 (six) hours as needed for moderate pain or severe pain. 36 tablet 0   hydrocortisone (ANUSOL-HC) 2.5 % rectal cream Place 1 application rectally daily as needed for hemorrhoids or anal itching.     MAGNESIUM GLYCINATE PO Take 500 mg by mouth every evening.     metoprolol succinate (TOPROL-XL) 50 MG 24 hr tablet Take 1 tablet (50 mg total) by mouth in the morning. 90 tablet 1   Multiple Vitamin (MULTIVITAMIN WITH MINERALS) TABS tablet Take 1 tablet by  mouth daily. Centrum Silver     pantoprazole (PROTONIX) 40 MG tablet Take 40 mg by mouth daily as needed (indigestion/heartburn).     rosuvastatin (CRESTOR) 10 MG tablet Take 1 tablet (10 mg total) by mouth every evening. 90 tablet 1   sodium chloride (OCEAN) 0.65 % nasal spray Place 1 spray into the nose daily as needed (sinus congestion).     tamsulosin (FLOMAX) 0.4 MG CAPS capsule TAKE 1 CAPSULE BY MOUTH ONCE DAILY (Patient taking differently: Take 0.4 mg by mouth daily after breakfast.) 30 capsule 11   No current facility-administered medications on file prior to visit.    Allergies  Allergen Reactions   Cilostazol Other (See Comments)    Bowel upset   Simvastatin Other (See Comments)    myalgia    Past Medical History:  Diagnosis Date   Anemia    h/o   Aortic atherosclerosis (HCC)    Bladder cancer ([place])    still receiving treatments   CAD (coronary artery disease)    s/p PCI after presenting with exertional angina (drug eluting stent)   Chronic kidney disease    STAGE 3B   Diverticulosis    GERD (gastroesophageal reflux disease)    History of kidney stones    h/o   HTN (hypertension)    Hx of CABG 09/16/2019   4 vessel; LIMA-LAD, SVG-RCA, SVG-OM1, SVG-D2   Hyperlipidemia    Myocardial infarction ([place]) 09/2019   s/p CABG x 4   Osteoarthritis    PVD (peripheral vascular  disease) ([place])    s/p aortobifemoral bypass, with bilateral SFA occlusion and intermittent claudication    Small bowel obstruction ([place])    after surgeries   Thoracic ascending aortic aneurysm [facility])    Thyroid nodule     Past Surgical History:  Procedure Laterality Date   aorto-bifem  2003   [facility]    CAROTID STENT     [facility]   CATARACT EXTRACTION, BILATERAL  2017   CHOLECYSTECTOMY  2002   COLONOSCOPY     CORONARY ARTERY BYPASS GRAFT N/A 09/16/2019   Procedure: CORONARY ARTERY BYPASS GRAFTING (CABG) using LIMA to LAD; Endoscopic right saphenous vein harvest to OM1, Diag1, and RCA.;  Surgeon:  Wonda Olds, MD;  Location: [place];  Service: Open Heart Surgery;  Laterality: N/A;   ENDARTERECTOMY FEMORAL Left 03/13/2021   Procedure: ENDARTERECTOMY FEMORAL (GROIN REVISION);  Surgeon: Katha Cabal, MD;  Location: [facility];  Service: Vascular;  Laterality: Left;   LEFT HEART CATH AND CORONARY ANGIOGRAPHY N/A 09/12/2019   Procedure: LEFT HEART CATH AND CORONARY ANGIOGRAPHY;  Surgeon: Corey Skains, MD;  Location: [place] [facility];  Service: Cardiovascular;  Laterality: N/A;   LITHOTRIPSY     LOWER EXTREMITY ANGIOGRAPHY Left 01/15/2021   Procedure: LOWER EXTREMITY ANGIOGRAPHY;  Surgeon: Katha Cabal, MD;  Location: [place] [facility];  Service: Cardiovascular;  Laterality: Left;   TEE WITHOUT CARDIOVERSION N/A 09/16/2019   Procedure: TRANSESOPHAGEAL ECHOCARDIOGRAM (TEE);  Surgeon: Wonda Olds, MD;  Location: [place];  Service: Open Heart Surgery;  Laterality: N/A;   TONSILLECTOMY     TRANSURETHRAL RESECTION OF BLADDER TUMOR WITH MITOMYCIN-C N/A 07/24/2020   Procedure: TRANSURETHRAL RESECTION OF BLADDER TUMOR WITH Gemcitabine;  Surgeon: Abbie Sons, MD;  Location: [facility];  Service: Urology;  Laterality: N/A;   WISDOM TOOTH EXTRACTION      Family History  Problem Relation Age of Onset   Diabetes Father        DM - and brother    Stroke Father    Coronary artery disease Paternal Grandfather    Colon cancer Neg Hx    Prostate cancer Neg Hx     Social History   Socioeconomic History   Marital status: Married    Spouse name: Not on file   Number of children: 3   Years of education: Not on file   Highest education level: Not on file  Occupational History   Occupation: Landscape architect    Comment: Retired  Tobacco Use   Smoking status: Former    Packs/day: 1.00    Years: 20.00    Pack years: 20.00    Types: Cigarettes    Quit date: 10/20/1992    Years since quitting: 28.4   Smokeless tobacco: Never   Tobacco comments:    quit in 1994    Vaping Use   Vaping Use: Never used  Substance and Sexual Activity   Alcohol use: Yes    Comment: rare   Drug use: No   Sexual activity: Not on file  Other Topics Concern   Not on file  Social History Narrative   Widowed; then remarried; 3 sons.      Has a living will - wife has health care POA    Son [name] would be alternate   Would want resuscitation attempts   Would accept brief trial of artificial nutrition      Pt signed designated party release form and gives Revel Stellmach [phone] (home #), access  to medical records. Can also leave msg on home answering machine. Cell # [phone]   Social Determinants of Health   Financial Resource Strain: Not on file  Food Insecurity: Not on file  Transportation Needs: Not on file  Physical Activity: Not on file  Stress: Not on file  Social Connections: Not on file  Intimate Partner Violence: Not on file    Review of Systems Mild dizzy feeling in the bed--but only 1-2 seconds Eating okay Some swelling in left leg since the procedure (left femoral access)    Objective:   Physical Exam Constitutional:      Appearance: Normal appearance.  Eyes:     Comments: No nystagmus  Cardiovascular:     Rate and Rhythm: Normal rate and regular rhythm.     Heart sounds: No murmur heard.   No gallop.     Comments: Feet warm but no palpable pulses Pulmonary:     Effort: Pulmonary effort is normal.     Breath sounds: Normal breath sounds. No wheezing or rales.  Musculoskeletal:     Cervical back: Neck supple.     Comments: Mild edema--more on left  Lymphadenopathy:     Cervical: No cervical adenopathy.  Neurological:     General: No focal deficit present.     Mental Status: He is alert and oriented to person, place, and time.     Motor: No weakness.     Coordination: Coordination normal.     Gait: Gait normal.           Assessment & Plan:

## 2021-05-03 ENCOUNTER — Other Ambulatory Visit (INDEPENDENT_AMBULATORY_CARE_PROVIDER_SITE_OTHER): Payer: Self-pay | Admitting: Nurse Practitioner

## 2021-05-03 DIAGNOSIS — I70229 Atherosclerosis of native arteries of extremities with rest pain, unspecified extremity: Secondary | ICD-10-CM

## 2021-05-06 ENCOUNTER — Ambulatory Visit (INDEPENDENT_AMBULATORY_CARE_PROVIDER_SITE_OTHER): Payer: Medicare PPO | Admitting: Nurse Practitioner

## 2021-05-06 ENCOUNTER — Other Ambulatory Visit: Payer: Self-pay

## 2021-05-06 ENCOUNTER — Ambulatory Visit (INDEPENDENT_AMBULATORY_CARE_PROVIDER_SITE_OTHER): Payer: Medicare PPO

## 2021-05-06 ENCOUNTER — Encounter (INDEPENDENT_AMBULATORY_CARE_PROVIDER_SITE_OTHER): Payer: Self-pay | Admitting: Nurse Practitioner

## 2021-05-06 VITALS — BP 130/78 | HR 79 | Ht 71.0 in | Wt 201.0 lb

## 2021-05-06 DIAGNOSIS — I10 Essential (primary) hypertension: Secondary | ICD-10-CM

## 2021-05-06 DIAGNOSIS — E785 Hyperlipidemia, unspecified: Secondary | ICD-10-CM

## 2021-05-06 DIAGNOSIS — I70229 Atherosclerosis of native arteries of extremities with rest pain, unspecified extremity: Secondary | ICD-10-CM

## 2021-05-10 ENCOUNTER — Other Ambulatory Visit: Payer: Self-pay

## 2021-05-10 ENCOUNTER — Ambulatory Visit: Payer: Medicare PPO | Admitting: Urology

## 2021-05-10 ENCOUNTER — Encounter: Payer: Self-pay | Admitting: Urology

## 2021-05-10 VITALS — BP 123/67 | HR 89 | Ht 71.0 in | Wt 200.0 lb

## 2021-05-10 DIAGNOSIS — C679 Malignant neoplasm of bladder, unspecified: Secondary | ICD-10-CM

## 2021-05-10 DIAGNOSIS — D494 Neoplasm of unspecified behavior of bladder: Secondary | ICD-10-CM

## 2021-05-11 ENCOUNTER — Encounter: Payer: Self-pay | Admitting: Urology

## 2021-05-11 NOTE — Progress Notes (Signed)
   05/11/21  CC:  Chief Complaint  Patient presents with   Cysto    Urologic history: 1.  Ta urothelial carcinoma bladder, high-grade -TURBT 07/24/2020; ~ 2 cm right posterior wall tumor -Induction BCG completed 11/09/2020 which was tolerated well  HPI: Brandon Lewis has no complaints.  Denies gross hematuria.  Due to BCG shortage he was only able to receive 1 BCG dose 03/01/2022  Blood pressure 123/67, pulse 89, height '5\' 11"'$  (1.803 m), weight 200 lb (90.7 kg). NED. A&Ox3.   No respiratory distress   Abd soft, NT, ND Normal phallus with bilateral descended testicles  Cystoscopy Procedure Note  Patient identification was confirmed, informed consent was obtained, and patient was prepped using Betadine solution.  Lidocaine jelly was administered per urethral meatus.     Pre-Procedure: - Inspection reveals a normal caliber urethral meatus.  Procedure: The flexible cystoscope was introduced without difficulty - No urethral strictures/lesions are present. -Moderate lateral lobe enlargement prostate  -Mild elevation bladder neck - Bilateral ureteral orifices identified - Bladder mucosa  reveals no ulcers, tumors, or lesions.  Scar right posterior wall at previous tumor site without erythema - No bladder stones - No trabeculation  Retroflexion shows no abnormalities   Post-Procedure: - Patient tolerated the procedure well  Assessment/ Plan: No evidence recurrent urothelial carcinoma Will see if additional 2 courses of maintenance BCG can be scheduled Follow-up surveillance cystoscopy 3 months    Abbie Sons, MD

## 2021-05-13 LAB — URINALYSIS, COMPLETE
Bilirubin, UA: NEGATIVE
Ketones, UA: NEGATIVE
Leukocytes,UA: NEGATIVE
Nitrite, UA: NEGATIVE
RBC, UA: NEGATIVE
Specific Gravity, UA: >1.03 — ABNORMAL HIGH (ref 1.005–1.030)
Urobilinogen, Ur: 0.2 mg/dL (ref 0.2–1.0)
pH, UA: 5 (ref 5.0–7.5)

## 2021-05-13 LAB — MICROSCOPIC EXAMINATION

## 2021-05-14 NOTE — Progress Notes (Signed)
Subjective:    Patient ID: Brandon Lewis, male    DOB: 11/29/1933, 87 y.o.   MRN: [id number] Chief Complaint  Patient presents with   Follow-up    6 wk [place]    Brandon Lewis is an 87 year old male that returns today following intervention on 03/13/2021, including:   PROCEDURE: 1.         Redo left common femoral, superficial femoral and profunda femoris endarterectomy with Cormatrix patch angioplasty   Patient notes that the swelling that was previously in his groin is greatly decreased.  The wound has completely healed.  The incision is clean dry and intact.  There is no drainage.  The patient has begun to resume normal everyday activities.  Overall he continues to recover well.  Today noninvasive studies reveal a patent left femoral bypass graft.  The bypass graft has triphasic waveforms throughout.  No evidence of pseudoaneurysm.  Previous ABIs showed, Today noninvasive studies show an ABI of 0.70 on the right and 0.75 on the left.  The patient has monophasic/biphasic waveforms bilaterally.  The toe waveforms in the left are stronger than the right is slightly dampened.  Previous ABIs were 0.66 on the right and 0.59 on the left.  The patient does have known bilateral SFA occlusions.     Review of Systems  Cardiovascular:  Positive for leg swelling.  All other systems reviewed and are negative.     Objective:   Physical Exam Vitals reviewed.  HENT:     Head: Normocephalic.  Cardiovascular:     Rate and Rhythm: Normal rate.     Pulses: Normal pulses.  Pulmonary:     Effort: Pulmonary effort is normal.  Skin:    General: Skin is warm and dry.  Neurological:     Mental Status: He is alert and oriented to person, place, and time.     Gait: Gait abnormal.  Psychiatric:        Mood and Affect: Mood normal.        Behavior: Behavior normal.        Thought Content: Thought content normal.        Judgment: Judgment normal.    BP 130/78   Pulse 79   Ht '5\' 11"'$  (1.803  m)   Wt 201 lb (91.2 kg)   BMI 28.03 kg/m   Past Medical History:  Diagnosis Date   Anemia    h/o   Aortic atherosclerosis (HCC)    Bladder cancer (HCC)    still receiving treatments   CAD (coronary artery disease)    s/p PCI after presenting with exertional angina (drug eluting stent)   Chronic kidney disease    STAGE 3B   Diverticulosis    GERD (gastroesophageal reflux disease)    History of kidney stones    h/o   HTN (hypertension)    Hx of CABG 09/16/2019   4 vessel; LIMA-LAD, SVG-RCA, SVG-OM1, SVG-D2   Hyperlipidemia    Myocardial infarction ([place]) 09/2019   s/p CABG x 4   Osteoarthritis    PVD (peripheral vascular disease) (HCC)    s/p aortobifemoral bypass, with bilateral SFA occlusion and intermittent claudication    Small bowel obstruction (HCC)    after surgeries   Thoracic ascending aortic aneurysm (HCC)    Thyroid nodule     Social History   Socioeconomic History   Marital status: Married    Spouse name: Not on file   Number of children: 3   Years of education:  Not on file   Highest education level: Not on file  Occupational History   Occupation: Landscape architect    Comment: Retired  Tobacco Use   Smoking status: Former    Packs/day: 1.00    Years: 20.00    Pack years: 20.00    Types: Cigarettes    Quit date: 10/20/1992    Years since quitting: 28.5   Smokeless tobacco: Never   Tobacco comments:    quit in 1994   Vaping Use   Vaping Use: Never used  Substance and Sexual Activity   Alcohol use: Yes    Comment: rare   Drug use: No   Sexual activity: Not on file  Other Topics Concern   Not on file  Social History Narrative   Widowed; then remarried; 3 sons.      Has a living will - wife has health care POA    Son [name] would be alternate   Would want resuscitation attempts   Would accept brief trial of artificial nutrition      Pt signed designated party release form and gives Tsutomu Pelley [phone] (home #), access to medical  records. Can also leave msg on home answering machine. Cell # [phone]   Social Determinants of Health   Financial Resource Strain: Not on file  Food Insecurity: Not on file  Transportation Needs: Not on file  Physical Activity: Not on file  Stress: Not on file  Social Connections: Not on file  Intimate Partner Violence: Not on file    Past Surgical History:  Procedure Laterality Date   aorto-bifem  2003   [facility]    CAROTID STENT     [facility]   CATARACT EXTRACTION, BILATERAL  2017   CHOLECYSTECTOMY  2002   COLONOSCOPY     CORONARY ARTERY BYPASS GRAFT N/A 09/16/2019   Procedure: CORONARY ARTERY BYPASS GRAFTING (CABG) using LIMA to LAD; Endoscopic right saphenous vein harvest to OM1, Diag1, and RCA.;  Surgeon: Wonda Olds, MD;  Location: [place];  Service: Open Heart Surgery;  Laterality: N/A;   ENDARTERECTOMY FEMORAL Left 03/13/2021   Procedure: ENDARTERECTOMY FEMORAL (GROIN REVISION);  Surgeon: Katha Cabal, MD;  Location: [facility];  Service: Vascular;  Laterality: Left;   LEFT HEART CATH AND CORONARY ANGIOGRAPHY N/A 09/12/2019   Procedure: LEFT HEART CATH AND CORONARY ANGIOGRAPHY;  Surgeon: Corey Skains, MD;  Location: [place] [facility];  Service: Cardiovascular;  Laterality: N/A;   LITHOTRIPSY     LOWER EXTREMITY ANGIOGRAPHY Left 01/15/2021   Procedure: LOWER EXTREMITY ANGIOGRAPHY;  Surgeon: Katha Cabal, MD;  Location: [place] [facility];  Service: Cardiovascular;  Laterality: Left;   TEE WITHOUT CARDIOVERSION N/A 09/16/2019   Procedure: TRANSESOPHAGEAL ECHOCARDIOGRAM (TEE);  Surgeon: Wonda Olds, MD;  Location: [place];  Service: Open Heart Surgery;  Laterality: N/A;   TONSILLECTOMY     TRANSURETHRAL RESECTION OF BLADDER TUMOR WITH MITOMYCIN-C N/A 07/24/2020   Procedure: TRANSURETHRAL RESECTION OF BLADDER TUMOR WITH Gemcitabine;  Surgeon: Abbie Sons, MD;  Location: [facility];  Service: Urology;  Laterality: N/A;   WISDOM TOOTH EXTRACTION       Family History  Problem Relation Age of Onset   Diabetes Father        DM - and brother    Stroke Father    Coronary artery disease Paternal Grandfather    Colon cancer Neg Hx    Prostate cancer Neg Hx     Allergies  Allergen Reactions  Cilostazol Other (See Comments)    Bowel upset   Simvastatin Other (See Comments)    myalgia    CBC Latest Ref Rng & Units 03/25/2021 03/14/2021 03/13/2021  WBC 4.0 - 10.5 K/uL 11.0(H) 10.1 7.6  Hemoglobin 13.0 - 17.0 g/dL 8.9(L) 8.4(L) 9.4(L)  Hematocrit 39.0 - 52.0 % 28.3(L) 26.0(L) 28.5(L)  Platelets 150 - 400 K/uL 226 104(L) 121(L)      CMP     Component Value Date/Time   NA 137 03/25/2021 1349   K 4.3 03/25/2021 1349   CL 106 03/25/2021 1349   CO2 25 03/25/2021 1349   GLUCOSE 189 (H) 03/25/2021 1349   BUN 26 (H) 03/25/2021 1349   CREATININE 1.50 (H) 03/25/2021 1349   CALCIUM 9.3 03/25/2021 1349   PROT 7.0 10/16/2020 0634   ALBUMIN 3.2 (L) 10/16/2020 0634   AST 23 10/16/2020 0634   ALT 13 10/16/2020 0634   ALKPHOS 60 10/16/2020 0634   BILITOT 1.1 10/16/2020 0634   GFRNONAA 46 (L) 03/25/2021 1349   GFRAA 45 (L) 10/01/2019 0327     VAS [place] ABI WITH/WO TBI  Result Date: 04/01/2021  LOWER EXTREMITY DOPPLER STUDY Patient Name:  HILAL RAIMONDO  Date of Exam:   03/29/2021 Medical Rec #: [id number]           Accession #:    [id number] Date of Birth: [birth year]           Patient Gender: M Patient Age:   67Y Exam Location:  [facility] Procedure:      VAS [place] ABI WITH/WO TBI Referring Phys: [id number] [place] --------------------------------------------------------------------------------  Indications: Peripheral artery disease, and S/P Left Endartectomy.  Vascular Interventions: 01/15/2021: Abdominal Aortogram. Bilateral [place]                         Extremity distal Runoff. Placement catheter into the                         left Profunda Femoris from Right Femoral Approach. Comparison Study: 12/21/2020 Performing  Technologist: Almira Coaster RVS  Examination Guidelines: A complete evaluation includes at minimum, Doppler waveform signals and systolic blood pressure reading at the level of bilateral brachial, anterior tibial, and posterior tibial arteries, when vessel segments are accessible. Bilateral testing is considered an integral part of a complete examination. Photoelectric Plethysmograph (PPG) waveforms and toe systolic pressure readings are included as required and additional duplex testing as needed. Limited examinations for reoccurring indications may be performed as noted.  ABI Findings: +---------+------------------+-----+--------+--------+ Right    Rt Pressure (mmHg)IndexWaveformComment  +---------+------------------+-----+--------+--------+ Brachial 134                                     +---------+------------------+-----+--------+--------+ ATA      86                0.64                  +---------+------------------+-----+--------+--------+ PTA      94                0.70                  +---------+------------------+-----+--------+--------+ Great Toe82                0.61                  +---------+------------------+-----+--------+--------+ +---------+------------------+-----+--------+-------+  Left     Lt Pressure (mmHg)IndexWaveformComment +---------+------------------+-----+--------+-------+ Brachial 128                                    +---------+------------------+-----+--------+-------+ ATA      82                0.61                 +---------+------------------+-----+--------+-------+ PTA      100               0.75                 +---------+------------------+-----+--------+-------+ Great Toe62                0.46                 +---------+------------------+-----+--------+-------+ +-------+-----------+-----------+------------+------------+ ABI/TBIToday's ABIToday's TBIPrevious ABIPrevious TBI  +-------+-----------+-----------+------------+------------+ Right  .70        .61        .66         .45          +-------+-----------+-----------+------------+------------+ Left   .75        .46        .59         .34          +-------+-----------+-----------+------------+------------+  Bilateral TBIs appear increased compared to prior study on 12/21/2020. Bilateral ABIs appear increased compared to prior study on 12/21/2020.  Summary: Right: Resting right ankle-brachial index indicates moderate right lower extremity arterial disease. The right toe-brachial index is abnormal. Left: Resting left ankle-brachial index indicates moderate left lower extremity arterial disease. The left toe-brachial index is abnormal.  *See table(s) above for measurements and observations.  Electronically signed by Hortencia Pilar MD on 04/01/2021 at 4:33:19 PM.    Final        Assessment & Plan:   1. Atherosclerosis of artery of extremity with rest pain  [facility]) Patient is doing well post repair.  Noninvasive studies reveal the graft is open and patent.  Wound is healed and patient has been to resume his normal activities.  If patient experiences edema he is advised to utilize compression socks.  Otherwise we will have patient return in 3 months with noninvasive studies.  2. Essential hypertension, benign Continue antihypertensive medications as already ordered, these medications have been reviewed and there are no changes at this time.   3. Hyperlipidemia, unspecified hyperlipidemia type Continue statin as ordered and reviewed, no changes at this time    Current Outpatient Medications on File Prior to Visit  Medication Sig Dispense Refill   aspirin EC 81 MG tablet Take 81 mg by mouth in the morning. Swallow whole.     Cholecalciferol (VITAMIN D) 50 MCG (2000 UT) CAPS Take 2,000 Units by mouth in the morning.     clopidogrel (PLAVIX) 75 MG tablet *VIAL ONLY* TAKE ONE TABLET BY MOUTH EVERY DAY 90 tablet 3    HYDROcodone-acetaminophen (NORCO) 5-325 MG tablet Take 1-2 tablets by mouth every 6 (six) hours as needed for moderate pain or severe pain. 36 tablet 0   hydrocortisone (ANUSOL-HC) 2.5 % rectal cream Place 1 application rectally daily as needed for hemorrhoids or anal itching.     MAGNESIUM GLYCINATE PO Take 500 mg by mouth every evening.     metoprolol succinate (TOPROL-XL) 50 MG 24 hr tablet Take 1 tablet (50 mg  total) by mouth in the morning. 90 tablet 1   Multiple Vitamin (MULTIVITAMIN WITH MINERALS) TABS tablet Take 1 tablet by mouth daily. Centrum Silver     pantoprazole (PROTONIX) 40 MG tablet Take 40 mg by mouth daily as needed (indigestion/heartburn).     rosuvastatin (CRESTOR) 10 MG tablet Take 1 tablet (10 mg total) by mouth every evening. 90 tablet 1   sodium chloride (OCEAN) 0.65 % nasal spray Place 1 spray into the nose daily as needed (sinus congestion).     tamsulosin (FLOMAX) 0.4 MG CAPS capsule TAKE 1 CAPSULE BY MOUTH ONCE DAILY (Patient taking differently: Take 0.4 mg by mouth daily after breakfast.) 30 capsule 11   No current facility-administered medications on file prior to visit.    There are no Patient Instructions on file for this visit. No follow-ups on file.   Kris Hartmann, NP

## 2021-05-22 ENCOUNTER — Encounter: Payer: Self-pay | Admitting: Internal Medicine

## 2021-05-22 ENCOUNTER — Other Ambulatory Visit: Payer: Self-pay

## 2021-05-22 ENCOUNTER — Ambulatory Visit (INDEPENDENT_AMBULATORY_CARE_PROVIDER_SITE_OTHER): Payer: Medicare PPO | Admitting: Internal Medicine

## 2021-05-22 VITALS — BP 126/70 | HR 77 | Temp 97.6°F | Ht 70.0 in | Wt 204.0 lb

## 2021-05-22 DIAGNOSIS — I739 Peripheral vascular disease, unspecified: Secondary | ICD-10-CM | POA: Diagnosis not present

## 2021-05-22 DIAGNOSIS — I4892 Unspecified atrial flutter: Secondary | ICD-10-CM

## 2021-05-22 DIAGNOSIS — C679 Malignant neoplasm of bladder, unspecified: Secondary | ICD-10-CM | POA: Diagnosis not present

## 2021-05-22 DIAGNOSIS — I2510 Atherosclerotic heart disease of native coronary artery without angina pectoris: Secondary | ICD-10-CM

## 2021-05-22 DIAGNOSIS — D638 Anemia in other chronic diseases classified elsewhere: Secondary | ICD-10-CM

## 2021-05-22 DIAGNOSIS — N1831 Chronic kidney disease, stage 3a: Secondary | ICD-10-CM | POA: Diagnosis not present

## 2021-05-22 DIAGNOSIS — Z0000 Encounter for general adult medical examination without abnormal findings: Secondary | ICD-10-CM | POA: Diagnosis not present

## 2021-05-22 LAB — CBC
HCT: 33.3 % — ABNORMAL LOW (ref 39.0–52.0)
Hemoglobin: 10.9 g/dL — ABNORMAL LOW (ref 13.0–17.0)
MCHC: 32.8 g/dL (ref 30.0–36.0)
MCV: 116.9 fl — ABNORMAL HIGH (ref 78.0–100.0)
Platelets: 161 10*3/uL (ref 150.0–400.0)
RBC: 2.85 Mil/uL — ABNORMAL LOW (ref 4.22–5.81)
RDW: 17.6 % — ABNORMAL HIGH (ref 11.5–15.5)
WBC: 8.8 10*3/uL (ref 4.0–10.5)

## 2021-05-22 LAB — RENAL FUNCTION PANEL
Albumin: 3.8 g/dL (ref 3.5–5.2)
BUN: 19 mg/dL (ref 6–23)
CO2: 25 mEq/L (ref 19–32)
Calcium: 9.5 mg/dL (ref 8.4–10.5)
Chloride: 107 mEq/L (ref 96–112)
Creatinine, Ser: 1.4 mg/dL (ref 0.40–1.50)
GFR: 45.96 mL/min — ABNORMAL LOW (ref >60.00)
Glucose, Bld: 136 mg/dL — ABNORMAL HIGH (ref 70–99)
Phosphorus: 2.8 mg/dL (ref 2.3–4.6)
Potassium: 4.5 mEq/L (ref 3.5–5.1)
Sodium: 139 mEq/L (ref 135–145)

## 2021-05-22 MED ORDER — METRONIDAZOLE 0.75 % EX GEL: 1.0000 "application " | Freq: Two times a day (BID) | CUTANEOUS | 11 refills | Status: DC

## 2021-05-22 NOTE — Progress Notes (Signed)
Subjective:    Patient ID: Brandon Lewis, male    DOB: Sep 26, 1934, 87 y.o.   MRN: [id number]  HPI Here for Medicare wellness visit and follow up of chronic health conditions This visit occurred during the SARS-CoV-2 public health emergency.  Safety protocols were in place, including screening questions prior to the visit, additional usage of staff PPE, and extensive cleaning of exam room while observing appropriate contact time as indicated for disinfecting solutions.   Reviewed form and advanced directives Reviewed other doctors Will have a few alcohol drinks per week No tobacco Tries to use nu-step machine regularly No falls No depression or anhedonia Vision is pretty good Hearing is still poor despite his hearing aides Independent with instrumental ADLs---does have helper No sig memory problems  Still has some leg swelling since the vascular surgery No dizziness  Still gets some leg pain ("claudication") with walking--but it is better since the surgery Overall energy levels are better  Has concerns about his rosacea Has prominence in nose/forehead  No chest pain No SOB No palpitations--only had one episode of atrial flutter No sig ankle edema Only mild transient dizziness of late---no syncope  Last GFR 46 on labs Still anemic but hemoglobin stable  Voids okay on tamsulosin Off the BCG due to shortage--hoping to get it restarted  Current Outpatient Medications on File Prior to Visit  Medication Sig Dispense Refill   aspirin EC 81 MG tablet Take 81 mg by mouth in the morning. Swallow whole.     Cholecalciferol (VITAMIN D) 50 MCG (2000 UT) CAPS Take 2,000 Units by mouth in the morning.     clopidogrel (PLAVIX) 75 MG tablet *VIAL ONLY* TAKE ONE TABLET BY MOUTH EVERY DAY 90 tablet 3   hydrocortisone (ANUSOL-HC) 2.5 % rectal cream Place 1 application rectally daily as needed for hemorrhoids or anal itching.     MAGNESIUM GLYCINATE PO Take 500 mg by mouth every  evening.     metoprolol succinate (TOPROL-XL) 50 MG 24 hr tablet Take 1 tablet (50 mg total) by mouth in the morning. 90 tablet 1   Multiple Vitamin (MULTIVITAMIN WITH MINERALS) TABS tablet Take 1 tablet by mouth daily. Centrum Silver     pantoprazole (PROTONIX) 40 MG tablet Take 40 mg by mouth daily as needed (indigestion/heartburn).     rosuvastatin (CRESTOR) 10 MG tablet Take 1 tablet (10 mg total) by mouth every evening. 90 tablet 1   sodium chloride (OCEAN) 0.65 % nasal spray Place 1 spray into the nose daily as needed (sinus congestion).     tamsulosin (FLOMAX) 0.4 MG CAPS capsule TAKE 1 CAPSULE BY MOUTH ONCE DAILY (Patient taking differently: Take 0.4 mg by mouth daily after breakfast.) 30 capsule 11   No current facility-administered medications on file prior to visit.    Allergies  Allergen Reactions   Cilostazol Other (See Comments)    Bowel upset   Simvastatin Other (See Comments)    myalgia    Past Medical History:  Diagnosis Date   Anemia    h/o   Aortic atherosclerosis (HCC)    Bladder cancer ([place])    still receiving treatments   CAD (coronary artery disease)    s/p PCI after presenting with exertional angina (drug eluting stent)   Chronic kidney disease    STAGE 3B   Diverticulosis    GERD (gastroesophageal reflux disease)    History of kidney stones    h/o   HTN (hypertension)    Hx of CABG 09/16/2019  4 vessel; LIMA-LAD, SVG-RCA, SVG-OM1, SVG-D2   Hyperlipidemia    Myocardial infarction ([place]) 09/2019   s/p CABG x 4   Osteoarthritis    PVD (peripheral vascular disease) (HCC)    s/p aortobifemoral bypass, with bilateral SFA occlusion and intermittent claudication    Small bowel obstruction (HCC)    after surgeries   Thoracic ascending aortic aneurysm [facility])    Thyroid nodule     Past Surgical History:  Procedure Laterality Date   aorto-bifem  2003   [facility]    CAROTID STENT     [place]   CATARACT EXTRACTION, BILATERAL  2017   CHOLECYSTECTOMY  2002    COLONOSCOPY     CORONARY ARTERY BYPASS GRAFT N/A 09/16/2019   Procedure: CORONARY ARTERY BYPASS GRAFTING (CABG) using LIMA to LAD; Endoscopic right saphenous vein harvest to OM1, Diag1, and RCA.;  Surgeon: Wonda Olds, MD;  Location: [place];  Service: Open Heart Surgery;  Laterality: N/A;   ENDARTERECTOMY FEMORAL Left 03/13/2021   Procedure: ENDARTERECTOMY FEMORAL (GROIN REVISION);  Surgeon: Katha Cabal, MD;  Location: [facility];  Service: Vascular;  Laterality: Left;   LEFT HEART CATH AND CORONARY ANGIOGRAPHY N/A 09/12/2019   Procedure: LEFT HEART CATH AND CORONARY ANGIOGRAPHY;  Surgeon: Corey Skains, MD;  Location: [place] [facility];  Service: Cardiovascular;  Laterality: N/A;   LITHOTRIPSY     LOWER EXTREMITY ANGIOGRAPHY Left 01/15/2021   Procedure: LOWER EXTREMITY ANGIOGRAPHY;  Surgeon: Katha Cabal, MD;  Location: [place] [facility];  Service: Cardiovascular;  Laterality: Left;   TEE WITHOUT CARDIOVERSION N/A 09/16/2019   Procedure: TRANSESOPHAGEAL ECHOCARDIOGRAM (TEE);  Surgeon: Wonda Olds, MD;  Location: [place];  Service: Open Heart Surgery;  Laterality: N/A;   TONSILLECTOMY     TRANSURETHRAL RESECTION OF BLADDER TUMOR WITH MITOMYCIN-C N/A 07/24/2020   Procedure: TRANSURETHRAL RESECTION OF BLADDER TUMOR WITH Gemcitabine;  Surgeon: Abbie Sons, MD;  Location: [facility];  Service: Urology;  Laterality: N/A;   WISDOM TOOTH EXTRACTION      Family History  Problem Relation Age of Onset   Diabetes Father        DM - and brother    Stroke Father    Coronary artery disease Paternal Grandfather    Colon cancer Neg Hx    Prostate cancer Neg Hx     Social History   Socioeconomic History   Marital status: Married    Spouse name: Not on file   Number of children: 3   Years of education: Not on file   Highest education level: Not on file  Occupational History   Occupation: Landscape architect    Comment: Retired  Tobacco Use   Smoking status:  Former    Packs/day: 1.00    Years: 20.00    Pack years: 20.00    Types: Cigarettes    Quit date: 10/20/1992    Years since quitting: 28.6   Smokeless tobacco: Never   Tobacco comments:    quit in 1994   Vaping Use   Vaping Use: Never used  Substance and Sexual Activity   Alcohol use: Yes    Comment: rare   Drug use: No   Sexual activity: Not on file  Other Topics Concern   Not on file  Social History Narrative   Widowed; then remarried; 3 sons.      Has a living will - wife has health care POA    Son [name] would be alternate   Would want  resuscitation attempts   Would accept brief trial of artificial nutrition      Pt signed designated party release form and gives Adam Rentschler [phone] (home #), access to medical records. Can also leave msg on home answering machine. Cell # [phone]   Social Determinants of Health   Financial Resource Strain: Not on file  Food Insecurity: Not on file  Transportation Needs: Not on file  Physical Activity: Not on file  Stress: Not on file  Social Connections: Not on file  Intimate Partner Violence: Not on file   Review of Systems Appetite  is fine Weight stable Sleeps well Wears seat belt Teeth are okay---sees dentist Has hand stiffness--wonders about tumeric (discussed--okay to start) Occasional heartburn--uses the protonix prn (no dysphagia) Bowels are regular--no blood No other skin issues No other significant joint pains     Objective:   Physical Exam Constitutional:      Appearance: Normal appearance.  HENT:     Mouth/Throat:     Comments: No lesions Eyes:     Conjunctiva/sclera: Conjunctivae normal.     Pupils: Pupils are equal, round, and reactive to light.  Cardiovascular:     Rate and Rhythm: Normal rate and regular rhythm.     Heart sounds: No murmur heard.   No gallop.     Comments: Faint pedal pulses Pulmonary:     Effort: Pulmonary effort is normal.     Breath sounds: Normal breath sounds. No wheezing  or rales.  Abdominal:     Palpations: Abdomen is soft.     Tenderness: There is no abdominal tenderness.  Musculoskeletal:     Cervical back: Neck supple.  Lymphadenopathy:     Cervical: No cervical adenopathy.  Skin:    Comments: Rosacea changes in face  Neurological:     Mental Status: He is alert and oriented to person, place, and time.     Comments: President--- "Zoila Shutter, Obama" [id number] D-l-r-[id number]-w Recall 3/3  Psychiatric:        Mood and Affect: Mood normal.        Behavior: Behavior normal.           Assessment & Plan:

## 2021-05-22 NOTE — Assessment & Plan Note (Signed)
Last GFR 46 Will recheck

## 2021-05-22 NOTE — Assessment & Plan Note (Signed)
TURBT last November Still under BCG protocol--but hasn't gotten lately due to shortage

## 2021-05-22 NOTE — Assessment & Plan Note (Signed)
No recurrence since the surgery

## 2021-05-22 NOTE — Assessment & Plan Note (Signed)
Has done well since CABG Is on statin, metoprolol, ASA

## 2021-05-22 NOTE — Assessment & Plan Note (Signed)
Claudication less noticeable since his surgery On ASA, plavix, statin

## 2021-05-22 NOTE — Assessment & Plan Note (Signed)
I have personally reviewed the Medicare Annual Wellness questionnaire and have noted 1. The patient's medical and social history 2. Their use of alcohol, tobacco or illicit drugs 3. Their current medications and supplements 4. The patient's functional ability including ADL's, fall risks, home safety risks and hearing or visual             impairment. 5. Diet and physical activities 6. Evidence for depression or mood disorders  The patients weight, height, BMI and visual acuity have been recorded in the chart I have made referrals, counseling and provided education to the patient based review of the above and I have provided the pt with a written personalized care plan for preventive services.  I have provided you with a copy of your personalized plan for preventive services. Please take the time to review along with your updated medication list.  No cancer screening due to age Will get second COVID booster soon Flu vaccine in the fall Td if any injury He will get shingrix at the pharmacy

## 2021-05-22 NOTE — Assessment & Plan Note (Signed)
Hemoglobin low since CABG Will check FIT just in case

## 2021-05-22 NOTE — Progress Notes (Signed)
Vision Screening   Right eye Left eye Both eyes  Without correction     With correction '20/40 20/40 20/40 '$  Hearing Screening - Comments:: Has hearing aids. Wearing them today.

## 2021-05-24 ENCOUNTER — Telehealth: Payer: Self-pay | Admitting: *Deleted

## 2021-05-24 ENCOUNTER — Telehealth: Payer: Self-pay | Admitting: Radiology

## 2021-05-24 ENCOUNTER — Other Ambulatory Visit (INDEPENDENT_AMBULATORY_CARE_PROVIDER_SITE_OTHER): Payer: Medicare PPO

## 2021-05-24 DIAGNOSIS — D638 Anemia in other chronic diseases classified elsewhere: Secondary | ICD-10-CM | POA: Diagnosis not present

## 2021-05-24 DIAGNOSIS — R195 Other fecal abnormalities: Secondary | ICD-10-CM

## 2021-05-24 DIAGNOSIS — N1831 Chronic kidney disease, stage 3a: Secondary | ICD-10-CM

## 2021-05-24 LAB — FECAL OCCULT BLOOD, IMMUNOCHEMICAL: Fecal Occult Bld: POSITIVE — AB

## 2021-05-24 NOTE — Telephone Encounter (Signed)
[facility] called a critical result, POSITIVE ifob.  Results sent to Dr Silvio Pate, verbally gave result to Dr Glori Bickers.

## 2021-05-24 NOTE — Telephone Encounter (Signed)
Called patient and discussed BCG maintenance treatments. Patient was scheduled and reminder instructions were sent via my chart

## 2021-05-24 NOTE — Telephone Encounter (Signed)
Pt calling to see if he will be able to get BCG? Please advise   Assessment/ Plan: No evidence recurrent urothelial carcinoma Will see if additional 2 courses of maintenance BCG can be scheduled Follow-up surveillance cystoscopy 3 months

## 2021-05-25 NOTE — Telephone Encounter (Signed)
GI referral placed. I notified him on MyChart with the test result

## 2021-05-27 ENCOUNTER — Ambulatory Visit (INDEPENDENT_AMBULATORY_CARE_PROVIDER_SITE_OTHER): Payer: Medicare PPO | Admitting: Physician Assistant

## 2021-05-27 ENCOUNTER — Other Ambulatory Visit: Payer: Self-pay

## 2021-05-27 DIAGNOSIS — D494 Neoplasm of unspecified behavior of bladder: Secondary | ICD-10-CM | POA: Diagnosis not present

## 2021-05-27 MED ORDER — BCG LIVE 50 MG IS SUSR
3.2400 mL | Freq: Once | INTRAVESICAL | Status: AC
  Administered 2021-05-27: 81 mg via INTRAVESICAL

## 2021-05-27 NOTE — Progress Notes (Signed)
BCG Bladder Instillation  BCG # 1 of 3  Due to Bladder Cancer patient is present today for a BCG treatment. Patient was cleaned and prepped in a sterile fashion with betadine. A 14FR catheter was inserted, urine return was noted 5m, urine was yellow in color.  583mof reconstituted BCG was instilled into the bladder. The catheter was then removed. Patient tolerated well, no complications were noted  Performed by: SaDebroah LoopPA-C and CrBradly BienenstockCMA  Follow up/ Additional notes: 1 week for BCG #2 of 3

## 2021-05-28 LAB — MICROSCOPIC EXAMINATION
Bacteria, UA: NONE SEEN
Epithelial Cells (non renal): NONE SEEN /hpf (ref 0–10)

## 2021-05-28 LAB — URINALYSIS, COMPLETE
Bilirubin, UA: NEGATIVE
Glucose, UA: NEGATIVE
Ketones, UA: NEGATIVE
Leukocytes,UA: NEGATIVE
Nitrite, UA: NEGATIVE
RBC, UA: NEGATIVE
Specific Gravity, UA: >1.03 — ABNORMAL HIGH (ref 1.005–1.030)
Urobilinogen, Ur: 0.2 mg/dL (ref 0.2–1.0)
pH, UA: 5 (ref 5.0–7.5)

## 2021-05-31 ENCOUNTER — Ambulatory Visit: Payer: Medicare PPO | Admitting: Physician Assistant

## 2021-06-03 ENCOUNTER — Ambulatory Visit: Payer: Medicare PPO | Admitting: Physician Assistant

## 2021-06-03 ENCOUNTER — Other Ambulatory Visit: Payer: Self-pay

## 2021-06-03 ENCOUNTER — Encounter: Payer: Self-pay | Admitting: Physician Assistant

## 2021-06-03 VITALS — BP 121/74 | HR 74 | Ht 70.0 in | Wt 204.0 lb

## 2021-06-03 DIAGNOSIS — D494 Neoplasm of unspecified behavior of bladder: Secondary | ICD-10-CM

## 2021-06-03 DIAGNOSIS — C679 Malignant neoplasm of bladder, unspecified: Secondary | ICD-10-CM

## 2021-06-03 DIAGNOSIS — R310 Gross hematuria: Secondary | ICD-10-CM | POA: Diagnosis not present

## 2021-06-03 LAB — URINALYSIS, COMPLETE
Bilirubin, UA: NEGATIVE
Ketones, UA: NEGATIVE
Leukocytes,UA: NEGATIVE
Nitrite, UA: NEGATIVE
RBC, UA: NEGATIVE
Specific Gravity, UA: 1.025 (ref 1.005–1.030)
Urobilinogen, Ur: 0.2 mg/dL (ref 0.2–1.0)
pH, UA: 5.5 (ref 5.0–7.5)

## 2021-06-03 LAB — MICROSCOPIC EXAMINATION

## 2021-06-03 MED ORDER — BCG LIVE 50 MG IS SUSR
3.2400 mL | Freq: Once | INTRAVESICAL | Status: AC
  Administered 2021-06-03: 81 mg via INTRAVESICAL

## 2021-06-03 NOTE — Progress Notes (Signed)
BCG Bladder Instillation  BCG # 2 of 3  Due to Bladder Cancer patient is present today for a BCG treatment. Patient was cleaned and prepped in a sterile fashion with betadine. A 14FR coude catheter was inserted, urine return was noted 37m, urine was yellow in color.  549mof reconstituted BCG was instilled into the bladder. The catheter was then removed. Patient tolerated well, no complications were noted  Performed by: SaDebroah LoopPA-C   Follow up/ Additional notes: 1 week for BCG #3 of 3

## 2021-06-10 ENCOUNTER — Ambulatory Visit: Payer: Medicare PPO | Admitting: Family Medicine

## 2021-06-10 ENCOUNTER — Other Ambulatory Visit: Payer: Self-pay

## 2021-06-10 DIAGNOSIS — R310 Gross hematuria: Secondary | ICD-10-CM

## 2021-06-10 DIAGNOSIS — C679 Malignant neoplasm of bladder, unspecified: Secondary | ICD-10-CM

## 2021-06-10 LAB — URINALYSIS, COMPLETE
Bilirubin, UA: NEGATIVE
Glucose, UA: NEGATIVE
Ketones, UA: NEGATIVE
Leukocytes,UA: NEGATIVE
Nitrite, UA: NEGATIVE
RBC, UA: NEGATIVE
Specific Gravity, UA: 1.025 (ref 1.005–1.030)
Urobilinogen, Ur: 0.2 mg/dL (ref 0.2–1.0)
pH, UA: 5 (ref 5.0–7.5)

## 2021-06-10 LAB — MICROSCOPIC EXAMINATION
Bacteria, UA: NONE SEEN
RBC, Urine: NONE SEEN /hpf (ref 0–2)

## 2021-06-10 MED ORDER — BCG LIVE 50 MG IS SUSR
3.2400 mL | Freq: Once | INTRAVESICAL | Status: AC
  Administered 2021-06-10: 81 mg via INTRAVESICAL

## 2021-06-10 NOTE — Progress Notes (Signed)
BCG Bladder Instillation  BCG # 3 of 3  Due to Bladder Cancer patient is present today for a BCG treatment. Patient was cleaned and prepped in a sterile fashion with betadine. A 14FR catheter was inserted, urine return was noted 110m, urine was yellow in color.  543mof reconstituted BCG was instilled into the bladder. The catheter was then removed. Patient tolerated well, no complications were noted  Preformed by: CaElberta LeatherwoodCMA  Follow up/ Additional notes: follow up as scheduled

## 2021-06-11 ENCOUNTER — Telehealth: Payer: Self-pay

## 2021-06-11 ENCOUNTER — Other Ambulatory Visit (INDEPENDENT_AMBULATORY_CARE_PROVIDER_SITE_OTHER): Payer: Self-pay

## 2021-06-11 DIAGNOSIS — Z1211 Encounter for screening for malignant neoplasm of colon: Secondary | ICD-10-CM

## 2021-06-11 MED ORDER — NA SULFATE-K SULFATE-MG SULF 17.5-3.13-1.6 GM/177ML PO SOLN: 1.0000 | Freq: Once | ORAL | 0 refills | Status: AC

## 2021-06-11 NOTE — Telephone Encounter (Signed)
Called patient no answer left voicemail for a call back

## 2021-06-11 NOTE — Progress Notes (Signed)
Gastroenterology Pre-Procedure Review  Request Date: 07/17/2021 Requesting Physician: Dr. Vicente Males  PATIENT REVIEW QUESTIONS: The patient responded to the following health history questions as indicated:    1. Are you having any GI issues? no 2. Do you have a personal history of Polyps? yes (SEVERAL YEARS AGO) 3. Do you have a family history of Colon Cancer or Polyps? no 4. Diabetes Mellitus? no 5. Joint replacements in the past 12 months?no 6. Major health problems in the past 3 months?yes ([place]) 7. Any artificial heart valves, MVP, or defibrillator?no    MEDICATIONS & ALLERGIES:    Patient reports the following regarding taking any anticoagulation/antiplatelet therapy:   Plavix, Coumadin, Eliquis, Xarelto, Lovenox, Pradaxa, Brilinta, or Effient? yes (PLAVIX) Aspirin? yes (81 MG)  Patient confirms/reports the following medications:  Current Outpatient Medications  Medication Sig Dispense Refill   aspirin EC 81 MG tablet Take 81 mg by mouth in the morning. Swallow whole.     Cholecalciferol (VITAMIN D) 50 MCG (2000 UT) CAPS Take 2,000 Units by mouth in the morning.     clopidogrel (PLAVIX) 75 MG tablet *VIAL ONLY* TAKE ONE TABLET BY MOUTH EVERY DAY 90 tablet 3   hydrocortisone (ANUSOL-HC) 2.5 % rectal cream Place 1 application rectally daily as needed for hemorrhoids or anal itching.     MAGNESIUM GLYCINATE PO Take 500 mg by mouth every evening.     metoprolol succinate (TOPROL-XL) 50 MG 24 hr tablet Take 1 tablet (50 mg total) by mouth in the morning. 90 tablet 1   metroNIDAZOLE (METROGEL) 0.75 % gel Apply 1 application topically 2 (two) times daily. 45 g 11   Multiple Vitamin (MULTIVITAMIN WITH MINERALS) TABS tablet Take 1 tablet by mouth daily. Centrum Silver     pantoprazole (PROTONIX) 40 MG tablet Take 40 mg by mouth daily as needed (indigestion/heartburn).     rosuvastatin (CRESTOR) 10 MG tablet Take 1 tablet (10 mg total) by mouth every evening. 90 tablet 1   sodium chloride  (OCEAN) 0.65 % nasal spray Place 1 spray into the nose daily as needed (sinus congestion).     tamsulosin (FLOMAX) 0.4 MG CAPS capsule TAKE 1 CAPSULE BY MOUTH ONCE DAILY (Patient taking differently: Take 0.4 mg by mouth daily after breakfast.) 30 capsule 11   No current facility-administered medications for this visit.    Patient confirms/reports the following allergies:  Allergies  Allergen Reactions   Cilostazol Other (See Comments)    Bowel upset   Simvastatin Other (See Comments)    myalgia    No orders of the defined types were placed in this encounter.   AUTHORIZATION INFORMATION Primary Insurance: 1D#: Group #:  Secondary Insurance: 1D#: Group #:  SCHEDULE INFORMATION: Date: 07/17/2021 Time: Location:[facility]

## 2021-06-18 ENCOUNTER — Telehealth: Payer: Self-pay

## 2021-06-18 NOTE — Telephone Encounter (Signed)
Called patient to explain blood thinner clearance to stop his blood thinner 4 days before procedure patient understood

## 2021-07-16 ENCOUNTER — Encounter: Payer: Self-pay | Admitting: Gastroenterology

## 2021-07-17 ENCOUNTER — Ambulatory Visit
Admission: RE | Admit: 2021-07-17 | Discharge: 2021-07-17 | Disposition: A | Discharge status: Home / Self Care | Payer: Medicare PPO | Attending: Gastroenterology | Admitting: Gastroenterology

## 2021-07-17 ENCOUNTER — Ambulatory Visit: Payer: Medicare PPO | Admitting: Certified Registered Nurse Anesthetist

## 2021-07-17 ENCOUNTER — Encounter
Admission: RE | Disposition: A | Discharge status: Home / Self Care | Payer: Self-pay | Source: Home / Self Care | Attending: Gastroenterology

## 2021-07-17 DIAGNOSIS — Z79899 Other long term (current) drug therapy: Secondary | ICD-10-CM | POA: Diagnosis not present

## 2021-07-17 DIAGNOSIS — D122 Benign neoplasm of ascending colon: Secondary | ICD-10-CM | POA: Insufficient documentation

## 2021-07-17 DIAGNOSIS — I252 Old myocardial infarction: Secondary | ICD-10-CM | POA: Insufficient documentation

## 2021-07-17 DIAGNOSIS — Z833 Family history of diabetes mellitus: Secondary | ICD-10-CM | POA: Diagnosis not present

## 2021-07-17 DIAGNOSIS — Z7982 Long term (current) use of aspirin: Secondary | ICD-10-CM | POA: Insufficient documentation

## 2021-07-17 DIAGNOSIS — I739 Peripheral vascular disease, unspecified: Secondary | ICD-10-CM | POA: Diagnosis not present

## 2021-07-17 DIAGNOSIS — D124 Benign neoplasm of descending colon: Secondary | ICD-10-CM | POA: Diagnosis not present

## 2021-07-17 DIAGNOSIS — N1832 Chronic kidney disease, stage 3b: Secondary | ICD-10-CM | POA: Diagnosis not present

## 2021-07-17 DIAGNOSIS — I712 Thoracic aortic aneurysm, without rupture: Secondary | ICD-10-CM | POA: Insufficient documentation

## 2021-07-17 DIAGNOSIS — Z951 Presence of aortocoronary bypass graft: Secondary | ICD-10-CM | POA: Diagnosis not present

## 2021-07-17 DIAGNOSIS — I2510 Atherosclerotic heart disease of native coronary artery without angina pectoris: Secondary | ICD-10-CM | POA: Insufficient documentation

## 2021-07-17 DIAGNOSIS — I129 Hypertensive chronic kidney disease with stage 1 through stage 4 chronic kidney disease, or unspecified chronic kidney disease: Secondary | ICD-10-CM | POA: Insufficient documentation

## 2021-07-17 DIAGNOSIS — K5730 Diverticulosis of large intestine without perforation or abscess without bleeding: Secondary | ICD-10-CM | POA: Diagnosis not present

## 2021-07-17 DIAGNOSIS — D123 Benign neoplasm of transverse colon: Secondary | ICD-10-CM | POA: Insufficient documentation

## 2021-07-17 DIAGNOSIS — Z9049 Acquired absence of other specified parts of digestive tract: Secondary | ICD-10-CM | POA: Diagnosis not present

## 2021-07-17 DIAGNOSIS — Z1211 Encounter for screening for malignant neoplasm of colon: Secondary | ICD-10-CM | POA: Diagnosis not present

## 2021-07-17 DIAGNOSIS — Z87891 Personal history of nicotine dependence: Secondary | ICD-10-CM | POA: Diagnosis not present

## 2021-07-17 DIAGNOSIS — K5790 Diverticulosis of intestine, part unspecified, without perforation or abscess without bleeding: Secondary | ICD-10-CM | POA: Diagnosis not present

## 2021-07-17 DIAGNOSIS — Z8551 Personal history of malignant neoplasm of bladder: Secondary | ICD-10-CM | POA: Diagnosis not present

## 2021-07-17 DIAGNOSIS — Z888 Allergy status to other drugs, medicaments and biological substances status: Secondary | ICD-10-CM | POA: Diagnosis not present

## 2021-07-17 DIAGNOSIS — K635 Polyp of colon: Secondary | ICD-10-CM | POA: Diagnosis not present

## 2021-07-17 DIAGNOSIS — Z8249 Family history of ischemic heart disease and other diseases of the circulatory system: Secondary | ICD-10-CM | POA: Insufficient documentation

## 2021-07-17 DIAGNOSIS — E785 Hyperlipidemia, unspecified: Secondary | ICD-10-CM | POA: Insufficient documentation

## 2021-07-17 HISTORY — PX: COLONOSCOPY WITH PROPOFOL: SHX5780

## 2021-07-17 SURGERY — COLONOSCOPY WITH PROPOFOL
Anesthesia: General

## 2021-07-17 MED ORDER — LIDOCAINE HCL (PF) 1 % IJ SOLN
INTRAMUSCULAR | Status: AC
  Filled 2021-07-17: qty 2

## 2021-07-17 MED ORDER — SODIUM CHLORIDE 0.9 % IV SOLN
INTRAVENOUS | Status: DC
  Administered 2021-07-17: 1000 mL via INTRAVENOUS

## 2021-07-17 MED ORDER — LIDOCAINE HCL (CARDIAC) PF 100 MG/5ML IV SOSY
PREFILLED_SYRINGE | INTRAVENOUS | Status: DC | PRN
  Administered 2021-07-17: 50 mg via INTRAVENOUS

## 2021-07-17 MED ORDER — PROPOFOL 500 MG/50ML IV EMUL
INTRAVENOUS | Status: AC
  Filled 2021-07-17: qty 50

## 2021-07-17 MED ORDER — PROPOFOL 500 MG/50ML IV EMUL
INTRAVENOUS | Status: DC | PRN
  Administered 2021-07-17: 125 ug/kg/min via INTRAVENOUS

## 2021-07-17 MED ORDER — PHENYLEPHRINE HCL (PRESSORS) 10 MG/ML IV SOLN
INTRAVENOUS | Status: DC | PRN
  Administered 2021-07-17: 100 ug via INTRAVENOUS

## 2021-07-17 MED ORDER — PROPOFOL 10 MG/ML IV BOLUS
INTRAVENOUS | Status: AC
  Filled 2021-07-17: qty 20

## 2021-07-17 MED ORDER — PROPOFOL 10 MG/ML IV BOLUS
INTRAVENOUS | Status: DC | PRN
  Administered 2021-07-17: 40 mg via INTRAVENOUS

## 2021-07-17 NOTE — Anesthesia Preprocedure Evaluation (Signed)
Anesthesia Evaluation  Patient identified by MRN, date of birth, ID band Patient awake    Reviewed: Allergy & Precautions, NPO status , Patient's Chart, lab work & pertinent test results  History of Anesthesia Complications Negative for: history of anesthetic complications  Airway Mallampati: II  TM Distance: >3 FB Neck ROM: Full    Dental  (+) Poor Dentition   Pulmonary neg sleep apnea, neg COPD, former smoker,    breath sounds clear to auscultation- rhonchi (-) wheezing      Cardiovascular hypertension, Pt. on medications + CAD, + Past MI, + Cardiac Stents, + CABG and + Peripheral Vascular Disease   Rhythm:Regular Rate:Normal - Systolic murmurs and - Diastolic murmurs    Neuro/Psych neg Seizures negative neurological ROS  negative psych ROS   GI/Hepatic Neg liver ROS, GERD  ,  Endo/Other  negative endocrine ROSneg diabetes  Renal/GU Renal InsufficiencyRenal disease     Musculoskeletal  (+) Arthritis ,   Abdominal (+) - obese,   Peds  Hematology  (+) anemia ,   Anesthesia Other Findings Past Medical History: No date: Anemia     Comment:  h/o No date: Aortic atherosclerosis (HCC) No date: Bladder cancer ([place])     Comment:  still receiving treatments No date: CAD (coronary artery disease)     Comment:  s/p PCI after presenting with exertional angina (drug               eluting stent) No date: Chronic kidney disease     Comment:  STAGE 3B No date: Diverticulosis No date: GERD (gastroesophageal reflux disease) No date: History of kidney stones     Comment:  h/o No date: HTN (hypertension) 09/16/2019: Hx of CABG     Comment:  4 vessel; LIMA-LAD, SVG-RCA, SVG-OM1, SVG-D2 No date: Hyperlipidemia 09/2019: Myocardial infarction ([place])     Comment:  s/p CABG x 4 No date: Osteoarthritis No date: PVD (peripheral vascular disease) (HCC)     Comment:  s/p aortobifemoral bypass, with bilateral SFA occlusion                and intermittent claudication  No date: Small bowel obstruction (HCC)     Comment:  after surgeries No date: Thoracic ascending aortic aneurysm (HCC) No date: Thyroid nodule   Reproductive/Obstetrics                             Anesthesia Physical Anesthesia Plan  ASA: 3  Anesthesia Plan: General   Post-op Pain Management:    Induction: Intravenous  PONV Risk Score and Plan: 1 and Propofol infusion  Airway Management Planned: Natural Airway  Additional Equipment:   Intra-op Plan:   Post-operative Plan:   Informed Consent: I have reviewed the patients History and Physical, chart, labs and discussed the procedure including the risks, benefits and alternatives for the proposed anesthesia with the patient or authorized representative who has indicated his/her understanding and acceptance.     Dental advisory given  Plan Discussed with: CRNA and Anesthesiologist  Anesthesia Plan Comments:         Anesthesia Quick Evaluation

## 2021-07-17 NOTE — H&P (Signed)
Brandon Bellows, MD [address] [place], [place], [place], [address] [address], [place], [place], [place], [address] Phone: [phone]  Fax: [phone]  Primary Care Physician:  Brandon Carbon, MD   Pre-Procedure History & Physical: HPI:  Brandon Lewis is a 87 y.o. male is here for an colonoscopy.   Past Medical History:  Diagnosis Date   Anemia    h/o   Aortic atherosclerosis ([place])    Bladder cancer ([place])    still receiving treatments   CAD (coronary artery disease)    s/p PCI after presenting with exertional angina (drug eluting stent)   Chronic kidney disease    STAGE 3B   Diverticulosis    GERD (gastroesophageal reflux disease)    History of kidney stones    h/o   HTN (hypertension)    Hx of CABG 09/16/2019   4 vessel; LIMA-LAD, SVG-RCA, SVG-OM1, SVG-D2   Hyperlipidemia    Myocardial infarction ([facility]) 09/2019   s/p CABG x 4   Osteoarthritis    PVD (peripheral vascular disease) ([place])    s/p aortobifemoral bypass, with bilateral SFA occlusion and intermittent claudication    Small bowel obstruction ([facility])    after surgeries   Thoracic ascending aortic aneurysm ([place])    Thyroid nodule     Past Surgical History:  Procedure Laterality Date   aorto-bifem  2003   [facility]    CAROTID STENT     [facility]   CATARACT EXTRACTION, BILATERAL  2017   CHOLECYSTECTOMY  2002   COLONOSCOPY     CORONARY ARTERY BYPASS GRAFT N/A 09/16/2019   Procedure: CORONARY ARTERY BYPASS GRAFTING (CABG) using LIMA to LAD; Endoscopic right saphenous vein harvest to OM1, Diag1, and RCA.;  Surgeon: Brandon Olds, MD;  Location: [facility];  Service: Open Heart Surgery;  Laterality: N/A;   ENDARTERECTOMY FEMORAL Left 03/13/2021   Procedure: ENDARTERECTOMY FEMORAL (GROIN REVISION);  Surgeon: Brandon Cabal, MD;  Location: [facility];  Service: Vascular;  Laterality: Left;   LEFT HEART CATH AND CORONARY ANGIOGRAPHY N/A 09/12/2019   Procedure: LEFT HEART CATH AND CORONARY  ANGIOGRAPHY;  Surgeon: Brandon Skains, MD;  Location: [facility];  Service: Cardiovascular;  Laterality: N/A;   LITHOTRIPSY     LOWER EXTREMITY ANGIOGRAPHY Left 01/15/2021   Procedure: LOWER EXTREMITY ANGIOGRAPHY;  Surgeon: Brandon Cabal, MD;  Location: [facility];  Service: Cardiovascular;  Laterality: Left;   TEE WITHOUT CARDIOVERSION N/A 09/16/2019   Procedure: TRANSESOPHAGEAL ECHOCARDIOGRAM (TEE);  Surgeon: Brandon Olds, MD;  Location: [facility];  Service: Open Heart Surgery;  Laterality: N/A;   TONSILLECTOMY     TRANSURETHRAL RESECTION OF BLADDER TUMOR WITH MITOMYCIN-C N/A 07/24/2020   Procedure: TRANSURETHRAL RESECTION OF BLADDER TUMOR WITH Gemcitabine;  Surgeon: Brandon Sons, MD;  Location: [facility];  Service: Urology;  Laterality: N/A;   WISDOM TOOTH EXTRACTION      Prior to Admission medications   Medication Sig Start Date End Date Taking? Authorizing Provider  aspirin EC 81 MG tablet Take 81 mg by mouth in the morning. Swallow whole.   Yes [provider]  metoprolol succinate (TOPROL-XL) 50 MG 24 hr tablet Take 1 tablet (50 mg total) by mouth in the morning. 03/12/21  Yes Brandon Carbon, MD  Cholecalciferol (VITAMIN D) 50 MCG (2000 UT) CAPS Take 2,000 Units by mouth in the morning.    [provider]  clopidogrel (PLAVIX) 75 MG tablet *VIAL ONLY* TAKE ONE  TABLET BY MOUTH EVERY DAY 03/13/21   Brandon Carbon, MD  hydrocortisone (ANUSOL-HC) 2.5 % rectal cream Place 1 application rectally daily as needed for hemorrhoids or anal itching.    [provider]  MAGNESIUM GLYCINATE PO Take 500 mg by mouth every evening.    [provider]  metroNIDAZOLE (METROGEL) 0.75 % gel Apply 1 application topically 2 (two) times daily. 05/22/21   Brandon Carbon, MD  Multiple Vitamin (MULTIVITAMIN WITH MINERALS) TABS tablet Take 1 tablet by mouth daily. Centrum Silver    [provider]  pantoprazole (PROTONIX) 40 MG tablet  Take 40 mg by mouth daily as needed (indigestion/heartburn).    [provider]  rosuvastatin (CRESTOR) 10 MG tablet Take 1 tablet (10 mg total) by mouth every evening. 03/12/21   Brandon Carbon, MD  sodium chloride (OCEAN) 0.65 % nasal spray Place 1 spray into the nose daily as needed (sinus congestion).    [provider]  tamsulosin (FLOMAX) 0.4 MG CAPS capsule TAKE 1 CAPSULE BY MOUTH ONCE DAILY Patient taking differently: Take 0.4 mg by mouth daily after breakfast. 01/31/21   Brandon Carbon, MD    Allergies as of 06/11/2021 - Review Complete 06/11/2021  Allergen Reaction Noted   Cilostazol Other (See Comments) 07/15/2010   Simvastatin Other (See Comments)     Family History  Problem Relation Age of Onset   Diabetes Father        DM - and brother    Stroke Father    Coronary artery disease Paternal Grandfather    Colon cancer Neg Hx    Prostate cancer Neg Hx     Social History   Socioeconomic History   Marital status: Married    Spouse name: Not on file   Number of children: 3   Years of education: Not on file   Highest education level: Not on file  Occupational History   Occupation: Landscape architect    Comment: Retired  Tobacco Use   Smoking status: Former    Packs/day: 1.00    Years: 20.00    Pack years: 20.00    Types: Cigarettes    Quit date: 10/20/1992    Years since quitting: 28.7   Smokeless tobacco: Never   Tobacco comments:    quit in 1994   Vaping Use   Vaping Use: Never used  Substance and Sexual Activity   Alcohol use: Yes    Comment: rare   Drug use: No   Sexual activity: Not on file  Other Topics Concern   Not on file  Social History Narrative   Widowed; then remarried; 3 Lewis.      Has a living will - wife has health care POA    Son Brandon Lewis would be alternate   Would want resuscitation attempts   Would accept brief trial of artificial nutrition      Pt signed designated party release form and gives Brandon Lewis  [phone] (home #), access to medical records. Can also leave msg on home answering machine. Cell # [phone]   Social Determinants of Health   Financial Resource Strain: Not on file  Food Insecurity: Not on file  Transportation Needs: Not on file  Physical Activity: Not on file  Stress: Not on file  Social Connections: Not on file  Intimate Partner Violence: Not on file    Review of Systems: See HPI, otherwise negative ROS  Physical Exam: BP (!) 148/81   Pulse 94   Temp  97.7 F (36.5 C) (Tympanic)   Resp 16   Ht 5\' 10"  (1.778 m)   Wt 90.7 kg   SpO2 100%   BMI 28.70 kg/m  General:   Alert,  pleasant and cooperative in NAD Head:  Normocephalic and atraumatic. Neck:  Supple; no masses or thyromegaly. Lungs:  Clear throughout to auscultation, normal respiratory effort.    Heart:  +S1, +S2, Regular rate and rhythm, No edema. Abdomen:  Soft, nontender and nondistended. Normal bowel sounds, without guarding, and without rebound.   Neurologic:  Alert and  oriented x4;  grossly normal neurologically.  Impression/Plan: Brandon Lewis is here for an colonoscopy to be performed for Screening colonoscopy average risk   Risks, benefits, limitations, and alternatives regarding  colonoscopy have been reviewed with the patient.  Questions have been answered.  All parties agreeable.   Brandon Bellows, MD  07/17/2021, 12:26 PM

## 2021-07-17 NOTE — Op Note (Signed)
[facility] Gastroenterology Patient Name: Miquel Stacks Procedure Date: 07/17/2021 11:50 AM MRN: [id number] Account #: [id number] Date of Birth: October 18, 1934 Admit Type: Outpatient Age: 87 Room: [facility] ENDO ROOM 3 Gender: Male Note Status: Finalized Instrument Name: Park Meo [id number] Procedure:             Colonoscopy Indications:           Screening for colorectal malignant neoplasm Providers:             [name] MD, MD Medicines:             Monitored Anesthesia Care Complications:         No immediate complications. Procedure:             Pre-Anesthesia Assessment:                        - Prior to the procedure, a History and Physical was                         performed, and patient medications, allergies and                         sensitivities were reviewed. The patient's tolerance                         of previous anesthesia was reviewed.                        - The risks and benefits of the procedure and the                         sedation options and risks were discussed with the                         patient. All questions were answered and informed                         consent was obtained.                        - ASA Grade Assessment: II - A patient with mild                         systemic disease.                        After obtaining informed consent, the colonoscope was                         passed under direct vision. Throughout the procedure,                         the patient's blood pressure, pulse, and oxygen                         saturations were monitored continuously. The                         Colonoscope was introduced through the anus and  advanced to the the cecum, identified by the                         appendiceal orifice. The colonoscopy was performed                         with ease. The patient tolerated the procedure well.                         The quality of the bowel  preparation was excellent. Findings:      The perianal and digital rectal examinations were normal.      Two sessile polyps were found in the ascending colon. The polyps were 4       to 5 mm in size. These polyps were removed with a cold snare. Resection       and retrieval were complete.      A 2 mm polyp was found in the transverse colon. The polyp was sessile.       The polyp was removed with a cold biopsy forceps. Resection and       retrieval were complete.      Two sessile polyps were found in the rectum and descending colon. The       polyps were 5 to 6 mm in size. These polyps were removed with a cold       snare. Resection and retrieval were complete.      Multiple small-mouthed diverticula were found in the sigmoid colon.      The exam was otherwise without abnormality on direct and retroflexion       views. Impression:            - Two 4 to 5 mm polyps in the ascending colon, removed                         with a cold snare. Resected and retrieved.                        - One 2 mm polyp in the transverse colon, removed with                         a cold biopsy forceps. Resected and retrieved.                        - Two 5 to 6 mm polyps in the rectum and in the                         descending colon, removed with a cold snare. Resected                         and retrieved.                        - Diverticulosis in the sigmoid colon.                        - The examination was otherwise normal on direct and                         retroflexion views. Recommendation:        -  Discharge patient to home (with escort).                        - Resume previous diet.                        - Continue present medications.                        - Await pathology results.                        - Repeat colonoscopy is not recommended due to current                         age (2 years or older) for surveillance. Procedure Code(s):     --- Professional ---                         [id number], Colonoscopy, flexible; with removal of                         tumor(s), polyp(s), or other lesion(s) by snare                         technique                        45380, 55, Colonoscopy, flexible; with biopsy, single                         or multiple Diagnosis Code(s):     --- Professional ---                        Z12.11, Encounter for screening for malignant neoplasm                         of colon                        K62.1, Rectal polyp                        K63.5, Polyp of colon                        K57.30, Diverticulosis of large intestine without                         perforation or abscess without bleeding CPT copyright 2019 American Medical Association. All rights reserved. The codes documented in this report are preliminary and upon coder review may  be revised to meet current compliance requirements. [name], MD [name] MD, MD 07/17/2021 1:05:02 PM This report has been signed electronically. Number of Addenda: 0 Note Initiated On: 07/17/2021 11:50 AM Scope Withdrawal Time: 0 hours 17 minutes 0 seconds  Total Procedure Duration: 0 hours 20 minutes 23 seconds  Estimated Blood Loss:  Estimated blood loss: none.      [facility]

## 2021-07-17 NOTE — Transfer of Care (Signed)
Immediate Anesthesia Transfer of Care Note  Patient: Brandon Lewis  Procedure(s) Performed: COLONOSCOPY WITH PROPOFOL  Patient Location: PACU  Anesthesia Type:General  Level of Consciousness: awake, alert  and oriented  Airway & Oxygen Therapy: Patient Spontanous Breathing and Patient connected to nasal cannula oxygen  Post-op Assessment: Report given to RN and Post -op Vital signs reviewed and stable  Post vital signs: Reviewed and stable  Last Vitals:  Vitals Value Taken Time  BP    Temp    Pulse    Resp    SpO2      Last Pain:  Vitals:   07/17/21 1143  TempSrc: Tympanic  PainSc: 0-No pain         Complications: No notable events documented.

## 2021-07-17 NOTE — Anesthesia Postprocedure Evaluation (Signed)
Anesthesia Post Note  Patient: Brandon Lewis  Procedure(s) Performed: COLONOSCOPY WITH PROPOFOL  Patient location during evaluation: Endoscopy Anesthesia Type: General Level of consciousness: awake and alert and oriented Pain management: pain level controlled Vital Signs Assessment: post-procedure vital signs reviewed and stable Respiratory status: spontaneous breathing, nonlabored ventilation and respiratory function stable Cardiovascular status: blood pressure returned to baseline and stable Postop Assessment: no signs of nausea or vomiting Anesthetic complications: no   No notable events documented.   Last Vitals:  Vitals:   07/17/21 1326 07/17/21 1336  BP: 124/85 125/71  Pulse: 80 78  Resp: 17 17  Temp:    SpO2: 100% 100%    Last Pain:  Vitals:   07/17/21 1336  TempSrc:   PainSc: 0-No pain                 Maritssa Haughton

## 2021-07-18 ENCOUNTER — Encounter: Payer: Self-pay | Admitting: Gastroenterology

## 2021-07-18 LAB — SURGICAL PATHOLOGY

## 2021-07-21 ENCOUNTER — Encounter: Payer: Self-pay | Admitting: Gastroenterology

## 2021-07-29 IMAGING — MR MR HEAD W/O CM
11 series · 48 of 48 positions shown · non-contrast
Comparison: None.

CLINICAL DATA: Dizziness and headache

EXAM:
MRI HEAD WITHOUT CONTRAST
TECHNIQUE: Multiplanar, multiecho pulse sequences of the brain and surrounding
structures were obtained without intravenous contrast.

[Series 5: ax dwi_tracew · axial · 3.0mm · 0.65mm/px · z∈[-63,+91]mm · 4 of 48 slices shown]
[im 1/48]
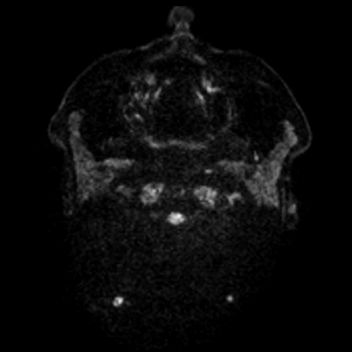
[im 16/48]
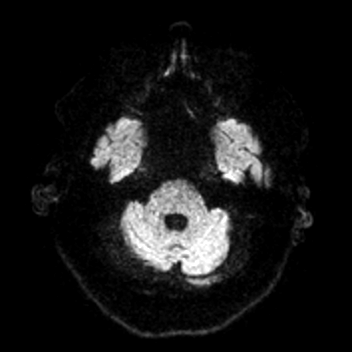
[im 32/48]
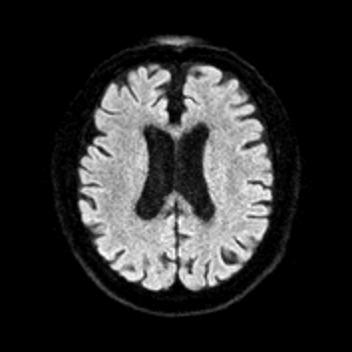
[im 48/48]
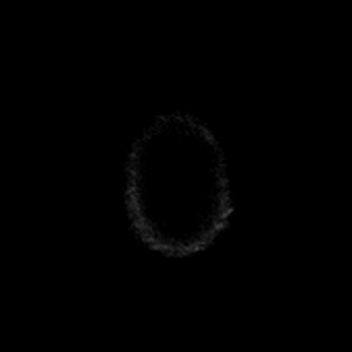

[Series 6: ax dwi_adc · axial · 3.0mm · 0.65mm/px · z∈[-63,+91]mm · 4 of 48 slices shown]
[im 1/48]
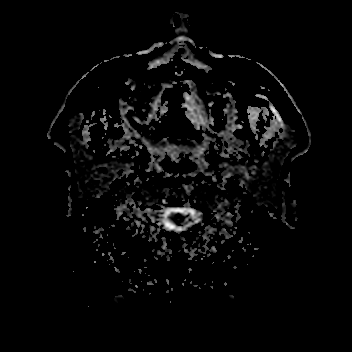
[im 16/48]
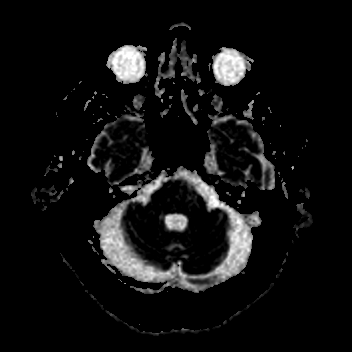
[im 32/48]
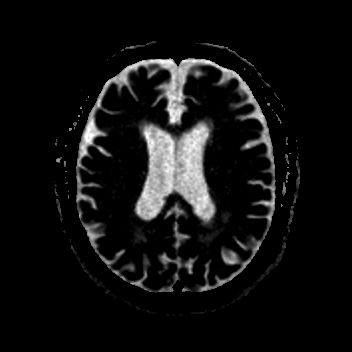
[im 48/48]
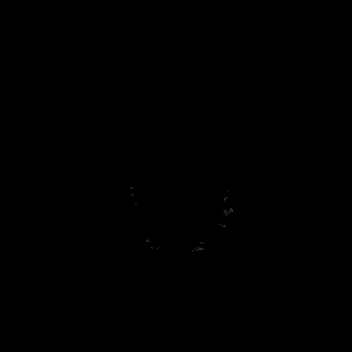

[Series 7: cor dwi_tracew · coronal · 5.0mm · 0.68mm/px · 3 of 40 slices shown]
[im 1/40]
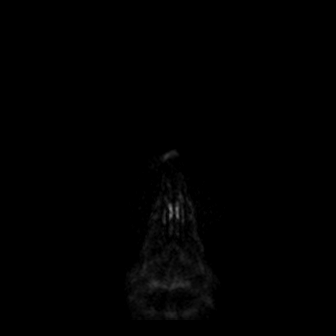
[im 20/40]
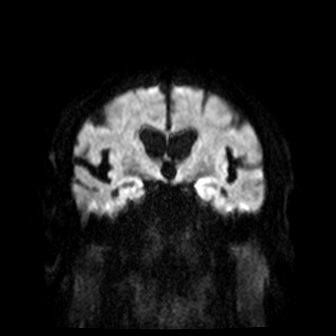
[im 40/40]
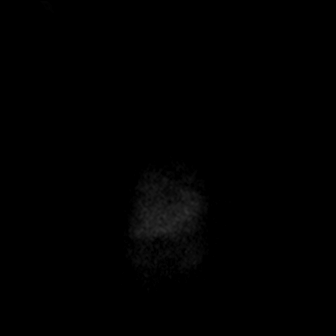

[Series 8: cor dwi_adc · coronal · 5.0mm · 0.68mm/px · 3 of 40 slices shown]
[im 1/40]
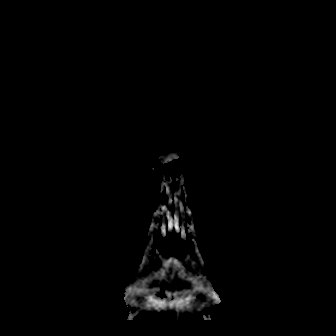
[im 20/40]
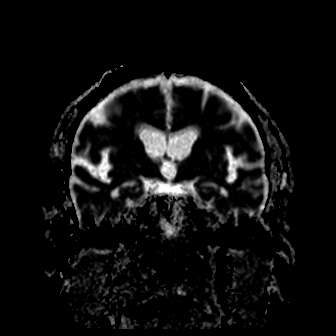
[im 40/40]
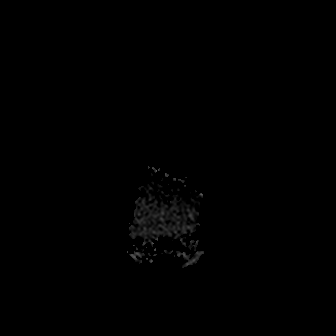

[Series 9: T1 · sagittal · 5.0mm · 0.62mm/px · 2 of 25 slices shown (1 of 2)]
[im 1/25]
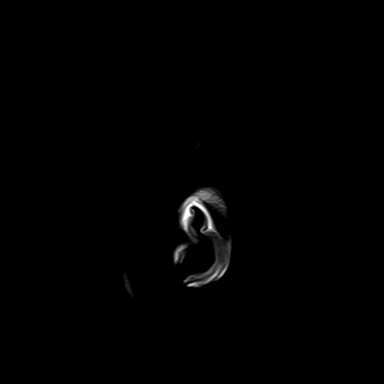
[im 25/25]
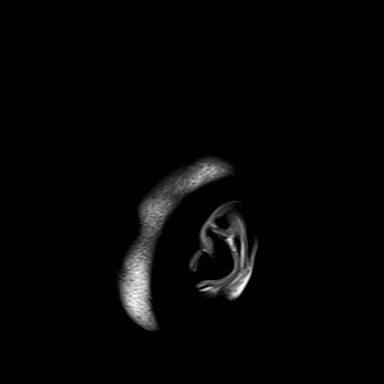

[Series 10: T2 · axial · 5.0mm · 0.53mm/px · z∈[-58,+85]mm · 2 of 25 slices shown (1 of 2)]
[im 1/25]
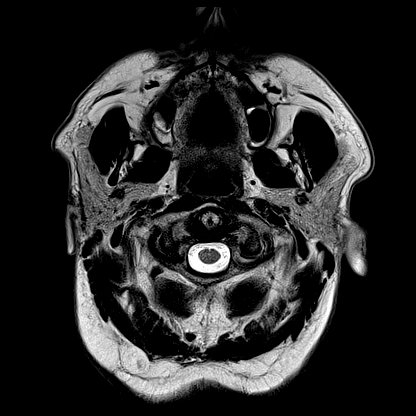
[im 25/25]
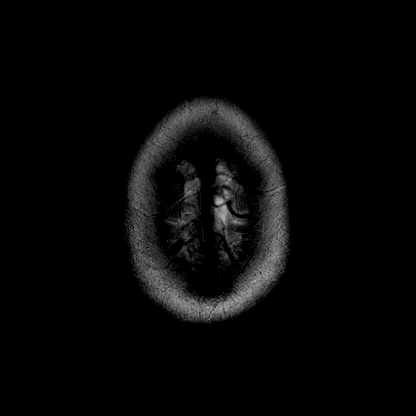

[Series 12: pha_images · axial · 3.0mm · 0.90mm/px · z∈[-74,+102]mm · 5 of 60 slices shown]
[im 1/60]
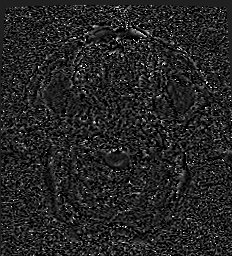
[im 15/60]
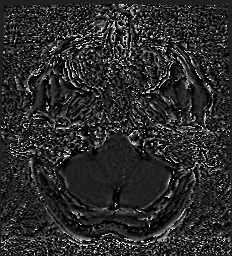
[im 30/60]
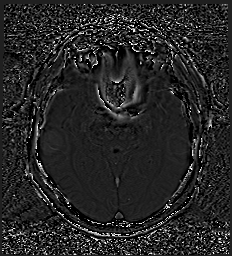
[im 45/60]
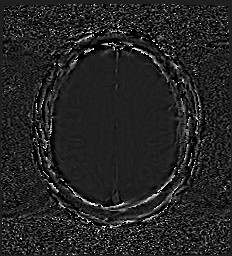
[im 60/60]
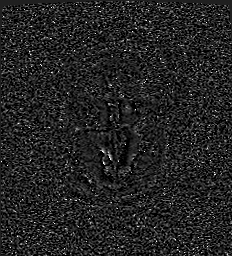

[Series 13: swi_images · axial · 3.0mm · 0.90mm/px · z∈[-74,+102]mm · 5 of 60 slices shown]
[im 1/60]
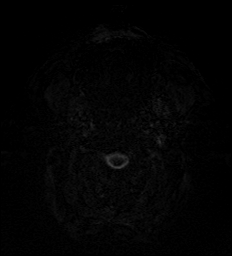
[im 15/60]
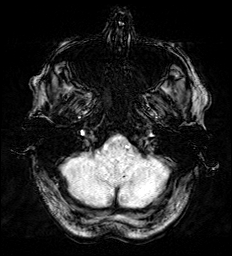
[im 30/60]
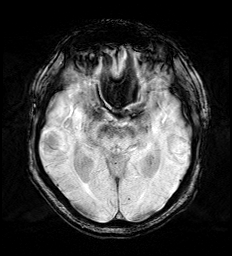
[im 45/60]
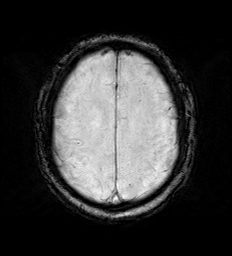
[im 60/60]
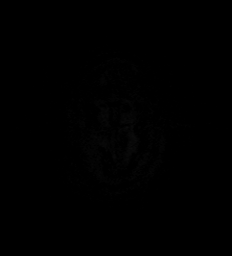

[Series 15: FLAIR · axial · 3.0mm · 0.53mm/px · z∈[-67,+93]mm · 4 of 55 slices shown]
[im 1/55]
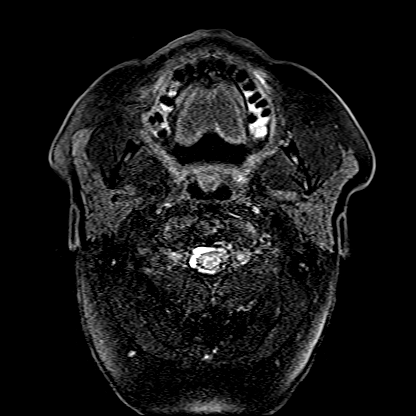
[im 19/55]
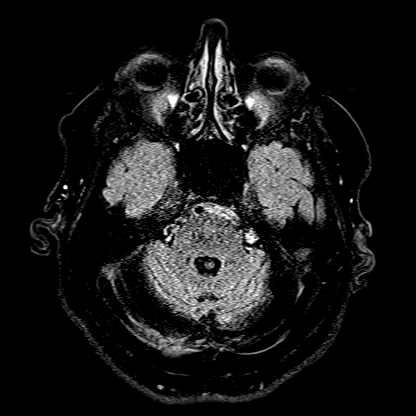
[im 37/55]
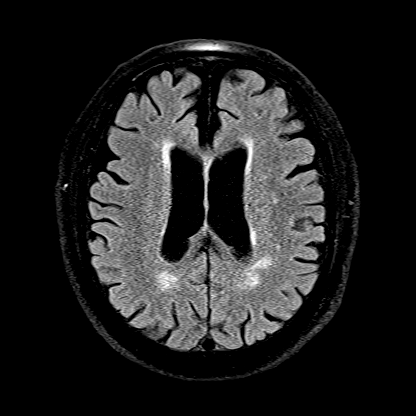
[im 55/55]
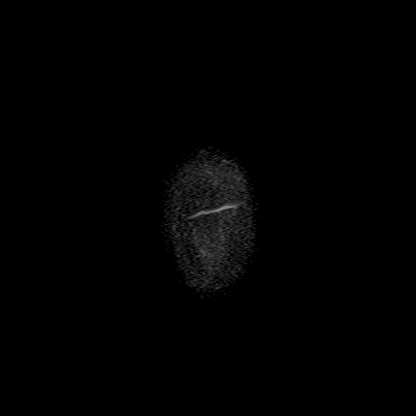

[Series 16: T1 · axial · 1.0mm · 0.98mm/px · z∈[-71,+102]mm · 14 of 176 slices shown (2 of 2)]
[im 1/176]
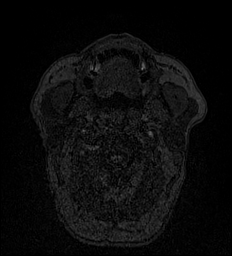
[im 14/176]
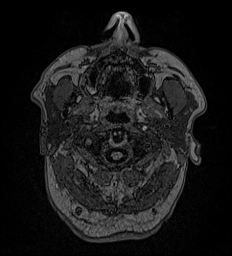
[im 27/176]
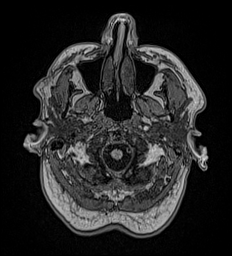
[im 41/176]
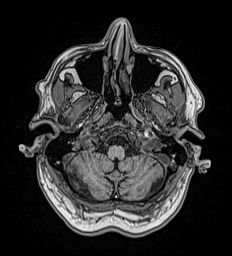
[im 54/176]
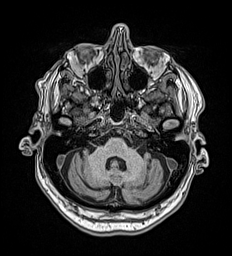
[im 68/176]
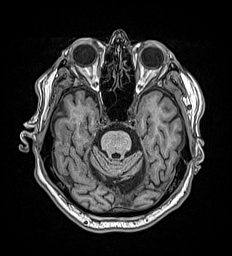
[im 81/176]
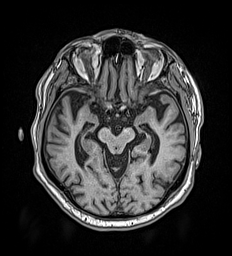
[im 95/176]
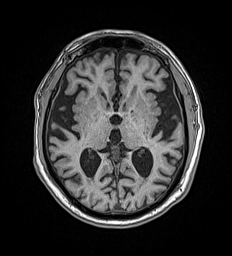
[im 108/176]
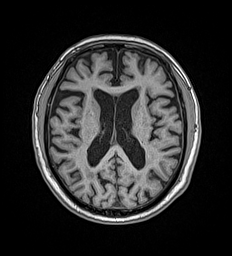
[im 122/176]
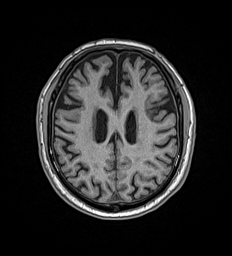
[im 135/176]
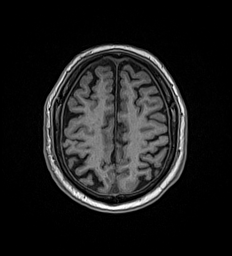
[im 149/176]
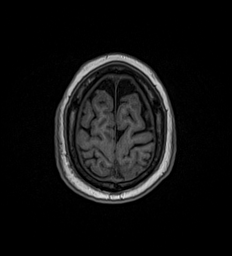
[im 162/176]
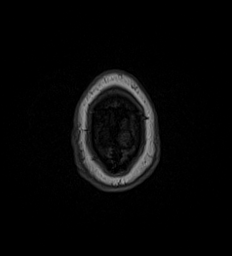
[im 176/176]
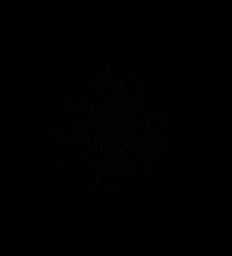

[Series 17: T2 · coronal · 5.0mm · 0.57mm/px · 2 of 29 slices shown (2 of 2)]
[im 1/29]
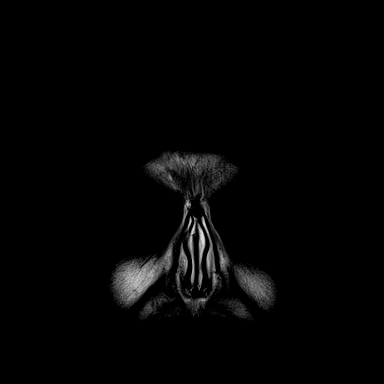
[im 29/29]
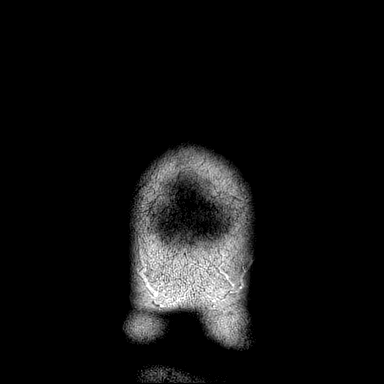

[48 of 48 positions shown; findings below may reference images not displayed]

FINDINGS: Brain: There is no acute infarction or intracranial hemorrhage.
There is no intracranial mass, mass effect, or edema. There is no
hydrocephalus or extra-axial fluid collection. Prominence of the
ventricles and sulci reflects generalized parenchymal volume loss. A
few scattered punctate foci of susceptibility in the supratentorial
subcortical white matter are most compatible with chronic
microhemorrhages. Patchy T2 hyperintensity in the supratentorial
white matter is nonspecific but may reflect mild chronic
microvascular ischemic changes.

Vascular: Major vessel flow voids at the skull base are preserved.

Skull and upper cervical spine: Normal marrow signal is preserved.

Sinuses/Orbits: Paranasal sinuses are aerated. Orbits are
unremarkable.

Other: Sella is unremarkable.  Mastoid air cells are clear.
IMPRESSION: No evidence of recent infarction, hemorrhage, or mass.

Mild chronic microvascular ischemic changes.

Mild burden of chronic microhemorrhages in a pattern more suggestive
of amyloid angiopathy and chronic hypertension.

## 2021-08-05 ENCOUNTER — Encounter (INDEPENDENT_AMBULATORY_CARE_PROVIDER_SITE_OTHER): Payer: Medicare PPO

## 2021-08-05 ENCOUNTER — Ambulatory Visit (INDEPENDENT_AMBULATORY_CARE_PROVIDER_SITE_OTHER): Payer: Medicare PPO | Admitting: Vascular Surgery

## 2021-08-07 ENCOUNTER — Encounter (INDEPENDENT_AMBULATORY_CARE_PROVIDER_SITE_OTHER): Payer: Medicare PPO

## 2021-08-07 ENCOUNTER — Ambulatory Visit (INDEPENDENT_AMBULATORY_CARE_PROVIDER_SITE_OTHER): Payer: Medicare PPO | Admitting: Nurse Practitioner

## 2021-08-23 ENCOUNTER — Other Ambulatory Visit (INDEPENDENT_AMBULATORY_CARE_PROVIDER_SITE_OTHER): Payer: Self-pay | Admitting: Vascular Surgery

## 2021-08-23 DIAGNOSIS — I7090 Unspecified atherosclerosis: Secondary | ICD-10-CM

## 2021-08-26 ENCOUNTER — Ambulatory Visit (INDEPENDENT_AMBULATORY_CARE_PROVIDER_SITE_OTHER): Payer: Medicare PPO

## 2021-08-26 ENCOUNTER — Other Ambulatory Visit: Payer: Self-pay

## 2021-08-26 ENCOUNTER — Ambulatory Visit (INDEPENDENT_AMBULATORY_CARE_PROVIDER_SITE_OTHER): Payer: Medicare PPO | Admitting: Vascular Surgery

## 2021-08-26 DIAGNOSIS — Z23 Encounter for immunization: Secondary | ICD-10-CM | POA: Diagnosis not present

## 2021-08-26 DIAGNOSIS — I7090 Unspecified atherosclerosis: Secondary | ICD-10-CM | POA: Diagnosis not present

## 2021-09-02 ENCOUNTER — Encounter (INDEPENDENT_AMBULATORY_CARE_PROVIDER_SITE_OTHER): Payer: Self-pay | Admitting: *Deleted

## 2021-09-17 ENCOUNTER — Telehealth (INDEPENDENT_AMBULATORY_CARE_PROVIDER_SITE_OTHER): Payer: Self-pay

## 2021-09-17 NOTE — Telephone Encounter (Signed)
Pt called an left a VM on the nurses line complaining of his Lt ankle weeping and swelling the pt previously had an endarterectomy on this leg in April of this year. The pt said that the weeping an swelling is about four inches above his ankle as well I made the pt aware he might need to com in for  an unna wrap. Please advise.

## 2021-09-18 NOTE — Telephone Encounter (Signed)
The patient had recent studies which show improved arterial circulation in his right leg.  We can bring the patient in for Unna wraps to see if it helpful , however if it continues we may need to work with PCP to see if there is a reason for increased swelling

## 2021-09-18 NOTE — Telephone Encounter (Signed)
I called the  pt an made him aware of the NP's instructions the pt is scheduled to come in on 12/2.

## 2021-09-19 ENCOUNTER — Encounter: Payer: Self-pay | Admitting: Urology

## 2021-09-19 ENCOUNTER — Ambulatory Visit: Payer: Medicare PPO | Admitting: Urology

## 2021-09-19 ENCOUNTER — Other Ambulatory Visit: Payer: Self-pay

## 2021-09-19 VITALS — BP 126/73 | HR 85 | Ht 70.0 in | Wt 197.0 lb

## 2021-09-19 DIAGNOSIS — C679 Malignant neoplasm of bladder, unspecified: Secondary | ICD-10-CM | POA: Diagnosis not present

## 2021-09-19 LAB — MICROSCOPIC EXAMINATION: RBC, Urine: NONE SEEN /hpf (ref 0–2)

## 2021-09-19 LAB — URINALYSIS, COMPLETE
Bilirubin, UA: NEGATIVE
Glucose, UA: NEGATIVE
Leukocytes,UA: NEGATIVE
Nitrite, UA: NEGATIVE
RBC, UA: NEGATIVE
Specific Gravity, UA: 1.025 (ref 1.005–1.030)
Urobilinogen, Ur: 0.2 mg/dL (ref 0.2–1.0)
pH, UA: 5 (ref 5.0–7.5)

## 2021-09-19 NOTE — Progress Notes (Signed)
   09/19/21  CC:  Chief Complaint  Patient presents with   Cysto    Urologic history: 1.  Ta urothelial carcinoma bladder, high-grade -TURBT 07/24/2020; ~ 2 cm right posterior wall tumor -Induction BCG completed 11/09/2020 which was tolerated well -Maintenance BCG (received only 1 dose May 2022 and doses 2/22 May 2021 due to shortage)   HPI:  Blood pressure 126/73, pulse 85, height 5\' 10"  (1.778 m), weight 197 lb (89.4 kg). NED. A&Ox3.   No respiratory distress   Abd soft, NT, ND Normal phallus with bilateral descended testicles  Cystoscopy Procedure Note  Patient identification was confirmed, informed consent was obtained, and patient was prepped using Betadine solution.  Lidocaine jelly was administered per urethral meatus.     Pre-Procedure: - Inspection reveals a normal caliber urethral meatus.  Procedure: The flexible cystoscope was introduced without difficulty - No urethral strictures/lesions are present. -  Moderate lateral lobe enlargement  prostate  - Mild elevation bladder neck - Bilateral ureteral orifices identified - Bladder mucosa  reveals slightly irregular tissue with early papillary change right lateral wall - No bladder stones -Mild trabeculation  Retroflexion shows no abnormalities   Post-Procedure: - Patient tolerated the procedure well  Assessment/ Plan: Possible early recurrence right lateral wall Findings were discussed with Brandon Lewis and recommend scheduling cystoscopy bladder biopsy/fulguration The procedure was discussed in detail including potential risks of bleeding and infection    Brandon Sons, MD

## 2021-09-20 ENCOUNTER — Ambulatory Visit (INDEPENDENT_AMBULATORY_CARE_PROVIDER_SITE_OTHER): Payer: Medicare PPO | Admitting: Nurse Practitioner

## 2021-09-20 ENCOUNTER — Encounter (INDEPENDENT_AMBULATORY_CARE_PROVIDER_SITE_OTHER): Payer: Self-pay | Admitting: Nurse Practitioner

## 2021-09-20 VITALS — BP 131/80 | HR 82 | Ht 70.0 in | Wt 197.0 lb

## 2021-09-20 DIAGNOSIS — R609 Edema, unspecified: Secondary | ICD-10-CM

## 2021-09-20 NOTE — Progress Notes (Signed)
History of Present Illness  There is no documented history at this time  Assessments & Plan   There are no diagnoses linked to this encounter.    Additional instructions  Subjective:  Patient presents with venous ulcer of the Left lower extremity.    Procedure:  3 layer unna wrap was placed Left lower extremity.   Plan:   Follow up in one week.  

## 2021-09-22 ENCOUNTER — Encounter: Payer: Self-pay | Admitting: Urology

## 2021-09-22 ENCOUNTER — Other Ambulatory Visit: Payer: Self-pay | Admitting: Urology

## 2021-09-22 DIAGNOSIS — N329 Bladder disorder, unspecified: Secondary | ICD-10-CM

## 2021-09-22 NOTE — Progress Notes (Signed)
Surgical Physician Order Form [facility] [place]  * Scheduling expectation : Next Available  *Length of Case: 30 min  *Clearance needed: yes  *Anticoagulation Instructions:  Hold Plavix  *Aspirin Instructions: Hold Plavix ok to continue ASA  *Post-op visit Date/Instructions:  1-2 week with pathology review  *Diagnosis: Bladder Lesion  *Procedure:  N/A  Cysto Bladder Biopsy (72536)   Additional orders: N/A  -Admit type: OUTpatient  -Anesthesia: Choice  -VTE Prophylaxis Standing Order SCD's       Other:   -Standing Lab Orders Per Anesthesia    Lab other: UA&Urine Culture  -Standing Test orders EKG/Chest x-ray per Anesthesia       Test other:   - Medications:  Ancef 2gm IV  -Other orders:  N/A

## 2021-09-23 ENCOUNTER — Other Ambulatory Visit: Payer: Self-pay

## 2021-09-27 ENCOUNTER — Other Ambulatory Visit: Payer: Self-pay

## 2021-09-27 ENCOUNTER — Ambulatory Visit (INDEPENDENT_AMBULATORY_CARE_PROVIDER_SITE_OTHER): Payer: Medicare PPO | Admitting: Nurse Practitioner

## 2021-09-27 VITALS — BP 129/77 | HR 73 | Ht 70.0 in | Wt 205.0 lb

## 2021-09-27 DIAGNOSIS — R609 Edema, unspecified: Secondary | ICD-10-CM | POA: Diagnosis not present

## 2021-09-27 NOTE — Progress Notes (Signed)
[facility] Posting Form   Surgery Date/Time: Date: 12/20/2022Dr  Surgeon: Dr. John Giovanni, MD  Surgery Location: Day Surgery  Inpt ( No  )   Outpt (Yes)   Obs ( No  )   Diagnosis: N32.9 Bladder lesion  -CPT: 10272   Surgery: Cystoscopy with bladder biopsy  Stop Anticoagulations: Yes, okay to continue ASA  Cardiac/Medical/Pulmonary Clearance needed: Yes   Clearance needed from Dr: [name], MD- Cardiology  Clearance request sent on: Date: 09/27/21   *Orders entered into EPIC  Date: 09/27/21   *Case booked in EPIC  Date: 09/27/21  *Notified pt of Surgery: Date: 09/25/2021  PRE-OP UA & CX: Yes, will obtain on 09/30/2021 visit  *Placed into Prior Authorization Work Fabio Bering Date: 09/27/21    Assistant/laser/rep:No

## 2021-09-27 NOTE — Addendum Note (Signed)
Addended by: Gerald Leitz A on: 09/27/2021 09:32 AM   Modules accepted: Orders

## 2021-09-27 NOTE — Progress Notes (Signed)
History of Present Illness  There is no documented history at this time  Assessments & Plan   There are no diagnoses linked to this encounter.    Additional instructions  Subjective:  Patient presents with venous ulcer of the Left lower extremity.    Procedure:  3 layer unna wrap was placed Left lower extremity.   Plan:   Follow up in one week.  

## 2021-09-28 ENCOUNTER — Encounter (INDEPENDENT_AMBULATORY_CARE_PROVIDER_SITE_OTHER): Payer: Self-pay | Admitting: Nurse Practitioner

## 2021-09-30 ENCOUNTER — Other Ambulatory Visit: Payer: Medicare PPO

## 2021-09-30 ENCOUNTER — Other Ambulatory Visit: Payer: Self-pay

## 2021-09-30 DIAGNOSIS — N329 Bladder disorder, unspecified: Secondary | ICD-10-CM

## 2021-09-30 LAB — URINALYSIS, COMPLETE
Bilirubin, UA: NEGATIVE
Glucose, UA: NEGATIVE
Ketones, UA: NEGATIVE
Leukocytes,UA: NEGATIVE
Nitrite, UA: NEGATIVE
RBC, UA: NEGATIVE
Specific Gravity, UA: 1.02 (ref 1.005–1.030)
Urobilinogen, Ur: 0.2 mg/dL (ref 0.2–1.0)
pH, UA: 5.5 (ref 5.0–7.5)

## 2021-09-30 LAB — MICROSCOPIC EXAMINATION: RBC, Urine: NONE SEEN /hpf (ref 0–2)

## 2021-10-02 ENCOUNTER — Other Ambulatory Visit: Payer: Self-pay

## 2021-10-02 ENCOUNTER — Other Ambulatory Visit
Admission: RE | Admit: 2021-10-02 | Discharge: 2021-10-02 | Disposition: A | Discharge status: Home / Self Care | Payer: Medicare PPO | Source: Ambulatory Visit | Attending: Urology | Admitting: Urology

## 2021-10-02 DIAGNOSIS — Z01818 Encounter for other preprocedural examination: Secondary | ICD-10-CM | POA: Diagnosis not present

## 2021-10-02 DIAGNOSIS — I10 Essential (primary) hypertension: Secondary | ICD-10-CM | POA: Diagnosis not present

## 2021-10-02 DIAGNOSIS — D638 Anemia in other chronic diseases classified elsewhere: Secondary | ICD-10-CM | POA: Diagnosis not present

## 2021-10-02 NOTE — Patient Instructions (Addendum)
Your procedure is scheduled on: 10/08/21 - Tuesday Report to the Registration Desk on the 1st floor of the [facility]. To find out your arrival time, please call [phone] between 1PM - 3PM on: 10/07/21 Monday Report to [facility] for labs/EKG on 10/03/21 at 12:00 pm.  REMEMBER: Instructions that are not followed completely may result in serious medical risk, up to and including death; or upon the discretion of your surgeon and anesthesiologist your surgery may need to be rescheduled.  Do not eat food or drink any fluids after midnight the night before surgery.  No gum chewing, lozengers or hard candies.  TAKE THESE MEDICATIONS THE MORNING OF SURGERY WITH A SIP OF WATER:  - pantoprazole (PROTONIX) 40 MG tablet, (take one the night before and one on the morning of surgery - helps to prevent nausea after surgery.) - metoprolol succinate (TOPROL-XL) 50 MG 24 hr tablet   Follow recommendations from Cardiologist, Pulmonologist or PCP regarding stopping Aspirin, Coumadin, Plavix, Eliquis, Pradaxa, or Pletal. Stop Plavix and Aspirin 4 days prior to procedure, may resume Plavix 3 days after surgery, resume Aspirin per MD order.  One week prior to surgery: Stop Anti-inflammatories (NSAIDS) such as Advil, Aleve, Ibuprofen, Motrin, Naproxen, Naprosyn and Aspirin based products such as Excedrin, Goodys Powder, BC Powder.  Stop ANY OVER THE COUNTER supplements until after surgery.  You may take Tylenol as directed if needed for pain up until the day of surgery.  No Alcohol for 24 hours before or after surgery.  No Smoking including e-cigarettes for 24 hours prior to surgery.  No chewable tobacco products for at least 6 hours prior to surgery.  No nicotine patches on the day of surgery.  Do not use any "recreational" drugs for at least a week prior to your surgery.  Please be advised that the combination of cocaine and anesthesia may have negative outcomes, up to and including  death. If you test positive for cocaine, your surgery will be cancelled.  On the morning of surgery brush your teeth with toothpaste and water, you may rinse your mouth with mouthwash if you wish. Do not swallow any toothpaste or mouthwash.  Take a fresh shower/bath the morning of surgery, you may apply Deodorant.  Do not wear jewelry, make-up, hairpins, clips or nail polish.  Do not wear lotions, powders, or perfumes.   Do not shave body from the neck down 48 hours prior to surgery just in case you cut yourself which could leave a site for infection.  Also, freshly shaved skin may become irritated if using the CHG soap.  Contact lenses, hearing aids and dentures may not be worn into surgery.  Do not bring valuables to the hospital. [facility] is not responsible for any missing/lost belongings or valuables.   Notify your doctor if there is any change in your medical condition (cold, fever, infection).  Wear comfortable clothing (specific to your surgery type) to the hospital.  After surgery, you can help prevent lung complications by doing breathing exercises.  Take deep breaths and cough every 1-2 hours. Your doctor may order a device called an Incentive Spirometer to help you take deep breaths. When coughing or sneezing, hold a pillow firmly against your incision with both hands. This is called splinting. Doing this helps protect your incision. It also decreases belly discomfort.  If you are being admitted to the hospital overnight, leave your suitcase in the car. After surgery it may be brought to your room.  If you  are being discharged the day of surgery, you will not be allowed to drive home. You will need a responsible adult (18 years or older) to drive you home and stay with you that night.   If you are taking public transportation, you will need to have a responsible adult (18 years or older) with you. Please confirm with your physician that it is acceptable to use public  transportation.   Please call the [facility]. at [phone] if you have any questions about these instructions.  Surgery Visitation Policy:  Patients undergoing a surgery or procedure may have one family member or support person with them as long as that person is not COVID-19 positive or experiencing its symptoms.  That person may remain in the waiting area during the procedure and may rotate out with other people.  Inpatient Visitation:    Visiting hours are 7 a.m. to 8 p.m. Up to two visitors ages 16+ are allowed at one time in a patient room. The visitors may rotate out with other people during the day. Visitors must check out when they leave, or other visitors will not be allowed. One designated support person may remain overnight. The visitor must pass COVID-19 screenings, use hand sanitizer when entering and exiting the patients room and wear a mask at all times, including in the patients room. Patients must also wear a mask when staff or their visitor are in the room. Masking is required regardless of vaccination status.

## 2021-10-03 ENCOUNTER — Encounter: Payer: Self-pay | Admitting: Urgent Care

## 2021-10-03 ENCOUNTER — Encounter
Admission: RE | Admit: 2021-10-03 | Discharge: 2021-10-03 | Disposition: A | Discharge status: Home / Self Care | Payer: Medicare PPO | Source: Ambulatory Visit | Attending: Urology | Admitting: Urology

## 2021-10-03 DIAGNOSIS — D638 Anemia in other chronic diseases classified elsewhere: Secondary | ICD-10-CM | POA: Diagnosis not present

## 2021-10-03 DIAGNOSIS — I10 Essential (primary) hypertension: Secondary | ICD-10-CM | POA: Diagnosis not present

## 2021-10-03 DIAGNOSIS — Z01810 Encounter for preprocedural cardiovascular examination: Secondary | ICD-10-CM | POA: Diagnosis not present

## 2021-10-03 DIAGNOSIS — Z01818 Encounter for other preprocedural examination: Secondary | ICD-10-CM | POA: Diagnosis not present

## 2021-10-03 LAB — BASIC METABOLIC PANEL
Anion gap: 4 — ABNORMAL LOW (ref 5–15)
BUN: 25 mg/dL — ABNORMAL HIGH (ref 8–23)
CO2: 25 mmol/L (ref 22–32)
Calcium: 9.2 mg/dL (ref 8.9–10.3)
Chloride: 107 mmol/L (ref 98–111)
Creatinine, Ser: 1.48 mg/dL — ABNORMAL HIGH (ref 0.61–1.24)
GFR, Estimated: 46 mL/min — ABNORMAL LOW (ref >60)
Glucose, Bld: 229 mg/dL — ABNORMAL HIGH (ref 70–99)
Potassium: 4.3 mmol/L (ref 3.5–5.1)
Sodium: 136 mmol/L (ref 135–145)

## 2021-10-03 LAB — CBC
HCT: 31.6 % — ABNORMAL LOW (ref 39.0–52.0)
Hemoglobin: 10.2 g/dL — ABNORMAL LOW (ref 13.0–17.0)
MCH: 38.5 pg — ABNORMAL HIGH (ref 26.0–34.0)
MCHC: 32.3 g/dL (ref 30.0–36.0)
MCV: 119.2 fL — ABNORMAL HIGH (ref 80.0–100.0)
Platelets: 155 10*3/uL (ref 150–400)
RBC: 2.65 MIL/uL — ABNORMAL LOW (ref 4.22–5.81)
RDW: 17.2 % — ABNORMAL HIGH (ref 11.5–15.5)
WBC: 11 10*3/uL — ABNORMAL HIGH (ref 4.0–10.5)
nRBC: 0 % (ref 0.0–0.2)

## 2021-10-04 ENCOUNTER — Encounter: Payer: Self-pay | Admitting: Urology

## 2021-10-04 ENCOUNTER — Ambulatory Visit (INDEPENDENT_AMBULATORY_CARE_PROVIDER_SITE_OTHER): Payer: Medicare PPO | Admitting: Nurse Practitioner

## 2021-10-04 ENCOUNTER — Telehealth: Payer: Self-pay

## 2021-10-04 ENCOUNTER — Other Ambulatory Visit: Payer: Self-pay

## 2021-10-04 VITALS — BP 143/79 | HR 76 | Ht 70.0 in | Wt 203.0 lb

## 2021-10-04 DIAGNOSIS — R609 Edema, unspecified: Secondary | ICD-10-CM

## 2021-10-04 MED ORDER — AMOXICILLIN 875 MG PO TABS: 875.0000 mg | ORAL_TABLET | Freq: Two times a day (BID) | ORAL | 0 refills | Status: DC

## 2021-10-04 NOTE — Progress Notes (Signed)
History of Present Illness  There is no documented history at this time  Assessments & Plan   There are no diagnoses linked to this encounter.    Additional instructions  Subjective:  Patient presents with venous ulcer of the Left lower extremity.    Procedure:  3 layer unna wrap was placed Left lower extremity.   Plan:   Follow up in one week.  

## 2021-10-04 NOTE — Telephone Encounter (Signed)
-----   Message from Abbie Sons, MD sent at 10/04/2021 12:22 PM EST ----- Preliminary urine culture is growing Enterococcus.  Please send Rx Amoxil 875 mg twice daily x7 days and have him start taking tomorrow at the latest.

## 2021-10-04 NOTE — Telephone Encounter (Signed)
Spoke with pt. Pt. Advised of positive culture results and need for treatment. I have sent medication to [facility] which is the pharmacy patient requested. Patient verbalized understanding to start medication today.

## 2021-10-04 NOTE — Progress Notes (Signed)
Perioperative Services  Pre-Admission/Anesthesia Testing Clinical Review  Date: 10/04/21  Patient Demographics:  Name: Brandon Lewis DOB:   Nov 22, 1933 MRN:   [id number]  Planned Surgical Procedure(s):    Case: [id number] Date/Time: 10/08/21 1000   Procedure: CYSTOSCOPY WITH BLADDER BIOPSY   Anesthesia type: General   Pre-op diagnosis: Bladder Lesion   Location: [facility] OR ROOM 10 / [facility] ORS FOR ANESTHESIA GROUP   Surgeons: Abbie Sons, MD   NOTE: Available PAT nursing documentation and vital signs have been reviewed. Clinical nursing staff has updated patient's PMH/PSHx, current medication list, and drug allergies/intolerances to ensure comprehensive history available to assist in medical decision making as it pertains to the aforementioned surgical procedure and anticipated anesthetic course. Extensive review of available clinical information performed. Brandon Lewis PMH and PSHx updated with any diagnoses/procedures that  may have been inadvertently omitted during his intake with the pre-admission testing department's nursing staff.  Clinical Discussion:  Brandon Lewis is a 87 y.o. male who is submitted for pre-surgical anesthesia review and clearance prior to him undergoing the above procedure. Patient is a Former Smoker (20 pack years; quit 10/1992). Pertinent PMH includes: CAD, NSTEMI, postoperative atrial flutter, aortic thoracic aortic aneurysm, aortic atherosclerosis, PAD (s/p femoral artery bypass graft in 2003), HTN, HLD, CKD-III, GERD (on daily PPI), anemia, OA, BPH.   Patient is followed by cardiology Brandon Massed, MD). He was last seen in the cardiology clinic on 02/05/2021; notes reviewed.  At the time of his clinic visit, patient noted to be doing well overall from a cardiovascular perspective. He denied chest pain, shortness of breath, orthopnea, PND, palpitations, peripheral edema, vertiginous symptoms, and presyncope/syncope. He complained of claudication pain in his  lower extremities associated with ambulation. Patient with a significant cardiovascular history.   Patient s/p aortobifemoral bypass grafting that was performed in 2003.    Patient suffered an NSTEMI on 09/11/2019. TTE at that time revealed normal LV systolic function; LVEF 50 to 55%. Subsequent diagnostic left heart catheterization on 09/12/2019 revealed severe LAD and RCA stenosis. High grade disease not amenable to PCI, thus patient was transferred from [facility] to [facility] for further evaluation and treatment.  Patient underwent CABG x 4 at [facility] on 09/16/2019 with a LIMA-LAD, SVG-RCA, SVG-OM1, and SVG-D1. Procedure was complicated by post-operative atrial flutter. Patient discharged on oral amiodarone, which at this point has been discontinued.    Post CABG TEE on 09/16/2019 revealed normal LV and RV systolic function; LVEF greater than 55%.    Repeat TTE done on 11/24/2019 remains unchanged from previous.     Exercise stress test done on 04/30/2020 revealed no evidence of ischemia or arrhythmia, however testing had to be truncated, after 2:06, due to severe claudication symptoms   Blood pressure well controlled at 110/74 on currently prescribed beta-blocker monotherapy.  Patient on a statin for his HLD.  He remains on daily DAPT therapy (ASA + clopidogrel); compliant with therapy with no evidence of GI bleeding. He is not diabetic  Functional capacity, as defined by DASI, is documented as being </= 4 METS.  No changes were made to patient's medication regimen.  Patient to follow-up with outpatient cardiology in 6 months or sooner if needed.  Brandon Lewis is scheduled for an CYSTOSCOPY WITH BLADDER BIOPSY on 10/08/2021 with Dr. John Giovanni, MD.  Given patient's past medical history significant for cardiovascular diagnoses, presurgical cardiac clearance was sought by the PAT team. Per cardiology, "this patient is optimized for surgery and may proceed with  the planned procedural course  with a LOW risk of significant perioperative cardiovascular complications".  Again, this patient is on daily DAPT therapy. He has been instructed on recommendations for holding his clopidogrel for 4 days prior to his procedure with plans to restart as soon as postoperative bleeding risk felt to be minimized by his attending surgeon. The patient has been instructed that his last dose of his anticoagulants will be on 10/03/2021.  Patient to continue his daily low-dose ASA throughout the perioperative period.  Patient denies previous perioperative complications with anesthesia in the past. In review of the available records, it is noted that patient underwent a general anesthetic course here (ASA III) in 06/2021 without documented complications.   Vitals with BMI 09/27/2021 09/20/2021 09/19/2021  Height 5\' 10"  5\' 10"  5\' 10"   Weight 205 lbs 197 lbs 197 lbs  BMI 29.41 07.37 10.62  Systolic 694 854 627  Diastolic 77 80 73  Pulse 73 82 85    Providers/Specialists:   NOTE: Primary physician provider listed below. Patient may have been seen by APP or partner within same practice.   PROVIDER ROLE / SPECIALTY LAST Claud Kelp, MD Urology 09/22/2021  Venia Carbon, MD Primary Care Provider 05/22/2021  Serafina Royals, MD Cardiology 02/06/2019   Allergies:  Cilostazol and Simvastatin  Current Home Medications:   No current facility-administered medications for this encounter.    aspirin EC 81 MG tablet   calcium carbonate (TUMS - DOSED IN MG ELEMENTAL CALCIUM) 500 MG chewable tablet   Cholecalciferol (VITAMIN D) 50 MCG (2000 UT) CAPS   clopidogrel (PLAVIX) 75 MG tablet   hydrocortisone (ANUSOL-HC) 2.5 % rectal cream   MAGNESIUM GLYCINATE PO   metoprolol succinate (TOPROL-XL) 50 MG 24 hr tablet   metroNIDAZOLE (METROGEL) 0.75 % gel   Multiple Vitamin (MULTIVITAMIN WITH MINERALS) TABS tablet   pantoprazole (PROTONIX) 40 MG tablet   rosuvastatin (CRESTOR) 10 MG tablet   sodium  chloride (OCEAN) 0.65 % nasal spray   tamsulosin (FLOMAX) 0.4 MG CAPS capsule   TURMERIC CURCUMIN PO   History:   Past Medical History:  Diagnosis Date   Anemia    Aortic atherosclerosis (HCC)    Atrial flutter, paroxysmal (HCC)    a.) postoperatively following CABG   Bladder cancer ([place]) 04/2020   BPH (benign prostatic hyperplasia)    CAD (coronary artery disease) 09/12/2019   a.) LHC 09/12/2019: EF 55-60%; 85% m-dLM, 50% pLCx, 85% p-m LAD, 70% pLAD, 45% mLAD, 70% p-mRCA. b.) 4v CABG 09/16/2019.   CKD (chronic kidney disease), stage III (HCC)    Diverticulosis    GERD (gastroesophageal reflux disease)    History of kidney stones    HTN (hypertension)    Hyperlipidemia    Long term current use of antithrombotics/antiplatelets    a.) DAPT therapies (ASA + clopidogrel)   NSTEMI (non-ST elevated myocardial infarction) ([place]) 09/11/2019   a.) LHC 09/12/2019: 85% m-dLM, 50% pLCx, 85% p-m LAD, 70% pLAD, 45% mLAD, 70% p-mRCA; transferred to [facility] for CVTS consult. Underwent 4v CABG on 09/16/2019.   Osteoarthritis    PVD (peripheral vascular disease) ([place])    a.) s/p aortobifemoral bypass, with bilateral SFA occlusion and intermittent claudication   Right thyroid nodule 09/14/2019   a.) CT 09/14/2019 and 10/16/2020; measured 2.2 cm   S/P CABG x 4 09/16/2019   a.) 4v CABG: LIMA-LAD, SVG-RCA, SVG-OM1, SVG-D1   Small bowel obstruction (HCC)    after surgeries   Thoracic ascending aortic aneurysm  a.) measured 4.1 cm by CT on 09/14/2019   Past Surgical History:  Procedure Laterality Date   aorto-bifem  2003   [facility]    CAROTID STENT     [place]   CATARACT EXTRACTION, BILATERAL  2017   CHOLECYSTECTOMY  2002   COLONOSCOPY     COLONOSCOPY WITH PROPOFOL N/A 07/17/2021   Procedure: COLONOSCOPY WITH PROPOFOL;  Surgeon: [name], MD;  [facility] ENDOSCOPY;  Service: Gastroenterology;  Laterality: N/A;   CORONARY ARTERY BYPASS GRAFT N/A 09/16/2019   Procedure: CORONARY ARTERY  BYPASS GRAFTING (CABG) using LIMA to LAD; Endoscopic right saphenous vein harvest to OM1, Diag1, and RCA.;  Surgeon: Wonda Olds, MD;  Location: [facility];  Service: Open Heart Surgery;  Laterality: N/A;   ENDARTERECTOMY FEMORAL Left 03/13/2021   Procedure: ENDARTERECTOMY FEMORAL (GROIN REVISION);  Surgeon: Katha Cabal, MD;  Location: [facility] ORS;  Service: Vascular;  Laterality: Left;   LEFT HEART CATH AND CORONARY ANGIOGRAPHY N/A 09/12/2019   Procedure: LEFT HEART CATH AND CORONARY ANGIOGRAPHY;  Surgeon: Corey Skains, MD;  Location: [facility] CV LAB;  Service: Cardiovascular;  Laterality: N/A;   LITHOTRIPSY     LOWER EXTREMITY ANGIOGRAPHY Left 01/15/2021   Procedure: LOWER EXTREMITY ANGIOGRAPHY;  Surgeon: Katha Cabal, MD;  Location: [facility] CV LAB;  Service: Cardiovascular;  Laterality: Left;   TEE WITHOUT CARDIOVERSION N/A 09/16/2019   Procedure: TRANSESOPHAGEAL ECHOCARDIOGRAM (TEE);  Surgeon: Wonda Olds, MD;  Location: [facility];  Service: Open Heart Surgery;  Laterality: N/A;   TONSILLECTOMY     TRANSURETHRAL RESECTION OF BLADDER TUMOR WITH MITOMYCIN-C N/A 07/24/2020   Procedure: TRANSURETHRAL RESECTION OF BLADDER TUMOR WITH Gemcitabine;  Surgeon: Abbie Sons, MD;  Location: [facility] ORS;  Service: Urology;  Laterality: N/A;   WISDOM TOOTH EXTRACTION     Family History  Problem Relation Age of Onset   Diabetes Father        DM - and brother    Stroke Father    Coronary artery disease Paternal Grandfather    Colon cancer Neg Hx    Prostate cancer Neg Hx    Social History   Tobacco Use   Smoking status: Former    Packs/day: 1.00    Years: 20.00    Pack years: 20.00    Types: Cigarettes    Quit date: 10/20/1992    Years since quitting: 28.9   Smokeless tobacco: Never   Tobacco comments:    quit in 1994   Vaping Use   Vaping Use: Never used  Substance Use Topics   Alcohol use: Yes    Comment: rare   Drug use: No    Pertinent Clinical Results:   LABS: Labs reviewed: Acceptable for surgery.  Hospital Outpatient Visit on 10/02/2021  Component Date Value Ref Range Status   WBC 10/03/2021 11.0 (H)  4.0 - 10.5 K/uL Final   RBC 10/03/2021 2.65 (L)  4.22 - 5.81 MIL/uL Final   Hemoglobin 10/03/2021 10.2 (L)  13.0 - 17.0 g/dL Final   HCT 10/03/2021 31.6 (L)  39.0 - 52.0 % Final   MCV 10/03/2021 119.2 (H)  80.0 - 100.0 fL Final   MCH 10/03/2021 38.5 (H)  26.0 - 34.0 pg Final   MCHC 10/03/2021 32.3  30.0 - 36.0 g/dL Final   RDW 10/03/2021 17.2 (H)  11.5 - 15.5 % Final   Platelets 10/03/2021 155  150 - 400 K/uL Final   nRBC 10/03/2021 0.0  0.0 - 0.2 % Final  Performed at [facility], [address] [place], [place] [address]   Sodium 10/03/2021 136  135 - 145 mmol/L Final   Potassium 10/03/2021 4.3  3.5 - 5.1 mmol/L Final   Chloride 10/03/2021 107  98 - 111 mmol/L Final   CO2 10/03/2021 25  22 - 32 mmol/L Final   Glucose, Bld 10/03/2021 229 (H)  70 - 99 mg/dL Final   Glucose reference range applies only to samples taken after fasting for at least 8 hours.   BUN 10/03/2021 25 (H)  8 - 23 mg/dL Final   Creatinine, Ser 10/03/2021 1.48 (H)  0.61 - 1.24 mg/dL Final   Calcium 10/03/2021 9.2  8.9 - 10.3 mg/dL Final   GFR, Estimated 10/03/2021 46 (L)  >60 mL/min Final   Comment: (NOTE) Calculated using the CKD-EPI Creatinine Equation (2021)    Anion gap 10/03/2021 4 (L)  5 - 15 Final   Performed at [facility], [address] [place], [place] [address]    ECG: Date: 10/03/2021 Time ECG obtained: 1212 PM Rate: 83 bpm Rhythm: normal sinus Axis (leads I and aVF): Normal Intervals: PR 148 ms. QRS 90 ms. QTc 420 ms. ST segment and T wave changes: No evidence of acute ST segment elevation or depression Comparison: Similar to previous tracing obtained on 03/25/2021   IMAGING / PROCEDURES: CTA CHEST, ABDOMEN, PELVIS performed on 10/16/2020 No evidence of thoracic or abdominal aortic dissection or aneurysm is  noted. Status post stent graft repair of infrarenal abdominal aorta. The graft and its limbs are widely patent. Severe narrowing is seen involving visualized portions of proximal superficial femoral arteries bilaterally secondary to atheromatous disease 2.2 cm right thyroid nodule is noted. Recommend thyroid US Diverticulosis of distal descending colon and proximal sigmoid colon is noted with minimal surrounding inflammatory changes which may represent mild diverticulitis Stable 1 cm perifissural nodule is noted in right middle lobe; follow-up unenhanced chest CT in 6 months is recommended to ensure stability and rule out neoplasm Small fat containing left inguinal hernia is noted Mild prostatic enlargement Aortic atherosclerosis   CT HEMATURIA WORKUP performed on 06/15/2020 18 x 13 x 8 mm polypoid enhancing soft tissue lesion along the posterior bladder dome, highly suspicious for urothelial neoplasm. No evidence for metastatic disease in the abdomen or pelvis. Hepatic and renal cysts. Left groin hernia contains only fat. Status post aorto bi femoral bypass graft. 9 mm perifissural nodule in the right middle lobe stable since chest CT 09/14/2019. Continued attention on follow-up recommended.   EXERCISE STRESS TEST performed on 04/30/2020 Exercised on Bruce protocol for 02:06 with truncation of test due to and severe claudication Functional capacity 4.6 METS Resting heart rate of 80 bpm rose to a maximum heart rate of 115 (84% of maximal age-predicted heart rate); normal response to exercise Resting blood pressure of 142/86 mmHg rose to a maximum blood pressure 200/84 mmHg; appropriate response to exercise Resting ECG normal with no evidence of ischemia, arrhythmia, or ST changes   TRANSTHORACIC ECHOCARDIOGRAM performed on 11/28/2019 LVEF 99% Normal LV systolic function with mild LVH Normal RV systolic function Mild TR and PR; trivial MR; no AR No valvular stenosis   TRANSESOPHAGEAL  ECHOCARDIOGRAM performed on 09/16/2019 Post Bypass:  Tricuspid, Pulmonic, Mitral and Aortic valve unchanged.  CO & CI wnl.  LVEF > 55%, RWA improved. No dissection noted after cannula removed.    CORONARY ARTERY BYPASS GRAFTING performed on 09/16/2019 Four-vessel CABG procedure LIMA-LAD SVG-RCA SVG-OM1 SVG-D1  LEFT HEART CATHETERIZATION  AND CORONARY ANGIOGRAPHY performed on 09/12/2019 Echocardiogram and LV angiogram showing normal LV systolic function with an LVEF of 55 to 60%. Mid LM to distal LM lesion is 85% stenosed Proximal circumflex lesions 50% stenosed Proximal LAD to mid LAD lesion is 85% stenosed Proximal LAD lesion is 70% stenosed Mid LAD lesion is 45% stenosed Proximal RCA to mid RCA lesion 70% stenosed     Impression and Plan:  Brandon Lewis has been referred for pre-anesthesia review and clearance prior to him undergoing the planned anesthetic and procedural courses. Available labs, pertinent testing, and imaging results were personally reviewed by me. This patient has been appropriately cleared by cardiology with an overall LOW risk of significant perioperative cardiovascular complications.  Based on clinical review performed today (10/04/21), barring any significant acute changes in the patient's overall condition, it is anticipated that he will be able to proceed with the planned surgical intervention. Any acute changes in clinical condition may necessitate his procedure being postponed and/or cancelled. Patient will meet with anesthesia team (MD and/or CRNA) on the day of his procedure for preoperative evaluation/assessment. Questions regarding anesthetic course will be fielded at that time.   Pre-surgical instructions were reviewed with the patient during his PAT appointment and questions were fielded by PAT clinical staff. Patient was advised that if any questions or concerns arise prior to his procedure then he should return a call to PAT and/or his surgeon's  office to discuss.  Honor Loh, MSN, APRN, FNP-C, CEN [facility]  Peri-operative Services Nurse Practitioner Phone: [phone] Fax: [phone] 10/04/21 10:13 AM  NOTE: This note has been prepared using Dragon dictation software. Despite my best ability to proofread, there is always the potential that unintentional transcriptional errors may still occur from this process.

## 2021-10-05 LAB — CULTURE, URINE COMPREHENSIVE

## 2021-10-06 ENCOUNTER — Encounter (INDEPENDENT_AMBULATORY_CARE_PROVIDER_SITE_OTHER): Payer: Self-pay | Admitting: Nurse Practitioner

## 2021-10-08 ENCOUNTER — Other Ambulatory Visit: Payer: Self-pay

## 2021-10-08 ENCOUNTER — Encounter: Payer: Self-pay | Admitting: Urology

## 2021-10-08 ENCOUNTER — Encounter
Admission: RE | Disposition: A | Discharge status: Home / Self Care | Payer: Self-pay | Source: Home / Self Care | Attending: Urology

## 2021-10-08 ENCOUNTER — Ambulatory Visit
Admission: RE | Admit: 2021-10-08 | Discharge: 2021-10-08 | Disposition: A | Discharge status: Home / Self Care | Payer: Medicare PPO | Attending: Urology | Admitting: Urology

## 2021-10-08 ENCOUNTER — Ambulatory Visit: Payer: Medicare PPO | Admitting: Urgent Care

## 2021-10-08 DIAGNOSIS — N329 Bladder disorder, unspecified: Secondary | ICD-10-CM

## 2021-10-08 DIAGNOSIS — Z951 Presence of aortocoronary bypass graft: Secondary | ICD-10-CM | POA: Insufficient documentation

## 2021-10-08 DIAGNOSIS — I712 Thoracic aortic aneurysm, without rupture, unspecified: Secondary | ICD-10-CM | POA: Insufficient documentation

## 2021-10-08 DIAGNOSIS — N183 Chronic kidney disease, stage 3 unspecified: Secondary | ICD-10-CM | POA: Diagnosis not present

## 2021-10-08 DIAGNOSIS — I2510 Atherosclerotic heart disease of native coronary artery without angina pectoris: Secondary | ICD-10-CM | POA: Insufficient documentation

## 2021-10-08 DIAGNOSIS — I4892 Unspecified atrial flutter: Secondary | ICD-10-CM | POA: Diagnosis not present

## 2021-10-08 DIAGNOSIS — I252 Old myocardial infarction: Secondary | ICD-10-CM | POA: Insufficient documentation

## 2021-10-08 DIAGNOSIS — Z79899 Other long term (current) drug therapy: Secondary | ICD-10-CM | POA: Diagnosis not present

## 2021-10-08 DIAGNOSIS — Z7902 Long term (current) use of antithrombotics/antiplatelets: Secondary | ICD-10-CM | POA: Insufficient documentation

## 2021-10-08 DIAGNOSIS — Z7982 Long term (current) use of aspirin: Secondary | ICD-10-CM | POA: Diagnosis not present

## 2021-10-08 DIAGNOSIS — Z95820 Peripheral vascular angioplasty status with implants and grafts: Secondary | ICD-10-CM | POA: Insufficient documentation

## 2021-10-08 DIAGNOSIS — K219 Gastro-esophageal reflux disease without esophagitis: Secondary | ICD-10-CM | POA: Insufficient documentation

## 2021-10-08 DIAGNOSIS — I700 Atherosclerosis of aorta: Secondary | ICD-10-CM | POA: Insufficient documentation

## 2021-10-08 DIAGNOSIS — D090 Carcinoma in situ of bladder: Secondary | ICD-10-CM | POA: Diagnosis not present

## 2021-10-08 DIAGNOSIS — C675 Malignant neoplasm of bladder neck: Secondary | ICD-10-CM | POA: Diagnosis not present

## 2021-10-08 DIAGNOSIS — I129 Hypertensive chronic kidney disease with stage 1 through stage 4 chronic kidney disease, or unspecified chronic kidney disease: Secondary | ICD-10-CM | POA: Insufficient documentation

## 2021-10-08 DIAGNOSIS — N400 Enlarged prostate without lower urinary tract symptoms: Secondary | ICD-10-CM | POA: Diagnosis not present

## 2021-10-08 DIAGNOSIS — Z87891 Personal history of nicotine dependence: Secondary | ICD-10-CM | POA: Insufficient documentation

## 2021-10-08 DIAGNOSIS — E785 Hyperlipidemia, unspecified: Secondary | ICD-10-CM | POA: Insufficient documentation

## 2021-10-08 DIAGNOSIS — D494 Neoplasm of unspecified behavior of bladder: Secondary | ICD-10-CM | POA: Diagnosis not present

## 2021-10-08 DIAGNOSIS — I739 Peripheral vascular disease, unspecified: Secondary | ICD-10-CM | POA: Insufficient documentation

## 2021-10-08 DIAGNOSIS — C679 Malignant neoplasm of bladder, unspecified: Secondary | ICD-10-CM | POA: Diagnosis not present

## 2021-10-08 HISTORY — PX: TRANSURETHRAL RESECTION OF BLADDER TUMOR: SHX2575

## 2021-10-08 HISTORY — DX: Unspecified atrial flutter: I48.92

## 2021-10-08 HISTORY — DX: Chronic kidney disease, stage 3 unspecified: N18.30

## 2021-10-08 HISTORY — DX: Long term (current) use of antithrombotics/antiplatelets: Z79.02

## 2021-10-08 HISTORY — DX: Benign prostatic hyperplasia without lower urinary tract symptoms: N40.0

## 2021-10-08 HISTORY — PX: CYSTOSCOPY WITH BIOPSY: SHX5122

## 2021-10-08 SURGERY — CYSTOSCOPY, WITH BIOPSY
Anesthesia: General

## 2021-10-08 MED ORDER — LIDOCAINE HCL (PF) 2 % IJ SOLN
INTRAMUSCULAR | Status: AC
  Filled 2021-10-08: qty 15

## 2021-10-08 MED ORDER — CHLORHEXIDINE GLUCONATE 0.12 % MT SOLN
OROMUCOSAL | Status: AC
  Filled 2021-10-08: qty 15

## 2021-10-08 MED ORDER — STERILE WATER FOR IRRIGATION IR SOLN
Status: DC | PRN
  Administered 2021-10-08: 300 mL via INTRAVESICAL

## 2021-10-08 MED ORDER — SODIUM CHLORIDE 0.9 % IR SOLN
Status: DC | PRN
  Administered 2021-10-08: 3000 mL via INTRAVESICAL

## 2021-10-08 MED ORDER — FENTANYL CITRATE (PF) 100 MCG/2ML IJ SOLN: 25.0000 ug | INTRAMUSCULAR | Status: DC | PRN

## 2021-10-08 MED ORDER — FENTANYL CITRATE (PF) 100 MCG/2ML IJ SOLN
INTRAMUSCULAR | Status: AC
  Filled 2021-10-08: qty 2

## 2021-10-08 MED ORDER — CHLORHEXIDINE GLUCONATE 0.12 % MT SOLN
15.0000 mL | Freq: Once | OROMUCOSAL | Status: AC
  Administered 2021-10-08: 07:00:00 15 mL via OROMUCOSAL

## 2021-10-08 MED ORDER — CEFAZOLIN SODIUM-DEXTROSE 2-4 GM/100ML-% IV SOLN
INTRAVENOUS | Status: AC
  Filled 2021-10-08: qty 100

## 2021-10-08 MED ORDER — LACTATED RINGERS IV SOLN: INTRAVENOUS | Status: DC

## 2021-10-08 MED ORDER — ONDANSETRON HCL 4 MG/2ML IJ SOLN
INTRAMUSCULAR | Status: DC | PRN
  Administered 2021-10-08: 4 mg via INTRAVENOUS

## 2021-10-08 MED ORDER — PROPOFOL 10 MG/ML IV BOLUS
INTRAVENOUS | Status: DC | PRN
  Administered 2021-10-08: 50 mg via INTRAVENOUS
  Administered 2021-10-08: 100 mg via INTRAVENOUS
  Administered 2021-10-08: 50 mg via INTRAVENOUS

## 2021-10-08 MED ORDER — ONDANSETRON HCL 4 MG/2ML IJ SOLN: 4.0000 mg | Freq: Once | INTRAMUSCULAR | Status: DC | PRN

## 2021-10-08 MED ORDER — MEPERIDINE HCL 25 MG/ML IJ SOLN: 6.2500 mg | INTRAMUSCULAR | Status: DC | PRN

## 2021-10-08 MED ORDER — ORAL CARE MOUTH RINSE: 15.0000 mL | Freq: Once | OROMUCOSAL | Status: AC

## 2021-10-08 MED ORDER — PHENYLEPHRINE HCL (PRESSORS) 10 MG/ML IV SOLN
INTRAVENOUS | Status: DC | PRN
  Administered 2021-10-08: 150 ug via INTRAVENOUS
  Administered 2021-10-08: 100 ug via INTRAVENOUS

## 2021-10-08 MED ORDER — LIDOCAINE HCL (CARDIAC) PF 100 MG/5ML IV SOSY
PREFILLED_SYRINGE | INTRAVENOUS | Status: DC | PRN
  Administered 2021-10-08: 40 mg via INTRAVENOUS
  Administered 2021-10-08: 60 mg via INTRAVENOUS

## 2021-10-08 MED ORDER — FENTANYL CITRATE (PF) 100 MCG/2ML IJ SOLN
INTRAMUSCULAR | Status: DC | PRN
  Administered 2021-10-08: 50 ug via INTRAVENOUS
  Administered 2021-10-08: 25 ug via INTRAVENOUS

## 2021-10-08 MED ORDER — PROPOFOL 10 MG/ML IV BOLUS
INTRAVENOUS | Status: AC
  Filled 2021-10-08: qty 20

## 2021-10-08 MED ORDER — CEFAZOLIN SODIUM-DEXTROSE 2-4 GM/100ML-% IV SOLN
2.0000 g | INTRAVENOUS | Status: AC
  Administered 2021-10-08: 09:00:00 2 g via INTRAVENOUS

## 2021-10-08 SURGICAL SUPPLY — 25 items
BAG DRAIN CYSTO-URO LG1000N (MISCELLANEOUS) ×5 IMPLANT
BRUSH SCRUB EZ 1% IODOPHOR (MISCELLANEOUS) ×5 IMPLANT
CATH COUDE FOLEY 5CC 14FR (CATHETERS) ×2 IMPLANT
DRSG TELFA 4X3 1S NADH ST (GAUZE/BANDAGES/DRESSINGS) ×5 IMPLANT
ELECT LOOP 22F BIPOLAR SML (ELECTROSURGICAL) ×4
ELECT REM PT RETURN 9FT ADLT (ELECTROSURGICAL) ×4
ELECTRODE LOOP 22F BIPOLAR SML (ELECTROSURGICAL) IMPLANT
ELECTRODE REM PT RTRN 9FT ADLT (ELECTROSURGICAL) ×3 IMPLANT
GAUZE 4X4 16PLY ~~LOC~~+RFID DBL (SPONGE) ×10 IMPLANT
GLOVE SURG UNDER POLY LF SZ7.5 (GLOVE) ×5 IMPLANT
GOWN STRL REUS W/ TWL LRG LVL3 (GOWN DISPOSABLE) ×6 IMPLANT
GOWN STRL REUS W/ TWL XL LVL3 (GOWN DISPOSABLE) ×3 IMPLANT
GOWN STRL REUS W/TWL LRG LVL3 (GOWN DISPOSABLE) ×8
GOWN STRL REUS W/TWL XL LVL3 (GOWN DISPOSABLE) ×4
IV NS IRRIG 3000ML ARTHROMATIC (IV SOLUTION) ×4 IMPLANT
KIT TURNOVER CYSTO (KITS) ×5 IMPLANT
MANIFOLD NEPTUNE II (INSTRUMENTS) ×3 IMPLANT
NDL SAFETY ECLIPSE 18X1.5 (NEEDLE) ×3 IMPLANT
NEEDLE HYPO 18GX1.5 SHARP (NEEDLE) ×4
PACK CYSTO AR (MISCELLANEOUS) ×5 IMPLANT
SET CYSTO W/LG BORE CLAMP LF (SET/KITS/TRAYS/PACK) ×7 IMPLANT
SURGILUBE 2OZ TUBE FLIPTOP (MISCELLANEOUS) ×5 IMPLANT
WATER STERILE IRR 1000ML POUR (IV SOLUTION) ×3 IMPLANT
WATER STERILE IRR 3000ML UROMA (IV SOLUTION) ×5 IMPLANT
WATER STERILE IRR 500ML POUR (IV SOLUTION) ×5 IMPLANT

## 2021-10-08 NOTE — Op Note (Addendum)
Preoperative diagnosis: Bladder tumor (< 2 cm)  Postoperative diagnosis:  Bladder tumor (< 2 cm)  Procedure:  Cystoscopy Transurethral resection of bladder tumor (< 2 cm)  Surgeon: Nicki Reaper C. Mavrik Bynum, M.D.  Anesthesia: General  Complications: None  Intraoperative findings:  Bladder tumor: Abnormal appearing mucosa with early papillary change left antero-lateral wall just inside left bladder neck at 4 o'clock position measuring ~ 1 cm Cystoscopy: Urethra normal in caliber without stricture.  Prostate with moderate lateral lobe enlargement and mild bladder neck elevation.  Mild-moderate bladder trabeculation.  UOs normal-appearing bilaterally.  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  He received a 6-week course of induction BCG.  Recent surveillance cystoscopy with an area of mucosal abnormality with early papillary change.  After reviewing the management options for treatment, he elected to proceed with the above surgical procedure(s). We have discussed the potential benefits and risks of the procedure, side effects of the proposed treatment, the likelihood of the patient achieving the goals of the procedure, and any potential problems that might occur during the procedure or recuperation. Informed consent has been obtained.  Description of procedure:  The patient was taken to the operating room and general anesthesia was induced.  The patient was placed in the dorsal lithotomy position, prepped and draped in the usual sterile fashion, and preoperative antibiotics were administered. A preoperative time-out was performed.   A 21 French cystoscope with 30 degree lens was lubricated, passed per urethra and advanced proximally under direct vision with findings as described above.  Panendoscopy with 70 degree lens  showed no additional mucosal abnormalities.  The cystoscope was removed and a 24 French continuous-flow resectoscope sheath with obturator was lubricated and passed per urethra.  An Iglesias resectoscope with loop was then placed into the sheath and the tumor was resected in its entirety down to superficial muscle with the bipolar loop.  The specimen was removed via irrigation.  The resection site and surrounding mucosa was then fulgurated with the loop.  A few oozing areas of the bladder neck were also fulgurated.  The bladder mucosa was inspected and hemostasis was noted to be adequate.  The resectoscope was removed.  A Foley catheter was not placed.  After anesthetic reversal he was then transported to the PACU in stable condition.  Plan: He will be contacted with the pathology results and follow-up will be scheduled at that time   Tavaras Goody C. Bernardo Heater,  MD

## 2021-10-08 NOTE — Transfer of Care (Signed)
Immediate Anesthesia Transfer of Care Note  Patient: Brandon Lewis  Procedure(s) Performed: CYSTOSCOPY WITH BLADDER BIOPSY TRANSURETHRAL RESECTION OF BLADDER TUMOR (TURBT)  Patient Location: PACU  Anesthesia Type:General  Level of Consciousness: awake and alert   Airway & Oxygen Therapy: Patient Spontanous Breathing and Patient connected to face mask oxygen  Post-op Assessment: Report given to RN, Post -op Vital signs reviewed and stable and Patient moving all extremities  Post vital signs: Reviewed and stable  Last Vitals:  Vitals Value Taken Time  BP 108/90 10/08/21 1003  Temp 36.4 C 10/08/21 1003  Pulse 81 10/08/21 1008  Resp 17 10/08/21 1008  SpO2 98 % 10/08/21 1008  Vitals shown include unvalidated device data.  Last Pain:  Vitals:   10/08/21 1003  TempSrc:   PainSc: 0-No pain         Complications: No notable events documented.

## 2021-10-08 NOTE — Anesthesia Preprocedure Evaluation (Signed)
Anesthesia Evaluation  Patient identified by MRN, date of birth, ID band Patient awake    Reviewed: Allergy & Precautions, NPO status , Patient's Chart, lab work & pertinent test results, reviewed documented beta blocker date and time   History of Anesthesia Complications Negative for: history of anesthetic complications  Airway Mallampati: II  TM Distance: >3 FB Neck ROM: Full    Dental  (+) Poor Dentition   Pulmonary neg sleep apnea, neg COPD, former smoker,    breath sounds clear to auscultation- rhonchi (-) wheezing      Cardiovascular hypertension, Pt. on medications and Pt. on home beta blockers + CAD, + Past MI, + Cardiac Stents, + CABG and + Peripheral Vascular Disease   Rhythm:Regular Rate:Normal - Systolic murmurs and - Diastolic murmurs    Neuro/Psych neg Seizures  Neuromuscular disease negative psych ROS   GI/Hepatic Neg liver ROS, GERD  Medicated,  Endo/Other  negative endocrine ROSneg diabetes  Renal/GU Renal InsufficiencyRenal disease     Musculoskeletal  (+) Arthritis ,   Abdominal (+) - obese,   Peds  Hematology  (+) anemia ,   Anesthesia Other Findings Anemia  h/o  Aortic atherosclerosis (HCC)   Bladder cancer (HCC)  still receiving treatments  CAD (coronary artery disease)  s/p PCI after presenting with exertional angina (drug eluting stent) Chronic kidney disease  STAGE 3B  Diverticulosis    GERD (gastroesophageal reflux disease) History of kidney stones  h/o HTN (hypertension)    Hx of CABG 09/16/2019 4 vessel; LIMA-LAD, SVG-RCA, SVG-OM1, SVG-D2  Hyperlipidemia    Myocardial infarction ([place]) 09/2019 s/p CABG x 4  Osteoarthritis    PVD (peripheral vascular disease) (HCC)  s/p aortobifemoral bypass, with bilateral SFA occlusion and intermittent claudication   Small bowel obstruction (HCC)  after surgeries  Thoracic ascending aortic aneurysm  Thyroid nodule        Reproductive/Obstetrics                             Anesthesia Physical  Anesthesia Plan  ASA: 3  Anesthesia Plan: General   Post-op Pain Management:    Induction: Intravenous  PONV Risk Score and Plan: 1 and Propofol infusion, Ondansetron and Midazolam  Airway Management Planned: LMA  Additional Equipment:   Intra-op Plan:   Post-operative Plan: Extubation in OR  Informed Consent: I have reviewed the patients History and Physical, chart, labs and discussed the procedure including the risks, benefits and alternatives for the proposed anesthesia with the patient or authorized representative who has indicated his/her understanding and acceptance.     Dental advisory given  Plan Discussed with: CRNA, Anesthesiologist and Surgeon  Anesthesia Plan Comments:        Anesthesia Quick Evaluation

## 2021-10-08 NOTE — H&P (Signed)
Urology H&P   History of Present Illness: Brandon Lewis is a 87 y.o. 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  He completed a 6-week course of induction BCG.  Recent surveillance cystoscopy showed slightly irregular tissue with early papillary change right lateral wall.  He presents today for bladder biopsy/fulguration versus TURBT.  Preoperative urine culture grew low-level Enterococcus consistent with colonization.  He is currently on amoxicillin.   Past Medical History:  Diagnosis Date   Anemia    Aortic atherosclerosis (HCC)    Atrial flutter, paroxysmal ([place])    a.) postoperatively following CABG   Bladder cancer ([place]) 04/2020   BPH (benign prostatic hyperplasia)    CAD (coronary artery disease) 09/12/2019   a.) LHC 09/12/2019: EF 55-60%; 85% m-dLM, 50% pLCx, 85% p-m LAD, 70% pLAD, 45% mLAD, 70% p-mRCA. b.) 4v CABG 09/16/2019.   CKD (chronic kidney disease), stage III (HCC)    Diverticulosis    GERD (gastroesophageal reflux disease)    History of kidney stones    HTN (hypertension)    Hyperlipidemia    Long term current use of antithrombotics/antiplatelets    a.) DAPT therapies (ASA + clopidogrel)   NSTEMI (non-ST elevated myocardial infarction) ([place]) 09/11/2019   a.) LHC 09/12/2019: 85% m-dLM, 50% pLCx, 85% p-m LAD, 70% pLAD, 45% mLAD, 70% p-mRCA; transferred to [facility] consult. Underwent 4v CABG on 09/16/2019.   Osteoarthritis    PVD (peripheral vascular disease) ([place])    a.) s/p aortobifemoral bypass, with bilateral SFA occlusion and intermittent claudication   Right thyroid nodule 09/14/2019   a.) CT 09/14/2019 and 10/16/2020; measured 2.2 cm   S/P CABG x 4 09/16/2019   a.) 4v CABG: LIMA-LAD, SVG-RCA, SVG-OM1, SVG-D1   Small bowel obstruction ([place])    after surgeries   Thoracic ascending aortic aneurysm    a.) measured 4.1 cm by CT on 09/14/2019    Past Surgical History:  Procedure Laterality Date    aorto-bifem  2003   [facility]    CAROTID STENT     [facility]   CATARACT EXTRACTION, BILATERAL  2017   CHOLECYSTECTOMY  2002   COLONOSCOPY     COLONOSCOPY WITH PROPOFOL N/A 07/17/2021   Procedure: COLONOSCOPY WITH PROPOFOL;  Surgeon: [name], MD;  Location: [facility] ENDOSCOPY;  Service: Gastroenterology;  Laterality: N/A;   CORONARY ARTERY BYPASS GRAFT N/A 09/16/2019   Procedure: CORONARY ARTERY BYPASS GRAFTING (CABG) using LIMA to LAD; Endoscopic right saphenous vein harvest to OM1, Diag1, and RCA.;  Surgeon: Wonda Olds, MD;  Location: [facility];  Service: Open Heart Surgery;  Laterality: N/A;   ENDARTERECTOMY FEMORAL Left 03/13/2021   Procedure: ENDARTERECTOMY FEMORAL (GROIN REVISION);  Surgeon: Katha Cabal, MD;  Location: [facility] ORS;  Service: Vascular;  Laterality: Left;   LEFT HEART CATH AND CORONARY ANGIOGRAPHY N/A 09/12/2019   Procedure: LEFT HEART CATH AND CORONARY ANGIOGRAPHY;  Surgeon: Corey Skains, MD;  Location: [facility] CV LAB;  Service: Cardiovascular;  Laterality: N/A;   LITHOTRIPSY     LOWER EXTREMITY ANGIOGRAPHY Left 01/15/2021   Procedure: LOWER EXTREMITY ANGIOGRAPHY;  Surgeon: Katha Cabal, MD;  Location: [facility];  Service: Cardiovascular;  Laterality: Left;   TEE WITHOUT CARDIOVERSION N/A 09/16/2019   Procedure: TRANSESOPHAGEAL ECHOCARDIOGRAM (TEE);  Surgeon: Wonda Olds, MD;  Location: [facility];  Service: Open Heart Surgery;  Laterality: N/A;   TONSILLECTOMY     TRANSURETHRAL RESECTION OF BLADDER TUMOR WITH MITOMYCIN-C  N/A 07/24/2020   Procedure: TRANSURETHRAL RESECTION OF BLADDER TUMOR WITH Gemcitabine;  Surgeon: Abbie Sons, MD;  Location: [facility] ORS;  Service: Urology;  Laterality: N/A;   WISDOM TOOTH EXTRACTION      Home Medications:  Current Meds  Medication Sig   amoxicillin (AMOXIL) 875 MG tablet Take 1 tablet (875 mg total) by mouth every 12 (twelve) hours.   aspirin EC 81 MG tablet Take 81 mg by mouth in the morning. Swallow  whole.   calcium carbonate (TUMS - DOSED IN MG ELEMENTAL CALCIUM) 500 MG chewable tablet Chew 1,000 mg by mouth daily as needed for indigestion or heartburn.   Cholecalciferol (VITAMIN D) 50 MCG (2000 UT) CAPS Take 2,000 Units by mouth in the morning.   clopidogrel (PLAVIX) 75 MG tablet *VIAL ONLY* TAKE ONE TABLET BY MOUTH EVERY DAY   hydrocortisone (ANUSOL-HC) 2.5 % rectal cream Place 1 application rectally daily as needed for hemorrhoids or anal itching.   MAGNESIUM GLYCINATE PO Take 500 mg by mouth every evening.   metoprolol succinate (TOPROL-XL) 50 MG 24 hr tablet Take 1 tablet (50 mg total) by mouth in the morning.   metroNIDAZOLE (METROGEL) 0.75 % gel Apply 1 application topically 2 (two) times daily.   Multiple Vitamin (MULTIVITAMIN WITH MINERALS) TABS tablet Take 1 tablet by mouth daily. Centrum Silver   pantoprazole (PROTONIX) 40 MG tablet Take 40 mg by mouth daily as needed (indigestion/heartburn).   rosuvastatin (CRESTOR) 10 MG tablet Take 1 tablet (10 mg total) by mouth every evening.   sodium chloride (OCEAN) 0.65 % nasal spray Place 1 spray into the nose daily as needed (sinus congestion).   tamsulosin (FLOMAX) 0.4 MG CAPS capsule TAKE 1 CAPSULE BY MOUTH ONCE DAILY (Patient taking differently: Take 0.4 mg by mouth daily after breakfast.)   TURMERIC CURCUMIN PO Take 2 tablets by mouth daily.    Allergies:  Allergies  Allergen Reactions   Cilostazol Other (See Comments)    Bowel upset   Simvastatin Other (See Comments)    myalgia    Family History  Problem Relation Age of Onset   Diabetes Father        DM - and brother    Stroke Father    Coronary artery disease Paternal Grandfather    Colon cancer Neg Hx    Prostate cancer Neg Hx     Social History:  reports that he quit smoking about 28 years ago. His smoking use included cigarettes. He has a 20.00 pack-year smoking history. He has never used smokeless tobacco. He reports current alcohol use. He reports that he does  not use drugs.  ROS: Noncontributory  Physical Exam:  Vital signs in last 24 hours: Temp:  [97.7 F (36.5 C)] 97.7 F (36.5 C) (12/20 0712) Pulse Rate:  [102] 102 (12/20 0712) Resp:  [20] 20 (12/20 0712) BP: (127)/(63) 127/63 (12/20 0712) SpO2:  [100 %] 100 % (12/20 0712) Weight:  [92.1 kg] 92.1 kg (12/20 0712) Constitutional:  Alert and oriented, No acute distress HEENT: [place] AT, moist mucus membranes.  Trachea midline, no masses Cardiovascular: Regular rate and rhythm, no clubbing, cyanosis, or edema. Respiratory: Normal respiratory effort, lungs clear bilaterally Psychiatric: Normal mood and affect   Laboratory Data:  No results for input(s): WBC, HGB, HCT in the last 72 hours. No results for input(s): NA, K, CL, CO2, GLUCOSE, BUN, CREATININE, CALCIUM in the last 72 hours. No results for input(s): LABPT, INR in the last 72 hours. No results for input(s): LABURIN  in the last 72 hours. Results for orders placed or performed in visit on 09/30/21  Microscopic Examination     Status: Abnormal   Collection Time: 09/30/21 11:34 AM   Urine  Result Value Ref Range Status   WBC, UA 0-5 0 - 5 /hpf Final   RBC None seen 0 - 2 /hpf Final   Epithelial Cells (non renal) 0-10 0 - 10 /hpf Final   Mucus, UA Present (A) Not Estab. Final   Bacteria, UA Few (A) None seen/Few Final  CULTURE, URINE COMPREHENSIVE     Status: Abnormal   Collection Time: 09/30/21 11:51 AM   Specimen: Urine   UR  Result Value Ref Range Status   Urine Culture, Comprehensive Final report (A)  Final   Organism ID, Bacteria Enterococcus faecalis (A)  Final    Comment: For Enterococcus species, aminoglycosides (except for high-level resistance screening), cephalosporins, clindamycin, and trimethoprim-sulfamethoxazole are not effective clinically. (CLSI, M100-S26, 2016) 10,000-25,000 colony forming units per mL    ANTIMICROBIAL SUSCEPTIBILITY Comment  Final    Comment:       ** S = Susceptible; I = Intermediate; R  = Resistant **                    P = Positive; N = Negative             MICS are expressed in micrograms per mL    Antibiotic                 RSLT#1    RSLT#2    RSLT#3    RSLT#4 Ciprofloxacin                  S Levofloxacin                   S Nitrofurantoin                 S Penicillin                     S Tetracycline                   R Vancomycin                     S      Impression/Assessment:  History high-grade noninvasive urothelial carcinoma the bladder with recent abnormal surveillance cystoscopy.  Plan:  Bladder biopsy/fulguration versus TURBT.  The procedure has been discussed in detail including potential risks of bleeding, infection, urethral stricture and bladder injury.  All questions were answered and he desires to proceed.   10/08/2021, 8:58 AM  John Giovanni,  MD

## 2021-10-08 NOTE — Anesthesia Postprocedure Evaluation (Signed)
Anesthesia Post Note  Patient: Brandon Lewis  Procedure(s) Performed: CYSTOSCOPY WITH BLADDER BIOPSY TRANSURETHRAL RESECTION OF BLADDER TUMOR (TURBT)  Patient location during evaluation: PACU Anesthesia Type: General Level of consciousness: awake and alert, awake and oriented Pain management: pain level controlled Vital Signs Assessment: post-procedure vital signs reviewed and stable Respiratory status: spontaneous breathing, nonlabored ventilation and respiratory function stable Cardiovascular status: blood pressure returned to baseline and stable Postop Assessment: no apparent nausea or vomiting Anesthetic complications: no   No notable events documented.   Last Vitals:  Vitals:   10/08/21 1030 10/08/21 1037  BP: 129/69 131/65  Pulse: 81 78  Resp: 16 15  Temp: (!) 36.3 C (!) 36.1 C  SpO2: 99% 97%    Last Pain:  Vitals:   10/08/21 1037  TempSrc: Temporal  PainSc: 0-No pain                 Phill Mutter

## 2021-10-08 NOTE — Interval H&P Note (Signed)
History and Physical Interval Note:  10/08/2021 9:02 AM  Brandon Lewis  has presented today for surgery, with the diagnosis of Bladder Lesion.  The various methods of treatment have been discussed with the patient and family. After consideration of risks, benefits and other options for treatment, the patient has consented to  Procedure(s): CYSTOSCOPY WITH BLADDER BIOPSY (N/A) as a surgical intervention.  The patient's history has been reviewed, patient examined, no change in status, stable for surgery.  I have reviewed the patient's chart and labs.  Questions were answered to the patient's satisfaction.     [place]

## 2021-10-08 NOTE — Discharge Instructions (Addendum)
Transurethral Resection of Bladder Tumor (TURBT) or Bladder Biopsy   Definition:  Transurethral Resection of the Bladder Tumor is a surgical procedure used to diagnose and remove tumors within the bladder.   General instructions:     Your recent bladder surgery requires very little post hospital care but some definite precautions.  Despite the fact that no skin incisions were used, the area around the bladder incisions are raw and covered with scabs to promote healing and prevent bleeding. Certain precautions are needed to insure that the scabs are not disturbed over the next 2-4 weeks while the healing proceeds.  Because the raw surface inside your bladder and the irritating effects of urine you may expect frequency of urination and/or urgency (a stronger desire to urinate) and perhaps even getting up at night more often. This will usually resolve or improve slowly over the healing period. You may see some blood in your urine over the first 6 weeks. Do not be alarmed, even if the urine was clear for a while. Get off your feet and drink lots of fluids until clearing occurs. If you start to pass clots or don't improve call us.  Diet:  You may return to your normal diet immediately. Because of the raw surface of your bladder, alcohol, spicy foods, foods high in acid and drinks with caffeine may cause irritation or frequency and should be used in moderation. To keep your urine flowing freely and avoid constipation, drink plenty of fluids during the day (8-10 glasses). Tip: Avoid cranberry juice because it is very acidic.  Activity:  Your physical activity doesn't need to be restricted. However, if you are very active, you may see some blood in the urine. We suggest that you reduce your activity under the circumstances until the bleeding has stopped.  Bowels:  It is important to keep your bowels regular during the postoperative period. Straining with bowel movements can cause bleeding. A bowel  movement every other day is reasonable. Use a mild laxative if needed, such as milk of magnesia 2-3 tablespoons, or 2 Dulcolax tablets. Call if you continue to have problems. If you had been taking narcotics for pain, before, during or after your surgery, you may be constipated. Take a laxative if necessary.    Medication:  You should resume your pre-surgery medications unless told not to. In addition you may be given an antibiotic to prevent or treat infection. Antibiotics are not always necessary. All medication should be taken as prescribed until the bottles are finished unless you are having an unusual reaction to one of the drugs.  Follow-up:  You will be contacted with your pathology results and follow-up appointments will be scheduled at that time.  [facility] [address] [place], [place] [address] [phone]  AMBULATORY SURGERY  DISCHARGE INSTRUCTIONS   The drugs that you were given will stay in your system until tomorrow so for the next 24 hours you should not:  Drive an automobile Make any legal decisions Drink any alcoholic beverage   You may resume regular meals tomorrow.  Today it is better to start with liquids and gradually work up to solid foods.  You may eat anything you prefer, but it is better to start with liquids, then soup and crackers, and gradually work up to solid foods.   Please notify your doctor immediately if you have any unusual bleeding, trouble breathing, redness and pain at the surgery site, drainage, fever, or pain not relieved by medication.  Additional Instructions:     Please contact your physician with any problems or Same Day Surgery at [phone], Monday through Friday 6 am to 4 pm, or [place] at [facility] number at [phone].

## 2021-10-08 NOTE — Anesthesia Procedure Notes (Signed)
Procedure Name: LMA Insertion Date/Time: 10/08/2021 9:21 AM Performed by: Esaw Grandchild, CRNA Pre-anesthesia Checklist: Patient identified, Emergency Drugs available, Suction available and Patient being monitored Patient Re-evaluated:Patient Re-evaluated prior to induction Oxygen Delivery Method: Circle system utilized Preoxygenation: Pre-oxygenation with 100% oxygen Induction Type: IV induction Ventilation: Mask ventilation without difficulty LMA: LMA inserted LMA Size: 5.0 Number of attempts: 2 Placement Confirmation: positive ETCO2 and breath sounds checked- equal and bilateral Tube secured with: Tape Dental Injury: Teeth and Oropharynx as per pre-operative assessment  Comments: 1st attempt w/LMA 4.0 leaking w/breaths, 2nd attempt w/LMA 5.0 no leaking present

## 2021-10-09 LAB — SURGICAL PATHOLOGY

## 2021-10-10 ENCOUNTER — Encounter (INDEPENDENT_AMBULATORY_CARE_PROVIDER_SITE_OTHER): Payer: Self-pay | Admitting: Nurse Practitioner

## 2021-10-10 ENCOUNTER — Ambulatory Visit (INDEPENDENT_AMBULATORY_CARE_PROVIDER_SITE_OTHER): Payer: Medicare PPO | Admitting: Nurse Practitioner

## 2021-10-10 VITALS — BP 167/72 | HR 91 | Resp 16 | Wt 204.6 lb

## 2021-10-10 DIAGNOSIS — R609 Edema, unspecified: Secondary | ICD-10-CM

## 2021-10-10 NOTE — Progress Notes (Signed)
Patient came in for left unna wrap. Today the patient was taking out of wraps and will follow up in 4 weeks see Arna Medici or [employer]

## 2021-10-11 ENCOUNTER — Ambulatory Visit (INDEPENDENT_AMBULATORY_CARE_PROVIDER_SITE_OTHER): Payer: Medicare PPO | Admitting: Nurse Practitioner

## 2021-10-11 ENCOUNTER — Telehealth: Payer: Self-pay | Admitting: Urology

## 2021-10-11 ENCOUNTER — Encounter (INDEPENDENT_AMBULATORY_CARE_PROVIDER_SITE_OTHER): Payer: Self-pay | Admitting: Nurse Practitioner

## 2021-10-11 NOTE — Telephone Encounter (Signed)
I contacted Brandon Lewis regarding his bladder pathology report.  He had no postoperative problems.  The resected area was a high-grade urothelial carcinoma the bladder.  The report states no muscle was seen however the small tumor was completely resected and there is no clinical suspicion of muscle invasion.  We discussed reinduction BCG x6 weeks and he would like to proceed.

## 2021-10-24 ENCOUNTER — Ambulatory Visit (INDEPENDENT_AMBULATORY_CARE_PROVIDER_SITE_OTHER): Payer: Medicare PPO | Admitting: Urology

## 2021-10-24 ENCOUNTER — Encounter: Payer: Self-pay | Admitting: Urology

## 2021-10-24 ENCOUNTER — Other Ambulatory Visit: Payer: Self-pay

## 2021-10-24 VITALS — BP 119/72 | HR 94 | Ht 70.0 in | Wt 200.0 lb

## 2021-10-24 DIAGNOSIS — C679 Malignant neoplasm of bladder, unspecified: Secondary | ICD-10-CM

## 2021-10-24 NOTE — Progress Notes (Signed)
10/24/2021 1:03 PM   Brandon Lewis 07-27-1934 [id number]  Referring provider: Venia Carbon, MD [place],  [place] [address]  Chief Complaint  Patient presents with   Follow-up    HPI: 88 y.o. male presents for postop follow-up.  TURBT 10/08/2021 for bladder lesion with early papillary change He was contacted by phone postop regarding his path results which was Ta high-grade urothelial carcinoma BCG reinduction x6 weeks was recommended and he is awaiting scheduling Expected postop frequency and urgency which is improving   PMH: Past Medical History:  Diagnosis Date   Anemia    Aortic atherosclerosis (HCC)    Atrial flutter, paroxysmal ([place])    a.) postoperatively following CABG   Bladder cancer ([place]) 04/2020   BPH (benign prostatic hyperplasia)    CAD (coronary artery disease) 09/12/2019   a.) LHC 09/12/2019: EF 55-60%; 85% m-dLM, 50% pLCx, 85% p-m LAD, 70% pLAD, 45% mLAD, 70% p-mRCA. b.) 4v CABG 09/16/2019.   CKD (chronic kidney disease), stage III (HCC)    Diverticulosis    GERD (gastroesophageal reflux disease)    History of kidney stones    HTN (hypertension)    Hyperlipidemia    Long term current use of antithrombotics/antiplatelets    a.) DAPT therapies (ASA + clopidogrel)   NSTEMI (non-ST elevated myocardial infarction) ([place]) 09/11/2019   a.) LHC 09/12/2019: 85% m-dLM, 50% pLCx, 85% p-m LAD, 70% pLAD, 45% mLAD, 70% p-mRCA; transferred to [facility] for [facility] consult. Underwent 4v CABG on 09/16/2019.   Osteoarthritis    PVD (peripheral vascular disease) ([place])    a.) s/p aortobifemoral bypass, with bilateral SFA occlusion and intermittent claudication   Right thyroid nodule 09/14/2019   a.) CT 09/14/2019 and 10/16/2020; measured 2.2 cm   S/P CABG x 4 09/16/2019   a.) 4v CABG: LIMA-LAD, SVG-RCA, SVG-OM1, SVG-D1   Small bowel obstruction ([place])    after surgeries   Thoracic ascending aortic aneurysm    a.) measured 4.1 cm by CT on  09/14/2019    Surgical History: Past Surgical History:  Procedure Laterality Date   aorto-bifem  2003   [facility]    CAROTID STENT     [facility]   CATARACT EXTRACTION, BILATERAL  2017   CHOLECYSTECTOMY  2002   COLONOSCOPY     COLONOSCOPY WITH PROPOFOL N/A 07/17/2021   Procedure: COLONOSCOPY WITH PROPOFOL;  Surgeon: [name], MD;  Location: [facility] END[facility]COPY;  Service: Gastroenterology;  Laterality: N/A;   CORONARY ARTERY BYPASS GRAFT N/A 09/16/2019   Procedure: CORONARY ARTERY BYPASS GRAFTING (CABG) using LIMA to LAD; Endoscopic right saphenous vein harvest to OM1, Diag1, and RCA.;  Surgeon: Wonda Olds, MD;  Location: [facility];  Service: Open Heart Surgery;  Laterality: N/A;   CYSTOSCOPY WITH BIOPSY N/A 10/08/2021   Procedure: CYSTOSCOPY WITH BLADDER BIOPSY;  Surgeon: Abbie Sons, MD;  Location: [facility] ORS;  Service: Urology;  Laterality: N/A;   ENDARTERECTOMY FEMORAL Left 03/13/2021   Procedure: ENDARTERECTOMY FEMORAL (GROIN REVISION);  Surgeon: Katha Cabal, MD;  Location: [facility] ORS;  Service: Vascular;  Laterality: Left;   LEFT HEART CATH AND CORONARY ANGIOGRAPHY N/A 09/12/2019   Procedure: LEFT HEART CATH AND CORONARY ANGIOGRAPHY;  Surgeon: Corey Skains, MD;  Location: [facility] LAB;  Service: Cardiovascular;  Laterality: N/A;   LITHOTRIPSY     LOWER EXTREMITY ANGIOGRAPHY Left 01/15/2021   Procedure: LOWER EXTREMITY ANGIOGRAPHY;  Surgeon: Katha Cabal, MD;  Location: [facility];  Service: Cardiovascular;  Laterality: Left;   TEE WITHOUT CARDIOVERSION N/A 09/16/2019   Procedure: TRANSESOPHAGEAL ECHOCARDIOGRAM (TEE);  Surgeon: Wonda Olds, MD;  Location: [facility];  Service: Open Heart Surgery;  Laterality: N/A;   TONSILLECTOMY     TRANSURETHRAL RESECTION OF BLADDER TUMOR  10/08/2021   Procedure: TRANSURETHRAL RESECTION OF BLADDER TUMOR (TURBT);  Surgeon: Abbie Sons, MD;  Location: [facility] ORS;  Service: Urology;;   TRANSURETHRAL RESECTION OF  BLADDER TUMOR WITH MITOMYCIN-C N/A 07/24/2020   Procedure: TRANSURETHRAL RESECTION OF BLADDER TUMOR WITH Gemcitabine;  Surgeon: Abbie Sons, MD;  Location: [facility] ORS;  Service: Urology;  Laterality: N/A;   WISDOM TOOTH EXTRACTION      Home Medications:  Allergies as of 10/24/2021       Reactions   Cilostazol Other (See Comments)   Bowel upset   Simvastatin Other (See Comments)   myalgia        Medication List        Accurate as of October 24, 2021  1:03 PM. If you have any questions, ask your nurse or doctor.          STOP taking these medications    amoxicillin 875 MG tablet Commonly known as: AMOXIL Stopped by: Abbie Sons, MD       TAKE these medications    aspirin EC 81 MG tablet Take 81 mg by mouth in the morning. Swallow whole.   calcium carbonate 500 MG chewable tablet Commonly known as: TUMS - dosed in mg elemental calcium Chew 1,000 mg by mouth daily as needed for indigestion or heartburn.   clopidogrel 75 MG tablet Commonly known as: PLAVIX *VIAL ONLY* TAKE ONE TABLET BY MOUTH EVERY DAY   hydrocortisone 2.5 % rectal cream Commonly known as: ANUSOL-HC Place 1 application rectally daily as needed for hemorrhoids or anal itching.   MAGNESIUM GLYCINATE PO Take 500 mg by mouth every evening.   metoprolol succinate 50 MG 24 hr tablet Commonly known as: TOPROL-XL Take 1 tablet (50 mg total) by mouth in the morning.   metroNIDAZOLE 0.75 % gel Commonly known as: METROGEL Apply 1 application topically 2 (two) times daily.   multivitamin with minerals Tabs tablet Take 1 tablet by mouth daily. Centrum Silver   pantoprazole 40 MG tablet Commonly known as: PROTONIX Take 40 mg by mouth daily as needed (indigestion/heartburn).   rosuvastatin 10 MG tablet Commonly known as: CRESTOR Take 1 tablet (10 mg total) by mouth every evening.   sodium chloride 0.65 % nasal spray Commonly known as: OCEAN Place 1 spray into the nose daily as needed (sinus  congestion).   tamsulosin 0.4 MG Caps capsule Commonly known as: FLOMAX TAKE 1 CAPSULE BY MOUTH ONCE DAILY What changed: when to take this   TURMERIC CURCUMIN PO Take 2 tablets by mouth daily.   Vitamin D 50 MCG (2000 UT) Caps Take 2,000 Units by mouth in the morning.        Allergies:  Allergies  Allergen Reactions   Cilostazol Other (See Comments)    Bowel upset   Simvastatin Other (See Comments)    myalgia    Family History: Family History  Problem Relation Age of Onset   Diabetes Father        DM - and brother    Stroke Father    Coronary artery disease Paternal Grandfather    Colon cancer Neg Hx    Prostate cancer Neg Hx     Social History:  reports that he quit smoking about  29 years ago. His smoking use included cigarettes. He has a 20.00 pack-year smoking history. He has never used smokeless tobacco. He reports current alcohol use. He reports that he does not use drugs.   Physical Exam: BP 119/72    Pulse 94    Ht 5\' 10"  (1.778 m)    Wt 200 lb (90.7 kg)    BMI 28.70 kg/m   Constitutional:  Alert and oriented, No acute distress.   Assessment & Plan:    1.  Recurrent high-grade urothelial carcinoma the bladder Reinduction BCG has previously been discussed and he is awaiting scheduling All questions were answered and he desires to proceed   Abbie Sons, [place] [address] [place], [place] [address] [phone]

## 2021-10-31 NOTE — Telephone Encounter (Signed)
BCG shipment received.  Left message for patient to return my call to schedule BCG treatment appointments.

## 2021-11-08 ENCOUNTER — Other Ambulatory Visit: Payer: Self-pay

## 2021-11-08 ENCOUNTER — Ambulatory Visit: Payer: Medicare PPO

## 2021-11-08 DIAGNOSIS — C679 Malignant neoplasm of bladder, unspecified: Secondary | ICD-10-CM | POA: Diagnosis not present

## 2021-11-08 LAB — MICROSCOPIC EXAMINATION: Bacteria, UA: NONE SEEN

## 2021-11-08 LAB — URINALYSIS, COMPLETE
Bilirubin, UA: NEGATIVE
Glucose, UA: NEGATIVE
Ketones, UA: NEGATIVE
Leukocytes,UA: NEGATIVE
Nitrite, UA: NEGATIVE
Specific Gravity, UA: 1.02 (ref 1.005–1.030)
Urobilinogen, Ur: 0.2 mg/dL (ref 0.2–1.0)
pH, UA: 7 (ref 5.0–7.5)

## 2021-11-08 MED ORDER — BCG LIVE 50 MG IS SUSR
3.2400 mL | Freq: Once | INTRAVESICAL | Status: AC
  Administered 2021-11-08: 81 mg via INTRAVESICAL

## 2021-11-08 NOTE — Progress Notes (Signed)
BCG Bladder Instillation  BCG # 1  Due to Bladder Cancer patient is present today for a BCG treatment. Patient was cleaned and prepped in a sterile fashion with betadine. A 14FR catheter was inserted, urine return was noted 5ml, urine was dark yellow in color. 34ml of reconstituted BCG was instilled into the bladder. The catheter was then removed. Patient tolerated well, no complications were noted.  Performed by: Edwin Dada, [place], CMA   Follow up/ Additional notes: RTC in 1 week for BCG

## 2021-11-08 NOTE — Patient Instructions (Signed)

## 2021-11-15 ENCOUNTER — Ambulatory Visit (INDEPENDENT_AMBULATORY_CARE_PROVIDER_SITE_OTHER): Payer: Medicare PPO

## 2021-11-15 ENCOUNTER — Other Ambulatory Visit: Payer: Self-pay

## 2021-11-15 ENCOUNTER — Ambulatory Visit (INDEPENDENT_AMBULATORY_CARE_PROVIDER_SITE_OTHER): Payer: Medicare PPO | Admitting: Nurse Practitioner

## 2021-11-15 DIAGNOSIS — C679 Malignant neoplasm of bladder, unspecified: Secondary | ICD-10-CM

## 2021-11-15 LAB — URINALYSIS, COMPLETE
Bilirubin, UA: NEGATIVE
Glucose, UA: NEGATIVE
Ketones, UA: NEGATIVE
Leukocytes,UA: NEGATIVE
Nitrite, UA: NEGATIVE
Protein,UA: NEGATIVE
RBC, UA: NEGATIVE
Specific Gravity, UA: 1.02 (ref 1.005–1.030)
Urobilinogen, Ur: 0.2 mg/dL (ref 0.2–1.0)
pH, UA: 5.5 (ref 5.0–7.5)

## 2021-11-15 LAB — MICROSCOPIC EXAMINATION
Bacteria, UA: NONE SEEN
Epithelial Cells (non renal): NONE SEEN /hpf (ref 0–10)

## 2021-11-15 MED ORDER — BCG LIVE 50 MG IS SUSR
3.2400 mL | Freq: Once | INTRAVESICAL | Status: AC
  Administered 2021-11-15: 81 mg via INTRAVESICAL

## 2021-11-15 NOTE — Progress Notes (Signed)
BCG Bladder Instillation  BCG # Reinduction #2   Due to Bladder Cancer patient is present today for a BCG treatment. Patient was cleaned and prepped in a sterile fashion with betadine. A 14FR catheter was inserted, urine return was noted 33ml, urine was pale yellow in color. 78ml of reconstituted BCG was instilled into the bladder. The catheter was then removed. Patient tolerated well, no complications were noted.  Performed by: Edwin Dada, [place], CMA  Follow up/ Additional notes: RTC in 1 week as scheduled.

## 2021-11-18 ENCOUNTER — Other Ambulatory Visit: Payer: Self-pay

## 2021-11-18 ENCOUNTER — Encounter (INDEPENDENT_AMBULATORY_CARE_PROVIDER_SITE_OTHER): Payer: Self-pay | Admitting: Nurse Practitioner

## 2021-11-18 ENCOUNTER — Ambulatory Visit (INDEPENDENT_AMBULATORY_CARE_PROVIDER_SITE_OTHER): Payer: Medicare PPO | Admitting: Nurse Practitioner

## 2021-11-18 ENCOUNTER — Emergency Department
Admission: EM | Admit: 2021-11-18 | Discharge: 2021-11-18 | Disposition: A | Discharge status: Home / Self Care | Payer: Medicare PPO | Attending: Emergency Medicine | Admitting: Emergency Medicine

## 2021-11-18 ENCOUNTER — Emergency Department: Payer: Medicare PPO

## 2021-11-18 VITALS — BP 112/66 | HR 84 | Resp 16 | Ht 70.0 in | Wt 204.0 lb

## 2021-11-18 DIAGNOSIS — M549 Dorsalgia, unspecified: Secondary | ICD-10-CM | POA: Diagnosis present

## 2021-11-18 DIAGNOSIS — Z7902 Long term (current) use of antithrombotics/antiplatelets: Secondary | ICD-10-CM | POA: Diagnosis not present

## 2021-11-18 DIAGNOSIS — R1031 Right lower quadrant pain: Secondary | ICD-10-CM | POA: Diagnosis not present

## 2021-11-18 DIAGNOSIS — M79651 Pain in right thigh: Secondary | ICD-10-CM | POA: Insufficient documentation

## 2021-11-18 DIAGNOSIS — I890 Lymphedema, not elsewhere classified: Secondary | ICD-10-CM | POA: Diagnosis not present

## 2021-11-18 DIAGNOSIS — Z7982 Long term (current) use of aspirin: Secondary | ICD-10-CM | POA: Insufficient documentation

## 2021-11-18 DIAGNOSIS — I129 Hypertensive chronic kidney disease with stage 1 through stage 4 chronic kidney disease, or unspecified chronic kidney disease: Secondary | ICD-10-CM | POA: Diagnosis not present

## 2021-11-18 DIAGNOSIS — M79604 Pain in right leg: Secondary | ICD-10-CM | POA: Diagnosis not present

## 2021-11-18 DIAGNOSIS — M48061 Spinal stenosis, lumbar region without neurogenic claudication: Secondary | ICD-10-CM | POA: Diagnosis not present

## 2021-11-18 DIAGNOSIS — M7989 Other specified soft tissue disorders: Secondary | ICD-10-CM | POA: Diagnosis not present

## 2021-11-18 DIAGNOSIS — N189 Chronic kidney disease, unspecified: Secondary | ICD-10-CM | POA: Diagnosis not present

## 2021-11-18 DIAGNOSIS — I70212 Atherosclerosis of native arteries of extremities with intermittent claudication, left leg: Secondary | ICD-10-CM | POA: Diagnosis not present

## 2021-11-18 MED ORDER — PREDNISONE 10 MG PO TABS: 10.0000 mg | ORAL_TABLET | Freq: Every day | ORAL | 0 refills | Status: DC

## 2021-11-18 NOTE — ED Provider Triage Note (Signed)
Emergency Medicine Provider Triage Evaluation Note  Brandon Lewis , a 88 y.o. male  was evaluated in triage.  Pt complains of right thigh pain that started suddenly this afternoon. Pain was very sharp and severe. Not present at this time. Patient concerned for DVT. He is on Plavix.  Review of Systems  Positive: Right thigh pain Negative: Fever  Physical Exam  There were no vitals taken for this visit. Gen:   Awake, no distress   Resp:  Normal effort  MSK:   Moves extremities without difficulty  Other:    Medical Decision Making  Medically screening exam initiated at 5:30 PM.  Appropriate orders placed.  Brandon Lewis was informed that the remainder of the evaluation will be completed by another provider, this initial triage assessment does not replace that evaluation, and the importance of remaining in the ED until their evaluation is complete.   Brandon Lewis, Brandon Lewis 11/18/21 1734

## 2021-11-18 NOTE — ED Provider Notes (Signed)
[facility] EMERGENCY DEPARTMENT Provider Note   CSN: [id number] Arrival date & time: 11/18/21  1700     History  Chief Complaint  Patient presents with   Groin Pain    Brandon Lewis is a 88 y.o. male.  With a history of lumbar spinal stenosis, hypertension, chronic kidney disease, atrial flutter presents to the emergency department for evaluation of acute right groin pain.  Patient describes a sharp pain in his groin and inner thigh with sitting.  No falls trauma or injury.  He got up and ambulated immediately after the pain and the pain resolved.  Patient states he sat back down and the pain returned.  He was concerned about a possible blood clots as he came to the emergency department for evaluation.  No weakness or loss of bowel or bladder symptoms.  Currently able to ambulate with no antalgic gait.  Denies any recent falls trauma or injury.  HPI     Home Medications Prior to Admission medications   Medication Sig Start Date End Date Taking? Authorizing Provider  predniSONE (DELTASONE) 10 MG tablet Take 1 tablet (10 mg total) by mouth daily. 6,5,4,3,2,1 six day taper 11/18/21  Yes Renata Caprice  aspirin EC 81 MG tablet Take 81 mg by mouth in the morning. Swallow whole.    [provider]  calcium carbonate (TUMS - DOSED IN MG ELEMENTAL CALCIUM) 500 MG chewable tablet Chew 1,000 mg by mouth daily as needed for indigestion or heartburn.    [provider]  Cholecalciferol (VITAMIN D) 50 MCG (2000 UT) CAPS Take 2,000 Units by mouth in the morning.    [provider]  clopidogrel (PLAVIX) 75 MG tablet *VIAL ONLY* TAKE ONE TABLET BY MOUTH EVERY DAY 03/13/21   Venia Carbon, MD  hydrocortisone (ANUSOL-HC) 2.5 % rectal cream Place 1 application rectally daily as needed for hemorrhoids or anal itching.    [provider]  MAGNESIUM GLYCINATE PO Take 500 mg by mouth every evening.    [provider]   metoprolol succinate (TOPROL-XL) 50 MG 24 hr tablet Take 1 tablet (50 mg total) by mouth in the morning. 03/12/21   Venia Carbon, MD  metroNIDAZOLE (METROGEL) 0.75 % gel Apply 1 application topically 2 (two) times daily. 05/22/21   Venia Carbon, MD  Multiple Vitamin (MULTIVITAMIN WITH MINERALS) TABS tablet Take 1 tablet by mouth daily. Centrum Silver    [provider]  pantoprazole (PROTONIX) 40 MG tablet Take 40 mg by mouth daily as needed (indigestion/heartburn).    [provider]  rosuvastatin (CRESTOR) 10 MG tablet Take 1 tablet (10 mg total) by mouth every evening. 03/12/21   Venia Carbon, MD  sodium chloride (OCEAN) 0.65 % nasal spray Place 1 spray into the nose daily as needed (sinus congestion).    [provider]  tamsulosin (FLOMAX) 0.4 MG CAPS capsule TAKE 1 CAPSULE BY MOUTH ONCE DAILY Patient taking differently: Take 0.4 mg by mouth daily after breakfast. 01/31/21   Venia Carbon, MD  TURMERIC CURCUMIN PO Take 2 tablets by mouth daily.    [provider]      Allergies    Cilostazol and Simvastatin    Review of Systems   Review of Systems  Physical Exam Updated Vital Signs BP (!) 142/73    Pulse 84    Temp 98.1 F (36.7 C) (Oral)    Resp 17    Ht 5\' 10"  (1.778 m)  SpO2 97%    BMI 29.27 kg/m  Physical Exam Constitutional:      Appearance: He is well-developed.  HENT:     Head: Normocephalic and atraumatic.  Eyes:     Conjunctiva/sclera: Conjunctivae normal.  Cardiovascular:     Rate and Rhythm: Normal rate.  Pulmonary:     Effort: Pulmonary effort is normal. No respiratory distress.  Musculoskeletal:     Cervical back: Normal range of motion.     Comments: Lumbar spine no spinous process tenderness.  No sacral or SI joint tenderness.  Full range of motion of the right hip with no discomfort.  Neurovascular intact in bilateral lower extremities.  No clonus noted.  No swelling or edema throughout both legs with 2+  dorsalis pedis pulse no warmth or redness.  Skin:    General: Skin is warm.     Findings: No rash.  Neurological:     Mental Status: He is alert and oriented to person, place, and time.  Psychiatric:        Behavior: Behavior normal.        Thought Content: Thought content normal.    ED Results / Procedures / Treatments   Labs (all labs ordered are listed, but only abnormal results are displayed) Labs Reviewed - No data to display  EKG None  Radiology US Venous Img Lower Unilateral Right  Result Date: 11/18/2021 CLINICAL DATA:  Pain and swelling EXAM: Right LOWER EXTREMITY VENOUS DOPPLER ULTRASOUND TECHNIQUE: Gray-scale sonography with compression, as well as color and duplex ultrasound, were performed to evaluate the deep venous system(s) from the level of the common femoral vein through the popliteal and proximal calf veins. COMPARISON:  None. FINDINGS: VENOUS Normal compressibility of the common femoral, superficial femoral, and popliteal veins, as well as the visualized calf veins. Visualized portions of profunda femoral vein and great saphenous vein unremarkable. No filling defects to suggest DVT on grayscale or color Doppler imaging. Doppler waveforms show normal direction of venous flow, normal respiratory plasticity and response to augmentation. Limited views of the contralateral common femoral vein are unremarkable. OTHER None. Limitations: none IMPRESSION: There is no evidence of deep venous thrombosis in the right lower extremity. Electronically Signed   By: Elmer Picker M.D.   On: 11/18/2021 18:33     EXAM: MRI LUMBAR SPINE WITHOUT CONTRAST   TECHNIQUE: Multiplanar, multisequence MR imaging of the lumbar spine was performed. No intravenous contrast was administered.   COMPARISON:  None.   FINDINGS: Segmentation: Lumbar segmentation appears to be normal and will be designated as such for this report.   Alignment: Moderate dextroconvex thoracolumbar junction  level scoliosis. Mild retrolisthesis at L1-L2 and L2-L3. Straightening of lower lumbar lordosis.   Vertebrae: Interbody ankylosis at T12-L1. Mildly heterogeneous bone marrow signal with no marrow edema or evidence of acute osseous abnormality. Intact visible sacrum and SI joints.   Conus medullaris and cauda equina: Conus extends to the T12-L1 level. No lower spinal cord or conus signal abnormality.   Paraspinal and other soft tissues: Evidence of aortoiliac bifurcation thrombosis and vascular bypass. Negative visible abdominal viscera. Negative visualized posterior paraspinal soft tissues.   Disc levels:   T11-T12: Circumferential disc bulge with endplate spurring and mild to moderate facet hypertrophy. No spinal stenosis. Mild right T11 foraminal stenosis.   T12-L1:  Ankylosis.  Mild osseous left T12 foraminal stenosis.   L1-L2: Disc space loss with mild retrolisthesis and left eccentric circumferential disc bulge with endplate spurring. No spinal stenosis. Severe left L1  foraminal stenosis.   L2-L3: Disc space loss with left eccentric circumferential disc osteophyte complex and moderate posterior element hypertrophy. Mild spinal stenosis with moderate left lateral recess and severe left L2 foraminal stenosis. Mild right L2 foraminal stenosis.   L3-L4: Circumferential disc bulge with severe facet and ligament flavum hypertrophy. Severe spinal stenosis best demonstrated on series 5, image 11. Moderate left greater than right lateral recess stenosis (descending L4 nerve levels). Moderate to severe bilateral L3 foraminal stenosis.   L4-L5: Right eccentric circumferential disc bulge with endplate spurring and moderate to severe posterior element hypertrophy. Moderate to severe spinal stenosis with right greater than left lateral recess stenosis (L5 nerve levels). Severe right L4 foraminal stenosis.   L5-S1: Disc space loss with right eccentric circumferential  disc osteophyte complex and moderate posterior element hypertrophy. No spinal stenosis, but moderate to severe right lateral recess (right S1) and right L5 neural foraminal stenosis.   IMPRESSION: 1. No acute osseous abnormality in the lumbar spine. Chronic T12-L1 ankylosis. 2. Multifactorial moderate or severe spinal, bilateral lateral recess, and foraminal stenosis at L3-L4 and L4-L5. 3. Multifactorial moderate to severe right lateral recess and right foraminal stenosis at L5-S1. 4. L1-L2 degeneration with severe left L1 foraminal stenosis but no spinal stenosis.    Procedures Procedures    Medications Ordered in ED Medications - No data to display  ED Course/ Medical Decision Making/ A&P                           Medical Decision Making Risk Prescription drug management.  88 year old male with right groin and thigh pain.  His hip moves well on exam with no pain or discomfort.  No falls trauma or injury.  He is ambulatory currently with no pain or discomfort.  He is concerned about a DVT, ultrasound obtained and reviewed by me today showing no evidence of acute DVT.  His history and exam findings indicate no hip issues.  Did review his chart and imaging of his lumbar spine with MRI from 10/11/2018.  Patient has multilevel multifactorial degenerative changes with stenosis of the lumbar spine.  He has higher level lumbar stenosis which could contribute to L2-L3 nerve irritation on the right side that could cause pain in his groin and inner thigh.  Discussed symptoms with patient.  He is currently asymptomatic and doing well with no pain or discomfort on exam nor with ambulation.  His vital signs are stable.  No signs of any infectious process.  Likely this pain is coming from an acute right lumbar radiculopathy.  Patient given prescription for steroid taper to take if symptoms return and are persistent.  If symptoms do not return, patient will hold off on taking this medication.  He  understands signs and symptoms return to the ER for. Final Clinical Impression(s) / ED Diagnoses Final diagnoses:  Right groin pain  Spinal stenosis of lumbar region without neurogenic claudication    Rx / DC Orders ED Discharge Orders          Ordered    predniSONE (DELTASONE) 10 MG tablet  Daily        11/18/21 2116              Renata Caprice 11/18/21 2124    Blake Divine, MD 11/18/21 [phone]

## 2021-11-18 NOTE — Discharge Instructions (Signed)
Please continue to monitor your pain, if pain returns and is moderate to severe start 6-day steroid taper.  If no improvement would recommend follow-up with back specialist.  Return to the ER for any weakness, inability to walk, fevers, worsening symptoms or urgent changes in your health.

## 2021-11-18 NOTE — ED Triage Notes (Signed)
Pt to ER via POV with complaints of right sided groin pain that cam on suddenly this afternoon without injury. Reports sitting when he suddenly started experiencing severe discomfort.   No daily blood thinner usage. Reports taking plavix.

## 2021-11-19 ENCOUNTER — Telehealth (INDEPENDENT_AMBULATORY_CARE_PROVIDER_SITE_OTHER): Payer: Self-pay

## 2021-11-19 NOTE — Telephone Encounter (Signed)
Provider made aware and pt told  if it continues he should follow up with PCP.

## 2021-11-19 NOTE — Telephone Encounter (Signed)
Pt called and left a VM on the nurses line saying he is having a shap pain shooting down his Rt thigh and back of his knee  pt said that the pain got to severe so he went to the ED where he was told it was a nerve spasm and was given prednisone. Pt was not having any pain  change in color, swelling,warmth, or noticeable varicosities at this time.

## 2021-11-21 ENCOUNTER — Telehealth: Payer: Self-pay

## 2021-11-21 NOTE — Telephone Encounter (Signed)
Spoke to pt. He is doing better. Pain has subsided. Possibly a pinched nerve.

## 2021-11-22 ENCOUNTER — Other Ambulatory Visit: Payer: Self-pay

## 2021-11-22 ENCOUNTER — Ambulatory Visit (INDEPENDENT_AMBULATORY_CARE_PROVIDER_SITE_OTHER): Payer: Medicare PPO

## 2021-11-22 DIAGNOSIS — C679 Malignant neoplasm of bladder, unspecified: Secondary | ICD-10-CM

## 2021-11-22 LAB — MICROSCOPIC EXAMINATION

## 2021-11-22 LAB — URINALYSIS, COMPLETE
Bilirubin, UA: NEGATIVE
Glucose, UA: NEGATIVE
Ketones, UA: NEGATIVE
Nitrite, UA: NEGATIVE
Specific Gravity, UA: 1.025 (ref 1.005–1.030)
Urobilinogen, Ur: 0.2 mg/dL (ref 0.2–1.0)
pH, UA: 6 (ref 5.0–7.5)

## 2021-11-22 MED ORDER — BCG LIVE 50 MG IS SUSR
3.2400 mL | Freq: Once | INTRAVESICAL | Status: AC
  Administered 2021-11-22: 81 mg via INTRAVESICAL

## 2021-11-22 NOTE — Progress Notes (Signed)
BCG Bladder Instillation  BCG # Reinduction #3   Due to Bladder Cancer patient is present today for a BCG treatment. Patient was cleaned and prepped in a sterile fashion with betadine. A 14FR catheter was inserted, urine return was noted 47ml, urine was dark yellow in color. 57ml of reconstituted BCG was instilled into the bladder. The catheter was then removed. Patient tolerated well, no complications were noted.  Performed by: Gordy Clement, CMA   Follow up/ Additional notes: RTC as scheduled, will call with urine cx results

## 2021-11-28 ENCOUNTER — Telehealth: Payer: Self-pay | Admitting: *Deleted

## 2021-11-28 LAB — CULTURE, URINE COMPREHENSIVE

## 2021-11-28 NOTE — Telephone Encounter (Signed)
-----   Message from Nori Riis, PA-C sent at 11/28/2021  1:01 PM EST ----- Please let Brandon Lewis know that his urine culture did grow out a bacteria.  This may be the result of colonization versus an actual infection.  If he feels well, I recommend he present tomorrow for his BCG treatment.  If he is having any symptoms, we should pause the BCG treatments and place him on an antibiotic.

## 2021-11-28 NOTE — Telephone Encounter (Signed)
Pt states he is only having some frequent urination  that has gotten better. He does not believe he has any true "uti symptoms"

## 2021-11-29 ENCOUNTER — Ambulatory Visit: Payer: Medicare PPO | Admitting: *Deleted

## 2021-11-29 ENCOUNTER — Other Ambulatory Visit: Payer: Self-pay

## 2021-11-29 DIAGNOSIS — C679 Malignant neoplasm of bladder, unspecified: Secondary | ICD-10-CM

## 2021-11-29 LAB — URINALYSIS, COMPLETE
Bilirubin, UA: NEGATIVE
Glucose, UA: NEGATIVE
Leukocytes,UA: NEGATIVE
Nitrite, UA: NEGATIVE
RBC, UA: NEGATIVE
Specific Gravity, UA: >1.03 — ABNORMAL HIGH (ref 1.005–1.030)
Urobilinogen, Ur: 0.2 mg/dL (ref 0.2–1.0)
pH, UA: 5.5 (ref 5.0–7.5)

## 2021-11-29 LAB — MICROSCOPIC EXAMINATION: RBC, Urine: NONE SEEN /hpf (ref 0–2)

## 2021-11-29 MED ORDER — BCG LIVE 50 MG IS SUSR
3.2400 mL | Freq: Once | INTRAVESICAL | Status: AC
  Administered 2021-11-29: 81 mg via INTRAVESICAL

## 2021-11-29 NOTE — Progress Notes (Signed)
BCG Bladder Instillation  BCG # 4  Due to Bladder Cancer patient is present today for a BCG treatment. Patient was cleaned and prepped in a sterile fashion with betadine. A 14FR catheter was inserted, urine return was noted 73ml, urine was yellow in color.  36ml of reconstituted BCG was instilled into the bladder. The catheter was then removed. Patient tolerated well, no complications were noted  Performed by: Verlene Mayer, [place], CMA  Follow up/ Additional notes: Return next week

## 2021-11-30 ENCOUNTER — Encounter (INDEPENDENT_AMBULATORY_CARE_PROVIDER_SITE_OTHER): Payer: Self-pay | Admitting: Nurse Practitioner

## 2021-11-30 NOTE — Progress Notes (Signed)
Subjective:    Patient ID: Brandon Lewis, male    DOB: June 03, 1934, 88 y.o.   MRN: [id number] Chief Complaint  Patient presents with   Follow-up    4 week     Brandon Lewis is a 88 year old male that was previously in [place] wraps due to lower extremity swelling with weeping.  Today that swelling is under much better control.  He no longer has any weeping.  Overall the wound wraps have been very positive factor in his care.   Review of Systems  Cardiovascular:  Positive for leg swelling.  All other systems reviewed and are negative.     Objective:   Physical Exam Vitals reviewed.  HENT:     Head: Normocephalic.  Cardiovascular:     Rate and Rhythm: Normal rate.  Pulmonary:     Effort: Pulmonary effort is normal.  Musculoskeletal:     Right lower leg: Edema present.     Left lower leg: Edema present.  Neurological:     Mental Status: He is alert and oriented to person, place, and time.  Psychiatric:        Mood and Affect: Mood normal.        Behavior: Behavior normal.        Thought Content: Thought content normal.        Judgment: Judgment normal.    BP 112/66 (BP Location: Left Arm)    Pulse 84    Resp 16    Ht 5\' 10"  (1.778 m)    Wt 204 lb (92.5 kg)    BMI 29.27 kg/m   Past Medical History:  Diagnosis Date   Anemia    Aortic atherosclerosis (HCC)    Atrial flutter, paroxysmal (HCC)    a.) postoperatively following CABG   Bladder cancer ([place]) 04/2020   BPH (benign prostatic hyperplasia)    CAD (coronary artery disease) 09/12/2019   a.) LHC 09/12/2019: EF 55-60%; 85% m-dLM, 50% pLCx, 85% p-m LAD, 70% pLAD, 45% mLAD, 70% p-mRCA. b.) 4v CABG 09/16/2019.   CKD (chronic kidney disease), stage III (HCC)    Diverticulosis    GERD (gastroesophageal reflux disease)    History of kidney stones    HTN (hypertension)    Hyperlipidemia    Long term current use of antithrombotics/antiplatelets    a.) DAPT therapies (ASA + clopidogrel)   NSTEMI (non-ST elevated  myocardial infarction) ([place]) 09/11/2019   a.) LHC 09/12/2019: 85% m-dLM, 50% pLCx, 85% p-m LAD, 70% pLAD, 45% mLAD, 70% p-mRCA; transferred to [facility] for [facility] consult. Underwent 4v CABG on 09/16/2019.   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   a.) 4v CABG: LIMA-LAD, SVG-RCA, SVG-OM1, SVG-D1   Small bowel obstruction (HCC)    after surgeries   Thoracic ascending aortic aneurysm    a.) measured 4.1 cm by CT on 09/14/2019    Social History   Socioeconomic History   Marital status: Married    Spouse name: Not on file   Number of children: 3   Years of education: Not on file   Highest education level: Not on file  Occupational History   Occupation: Landscape architect    Comment: Retired  Tobacco Use   Smoking status: Former    Packs/day: 1.00  Years: 20.00    Pack years: 20.00    Types: Cigarettes    Quit date: 10/20/1992    Years since quitting: 29.1   Smokeless tobacco: Never   Tobacco comments:    quit in 1994   Vaping Use   Vaping Use: Never used  Substance and Sexual Activity   Alcohol use: Yes    Comment: rare   Drug use: No   Sexual activity: Not on file  Other Topics Concern   Not on file  Social History Narrative   Widowed; then remarried; 3 sons.      Has a living will - wife has health care POA    Son Brandon Lewis would be alternate   Would want resuscitation attempts   Would accept brief trial of artificial nutrition      Pt signed designated party release form and gives Harless Molinari [phone] (home #), access to medical records. Can also leave msg on home answering machine. Cell # [phone]   Social Determinants of Health   Financial Resource Strain: Not on file  Food Insecurity: Not on file  Transportation Needs: Not on file   Physical Activity: Not on file  Stress: Not on file  Social Connections: Not on file  Intimate Partner Violence: Not on file    Past Surgical History:  Procedure Laterality Date   aorto-bifem  2003   [facility]    CAROTID STENT     [facility]   CATARACT EXTRACTION, BILATERAL  2017   CHOLECYSTECTOMY  2002   COLONOSCOPY     COLONOSCOPY WITH PROPOFOL N/A 07/17/2021   Procedure: COLONOSCOPY WITH PROPOFOL;  Surgeon: [name], MD;  Location: [facility];  Service: Gastroenterology;  Laterality: N/A;   CORONARY ARTERY BYPASS GRAFT N/A 09/16/2019   Procedure: CORONARY ARTERY BYPASS GRAFTING (CABG) using LIMA to LAD; Endoscopic right saphenous vein harvest to OM1, Diag1, and RCA.;  Surgeon: Wonda Olds, MD;  Location: [facility];  Service: Open Heart Surgery;  Laterality: N/A;   CYSTOSCOPY WITH BIOPSY N/A 10/08/2021   Procedure: CYSTOSCOPY WITH BLADDER BIOPSY;  Surgeon: Abbie Sons, MD;  Location: [facility];  Service: Urology;  Laterality: N/A;   ENDARTERECTOMY FEMORAL Left 03/13/2021   Procedure: ENDARTERECTOMY FEMORAL (GROIN REVISION);  Surgeon: Katha Cabal, MD;  Location: [facility];  Service: Vascular;  Laterality: Left;   LEFT HEART CATH AND CORONARY ANGIOGRAPHY N/A 09/12/2019   Procedure: LEFT HEART CATH AND CORONARY ANGIOGRAPHY;  Surgeon: Corey Skains, MD;  Location: [facility];  Service: Cardiovascular;  Laterality: N/A;   LITHOTRIPSY     LOWER EXTREMITY ANGIOGRAPHY Left 01/15/2021   Procedure: LOWER EXTREMITY ANGIOGRAPHY;  Surgeon: Katha Cabal, MD;  Location: [facility];  Service: Cardiovascular;  Laterality: Left;   TEE WITHOUT CARDIOVERSION N/A 09/16/2019   Procedure: TRANSESOPHAGEAL ECHOCARDIOGRAM (TEE);  Surgeon: Wonda Olds, MD;  Location: [facility];  Service: Open Heart Surgery;  Laterality: N/A;   TONSILLECTOMY     TRANSURETHRAL RESECTION OF BLADDER TUMOR  10/08/2021   Procedure: TRANSURETHRAL RESECTION OF BLADDER TUMOR (TURBT);  Surgeon:  Abbie Sons, MD;  Location: [facility];  Service: Urology;;   TRANSURETHRAL RESECTION OF BLADDER TUMOR WITH MITOMYCIN-C N/A 07/24/2020   Procedure: TRANSURETHRAL RESECTION OF BLADDER TUMOR WITH Gemcitabine;  Surgeon: Abbie Sons, MD;  Location: [facility];  Service: Urology;  Laterality: N/A;   WISDOM TOOTH EXTRACTION      Family History  Problem Relation Age of Onset   Diabetes  Father        DM - and brother    Stroke Father    Coronary artery disease Paternal Grandfather    Colon cancer Neg Hx    Prostate cancer Neg Hx     Allergies  Allergen Reactions   Cilostazol Other (See Comments)    Bowel upset   Simvastatin Other (See Comments)    myalgia    CBC Latest Ref Rng & Units 10/03/2021 05/22/2021 03/25/2021  WBC 4.0 - 10.5 K/uL 11.0(H) 8.8 11.0(H)  Hemoglobin 13.0 - 17.0 g/dL 10.2(L) 10.9(L) 8.9(L)  Hematocrit 39.0 - 52.0 % 31.6(L) 33.3(L) 28.3(L)  Platelets 150 - 400 K/uL 155 161.0 226      CMP     Component Value Date/Time   NA 136 10/03/2021 1216   K 4.3 10/03/2021 1216   CL 107 10/03/2021 1216   CO2 25 10/03/2021 1216   GLUCOSE 229 (H) 10/03/2021 1216   BUN 25 (H) 10/03/2021 1216   CREATININE 1.48 (H) 10/03/2021 1216   CALCIUM 9.2 10/03/2021 1216   PROT 7.0 10/16/2020 0634   ALBUMIN 3.8 05/22/2021 1308   AST 23 10/16/2020 0634   ALT 13 10/16/2020 0634   ALKPHOS 60 10/16/2020 0634   BILITOT 1.1 10/16/2020 0634   GFRNONAA 46 (L) 10/03/2021 1216   GFRAA 45 (L) 10/01/2019 0327     No results found.     Assessment & Plan:   1. Lymphedema The patient's lower extremity edema is improved however after discussion the patient may benefit from the lymphedema clinic.  In the interim the patient will continue to utilize conservative therapy tactics including use of medical grade compression, elevation and activity as tolerated. - Ambulatory referral to Occupational Therapy  2. 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  Currently is stable.  Patient will continue to follow-up on his routine follow-up scheduled.   Current Outpatient Medications on File Prior to Visit  Medication Sig Dispense Refill   aspirin EC 81 MG tablet Take 81 mg by mouth in the morning. Swallow whole.     calcium carbonate (TUMS - DOSED IN MG ELEMENTAL CALCIUM) 500 MG chewable tablet Chew 1,000 mg by mouth daily as needed for indigestion or heartburn.     Cholecalciferol (VITAMIN D) 50 MCG (2000 UT) CAPS Take 2,000 Units by mouth in the morning.     clopidogrel (PLAVIX) 75 MG tablet *VIAL ONLY* TAKE ONE TABLET BY MOUTH EVERY DAY 90 tablet 3   hydrocortisone (ANUSOL-HC) 2.5 % rectal cream Place 1 application rectally daily as needed for hemorrhoids or anal itching.     MAGNESIUM GLYCINATE PO Take 500 mg by mouth every evening.     metoprolol succinate (TOPROL-XL) 50 MG 24 hr tablet Take 1 tablet (50 mg total) by mouth in the morning. 90 tablet 1   metroNIDAZOLE (METROGEL) 0.75 % gel Apply 1 application topically 2 (two) times daily. 45 g 11   Multiple Vitamin (MULTIVITAMIN WITH MINERALS) TABS tablet Take 1 tablet by mouth daily. Centrum Silver     pantoprazole (PROTONIX) 40 MG tablet Take 40 mg by mouth daily as needed (indigestion/heartburn).     rosuvastatin (CRESTOR) 10 MG tablet Take 1 tablet (10 mg total) by mouth every evening. 90 tablet 1   sodium chloride (OCEAN) 0.65 % nasal spray Place 1 spray into the nose daily as needed (sinus congestion).     tamsulosin (FLOMAX) 0.4  MG CAPS capsule TAKE 1 CAPSULE BY MOUTH ONCE DAILY (Patient taking differently: Take 0.4 mg by mouth daily after breakfast.) 30 capsule 11   TURMERIC CURCUMIN PO Take 2 tablets by mouth daily.     No current facility-administered medications on file prior to visit.    There are no Patient Instructions on file for this visit. No follow-ups on file.   Kris Hartmann, NP

## 2021-12-06 ENCOUNTER — Ambulatory Visit: Payer: Medicare PPO

## 2021-12-06 ENCOUNTER — Other Ambulatory Visit: Payer: Self-pay

## 2021-12-06 DIAGNOSIS — C679 Malignant neoplasm of bladder, unspecified: Secondary | ICD-10-CM

## 2021-12-06 LAB — URINALYSIS, COMPLETE
Bilirubin, UA: NEGATIVE
Glucose, UA: NEGATIVE
Ketones, UA: NEGATIVE
Leukocytes,UA: NEGATIVE
Nitrite, UA: NEGATIVE
Protein,UA: NEGATIVE
RBC, UA: NEGATIVE
Specific Gravity, UA: 1.025 (ref 1.005–1.030)
Urobilinogen, Ur: 0.2 mg/dL (ref 0.2–1.0)
pH, UA: 6 (ref 5.0–7.5)

## 2021-12-06 LAB — MICROSCOPIC EXAMINATION
Bacteria, UA: NONE SEEN
RBC, Urine: NONE SEEN /hpf (ref 0–2)

## 2021-12-06 MED ORDER — BCG LIVE 50 MG IS SUSR
3.2400 mL | Freq: Once | INTRAVESICAL | Status: AC
  Administered 2021-12-06: 81 mg via INTRAVESICAL

## 2021-12-06 NOTE — Progress Notes (Signed)
BCG Bladder Instillation  BCG # 5  Due to Bladder Cancer patient is present today for a BCG treatment. Patient was cleaned and prepped in a sterile fashion with betadine. A 14FR catheter was inserted, urine return was noted 66ml, urine was yellow in color.  50ml of reconstituted BCG was instilled into the bladder. The catheter was then removed. Patient tolerated well, no complications were noted.  Performed by: Edwin Dada, [place], CMA   Follow up/ Additional notes: RTC in 1 week as scheduled

## 2021-12-06 NOTE — Progress Notes (Deleted)
Patient ID: Brandon Lewis, male   DOB: Jul 29, 1934, 88 y.o.   MRN: [id number] BCG Bladder Instillation  BCG # 5  Due to Bladder Cancer patient is present today for a BCG treatment. Patient was cleaned and prepped in a sterile fashion with betadine. A 14FR catheter was inserted, urine return was noted 32ml, urine was yellow in color.  40ml of reconstituted BCG was instilled into the bladder. The catheter was then removed. Patient tolerated well, no complications were noted  Performed by: Edwin Dada, [place]  Follow up/ Additional notes: 12/13/2021

## 2021-12-13 ENCOUNTER — Ambulatory Visit (INDEPENDENT_AMBULATORY_CARE_PROVIDER_SITE_OTHER): Payer: Medicare PPO

## 2021-12-13 ENCOUNTER — Other Ambulatory Visit: Payer: Self-pay

## 2021-12-13 DIAGNOSIS — C679 Malignant neoplasm of bladder, unspecified: Secondary | ICD-10-CM

## 2021-12-13 LAB — URINALYSIS, COMPLETE
Bilirubin, UA: NEGATIVE
Glucose, UA: NEGATIVE
Ketones, UA: NEGATIVE
Leukocytes,UA: NEGATIVE
Nitrite, UA: NEGATIVE
Protein,UA: NEGATIVE
RBC, UA: NEGATIVE
Specific Gravity, UA: 1.02 (ref 1.005–1.030)
Urobilinogen, Ur: 0.2 mg/dL (ref 0.2–1.0)
pH, UA: 5.5 (ref 5.0–7.5)

## 2021-12-13 LAB — MICROSCOPIC EXAMINATION
Bacteria, UA: NONE SEEN
Epithelial Cells (non renal): NONE SEEN /hpf (ref 0–10)

## 2021-12-13 MED ORDER — BCG LIVE 50 MG IS SUSR
3.2400 mL | Freq: Once | INTRAVESICAL | Status: AC
  Administered 2021-12-13: 81 mg via INTRAVESICAL

## 2021-12-13 NOTE — Progress Notes (Signed)
BCG Bladder Instillation  BCG # 6  Due to Bladder Cancer patient is present today for a BCG treatment. Patient was cleaned and prepped in a sterile fashion with betadine. A 14FR catheter was inserted, urine return was noted 48ml, urine was yellow in color.  59ml of reconstituted BCG was instilled into the bladder. The catheter was then removed. Patient tolerated well, no complications were noted.  Performed by: Gordy Clement, CMA   Follow up/ Additional notes: RTC as scheduled for cysto

## 2021-12-25 DIAGNOSIS — H903 Sensorineural hearing loss, bilateral: Secondary | ICD-10-CM | POA: Diagnosis not present

## 2022-01-06 ENCOUNTER — Other Ambulatory Visit: Payer: Self-pay | Admitting: Internal Medicine

## 2022-01-06 NOTE — Telephone Encounter (Signed)
Last PCP apt 05/22/21. Next apt 05/26/22. ?Sent in refills.  ?

## 2022-02-14 ENCOUNTER — Other Ambulatory Visit: Payer: Self-pay | Admitting: Internal Medicine

## 2022-02-21 ENCOUNTER — Other Ambulatory Visit (INDEPENDENT_AMBULATORY_CARE_PROVIDER_SITE_OTHER): Payer: Self-pay | Admitting: Vascular Surgery

## 2022-02-21 DIAGNOSIS — I739 Peripheral vascular disease, unspecified: Secondary | ICD-10-CM

## 2022-02-24 ENCOUNTER — Encounter (INDEPENDENT_AMBULATORY_CARE_PROVIDER_SITE_OTHER): Payer: Self-pay | Admitting: Nurse Practitioner

## 2022-02-24 ENCOUNTER — Ambulatory Visit (INDEPENDENT_AMBULATORY_CARE_PROVIDER_SITE_OTHER): Payer: Medicare PPO

## 2022-02-24 ENCOUNTER — Ambulatory Visit (INDEPENDENT_AMBULATORY_CARE_PROVIDER_SITE_OTHER): Payer: Medicare PPO | Admitting: Nurse Practitioner

## 2022-02-24 VITALS — BP 109/69 | HR 88 | Resp 16 | Wt 206.0 lb

## 2022-02-24 DIAGNOSIS — Z9889 Other specified postprocedural states: Secondary | ICD-10-CM

## 2022-02-24 DIAGNOSIS — I739 Peripheral vascular disease, unspecified: Secondary | ICD-10-CM

## 2022-02-24 DIAGNOSIS — I10 Essential (primary) hypertension: Secondary | ICD-10-CM | POA: Diagnosis not present

## 2022-02-24 DIAGNOSIS — I890 Lymphedema, not elsewhere classified: Secondary | ICD-10-CM

## 2022-03-08 ENCOUNTER — Encounter (INDEPENDENT_AMBULATORY_CARE_PROVIDER_SITE_OTHER): Payer: Self-pay | Admitting: Nurse Practitioner

## 2022-03-08 NOTE — Progress Notes (Signed)
Subjective:    Patient ID: Brandon Lewis, male    DOB: October 19, 1934, 88 y.o.   MRN: [id number] Chief Complaint  Patient presents with   Follow-up    Ultrasound follow up    The patient returns to the office for followup and review of the noninvasive studies.   There have been no interval changes in lower extremity symptoms. No interval shortening of the patient's claudication distance or development of rest pain symptoms. No new ulcers or wounds have occurred since the last visit.  There have been no significant changes to the patient's overall health care.  The patient denies amaurosis fugax or recent TIA symptoms. There are no documented recent neurological changes noted. There is no history of DVT, PE or superficial thrombophlebitis. The patient denies recent episodes of angina or shortness of breath.   ABI Rt=0.78 and Lt=0.88  (previous ABI's Rt=0.72 and Lt=0.85) Duplex ultrasound of the bilateral tibial arteries shows monophasic waveforms.  The patient has aortofemoral bypass graft with no evidence of significant stenosis in all limbs are patent with biphasic flow throughout.   Review of Systems  Cardiovascular:  Positive for leg swelling.  All other systems reviewed and are negative.     Objective:   Physical Exam Vitals reviewed.  HENT:     Head: Normocephalic.  Cardiovascular:     Rate and Rhythm: Normal rate.     Pulses:          Dorsalis pedis pulses are detected w/ Doppler on the right side and detected w/ Doppler on the left side.       Posterior tibial pulses are detected w/ Doppler on the right side and detected w/ Doppler on the left side.  Pulmonary:     Effort: Pulmonary effort is normal.  Skin:    General: Skin is warm and dry.  Neurological:     Mental Status: He is alert and oriented to person, place, and time.  Psychiatric:        Mood and Affect: Mood normal.        Behavior: Behavior normal.        Thought Content: Thought content normal.         Judgment: Judgment normal.    BP 109/69 (BP Location: Left Arm)   Pulse 88   Resp 16   Wt 206 lb (93.4 kg)   BMI 29.56 kg/m   Past Medical History:  Diagnosis Date   Anemia    Aortic atherosclerosis (HCC)    Atrial flutter, paroxysmal (HCC)    a.) postoperatively following CABG   Bladder cancer ([place]) 04/2020   BPH (benign prostatic hyperplasia)    CAD (coronary artery disease) 09/12/2019   a.) LHC 09/12/2019: EF 55-60%; 85% m-dLM, 50% pLCx, 85% p-m LAD, 70% pLAD, 45% mLAD, 70% p-mRCA. b.) 4v CABG 09/16/2019.   CKD (chronic kidney disease), stage III (HCC)    Diverticulosis    GERD (gastroesophageal reflux disease)    History of kidney stones    HTN (hypertension)    Hyperlipidemia    Long term current use of antithrombotics/antiplatelets    a.) DAPT therapies (ASA + clopidogrel)   NSTEMI (non-ST elevated myocardial infarction) ([place]) 09/11/2019   a.) LHC 09/12/2019: 85% m-dLM, 50% pLCx, 85% p-m LAD, 70% pLAD, 45% mLAD, 70% p-mRCA; transferred to [facility] for [facility] consult. Underwent 4v CABG on 09/16/2019.   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/16/2019   a.) 4v CABG: LIMA-LAD, SVG-RCA, SVG-OM1, SVG-D1   Small bowel obstruction (HCC)    after surgeries   Thoracic ascending aortic aneurysm (HCC)    a.) measured 4.1 cm by CT on 09/14/2019    Social History   Socioeconomic History   Marital status: Married    Spouse name: Not on file   Number of children: 3   Years of education: Not on file   Highest education level: Not on file  Occupational History   Occupation: Landscape architect    Comment: Retired  Tobacco Use   Smoking status: Former    Packs/day: 1.00    Years: 20.00    Pack years: 20.00    Types: Cigarettes    Quit date: 10/20/1992     Years since quitting: 29.4   Smokeless tobacco: Never   Tobacco comments:    quit in 1994   Vaping Use   Vaping Use: Never used  Substance and Sexual Activity   Alcohol use: Yes    Comment: rare   Drug use: No   Sexual activity: Not on file  Other Topics Concern   Not on file  Social History Narrative   Widowed; then remarried; 3 sons.      Has a living will - wife has health care POA    Son [name] would be alternate   Would want resuscitation attempts   Would accept brief trial of artificial nutrition      Pt signed designated party release form and gives Tremon Sainvil [phone] (home #), access to medical records. Can also leave msg on home answering machine. Cell # [phone]   Social Determinants of Health   Financial Resource Strain: Not on file  Food Insecurity: Not on file  Transportation Needs: Not on file  Physical Activity: Not on file  Stress: Not on file  Social Connections: Not on file  Intimate Partner Violence: Not on file    Past Surgical History:  Procedure Laterality Date   aorto-bifem  2003   [facility]    CAROTID STENT     [facility]   CATARACT EXTRACTION, BILATERAL  2017   CHOLECYSTECTOMY  2002   COLONOSCOPY     COLONOSCOPY WITH PROPOFOL N/A 07/17/2021   Procedure: COLONOSCOPY WITH PROPOFOL;  Surgeon: [name], MD;  Location: [facility];  Service: Gastroenterology;  Laterality: N/A;   CORONARY ARTERY BYPASS GRAFT N/A 09/16/2019   Procedure: CORONARY ARTERY BYPASS GRAFTING (CABG) using LIMA to LAD; Endoscopic right saphenous vein harvest to OM1, Diag1, and RCA.;  Surgeon: Wonda Olds, MD;  Location: [place];  Service: Open Heart Surgery;  Laterality: N/A;   CYSTOSCOPY WITH BIOPSY N/A 10/08/2021   Procedure: CYSTOSCOPY WITH BLADDER BIOPSY;  Surgeon: Abbie Sons, MD;  Location: [facility];  Service: Urology;  Laterality: N/A;   ENDARTERECTOMY FEMORAL Left 03/13/2021   Procedure: ENDARTERECTOMY FEMORAL (GROIN REVISION);  Surgeon: Katha Cabal, MD;  Location: [facility];  Service: Vascular;  Laterality: Left;   LEFT HEART CATH AND CORONARY ANGIOGRAPHY N/A 09/12/2019   Procedure: LEFT HEART CATH AND CORONARY ANGIOGRAPHY;  Surgeon: Corey Skains, MD;  Location: [facility];  Service: Cardiovascular;  Laterality: N/A;   LITHOTRIPSY     LOWER EXTREMITY ANGIOGRAPHY Left 01/15/2021   Procedure: LOWER EXTREMITY ANGIOGRAPHY;  Surgeon: Katha Cabal,  MD;  Location: [facility];  Service: Cardiovascular;  Laterality: Left;   TEE WITHOUT CARDIOVERSION N/A 09/16/2019   Procedure: TRANSESOPHAGEAL ECHOCARDIOGRAM (TEE);  Surgeon: Wonda Olds, MD;  Location: [facility];  Service: Open Heart Surgery;  Laterality: N/A;   TONSILLECTOMY     TRANSURETHRAL RESECTION OF BLADDER TUMOR  10/08/2021   Procedure: TRANSURETHRAL RESECTION OF BLADDER TUMOR (TURBT);  Surgeon: Abbie Sons, MD;  Location: [facility];  Service: Urology;;   TRANSURETHRAL RESECTION OF BLADDER TUMOR WITH MITOMYCIN-C N/A 07/24/2020   Procedure: TRANSURETHRAL RESECTION OF BLADDER TUMOR WITH Gemcitabine;  Surgeon: Abbie Sons, MD;  Location: [facility];  Service: Urology;  Laterality: N/A;   WISDOM TOOTH EXTRACTION      Family History  Problem Relation Age of Onset   Diabetes Father        DM - and brother    Stroke Father    Coronary artery disease Paternal Grandfather    Colon cancer Neg Hx    Prostate cancer Neg Hx     Allergies  Allergen Reactions   Cilostazol Other (See Comments)    Bowel upset   Simvastatin Other (See Comments)    myalgia       Latest Ref Rng & Units 10/03/2021   12:16 PM 05/22/2021    1:08 PM 03/25/2021    1:49 PM  CBC  WBC 4.0 - 10.5 K/uL 11.0   8.8   11.0    Hemoglobin 13.0 - 17.0 g/dL 10.2   10.9   8.9    Hematocrit 39.0 - 52.0 % 31.6   33.3   28.3    Platelets 150 - 400 K/uL 155   161.0   226        CMP     Component Value Date/Time   NA 136 10/03/2021 1216   K 4.3 10/03/2021 1216   CL 107 10/03/2021 1216    CO2 25 10/03/2021 1216   GLUCOSE 229 (H) 10/03/2021 1216   BUN 25 (H) 10/03/2021 1216   CREATININE 1.48 (H) 10/03/2021 1216   CALCIUM 9.2 10/03/2021 1216   PROT 7.0 10/16/2020 0634   ALBUMIN 3.8 05/22/2021 1308   AST 23 10/16/2020 0634   ALT 13 10/16/2020 0634   ALKPHOS 60 10/16/2020 0634   BILITOT 1.1 10/16/2020 0634   GFRNONAA 46 (L) 10/03/2021 1216   GFRAA 45 (L) 10/01/2019 0327     VAS [place] ABI WITH/WO TBI  Result Date: 02/24/2022  LOWER EXTREMITY DOPPLER STUDY Patient Name:  Brandon Lewis  Date of Exam:   02/24/2022 Medical Rec #: [id number]           Accession #:    [id number] Date of Birth: Jan 02, 1934           Patient Gender: M Patient Age:   7 years Exam Location:  [facility] Procedure:      VAS [place] ABI WITH/WO TBI Referring Phys: GREGORY SCHNIER --------------------------------------------------------------------------------  Indications: Peripheral artery disease.  Vascular Interventions: H/O aortobifemoral BPG;                         03/13/21: Redo left femoral exposure after previous                         surgery with left CFA & PFA endarterectomies;PTA;. Performing Technologist: Blondell Reveal RT, RDMS, RVT  Examination Guidelines: A complete evaluation includes at minimum, Doppler waveform signals  and systolic blood pressure reading at the level of bilateral brachial, anterior tibial, and posterior tibial arteries, when vessel segments are accessible. Bilateral testing is considered an integral part of a complete examination. Photoelectric Plethysmograph (PPG) waveforms and toe systolic pressure readings are included as required and additional duplex testing as needed. Limited examinations for reoccurring indications may be performed as noted.  ABI Findings: +---------+------------------+-----+----------+--------+ Right    Rt Pressure (mmHg)IndexWaveform  Comment  +---------+------------------+-----+----------+--------+ Brachial 99                                         +---------+------------------+-----+----------+--------+ ATA                             absent             +---------+------------------+-----+----------+--------+ PTA      88                0.78 monophasic         +---------+------------------+-----+----------+--------+ PERO     81                0.72 monophasic         +---------+------------------+-----+----------+--------+ Great Toe59                0.52 Abnormal           +---------+------------------+-----+----------+--------+ +---------+------------------+-----+----------+-------+ Left     Lt Pressure (mmHg)IndexWaveform  Comment +---------+------------------+-----+----------+-------+ Brachial 113                                      +---------+------------------+-----+----------+-------+ ATA                             absent            +---------+------------------+-----+----------+-------+ PTA      90                0.80 monophasic        +---------+------------------+-----+----------+-------+ PERO     99                0.88 monophasic        +---------+------------------+-----+----------+-------+ Great Toe68                0.60 Normal            +---------+------------------+-----+----------+-------+ +-------+-----------+-----------+------------+------------+ ABI/TBIToday's ABIToday's TBIPrevious ABIPrevious TBI +-------+-----------+-----------+------------+------------+ Right  0.78       0.52       0.72        0.70         +-------+-----------+-----------+------------+------------+ Left   0.88       0.60       0.85        0.81         +-------+-----------+-----------+------------+------------+ No flow detected in the bilateral ATAs consistent with occlusions seen on previous angiogram.  Bilateral ABIs appear essentially unchanged compared to prior study on 08/26/21.  Summary: Right: Resting right ankle-brachial index indicates moderate right lower extremity  arterial disease. The right toe-brachial index is abnormal. Left: Resting left ankle-brachial index indicates mild left lower extremity arterial disease. The left toe-brachial index is abnormal. *See table(s) above for measurements and observations.  Electronically signed by Hortencia Pilar  MD on 02/24/2022 at 4:17:08 PM.    Final        Assessment & Plan:   1. Peripheral arterial disease with history of revascularization (HCC)  Recommend:  The patient has evidence of atherosclerosis of the lower extremities with claudication.  The patient does not voice lifestyle limiting changes at this point in time.  Noninvasive studies do not suggest clinically significant change.  No invasive studies, angiography or surgery at this time The patient should continue walking  The patient should continue antiplatelet therapy and aggressive treatment of the lipid abnormalities  No changes in the patient's medications at this time  Continued surveillance is indicated as atherosclerosis is likely to progress with time.    The patient will continue follow up with noninvasive studies as ordered.  Patient will follow-up in 6 months  2. Lymphedema Patient's lower extremity swelling has been under good control.  Patient having continue with conservative therapy.  3. Essential hypertension Continue antihypertensive medications as already ordered, these medications have been reviewed and there are no changes at this time.    Current Outpatient Medications on File Prior to Visit  Medication Sig Dispense Refill   aspirin EC 81 MG tablet Take 81 mg by mouth in the morning. Swallow whole.     calcium carbonate (TUMS - DOSED IN MG ELEMENTAL CALCIUM) 500 MG chewable tablet Chew 1,000 mg by mouth daily as needed for indigestion or heartburn.     Cholecalciferol (VITAMIN D) 50 MCG (2000 UT) CAPS Take 2,000 Units by mouth in the morning.     clopidogrel (PLAVIX) 75 MG tablet *VIAL ONLY* TAKE ONE TABLET BY MOUTH EVERY  DAY 90 tablet 3   hydrocortisone (ANUSOL-HC) 2.5 % rectal cream Place 1 application rectally daily as needed for hemorrhoids or anal itching.     MAGNESIUM GLYCINATE PO Take 500 mg by mouth every evening.     metoprolol succinate (TOPROL-XL) 50 MG 24 hr tablet TAKE ONE TABLET BY MOUTH ONCE DAILY (HIGH BLOOD PRESSURE) 30 tablet 11   metroNIDAZOLE (METROGEL) 0.75 % gel Apply 1 application topically 2 (two) times daily. 45 g 11   Multiple Vitamin (MULTIVITAMIN WITH MINERALS) TABS tablet Take 1 tablet by mouth daily. Centrum Silver     pantoprazole (PROTONIX) 40 MG tablet Take 40 mg by mouth daily as needed (indigestion/heartburn).     rosuvastatin (CRESTOR) 10 MG tablet TAKE ONE TABLET BY MOUTH AT BEDTIME (IMPROVES CHOLESTEROL) 30 tablet 10   sodium chloride (OCEAN) 0.65 % nasal spray Place 1 spray into the nose daily as needed (sinus congestion).     tamsulosin (FLOMAX) 0.4 MG CAPS capsule TAKE 1 CAPSULE BY MOUTH ONCE DAILY 30 capsule 11   TURMERIC CURCUMIN PO Take 2 tablets by mouth daily.     predniSONE (DELTASONE) 10 MG tablet Take 1 tablet (10 mg total) by mouth daily. 6,5,4,3,2,1 six day taper 21 tablet 0   No current facility-administered medications on file prior to visit.    There are no Patient Instructions on file for this visit. No follow-ups on file.   Kris Hartmann, NP

## 2022-03-14 ENCOUNTER — Ambulatory Visit: Payer: Medicare PPO | Admitting: Urology

## 2022-03-14 VITALS — BP 130/73 | HR 82 | Ht 70.0 in | Wt 206.0 lb

## 2022-03-14 DIAGNOSIS — C679 Malignant neoplasm of bladder, unspecified: Secondary | ICD-10-CM | POA: Diagnosis not present

## 2022-03-14 DIAGNOSIS — Z8551 Personal history of malignant neoplasm of bladder: Secondary | ICD-10-CM | POA: Diagnosis not present

## 2022-03-14 DIAGNOSIS — N329 Bladder disorder, unspecified: Secondary | ICD-10-CM

## 2022-03-14 LAB — URINALYSIS, COMPLETE
Bilirubin, UA: NEGATIVE
Glucose, UA: NEGATIVE
Ketones, UA: NEGATIVE
Leukocytes,UA: NEGATIVE
Nitrite, UA: NEGATIVE
RBC, UA: NEGATIVE
Specific Gravity, UA: 1.03 (ref 1.005–1.030)
Urobilinogen, Ur: 0.2 mg/dL (ref 0.2–1.0)
pH, UA: 5.5 (ref 5.0–7.5)

## 2022-03-14 LAB — MICROSCOPIC EXAMINATION: Bacteria, UA: NONE SEEN

## 2022-03-16 NOTE — Progress Notes (Signed)
   03/16/22  CC:  Chief Complaint  Patient presents with   Cysto    Urologic history: 1.  Ta urothelial carcinoma bladder, high-grade TURBT 07/24/2020; ~ 2 cm right posterior wall tumor Induction BCG completed 11/09/2020 which was tolerated well Maintenance BCG (received only 1 dose May 2022 and doses 2/22 May 2021 due to shortage TURBT 10/08/2021 for abnormal mucosa early papillary change just inside the left bladder neck; Path high-grade Ta Reinduction BCG x6 completed 01/10/2022   HPI: Brandon Lewis has no complaints today  Blood pressure 126/73, pulse 85, height '5\' 10"'$  (1.778 m), weight 197 lb (89.4 kg). NED. A&Ox3.   No respiratory distress   Abd soft, NT, ND Normal phallus with bilateral descended testicles  Cystoscopy Procedure Note  Patient identification was confirmed, informed consent was obtained, and patient was prepped using Betadine solution.  Lidocaine jelly was administered per urethral meatus.     Pre-Procedure: - Inspection reveals a normal caliber urethral meatus.  Procedure: The flexible cystoscope was introduced without difficulty - No urethral strictures/lesions are present. -  Moderate lateral lobe enlargement  prostate  - Mild elevation bladder neck - Bilateral ureteral orifices identified - Bladder mucosa without erythema, solid or papillary lesions - No bladder stones - Mild trabeculation  Retroflexion shows no abnormalities   Post-Procedure: - Patient tolerated the procedure well  Assessment/ Plan: No endoscopic evidence recurrent urothelial carcinoma Discussed maintenance BCG and he desires to proceed He is due for a 3-week course  Follow-up surveillance cystoscopy 3 months    Abbie Sons, MD

## 2022-03-24 IMAGING — US US EXTREM LOW VENOUS*R*
1 series · 14 of 24 positions shown · non-contrast
Comparison: None.

CLINICAL DATA: Pain and swelling

EXAM:
Right LOWER EXTREMITY VENOUS DOPPLER ULTRASOUND
TECHNIQUE: Gray-scale sonography with compression, as well as color and duplex
ultrasound, were performed to evaluate the deep venous system(s)
from the level of the common femoral vein through the popliteal and
proximal calf veins.

[Series 1: us venous img lower uni right (dvt) · portal-venous · 14 of 33 slices shown]
[im 1/33]
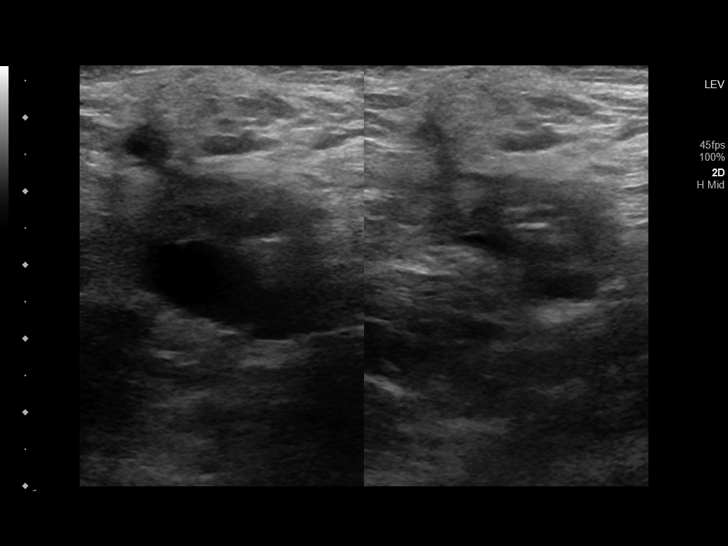
[im 3/33]
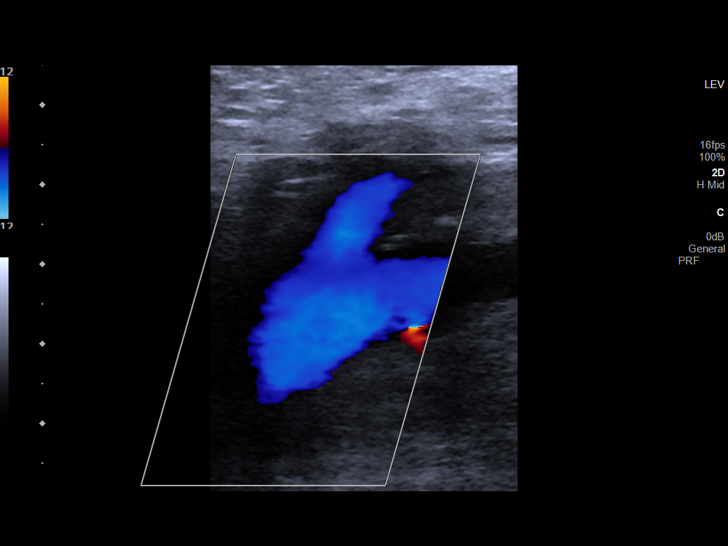
[im 6/33]
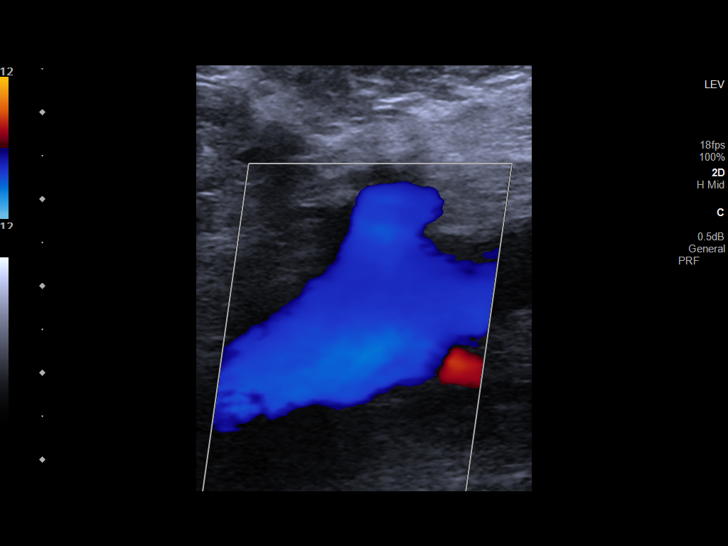
[im 9/33]
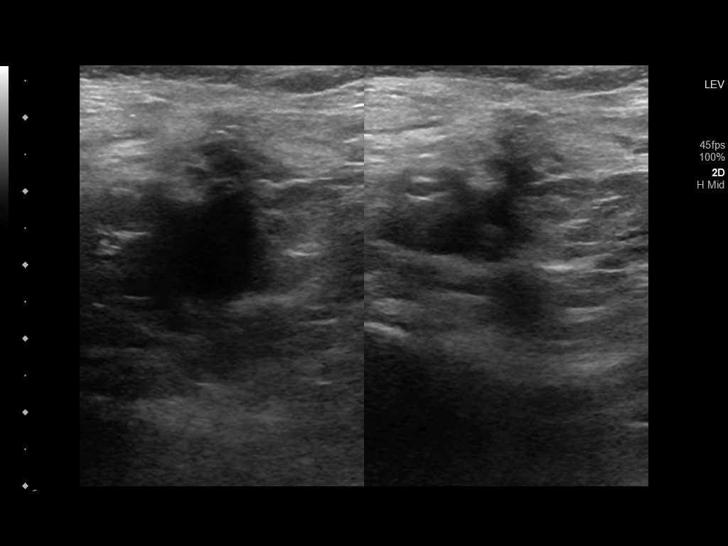
[im 10/33]
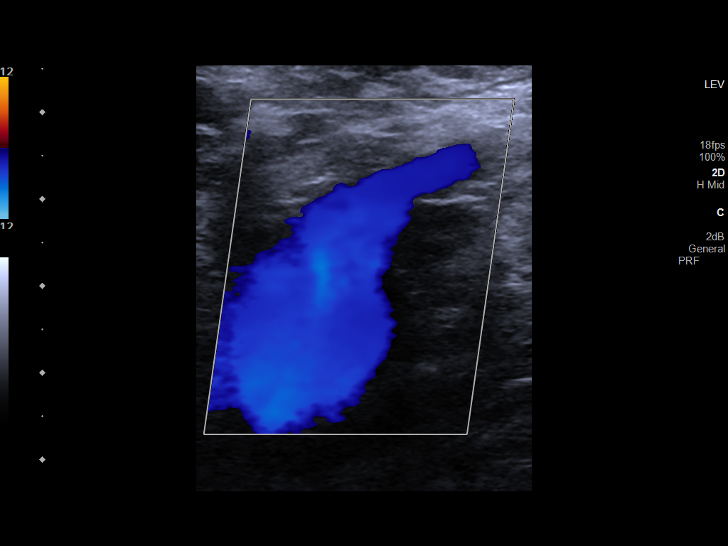
[im 13/33]
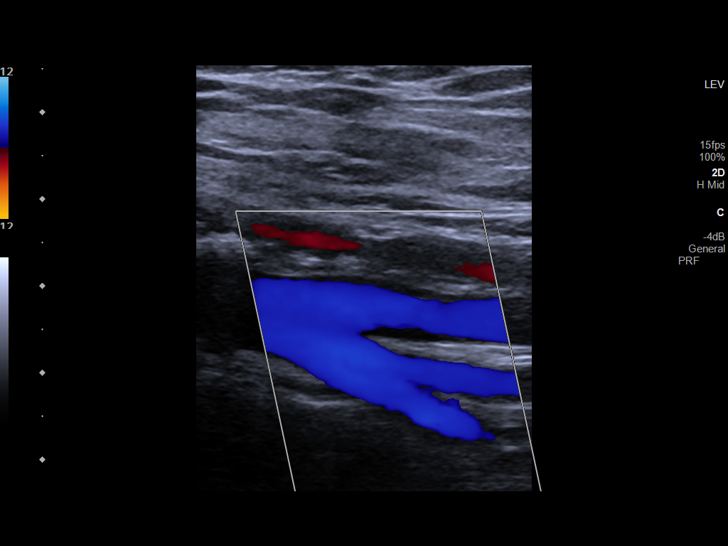
[im 16/33]
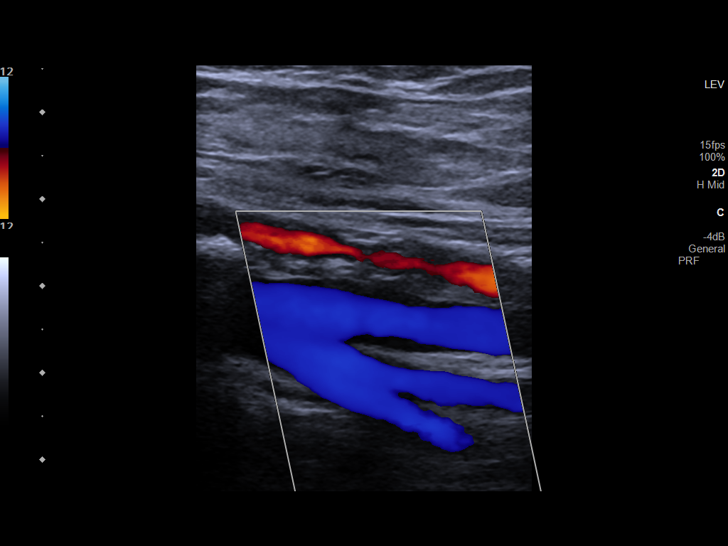
[im 17/33]
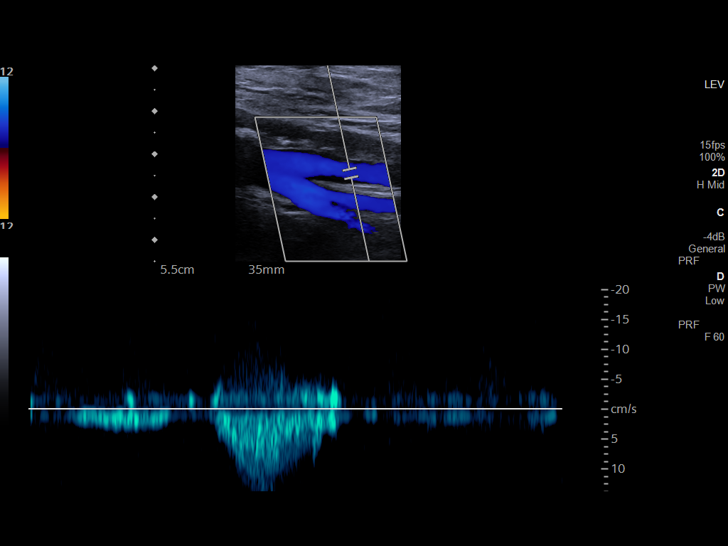
[im 20/33]
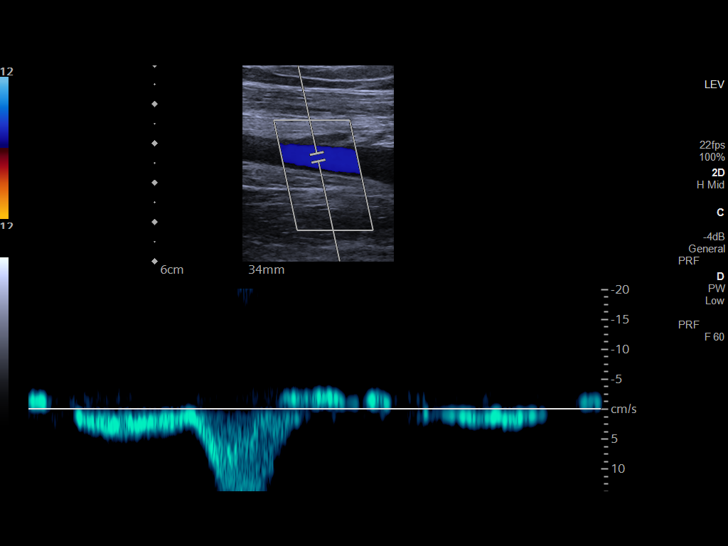
[im 23/33]
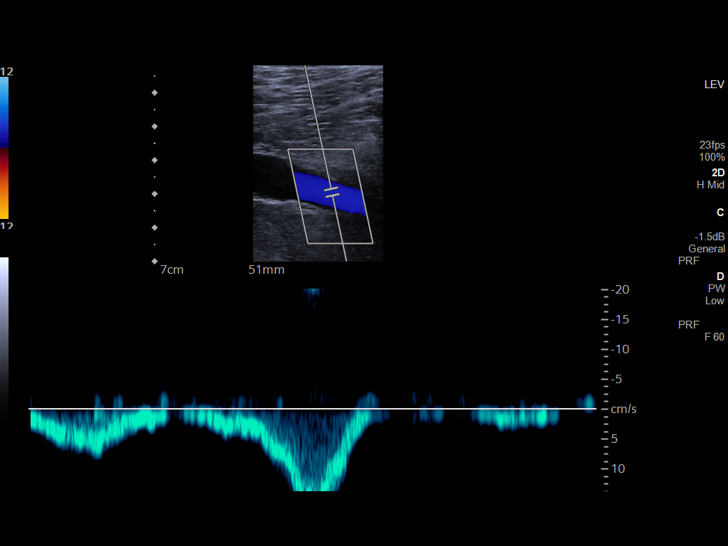
[im 26/33]
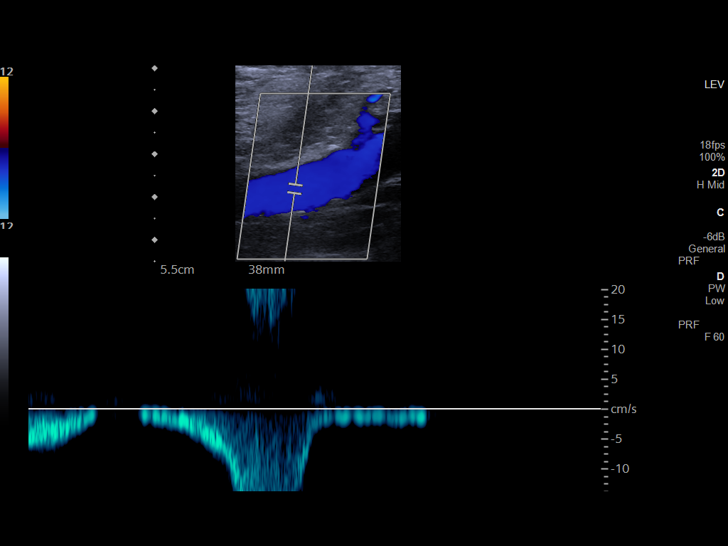
[im 27/33]
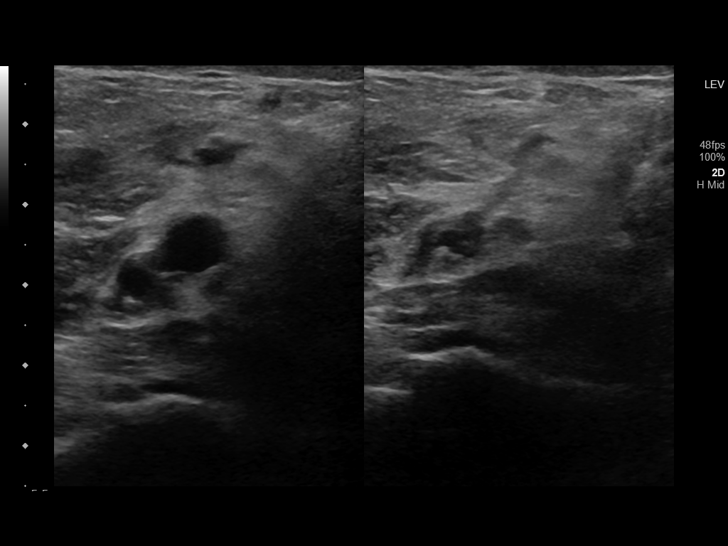
[im 30/33]
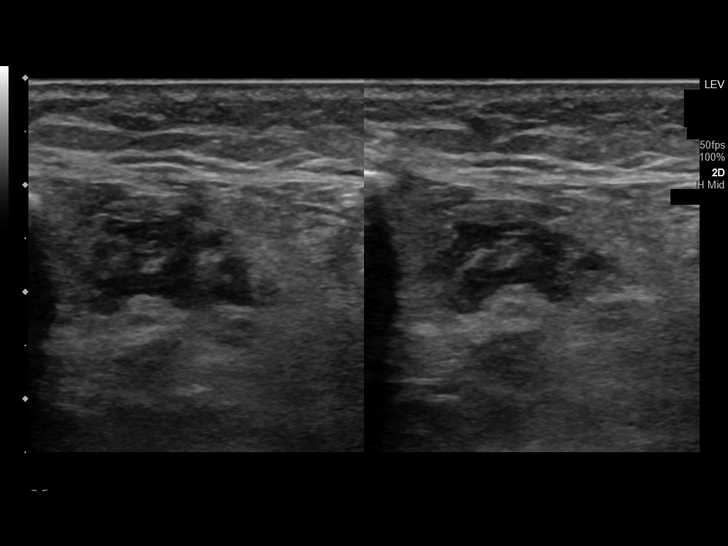
[im 33/33]
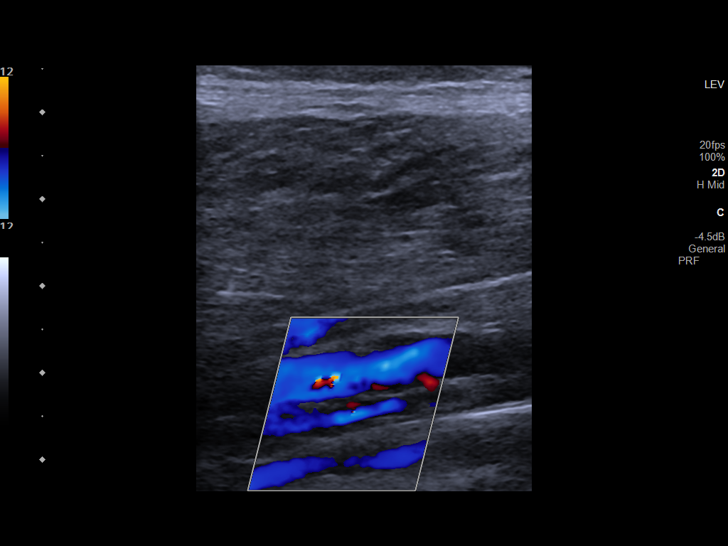

[14 of 24 positions shown; findings below may reference images not displayed]

FINDINGS: VENOUS

Normal compressibility of the common femoral, superficial femoral,
and popliteal veins, as well as the visualized calf veins.
Visualized portions of profunda femoral vein and great saphenous
vein unremarkable. No filling defects to suggest DVT on grayscale or
color Doppler imaging. Doppler waveforms show normal direction of
venous flow, normal respiratory plasticity and response to
augmentation.

Limited views of the contralateral common femoral vein are
unremarkable.

OTHER

None.

Limitations: none
IMPRESSION: There is no evidence of deep venous thrombosis in the right lower
extremity.

## 2022-03-28 ENCOUNTER — Telehealth: Payer: Self-pay

## 2022-03-28 NOTE — Telephone Encounter (Signed)
Pt scheduled for 3 mBCG appts. Pt confirmed.

## 2022-03-28 NOTE — Telephone Encounter (Signed)
-----   Message from Abbie Sons, MD sent at 03/16/2022 12:06 PM EDT ----- Regarding: Maintenance BCG Hi Bricyn Labrada, Will you please schedule Mr. Thornsberry for a 3-week course of maintenance BCG.  He just had cystoscopy last week and is due for maintenance anytime.  Thanks

## 2022-03-31 ENCOUNTER — Other Ambulatory Visit: Payer: Self-pay | Admitting: Internal Medicine

## 2022-04-01 ENCOUNTER — Ambulatory Visit (INDEPENDENT_AMBULATORY_CARE_PROVIDER_SITE_OTHER): Payer: Medicare PPO | Admitting: Physician Assistant

## 2022-04-01 DIAGNOSIS — D494 Neoplasm of unspecified behavior of bladder: Secondary | ICD-10-CM | POA: Diagnosis not present

## 2022-04-01 LAB — URINALYSIS, COMPLETE
Bilirubin, UA: NEGATIVE
Glucose, UA: NEGATIVE
Ketones, UA: NEGATIVE
Leukocytes,UA: NEGATIVE
Nitrite, UA: NEGATIVE
RBC, UA: NEGATIVE
Specific Gravity, UA: 1.03 (ref 1.005–1.030)
Urobilinogen, Ur: 0.2 mg/dL (ref 0.2–1.0)
pH, UA: 5.5 (ref 5.0–7.5)

## 2022-04-01 LAB — MICROSCOPIC EXAMINATION: Bacteria, UA: NONE SEEN

## 2022-04-01 MED ORDER — BCG LIVE 50 MG IS SUSR
3.2400 mL | Freq: Once | INTRAVESICAL | Status: AC
  Administered 2022-04-01: 81 mg via INTRAVESICAL

## 2022-04-01 NOTE — Patient Instructions (Addendum)
Your Timeline for Today:  Right now through 5:05pm: Hold your urine and do your quarter turns every 15 minutes. 5:05pm-11:05pm today: Every time you urinate, pour 1/2 cup of bleach into the toilet and let it sit for 15 minutes prior to flushing. 11:05pm onward: Resume your normal routine.   Patient Education: (BCG) Into the Bladder (Intravesical Chemotherapy)  BCG is a vaccine which is used to prevent tuberculosis (TB).  But it's also a helpful treatment for some early bladder cancers.  When BCG goes directly into the bladder the treatment is described as intravesical.  BCG is a type of immunotherapy.  Immunotherapy stimulates the body's immune system to destroy cancer cells.  How it's given BCG treatment is given to you in an outpatient setting.  It takes a few minutes to administer and you can go home as soon as it's finished.  It might be a good idea to ask someone to bring you, particularly the fist time.  Unlike chemotherapy into the bladder, BCG treatment is never given immediately after surgery to remove bladder tumors.  There needs to be a delay usually of at least two weeks after surgery, before you can have it.  You won't be given treatment with BCG if you are unwell or have an infection in your urine.  You're usually asked to limit the amount you drink before your treatment.  This will help to increase the concentration of BCG in your bladder.  Drinking too much before your treatment may make your bladder feel uncomfortably full.  If you normally take water tablets (diuretics) take them later in the day after your treatment.  Your nurse or doctor will give you more advise about preparing for your treatment.  You will have a small tube (catheter) placed into your bladder.  Your doctor will then put the liquid vaccine directly into your bladder through the catheter and remove the catheter.  You will need to hold your urine for two hours afterwards.  This can be difficult but it's to give  the treatment time to work.  You can walk around during this time.  When the treatment is over you can go to the toilet.  After your treatment there are some precautions you'll need to take.  This is because BCG is a live vaccine and other people shouldn't be exposed to it.  For the next six hours, you'll need to avoid your urine splashing on the toilet seat and getting any urine on your hands.  It might be easer for men to sit down when they're using an ordinary toilet although using a stand up urinal should be alright.  The main this is to avoid splashing urine and spreading the vaccine.  You will also be asked to put 1/2 cup undiluted bleach into the toilet to destroy any live vaccine and leave it for 15 minutes until you flush.  Side Effects Because BCG goes directly into the bladder most of the side effects are linked with the bladder.  They usually go away within one to two days after your treatment.  The most common ones are: -needing to pass urine often -pain when you pass urine -blood in urine -flu-like symptoms (tiredness, general aching and raised temperature)  Theses side effects should settle down within a day or two.  If they don't get better contact your doctor.  Drinking lots of fluids can help flush the drug out of your bladder and reduce some of these effects.  Taking Ibuprofen or Aleve is  encouraged unless you have a condition that would make these medications unsafe to take (renal failure, diabetes, gerd)  Rare side effects can include a continuing high temperature (fever), pain in your joints and a cough.  If you have any of these symptoms, or if you feel generally unwell, contact your doctor.  These symptoms could be a sign of a more serious infection (due to BCG) that needs to be treated immediately.  If this happens you'll be treated with the same drugs (antibiotics) that are used to treat TB.  Contraception Men should use a condom during sex for the first 48 hours after  their treatment.  If you are a women who has had BCG treatment then your partner should use a condom.  Using a condom will protect your partner from any vaccine present in your semen or vaginal fluid.  We don't know how BCG may affect a developing fetus so it's not advisable to become pregnant or father a child while having it.  It is important to use effective contraception during your treatment and for six weeks afterwards.  You can discuss this with your doctor or specialist nurse.

## 2022-04-01 NOTE — Progress Notes (Signed)
BCG Bladder Instillation  BCG # 1 of 3  Due to Bladder Cancer patient is present today for a BCG treatment. Patient was cleaned and prepped in a sterile fashion with betadine. A 14FR catheter was inserted, urine return was noted 10ml, urine was yellow in color.  50ml of reconstituted BCG was instilled into the bladder. The catheter was then removed. Patient tolerated well, no complications were noted  Performed by: Tylar Amborn, PA-C and Jessica Qualls, CMA  Follow up/ Additional notes: 1 week for BCG #2 of 3   

## 2022-04-08 ENCOUNTER — Ambulatory Visit (INDEPENDENT_AMBULATORY_CARE_PROVIDER_SITE_OTHER): Payer: Medicare PPO | Admitting: Physician Assistant

## 2022-04-08 DIAGNOSIS — C679 Malignant neoplasm of bladder, unspecified: Secondary | ICD-10-CM | POA: Diagnosis not present

## 2022-04-08 DIAGNOSIS — R310 Gross hematuria: Secondary | ICD-10-CM

## 2022-04-08 DIAGNOSIS — D494 Neoplasm of unspecified behavior of bladder: Secondary | ICD-10-CM | POA: Diagnosis not present

## 2022-04-08 LAB — MICROSCOPIC EXAMINATION

## 2022-04-08 LAB — URINALYSIS, COMPLETE
Bilirubin, UA: NEGATIVE
Glucose, UA: NEGATIVE
Ketones, UA: NEGATIVE
Nitrite, UA: NEGATIVE
RBC, UA: NEGATIVE
Specific Gravity, UA: 1.03 (ref 1.005–1.030)
Urobilinogen, Ur: 0.2 mg/dL (ref 0.2–1.0)
pH, UA: 5 (ref 5.0–7.5)

## 2022-04-08 MED ORDER — BCG LIVE 50 MG IS SUSR
3.2400 mL | Freq: Once | INTRAVESICAL | Status: AC
  Administered 2022-04-08: 81 mg via INTRAVESICAL

## 2022-04-08 NOTE — Progress Notes (Signed)
BCG Bladder Instillation  BCG # 2 of 3  Due to Bladder Cancer patient is present today for a BCG treatment. Patient was cleaned and prepped in a sterile fashion with betadine. A 14FR catheter was inserted, urine return was noted 12m, urine was yellow in color.  526mof reconstituted BCG was instilled into the bladder. The catheter was then removed. Patient tolerated well, no complications were noted  Performed by: SaDebroah LoopPA-C and CaElberta LeatherwoodCMA  Follow up/ Additional notes: 1 week for BCG #3 of 3

## 2022-04-11 LAB — CULTURE, URINE COMPREHENSIVE

## 2022-04-15 ENCOUNTER — Ambulatory Visit (INDEPENDENT_AMBULATORY_CARE_PROVIDER_SITE_OTHER): Payer: Medicare PPO | Admitting: Physician Assistant

## 2022-04-15 DIAGNOSIS — C679 Malignant neoplasm of bladder, unspecified: Secondary | ICD-10-CM

## 2022-04-15 LAB — URINALYSIS, COMPLETE
Bilirubin, UA: NEGATIVE
Ketones, UA: NEGATIVE
Nitrite, UA: NEGATIVE
RBC, UA: NEGATIVE
Specific Gravity, UA: 1.03 (ref 1.005–1.030)
Urobilinogen, Ur: 0.2 mg/dL (ref 0.2–1.0)
pH, UA: 5 (ref 5.0–7.5)

## 2022-04-15 LAB — MICROSCOPIC EXAMINATION

## 2022-04-15 MED ORDER — BCG LIVE 50 MG IS SUSR
3.2400 mL | Freq: Once | INTRAVESICAL | Status: AC
  Administered 2022-04-15: 81 mg via INTRAVESICAL

## 2022-05-26 ENCOUNTER — Encounter: Payer: Medicare PPO | Admitting: Internal Medicine

## 2022-05-29 ENCOUNTER — Ambulatory Visit (INDEPENDENT_AMBULATORY_CARE_PROVIDER_SITE_OTHER): Payer: Medicare PPO | Admitting: Internal Medicine

## 2022-05-29 ENCOUNTER — Encounter: Payer: Self-pay | Admitting: Internal Medicine

## 2022-05-29 VITALS — BP 136/74 | HR 62 | Temp 97.7°F | Ht 69.5 in | Wt 203.1 lb

## 2022-05-29 DIAGNOSIS — N1831 Chronic kidney disease, stage 3a: Secondary | ICD-10-CM | POA: Diagnosis not present

## 2022-05-29 DIAGNOSIS — I739 Peripheral vascular disease, unspecified: Secondary | ICD-10-CM

## 2022-05-29 DIAGNOSIS — R7301 Impaired fasting glucose: Secondary | ICD-10-CM

## 2022-05-29 DIAGNOSIS — Z0000 Encounter for general adult medical examination without abnormal findings: Secondary | ICD-10-CM

## 2022-05-29 DIAGNOSIS — N401 Enlarged prostate with lower urinary tract symptoms: Secondary | ICD-10-CM

## 2022-05-29 DIAGNOSIS — E785 Hyperlipidemia, unspecified: Secondary | ICD-10-CM | POA: Diagnosis not present

## 2022-05-29 DIAGNOSIS — I4892 Unspecified atrial flutter: Secondary | ICD-10-CM | POA: Diagnosis not present

## 2022-05-29 NOTE — Assessment & Plan Note (Signed)
Voids okay on tamsuloslin

## 2022-05-29 NOTE — Assessment & Plan Note (Signed)
Will recheck labs BP is okay

## 2022-05-29 NOTE — Progress Notes (Signed)
Subjective:    Patient ID: Brandon Lewis, male    DOB: 04-19-1934, 88 y.o.   MRN: [id number]  HPI Here for Medicare wellness visit and follow up of chronic health conditions Reviewed form and advanced directives Reviewed other doctors Rare alcohol No tobacco products Does try to exercise regularly---elliptical trainer Vision is okay Hearing not great--even with the hearing aides No falls No depression or anhedonia Independent with instrumental ADLs No sig memory issues  Had recurrence of bladder tumor Still getting BCG---though the last biopsy wasn't clearly cancer Having follow up cysto soon  Anemia work up showed polyps No follow up planned  Has had elevated sugars--doesn't test Hard limited sweets--he likes them  No chest pain Hears slight wheeze with exertion--only with hearing aides Does have AM chest congestion Still some claudication--seems to be better (since procedure) No dizziness or syncope Some edema on left--fairly constant (briefly used UNNA boots)  Last GFR 48  Current Outpatient Medications on File Prior to Visit  Medication Sig Dispense Refill   aspirin EC 81 MG tablet Take 81 mg by mouth in the morning. Swallow whole.     calcium carbonate (TUMS - DOSED IN MG ELEMENTAL CALCIUM) 500 MG chewable tablet Chew 1,000 mg by mouth daily as needed for indigestion or heartburn.     Cholecalciferol (VITAMIN D) 50 MCG (2000 UT) CAPS Take 2,000 Units by mouth in the morning.     clopidogrel (PLAVIX) 75 MG tablet *VIAL ONLY* TAKE ONE TABLET BY MOUTH EVERY DAY 90 tablet 3   hydrocortisone (ANUSOL-HC) 2.5 % rectal cream Place 1 application rectally daily as needed for hemorrhoids or anal itching.     MAGNESIUM GLYCINATE PO Take 500 mg by mouth every evening.     metoprolol succinate (TOPROL-XL) 50 MG 24 hr tablet TAKE ONE TABLET BY MOUTH ONCE DAILY (HIGH BLOOD PRESSURE) 30 tablet 11   metroNIDAZOLE (METROGEL) 0.75 % gel Apply 1 application topically 2 (two)  times daily. 45 g 11   Multiple Vitamin (MULTIVITAMIN WITH MINERALS) TABS tablet Take 1 tablet by mouth daily. Centrum Silver     pantoprazole (PROTONIX) 40 MG tablet Take 40 mg by mouth daily as needed (indigestion/heartburn).     rosuvastatin (CRESTOR) 10 MG tablet TAKE ONE TABLET BY MOUTH AT BEDTIME (IMPROVES CHOLESTEROL) 30 tablet 10   sodium chloride (OCEAN) 0.65 % nasal spray Place 1 spray into the nose daily as needed (sinus congestion).     tamsulosin (FLOMAX) 0.4 MG CAPS capsule TAKE 1 CAPSULE BY MOUTH ONCE DAILY 30 capsule 11   TURMERIC CURCUMIN PO Take 2 tablets by mouth daily.     No current facility-administered medications on file prior to visit.    Allergies  Allergen Reactions   Cilostazol Other (See Comments)    Bowel upset   Simvastatin Other (See Comments)    myalgia    Past Medical History:  Diagnosis Date   Anemia    Aortic atherosclerosis (HCC)    Atrial flutter, paroxysmal (HCC)    a.) postoperatively following CABG   Bladder cancer ([place]) 04/2020   BPH (benign prostatic hyperplasia)    CAD (coronary artery disease) 09/12/2019   a.) LHC 09/12/2019: EF 55-60%; 85% m-dLM, 50% pLCx, 85% p-m LAD, 70% pLAD, 45% mLAD, 70% p-mRCA. b.) 4v CABG 09/16/2019.   CKD (chronic kidney disease), stage III (HCC)    Diverticulosis    GERD (gastroesophageal reflux disease)    History of kidney stones    HTN (hypertension)  Hyperlipidemia    Long term current use of antithrombotics/antiplatelets    a.) DAPT therapies (ASA + clopidogrel)   NSTEMI (non-ST elevated myocardial infarction) ([place]) 09/11/2019   a.) LHC 09/12/2019: 85% m-dLM, 50% pLCx, 85% p-m LAD, 70% pLAD, 45% mLAD, 70% p-mRCA; [facility]. Underwent 4v CABG on 09/16/2019.   Osteoarthritis    PVD (peripheral vascular disease) ([place])    a.) s/p aortobifemoral bypass, with bilateral SFA occlusion and intermittent claudication   Right thyroid nodule 09/14/2019   a.) CT 09/14/2019 and  10/16/2020; measured 2.2 cm   S/P CABG x 4 09/16/2019   a.) 4v CABG: LIMA-LAD, SVG-RCA, SVG-OM1, SVG-D1   Small bowel obstruction (HCC)    after surgeries   Thoracic ascending aortic aneurysm ([place])    a.) measured 4.1 cm by CT on 09/14/2019    Past Surgical History:  Procedure Laterality Date   aorto-bifem  2003   [facility]    CAROTID STENT     [facility]   CATARACT EXTRACTION, BILATERAL  2017   CHOLECYSTECTOMY  2002   COLONOSCOPY     COLONOSCOPY WITH PROPOFOL N/A 07/17/2021   Procedure: COLONOSCOPY WITH PROPOFOL;  Surgeon: [name], MD;  Location: [facility] ENDOSCOPY;  Service: Gastroenterology;  Laterality: N/A;   CORONARY ARTERY BYPASS GRAFT N/A 09/16/2019   Procedure: CORONARY ARTERY BYPASS GRAFTING (CABG) using LIMA to LAD; Endoscopic right saphenous vein harvest to OM1, Diag1, and RCA.;  Surgeon: Wonda Olds, MD;  Location: [place];  Service: Open Heart Surgery;  Laterality: N/A;   CYSTOSCOPY WITH BIOPSY N/A 10/08/2021   Procedure: CYSTOSCOPY WITH BLADDER BIOPSY;  Surgeon: Abbie Sons, MD;  Location: [facility] ORS;  Service: Urology;  Laterality: N/A;   ENDARTERECTOMY FEMORAL Left 03/13/2021   Procedure: ENDARTERECTOMY FEMORAL (GROIN REVISION);  Surgeon: Katha Cabal, MD;  Location: [facility] ORS;  Service: Vascular;  Laterality: Left;   LEFT HEART CATH AND CORONARY ANGIOGRAPHY N/A 09/12/2019   Procedure: LEFT HEART CATH AND CORONARY ANGIOGRAPHY;  Surgeon: Corey Skains, MD;  Location: [place] CV LAB;  Service: Cardiovascular;  Laterality: N/A;   LITHOTRIPSY     LOWER EXTREMITY ANGIOGRAPHY Left 01/15/2021   Procedure: LOWER EXTREMITY ANGIOGRAPHY;  Surgeon: Katha Cabal, MD;  Location: [place] CV LAB;  Service: Cardiovascular;  Laterality: Left;   TEE WITHOUT CARDIOVERSION N/A 09/16/2019   Procedure: TRANSESOPHAGEAL ECHOCARDIOGRAM (TEE);  Surgeon: Wonda Olds, MD;  Location: [place];  Service: Open Heart Surgery;  Laterality: N/A;   TONSILLECTOMY      TRANSURETHRAL RESECTION OF BLADDER TUMOR  10/08/2021   Procedure: TRANSURETHRAL RESECTION OF BLADDER TUMOR (TURBT);  Surgeon: Abbie Sons, MD;  Location: [facility] ORS;  Service: Urology;;   TRANSURETHRAL RESECTION OF BLADDER TUMOR WITH MITOMYCIN-C N/A 07/24/2020   Procedure: TRANSURETHRAL RESECTION OF BLADDER TUMOR WITH Gemcitabine;  Surgeon: Abbie Sons, MD;  Location: [facility] ORS;  Service: Urology;  Laterality: N/A;   WISDOM TOOTH EXTRACTION      Family History  Problem Relation Age of Onset   Diabetes Father        DM - and brother    Stroke Father    Coronary artery disease Paternal Grandfather    Colon cancer Neg Hx    Prostate cancer Neg Hx     Social History   Socioeconomic History   Marital status: Married    Spouse name: Not on file   Number of children: 3   Years of education: Not on file  Highest education level: Not on file  Occupational History   Occupation: Landscape architect    Comment: Retired  Tobacco Use   Smoking status: Former    Packs/day: 1.00    Years: 20.00    Total pack years: 20.00    Types: Cigarettes    Quit date: 10/20/1992    Years since quitting: 29.6   Smokeless tobacco: Never   Tobacco comments:    quit in 1994   Vaping Use   Vaping Use: Never used  Substance and Sexual Activity   Alcohol use: Yes    Comment: rare   Drug use: No   Sexual activity: Not on file  Other Topics Concern   Not on file  Social History Narrative   Widowed; then remarried; 3 sons.      Has a living will    Son [name] should be health care POA---with wife's input   Would want resuscitation attempts   Would accept brief trial of artificial nutrition      Pt signed designated party release form and gives Aswad Wandrey [phone] (home #), access to medical records. Can also leave msg on home answering machine. Cell # [phone]   Social Determinants of Health   Financial Resource Strain: Not on file  Food Insecurity: Not on file  Transportation  Needs: Not on file  Physical Activity: Not on file  Stress: Not on file  Social Connections: Not on file  Intimate Partner Violence: Not on file   Review of Systems Appetite is good Weight is stable Sleeps well--does have nocturia x 4-5 per night Wears seat belt Teeth okay---keeps up with dentist Still gets facial rash---metronidazole may help some. No suspicious skin lesion Occ heartburn---uses tums (hasn't needed protonix that much). No dysphagia Bowels are regular. No blood Some sciatica pain on right now--still better from nerve block 1-2 years ago    Objective:   Physical Exam Constitutional:      Appearance: Normal appearance.  HENT:     Mouth/Throat:     Comments: No lesions Eyes:     Conjunctiva/sclera: Conjunctivae normal.     Pupils: Pupils are equal, round, and reactive to light.  Cardiovascular:     Rate and Rhythm: Normal rate and regular rhythm.     Heart sounds: No murmur heard.    No gallop.     Comments: Feet cool without palpable pulses Pulmonary:     Effort: Pulmonary effort is normal.     Breath sounds: Normal breath sounds. No wheezing or rales.  Abdominal:     Palpations: Abdomen is soft.     Tenderness: There is no abdominal tenderness.  Musculoskeletal:     Cervical back: Neck supple.     Right lower leg: No edema.     Left lower leg: No edema.  Lymphadenopathy:     Cervical: No cervical adenopathy.  Skin:    Findings: No lesion.  Neurological:     General: No focal deficit present.     Mental Status: He is alert and oriented to person, place, and time.     Comments: Mini-cog---clock has numbers backwards (dyslexia?), recall 3/3  Psychiatric:        Mood and Affect: Mood normal.        Behavior: Behavior normal.            Assessment & Plan:

## 2022-05-29 NOTE — Assessment & Plan Note (Signed)
Will check A1c

## 2022-05-29 NOTE — Assessment & Plan Note (Signed)
I have personally reviewed the Medicare Annual Wellness questionnaire and have noted 1. The patient's medical and social history 2. Their use of alcohol, tobacco or illicit drugs 3. Their current medications and supplements 4. The patient's functional ability including ADL's, fall risks, home safety risks and hearing or visual             impairment. 5. Diet and physical activities 6. Evidence for depression or mood disorders  The patients weight, height, BMI and visual acuity have been recorded in the chart I have made referrals, counseling and provided education to the patient based review of the above and I have provided the pt with a written personalized care plan for preventive services.  I have provided you with a copy of your personalized plan for preventive services. Please take the time to review along with your updated medication list.  No cancer screening due to age Continues exercise as able Can get Td and shingrix at pharmacy Updated COVID and flu vaccines this fall

## 2022-05-29 NOTE — Assessment & Plan Note (Signed)
Still with mild claudication On asa 81 and plavix 75 crestor 10

## 2022-05-29 NOTE — Assessment & Plan Note (Signed)
On statin Wonders about new meds

## 2022-05-29 NOTE — Assessment & Plan Note (Signed)
No symptoms of recurrence Off amiiodarone

## 2022-05-29 NOTE — Progress Notes (Signed)
Vision Screening   Right eye Left eye Both eyes  Without correction 20/100 20/40 20/40  With correction     Hearing Screening - Comments:: Wears bilateral hearing aids.  Wearing at today's OV.

## 2022-05-30 ENCOUNTER — Other Ambulatory Visit: Payer: Self-pay

## 2022-05-30 LAB — RENAL FUNCTION PANEL
Albumin: 4 g/dL (ref 3.5–5.2)
BUN: 24 mg/dL — ABNORMAL HIGH (ref 6–23)
CO2: 26 mEq/L (ref 19–32)
Calcium: 9.4 mg/dL (ref 8.4–10.5)
Chloride: 106 mEq/L (ref 96–112)
Creatinine, Ser: 1.49 mg/dL (ref 0.40–1.50)
GFR: 42.34 mL/min — ABNORMAL LOW (ref >60.00)
Glucose, Bld: 140 mg/dL — ABNORMAL HIGH (ref 70–99)
Phosphorus: 3.1 mg/dL (ref 2.3–4.6)
Potassium: 4.7 mEq/L (ref 3.5–5.1)
Sodium: 140 mEq/L (ref 135–145)

## 2022-05-30 LAB — CBC
HCT: 34.5 % — ABNORMAL LOW (ref 39.0–52.0)
Hemoglobin: 11.1 g/dL — ABNORMAL LOW (ref 13.0–17.0)
MCHC: 32.1 g/dL (ref 30.0–36.0)
MCV: 119.8 fl — ABNORMAL HIGH (ref 78.0–100.0)
Platelets: 189 10*3/uL (ref 150.0–400.0)
RBC: 2.88 Mil/uL — ABNORMAL LOW (ref 4.22–5.81)
RDW: 17.6 % — ABNORMAL HIGH (ref 11.5–15.5)
WBC: 9.1 10*3/uL (ref 4.0–10.5)

## 2022-05-30 LAB — HEPATIC FUNCTION PANEL
ALT: 15 U/L (ref 0–53)
AST: 21 U/L (ref 0–37)
Albumin: 4 g/dL (ref 3.5–5.2)
Alkaline Phosphatase: 79 U/L (ref 39–117)
Bilirubin, Direct: 0.1 mg/dL (ref 0.0–0.3)
Total Bilirubin: 0.6 mg/dL (ref 0.2–1.2)
Total Protein: 7.7 g/dL (ref 6.0–8.3)

## 2022-05-30 LAB — VITAMIN D 25 HYDROXY (VIT D DEFICIENCY, FRACTURES): VITD: 42.58 ng/mL (ref 30.00–100.00)

## 2022-05-30 LAB — LIPID PANEL
Cholesterol: 111 mg/dL (ref 0–200)
HDL: 28.4 mg/dL — ABNORMAL LOW (ref >39.00)
LDL Cholesterol: 46 mg/dL (ref 0–99)
NonHDL: 82.28
Total CHOL/HDL Ratio: 4
Triglycerides: 180 mg/dL — ABNORMAL HIGH (ref 0.0–149.0)
VLDL: 36 mg/dL (ref 0.0–40.0)

## 2022-05-30 LAB — PARATHYROID HORMONE, INTACT (NO CA): PTH: 92 pg/mL — ABNORMAL HIGH (ref 16–77)

## 2022-05-30 LAB — HEMOGLOBIN A1C: Hgb A1c MFr Bld: 7.7 % — ABNORMAL HIGH (ref 4.6–6.5)

## 2022-05-30 MED ORDER — METFORMIN HCL ER 500 MG PO TB24: 500.0000 mg | ORAL_TABLET | Freq: Every day | ORAL | 11 refills | Status: DC

## 2022-06-16 ENCOUNTER — Telehealth: Payer: Self-pay | Admitting: Internal Medicine

## 2022-06-16 NOTE — Telephone Encounter (Signed)
Too late for antivirals for him and he is feeling okay Will call about his wife

## 2022-06-16 NOTE — Telephone Encounter (Signed)
[name] tested positive 5 days ago, he is feeling better, no fever today and has been checking oxygen level, and it is good  He is concerned about his wife, (note in his wife's chart)  Requesting a call to discuss

## 2022-06-19 ENCOUNTER — Other Ambulatory Visit: Payer: Medicare PPO | Admitting: Urology

## 2022-07-01 DIAGNOSIS — R002 Palpitations: Secondary | ICD-10-CM | POA: Diagnosis not present

## 2022-07-01 DIAGNOSIS — I7121 Aneurysm of the ascending aorta, without rupture: Secondary | ICD-10-CM | POA: Diagnosis not present

## 2022-07-01 DIAGNOSIS — I4892 Unspecified atrial flutter: Secondary | ICD-10-CM | POA: Diagnosis not present

## 2022-07-01 DIAGNOSIS — I739 Peripheral vascular disease, unspecified: Secondary | ICD-10-CM | POA: Diagnosis not present

## 2022-07-01 DIAGNOSIS — Z951 Presence of aortocoronary bypass graft: Secondary | ICD-10-CM | POA: Diagnosis not present

## 2022-07-01 DIAGNOSIS — E785 Hyperlipidemia, unspecified: Secondary | ICD-10-CM | POA: Diagnosis not present

## 2022-07-01 DIAGNOSIS — I25119 Atherosclerotic heart disease of native coronary artery with unspecified angina pectoris: Secondary | ICD-10-CM | POA: Diagnosis not present

## 2022-07-01 DIAGNOSIS — I10 Essential (primary) hypertension: Secondary | ICD-10-CM | POA: Diagnosis not present

## 2022-07-01 DIAGNOSIS — I214 Non-ST elevation (NSTEMI) myocardial infarction: Secondary | ICD-10-CM | POA: Diagnosis not present

## 2022-07-02 ENCOUNTER — Ambulatory Visit (INDEPENDENT_AMBULATORY_CARE_PROVIDER_SITE_OTHER): Payer: Medicare PPO | Admitting: Urology

## 2022-07-02 ENCOUNTER — Encounter: Payer: Self-pay | Admitting: Urology

## 2022-07-02 VITALS — BP 123/71 | HR 90 | Ht 70.0 in | Wt 195.0 lb

## 2022-07-02 DIAGNOSIS — Z8551 Personal history of malignant neoplasm of bladder: Secondary | ICD-10-CM | POA: Diagnosis not present

## 2022-07-02 DIAGNOSIS — D494 Neoplasm of unspecified behavior of bladder: Secondary | ICD-10-CM

## 2022-07-02 LAB — URINALYSIS, COMPLETE
Bilirubin, UA: NEGATIVE
Glucose, UA: NEGATIVE
Ketones, UA: NEGATIVE
Leukocytes,UA: NEGATIVE
Nitrite, UA: NEGATIVE
RBC, UA: NEGATIVE
Specific Gravity, UA: 1.025 (ref 1.005–1.030)
Urobilinogen, Ur: 0.2 mg/dL (ref 0.2–1.0)
pH, UA: 5 (ref 5.0–7.5)

## 2022-07-02 LAB — MICROSCOPIC EXAMINATION

## 2022-07-02 NOTE — Progress Notes (Signed)
   07/02/22  CC:  Chief Complaint  Patient presents with   Cysto    Urologic history: 1.  Ta urothelial carcinoma bladder, high-grade TURBT 07/24/2020; ~ 2 cm right posterior wall tumor Induction BCG completed 11/09/2020 which was tolerated well Maintenance BCG (received only 1 dose May 2022 and doses 2/22 May 2021 due to shortage TURBT 10/08/2021 for abnormal mucosa early papillary change just inside the left bladder neck; Path high-grade Ta Reinduction BCG x6 completed 01/10/2022 Maintenance BCG x3 completed: 04/15/2022   HPI: [name] has no complaints today  Blood pressure 126/73, pulse 85, height '5\' 10"'$  (1.778 m), weight 197 lb (89.4 kg).  Cystoscopy Procedure Note  Patient identification was confirmed, informed consent was obtained, and patient was prepped using Betadine solution.  Lidocaine jelly was administered per urethral meatus.     Pre-Procedure: - Inspection reveals a normal caliber urethral meatus.  Procedure: The flexible cystoscope was introduced without difficulty - No urethral strictures/lesions are present. -  Moderate lateral lobe enlargement  prostate  - Mild elevation bladder neck - Bilateral ureteral orifices identified - Bladder mucosa without erythema, solid or papillary lesions - No bladder stones - Mild trabeculation  Retroflexion shows no abnormalities   Post-Procedure: - Patient tolerated the procedure well  Assessment/ Plan: No endoscopic evidence recurrent urothelial carcinoma Follow-up surveillance cystoscopy 3 months Maintenance BCG due December 2023 (msg sent)    Abbie Sons, MD

## 2022-07-03 ENCOUNTER — Encounter: Payer: Self-pay | Admitting: Urology

## 2022-07-08 DIAGNOSIS — I25119 Atherosclerotic heart disease of native coronary artery with unspecified angina pectoris: Secondary | ICD-10-CM | POA: Diagnosis not present

## 2022-07-08 DIAGNOSIS — R002 Palpitations: Secondary | ICD-10-CM | POA: Diagnosis not present

## 2022-07-08 DIAGNOSIS — R0602 Shortness of breath: Secondary | ICD-10-CM | POA: Diagnosis not present

## 2022-07-08 DIAGNOSIS — I4892 Unspecified atrial flutter: Secondary | ICD-10-CM | POA: Diagnosis not present

## 2022-07-10 DIAGNOSIS — H903 Sensorineural hearing loss, bilateral: Secondary | ICD-10-CM | POA: Diagnosis not present

## 2022-07-21 DIAGNOSIS — I739 Peripheral vascular disease, unspecified: Secondary | ICD-10-CM | POA: Diagnosis not present

## 2022-07-21 DIAGNOSIS — I4892 Unspecified atrial flutter: Secondary | ICD-10-CM | POA: Diagnosis not present

## 2022-07-21 DIAGNOSIS — I25119 Atherosclerotic heart disease of native coronary artery with unspecified angina pectoris: Secondary | ICD-10-CM | POA: Diagnosis not present

## 2022-07-21 DIAGNOSIS — E785 Hyperlipidemia, unspecified: Secondary | ICD-10-CM | POA: Diagnosis not present

## 2022-07-21 DIAGNOSIS — Z951 Presence of aortocoronary bypass graft: Secondary | ICD-10-CM | POA: Diagnosis not present

## 2022-07-21 DIAGNOSIS — I10 Essential (primary) hypertension: Secondary | ICD-10-CM | POA: Diagnosis not present

## 2022-07-21 DIAGNOSIS — I7121 Aneurysm of the ascending aorta, without rupture: Secondary | ICD-10-CM | POA: Diagnosis not present

## 2022-08-18 ENCOUNTER — Encounter (INDEPENDENT_AMBULATORY_CARE_PROVIDER_SITE_OTHER): Payer: Self-pay

## 2022-08-27 ENCOUNTER — Other Ambulatory Visit (INDEPENDENT_AMBULATORY_CARE_PROVIDER_SITE_OTHER): Payer: Self-pay | Admitting: Vascular Surgery

## 2022-08-27 DIAGNOSIS — I739 Peripheral vascular disease, unspecified: Secondary | ICD-10-CM

## 2022-09-01 ENCOUNTER — Ambulatory Visit (INDEPENDENT_AMBULATORY_CARE_PROVIDER_SITE_OTHER): Payer: Medicare PPO | Admitting: Vascular Surgery

## 2022-09-01 ENCOUNTER — Encounter (INDEPENDENT_AMBULATORY_CARE_PROVIDER_SITE_OTHER): Payer: Self-pay | Admitting: Vascular Surgery

## 2022-09-01 ENCOUNTER — Ambulatory Visit (INDEPENDENT_AMBULATORY_CARE_PROVIDER_SITE_OTHER): Payer: Medicare PPO

## 2022-09-01 VITALS — BP 124/70 | HR 90 | Resp 16 | Wt 200.0 lb

## 2022-09-01 DIAGNOSIS — I2510 Atherosclerotic heart disease of native coronary artery without angina pectoris: Secondary | ICD-10-CM | POA: Diagnosis not present

## 2022-09-01 DIAGNOSIS — I739 Peripheral vascular disease, unspecified: Secondary | ICD-10-CM

## 2022-09-01 DIAGNOSIS — I10 Essential (primary) hypertension: Secondary | ICD-10-CM | POA: Diagnosis not present

## 2022-09-01 DIAGNOSIS — M159 Polyosteoarthritis, unspecified: Secondary | ICD-10-CM | POA: Diagnosis not present

## 2022-09-01 DIAGNOSIS — N1831 Chronic kidney disease, stage 3a: Secondary | ICD-10-CM

## 2022-09-01 DIAGNOSIS — I70219 Atherosclerosis of native arteries of extremities with intermittent claudication, unspecified extremity: Secondary | ICD-10-CM | POA: Insufficient documentation

## 2022-09-01 DIAGNOSIS — E785 Hyperlipidemia, unspecified: Secondary | ICD-10-CM | POA: Diagnosis not present

## 2022-09-01 NOTE — Progress Notes (Signed)
MRN : [id number]  Brandon Lewis is a 88 y.o. (June 05, 1934) male who presents with chief complaint of check circulation.  History of Present Illness:   The patient returns to the office for followup and review of the noninvasive studies.   He is s/p redo left common femoral, superficial femoral and profunda femoris endarterectomy with Cormatrix patch angioplasty on 03/13/2021.  There have been no interval changes in lower extremity symptoms.  In fact he feels that he is walking as best as he ever has.  No interval shortening of the patient's claudication distance (he states that remains about the same) no development of rest pain symptoms. No new ulcers or wounds have occurred since the last visit.  There have been no significant changes to the patient's overall health care.  The patient denies amaurosis fugax or recent TIA symptoms. There are no documented recent neurological changes noted. There is no history of DVT, PE or superficial thrombophlebitis. The patient denies recent episodes of angina or shortness of breath.   ABI Rt=0.51 and Lt=0.58  (previous ABI's Rt=0.78 and Lt=0.88)  No outpatient medications have been marked as taking for the 09/01/22 encounter (Appointment) with Delana Meyer, Dolores Lory, MD.    Past Medical History:  Diagnosis Date   Anemia    Aortic atherosclerosis ([place])    Atrial flutter, paroxysmal ([place])    a.) postoperatively following CABG   Bladder cancer ([place]) 04/2020   BPH (benign prostatic hyperplasia)    CAD (coronary artery disease) 09/12/2019   a.) LHC 09/12/2019: EF 55-60%; 85% m-dLM, 50% pLCx, 85% p-m LAD, 70% pLAD, 45% mLAD, 70% p-mRCA. b.) 4v CABG 09/16/2019.   CKD (chronic kidney disease), stage III (HCC)    Diverticulosis    GERD (gastroesophageal reflux disease)    History of kidney stones    HTN (hypertension)    Hyperlipidemia    Long term current use of antithrombotics/antiplatelets    a.) DAPT therapies (ASA +  clopidogrel)   NSTEMI (non-ST elevated myocardial infarction) ([place]) 09/11/2019   a.) LHC 09/12/2019: 85% m-dLM, 50% pLCx, 85% p-m LAD, 70% pLAD, 45% mLAD, 70% p-mRCA; [facility]. Underwent 4v CABG on 09/16/2019.   Osteoarthritis    PVD (peripheral vascular disease) ([place])    a.) s/p aortobifemoral bypass, with bilateral SFA occlusion and intermittent claudication   Right thyroid nodule 09/14/2019   a.) CT 09/14/2019 and 10/16/2020; measured 2.2 cm   S/P CABG x 4 09/16/2019   a.) 4v CABG: LIMA-LAD, SVG-RCA, SVG-OM1, SVG-D1   Small bowel obstruction (HCC)    after surgeries   Thoracic ascending aortic aneurysm ([place])    a.) measured 4.1 cm by CT on 09/14/2019    Past Surgical History:  Procedure Laterality Date   aorto-bifem  2003   [facility]    CAROTID STENT     [facility]   CATARACT EXTRACTION, BILATERAL  2017   CHOLECYSTECTOMY  2002   COLONOSCOPY     COLONOSCOPY WITH PROPOFOL N/A 07/17/2021   Procedure: COLONOSCOPY WITH PROPOFOL;  Surgeon: [name], MD;  Location: [facility] ENDOSCOPY;  Service: Gastroenterology;  Laterality: N/A;   CORONARY ARTERY BYPASS GRAFT N/A 09/16/2019   Procedure: CORONARY ARTERY BYPASS GRAFTING (CABG) using LIMA to LAD; Endoscopic right saphenous vein harvest to OM1, Diag1, and RCA.;  Surgeon: Wonda Olds, MD;  Location: [place];  Service: Open Heart Surgery;  Laterality: N/A;   CYSTOSCOPY  WITH BIOPSY N/A 10/08/2021   Procedure: CYSTOSCOPY WITH BLADDER BIOPSY;  Surgeon: Abbie Sons, MD;  Location: [facility] ORS;  Service: Urology;  Laterality: N/A;   ENDARTERECTOMY FEMORAL Left 03/13/2021   Procedure: ENDARTERECTOMY FEMORAL (GROIN REVISION);  Surgeon: Katha Cabal, MD;  Location: [facility] ORS;  Service: Vascular;  Laterality: Left;   LEFT HEART CATH AND CORONARY ANGIOGRAPHY N/A 09/12/2019   Procedure: LEFT HEART CATH AND CORONARY ANGIOGRAPHY;  Surgeon: Corey Skains, MD;  Location: [place] CV LAB;  Service: Cardiovascular;   Laterality: N/A;   LITHOTRIPSY     LOWER EXTREMITY ANGIOGRAPHY Left 01/15/2021   Procedure: LOWER EXTREMITY ANGIOGRAPHY;  Surgeon: Katha Cabal, MD;  Location: [place] CV LAB;  Service: Cardiovascular;  Laterality: Left;   TEE WITHOUT CARDIOVERSION N/A 09/16/2019   Procedure: TRANSESOPHAGEAL ECHOCARDIOGRAM (TEE);  Surgeon: Wonda Olds, MD;  Location: [place];  Service: Open Heart Surgery;  Laterality: N/A;   TONSILLECTOMY     TRANSURETHRAL RESECTION OF BLADDER TUMOR  10/08/2021   Procedure: TRANSURETHRAL RESECTION OF BLADDER TUMOR (TURBT);  Surgeon: Abbie Sons, MD;  Location: [facility] ORS;  Service: Urology;;   TRANSURETHRAL RESECTION OF BLADDER TUMOR WITH MITOMYCIN-C N/A 07/24/2020   Procedure: TRANSURETHRAL RESECTION OF BLADDER TUMOR WITH Gemcitabine;  Surgeon: Abbie Sons, MD;  Location: [facility] ORS;  Service: Urology;  Laterality: N/A;   WISDOM TOOTH EXTRACTION      Social History Social History   Tobacco Use   Smoking status: Former    Packs/day: 1.00    Years: 20.00    Total pack years: 20.00    Types: Cigarettes    Quit date: 10/20/1992    Years since quitting: 29.8   Smokeless tobacco: Never   Tobacco comments:    quit in 1994   Vaping Use   Vaping Use: Never used  Substance Use Topics   Alcohol use: Yes    Comment: rare   Drug use: No    Family History Family History  Problem Relation Age of Onset   Diabetes Father        DM - and brother    Stroke Father    Coronary artery disease Paternal Grandfather    Colon cancer Neg Hx    Prostate cancer Neg Hx     Allergies  Allergen Reactions   Cilostazol Other (See Comments)    Bowel upset   Simvastatin Other (See Comments)    myalgia     REVIEW OF SYSTEMS (Negative unless checked)  Constitutional: '[]'$ Weight loss  '[]'$ Fever  '[]'$ Chills Cardiac: '[]'$ Chest pain   '[]'$ Chest pressure   '[]'$ Palpitations   '[]'$ Shortness of breath when laying flat   '[]'$ Shortness of breath with exertion. Vascular:  '[x]'$ Pain in  legs with walking   '[]'$ Pain in legs at rest  '[]'$ History of DVT   '[]'$ Phlebitis   '[]'$ Swelling in legs   '[]'$ Varicose veins   '[]'$ Non-healing ulcers Pulmonary:   '[]'$ Uses home oxygen   '[]'$ Productive cough   '[]'$ Hemoptysis   '[]'$ Wheeze  '[]'$ COPD   '[]'$ Asthma Neurologic:  '[]'$ Dizziness   '[]'$ Seizures   '[]'$ History of stroke   '[]'$ History of TIA  '[]'$ Aphasia   '[]'$ Vissual changes   '[]'$ Weakness or numbness in arm   '[]'$ Weakness or numbness in leg Musculoskeletal:   '[]'$ Joint swelling   '[]'$ Joint pain   '[]'$ Low back pain Hematologic:  '[]'$ Easy bruising  '[]'$ Easy bleeding   '[]'$ Hypercoagulable state   '[]'$ Anemic Gastrointestinal:  '[]'$ Diarrhea   '[]'$ Vomiting  '[]'$ Gastroesophageal reflux/heartburn   '[]'$ Difficulty swallowing. Genitourinary:  '[]'$ Chronic kidney  disease   '[]'$ Difficult urination  '[]'$ Frequent urination   '[]'$ Blood in urine Skin:  '[]'$ Rashes   '[]'$ Ulcers  Psychological:  '[]'$ History of anxiety   '[]'$  History of major depression.  Physical Examination  There were no vitals filed for this visit. There is no height or weight on file to calculate BMI. Gen: WD/WN, NAD Head: [place]/AT, No temporalis wasting.  Ear/Nose/Throat: Hearing grossly intact, nares w/o erythema or drainage Eyes: PER, EOMI, sclera nonicteric.  Neck: Supple, no masses.  No bruit or JVD.  Pulmonary:  Good air movement, no audible wheezing, no use of accessory muscles.  Cardiac: RRR, normal S1, S2, no Murmurs. Vascular:  mild trophic changes, no open wounds; no carotid bruits Vessel Right Left  Radial Palpable Palpable  PT Not Palpable Not Palpable  DP Not Palpable Not Palpable  Gastrointestinal: soft, non-distended. No guarding/no peritoneal signs.  Musculoskeletal: M/S 5/5 throughout.  No visible deformity.  Neurologic: CN 2-12 intact. Pain and light touch intact in extremities.  Symmetrical.  Speech is fluent. Motor exam as listed above. Psychiatric: Judgment intact, Mood & affect appropriate for pt's clinical situation. Dermatologic: No rashes or ulcers noted.  No changes consistent with  cellulitis.   CBC Lab Results  Component Value Date   WBC 9.1 05/29/2022   HGB 11.1 (L) 05/29/2022   HCT 34.5 (L) 05/29/2022   MCV 119.8 Repeated and verified X2. (H) 05/29/2022   PLT 189.0 05/29/2022    BMET    Component Value Date/Time   NA 140 05/29/2022 1506   K 4.7 05/29/2022 1506   CL 106 05/29/2022 1506   CO2 26 05/29/2022 1506   GLUCOSE 140 (H) 05/29/2022 1506   BUN 24 (H) 05/29/2022 1506   CREATININE 1.49 05/29/2022 1506   CALCIUM 9.4 05/29/2022 1506   GFRNONAA 46 (L) 10/03/2021 1216   GFRAA 45 (L) 10/01/2019 0327   CrCl cannot be calculated (Patient's most recent lab result is older than the maximum 21 days allowed.).  COAG Lab Results  Component Value Date   INR 1.1 03/05/2021   INR 1.3 (H) 09/16/2019   INR 1.1 09/14/2019    Radiology No results found.   Assessment/Plan 1. 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  The patient does not voice lifestyle limiting changes at this point in time.   Noninvasive studies do not suggest clinically significant change.   No invasive studies, angiography or surgery at this time The patient should continue walking  The patient should continue antiplatelet therapy and aggressive treatment of the lipid abnormalities   No changes in the patient's medications at this time   Continued surveillance is indicated as atherosclerosis is likely to progress with time.     The patient will continue follow up with noninvasive studies as ordered.  Patient will follow-up in 6 months - VAS US AORTA/IVC/ILIACS; Future - VAS [place] ABI WITH/WO TBI; Future  2. Coronary artery disease involving native coronary artery of native heart without angina pectoris Continue cardiac and antihypertensive medications as already ordered and reviewed, no changes at this  time.  Continue statin as ordered and reviewed, no changes at this time  Nitrates PRN for chest pain  3. Essential hypertension Continue antihypertensive medications as already ordered, these medications have been reviewed and there are no changes at this time.  4. Hyperlipidemia, unspecified hyperlipidemia type Continue statin as ordered and reviewed, no changes  at this time  5. Stage 3a chronic kidney disease ([place]) The patient has advanced renal disease.  However, at the present time the patient is not yet on dialysis.  Avoid nephrotoxic medications and dehydration.  Further plans per nephrology  6. Osteoarthritis, generalized Continue NSAID medications as already ordered, these medications have been reviewed and there are no changes at this time.  Continued activity and therapy was stressed.  7. Peripheral vascular disease ([place]) See #1 - VAS [place] ABI WITH/WO TBI    Hortencia Pilar, MD  09/01/2022 12:38 PM

## 2022-09-02 ENCOUNTER — Ambulatory Visit: Payer: Medicare PPO | Admitting: Internal Medicine

## 2022-09-04 ENCOUNTER — Encounter: Payer: Self-pay | Admitting: Internal Medicine

## 2022-09-04 ENCOUNTER — Ambulatory Visit (INDEPENDENT_AMBULATORY_CARE_PROVIDER_SITE_OTHER): Payer: Medicare PPO | Admitting: Internal Medicine

## 2022-09-04 VITALS — BP 124/70 | HR 67 | Temp 97.2°F | Ht 69.5 in | Wt 201.0 lb

## 2022-09-04 DIAGNOSIS — I2510 Atherosclerotic heart disease of native coronary artery without angina pectoris: Secondary | ICD-10-CM

## 2022-09-04 DIAGNOSIS — E1159 Type 2 diabetes mellitus with other circulatory complications: Secondary | ICD-10-CM | POA: Insufficient documentation

## 2022-09-04 DIAGNOSIS — J324 Chronic pansinusitis: Secondary | ICD-10-CM

## 2022-09-04 DIAGNOSIS — I10 Essential (primary) hypertension: Secondary | ICD-10-CM

## 2022-09-04 DIAGNOSIS — J329 Chronic sinusitis, unspecified: Secondary | ICD-10-CM | POA: Insufficient documentation

## 2022-09-04 DIAGNOSIS — R7309 Other abnormal glucose: Secondary | ICD-10-CM | POA: Diagnosis not present

## 2022-09-04 DIAGNOSIS — Z23 Encounter for immunization: Secondary | ICD-10-CM

## 2022-09-04 LAB — POCT GLYCOSYLATED HEMOGLOBIN (HGB A1C): Hemoglobin A1C: 6.5 % — AB (ref 4.0–5.6)

## 2022-09-04 MED ORDER — AMOXICILLIN 500 MG PO TABS: 1000.0000 mg | ORAL_TABLET | Freq: Two times a day (BID) | ORAL | 0 refills | Status: AC

## 2022-09-04 MED ORDER — METFORMIN HCL ER 500 MG PO TB24: 500.0000 mg | ORAL_TABLET | Freq: Every day | ORAL | 0 refills | Status: DC

## 2022-09-04 NOTE — Assessment & Plan Note (Signed)
New diagnosis He is trying to limit sweets Mild issues with metformin A1c is now 6.5% Will continue the metformin '1000mg'$  daily. If more GI problems, can go back down to '500mg'$ 

## 2022-09-04 NOTE — Assessment & Plan Note (Signed)
Going on for 2 years or so Worse now---will give amoxil '1000mg'$  bid for 7 days for worsening He will be more consistent with flonase unless nosebleed

## 2022-09-04 NOTE — Progress Notes (Signed)
Subjective:    Patient ID: Brandon Lewis, male    DOB: January 03, 1934, 88 y.o.   MRN: [id number]  HPI Here for follow up of newly diagnosed diabetes  He has noted some drainage from sinuses This is sporadic for him Some AM nasal bleeding No fever and doesn't feel sick Usually uses guaifenesin and vitamin C. Also flonase No cough--other than from the mucus (thick, golden green) First noticed a few years ago--and this time for about 2 weeks Feels he is better after clearing out in the morning  No significant problems with the metformin He gets twinge of nausea if he hasn't eaten Is up to 2 daily Some effect on bowels--but no diarrhea  Current Outpatient Medications on File Prior to Visit  Medication Sig Dispense Refill   aspirin EC 81 MG tablet Take 81 mg by mouth in the morning. Swallow whole.     calcium carbonate (TUMS - DOSED IN MG ELEMENTAL CALCIUM) 500 MG chewable tablet Chew 1,000 mg by mouth daily as needed for indigestion or heartburn.     Cholecalciferol (VITAMIN D) 50 MCG (2000 UT) CAPS Take 2,000 Units by mouth in the morning.     clopidogrel (PLAVIX) 75 MG tablet *VIAL ONLY* TAKE ONE TABLET BY MOUTH EVERY DAY 90 tablet 3   hydrocortisone (ANUSOL-HC) 2.5 % rectal cream Place 1 application rectally daily as needed for hemorrhoids or anal itching.     MAGNESIUM GLYCINATE PO Take 500 mg by mouth every evening.     metFORMIN (GLUCOPHAGE-XR) 500 MG 24 hr tablet Take 1 tablet (500 mg total) by mouth daily with breakfast. For the 1st 2 weeks, then increase to 2 tablets daily 60 tablet 11   metoprolol succinate (TOPROL-XL) 50 MG 24 hr tablet TAKE ONE TABLET BY MOUTH ONCE DAILY (HIGH BLOOD PRESSURE) 30 tablet 11   metroNIDAZOLE (METROGEL) 0.75 % gel Apply 1 application topically 2 (two) times daily. 45 g 11   Multiple Vitamin (MULTIVITAMIN WITH MINERALS) TABS tablet Take 1 tablet by mouth daily. Centrum Silver     pantoprazole (PROTONIX) 40 MG tablet Take 40 mg by mouth daily as  needed (indigestion/heartburn).     rosuvastatin (CRESTOR) 10 MG tablet TAKE ONE TABLET BY MOUTH AT BEDTIME (IMPROVES CHOLESTEROL) 30 tablet 10   sodium chloride (OCEAN) 0.65 % nasal spray Place 1 spray into the nose daily as needed (sinus congestion).     tamsulosin (FLOMAX) 0.4 MG CAPS capsule TAKE 1 CAPSULE BY MOUTH ONCE DAILY 30 capsule 11   TURMERIC CURCUMIN PO Take 2 tablets by mouth daily.     No current facility-administered medications on file prior to visit.    Allergies  Allergen Reactions   Cilostazol Other (See Comments)    Bowel upset   Simvastatin Other (See Comments)    myalgia    Past Medical History:  Diagnosis Date   Anemia    Aortic atherosclerosis (HCC)    Atrial flutter, paroxysmal (HCC)    a.) postoperatively following CABG   Bladder cancer ([place]) 04/2020   BPH (benign prostatic hyperplasia)    CAD (coronary artery disease) 09/12/2019   a.) LHC 09/12/2019: EF 55-60%; 85% m-dLM, 50% pLCx, 85% p-m LAD, 70% pLAD, 45% mLAD, 70% p-mRCA. b.) 4v CABG 09/16/2019.   CKD (chronic kidney disease), stage III (HCC)    Diverticulosis    GERD (gastroesophageal reflux disease)    History of kidney stones    HTN (hypertension)    Hyperlipidemia    Long term current  use of antithrombotics/antiplatelets    a.) DAPT therapies (ASA + clopidogrel)   NSTEMI (non-ST elevated myocardial infarction) ([place]) 09/11/2019   a.) LHC 09/12/2019: 85% m-dLM, 50% pLCx, 85% p-m LAD, 70% pLAD, 45% mLAD, 70% p-mRCA; transferred to [facility]. Underwent 4v CABG on 09/16/2019.   Osteoarthritis    PVD (peripheral vascular disease) ([place])    a.) s/p aortobifemoral bypass, with bilateral SFA occlusion and intermittent claudication   Right thyroid nodule 09/14/2019   a.) CT 09/14/2019 and 10/16/2020; measured 2.2 cm   S/P CABG x 4 09/16/2019   a.) 4v CABG: LIMA-LAD, SVG-RCA, SVG-OM1, SVG-D1   Small bowel obstruction (HCC)    after surgeries   Thoracic ascending aortic aneurysm  ([place])    a.) measured 4.1 cm by CT on 09/14/2019    Past Surgical History:  Procedure Laterality Date   aorto-bifem  2003   [facility]    CAROTID STENT     [facility]   CATARACT EXTRACTION, BILATERAL  2017   CHOLECYSTECTOMY  2002   COLONOSCOPY     COLONOSCOPY WITH PROPOFOL N/A 07/17/2021   Procedure: COLONOSCOPY WITH PROPOFOL;  Surgeon: [name], MD;  Location: [facility]COPY;  Service: Gastroenterology;  Laterality: N/A;   CORONARY ARTERY BYPASS GRAFT N/A 09/16/2019   Procedure: CORONARY ARTERY BYPASS GRAFTING (CABG) using LIMA to LAD; Endoscopic right saphenous vein harvest to OM1, Diag1, and RCA.;  Surgeon: Wonda Olds, MD;  Location: [facility];  Service: Open Heart Surgery;  Laterality: N/A;   CYSTOSCOPY WITH BIOPSY N/A 10/08/2021   Procedure: CYSTOSCOPY WITH BLADDER BIOPSY;  Surgeon: Abbie Sons, MD;  Location: [facility] ORS;  Service: Urology;  Laterality: N/A;   ENDARTERECTOMY FEMORAL Left 03/13/2021   Procedure: ENDARTERECTOMY FEMORAL (GROIN REVISION);  Surgeon: Katha Cabal, MD;  Location: [facility] ORS;  Service: Vascular;  Laterality: Left;   LEFT HEART CATH AND CORONARY ANGIOGRAPHY N/A 09/12/2019   Procedure: LEFT HEART CATH AND CORONARY ANGIOGRAPHY;  Surgeon: Corey Skains, MD;  Location: [place] CV LAB;  Service: Cardiovascular;  Laterality: N/A;   LITHOTRIPSY     LOWER EXTREMITY ANGIOGRAPHY Left 01/15/2021   Procedure: LOWER EXTREMITY ANGIOGRAPHY;  Surgeon: Katha Cabal, MD;  Location: [place] [facility];  Service: Cardiovascular;  Laterality: Left;   TEE WITHOUT CARDIOVERSION N/A 09/16/2019   Procedure: TRANSESOPHAGEAL ECHOCARDIOGRAM (TEE);  Surgeon: Wonda Olds, MD;  Location: [place];  Service: Open Heart Surgery;  Laterality: N/A;   TONSILLECTOMY     TRANSURETHRAL RESECTION OF BLADDER TUMOR  10/08/2021   Procedure: TRANSURETHRAL RESECTION OF BLADDER TUMOR (TURBT);  Surgeon: Abbie Sons, MD;  Location: [facility] ORS;  Service: Urology;;   TRANSURETHRAL  RESECTION OF BLADDER TUMOR WITH MITOMYCIN-C N/A 07/24/2020   Procedure: TRANSURETHRAL RESECTION OF BLADDER TUMOR WITH Gemcitabine;  Surgeon: Abbie Sons, MD;  Location: [facility] OR[facility];  Service: Urology;  Laterality: N/A;   WISDOM TOOTH EXTRACTION      Family History  Problem Relation Age of Onset   Diabetes Father        DM - and brother    Stroke Father    Coronary artery disease Paternal Grandfather    Colon cancer Neg Hx    Prostate cancer Neg Hx     Social History   Socioeconomic History   Marital status: Married    Spouse name: Not on file   Number of children: 3   Years of education: Not on file   Highest education level: Not on  file  Occupational History   Occupation: Landscape architect    Comment: Retired  Tobacco Use   Smoking status: Former    Packs/day: 1.00    Years: 20.00    Total pack years: 20.00    Types: Cigarettes    Quit date: 10/20/1992    Years since quitting: 29.8    Passive exposure: Never   Smokeless tobacco: Never   Tobacco comments:    quit in 1994   Vaping Use   Vaping Use: Never used  Substance and Sexual Activity   Alcohol use: Yes    Comment: rare   Drug use: No   Sexual activity: Not on file  Other Topics Concern   Not on file  Social History Narrative   Widowed; then remarried; 3 sons.      Has a living will    Son [name] should be health care POA---with wife's input   Would want resuscitation attempts   Would accept brief trial of artificial nutrition      Pt signed designated party release form and gives Keyontae Huckeby [phone] (home #), access to medical records. Can also leave msg on home answering machine. Cell # [phone]   Social Determinants of Health   Financial Resource Strain: Not on file  Food Insecurity: Not on file  Transportation Needs: Not on file  Physical Activity: Not on file  Stress: Not on file  Social Connections: Not on file  Intimate Partner Violence: Not on file   Review of Systems Appetite is  good Trying to be careful with eating     Objective:   Physical Exam Constitutional:      Appearance: Normal appearance.  HENT:     Nose: Congestion present.     Comments: Pale congestion No bleeding spots    Mouth/Throat:     Pharynx: No oropharyngeal exudate or posterior oropharyngeal erythema.  Cardiovascular:     Rate and Rhythm: Normal rate and regular rhythm.     Heart sounds: No murmur heard.    No gallop.  Pulmonary:     Effort: Pulmonary effort is normal.     Breath sounds: Normal breath sounds. No wheezing or rales.  Musculoskeletal:     Cervical back: Neck supple.     Right lower leg: No edema.     Left lower leg: No edema.  Lymphadenopathy:     Cervical: No cervical adenopathy.  Neurological:     Mental Status: He is alert.            Assessment & Plan:

## 2022-09-04 NOTE — Assessment & Plan Note (Signed)
BP Readings from Last 3 Encounters:  09/04/22 124/70  09/01/22 124/70  07/02/22 123/71   Controlled with metoprolol 50 daily

## 2022-09-04 NOTE — Assessment & Plan Note (Signed)
No angina now Same regimen

## 2022-09-30 ENCOUNTER — Ambulatory Visit (LOCAL_COMMUNITY_HEALTH_CENTER): Payer: Medicare PPO

## 2022-09-30 DIAGNOSIS — Z23 Encounter for immunization: Secondary | ICD-10-CM | POA: Diagnosis not present

## 2022-09-30 DIAGNOSIS — Z719 Counseling, unspecified: Secondary | ICD-10-CM

## 2022-09-30 NOTE — Progress Notes (Signed)
  Are you feeling sick today? No   Have you ever received a dose of COVID-19 Vaccine? [employer], [place], [place], [place], Other) Yes  If yes, which vaccine and how many doses?   4 doses Pfizer   Did you bring the vaccination record card or other documentation?  Yes   Do you have a health condition or are undergoing treatment that makes you moderately or severely immunocompromised? This would include, but not be limited to: cancer, HIV, organ transplant, immunosuppressive therapy/high-dose corticosteroids, or moderate/severe primary immunodeficiency.  No  Have you received COVID-19 vaccine before or during hematopoietic cell transplant (HCT) or CAR-T-cell therapies? No  Have you ever had an allergic reaction to: (This would include a severe allergic reaction or a reaction that caused hives, swelling, or respiratory distress, including wheezing.) A component of a COVID-19 vaccine or a previous dose of COVID-19 vaccine? No   Have you ever had an allergic reaction to another vaccine (other thanCOVID-19 vaccine) or an injectable medication? (This would include a severe allergic reaction or a reaction that caused hives, swelling, or respiratory distress, including wheezing.)   No    Do you have a history of any of the following:  Myocarditis or Pericarditis No  Dermal fillers:  No  Multisystem Inflammatory Syndrome (MIS-C or MIS-A)? No  COVID-19 disease within the past 3 months? No  Vaccinated with monkeypox vaccine in the last 4 weeks? No  VIS provided. Comirnaty +12Y 2023-24 IM right deltoid. Required to be given in vehicle.  Tolerated well. COVID card updated. [facility] updated. Waited 15 minutes.

## 2022-10-02 ENCOUNTER — Ambulatory Visit (INDEPENDENT_AMBULATORY_CARE_PROVIDER_SITE_OTHER): Payer: Medicare PPO | Admitting: Urology

## 2022-10-02 ENCOUNTER — Encounter: Payer: Self-pay | Admitting: Urology

## 2022-10-02 VITALS — BP 143/77 | HR 107 | Ht 70.0 in | Wt 200.0 lb

## 2022-10-02 DIAGNOSIS — R310 Gross hematuria: Secondary | ICD-10-CM | POA: Diagnosis not present

## 2022-10-02 DIAGNOSIS — C679 Malignant neoplasm of bladder, unspecified: Secondary | ICD-10-CM | POA: Diagnosis not present

## 2022-10-02 DIAGNOSIS — Z8551 Personal history of malignant neoplasm of bladder: Secondary | ICD-10-CM

## 2022-10-02 DIAGNOSIS — Z08 Encounter for follow-up examination after completed treatment for malignant neoplasm: Secondary | ICD-10-CM

## 2022-10-02 LAB — MICROSCOPIC EXAMINATION

## 2022-10-02 LAB — URINALYSIS, COMPLETE
Bilirubin, UA: NEGATIVE
Glucose, UA: NEGATIVE
Ketones, UA: NEGATIVE
Leukocytes,UA: NEGATIVE
Nitrite, UA: NEGATIVE
RBC, UA: NEGATIVE
Specific Gravity, UA: 1.03 (ref 1.005–1.030)
Urobilinogen, Ur: 0.2 mg/dL (ref 0.2–1.0)
pH, UA: 5 (ref 5.0–7.5)

## 2022-10-02 NOTE — Progress Notes (Signed)
   10/02/22  CC:  Chief Complaint  Patient presents with   Cysto    Urologic history: 1.  Ta urothelial carcinoma bladder, high-grade TURBT 07/24/2020; ~ 2 cm right posterior wall tumor Induction BCG completed 11/09/2020 which was tolerated well Maintenance BCG (received only 1 dose May 2022 and doses 2/22 May 2021 due to shortage TURBT 10/08/2021 for abnormal mucosa early papillary change just inside the left bladder neck; Path high-grade Ta Reinduction BCG x6 completed 01/10/2022 Maintenance BCG x3 completed: 04/15/2022   HPI: [name] has no complaints today  Blood pressure 126/73, pulse 85, height '5\' 10"'$  (1.778 m), weight 197 lb (89.4 kg).  Cystoscopy Procedure Note  Patient identification was confirmed, informed consent was obtained, and patient was prepped using Betadine solution.  Lidocaine jelly was administered per urethral meatus.     Pre-Procedure: - Inspection reveals a normal caliber urethral meatus.  Procedure: The flexible cystoscope was introduced without difficulty - No urethral strictures/lesions are present. -  Moderate lateral lobe enlargement  prostate  - Mild elevation bladder neck - Bilateral ureteral orifices identified - Bladder mucosa without erythema, solid or papillary lesions - No bladder stones - Mild trabeculation  Retroflexion shows no abnormalities   Post-Procedure: - Patient tolerated the procedure well  Assessment/ Plan: No endoscopic evidence recurrent urothelial carcinoma Follow-up surveillance cystoscopy 3 months Maintenance BCG due December 2023/early January 2024    Abbie Sons, MD

## 2022-10-07 ENCOUNTER — Telehealth: Payer: Self-pay | Admitting: *Deleted

## 2022-10-07 NOTE — Telephone Encounter (Signed)
-----   Message from Evelina Bucy, [place] sent at 09/26/2022  3:22 PM EST ----- Regarding: FW: mBCG  ----- Message ----- From: Abbie Sons, MD Sent: 07/03/2022   9:13 AM EST To: Evelina Bucy, CMA Subject: Neal Dy, Mr. Szczesniak is scheduled for cystoscopy 10/02/2022.  Will tentatively need 3-week maintenance BCG scheduled for late December/early January.  Thanks

## 2022-10-07 NOTE — Telephone Encounter (Signed)
All three BCG scheduled for Jan 2024

## 2022-10-28 NOTE — Progress Notes (Signed)
BCG Bladder Instillation  BCG # 1/3  Due to Bladder Cancer patient is present today for a BCG treatment. Patient was cleaned and prepped in a sterile fashion with betadine. A 14 FR catheter was inserted, urine return was noted 120 ml, urine was yellow clear in color.  50ml of reconstituted BCG was instilled into the bladder. The catheter was then removed. Patient tolerated well, no complications were noted  Performed by: Michiel Cowboy, PA-C and Theodis Sato, CMA  Follow up/ Additional notes: Next week for # 2/3 BCG

## 2022-10-29 ENCOUNTER — Encounter: Payer: Self-pay | Admitting: Urology

## 2022-10-29 ENCOUNTER — Ambulatory Visit: Payer: Medicare PPO | Admitting: Urology

## 2022-10-29 DIAGNOSIS — C679 Malignant neoplasm of bladder, unspecified: Secondary | ICD-10-CM | POA: Diagnosis not present

## 2022-10-29 LAB — MICROSCOPIC EXAMINATION

## 2022-10-29 LAB — URINALYSIS, COMPLETE
Bilirubin, UA: NEGATIVE
Ketones, UA: NEGATIVE
Leukocytes,UA: NEGATIVE
Nitrite, UA: NEGATIVE
RBC, UA: NEGATIVE
Specific Gravity, UA: 1.03 (ref 1.005–1.030)
Urobilinogen, Ur: 0.2 mg/dL (ref 0.2–1.0)
pH, UA: 5 (ref 5.0–7.5)

## 2022-10-29 MED ORDER — BCG LIVE 50 MG IS SUSR
3.2400 mL | Freq: Once | INTRAVESICAL | Status: AC
  Administered 2022-10-29: 81 mg via INTRAVESICAL

## 2022-10-30 DIAGNOSIS — H903 Sensorineural hearing loss, bilateral: Secondary | ICD-10-CM | POA: Diagnosis not present

## 2022-11-04 NOTE — Progress Notes (Signed)
BCG Bladder Instillation  BCG # 2/3  Due to Bladder Cancer patient is present today for a BCG treatment. Patient was cleaned and prepped in a sterile fashion with betadine. A 14 FR catheter was inserted, urine return was noted 50 ml, urine was yellow clear in color.  50 ml of reconstituted BCG was instilled into the bladder. The catheter was then removed. Patient tolerated well, no complications were noted  Performed by: Zara Council, PA-C and Sharlee Blew, CMA  Follow up/ Additional notes: Next week for # 3/3 BCG

## 2022-11-05 ENCOUNTER — Ambulatory Visit: Payer: Medicare PPO | Admitting: Urology

## 2022-11-05 DIAGNOSIS — C679 Malignant neoplasm of bladder, unspecified: Secondary | ICD-10-CM

## 2022-11-05 LAB — URINALYSIS, COMPLETE
Bilirubin, UA: NEGATIVE
Glucose, UA: NEGATIVE
Ketones, UA: NEGATIVE
Nitrite, UA: NEGATIVE
RBC, UA: NEGATIVE
Specific Gravity, UA: >1.03 — ABNORMAL HIGH (ref 1.005–1.030)
Urobilinogen, Ur: 0.2 mg/dL (ref 0.2–1.0)
pH, UA: 5 (ref 5.0–7.5)

## 2022-11-05 LAB — MICROSCOPIC EXAMINATION

## 2022-11-05 MED ORDER — BCG LIVE 50 MG IS SUSR
3.2400 mL | Freq: Once | INTRAVESICAL | Status: AC
  Administered 2022-11-05: 81 mg via INTRAVESICAL

## 2022-11-11 NOTE — Progress Notes (Unsigned)
BCG Bladder Instillation  BCG # 3/3  Due to Bladder Cancer patient is present today for a BCG treatment. Patient was cleaned and prepped in a sterile fashion with betadine. A 14 FR catheter was inserted, urine return was noted 100 ml, urine was yellow in color.  34m of reconstituted BCG was instilled into the bladder. The catheter was then removed. Patient tolerated well, no complications were noted  Performed by: SZara Council PA-C and CKyra Manges CMA   Follow up/ Additional notes: Cysto in March w/ Dr. SBernardo Heater

## 2022-11-12 ENCOUNTER — Ambulatory Visit (HOSPITAL_BASED_OUTPATIENT_CLINIC_OR_DEPARTMENT_OTHER): Payer: Medicare PPO | Admitting: Internal Medicine

## 2022-11-12 ENCOUNTER — Ambulatory Visit: Payer: Medicare PPO | Admitting: Urology

## 2022-11-12 DIAGNOSIS — C679 Malignant neoplasm of bladder, unspecified: Secondary | ICD-10-CM | POA: Diagnosis not present

## 2022-11-12 DIAGNOSIS — R310 Gross hematuria: Secondary | ICD-10-CM

## 2022-11-12 LAB — URINALYSIS, COMPLETE
Bilirubin, UA: NEGATIVE
Glucose, UA: NEGATIVE
Ketones, UA: NEGATIVE
Leukocytes,UA: NEGATIVE
Nitrite, UA: NEGATIVE
RBC, UA: NEGATIVE
Specific Gravity, UA: 1.02 (ref 1.005–1.030)
Urobilinogen, Ur: 0.2 mg/dL (ref 0.2–1.0)
pH, UA: 7 (ref 5.0–7.5)

## 2022-11-12 LAB — MICROSCOPIC EXAMINATION

## 2022-11-12 MED ORDER — BCG LIVE 50 MG IS SUSR
3.2400 mL | Freq: Once | INTRAVESICAL | Status: AC
  Administered 2022-11-12: 81 mg via INTRAVESICAL

## 2022-11-26 ENCOUNTER — Other Ambulatory Visit: Payer: Self-pay | Admitting: Internal Medicine

## 2022-12-16 DIAGNOSIS — H35372 Puckering of macula, left eye: Secondary | ICD-10-CM | POA: Diagnosis not present

## 2023-01-01 ENCOUNTER — Encounter: Payer: Self-pay | Admitting: Urology

## 2023-01-01 ENCOUNTER — Ambulatory Visit: Payer: Medicare PPO | Admitting: Urology

## 2023-01-01 VITALS — Ht 70.0 in | Wt 200.0 lb

## 2023-01-01 DIAGNOSIS — C679 Malignant neoplasm of bladder, unspecified: Secondary | ICD-10-CM

## 2023-01-01 DIAGNOSIS — Z08 Encounter for follow-up examination after completed treatment for malignant neoplasm: Secondary | ICD-10-CM

## 2023-01-01 DIAGNOSIS — Z8551 Personal history of malignant neoplasm of bladder: Secondary | ICD-10-CM | POA: Diagnosis not present

## 2023-01-01 LAB — URINALYSIS, COMPLETE
Bilirubin, UA: NEGATIVE
Glucose, UA: NEGATIVE
Ketones, UA: NEGATIVE
Leukocytes,UA: NEGATIVE
Nitrite, UA: NEGATIVE
RBC, UA: NEGATIVE
Specific Gravity, UA: 1.02 (ref 1.005–1.030)
Urobilinogen, Ur: 0.2 mg/dL (ref 0.2–1.0)
pH, UA: 5.5 (ref 5.0–7.5)

## 2023-01-01 LAB — MICROSCOPIC EXAMINATION: Bacteria, UA: NONE SEEN

## 2023-01-01 NOTE — Progress Notes (Signed)
   01/01/23  CC:  Chief Complaint  Patient presents with   Cysto    Urologic history: 1.  Ta urothelial carcinoma bladder, high-grade TURBT 07/24/2020; ~ 2 cm right posterior wall tumor Induction BCG completed 11/09/2020 which was tolerated well Maintenance BCG (received only 1 dose May 2022 and doses 2/22 May 2021 due to shortage TURBT 10/08/2021 for abnormal mucosa early papillary change just inside the left bladder neck; Path high-grade Ta Reinduction BCG x6 completed 01/10/2022 Maintenance BCG x3 completed: 04/15/2022, 11/08/2022   HPI: Brandon Lewis has no complaints today.  Blood pressure 126/73, pulse 85, height 5\' 10"  (1.778 m), weight 197 lb (89.4 kg).  Cystoscopy Procedure Note  Patient identification was confirmed, informed consent was obtained, and patient was prepped using Betadine solution.  Lidocaine jelly was administered per urethral meatus.     Pre-Procedure: - Inspection reveals a normal caliber urethral meatus.  Procedure: The flexible cystoscope was introduced without difficulty - No urethral strictures/lesions are present. -  Moderate lateral lobe enlargement  prostate  - Mild elevation bladder neck - Bilateral ureteral orifices identified - Bladder mucosa without erythema, solid or papillary lesions - No bladder stones - Mild trabeculation  Retroflexion shows no abnormalities   Post-Procedure: - Patient tolerated the procedure well  Assessment/ Plan: No endoscopic evidence recurrent urothelial carcinoma Follow-up surveillance cystoscopy 3 months Maintenance BCG due July 2024    Brandon Sons, MD

## 2023-01-02 DIAGNOSIS — H43813 Vitreous degeneration, bilateral: Secondary | ICD-10-CM | POA: Diagnosis not present

## 2023-01-02 DIAGNOSIS — H21302 Idiopathic cysts of iris, ciliary body or anterior chamber, left eye: Secondary | ICD-10-CM | POA: Diagnosis not present

## 2023-01-26 DIAGNOSIS — I25119 Atherosclerotic heart disease of native coronary artery with unspecified angina pectoris: Secondary | ICD-10-CM | POA: Diagnosis not present

## 2023-01-26 DIAGNOSIS — Z951 Presence of aortocoronary bypass graft: Secondary | ICD-10-CM | POA: Diagnosis not present

## 2023-01-26 DIAGNOSIS — I739 Peripheral vascular disease, unspecified: Secondary | ICD-10-CM | POA: Diagnosis not present

## 2023-01-26 DIAGNOSIS — I10 Essential (primary) hypertension: Secondary | ICD-10-CM | POA: Diagnosis not present

## 2023-01-26 DIAGNOSIS — I4892 Unspecified atrial flutter: Secondary | ICD-10-CM | POA: Diagnosis not present

## 2023-01-26 DIAGNOSIS — I7121 Aneurysm of the ascending aorta, without rupture: Secondary | ICD-10-CM | POA: Diagnosis not present

## 2023-03-02 ENCOUNTER — Encounter (INDEPENDENT_AMBULATORY_CARE_PROVIDER_SITE_OTHER): Payer: Self-pay | Admitting: Vascular Surgery

## 2023-03-02 ENCOUNTER — Ambulatory Visit (INDEPENDENT_AMBULATORY_CARE_PROVIDER_SITE_OTHER): Payer: Medicare PPO | Admitting: Vascular Surgery

## 2023-03-02 ENCOUNTER — Ambulatory Visit (INDEPENDENT_AMBULATORY_CARE_PROVIDER_SITE_OTHER): Payer: Medicare PPO

## 2023-03-02 VITALS — BP 128/80 | HR 77 | Resp 16 | Wt 189.4 lb

## 2023-03-02 DIAGNOSIS — I70219 Atherosclerosis of native arteries of extremities with intermittent claudication, unspecified extremity: Secondary | ICD-10-CM

## 2023-03-02 DIAGNOSIS — I2510 Atherosclerotic heart disease of native coronary artery without angina pectoris: Secondary | ICD-10-CM

## 2023-03-02 DIAGNOSIS — E1151 Type 2 diabetes mellitus with diabetic peripheral angiopathy without gangrene: Secondary | ICD-10-CM | POA: Diagnosis not present

## 2023-03-02 DIAGNOSIS — E785 Hyperlipidemia, unspecified: Secondary | ICD-10-CM

## 2023-03-02 DIAGNOSIS — I10 Essential (primary) hypertension: Secondary | ICD-10-CM

## 2023-03-02 NOTE — Progress Notes (Signed)
MRN : [id number]  Brandon Lewis is a 88 y.o. (02/13/1934) male who presents with chief complaint of check circulation.  History of Present Illness:   The patient returns to the office for followup and review of the noninvasive studies.    He is s/p redo left common femoral, superficial femoral and profunda femoris endarterectomy with Cormatrix patch angioplasty on 03/13/2021.   There have been no interval changes in lower extremity symptoms.  In fact he feels that he is walking as best as he ever has.  No interval shortening of the patient's claudication distance (he states that remains about the same) no development of rest pain symptoms. No new ulcers or wounds have occurred since the last visit.   There have been no significant changes to the patient's overall health care.   The patient denies amaurosis fugax or recent TIA symptoms. There are no documented recent neurological changes noted. There is no history of DVT, PE or superficial thrombophlebitis. The patient denies recent episodes of angina or shortness of breath.    ABI Rt=0.91 and Lt=0.91  (previous ABI's Rt=0.51 and Lt=0.58).  Duplex ultrasound of the abdominal aorta and bilateral iliac arteries shows a widely patent aortobifemoral bypass graft no evidence of hemodynamically significant restenosis.  No outpatient medications have been marked as taking for the 03/02/23 encounter (Appointment) with Gilda Crease, Latina Craver, MD.    Past Medical History:  Diagnosis Date   Anemia    Aortic atherosclerosis (HCC)    Atrial flutter, paroxysmal (HCC)    a.) postoperatively following CABG   Bladder cancer (HCC) 04/2020   BPH (benign prostatic hyperplasia)    CAD (coronary artery disease) 09/12/2019   a.) LHC 09/12/2019: EF 55-60%; 85% m-dLM, 50% pLCx, 85% p-m LAD, 70% pLAD, 45% mLAD, 70% p-mRCA. b.) 4v CABG 09/16/2019.   CKD (chronic kidney disease), stage  III (HCC)    Diverticulosis    GERD (gastroesophageal reflux disease)    History of kidney stones    HTN (hypertension)    Hyperlipidemia    Long term current use of antithrombotics/antiplatelets    a.) DAPT therapies (ASA + clopidogrel)   NSTEMI (non-ST elevated myocardial infarction) (HCC) 09/11/2019   a.) LHC 09/12/2019: 85% m-dLM, 50% pLCx, 85% p-m LAD, 70% pLAD, 45% mLAD, 70% p-mRCA; transferred to [facility] for CVTS consult. Underwent 4v CABG on 09/16/2019.   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   a.) 4v CABG: LIMA-LAD, SVG-RCA, SVG-OM1, SVG-D1   Small bowel obstruction (HCC)    after surgeries   Thoracic ascending aortic aneurysm (HCC)    a.) measured 4.1 cm by CT on 09/14/2019    Past Surgical History:  Procedure Laterality Date   aorto-bifem  2003   [facility]    CAROTID STENT     [facility]   CATARACT EXTRACTION, BILATERAL  2017   CHOLECYSTECTOMY  2002  COLONOSCOPY     COLONOSCOPY WITH PROPOFOL N/A 07/17/2021   Procedure: COLONOSCOPY WITH PROPOFOL;  Surgeon: Wyline Mood, MD;  Location: [facility] ENDOSCOPY;  Service: Gastroenterology;  Laterality: N/A;   CORONARY ARTERY BYPASS GRAFT N/A 09/16/2019   Procedure: CORONARY ARTERY BYPASS GRAFTING (CABG) using LIMA to LAD; Endoscopic right saphenous vein harvest to OM1, Diag1, and RCA.;  Surgeon: Linden Dolin, MD;  Location: [facility] OR;  Service: Open Heart Surgery;  Laterality: N/A;   CYSTOSCOPY WITH BIOPSY N/A 10/08/2021   Procedure: CYSTOSCOPY WITH BLADDER BIOPSY;  Surgeon: Riki Altes, MD;  Location: [facility] ORS;  Service: Urology;  Laterality: N/A;   ENDARTERECTOMY FEMORAL Left 03/13/2021   Procedure: ENDARTERECTOMY FEMORAL (GROIN REVISION);  Surgeon: Renford Dills, MD;  Location: [facility] ORS;  Service:  Vascular;  Laterality: Left;   LEFT HEART CATH AND CORONARY ANGIOGRAPHY N/A 09/12/2019   Procedure: LEFT HEART CATH AND CORONARY ANGIOGRAPHY;  Surgeon: Lamar Blinks, MD;  Location: [facility] INVASIVE CV LAB;  Service: Cardiovascular;  Laterality: N/A;   LITHOTRIPSY     LOWER EXTREMITY ANGIOGRAPHY Left 01/15/2021   Procedure: LOWER EXTREMITY ANGIOGRAPHY;  Surgeon: Renford Dills, MD;  Location: [facility] INVASIVE CV LAB;  Service: Cardiovascular;  Laterality: Left;   TEE WITHOUT CARDIOVERSION N/A 09/16/2019   Procedure: TRANSESOPHAGEAL ECHOCARDIOGRAM (TEE);  Surgeon: Linden Dolin, MD;  Location[facility] OR;  Service: Open Heart Surgery;  Laterality: N/A;   TONSILLECTOMY     TRANSURETHRAL RESECTION OF BLADDER TUMOR  10/08/2021   Procedure: TRANSURETHRAL RESECTION OF BLADDER TUMOR (TURBT);  Surgeon: Riki Altes, MD;  Location: [facility] ORS;  Service: Urology;;   TRANSURETHRAL RESECTION OF BLADDER TUMOR WITH MITOMYCIN-C N/A 07/24/2020   Procedure: TRANSURETHRAL RESECTION OF BLADDER TUMOR WITH Gemcitabine;  Surgeon: Riki Altes, MD;  Location: [facility] ORS;  Service: Urology;  Laterality: N/A;   WISDOM TOOTH EXTRACTION      Social History Social History   Tobacco Use   Smoking status: Former    Packs/day: 1.00    Years: 20.00    Additional pack years: 0.00    Total pack years: 20.00    Types: Cigarettes    Quit date: 10/20/1992    Years since quitting: 30.3    Passive exposure: Never   Smokeless tobacco: Never   Tobacco comments:    quit in 1994   Vaping Use   Vaping Use: Never used  Substance Use Topics   Alcohol use: Yes    Comment: rare   Drug use: No    Family History Family History  Problem Relation Age of Onset   Diabetes Father        DM - and brother    Stroke Father    Coronary artery disease Paternal Grandfather    Colon cancer Neg Hx    Prostate cancer Neg Hx     Allergies  Allergen Reactions   Cilostazol Other (See Comments)    Bowel upset   Simvastatin  Other (See Comments)    myalgia     REVIEW OF SYSTEMS (Negative unless checked)  Constitutional: [] Weight loss  [] Fever  [] Chills Cardiac: [] Chest pain   [] Chest pressure   [] Palpitations   [] Shortness of breath when laying flat   [] Shortness of breath with exertion. Vascular:  [x] Pain in legs with walking   [] Pain in legs at rest  [] History of DVT   [] Phlebitis   [] Swelling in legs   [] Varicose veins   [] Non-healing ulcers Pulmonary:   [] Uses home oxygen   []   Productive cough   [] Hemoptysis   [] Wheeze  [] COPD   [] Asthma Neurologic:  [] Dizziness   [] Seizures   [] History of stroke   [] History of TIA  [] Aphasia   [] Vissual changes   [] Weakness or numbness in arm   [] Weakness or numbness in leg Musculoskeletal:   [] Joint swelling   [] Joint pain   [] Low back pain Hematologic:  [] Easy bruising  [] Easy bleeding   [] Hypercoagulable state   [] Anemic Gastrointestinal:  [] Diarrhea   [] Vomiting  [] Gastroesophageal reflux/heartburn   [] Difficulty swallowing. Genitourinary:  [] Chronic kidney disease   [] Difficult urination  [] Frequent urination   [] Blood in urine Skin:  [] Rashes   [] Ulcers  Psychological:  [] History of anxiety   []  History of major depression.  Physical Examination  There were no vitals filed for this visit. There is no height or weight on file to calculate BMI. Gen: WD/WN, NAD Head: [place]/AT, No temporalis wasting.  Ear/Nose/Throat: Hearing grossly intact, nares w/o erythema or drainage Eyes: PER, EOMI, sclera nonicteric.  Neck: Supple, no masses.  No bruit or JVD.  Pulmonary:  Good air movement, no audible wheezing, no use of accessory muscles.  Cardiac: RRR, normal S1, S2, no Murmurs. Vascular:  mild trophic changes, no open wounds Vessel Right Left  Radial Palpable Palpable  PT Not Palpable Not Palpable  DP Not Palpable Not Palpable  Gastrointestinal: soft, non-distended. No guarding/no peritoneal signs.  Musculoskeletal: M/S 5/5 throughout.  No visible deformity.   Neurologic: CN 2-12 intact. Pain and light touch intact in extremities.  Symmetrical.  Speech is fluent. Motor exam as listed above. Psychiatric: Judgment intact, Mood & affect appropriate for pt's clinical situation. Dermatologic: No rashes or ulcers noted.  No changes consistent with cellulitis.   CBC Lab Results  Component Value Date   WBC 9.1 05/29/2022   HGB 11.1 (L) 05/29/2022   HCT 34.5 (L) 05/29/2022   MCV 119.8 Repeated and verified X2. (H) 05/29/2022   PLT 189.0 05/29/2022    BMET    Component Value Date/Time   NA 140 05/29/2022 1506   K 4.7 05/29/2022 1506   CL 106 05/29/2022 1506   CO2 26 05/29/2022 1506   GLUCOSE 140 (H) 05/29/2022 1506   BUN 24 (H) 05/29/2022 1506   CREATININE 1.49 05/29/2022 1506   CALCIUM 9.4 05/29/2022 1506   GFRNONAA 46 (L) 10/03/2021 1216   GFRAA 45 (L) 10/01/2019 0327   CrCl cannot be calculated (Patient's most recent lab result is older than the maximum 21 days allowed.).  COAG Lab Results  Component Value Date   INR 1.1 03/05/2021   INR 1.3 (H) 09/16/2019   INR 1.1 09/14/2019    Radiology No results found.   Assessment/Plan 1. 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  The patient does not voice lifestyle limiting changes at this point in time.  Noninvasive studies do not suggest clinically significant change.  No invasive studies, angiography or surgery at this time The patient should continue walking and begin a more formal exercise program.  The patient should continue antiplatelet therapy and aggressive treatment of the lipid abnormalities  No changes in the patient's medications at this time  Continued surveillance is indicated as atherosclerosis is likely to progress with time.    The patient will continue follow up with noninvasive studies as  ordered.  - VAS [place] ABI WITH/WO TBI; Future  2. Essential  hypertension Continue antihypertensive medications as already ordered, these medications have been reviewed and there are no changes at this time.  3. Coronary artery disease involving native coronary artery of native heart without angina pectoris Continue cardiac and antihypertensive medications as already ordered and reviewed, no changes at this time.  Continue statin as ordered and reviewed, no changes at this time  Nitrates PRN for chest pain  4. Hyperlipidemia, unspecified hyperlipidemia type Continue statin as ordered and reviewed, no changes at this time  5. Controlled type 2 diabetes mellitus with diabetic peripheral angiopathy without gangrene, without long-term current use of insulin (HCC) Continue hypoglycemic medications as already ordered, these medications have been reviewed and there are no changes at this time.  Hgb A1C to be monitored as already arranged by primary service    Levora Dredge, MD  03/02/2023 1:24 PM

## 2023-03-03 ENCOUNTER — Other Ambulatory Visit: Payer: Self-pay | Admitting: Internal Medicine

## 2023-03-03 NOTE — Telephone Encounter (Signed)
Refill request Metronidazole Last refill 05/22/21  45 G/11 refills Last office visit 09/04/22 Upcoming appointment 03/06/23

## 2023-03-04 DIAGNOSIS — H903 Sensorineural hearing loss, bilateral: Secondary | ICD-10-CM | POA: Diagnosis not present

## 2023-03-04 LAB — VAS US ABI WITH/WO TBI
Left ABI: 0.91
Right ABI: 0.91

## 2023-03-05 ENCOUNTER — Ambulatory Visit: Payer: Medicare PPO | Admitting: Internal Medicine

## 2023-03-06 ENCOUNTER — Encounter: Payer: Self-pay | Admitting: Internal Medicine

## 2023-03-06 ENCOUNTER — Ambulatory Visit (INDEPENDENT_AMBULATORY_CARE_PROVIDER_SITE_OTHER): Payer: Medicare PPO | Admitting: Internal Medicine

## 2023-03-06 ENCOUNTER — Ambulatory Visit: Payer: Medicare PPO | Admitting: Physician Assistant

## 2023-03-06 VITALS — BP 108/70 | HR 72 | Temp 98.4°F | Ht 70.0 in | Wt 187.8 lb

## 2023-03-06 DIAGNOSIS — Z7984 Long term (current) use of oral hypoglycemic drugs: Secondary | ICD-10-CM

## 2023-03-06 DIAGNOSIS — E1159 Type 2 diabetes mellitus with other circulatory complications: Secondary | ICD-10-CM

## 2023-03-06 DIAGNOSIS — C679 Malignant neoplasm of bladder, unspecified: Secondary | ICD-10-CM | POA: Diagnosis not present

## 2023-03-06 DIAGNOSIS — I739 Peripheral vascular disease, unspecified: Secondary | ICD-10-CM | POA: Diagnosis not present

## 2023-03-06 DIAGNOSIS — I2510 Atherosclerotic heart disease of native coronary artery without angina pectoris: Secondary | ICD-10-CM | POA: Diagnosis not present

## 2023-03-06 DIAGNOSIS — R350 Frequency of micturition: Secondary | ICD-10-CM | POA: Diagnosis not present

## 2023-03-06 DIAGNOSIS — N390 Urinary tract infection, site not specified: Secondary | ICD-10-CM | POA: Insufficient documentation

## 2023-03-06 LAB — POC URINALSYSI DIPSTICK (AUTOMATED)
Bilirubin, UA: NEGATIVE
Blood, UA: POSITIVE
Glucose, UA: NEGATIVE
Ketones, UA: NEGATIVE
Nitrite, UA: NEGATIVE
Protein, UA: POSITIVE — AB
Spec Grav, UA: 1.02 (ref 1.010–1.025)
Urobilinogen, UA: 0.2 E.U./dL
pH, UA: 5.5 (ref 5.0–8.0)

## 2023-03-06 LAB — POCT GLYCOSYLATED HEMOGLOBIN (HGB A1C): Hemoglobin A1C: 5.5 % (ref 4.0–5.6)

## 2023-03-06 MED ORDER — SULFAMETHOXAZOLE-TRIMETHOPRIM 800-160 MG PO TABS: 1.0000 | ORAL_TABLET | Freq: Two times a day (BID) | ORAL | 0 refills | Status: DC

## 2023-03-06 MED ORDER — TAMSULOSIN HCL 0.4 MG PO CAPS: 0.4000 mg | ORAL_CAPSULE | Freq: Every day | ORAL | 3 refills | Status: DC

## 2023-03-06 NOTE — Assessment & Plan Note (Signed)
Seems to be low level symptoms--going on for 2 weeks---so probably just cystitits May be related to recent BCG Will send culture since 3+ leuks Empiric Rx with bactrim DS 1 bid for 7 days

## 2023-03-06 NOTE — Assessment & Plan Note (Signed)
A1c now 5.5% Is eating healthier On metformin 1000 daily--no changes needed

## 2023-03-06 NOTE — Progress Notes (Signed)
Subjective:    Patient ID: Brandon Lewis, male    DOB: April 06, 1934, 89 y.o.   MRN: [id number]  HPI Here for follow up of diabetes and other chronic health conditions  Checks sugars  Usually 138-178 Continues on the metformin 1000mg  daily  Now having burning and increased frequency of urination Started about 2 weeks ago No fever No blood Last BCG was a few months ago  No chest pain No SOB No syncope-- some dizziness if he jumps up quick  Still gets leg claudication when he walks enough--but it seems better Recent visit with Dr Lorretta Harp  Current Outpatient Medications on File Prior to Visit  Medication Sig Dispense Refill   aspirin EC 81 MG tablet Take 81 mg by mouth in the morning. Swallow whole.     calcium carbonate (TUMS - DOSED IN MG ELEMENTAL CALCIUM) 500 MG chewable tablet Chew 1,000 mg by mouth daily as needed for indigestion or heartburn.     Cholecalciferol (VITAMIN D) 50 MCG (2000 UT) CAPS Take 2,000 Units by mouth in the morning.     clopidogrel (PLAVIX) 75 MG tablet *VIAL ONLY* TAKE ONE TABLET BY MOUTH EVERY DAY 90 tablet 3   hydrocortisone (ANUSOL-HC) 2.5 % rectal cream Place 1 application rectally daily as needed for hemorrhoids or anal itching.     MAGNESIUM GLYCINATE PO Take 500 mg by mouth every evening.     metFORMIN (GLUCOPHAGE-XR) 500 MG 24 hr tablet Take 1-2 tablets (500-1,000 mg total) by mouth daily with breakfast. 1 tablet 0   metoprolol succinate (TOPROL-XL) 50 MG 24 hr tablet TAKE ONE TABLET BY MOUTH ONCE DAILY (HIGH BLOOD PRESSURE) 30 tablet 11   metroNIDAZOLE (METROGEL) 0.75 % gel APPLY 1 APPLICATION TOPICALLY TWICE DAILY 45 g 11   Multiple Vitamin (MULTIVITAMIN WITH MINERALS) TABS tablet Take 1 tablet by mouth daily. Centrum Silver     NEXLIZET 180-10 MG TABS Take 1 tablet by mouth daily.     pantoprazole (PROTONIX) 40 MG tablet Take 40 mg by mouth daily as needed (indigestion/heartburn).     sodium chloride (OCEAN) 0.65 % nasal spray Place 1  spray into the nose daily as needed (sinus congestion).     tamsulosin (FLOMAX) 0.4 MG CAPS capsule TAKE 1 CAPSULE BY MOUTH ONCE DAILY 30 capsule 11   TURMERIC CURCUMIN PO Take 2 tablets by mouth daily.     No current facility-administered medications on file prior to visit.    Allergies  Allergen Reactions   Cilostazol Other (See Comments)    Bowel upset   Simvastatin Other (See Comments)    myalgia    Past Medical History:  Diagnosis Date   Anemia    Aortic atherosclerosis (HCC)    Atrial flutter, paroxysmal (HCC)    a.) postoperatively following CABG   Bladder cancer (HCC) 04/2020   BPH (benign prostatic hyperplasia)    CAD (coronary artery disease) 09/12/2019   a.) LHC 09/12/2019: EF 55-60%; 85% m-dLM, 50% pLCx, 85% p-m LAD, 70% pLAD, 45% mLAD, 70% p-mRCA. b.) 4v CABG 09/16/2019.   CKD (chronic kidney disease), stage III (HCC)    Diverticulosis    GERD (gastroesophageal reflux disease)    History of kidney stones    HTN (hypertension)    Hyperlipidemia    Long term current use of antithrombotics/antiplatelets    a.) DAPT therapies (ASA + clopidogrel)   NSTEMI (non-ST elevated myocardial infarction) (HCC) 09/11/2019   a.) LHC 09/12/2019: 85% m-dLM, 50% pLCx, 85% p-m LAD, 70% pLAD,  45% mLAD, 70% p-mRCA; transferred to [facility] for CVTS consult. Underwent 4v CABG on 09/16/2019.   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   a.) 4v CABG: LIMA-LAD, SVG-RCA, SVG-OM1, SVG-D1   Small bowel obstruction (HCC)    after surgeries   Thoracic ascending aortic aneurysm (HCC)    a.) measured 4.1 cm by CT on 09/14/2019    Past Surgical History:  Procedure Laterality Date   aorto-bifem  2003   [facility]    CAROTID STENT     [facility]   CATARACT EXTRACTION, BILATERAL   2017   CHOLECYSTECTOMY  2002   COLONOSCOPY     COLONOSCOPY WITH PROPOFOL N/A 07/17/2021   Procedure: COLONOSCOPY WITH PROPOFOL;  Surgeon: Wyline Mood, MD;  Location: [facility] ENDOSCOPY;  Service: Gastroenterology;  Laterality: N/A;   CORONARY ARTERY BYPASS GRAFT N/A 09/16/2019   Procedure: CORONARY ARTERY BYPASS GRAFTING (CABG) using LIMA to LAD; Endoscopic right saphenous vein harvest to OM1, Diag1, and RCA.;  Surgeon: Linden Dolin, MD;  Location: [facility] OR;  Service: Open Heart Surgery;  Laterality: N/A;   CYSTOSCOPY WITH BIOPSY N/A 10/08/2021   Procedure: CYSTOSCOPY WITH BLADDER BIOPSY;  Surgeon: Riki Altes, MD;  Location: [facility] ORS;  Service: Urology;  Laterality: N/A;   ENDARTERECTOMY FEMORAL Left 03/13/2021   Procedure: ENDARTERECTOMY FEMORAL (GROIN REVISION);  Surgeon: Renford Dills, MD;  Location: [facility] ORS;  Service: Vascular;  Laterality: Left;   LEFT HEART CATH AND CORONARY ANGIOGRAPHY N/A 09/12/2019   Procedure: LEFT HEART CATH AND CORONARY ANGIOGRAPHY;  Surgeon: Lamar Blinks, MD;  Location: [facility] INVASIVE CV LAB;  Service: Cardiovascular;  Laterality: N/A;   LITHOTRIPSY     LOWER EXTREMITY ANGIOGRAPHY Left 01/15/2021   Procedure: LOWER EXTREMITY ANGIOGRAPHY;  Surgeon: Renford Dills, MD;  Location: [facility] INVASIVE CV LAB;  Service: Cardiovascular;  Laterality: Left;   TEE WITHOUT CARDIOVERSION N/A 09/16/2019   Procedure: TRANSESOPHAGEAL ECHOCARDIOGRAM (TEE);  Surgeon: Linden Dolin, MD;  Location: [facility] OR;  Service: Open Heart Surgery;  Laterality: N/A;   TONSILLECTOMY     TRANSURETHRAL RESECTION OF BLADDER TUMOR  10/08/2021   Procedure: TRANSURETHRAL RESECTION OF BLADDER TUMOR (TURBT);  Surgeon: Riki Altes, MD;  Location: [facility] ORS;  Service: Urology;;   TRANSURETHRAL RESECTION OF BLADDER TUMOR WITH MITOMYCIN-C N/A 07/24/2020   Procedure: TRANSURETHRAL RESECTION OF BLADDER TUMOR WITH Gemcitabine;  Surgeon: Riki Altes, MD;  Location: [facility] ORS;  Service:  Urology;  Laterality: N/A;   WISDOM TOOTH EXTRACTION      Family History  Problem Relation Age of Onset   Diabetes Father        DM - and brother    Stroke Father    Coronary artery disease Paternal Grandfather    Colon cancer Neg Hx    Prostate cancer Neg Hx     Social History   Socioeconomic History   Marital status: Married    Spouse name: Not on file   Number of children: 3   Years of education: Not on file   Highest education level: Professional school degree (e.g., MD, DDS, DVM, JD)  Occupational History   Occupation: Merchant navy officer    Comment: Retired  Tobacco Use   Smoking status: Former    Packs/day: 1.00  Years: 20.00    Additional pack years: 0.00    Total pack years: 20.00    Types: Cigarettes    Quit date: 10/20/1992    Years since quitting: 30.3    Passive exposure: Never   Smokeless tobacco: Never   Tobacco comments:    quit in 1994   Vaping Use   Vaping Use: Never used  Substance and Sexual Activity   Alcohol use: Yes    Comment: rare   Drug use: No   Sexual activity: Not on file  Other Topics Concern   Not on file  Social History Narrative   Widowed; then remarried; 3 sons.      Has a living will    Son Brandon Lewis should be health care POA---with wife's input   Would want resuscitation attempts   Would accept brief trial of artificial nutrition      Pt signed designated party release form and gives Brandon Lewis [phone] (home #), access to medical records. Can also leave msg on home answering machine. Cell # [phone]   Social Determinants of Health   Financial Resource Strain: Low Risk  (03/04/2023)   Overall Financial Resource Strain (CARDIA)    Difficulty of Paying Living Expenses: Not very hard  Food Insecurity: No Food Insecurity (03/04/2023)   Hunger Vital Sign    Worried About Running Out of Food in the Last Year: Never true    Ran Out of Food in the Last Year: Never true  Transportation Needs: No Transportation Needs  (03/04/2023)   PRAPARE - Administrator, Civil Service (Medical): No    Lack of Transportation (Non-Medical): No  Physical Activity: Insufficiently Active (03/04/2023)   Exercise Vital Sign    Days of Exercise per Week: 3 days    Minutes of Exercise per Session: 30 min  Stress: No Stress Concern Present (03/04/2023)   [employer] of Occupational Health - Occupational Stress Questionnaire    Feeling of Stress : Not at all  Social Connections: Socially Integrated (03/04/2023)   Social Connection and Isolation Panel [NHANES]    Frequency of Communication with Friends and Family: More than three times a week    Frequency of Social Gatherings with Friends and Family: Once a week    Attends Religious Services: More than 4 times per year    Active Member of [employer] or Organizations: Yes    Attends Engineer, structural: Not on file    Marital Status: Married  Catering manager Violence: Not on file   Review of Systems Sleeps well Appetite is good Weight is down--being careful with his eating    Objective:   Physical Exam Cardiovascular:     Rate and Rhythm: Normal rate and regular rhythm.     Heart sounds: No murmur heard.    No gallop.     Comments: Feet warm without palpable pulses Pulmonary:     Effort: Pulmonary effort is normal.     Breath sounds: Normal breath sounds. No wheezing or rales.  Abdominal:     Palpations: Abdomen is soft.     Tenderness: There is no abdominal tenderness.  Skin:    Comments: No foot lesions  Psychiatric:        Mood and Affect: Mood normal.        Behavior: Behavior normal.            Assessment & Plan:

## 2023-03-06 NOTE — Assessment & Plan Note (Signed)
Stable claudication--or slightly better

## 2023-03-06 NOTE — Assessment & Plan Note (Signed)
Thinks he is done with BCG but needs another cystoscopy by Dr Lonna Cobb

## 2023-03-06 NOTE — Assessment & Plan Note (Signed)
No recent angina On plavix, nexlizet, ASA, rosuvastatin, metoprolol 50

## 2023-03-07 LAB — URINE CULTURE
MICRO NUMBER:: 14971602
SPECIMEN QUALITY:: ADEQUATE

## 2023-03-08 ENCOUNTER — Encounter (INDEPENDENT_AMBULATORY_CARE_PROVIDER_SITE_OTHER): Payer: Self-pay | Admitting: Vascular Surgery

## 2023-03-23 ENCOUNTER — Telehealth: Payer: Self-pay | Admitting: Internal Medicine

## 2023-03-23 NOTE — Telephone Encounter (Signed)
Pt called requesting to speak to [facility] regarding his bladder. Pt stated he believes he may have a UTI & needs to speak to [facility] for further advice. Call back # [phone]

## 2023-03-24 ENCOUNTER — Telehealth: Payer: Self-pay

## 2023-03-24 NOTE — Telephone Encounter (Signed)
Return call to pt.  Pt. States Sunday night he started feeling bad - fever 101 and frequent urinating.  States he sent a message to his PCP but was told he is out of town this week.  Reports he felt good Monday morning and no fever.  States he thinks he needs to be seen because he is still having frequent urinating- current temp 99.0  Next available appt with Hilton Sinclair, PA-C scheduled 03/27/23.  Patient instructed to go to urgent care if his symptoms worsened or fever 101.  Pt. Verbalized understanding.

## 2023-03-24 NOTE — Telephone Encounter (Signed)
Patient LM on triage line stating he has a UTI. 

## 2023-03-27 ENCOUNTER — Ambulatory Visit: Payer: Medicare PPO | Admitting: Physician Assistant

## 2023-03-27 ENCOUNTER — Encounter: Payer: Self-pay | Admitting: Physician Assistant

## 2023-03-27 VITALS — BP 127/75 | HR 85 | Ht 70.0 in | Wt 187.0 lb

## 2023-03-27 DIAGNOSIS — N400 Enlarged prostate without lower urinary tract symptoms: Secondary | ICD-10-CM

## 2023-03-27 DIAGNOSIS — R300 Dysuria: Secondary | ICD-10-CM | POA: Diagnosis not present

## 2023-03-27 DIAGNOSIS — R310 Gross hematuria: Secondary | ICD-10-CM | POA: Diagnosis not present

## 2023-03-27 DIAGNOSIS — N3281 Overactive bladder: Secondary | ICD-10-CM

## 2023-03-27 LAB — URINALYSIS, COMPLETE
Bilirubin, UA: NEGATIVE
Glucose, UA: NEGATIVE
Leukocytes,UA: NEGATIVE
Nitrite, UA: NEGATIVE
RBC, UA: NEGATIVE
Specific Gravity, UA: >1.03 — ABNORMAL HIGH (ref 1.005–1.030)
Urobilinogen, Ur: 0.2 mg/dL (ref 0.2–1.0)
pH, UA: 5 (ref 5.0–7.5)

## 2023-03-27 LAB — BLADDER SCAN AMB NON-IMAGING

## 2023-03-27 LAB — MICROSCOPIC EXAMINATION

## 2023-03-27 NOTE — Progress Notes (Unsigned)
03/27/2023 11:34 AM   Brandon Lewis [birth year] [id number]  CC: Chief Complaint  Patient presents with   Follow-up   Urinary Tract Infection   HPI: Brandon Lewis is a 89 y.o. male with PMH BPH on Flomax and nonmuscle invasive bladder cancer with recurrence in 2022 who has undergone multiple rounds of intravesical BCG with benign surveillance cystoscopy in March 2024 who presents today for evaluation of possible UTI.   Today he reports sudden onset tingling with urination about 10 days ago associated with low-grade temps, never > 100 F.  He took cranberry supplements and staying well-hydrated with symptom onset and they progressively resolved.  He also reports ongoing daytime frequency every 1-2.5 hours, urgency, and nocturia x 5, which has been ongoing for months.  He denies urinary leakage.  He has been using a urinal and bedside commode at night to help with this.  Overall he is minimally bothered by this and does not want to start any new medications.  In-office UA today positive for trace ketones and 2+ protein; urine microscopy with 6-10 WBCs/HPF. PVR 0mL.  PMH: Past Medical History:  Diagnosis Date   Anemia    Aortic atherosclerosis (HCC)    Atrial flutter, paroxysmal (HCC)    a.) postoperatively following CABG   Bladder cancer (HCC) 04/2020   BPH (benign prostatic hyperplasia)    CAD (coronary artery disease) 09/12/2019   a.) LHC 09/12/2019: EF 55-60%; 85% m-dLM, 50% pLCx, 85% p-m LAD, 70% pLAD, 45% mLAD, 70% p-mRCA. b.) 4v CABG 09/16/2019.   CKD (chronic kidney disease), stage III (HCC)    Diverticulosis    GERD (gastroesophageal reflux disease)    History of kidney stones    HTN (hypertension)    Hyperlipidemia    Long term current use of antithrombotics/antiplatelets    a.) DAPT therapies (ASA + clopidogrel)   NSTEMI (non-ST elevated myocardial infarction) (HCC) 09/11/2019   a.) LHC 09/12/2019: 85% m-dLM, 50% pLCx, 85% p-m LAD, 70% pLAD, 45% mLAD, 70%  p-mRCA; transferred to [facility] for CVTS consult. Underwent 4v CABG on 09/16/2019.   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   a.) 4v CABG: LIMA-LAD, SVG-RCA, SVG-OM1, SVG-D1   Small bowel obstruction (HCC)    after surgeries   Thoracic ascending aortic aneurysm (HCC)    a.) measured 4.1 cm by CT on 09/14/2019    Surgical History: Past Surgical History:  Procedure Laterality Date   aorto-bifem  2003   [facility]    CAROTID STENT     [facility]   CATARACT EXTRACTION, BILATERAL  2017   CHOLECYSTECTOMY  2002   COLONOSCOPY     COLONOSCOPY WITH PROPOFOL N/A 07/17/2021   Procedure: COLONOSCOPY WITH PROPOFOL;  Surgeon: Wyline Mood, MD;  Location: [facility] ENDOSCOPY;  Service: Gastroenterology;  Laterality: N/A;   CORONARY ARTERY BYPASS GRAFT N/A 09/16/2019   Procedure: CORONARY ARTERY BYPASS GRAFTING (CABG) using LIMA to LAD; Endoscopic right saphenous vein harvest to OM1, Diag1, and RCA.;  Surgeon: Linden Dolin, MD;  Location: [facility] OR;  Service: Open Heart Surgery;  Laterality: N/A;   CYSTOSCOPY WITH BIOPSY N/A 10/08/2021   Procedure: CYSTOSCOPY WITH BLADDER BIOPSY;  Surgeon: Riki Altes, MD;  Location: [facility] ORS;  Service: Urology;  Laterality: N/A;   ENDARTERECTOMY  FEMORAL Left 03/13/2021   Procedure: ENDARTERECTOMY FEMORAL (GROIN REVISION);  Surgeon: Renford Dills, MD;  Location: [facility] ORS;  Service: Vascular;  Laterality: Left;   LEFT HEART CATH AND CORONARY ANGIOGRAPHY N/A 09/12/2019   Procedure: LEFT HEART CATH AND CORONARY ANGIOGRAPHY;  Surgeon: Lamar Blinks, MD;  Location: [facility] INVASIVE CV LAB;  Service: Cardiovascular;  Laterality: N/A;   LITHOTRIPSY     LOWER EXTREMITY ANGIOGRAPHY Left 01/15/2021   Procedure: LOWER EXTREMITY ANGIOGRAPHY;  Surgeon: Renford Dills, MD;  Location: [facility] INVASIVE CV LAB;  Service: Cardiovascular;  Laterality: Left;   TEE WITHOUT CARDIOVERSION N/A 09/16/2019   Procedure: TRANSESOPHAGEAL ECHOCARDIOGRAM (TEE);  Surgeon: Linden Dolin, MD;  Location: [facility] OR;  Service: Open Heart Surgery;  Laterality: N/A;   TONSILLECTOMY     TRANSURETHRAL RESECTION OF BLADDER TUMOR  10/08/2021   Procedure: TRANSURETHRAL RESECTION OF BLADDER TUMOR (TURBT);  Surgeon: Riki Altes, MD;  Location: [facility] ORS;  Service: Urology;;   TRANSURETHRAL RESECTION OF BLADDER TUMOR WITH MITOMYCIN-C N/A 07/24/2020   Procedure: TRANSURETHRAL RESECTION OF BLADDER TUMOR WITH Gemcitabine;  Surgeon: Riki Altes, MD;  Location: [facility] ORS;  Service: Urology;  Laterality: N/A;   WISDOM TOOTH EXTRACTION      Home Medications:  Allergies as of 03/27/2023       Reactions   Cilostazol Other (See Comments)   Bowel upset   Simvastatin Other (See Comments)   myalgia        Medication List        Accurate as of March 27, 2023 11:34 AM. If you have any questions, ask your nurse or doctor.          STOP taking these medications    sulfamethoxazole-trimethoprim 800-160 MG tablet Commonly known as: BACTRIM DS Stopped by: Carman Ching, PA-C       TAKE these medications    aspirin EC 81 MG tablet Take 81 mg by mouth in the morning. Swallow whole.   calcium carbonate 500 MG chewable tablet Commonly known as: TUMS - dosed in mg elemental calcium Chew 1,000 mg by mouth daily as needed for indigestion or heartburn.   clopidogrel 75 MG tablet Commonly known as: PLAVIX *VIAL ONLY* TAKE ONE TABLET BY MOUTH EVERY DAY   hydrocortisone 2.5 % rectal cream Commonly known as: ANUSOL-HC Place 1 application rectally daily as needed for hemorrhoids or anal itching.   MAGNESIUM GLYCINATE PO Take 500 mg by mouth every evening.   metFORMIN 500 MG 24 hr tablet Commonly known as: GLUCOPHAGE-XR Take 1-2 tablets (500-1,000 mg total) by  mouth daily with breakfast.   metoprolol succinate 50 MG 24 hr tablet Commonly known as: TOPROL-XL TAKE ONE TABLET BY MOUTH ONCE DAILY (HIGH BLOOD PRESSURE)   metroNIDAZOLE 0.75 % gel Commonly known as: METROGEL APPLY 1 APPLICATION TOPICALLY TWICE DAILY   multivitamin with minerals Tabs tablet Take 1 tablet by mouth daily. Centrum Silver   Nexlizet 180-10 MG Tabs Generic drug: Bempedoic Acid-Ezetimibe Take 1 tablet by mouth daily.   pantoprazole 40 MG tablet Commonly known as: PROTONIX Take 40 mg by mouth daily as needed (indigestion/heartburn).   sodium chloride 0.65 % nasal spray Commonly known as: OCEAN Place 1 spray into the nose daily as needed (sinus congestion).   tamsulosin 0.4 MG Caps capsule Commonly known as: FLOMAX Take 1 capsule (0.4 mg total) by mouth daily.   TURMERIC CURCUMIN PO Take 2 tablets by mouth daily.   Vitamin D 50 MCG (2000 UT) Caps Take 2,000  Units by mouth in the morning.        Allergies:  Allergies  Allergen Reactions   Cilostazol Other (See Comments)    Bowel upset   Simvastatin Other (See Comments)    myalgia    Family History: Family History  Problem Relation Age of Onset   Diabetes Father        DM - and brother    Stroke Father    Coronary artery disease Paternal Grandfather    Colon cancer Neg Hx    Prostate cancer Neg Hx     Social History:   reports that he quit smoking about 30 years ago. His smoking use included cigarettes. He has a 20.00 pack-year smoking history. He has never been exposed to tobacco smoke. He has never used smokeless tobacco. He reports current alcohol use. He reports that he does not use drugs.  Physical Exam: BP 127/75   Pulse 85   Ht 5\' 10"  (1.778 m)   Wt 187 lb (84.8 kg)   BMI 26.83 kg/m   Constitutional:  Alert and oriented, no acute distress, nontoxic appearing HEENT: [place], AT Cardiovascular: No clubbing, cyanosis, or edema Respiratory: Normal respiratory effort, no increased work of  breathing Skin: No rashes, bruises or suspicious lesions Neurologic: Grossly intact, no focal deficits, moving all 4 extremities Psychiatric: Normal mood and affect  Laboratory Data: Results for orders placed or performed in visit on 03/27/23  Microscopic Examination   Urine  Result Value Ref Range   WBC, UA 6-10 (A) 0 - 5 /hpf   RBC, Urine 0-2 0 - 2 /hpf   Epithelial Cells (non renal) 0-10 0 - 10 /hpf   Renal Epithel, UA 0-10 (A) None seen /hpf   Casts Present (A) None seen /lpf   Cast Type Hyaline casts N/A   Mucus, UA Present (A) Not Estab.   Bacteria, UA Few None seen/Few  Urinalysis, Complete  Result Value Ref Range   Specific Gravity, UA >1.030 (H) 1.005 - 1.030   pH, UA 5.0 5.0 - 7.5   Color, UA Yellow Yellow   Appearance Ur Cloudy (A) Clear   Leukocytes,UA Negative Negative   Protein,UA 2+ (A) Negative/Trace   Glucose, UA Negative Negative   Ketones, UA Trace (A) Negative   RBC, UA Negative Negative   Bilirubin, UA Negative Negative   Urobilinogen, Ur 0.2 0.2 - 1.0 mg/dL   Nitrite, UA Negative Negative   Microscopic Examination See below:   BLADDER SCAN AMB NON-IMAGING  Result Value Ref Range   Scan Result 0ml    Assessment & Plan:   1. Dysuria Resolved with cranberry supplements and p.o. hydration.  UA today with mild pyuria, but otherwise rather bland.  Will send for culture and treat as indicated, however I do not think there is indication for antibiotics today.  He is in agreement with this plan. - Urinalysis, Complete - BLADDER SCAN AMB NON-IMAGING - CULTURE, URINE COMPREHENSIVE  2. Overactive bladder Months of ongoing daytime frequency, urgency, and nocturia.  Will keep him on Flomax given known BPH.  He declined [place] today because he does not want to take any additional medications.  If his symptoms worsen or he has increased bother, could consider Gemtesa versus third line OAB therapies in the future.  Return for Will call with results.  Carman Ching, PA-C  [facility] [address] [place], [place] [address] [phone]

## 2023-03-30 LAB — CULTURE, URINE COMPREHENSIVE

## 2023-03-31 LAB — CULTURE, URINE COMPREHENSIVE

## 2023-04-02 ENCOUNTER — Other Ambulatory Visit: Payer: Medicare PPO | Admitting: Urology

## 2023-04-06 ENCOUNTER — Other Ambulatory Visit: Payer: Self-pay | Admitting: Internal Medicine

## 2023-04-07 ENCOUNTER — Other Ambulatory Visit: Payer: Self-pay | Admitting: Internal Medicine

## 2023-04-08 ENCOUNTER — Ambulatory Visit (INDEPENDENT_AMBULATORY_CARE_PROVIDER_SITE_OTHER): Payer: Medicare PPO | Admitting: Urology

## 2023-04-08 VITALS — BP 126/74 | HR 87 | Ht 70.0 in | Wt 200.0 lb

## 2023-04-08 DIAGNOSIS — Z08 Encounter for follow-up examination after completed treatment for malignant neoplasm: Secondary | ICD-10-CM | POA: Diagnosis not present

## 2023-04-08 DIAGNOSIS — Z8551 Personal history of malignant neoplasm of bladder: Secondary | ICD-10-CM

## 2023-04-08 DIAGNOSIS — N3281 Overactive bladder: Secondary | ICD-10-CM | POA: Diagnosis not present

## 2023-04-08 LAB — URINALYSIS, COMPLETE
Bilirubin, UA: NEGATIVE
Glucose, UA: NEGATIVE
Ketones, UA: NEGATIVE
Leukocytes,UA: NEGATIVE
Nitrite, UA: NEGATIVE
Protein,UA: NEGATIVE
RBC, UA: NEGATIVE
Specific Gravity, UA: 1.015 (ref 1.005–1.030)
Urobilinogen, Ur: 0.2 mg/dL (ref 0.2–1.0)
pH, UA: 7 (ref 5.0–7.5)

## 2023-04-08 LAB — MICROSCOPIC EXAMINATION

## 2023-04-08 NOTE — Progress Notes (Signed)
   04/08/23  CC:  Chief Complaint  Patient presents with   Cysto    Urologic history: 1.  Ta urothelial carcinoma bladder, high-grade TURBT 07/24/2020; ~ 2 cm right posterior wall tumor Induction BCG completed 11/09/2020 which was tolerated well Maintenance BCG (received only 1 dose May 2022 and doses 2/22 May 2021 due to shortage TURBT 10/08/2021 for abnormal mucosa early papillary change just inside the left bladder neck; Path high-grade Ta Reinduction BCG x6 completed 01/10/2022 Maintenance BCG x3 completed: 04/15/2022, 11/08/2022   HPI: Brandon Lewis has no complaints today.  Denies gross hematuria.  UA negative  Blood pressure 126/73, pulse 85, height 5\' 10"  (1.778 m), weight 197 lb (89.4 kg).  Cystoscopy Procedure Note  Patient identification was confirmed, informed consent was obtained, and patient was prepped using Betadine solution.  Lidocaine jelly was administered per urethral meatus.     Pre-Procedure: - Inspection reveals a normal caliber urethral meatus.  Procedure: The flexible cystoscope was introduced without difficulty - No urethral strictures/lesions are present. -  Moderate lateral lobe enlargement  prostate  - Mild elevation bladder neck - Bilateral ureteral orifices identified - Bladder mucosa without solid or papillary lesions; minimal erythema left posterior wall - No bladder stones - Mild trabeculation  Retroflexion shows no abnormalities   Post-Procedure: - Patient tolerated the procedure well  Assessment/ Plan: No endoscopic evidence recurrent urothelial carcinoma Follow-up surveillance cystoscopy 3 months Maintenance BCG due July 2024    Riki Altes, MD

## 2023-04-28 ENCOUNTER — Ambulatory Visit: Payer: Medicare PPO | Admitting: Physician Assistant

## 2023-04-28 DIAGNOSIS — R350 Frequency of micturition: Secondary | ICD-10-CM | POA: Diagnosis not present

## 2023-04-28 DIAGNOSIS — R82998 Other abnormal findings in urine: Secondary | ICD-10-CM | POA: Diagnosis not present

## 2023-04-28 DIAGNOSIS — C679 Malignant neoplasm of bladder, unspecified: Secondary | ICD-10-CM | POA: Diagnosis not present

## 2023-04-28 DIAGNOSIS — R300 Dysuria: Secondary | ICD-10-CM | POA: Diagnosis not present

## 2023-04-28 DIAGNOSIS — N3281 Overactive bladder: Secondary | ICD-10-CM | POA: Diagnosis not present

## 2023-04-28 LAB — MICROSCOPIC EXAMINATION: WBC, UA: >30 /hpf — AB (ref 0–5)

## 2023-04-28 LAB — URINALYSIS, COMPLETE
Bilirubin, UA: NEGATIVE
Glucose, UA: NEGATIVE
Ketones, UA: NEGATIVE
Nitrite, UA: NEGATIVE
Specific Gravity, UA: 1.02 (ref 1.005–1.030)
Urobilinogen, Ur: 0.2 mg/dL (ref 0.2–1.0)
pH, UA: 5.5 (ref 5.0–7.5)

## 2023-04-28 MED ORDER — SULFAMETHOXAZOLE-TRIMETHOPRIM 800-160 MG PO TABS
1.00 | ORAL_TABLET | Freq: Two times a day (BID) | ORAL | 0 refills | Status: AC
Start: 2023-04-28 — End: 2023-05-05

## 2023-04-28 NOTE — Progress Notes (Signed)
Patient presented to the clinic today for a scheduled BCG instillation.  Urine grossly infected on UA, culture pending.  Patient reports dysuria, frequency.  Will treat for acute UTI today.  Prescription for Bactrim DS BID x7 days sent to pharmacy.  Patient to return to clinic next week for next scheduled BCG treatment.  We will add an additional treatment to his scheduled series to make up for today's missed instillation.  Carman Ching, PA-C 04/28/23 10:49 AM

## 2023-05-02 LAB — CULTURE, URINE COMPREHENSIVE

## 2023-05-04 ENCOUNTER — Telehealth: Payer: Self-pay

## 2023-05-04 ENCOUNTER — Other Ambulatory Visit: Payer: Self-pay

## 2023-05-04 MED ORDER — AMOXICILLIN-POT CLAVULANATE 875-125 MG PO TABS: 1.0000 | ORAL_TABLET | Freq: Two times a day (BID) | ORAL | 0 refills | Status: DC

## 2023-05-04 NOTE — Telephone Encounter (Signed)
I spoke with patient and let him know that I sent in Augmentin 875-125 mg bid x 7 days to [facility] instead of [facility], due to [place] not having it at his pharmacy. Patient wanted to know why it took almost a week for the urine culture to come back.  I explained to patient that urine was collected on 7/9 and that it was not get the results until a few days later, then Lelon Mast has to review over the urine collection report , then we give you a call.  Patient still was not happy but he is aware of the antibiotics and not to have his bcg for this week.

## 2023-05-04 NOTE — Telephone Encounter (Signed)
-----   Message from [facility] sent at 05/04/2023  9:40 AM EDT ----- His urine culture grew bacteria resistant to the antibiotics I prescribed. Please switch him to Augmentin 875-125mg  BID x7 days. We should defer BCG this week to allow time for these abx to work.

## 2023-05-05 ENCOUNTER — Ambulatory Visit: Payer: Medicare PPO | Admitting: Physician Assistant

## 2023-05-12 ENCOUNTER — Ambulatory Visit: Payer: Medicare PPO | Admitting: Physician Assistant

## 2023-05-12 DIAGNOSIS — C679 Malignant neoplasm of bladder, unspecified: Secondary | ICD-10-CM | POA: Diagnosis not present

## 2023-05-12 DIAGNOSIS — D494 Neoplasm of unspecified behavior of bladder: Secondary | ICD-10-CM | POA: Diagnosis not present

## 2023-05-12 LAB — MICROSCOPIC EXAMINATION

## 2023-05-12 LAB — URINALYSIS, COMPLETE
Bilirubin, UA: NEGATIVE
Glucose, UA: NEGATIVE
Ketones, UA: NEGATIVE
Leukocytes,UA: NEGATIVE
Nitrite, UA: NEGATIVE
Protein,UA: NEGATIVE
RBC, UA: NEGATIVE
Specific Gravity, UA: 1.01 (ref 1.005–1.030)
Urobilinogen, Ur: 0.2 mg/dL (ref 0.2–1.0)
pH, UA: 5.5 (ref 5.0–7.5)

## 2023-05-12 MED ORDER — BCG LIVE 50 MG IS SUSR
3.2400 mL | Freq: Once | INTRAVESICAL | Status: AC
Start: 2023-05-12 — End: 2023-05-12
  Administered 2023-05-12: 81 mg via INTRAVESICAL

## 2023-05-12 NOTE — Progress Notes (Signed)
BCG Bladder Instillation  BCG # 1 of 3  Due to Bladder Cancer patient is present today for a BCG treatment. Patient was cleaned and prepped in a sterile fashion with betadine. A 14FR catheter was inserted, urine return was noted 3ml, urine was yellow in color.  50ml of reconstituted BCG was instilled into the bladder. The catheter was then removed. Patient tolerated well, no complications were noted  Performed by: Carman Ching, PA-C and Ples Specter, CMA  Follow up/ Additional notes: 1 week for BCG #2

## 2023-05-19 ENCOUNTER — Ambulatory Visit: Payer: Medicare PPO | Admitting: Physician Assistant

## 2023-05-19 VITALS — BP 116/72 | HR 97 | Ht 70.0 in | Wt 200.0 lb

## 2023-05-19 DIAGNOSIS — D494 Neoplasm of unspecified behavior of bladder: Secondary | ICD-10-CM | POA: Diagnosis not present

## 2023-05-19 DIAGNOSIS — C679 Malignant neoplasm of bladder, unspecified: Secondary | ICD-10-CM | POA: Diagnosis not present

## 2023-05-19 LAB — URINALYSIS, COMPLETE
Bilirubin, UA: NEGATIVE
Glucose, UA: NEGATIVE
Ketones, UA: NEGATIVE
Leukocytes,UA: NEGATIVE
Nitrite, UA: NEGATIVE
Protein,UA: NEGATIVE
RBC, UA: NEGATIVE
Specific Gravity, UA: 1.015 (ref 1.005–1.030)
Urobilinogen, Ur: 0.2 mg/dL (ref 0.2–1.0)
pH, UA: 6 (ref 5.0–7.5)

## 2023-05-19 LAB — MICROSCOPIC EXAMINATION
Bacteria, UA: NONE SEEN
RBC, Urine: NONE SEEN /HPF (ref 0–2)

## 2023-05-19 MED ORDER — BCG LIVE 50 MG IS SUSR
3.2400 mL | Freq: Once | INTRAVESICAL | Status: AC
Start: 2023-05-19 — End: 2023-05-19
  Administered 2023-05-19: 81 mg via INTRAVESICAL

## 2023-05-19 NOTE — Patient Instructions (Signed)
Your Timeline for Today:  Right now through 11:35am: Hold your urine and do your quarter turns every 15 minutes. 11:35am-5:35pm today: Every time you urinate, pour 1/2 cup of bleach into the toilet and let it sit for 15 minutes prior to flushing. 5:35pm onward: Resume your normal routine.   Patient Education: (BCG) Into the Bladder (Intravesical Chemotherapy)  BCG is a vaccine which is used to prevent tuberculosis (TB).  But it's also a helpful treatment for some early bladder cancers.  When BCG goes directly into the bladder the treatment is described as intravesical.  BCG is a type of immunotherapy.  Immunotherapy stimulates the body's immune system to destroy cancer cells.  How it's given BCG treatment is given to you in an outpatient setting.  It takes a few minutes to administer and you can go home as soon as it's finished.  It might be a good idea to ask someone to bring you, particularly the fist time.  Unlike chemotherapy into the bladder, BCG treatment is never given immediately after surgery to remove bladder tumors.  There needs to be a delay usually of at least two weeks after surgery, before you can have it.  You won't be given treatment with BCG if you are unwell or have an infection in your urine.  You're usually asked to limit the amount you drink before your treatment.  This will help to increase the concentration of BCG in your bladder.  Drinking too much before your treatment may make your bladder feel uncomfortably full.  If you normally take water tablets (diuretics) take them later in the day after your treatment.  Your nurse or doctor will give you more advise about preparing for your treatment.  You will have a small tube (catheter) placed into your bladder.  Your doctor will then put the liquid vaccine directly into your bladder through the catheter and remove the catheter.  You will need to hold your urine for two hours afterwards.  Rotating every 15 minutes from side to  side. This can be difficult but it's to give the treatment time to work.  When the treatment is over you can go to the toilet.  After your treatment there are some precautions you'll need to take.  This is because BCG is a live vaccine and other people shouldn't be exposed to it.  For the next six hours, you'll need to avoid your urine splashing on the toilet seat and getting any urine on your hands.  It might be easer for men to sit down when they're using an ordinary toilet although using a stand up urinal should be alright.  The main this is to avoid splashing urine and spreading the vaccine.  You will also be asked to put 1/2 cup undiluted bleach into the toilet to destroy any live vaccine and leave it for 15 minutes until you flush for the next 6 voids.  Side Effects Because BCG goes directly into the bladder most of the side effects are linked with the bladder.  They usually go away within one to two days after your treatment.  The most common ones are: -needing to pass urine often -pain when you pass urine -blood in urine -flu-like symptoms (tiredness, general aching and raised temperature)  Theses side effects should settle down within a day or two.  If they don't get better contact your doctor.  Drinking lots of fluids can help flush the drug out of your bladder and reduce some of these effects.  Taking Ibuprofen or Aleve is encouraged unless you have a condition that would make these medications unsafe to take (renal failure, diabetes, gerd)  Rare side effects can include a continuing high temperature (fever), pain in your joints and a cough.  If you have any of these symptoms, or if you feel generally unwell, contact your doctor.  These symptoms could be a sign of a more serious infection (due to BCG) that needs to be treated immediately.  If this happens you'll be treated with the same drugs (antibiotics) that are used to treat TB.  Contraception Men should use a condom during sex for  the first 48 hours after their treatment.  If you are a women who has had BCG treatment then your partner should use a condom.  Using a condom will protect your partner from any vaccine present in your semen or vaginal fluid.  We don't know how BCG may affect a developing fetus so it's not advisable to become pregnant or father a child while having it.  It is important to use effective contraception during your treatment and for six weeks afterwards.  You can discuss this with your doctor or specialist nurse.

## 2023-05-19 NOTE — Progress Notes (Signed)
BCG Bladder Instillation  BCG # 2 of 3  Due to Bladder Cancer patient is present today for a BCG treatment. Patient was cleaned and prepped in a sterile fashion with betadine. A 14FR catheter was inserted, urine return was noted 10ml, urine was yellow in color.  50ml of reconstituted BCG was instilled into the bladder. The catheter was then removed. Patient tolerated well, no complications were noted  Performed by: Carman Ching, PA-C and Humberta Magallon-Mariche, CMA  Follow up/ Additional notes: 1 week for BCG #3 of 3

## 2023-05-26 ENCOUNTER — Ambulatory Visit: Payer: Medicare PPO | Admitting: Physician Assistant

## 2023-05-26 DIAGNOSIS — D494 Neoplasm of unspecified behavior of bladder: Secondary | ICD-10-CM | POA: Diagnosis not present

## 2023-05-26 DIAGNOSIS — C679 Malignant neoplasm of bladder, unspecified: Secondary | ICD-10-CM

## 2023-05-26 LAB — URINALYSIS, COMPLETE
Bilirubin, UA: NEGATIVE
Glucose, UA: NEGATIVE
Ketones, UA: NEGATIVE
Leukocytes,UA: NEGATIVE
Nitrite, UA: NEGATIVE
Protein,UA: NEGATIVE
RBC, UA: NEGATIVE
Specific Gravity, UA: 1.015 (ref 1.005–1.030)
Urobilinogen, Ur: 0.2 mg/dL (ref 0.2–1.0)
pH, UA: 5.5 (ref 5.0–7.5)

## 2023-05-26 LAB — MICROSCOPIC EXAMINATION: Bacteria, UA: NONE SEEN

## 2023-05-26 MED ORDER — BCG LIVE 50 MG IS SUSR
3.2400 mL | Freq: Once | INTRAVESICAL | Status: AC
Start: 2023-05-26 — End: 2023-05-26
  Administered 2023-05-26: 81 mg via INTRAVESICAL

## 2023-05-26 NOTE — Progress Notes (Signed)
BCG Bladder Instillation  BCG # 3 of 3  Due to Bladder Cancer patient is present today for a BCG treatment. Patient was cleaned and prepped in a sterile fashion with betadine. A 14FR catheter was inserted, urine return was noted 25ml, urine was yellow in color.  50ml of reconstituted BCG was instilled into the bladder. The catheter was then removed. Patient tolerated well, no complications were noted  Performed by: Carman Ching, PA-C and Ples Specter, CMA  Follow up/ Additional notes: Return in about 6 weeks (around 07/07/2023) for Cysto with Dr. Lonna Cobb.

## 2023-06-04 ENCOUNTER — Encounter: Payer: Medicare PPO | Admitting: Internal Medicine

## 2023-06-08 ENCOUNTER — Other Ambulatory Visit: Payer: Self-pay | Admitting: Internal Medicine

## 2023-06-15 DIAGNOSIS — E785 Hyperlipidemia, unspecified: Secondary | ICD-10-CM | POA: Diagnosis not present

## 2023-07-08 ENCOUNTER — Other Ambulatory Visit: Payer: Self-pay | Admitting: Internal Medicine

## 2023-07-10 ENCOUNTER — Ambulatory Visit: Payer: Medicare PPO | Admitting: Urology

## 2023-07-10 ENCOUNTER — Encounter: Payer: Self-pay | Admitting: Urology

## 2023-07-10 VITALS — BP 105/65 | HR 88 | Ht 70.0 in | Wt 200.0 lb

## 2023-07-10 DIAGNOSIS — Z8551 Personal history of malignant neoplasm of bladder: Secondary | ICD-10-CM

## 2023-07-10 DIAGNOSIS — C679 Malignant neoplasm of bladder, unspecified: Secondary | ICD-10-CM | POA: Diagnosis not present

## 2023-07-10 DIAGNOSIS — Z08 Encounter for follow-up examination after completed treatment for malignant neoplasm: Secondary | ICD-10-CM | POA: Diagnosis not present

## 2023-07-10 DIAGNOSIS — D494 Neoplasm of unspecified behavior of bladder: Secondary | ICD-10-CM | POA: Diagnosis not present

## 2023-07-10 LAB — URINALYSIS, COMPLETE
Bilirubin, UA: NEGATIVE
Glucose, UA: NEGATIVE
Ketones, UA: NEGATIVE
Leukocytes,UA: NEGATIVE
Nitrite, UA: NEGATIVE
RBC, UA: NEGATIVE
Specific Gravity, UA: 1.025 (ref 1.005–1.030)
Urobilinogen, Ur: 0.2 mg/dL (ref 0.2–1.0)
pH, UA: 5.5 (ref 5.0–7.5)

## 2023-07-10 LAB — MICROSCOPIC EXAMINATION

## 2023-07-10 NOTE — Progress Notes (Signed)
   07/10/23  CC:  Chief Complaint  Patient presents with   Cysto    Urologic history: 1.  Ta urothelial carcinoma bladder, high-grade TURBT 07/24/2020; ~ 2 cm right posterior wall tumor Induction BCG completed 11/09/2020 which was tolerated well Maintenance BCG (received only 1 dose May 2022 and doses 2/22 May 2021 due to shortage TURBT 10/08/2021 for abnormal mucosa early papillary change just inside the left bladder neck; Path high-grade Ta Reinduction BCG x6 completed 01/10/2022 Maintenance BCG x3 completed: 04/15/2022, 11/08/2022, 05/26/2023   HPI: Brandon Lewis has no complaints today.  Denies gross hematuria.  UA negative  Blood pressure 126/73, pulse 85, height 5\' 10"  (1.778 m), weight 197 lb (89.4 kg).  Cystoscopy Procedure Note  Patient identification was confirmed, informed consent was obtained, and patient was prepped using Betadine solution.  Lidocaine jelly was administered per urethral meatus.     Pre-Procedure: - Inspection reveals a normal caliber urethral meatus.  Procedure: The flexible cystoscope was introduced without difficulty - No urethral strictures/lesions are present. -  Moderate lateral lobe enlargement  prostate  - Mild elevation bladder neck - Bilateral ureteral orifices identified - Bladder mucosa without erythema, solid or papillary lesions - No bladder stones - Mild trabeculation  Retroflexion shows no abnormalities/tumor   Post-Procedure: - Patient tolerated the procedure well  Assessment/ Plan: No endoscopic evidence recurrent urothelial carcinoma Follow-up surveillance cystoscopy 3 months Maintenance BCG due February 2025    Riki Altes, MD

## 2023-07-21 ENCOUNTER — Ambulatory Visit: Payer: Medicare PPO | Admitting: Podiatry

## 2023-07-21 DIAGNOSIS — E119 Type 2 diabetes mellitus without complications: Secondary | ICD-10-CM | POA: Diagnosis not present

## 2023-07-21 DIAGNOSIS — B351 Tinea unguium: Secondary | ICD-10-CM | POA: Diagnosis not present

## 2023-07-21 DIAGNOSIS — M79674 Pain in right toe(s): Secondary | ICD-10-CM | POA: Diagnosis not present

## 2023-07-21 DIAGNOSIS — M79675 Pain in left toe(s): Secondary | ICD-10-CM

## 2023-07-21 NOTE — Progress Notes (Signed)
   No chief complaint on file.   SUBJECTIVE Patient with a history of diabetes mellitus presents to office today complaining of elongated, thickened nails that cause pain while ambulating in shoes.  Patient is unable to trim their own nails. Patient is here for further evaluation and treatment.  Past Medical History:  Diagnosis Date   Anemia    Aortic atherosclerosis (HCC)    Atrial flutter, paroxysmal (HCC)    a.) postoperatively following CABG   Bladder cancer (HCC) 04/2020   BPH (benign prostatic hyperplasia)    CAD (coronary artery disease) 09/12/2019   a.) LHC 09/12/2019: EF 55-60%; 85% m-dLM, 50% pLCx, 85% p-m LAD, 70% pLAD, 45% mLAD, 70% p-mRCA. b.) 4v CABG 09/16/2019.   CKD (chronic kidney disease), stage III (HCC)    Diverticulosis    GERD (gastroesophageal reflux disease)    History of kidney stones    HTN (hypertension)    Hyperlipidemia    Long term current use of antithrombotics/antiplatelets    a.) DAPT therapies (ASA + clopidogrel)   NSTEMI (non-ST elevated myocardial infarction) (HCC) 09/11/2019   a.) LHC 09/12/2019: 85% m-dLM, 50% pLCx, 85% p-m LAD, 70% pLAD, 45% mLAD, 70% p-mRCA; transferred to [facility] for CVTS consult. Underwent 4v CABG on 09/16/2019.   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   a.) 4v CABG: LIMA-LAD, SVG-RCA, SVG-OM1, SVG-D1   Small bowel obstruction (HCC)    after surgeries   Thoracic ascending aortic aneurysm (HCC)    a.) measured 4.1 cm by CT on 09/14/2019    Allergies  Allergen Reactions   Cilostazol Other (See Comments)    Bowel upset   Simvastatin Other (See Comments)    myalgia     OBJECTIVE General Patient is awake, alert, and oriented x 3 and in no acute distress. Derm Skin is dry and supple  bilateral. Negative open lesions or macerations. Remaining integument unremarkable. Nails are tender, long, thickened and dystrophic with subungual debris, consistent with onychomycosis, 1-5 bilateral. No signs of infection noted. Vasc  DP and PT pedal pulses palpable bilaterally. Temperature gradient within normal limits.  Neuro Epicritic and protective threshold sensation diminished bilaterally.  Musculoskeletal Exam No symptomatic pedal deformities noted bilateral. Muscular strength within normal limits.  ASSESSMENT 1. Diabetes Mellitus w/ peripheral neuropathy 2.  Pain due to onychomycosis of toenails bilateral  PLAN OF CARE 1. Patient evaluated today.  Comprehensive diabetic foot exam performed today  2. Instructed to maintain good pedal hygiene and foot care. Stressed importance of controlling blood sugar.  3. Mechanical debridement of nails 1-5 bilaterally performed using a nail nipper. Filed with dremel without incident.  4. Return to clinic in 3 mos.     Felecia Shelling, DPM [facility]  Dr. Felecia Shelling, DPM    [address] [place], [place] [address]                Office [phone]  Fax [phone]

## 2023-08-27 DIAGNOSIS — H903 Sensorineural hearing loss, bilateral: Secondary | ICD-10-CM | POA: Diagnosis not present

## 2023-09-03 ENCOUNTER — Encounter (INDEPENDENT_AMBULATORY_CARE_PROVIDER_SITE_OTHER): Payer: Self-pay | Admitting: Vascular Surgery

## 2023-09-03 ENCOUNTER — Ambulatory Visit (INDEPENDENT_AMBULATORY_CARE_PROVIDER_SITE_OTHER): Payer: Medicare PPO | Admitting: Vascular Surgery

## 2023-09-03 ENCOUNTER — Ambulatory Visit (INDEPENDENT_AMBULATORY_CARE_PROVIDER_SITE_OTHER): Payer: Medicare PPO

## 2023-09-03 VITALS — BP 137/78 | HR 82 | Resp 18 | Ht 70.0 in | Wt 191.2 lb

## 2023-09-03 DIAGNOSIS — I2510 Atherosclerotic heart disease of native coronary artery without angina pectoris: Secondary | ICD-10-CM | POA: Diagnosis not present

## 2023-09-03 DIAGNOSIS — E1151 Type 2 diabetes mellitus with diabetic peripheral angiopathy without gangrene: Secondary | ICD-10-CM | POA: Diagnosis not present

## 2023-09-03 DIAGNOSIS — I70219 Atherosclerosis of native arteries of extremities with intermittent claudication, unspecified extremity: Secondary | ICD-10-CM

## 2023-09-03 DIAGNOSIS — I10 Essential (primary) hypertension: Secondary | ICD-10-CM | POA: Diagnosis not present

## 2023-09-03 LAB — VAS US ABI WITH/WO TBI
Left ABI: 0.99
Right ABI: 0.99

## 2023-09-03 NOTE — Progress Notes (Signed)
MRN : [id number]  Brandon Lewis is a 89 y.o. (10-12-1934) male who presents with chief complaint of check circulation.  History of Present Illness:   The patient returns to the office for followup and review of the noninvasive studies.  He has a history of remote aortobifemoral bypass grafting.   He is s/p redo left common femoral, superficial femoral and profunda femoris endarterectomy with Cormatrix patch angioplasty on 03/13/2021.   There have been no interval changes in lower extremity symptoms.  In fact he feels that he is walking as best as he ever has.  No interval shortening of the patient's claudication distance (he states that remains about the same) no development of rest pain symptoms. No new ulcers or wounds have occurred since the last visit.   There have been no significant changes to the patient's overall health care.   The patient denies amaurosis fugax or recent TIA symptoms. There are no documented recent neurological changes noted. There is no history of DVT, PE or superficial thrombophlebitis. The patient denies recent episodes of angina or shortness of breath.    ABI Rt=0.91 and Lt=0.91  (previous ABI's Rt=0.51 and Lt=0.58).   Duplex ultrasound of the abdominal aorta and bilateral iliac arteries shows a widely patent aortobifemoral bypass graft no evidence of hemodynamically significant restenosis.  No outpatient medications have been marked as taking for the 09/03/23 encounter (Appointment) with Gilda Crease, Latina Craver, MD.    Past Medical History:  Diagnosis Date   Anemia    Aortic atherosclerosis (HCC)    Atrial flutter, paroxysmal (HCC)    a.) postoperatively following CABG   Bladder cancer (HCC) 04/2020   BPH (benign prostatic hyperplasia)    CAD (coronary artery disease) 09/12/2019   a.) LHC 09/12/2019: EF 55-60%; 85% m-dLM, 50% pLCx, 85% p-m LAD, 70% pLAD, 45% mLAD, 70% p-mRCA. b.)  4v CABG 09/16/2019.   CKD (chronic kidney disease), stage III (HCC)    Diverticulosis    GERD (gastroesophageal reflux disease)    History of kidney stones    HTN (hypertension)    Hyperlipidemia    Long term current use of antithrombotics/antiplatelets    a.) DAPT therapies (ASA + clopidogrel)   NSTEMI (non-ST elevated myocardial infarction) (HCC) 09/11/2019   a.) LHC 09/12/2019: 85% m-dLM, 50% pLCx, 85% p-m LAD, 70% pLAD, 45% mLAD, 70% p-mRCA; transferred to [facility] for CVTS consult. Underwent 4v CABG on 09/16/2019.   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   a.) 4v CABG: LIMA-LAD, SVG-RCA, SVG-OM1, SVG-D1   Small bowel obstruction (HCC)    after surgeries   Thoracic ascending aortic aneurysm (HCC)    a.) measured 4.1 cm by CT on 09/14/2019    Past Surgical History:  Procedure Laterality Date   aorto-bifem  2003   [facility]    CAROTID STENT     [facility]   CATARACT  EXTRACTION, BILATERAL  2017   CHOLECYSTECTOMY  2002   COLONOSCOPY     COLONOSCOPY WITH PROPOFOL N/A 07/17/2021   Procedure: COLONOSCOPY WITH PROPOFOL;  Surgeon: Wyline Mood, MD;  Location: [facility] ENDOSCOPY;  Service: Gastroenterology;  Laterality: N/A;   CORONARY ARTERY BYPASS GRAFT N/A 09/16/2019   Procedure: CORONARY ARTERY BYPASS GRAFTING (CABG) using LIMA to LAD; Endoscopic right saphenous vein harvest to OM1, Diag1, and RCA.;  Surgeon: Linden Dolin, MD;  Location: [facility] OR;  Service: Open Heart Surgery;  Laterality: N/A;   CYSTOSCOPY WITH BIOPSY N/A 10/08/2021   Procedure: CYSTOSCOPY WITH BLADDER BIOPSY;  Surgeon: Riki Altes, MD;  Location: [facility] ORS;  Service: Urology;  Laterality: N/A;   ENDARTERECTOMY FEMORAL Left 03/13/2021   Procedure: ENDARTERECTOMY FEMORAL (GROIN REVISION);  Surgeon:  Renford Dills, MD;  Location: [facility] ORS;  Service: Vascular;  Laterality: Left;   LEFT HEART CATH AND CORONARY ANGIOGRAPHY N/A 09/12/2019   Procedure: LEFT HEART CATH AND CORONARY ANGIOGRAPHY;  Surgeon: Lamar Blinks, MD;  Location: [facility] INVASIVE CV LAB;  Service: Cardiovascular;  Laterality: N/A;   LITHOTRIPSY     LOWER EXTREMITY ANGIOGRAPHY Left 01/15/2021   Procedure: LOWER EXTREMITY ANGIOGRAPHY;  Surgeon: Renford Dills, MD;  Location: [facility] INVASIVE CV LAB;  Service: Cardiovascular;  Laterality: Left;   TEE WITHOUT CARDIOVERSION N/A 09/16/2019   Procedure: TRANSESOPHAGEAL ECHOCARDIOGRAM (TEE);  Surgeon: Linden Dolin, MD;  Location[facility] OR;  Service: Open Heart Surgery;  Laterality: N/A;   TONSILLECTOMY     TRANSURETHRAL RESECTION OF BLADDER TUMOR  10/08/2021   Procedure: TRANSURETHRAL RESECTION OF BLADDER TUMOR (TURBT);  Surgeon: Riki Altes, MD;  Location: [facility] ORS;  Service: Urology;;   TRANSURETHRAL RESECTION OF BLADDER TUMOR WITH MITOMYCIN-C N/A 07/24/2020   Procedure: TRANSURETHRAL RESECTION OF BLADDER TUMOR WITH Gemcitabine;  Surgeon: Riki Altes, MD;  Location: [facility] ORS;  Service: Urology;  Laterality: N/A;   WISDOM TOOTH EXTRACTION      Social History Social History   Tobacco Use   Smoking status: Former    Current packs/day: 0.00    Average packs/day: 1 pack/day for 20.0 years (20.0 ttl pk-yrs)    Types: Cigarettes    Start date: 10/20/1972    Quit date: 10/20/1992    Years since quitting: 30.8    Passive exposure: Never   Smokeless tobacco: Never   Tobacco comments:    quit in 1994   Vaping Use   Vaping status: Never Used  Substance Use Topics   Alcohol use: Yes    Comment: rare   Drug use: No    Family History Family History  Problem Relation Age of Onset   Diabetes Father        DM - and brother    Stroke Father    Coronary artery disease Paternal Grandfather    Colon cancer Neg Hx    Prostate cancer Neg Hx     Allergies  Allergen  Reactions   Cilostazol Other (See Comments)    Bowel upset   Simvastatin Other (See Comments)    myalgia     REVIEW OF SYSTEMS (Negative unless checked)  Constitutional: [] Weight loss  [] Fever  [] Chills Cardiac: [] Chest pain   [] Chest pressure   [] Palpitations   [] Shortness of breath when laying flat   [] Shortness of breath with exertion. Vascular:  [x] Pain in legs with walking   [] Pain in legs at rest  [] History of DVT   [] Phlebitis   [] Swelling in legs   [] Varicose  veins   [] Non-healing ulcers Pulmonary:   [] Uses home oxygen   [] Productive cough   [] Hemoptysis   [] Wheeze  [] COPD   [] Asthma Neurologic:  [] Dizziness   [] Seizures   [] History of stroke   [] History of TIA  [] Aphasia   [] Vissual changes   [] Weakness or numbness in arm   [] Weakness or numbness in leg Musculoskeletal:   [] Joint swelling   [] Joint pain   [] Low back pain Hematologic:  [] Easy bruising  [] Easy bleeding   [] Hypercoagulable state   [] Anemic Gastrointestinal:  [] Diarrhea   [] Vomiting  [] Gastroesophageal reflux/heartburn   [] Difficulty swallowing. Genitourinary:  [] Chronic kidney disease   [] Difficult urination  [] Frequent urination   [] Blood in urine Skin:  [] Rashes   [] Ulcers  Psychological:  [] History of anxiety   []  History of major depression.  Physical Examination  There were no vitals filed for this visit. There is no height or weight on file to calculate BMI. Gen: WD/WN, NAD Head: [place]/AT, No temporalis wasting.  Ear/Nose/Throat: Hearing grossly intact, nares w/o erythema or drainage Eyes: PER, EOMI, sclera nonicteric.  Neck: Supple, no masses.  No bruit or JVD.  Pulmonary:  Good air movement, no audible wheezing, no use of accessory muscles.  Cardiac: RRR, normal S1, S2, no Murmurs. Vascular:  mild trophic changes, no open wounds Vessel Right Left  Radial Palpable Palpable  PT Not Palpable Not Palpable  DP Not Palpable Not Palpable  Gastrointestinal: soft, non-distended. No guarding/no peritoneal  signs.  Musculoskeletal: M/S 5/5 throughout.  No visible deformity.  Neurologic: CN 2-12 intact. Pain and light touch intact in extremities.  Symmetrical.  Speech is fluent. Motor exam as listed above. Psychiatric: Judgment intact, Mood & affect appropriate for pt's clinical situation. Dermatologic: No rashes or ulcers noted.  No changes consistent with cellulitis.   CBC Lab Results  Component Value Date   WBC 9.1 05/29/2022   HGB 11.1 (L) 05/29/2022   HCT 34.5 (L) 05/29/2022   MCV 119.8 Repeated and verified X2. (H) 05/29/2022   PLT 189.0 05/29/2022    BMET    Component Value Date/Time   NA 140 05/29/2022 1506   K 4.7 05/29/2022 1506   CL 106 05/29/2022 1506   CO2 26 05/29/2022 1506   GLUCOSE 140 (H) 05/29/2022 1506   BUN 24 (H) 05/29/2022 1506   CREATININE 1.49 05/29/2022 1506   CALCIUM 9.4 05/29/2022 1506   GFRNONAA 46 (L) 10/03/2021 1216   GFRAA 45 (L) 10/01/2019 0327   CrCl cannot be calculated (Patient's most recent lab result is older than the maximum 21 days allowed.).  COAG Lab Results  Component Value Date   INR 1.1 03/05/2021   INR 1.3 (H) 09/16/2019   INR 1.1 09/14/2019    Radiology No results found.   Assessment/Plan 1. 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  The patient does not voice lifestyle limiting changes at this point in time.   Noninvasive studies do not suggest clinically significant change.  The revision of the distal anastomosis of the aortobifem has been a stable reconstruction.   No invasive studies, angiography or surgery at this time The patient should continue walking and begin a more formal exercise program.  The patient should continue antiplatelet therapy and aggressive treatment of the lipid abnormalities   No changes in the patient's medications at this  time   Continued surveillance is indicated  as atherosclerosis is likely to progress with time.     The patient will continue follow up with noninvasive studies as ordered.  - VAS [place] ABI WITH/WO TBI; Future  2. Coronary artery disease involving native coronary artery of native heart without angina pectoris Continue cardiac and antihypertensive medications as already ordered and reviewed, no changes at this time.  Continue statin as ordered and reviewed, no changes at this time  Nitrates PRN for chest pain  3. Controlled type 2 diabetes mellitus with diabetic peripheral angiopathy without gangrene, without long-term current use of insulin (HCC) Continue hypoglycemic medications as already ordered, these medications have been reviewed and there are no changes at this time.  Hgb A1C to be monitored as already arranged by primary service  4. Essential hypertension, benign Continue antihypertensive medications as already ordered, these medications have been reviewed and there are no changes at this time.    Levora Dredge, MD  09/03/2023 12:37 PM

## 2023-09-11 ENCOUNTER — Ambulatory Visit: Payer: Medicare PPO | Admitting: Internal Medicine

## 2023-09-11 ENCOUNTER — Encounter: Payer: Self-pay | Admitting: Internal Medicine

## 2023-09-11 VITALS — BP 130/74 | HR 80 | Temp 97.7°F | Ht 69.5 in | Wt 190.0 lb

## 2023-09-11 DIAGNOSIS — I70218 Atherosclerosis of native arteries of extremities with intermittent claudication, other extremity: Secondary | ICD-10-CM

## 2023-09-11 DIAGNOSIS — E1159 Type 2 diabetes mellitus with other circulatory complications: Secondary | ICD-10-CM | POA: Diagnosis not present

## 2023-09-11 DIAGNOSIS — N1831 Chronic kidney disease, stage 3a: Secondary | ICD-10-CM | POA: Diagnosis not present

## 2023-09-11 DIAGNOSIS — I4892 Unspecified atrial flutter: Secondary | ICD-10-CM | POA: Diagnosis not present

## 2023-09-11 DIAGNOSIS — N401 Enlarged prostate with lower urinary tract symptoms: Secondary | ICD-10-CM | POA: Diagnosis not present

## 2023-09-11 DIAGNOSIS — Z0000 Encounter for general adult medical examination without abnormal findings: Secondary | ICD-10-CM

## 2023-09-11 DIAGNOSIS — Z7984 Long term (current) use of oral hypoglycemic drugs: Secondary | ICD-10-CM | POA: Diagnosis not present

## 2023-09-11 LAB — RENAL FUNCTION PANEL
Albumin: 4.1 g/dL (ref 3.5–5.2)
BUN: 44 mg/dL — ABNORMAL HIGH (ref 6–23)
CO2: 27 meq/L (ref 19–32)
Calcium: 9.9 mg/dL (ref 8.4–10.5)
Chloride: 105 meq/L (ref 96–112)
Creatinine, Ser: 1.74 mg/dL — ABNORMAL HIGH (ref 0.40–1.50)
GFR: 34.83 mL/min — ABNORMAL LOW (ref >60.00)
Glucose, Bld: 126 mg/dL — ABNORMAL HIGH (ref 70–99)
Phosphorus: 2.9 mg/dL (ref 2.3–4.6)
Potassium: 4.6 meq/L (ref 3.5–5.1)
Sodium: 139 meq/L (ref 135–145)

## 2023-09-11 LAB — CBC
HCT: 33.9 % — ABNORMAL LOW (ref 39.0–52.0)
Hemoglobin: 11 g/dL — ABNORMAL LOW (ref 13.0–17.0)
MCHC: 32.4 g/dL (ref 30.0–36.0)
MCV: 122.5 fL — ABNORMAL HIGH (ref 78.0–100.0)
Platelets: 197 10*3/uL (ref 150.0–400.0)
RBC: 2.77 Mil/uL — ABNORMAL LOW (ref 4.22–5.81)
RDW: 16.3 % — ABNORMAL HIGH (ref 11.5–15.5)
WBC: 8.9 10*3/uL (ref 4.0–10.5)

## 2023-09-11 LAB — HEPATIC FUNCTION PANEL
ALT: 15 U/L (ref 0–53)
AST: 26 U/L (ref 0–37)
Albumin: 4.1 g/dL (ref 3.5–5.2)
Alkaline Phosphatase: 58 U/L (ref 39–117)
Bilirubin, Direct: 0.1 mg/dL (ref 0.0–0.3)
Total Bilirubin: 0.7 mg/dL (ref 0.2–1.2)
Total Protein: 7.5 g/dL (ref 6.0–8.3)

## 2023-09-11 LAB — LIPID PANEL
Cholesterol: 145 mg/dL (ref 0–200)
HDL: 30.3 mg/dL — ABNORMAL LOW (ref >39.00)
LDL Cholesterol: 85 mg/dL (ref 0–99)
NonHDL: 114.32
Total CHOL/HDL Ratio: 5
Triglycerides: 146 mg/dL (ref 0.0–149.0)
VLDL: 29.2 mg/dL (ref 0.0–40.0)

## 2023-09-11 LAB — VITAMIN D 25 HYDROXY (VIT D DEFICIENCY, FRACTURES): VITD: 67.32 ng/mL (ref 30.00–100.00)

## 2023-09-11 LAB — TSH: TSH: 2.55 u[IU]/mL (ref 0.35–5.50)

## 2023-09-11 LAB — HM DIABETES FOOT EXAM

## 2023-09-11 LAB — HEMOGLOBIN A1C: Hgb A1c MFr Bld: 6.4 % (ref 4.6–6.5)

## 2023-09-11 MED ORDER — PANTOPRAZOLE SODIUM 40 MG PO TBEC: 40.0000 mg | DELAYED_RELEASE_TABLET | Freq: Every day | ORAL | 3 refills | Status: AC | PRN

## 2023-09-11 NOTE — Assessment & Plan Note (Signed)
Last year went down Will recheck

## 2023-09-11 NOTE — Addendum Note (Signed)
Addended by: Vincenza Hews on: 09/11/2023 12:59 PM   Modules accepted: Orders

## 2023-09-11 NOTE — Assessment & Plan Note (Signed)
Doing better now No rest pain Is on nexlizet, asa and plavix

## 2023-09-11 NOTE — Assessment & Plan Note (Signed)
Still with frequency On the tamsulosin--may want to review with Dr Lonna Cobb

## 2023-09-11 NOTE — Progress Notes (Signed)
Hearing Screening - Comments:: Has hearing aids in today. Vision Screening - Comments:: February 2024

## 2023-09-11 NOTE — Addendum Note (Signed)
Addended by: Tillman Abide I on: 09/11/2023 12:43 PM   Modules accepted: Orders

## 2023-09-11 NOTE — Progress Notes (Signed)
Subjective:    Patient ID: Brandon Lewis, male    DOB: [birth year], 89 y.o.   MRN: [id number]  HPI Here for Medicare wellness visit and follow up of chronic health conditions Reviewed advanced directives Reviewed other doctors---Dr [facility] surgeon, Dr Ferdie Ping, Dr Stoioff--urology, Dr Kowalski--cardiology, Dr Alexandria Lodge, Dr Concepcion Elk No hospitalizations or surgery in the past year Not exercising much Vision is fine Hearing is poor--even with hearing aides Rare beer No tobacco No falls No depression or anhedonia--just gets frustrated at times Independent with instrumental ADLs No sig memory issues---perhaps a  little slower  Has had increased urinary frequency No pain or blood Still gets intermittent BCG for the bladder tumor Nocturia x 5--but gets back to sleep  Leg circulation is better---he feels (and slight improvement on his last check up) Not much claudication--but not exercising as much  Most recent GFR was 42--45-52 in years prior  No chest pain No palpitations No SOB No syncope--but has some dizziness if he gets up quick  Checks sugars every other day Running 120-160 No foot numbness or burning in feet----just uses pad for a callous  Current Outpatient Medications on File Prior to Visit  Medication Sig Dispense Refill   aspirin EC 81 MG tablet Take 81 mg by mouth in the morning. Swallow whole.     calcium carbonate (TUMS - DOSED IN MG ELEMENTAL CALCIUM) 500 MG chewable tablet Chew 1,000 mg by mouth daily as needed for indigestion or heartburn.     Cholecalciferol (VITAMIN D) 50 MCG (2000 UT) CAPS Take 2,000 Units by mouth in the morning.     clopidogrel (PLAVIX) 75 MG tablet *VIAL ONLY* TAKE ONE TABLET BY MOUTH EVERY DAY 90 tablet 3   hydrocortisone (ANUSOL-HC) 2.5 % rectal cream Place 1 application rectally daily as needed for hemorrhoids or anal itching.     MAGNESIUM GLYCINATE PO Take 500 mg by mouth every evening.     metFORMIN  (GLUCOPHAGE-XR) 500 MG 24 hr tablet * VIAL* TAKE 2 TABLETS (1000MG ) BY MOUTH ONCE DAILY WITH BREAKFAST *TAKE WITH FOOD* *DO NOT CRUSH OR CHEW* *BOTTLE* 60 tablet 11   metoprolol succinate (TOPROL-XL) 50 MG 24 hr tablet **VIAL ONLY* TAKE (1) TABLET BY MOUTH ONCE A DAY.DO NOT CRUSH. (HIGHBLOOD PRESSURE)  *DO NOT CRUSH OR CHEW* 30 tablet 11   metroNIDAZOLE (METROGEL) 0.75 % gel APPLY 1 APPLICATION TOPICALLY TWICE DAILY 45 g 11   Multiple Vitamin (MULTIVITAMIN WITH MINERALS) TABS tablet Take 1 tablet by mouth daily. Centrum Silver     NEXLIZET 180-10 MG TABS Take 1 tablet by mouth daily.     pantoprazole (PROTONIX) 40 MG tablet Take 40 mg by mouth daily as needed (indigestion/heartburn).     sodium chloride (OCEAN) 0.65 % nasal spray Place 1 spray into the nose daily as needed (sinus congestion).     tamsulosin (FLOMAX) 0.4 MG CAPS capsule Take 1 capsule (0.4 mg total) by mouth daily. 90 capsule 3   TURMERIC CURCUMIN PO Take 2 tablets by mouth daily.     No current facility-administered medications on file prior to visit.    Allergies  Allergen Reactions   Cilostazol Other (See Comments)    Bowel upset   Simvastatin Other (See Comments)    myalgia    Past Medical History:  Diagnosis Date   Anemia    Aortic atherosclerosis (HCC)    Atrial flutter, paroxysmal (HCC)    a.) postoperatively following CABG   Bladder cancer (HCC) 04/2020   BPH (benign  prostatic hyperplasia)    CAD (coronary artery disease) 09/12/2019   a.) LHC 09/12/2019: EF 55-60%; 85% m-dLM, 50% pLCx, 85% p-m LAD, 70% pLAD, 45% mLAD, 70% p-mRCA. b.) 4v CABG 09/16/2019.   CKD (chronic kidney disease), stage III (HCC)    Diverticulosis    GERD (gastroesophageal reflux disease)    History of kidney stones    HTN (hypertension)    Hyperlipidemia    Long term current use of antithrombotics/antiplatelets    a.) DAPT therapies (ASA + clopidogrel)   NSTEMI (non-ST elevated myocardial infarction) (HCC) 09/11/2019   a.) LHC  09/12/2019: 85% m-dLM, 50% pLCx, 85% p-m LAD, 70% pLAD, 45% mLAD, 70% p-mRCA; transferred to [facility] for CVTS consult. Underwent 4v CABG on 09/16/2019.   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   a.) 4v CABG: LIMA-LAD, SVG-RCA, SVG-OM1, SVG-D1   Small bowel obstruction (HCC)    after surgeries   Thoracic ascending aortic aneurysm (HCC)    a.) measured 4.1 cm by CT on 09/14/2019    Past Surgical History:  Procedure Laterality Date   aorto-bifem  2003   [facility]    CAROTID STENT     [facility]   CATARACT EXTRACTION, BILATERAL  2017   CHOLECYSTECTOMY  2002   COLONOSCOPY     COLONOSCOPY WITH PROPOFOL N/A 07/17/2021   Procedure: COLONOSCOPY WITH PROPOFOL;  Surgeon: Wyline Mood, MD;  Location: [facility] ENDOSCOPY;  Service: Gastroenterology;  Laterality: N/A;   CORONARY ARTERY BYPASS GRAFT N/A 09/16/2019   Procedure: CORONARY ARTERY BYPASS GRAFTING (CABG) using LIMA to LAD; Endoscopic right saphenous vein harvest to OM1, Diag1, and RCA.;  Surgeon: Linden Dolin, MD;  Location: [facility] OR;  Service: Open Heart Surgery;  Laterality: N/A;   CYSTOSCOPY WITH BIOPSY N/A 10/08/2021   Procedure: CYSTOSCOPY WITH BLADDER BIOPSY;  Surgeon: Riki Altes, MD;  Location: [facility] ORS;  Service: Urology;  Laterality: N/A;   ENDARTERECTOMY FEMORAL Left 03/13/2021   Procedure: ENDARTERECTOMY FEMORAL (GROIN REVISION);  Surgeon: Renford Dills, MD;  Location: [facility] ORS;  Service: Vascular;  Laterality: Left;   LEFT HEART CATH AND CORONARY ANGIOGRAPHY N/A 09/12/2019   Procedure: LEFT HEART CATH AND CORONARY ANGIOGRAPHY;  Surgeon: Lamar Blinks, MD;  Location: [facility] INVASIVE CV LAB;  Service: Cardiovascular;  Laterality: N/A;   LITHOTRIPSY     LOWER EXTREMITY ANGIOGRAPHY Left 01/15/2021    Procedure: LOWER EXTREMITY ANGIOGRAPHY;  Surgeon: Renford Dills, MD;  Location: [facility] INVASIVE CV LAB;  Service: Cardiovascular;  Laterality: Left;   TEE WITHOUT CARDIOVERSION N/A 09/16/2019   Procedure: TRANSESOPHAGEAL ECHOCARDIOGRAM (TEE);  Surgeon: Linden Dolin, MD;  Location: [facility] OR;  Service: Open Heart Surgery;  Laterality: N/A;   TONSILLECTOMY     TRANSURETHRAL RESECTION OF BLADDER TUMOR  10/08/2021   Procedure: TRANSURETHRAL RESECTION OF BLADDER TUMOR (TURBT);  Surgeon: Riki Altes, MD;  Location: [facility] ORS;  Service: Urology;;   TRANSURETHRAL RESECTION OF BLADDER TUMOR WITH MITOMYCIN-C N/A 07/24/2020   Procedure: TRANSURETHRAL RESECTION OF BLADDER TUMOR WITH Gemcitabine;  Surgeon: Riki Altes, MD;  Location: [facility] ORS;  Service: Urology;  Laterality: N/A;   WISDOM TOOTH EXTRACTION      Family History  Problem Relation Age of Onset   Diabetes Father  DM - and brother    Stroke Father    Coronary artery disease Paternal Grandfather    Colon cancer Neg Hx    Prostate cancer Neg Hx     Social History   Socioeconomic History   Marital status: Married    Spouse name: Not on file   Number of children: 3   Years of education: Not on file   Highest education level: Professional school degree (e.g., MD, DDS, DVM, JD)  Occupational History   Occupation: Merchant navy officer    Comment: Retired  Tobacco Use   Smoking status: Former    Current packs/day: 0.00    Average packs/day: 1 pack/day for 20.0 years (20.0 ttl pk-yrs)    Types: Cigarettes    Start date: 10/20/1972    Quit date: 10/20/1992    Years since quitting: 30.9    Passive exposure: Never   Smokeless tobacco: Never   Tobacco comments:    quit in 1994   Vaping Use   Vaping status: Never Used  Substance and Sexual Activity   Alcohol use: Yes    Comment: rare   Drug use: No   Sexual activity: Not on file  Other Topics Concern   Not on file  Social History Narrative   Widowed; then  remarried; 3 sons.      Has a living will    Son Jena Gauss should be health care POA---with wife's input   Would want resuscitation attempts   Would accept brief trial of artificial nutrition      Pt signed designated party release form and gives Laquentin Pregler [phone] (home #), access to medical records. Can also leave msg on home answering machine. Cell # [phone]   Social Determinants of Health   Financial Resource Strain: Low Risk  (03/04/2023)   Overall Financial Resource Strain (CARDIA)    Difficulty of Paying Living Expenses: Not very hard  Food Insecurity: No Food Insecurity (03/04/2023)   Hunger Vital Sign    Worried About Running Out of Food in the Last Year: Never true    Ran Out of Food in the Last Year: Never true  Transportation Needs: No Transportation Needs (03/04/2023)   PRAPARE - Administrator, Civil Service (Medical): No    Lack of Transportation (Non-Medical): No  Physical Activity: Insufficiently Active (03/04/2023)   Exercise Vital Sign    Days of Exercise per Week: 3 days    Minutes of Exercise per Session: 30 min  Stress: No Stress Concern Present (03/04/2023)   [employer] of Occupational Health - Occupational Stress Questionnaire    Feeling of Stress : Not at all  Social Connections: Socially Integrated (03/04/2023)   Social Connection and Isolation Panel [NHANES]    Frequency of Communication with Friends and Family: More than three times a week    Frequency of Social Gatherings with Friends and Family: Once a week    Attends Religious Services: More than 4 times per year    Active Member of [employer] or Organizations: Yes    Attends Banker Meetings: Not on file    Marital Status: Married  Catering manager Violence: Not on file   Review of Systems Appetite is fine Weight is down some--working on it Wears seat belt Teeth are okay--keeps up with dentist (minor work needed) No suspicious skin lesions Episodic back pain--no  meds. No other joint issues Bowels are fine--no blood Occ heartburn --uses tums prn. Uses pantoprazole occasionally    Objective:  Physical Exam Constitutional:      Appearance: Normal appearance.  HENT:     Mouth/Throat:     Pharynx: No oropharyngeal exudate or posterior oropharyngeal erythema.  Eyes:     Conjunctiva/sclera: Conjunctivae normal.     Pupils: Pupils are equal, round, and reactive to light.  Cardiovascular:     Rate and Rhythm: Normal rate and regular rhythm.     Heart sounds: No murmur heard.    No gallop.     Comments: Feel cool--no palpable pulses  Pulmonary:     Effort: Pulmonary effort is normal.     Breath sounds: Normal breath sounds. No wheezing or rales.  Abdominal:     Palpations: Abdomen is soft.     Tenderness: There is no abdominal tenderness.  Musculoskeletal:     Cervical back: Neck supple.     Right lower leg: No edema.     Left lower leg: No edema.  Lymphadenopathy:     Cervical: No cervical adenopathy.  Skin:    Findings: No lesion or rash.     Comments: No foot lesions  Neurological:     General: No focal deficit present.     Mental Status: He is alert and oriented to person, place, and time.     Comments: Word naming--9/1 minute Recall 2/3 Sensation in feet---fairly normal  Psychiatric:        Mood and Affect: Mood normal.        Behavior: Behavior normal.            Assessment & Plan:

## 2023-09-11 NOTE — Assessment & Plan Note (Signed)
I have personally reviewed the Medicare Annual Wellness questionnaire and have noted 1. The patient's medical and social history 2. Their use of alcohol, tobacco or illicit drugs 3. Their current medications and supplements 4. The patient's functional ability including ADL's, fall risks, home safety risks and hearing or visual             impairment. 5. Diet and physical activities 6. Evidence for depression or mood disorders  The patients weight, height, BMI and visual acuity have been recorded in the chart I have made referrals, counseling and provided education to the patient based review of the above and I have provided the pt with a written personalized care plan for preventive services.  I have provided you with a copy of your personalized plan for preventive services. Please take the time to review along with your updated medication list.  Done with cancer screening Needs to get back to more regular exercise Td, 2nd shingrix and RSV at the pharmacy Had flu/COVID vaccines

## 2023-09-11 NOTE — Assessment & Plan Note (Signed)
Seems to have reasonable control on the metfomrin 1000 daily

## 2023-09-11 NOTE — Assessment & Plan Note (Signed)
No apparent recurrence Is on metoprolol 50 daily

## 2023-09-11 NOTE — Addendum Note (Signed)
Addended by: Alvina Chou on: 09/11/2023 03:09 PM   Modules accepted: Orders

## 2023-09-12 LAB — MICROALBUMIN / CREATININE URINE RATIO
Creatinine, Urine: 74 mg/dL (ref 20–320)
Microalb Creat Ratio: 226 mg/g{creat} — ABNORMAL HIGH (ref <30)
Microalb, Ur: 16.7 mg/dL

## 2023-09-12 LAB — PARATHYROID HORMONE, INTACT (NO CA): PTH: 71 pg/mL (ref 16–77)

## 2023-10-09 ENCOUNTER — Ambulatory Visit: Payer: Medicare PPO | Admitting: Urology

## 2023-10-09 ENCOUNTER — Encounter: Payer: Self-pay | Admitting: Urology

## 2023-10-09 DIAGNOSIS — Z8551 Personal history of malignant neoplasm of bladder: Secondary | ICD-10-CM

## 2023-10-09 DIAGNOSIS — N320 Bladder-neck obstruction: Secondary | ICD-10-CM

## 2023-10-09 DIAGNOSIS — N401 Enlarged prostate with lower urinary tract symptoms: Secondary | ICD-10-CM | POA: Diagnosis not present

## 2023-10-09 DIAGNOSIS — D494 Neoplasm of unspecified behavior of bladder: Secondary | ICD-10-CM | POA: Diagnosis not present

## 2023-10-09 LAB — MICROSCOPIC EXAMINATION: Bacteria, UA: NONE SEEN

## 2023-10-09 LAB — URINALYSIS, COMPLETE
Bilirubin, UA: NEGATIVE
Glucose, UA: NEGATIVE
Ketones, UA: NEGATIVE
Leukocytes,UA: NEGATIVE
Nitrite, UA: NEGATIVE
Protein,UA: NEGATIVE
RBC, UA: NEGATIVE
Specific Gravity, UA: 1.015 (ref 1.005–1.030)
Urobilinogen, Ur: 0.2 mg/dL (ref 0.2–1.0)
pH, UA: 5.5 (ref 5.0–7.5)

## 2023-10-09 NOTE — Progress Notes (Signed)
   10/09/23  CC:  Chief Complaint  Patient presents with   Cysto    Urologic history: 1.  Ta urothelial carcinoma bladder, high-grade TURBT 07/24/2020; ~ 2 cm right posterior wall tumor Induction BCG completed 11/09/2020 which was tolerated well Maintenance BCG (received only 1 dose May 2022 and doses 2/22 May 2021 due to shortage TURBT 10/08/2021 for abnormal mucosa early papillary change just inside the left bladder neck; Path high-grade Ta Reinduction BCG x6 completed 01/10/2022 Maintenance BCG x3 completed: 04/15/2022, 11/08/2022, 05/26/2023   HPI: Mr. Schrandt has no complaints today.  Denies gross hematuria.  UA negative  Blood pressure 126/74, pulse 82,  weight 190 lb   Cystoscopy Procedure Note  Patient identification was confirmed, informed consent was obtained, and patient was prepped using Betadine solution.  Lidocaine jelly was administered per urethral meatus.     Pre-Procedure: - Inspection reveals a normal caliber urethral meatus.  Procedure: The flexible cystoscope was introduced without difficulty - No urethral strictures/lesions are present. -  Moderate lateral lobe enlargement  prostate  - Mild elevation bladder neck - Bilateral ureteral orifices identified - Bladder mucosa without erythema, solid or papillary lesions - No bladder stones - Mild trabeculation  Retroflexion shows no abnormalities/tumor   Post-Procedure: - Patient tolerated the procedure well  Assessment/ Plan: No endoscopic evidence recurrent urothelial carcinoma He is 2 years without recurrence and will move to every 6 month surveillance cystoscopy The desire to continue maintenance BCG with next dose due February 2025    Riki Altes, MD

## 2023-10-16 ENCOUNTER — Telehealth: Payer: Self-pay | Admitting: *Deleted

## 2023-10-16 NOTE — Telephone Encounter (Signed)
Brandon Lewis [id number] [birth year]   Provider- stoioff    Procedure [id number]  Drug [id number]    Urothelial Carcinoma of Bladder -  C67.9  Expected date of instillation __________n/a______    Below to be completed by staff member contacting insurance.   BCG verification completed- Yes  Pa needed- No  Insurance contacted - complete         Auth number:______not given _______________  Approval dates : _____2025_____________      BCG scheduled:______________________________________   Pt aware and instructions given.   JQ aware to order Bcg.  Date: 10/16/2023

## 2023-10-28 DIAGNOSIS — Z951 Presence of aortocoronary bypass graft: Secondary | ICD-10-CM | POA: Diagnosis not present

## 2023-10-28 DIAGNOSIS — I10 Essential (primary) hypertension: Secondary | ICD-10-CM | POA: Diagnosis not present

## 2023-10-28 DIAGNOSIS — I739 Peripheral vascular disease, unspecified: Secondary | ICD-10-CM | POA: Diagnosis not present

## 2023-10-28 DIAGNOSIS — I214 Non-ST elevation (NSTEMI) myocardial infarction: Secondary | ICD-10-CM | POA: Diagnosis not present

## 2023-10-28 DIAGNOSIS — I4892 Unspecified atrial flutter: Secondary | ICD-10-CM | POA: Diagnosis not present

## 2023-10-28 DIAGNOSIS — I7121 Aneurysm of the ascending aorta, without rupture: Secondary | ICD-10-CM | POA: Diagnosis not present

## 2023-10-28 DIAGNOSIS — I25119 Atherosclerotic heart disease of native coronary artery with unspecified angina pectoris: Secondary | ICD-10-CM | POA: Diagnosis not present

## 2023-10-28 NOTE — Progress Notes (Signed)
 Established Patient Visit   Chief Complaint: Chief Complaint  Patient presents with  . Dizziness    Occas; patient stated it doesn't last long when it comes    Date of Service: 10/28/2023 Date of Birth: [birth year] PCP: Jimmy Charlie FERNS, MD  History of Present Illness: Mr. Scibilia is a 89 y.o.male patient with a past medical history of peripheral arterial disease s/p femoral artery bypass 2003, coronary artery disease s/p CABG x4 on 09/16/2019 with a LIMA to LAD, SVG to RCA, OM1 and D1, atrial flutter postoperatively, hypertension, NSTEMI, hyperlipidemia.  Patient here today for routine 15-month follow-up visit.    Overall states he has been feeling well over the last 9 months with no new cardiovascular complaints or symptoms at this time.  had echocardiogram in 2023 showing normal LV and RV systolic function with mild LVH, no significant valvular abnormalities.  Lexiscan  in 2023 showing no evidence of myocardial ischemia.  Patient had been on rosuvastatin  for several years, although at last appointment had stated that he was having joint pain worsening over the last few years that he felt may be secondary to statin use.  Patient states that he would like to try bempedoic acid-Zetia which he has been reading about online.  Had a conversation with him that this is unlikely to control his cholesterol is much as the rosuvastatin , I recommended an PCSK9 inhibitor instead although patient is not interested in PCSK9 at this time.  Patient was changed to bempedoic acid-Zetia per patient preference at last appointment. He states that he feels like his joint pain has improved since switching medicine 9 months ago. His LDL has since increased from 46 to 85 by last labwork 08/2023.   Past Medical and Surgical History  Past Medical History Past Medical History:  Diagnosis Date  . History of cancer 04/2020   Bladder Cancer   . Hyperlipidemia     Past Surgical History He has no past surgical history on  file.   Medications and Allergies  Current Medications  Current Outpatient Medications on File Prior to Visit  Medication Sig Dispense Refill  . aspirin  81 MG chewable tablet Take 81 mg by mouth once daily    . cholecalciferol  (VITAMIN D3) 2,000 unit tablet Take 4,000 Units by mouth every morning before breakfast    . clopidogreL  (PLAVIX ) 75 mg tablet Take 75 mg by mouth once daily       . gentamicin  (GARAMYCIN ) 0.1 % ointment Apply topically as needed    . hydrocortisone  2.5 % cream Apply topically as needed    . MAGNESIUM  GLYCINATE ORAL Take 500 mg by mouth once daily    . metFORMIN  (GLUCOPHAGE -XR) 500 MG XR tablet Take 1,000 mg by mouth daily with dinner    . metoprolol  succinate (TOPROL -XL) 50 MG XL tablet **VIAL ONLY** TAKE (1) TABLET BY MOUTH ONCE A DAY.DO NOT CRUSH. (HIGHBLOOD PRESSURE) *BOTTLE*    . multivitamin-lutein (CENTRUM SILVER) tablet Take 1 tablet by mouth every morning before breakfast    . NEXLIZET 180-10 mg Tab TAKE 1 TABLET BY MOUTH ONCE DAILY  *DISPENSE IN ORIGINAL CONTAINER* 30 tablet 10  . pantoprazole  (PROTONIX ) 40 MG DR tablet Take 40 mg by mouth as needed    . sodium chloride  (AYR) 0.65 % nasal drops Place 1 drop into one nostril as needed    . tamsulosin  (FLOMAX ) 0.4 mg capsule Take 0.4 mg by mouth once daily        No current facility-administered medications on file prior to  visit.    Allergies: Cilostazol and Simvastatin  Social and Family History  Social History  reports that he has quit smoking. His smoking use included cigarettes. He has never used smokeless tobacco.  Family History No family history on file.  Review of Systems   Review of Systems  Positive for none  Negative for weight gain weight loss, weakness, vision change, hearing loss, cough, congestion, PND, orthopnea, heartburn, nausea, diaphoresis, vomiting, diarrhea, bloody stool, melena, stomach pain, extremity pain, leg weakness, leg cramping, leg blood clots, headache, blackouts,  nosebleed, trouble swallowing, mouth pain, urinary frequency, urination at night, muscle weakness, skin lesions, skin rashes, tingling ,ulcers, numbness, anxiety, and/or depression Physical Examination   Vitals:BP 122/60 (BP Location: Left upper arm, Patient Position: Sitting, BP Cuff Size: Adult)   Pulse 79   Resp 15   Ht 177.8 cm (5' 10)   Wt 84.9 kg (187 lb 3.2 oz)   SpO2 99%   BMI 26.86 kg/m  Ht:177.8 cm (5' 10) Wt:84.9 kg (187 lb 3.2 oz) ADJ:Anib surface area is 2.05 meters squared. Body mass index is 26.86 kg/m. Appearance: well appearing in no acute distress HEENT: Pupils equally reactive to light and accomodation, no xanthalasma   Neck: Supple, no apparent thyromegaly, masses, or lymphadenopathy  Lungs: normal respiratory effort; no crackles, no rhonchi, no wheezes Heart: Regular rate and rhythm. Normal S1 S2  No gallops, murmur, no rub, PMI is normal size and placement. carotid upstroke normal without bruit. Jugular venous pressure is normal Abdomen: soft, nontender, not distended with normal bowel sounds. No apparent hepatosplenomegally. Abdominal aorta is normal size without bruit Extremities: no edema, no ulcers, no clubbing, no cyanosis Peripheral Pulses: 2+ in upper extremities, 2+ femoral pulses bilaterally, 2+lower extremity  Musculoskeletal;  Normal muscle tone without kyphosis Neurological:   Oriented and Alert, Cranial nerves intact  Assessment   89 y.o. male with  Encounter Diagnoses  Name Primary?  . Atrial flutter, unspecified type (CMS/HHS-HCC) Yes  . Essential hypertension, benign   . S/P CABG x 4   . Coronary artery disease involving native coronary artery of native heart with angina pectoris (CMS-HCC)   . NSTEMI (non-ST elevated myocardial infarction) (CMS/HHS-HCC)   . Peripheral arterial disease (CMS-HCC)   . Ascending aortic aneurysm, unspecified whether ruptured (CMS-HCC)         Plan    1.  Coronary artery disease: With no complaints of  angina or anginal equivalent at this time -Continue aspirin , Plavix , metoprolol  with no changes. Discussed risks of DAPT long term including increased risk of bleeding complications. Recommended reducing to single antiplatelet therapy, patient stated he wants to continue DAPT at this time -Continue bempedoic acid-Zetia in place of rosuvastatin  per patient preference.  Discussed that his LDL is no longer at goal.  Goal would be LDL <70 with his history of CABG.  Again recommended Praluent as alternative, patient would prefer to stay on bempedoic acid-Zetia at this time and will start lifestyle changes to help further reduce his cholesterol   2.  Atrial flutter: Noted postoperatively after his bypass grafting in 2020 with no recurrences since that time.   -No changes to current management   3.  Hyperlipidemia/PVD:  Last LDL of 85 -Continue bempedoic acid-Zetia in place of rosuvastatin  per patient preference.  Discussed that his LDL is no longer at goal.  Goal would be LDL <70 with his history of CABG.  Again recommended Praluent as alternative, patient would prefer to stay on bempedoic acid-Zetia at this time  and will start lifestyle changes to help further reduce his cholesterol -Continue routine follow-up with [facility]   4.  Hypertension: stable in the office today at 122/60  -Continue metoprolol  with no changes   Orders Placed This Encounter  Procedures  . ECG 12-lead    Return in about 9 months (around 07/27/2024).   Attestation Statement:   I personally performed the service, non-incident to. (WP)   AMANDA MARIE Irwin, [place]

## 2023-11-23 ENCOUNTER — Telehealth: Payer: Self-pay | Admitting: Pharmacy Technician

## 2023-11-23 NOTE — Telephone Encounter (Signed)
Auth Submission: NO AUTH NEEDED Site of care: [facility] Payer: [employer] MEDICARE Medication & CPT/J Code(s) submitted: QQVZ D63875 Route of submission (phone, fax, portal):  Phone # Fax # Auth type: Buy/Bill PB Units/visits requested:  Reference number: [id number] Approval from: 11/23/23 to 10/19/24

## 2023-12-08 ENCOUNTER — Other Ambulatory Visit: Payer: Self-pay | Admitting: Internal Medicine

## 2023-12-08 DIAGNOSIS — E113393 Type 2 diabetes mellitus with moderate nonproliferative diabetic retinopathy without macular edema, bilateral: Secondary | ICD-10-CM | POA: Diagnosis not present

## 2023-12-08 NOTE — Progress Notes (Unsigned)
BCG Bladder Instillation  BCG # 1/3  Due to Bladder Cancer patient is present today for a BCG treatment. Patient was cleaned and prepped in a sterile fashion with betadine. A 14 FR catheter was inserted, urine return was noted 20 ml, urine was yellow in color.  50ml of reconstituted BCG was instilled into the bladder. The catheter was then removed. Patient tolerated well, no complications were noted  Performed by: Michiel Cowboy, PA-C and Humberta Magallon-Mariche, CMA    Follow up/ Additional notes: Follow up in one week for # 2/3 BCG

## 2023-12-10 ENCOUNTER — Ambulatory Visit: Payer: Self-pay | Admitting: Urology

## 2023-12-10 VITALS — BP 106/64 | HR 86

## 2023-12-10 DIAGNOSIS — C679 Malignant neoplasm of bladder, unspecified: Secondary | ICD-10-CM

## 2023-12-10 LAB — URINALYSIS, COMPLETE
Bilirubin, UA: NEGATIVE
Glucose, UA: NEGATIVE
Ketones, UA: NEGATIVE
Leukocytes,UA: NEGATIVE
Nitrite, UA: NEGATIVE
RBC, UA: NEGATIVE
Specific Gravity, UA: 1.02 (ref 1.005–1.030)
Urobilinogen, Ur: 0.2 mg/dL (ref 0.2–1.0)
pH, UA: 7 (ref 5.0–7.5)

## 2023-12-10 LAB — MICROSCOPIC EXAMINATION

## 2023-12-10 MED ORDER — BCG LIVE 50 MG IS SUSR
3.2400 mL | Freq: Once | INTRAVESICAL | Status: AC
  Administered 2023-12-10: 81 mg via INTRAVESICAL

## 2023-12-16 NOTE — Progress Notes (Unsigned)
 BCG Bladder Instillation  BCG # 2/3  Due to Bladder Cancer patient is present today for a BCG treatment. Patient was cleaned and prepped in a sterile fashion with betadine. A 14 FR catheter was inserted, urine return was noted 200 ml, urine was yellow in color.  50ml of reconstituted BCG was instilled into the bladder. The catheter was then removed. Patient tolerated well, no complications were noted  Performed by: Michiel Cowboy, PA-C and Darrol Angel, CMA   Follow up/ Additional notes: Return next week for number 3 out of 3 BCG.

## 2023-12-17 ENCOUNTER — Ambulatory Visit: Payer: Medicare PPO | Admitting: Urology

## 2023-12-17 ENCOUNTER — Encounter: Payer: Self-pay | Admitting: Urology

## 2023-12-17 VITALS — BP 135/84 | HR 85 | Ht 71.0 in | Wt 190.0 lb

## 2023-12-17 DIAGNOSIS — C679 Malignant neoplasm of bladder, unspecified: Secondary | ICD-10-CM | POA: Diagnosis not present

## 2023-12-17 LAB — URINALYSIS, COMPLETE
Bilirubin, UA: NEGATIVE
Glucose, UA: NEGATIVE
Ketones, UA: NEGATIVE
Leukocytes,UA: NEGATIVE
Nitrite, UA: NEGATIVE
RBC, UA: NEGATIVE
Specific Gravity, UA: 1.025 (ref 1.005–1.030)
Urobilinogen, Ur: 0.2 mg/dL (ref 0.2–1.0)
pH, UA: 5.5 (ref 5.0–7.5)

## 2023-12-17 LAB — MICROSCOPIC EXAMINATION

## 2023-12-17 MED ORDER — BCG LIVE 50 MG IS SUSR
3.2400 mL | Freq: Once | INTRAVESICAL | Status: AC
  Administered 2023-12-17: 81 mg via INTRAVESICAL

## 2023-12-23 NOTE — Progress Notes (Unsigned)
 BCG Bladder Instillation  BCG # 3/3  Due to Bladder Cancer patient is present today for a BCG treatment. Patient was cleaned and prepped in a sterile fashion with betadine. A 14 FR catheter was inserted, urine return was noted 100 ml, urine was yellow in color.  50ml of reconstituted BCG was instilled into the bladder. The catheter was then removed. Patient tolerated well, no complications were noted  Performed by: Michiel Cowboy, PA-C and Benay Pike, CMA   Follow up/ Additional notes: Cystoscopy in 6 months with Dr. Lonna Cobb

## 2023-12-24 ENCOUNTER — Encounter: Payer: Self-pay | Admitting: Urology

## 2023-12-24 ENCOUNTER — Ambulatory Visit: Payer: Medicare PPO | Admitting: Urology

## 2023-12-24 VITALS — BP 118/73 | HR 86 | Ht 70.0 in | Wt 190.0 lb

## 2023-12-24 DIAGNOSIS — C679 Malignant neoplasm of bladder, unspecified: Secondary | ICD-10-CM

## 2023-12-24 LAB — MICROSCOPIC EXAMINATION

## 2023-12-24 LAB — URINALYSIS, COMPLETE
Bilirubin, UA: NEGATIVE
Glucose, UA: NEGATIVE
Ketones, UA: NEGATIVE
Leukocytes,UA: NEGATIVE
Nitrite, UA: NEGATIVE
RBC, UA: NEGATIVE
Specific Gravity, UA: 1.02 (ref 1.005–1.030)
Urobilinogen, Ur: 0.2 mg/dL (ref 0.2–1.0)
pH, UA: 5 (ref 5.0–7.5)

## 2023-12-24 MED ORDER — BCG LIVE 50 MG IS SUSR
3.2400 mL | Freq: Once | INTRAVESICAL | Status: AC
  Administered 2023-12-24: 81 mg via INTRAVESICAL

## 2024-02-22 DIAGNOSIS — H6123 Impacted cerumen, bilateral: Secondary | ICD-10-CM | POA: Diagnosis not present

## 2024-02-22 DIAGNOSIS — H903 Sensorineural hearing loss, bilateral: Secondary | ICD-10-CM | POA: Diagnosis not present

## 2024-02-25 ENCOUNTER — Telehealth: Payer: Self-pay

## 2024-02-25 NOTE — Telephone Encounter (Signed)
 Left message for patient to call and reschedule 5/16 appointment

## 2024-03-04 ENCOUNTER — Ambulatory Visit: Payer: Medicare PPO | Admitting: Internal Medicine

## 2024-03-07 ENCOUNTER — Ambulatory Visit: Admitting: Internal Medicine

## 2024-03-07 ENCOUNTER — Telehealth: Payer: Self-pay

## 2024-03-07 ENCOUNTER — Ambulatory Visit: Payer: Self-pay | Admitting: Internal Medicine

## 2024-03-07 VITALS — BP 122/70 | HR 88 | Temp 98.8°F | Ht 70.0 in | Wt 191.0 lb

## 2024-03-07 DIAGNOSIS — I739 Peripheral vascular disease, unspecified: Secondary | ICD-10-CM

## 2024-03-07 DIAGNOSIS — J069 Acute upper respiratory infection, unspecified: Secondary | ICD-10-CM | POA: Diagnosis not present

## 2024-03-07 DIAGNOSIS — N1832 Chronic kidney disease, stage 3b: Secondary | ICD-10-CM | POA: Diagnosis not present

## 2024-03-07 DIAGNOSIS — E1159 Type 2 diabetes mellitus with other circulatory complications: Secondary | ICD-10-CM

## 2024-03-07 DIAGNOSIS — Z7984 Long term (current) use of oral hypoglycemic drugs: Secondary | ICD-10-CM | POA: Diagnosis not present

## 2024-03-07 LAB — POCT GLYCOSYLATED HEMOGLOBIN (HGB A1C): Hemoglobin A1C: 5.9 % — AB (ref 4.0–5.6)

## 2024-03-07 MED ORDER — FLUTICASONE PROPIONATE 50 MCG/ACT NA SUSP: 2.0000 | Freq: Every day | NASAL | 11 refills | Status: AC

## 2024-03-07 NOTE — Progress Notes (Signed)
 Subjective:    Patient ID: Brandon Lewis, male    DOB: [birth year], 89 y.o.   MRN: [id number]  HPI Here for follow up of diabetes --and has respiratory infection  Had some stress with wife---in hospital briefly Now doing better  Started with sinus symptoms 2 days ago Has post nasal drip Some throat Thick mucus but white still--tinge green Feels some better today--but voice off No fever---or ?low grade No SOB  Occasionally checks sugars---usually ~120 No foot numbness or burning  No chest pain  Last GFR 34 He would like to see a kidney specialist now  Current Outpatient Medications on File Prior to Visit  Medication Sig Dispense Refill   aspirin  EC 81 MG tablet Take 81 mg by mouth in the morning. Swallow whole.     calcium  carbonate (TUMS - DOSED IN MG ELEMENTAL CALCIUM ) 500 MG chewable tablet Chew 1,000 mg by mouth daily as needed for indigestion or heartburn.     Cholecalciferol  (VITAMIN D ) 50 MCG (2000 UT) CAPS Take 2,000 Units by mouth in the morning.     clopidogrel  (PLAVIX ) 75 MG tablet *VIAL ONLY* TAKE ONE TABLET BY MOUTH EVERY DAY 90 tablet 3   hydrocortisone  (ANUSOL -HC) 2.5 % rectal cream Place 1 application rectally daily as needed for hemorrhoids or anal itching.     MAGNESIUM  GLYCINATE PO Take 500 mg by mouth every evening.     metFORMIN  (GLUCOPHAGE -XR) 500 MG 24 hr tablet * VIAL* TAKE 2 TABLETS (1000MG ) BY MOUTH ONCE DAILY WITH BREAKFAST *TAKE WITH FOOD* *DO NOT CRUSH OR CHEW* *BOTTLE* 60 tablet 11   metoprolol  succinate (TOPROL -XL) 50 MG 24 hr tablet **VIAL ONLY* TAKE (1) TABLET BY MOUTH ONCE A DAY.DO NOT CRUSH. (HIGHBLOOD PRESSURE)  *DO NOT CRUSH OR CHEW* 30 tablet 11   metroNIDAZOLE  (METROGEL ) 0.75 % gel APPLY 1 APPLICATION TOPICALLY TWICE DAILY 45 g 11   Multiple Vitamin (MULTIVITAMIN WITH MINERALS) TABS tablet Take 1 tablet by mouth daily. Centrum Silver     NEXLIZET 180-10 MG TABS Take 1 tablet by mouth daily.     pantoprazole  (PROTONIX ) 40 MG tablet  Take 1 tablet (40 mg total) by mouth daily as needed (indigestion/heartburn). 90 tablet 3   sodium chloride  (OCEAN) 0.65 % nasal spray Place 1 spray into the nose daily as needed (sinus congestion).     tamsulosin  (FLOMAX ) 0.4 MG CAPS capsule TAKE 1 CAPSULE BY MOUTH EVERY DAY 90 capsule 3   TURMERIC CURCUMIN PO Take 2 tablets by mouth daily.     No current facility-administered medications on file prior to visit.    Allergies  Allergen Reactions   Cilostazol Other (See Comments)    Bowel upset   Simvastatin Other (See Comments)    myalgia    Past Medical History:  Diagnosis Date   Anemia    Aortic atherosclerosis (HCC)    Atrial flutter, paroxysmal (HCC)    a.) postoperatively following CABG   Bladder cancer (HCC) 04/2020   BPH (benign prostatic hyperplasia)    CAD (coronary artery disease) 09/12/2019   a.) LHC 09/12/2019: EF 55-60%; 85% m-dLM, 50% pLCx, 85% p-m LAD, 70% pLAD, 45% mLAD, 70% p-mRCA. b.) 4v CABG 09/16/2019.   CKD (chronic kidney disease), stage III (HCC)    Diverticulosis    GERD (gastroesophageal reflux disease)    History of kidney stones    HTN (hypertension)    Hyperlipidemia    Long term current use of antithrombotics/antiplatelets    a.) DAPT therapies (ASA +  clopidogrel )   NSTEMI (non-ST elevated myocardial infarction) (HCC) 09/11/2019   a.) LHC 09/12/2019: 85% m-dLM, 50% pLCx, 85% p-m LAD, 70% pLAD, 45% mLAD, 70% p-mRCA; transferred to [facility] for CVTS consult. Underwent 4v CABG on 09/16/2019.   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   a.) 4v CABG: LIMA-LAD, SVG-RCA, SVG-OM1, SVG-D1   Small bowel obstruction (HCC)    after surgeries   Thoracic ascending aortic aneurysm (HCC)    a.) measured 4.1 cm by CT on 09/14/2019     Past Surgical History:  Procedure Laterality Date   aorto-bifem  2003   [facility]    CAROTID STENT     [facility]   CATARACT EXTRACTION, BILATERAL  2017   CHOLECYSTECTOMY  2002   COLONOSCOPY     COLONOSCOPY WITH PROPOFOL  N/A 07/17/2021   Procedure: COLONOSCOPY WITH PROPOFOL ;  Surgeon: Luke Salaam, MD;  Location: [facility] ENDOSCOPY;  Service: Gastroenterology;  Laterality: N/A;   CORONARY ARTERY BYPASS GRAFT N/A 09/16/2019   Procedure: CORONARY ARTERY BYPASS GRAFTING (CABG) using LIMA to LAD; Endoscopic right saphenous vein harvest to OM1, Diag1, and RCA.;  Surgeon: Rudine Cos, MD;  Location: [facility] OR;  Service: Open Heart Surgery;  Laterality: N/A;   CYSTOSCOPY WITH BIOPSY N/A 10/08/2021   Procedure: CYSTOSCOPY WITH BLADDER BIOPSY;  Surgeon: Geraline Knapp, MD;  Location: [facility] ORS;  Service: Urology;  Laterality: N/A;   ENDARTERECTOMY FEMORAL Left 03/13/2021   Procedure: ENDARTERECTOMY FEMORAL (GROIN REVISION);  Surgeon: Jackquelyn Mass, MD;  Location: [facility] ORS;  Service: Vascular;  Laterality: Left;   LEFT HEART CATH AND CORONARY ANGIOGRAPHY N/A 09/12/2019   Procedure: LEFT HEART CATH AND CORONARY ANGIOGRAPHY;  Surgeon: Michelle Aid, MD;  Location: [facility] INVASIVE CV LAB;  Service: Cardiovascular;  Laterality: N/A;   LITHOTRIPSY     LOWER EXTREMITY ANGIOGRAPHY Left 01/15/2021   Procedure: LOWER EXTREMITY ANGIOGRAPHY;  Surgeon: Jackquelyn Mass, MD;  Location: [facility] INVASIVE CV LAB;  Service: Cardiovascular;  Laterality: Left;   TEE WITHOUT CARDIOVERSION N/A 09/16/2019   Procedure: TRANSESOPHAGEAL ECHOCARDIOGRAM (TEE);  Surgeon: Rudine Cos, MD;  Location: [facility] OR;  Service: Open Heart Surgery;  Laterality: N/A;   TONSILLECTOMY     TRANSURETHRAL RESECTION OF BLADDER TUMOR  10/08/2021   Procedure: TRANSURETHRAL RESECTION OF BLADDER TUMOR (TURBT);  Surgeon: Geraline Knapp, MD;  Location: [facility] ORS;  Service: Urology;;   TRANSURETHRAL RESECTION OF BLADDER TUMOR WITH MITOMYCIN -C N/A  07/24/2020   Procedure: TRANSURETHRAL RESECTION OF BLADDER TUMOR WITH Gemcitabine ;  Surgeon: Geraline Knapp, MD;  Location: [facility] ORS;  Service: Urology;  Laterality: N/A;   WISDOM TOOTH EXTRACTION      Family History  Problem Relation Age of Onset   Diabetes Father        DM - and brother    Stroke Father    Coronary artery disease Paternal Grandfather    Colon cancer Neg Hx    Prostate cancer Neg Hx     Social History   Socioeconomic History   Marital status: Married    Spouse name: Not on file   Number of children: 3   Years of education: Not on file   Highest education level: Professional school degree (e.g., MD, DDS, DVM, JD)  Occupational History  Occupation: Merchant navy officer    Comment: Retired  Tobacco Use   Smoking status: Former    Current packs/day: 0.00    Average packs/day: 1 pack/day for 20.0 years (20.0 ttl pk-yrs)    Types: Cigarettes    Start date: 10/20/1972    Quit date: 10/20/1992    Years since quitting: 31.4    Passive exposure: Never   Smokeless tobacco: Never   Tobacco comments:    quit in 1994   Vaping Use   Vaping status: Never Used  Substance and Sexual Activity   Alcohol use: Yes    Comment: rare   Drug use: No   Sexual activity: Not on file  Other Topics Concern   Not on file  Social History Narrative   Widowed; then remarried; 3 sons.      Has a living will    Son Jock Muller should be health care POA---with wife's input   Would want resuscitation attempts   Would accept brief trial of artificial nutrition      Pt signed designated party release form and gives Fidel Caggiano [phone] (home #), access to medical records. Can also leave msg on home answering machine. Cell # [phone]   Social Drivers of Health   Financial Resource Strain: Low Risk  (03/07/2024)   Overall Financial Resource Strain (CARDIA)    Difficulty of Paying Living Expenses: Not hard at all  Food Insecurity: No Food Insecurity (03/07/2024)   Hunger Vital Sign     Worried About Running Out of Food in the Last Year: Never true    Ran Out of Food in the Last Year: Never true  Transportation Needs: No Transportation Needs (03/07/2024)   PRAPARE - Administrator, Civil Service (Medical): No    Lack of Transportation (Non-Medical): No  Physical Activity: Insufficiently Active (03/07/2024)   Exercise Vital Sign    Days of Exercise per Week: 3 days    Minutes of Exercise per Session: 30 min  Stress: No Stress Concern Present (03/07/2024)   [employer] of Occupational Health - Occupational Stress Questionnaire    Feeling of Stress : Only a little  Social Connections: Socially Integrated (03/07/2024)   Social Connection and Isolation Panel [NHANES]    Frequency of Communication with Friends and Family: More than three times a week    Frequency of Social Gatherings with Friends and Family: Three times a week    Attends Religious Services: More than 4 times per year    Active Member of Clubs or Organizations: Yes    Attends Banker Meetings: 1 to 4 times per year    Marital Status: Married  Catering manager Violence: Not on file   Review of Systems Careful about what he is eating Weight is stable    Objective:   Physical Exam Constitutional:      Appearance: Normal appearance.  HENT:     Head:     Comments: No sinus tenderness    Mouth/Throat:     Pharynx: No oropharyngeal exudate or posterior oropharyngeal erythema.  Cardiovascular:     Rate and Rhythm: Normal rate and regular rhythm.     Heart sounds: No murmur heard.    No gallop.     Comments: Feet warm with faint pulses Pulmonary:     Effort: Pulmonary effort is normal.     Breath sounds: Normal breath sounds. No wheezing or rales.  Musculoskeletal:     Cervical back: Neck supple.  Right lower leg: No edema.     Left lower leg: No edema.  Lymphadenopathy:     Cervical: No cervical adenopathy.  Skin:    Comments: No foot lesions  Neurological:      Mental Status: He is alert.            Assessment & Plan:

## 2024-03-07 NOTE — Telephone Encounter (Signed)
 Thank you. He had said in the office visit that he wanted a plate. I will follow-up with him to make sure which he wanted.

## 2024-03-07 NOTE — Assessment & Plan Note (Signed)
 Discussed self limited nature Supportive care If worsens at end of week, would treat empirically with doxy 100 bid  7 days

## 2024-03-07 NOTE — Assessment & Plan Note (Signed)
 Control has actually improved with A1c down to 5.9% Will continue the metformin  1000 daily

## 2024-03-07 NOTE — Assessment & Plan Note (Signed)
 Slightly worse Will refer to nephrology

## 2024-03-07 NOTE — Assessment & Plan Note (Signed)
 Circulation actually slightly improved the last time he was checked

## 2024-03-07 NOTE — Telephone Encounter (Signed)
 Patient needs handicap placard filled out and signed. Placed in folder. Patient will pick up when ready

## 2024-03-07 NOTE — Telephone Encounter (Signed)
 Spoke to pt. He wants a Disability Plate for his wife's car. I printed a form for the Plate and placed in Dr Alger Infield inbox on his desk.

## 2024-03-07 NOTE — Addendum Note (Signed)
 Addended by: Franne Ivory on: 03/07/2024 02:59 PM   Modules accepted: Orders

## 2024-03-08 ENCOUNTER — Encounter (INDEPENDENT_AMBULATORY_CARE_PROVIDER_SITE_OTHER): Payer: Self-pay

## 2024-03-08 NOTE — Telephone Encounter (Signed)
 Spoke to pt. Advised him the form is up front ready for pickup.

## 2024-03-11 NOTE — Telephone Encounter (Signed)
 Added pt to waitlist for Dr. Godwin Lat

## 2024-03-11 NOTE — Telephone Encounter (Signed)
 Received CRM stating pt is requesting a call back from [facility]. Call back # [phone]

## 2024-03-11 NOTE — Telephone Encounter (Signed)
 Called and spoke to pt. He had asked to be set up with Dr Godwin Lat when she starts, but had been scheduled with Jolanda Nation, NP. I canceled the appt with [facility] and will forward this to the Admin inbox in case there is a recall system to set him up in November.

## 2024-03-16 ENCOUNTER — Ambulatory Visit: Admitting: Urology

## 2024-03-16 ENCOUNTER — Encounter: Payer: Self-pay | Admitting: Urology

## 2024-03-16 VITALS — BP 156/84 | HR 86 | Ht 70.0 in | Wt 190.0 lb

## 2024-03-16 DIAGNOSIS — Z8551 Personal history of malignant neoplasm of bladder: Secondary | ICD-10-CM | POA: Diagnosis not present

## 2024-03-16 DIAGNOSIS — C679 Malignant neoplasm of bladder, unspecified: Secondary | ICD-10-CM | POA: Diagnosis not present

## 2024-03-16 DIAGNOSIS — N3281 Overactive bladder: Secondary | ICD-10-CM | POA: Diagnosis not present

## 2024-03-16 DIAGNOSIS — R310 Gross hematuria: Secondary | ICD-10-CM | POA: Diagnosis not present

## 2024-03-16 LAB — URINALYSIS, COMPLETE
Bilirubin, UA: NEGATIVE
Glucose, UA: NEGATIVE
Ketones, UA: NEGATIVE
Leukocytes,UA: NEGATIVE
Nitrite, UA: NEGATIVE
RBC, UA: NEGATIVE
Specific Gravity, UA: 1.02 (ref 1.005–1.030)
Urobilinogen, Ur: 0.2 mg/dL (ref 0.2–1.0)
pH, UA: 6 (ref 5.0–7.5)

## 2024-03-16 LAB — MICROSCOPIC EXAMINATION

## 2024-03-16 NOTE — Progress Notes (Signed)
   03/16/24  CC:  Chief Complaint  Patient presents with   Cysto    Urologic history: 1.  Ta urothelial carcinoma bladder, high-grade TURBT 07/24/2020; ~ 2 cm right posterior wall tumor Induction BCG completed 11/09/2020 which was tolerated well Maintenance BCG (received only 1 dose May 2022 and doses 2/22 May 2021 due to shortage TURBT 10/08/2021 for abnormal mucosa early papillary change just inside the left bladder neck; Path high-grade Ta Reinduction BCG x6 completed 01/10/2022 Maintenance BCG x3 completed: 04/15/2022, 11/08/2022, 05/26/2023, 12/24/2023   HPI: Mr. Hermiz has no complaints today.  Denies gross hematuria.  UA negative  See rooming tab for vitals  Cystoscopy Procedure Note  Patient identification was confirmed, informed consent was obtained, and patient was prepped using Betadine solution.  Lidocaine  jelly was administered per urethral meatus.     Pre-Procedure: - Inspection reveals a normal caliber urethral meatus.  Procedure: The flexible cystoscope was introduced without difficulty - No urethral strictures/lesions are present. - Moderate lateral lobe enlargement prostate  - Mild elevation bladder neck - Bilateral ureteral orifices identified - Bladder mucosa without erythema, solid or papillary lesions - No bladder stones - Mild trabeculation  Retroflexion shows no abnormalities/tumor   Post-Procedure: - Patient tolerated the procedure well  Assessment/ Plan: No endoscopic evidence recurrent urothelial carcinoma Surveillance cystoscopy late November 2025 Maintenance BCG September 2025 and March 2026 which will complete his maintenance course    Geraline Knapp, MD

## 2024-04-07 ENCOUNTER — Ambulatory Visit: Admitting: Internal Medicine

## 2024-04-07 VITALS — BP 122/64 | HR 76 | Temp 98.7°F | Ht 70.0 in | Wt 190.0 lb

## 2024-04-07 DIAGNOSIS — M5416 Radiculopathy, lumbar region: Secondary | ICD-10-CM | POA: Diagnosis not present

## 2024-04-07 NOTE — Progress Notes (Signed)
 Subjective:    Patient ID: Brandon Lewis, male    DOB: 11-13-1933, 89 y.o.   MRN: [id number]  HPI Here due to back pain  Feels like he is having recurrence of sciatica 2021--had neurotomy by Dr Sharl Davies Wants to go back but needs new referral  Pain is worsening again Worse with walking Back pain with radiation to right leg Especially noticeable for 6 months---but now worse  Not fond of tylenol --discussed trying  Current Outpatient Medications on File Prior to Visit  Medication Sig Dispense Refill   aspirin  EC 81 MG tablet Take 81 mg by mouth in the morning. Swallow whole.     calcium  carbonate (TUMS - DOSED IN MG ELEMENTAL CALCIUM ) 500 MG chewable tablet Chew 1,000 mg by mouth daily as needed for indigestion or heartburn.     Cholecalciferol  (VITAMIN D ) 50 MCG (2000 UT) CAPS Take 2,000 Units by mouth in the morning.     clopidogrel  (PLAVIX ) 75 MG tablet *VIAL ONLY* TAKE ONE TABLET BY MOUTH EVERY DAY 90 tablet 3   fluticasone  (FLONASE ) 50 MCG/ACT nasal spray Place 2 sprays into both nostrils daily. 16 g 11   hydrocortisone  (ANUSOL -HC) 2.5 % rectal cream Place 1 application rectally daily as needed for hemorrhoids or anal itching.     MAGNESIUM  GLYCINATE PO Take 500 mg by mouth every evening.     metFORMIN  (GLUCOPHAGE -XR) 500 MG 24 hr tablet * VIAL* TAKE 2 TABLETS (1000MG ) BY MOUTH ONCE DAILY WITH BREAKFAST *TAKE WITH FOOD* *DO NOT CRUSH OR CHEW* *BOTTLE* 60 tablet 11   metoprolol  succinate (TOPROL -XL) 50 MG 24 hr tablet **VIAL ONLY* TAKE (1) TABLET BY MOUTH ONCE A DAY.DO NOT CRUSH. (HIGHBLOOD PRESSURE)  *DO NOT CRUSH OR CHEW* 30 tablet 11   metroNIDAZOLE  (METROGEL ) 0.75 % gel APPLY 1 APPLICATION TOPICALLY TWICE DAILY 45 g 11   Multiple Vitamin (MULTIVITAMIN WITH MINERALS) TABS tablet Take 1 tablet by mouth daily. Centrum Silver     NEXLIZET 180-10 MG TABS Take 1 tablet by mouth daily.     pantoprazole  (PROTONIX ) 40 MG tablet Take 1 tablet (40 mg total) by mouth daily as needed  (indigestion/heartburn). 90 tablet 3   sodium chloride  (OCEAN) 0.65 % nasal spray Place 1 spray into the nose daily as needed (sinus congestion).     tamsulosin  (FLOMAX ) 0.4 MG CAPS capsule TAKE 1 CAPSULE BY MOUTH EVERY DAY 90 capsule 3   TURMERIC CURCUMIN PO Take 2 tablets by mouth daily.     No current facility-administered medications on file prior to visit.    Allergies  Allergen Reactions   Cilostazol Other (See Comments)    Bowel upset   Simvastatin Other (See Comments)    myalgia    Past Medical History:  Diagnosis Date   Anemia    Aortic atherosclerosis (HCC)    Atrial flutter, paroxysmal (HCC)    a.) postoperatively following CABG   Bladder cancer (HCC) 04/2020   BPH (benign prostatic hyperplasia)    CAD (coronary artery disease) 09/12/2019   a.) LHC 09/12/2019: EF 55-60%; 85% m-dLM, 50% pLCx, 85% p-m LAD, 70% pLAD, 45% mLAD, 70% p-mRCA. b.) 4v CABG 09/16/2019.   CKD (chronic kidney disease), stage III (HCC)    Diverticulosis    GERD (gastroesophageal reflux disease)    History of kidney stones    HTN (hypertension)    Hyperlipidemia    Long term current use of antithrombotics/antiplatelets    a.) DAPT therapies (ASA + clopidogrel )   NSTEMI (non-ST elevated myocardial  infarction) (HCC) 09/11/2019   a.) LHC 09/12/2019: 85% m-dLM, 50% pLCx, 85% p-m LAD, 70% pLAD, 45% mLAD, 70% p-mRCA; transferred to [facility] for [facility] consult. Underwent 4v CABG on 09/16/2019.   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   a.) 4v CABG: LIMA-LAD, SVG-RCA, SVG-OM1, SVG-D1   Small bowel obstruction (HCC)    after surgeries   Thoracic ascending aortic aneurysm (HCC)    a.) measured 4.1 cm by CT on 09/14/2019    Past Surgical History:  Procedure Laterality Date    aorto-bifem  2003   [facility]    CAROTID STENT     [facility]   CATARACT EXTRACTION, BILATERAL  2017   CHOLECYSTECTOMY  2002   COLONOSCOPY     COLONOSCOPY WITH PROPOFOL  N/A 07/17/2021   Procedure: COLONOSCOPY WITH PROPOFOL ;  Surgeon: Luke Salaam, MD;  Location: [facility] ENDOSCOPY;  Service: Gastroenterology;  Laterality: N/A;   CORONARY ARTERY BYPASS GRAFT N/A 09/16/2019   Procedure: CORONARY ARTERY BYPASS GRAFTING (CABG) using LIMA to LAD; Endoscopic right saphenous vein harvest to OM1, Diag1, and RCA.;  Surgeon: Rudine Cos, MD;  Location: [facility] OR;  Service: Open Heart Surgery;  Laterality: N/A;   CYSTOSCOPY WITH BIOPSY N/A 10/08/2021   Procedure: CYSTOSCOPY WITH BLADDER BIOPSY;  Surgeon: Geraline Knapp, MD;  Location: [facility] ORS;  Service: Urology;  Laterality: N/A;   ENDARTERECTOMY FEMORAL Left 03/13/2021   Procedure: ENDARTERECTOMY FEMORAL (GROIN REVISION);  Surgeon: Jackquelyn Mass, MD;  Location: [facility] ORS;  Service: Vascular;  Laterality: Left;   LEFT HEART CATH AND CORONARY ANGIOGRAPHY N/A 09/12/2019   Procedure: LEFT HEART CATH AND CORONARY ANGIOGRAPHY;  Surgeon: Michelle Aid, MD;  Location: [facility] INVASIVE CV LAB;  Service: Cardiovascular;  Laterality: N/A;   LITHOTRIPSY     LOWER EXTREMITY ANGIOGRAPHY Left 01/15/2021   Procedure: LOWER EXTREMITY ANGIOGRAPHY;  Surgeon: Jackquelyn Mass, MD;  Location: [facility] INVASIVE CV LAB;  Service: Cardiovascular;  Laterality: Left;   TEE WITHOUT CARDIOVERSION N/A 09/16/2019   Procedure: TRANSESOPHAGEAL ECHOCARDIOGRAM (TEE);  Surgeon: Rudine Cos, MD;  Location: [facility];  Service: Open Heart Surgery;  Laterality: N/A;   TONSILLECTOMY     TRANSURETHRAL RESECTION OF BLADDER TUMOR  10/08/2021   Procedure: TRANSURETHRAL RESECTION OF BLADDER TUMOR (TURBT);  Surgeon: Geraline Knapp, MD;  Location: [facility] ORS;  Service: Urology;;   TRANSURETHRAL RESECTION OF BLADDER TUMOR WITH MITOMYCIN -C N/A 07/24/2020   Procedure: TRANSURETHRAL RESECTION OF BLADDER  TUMOR WITH Gemcitabine ;  Surgeon: Geraline Knapp, MD;  Location: [facility] ORS;  Service: Urology;  Laterality: N/A;   WISDOM TOOTH EXTRACTION      Family History  Problem Relation Age of Onset   Diabetes Father        DM - and brother    Stroke Father    Coronary artery disease Paternal Grandfather    Colon cancer Neg Hx    Prostate cancer Neg Hx     Social History   Socioeconomic History   Marital status: Married    Spouse name: Not on file   Number of children: 3   Years of education: Not on file   Highest education level: Professional school degree (e.g., MD, DDS, DVM, JD)  Occupational History   Occupation: Merchant navy officer    Comment:  Retired  Tobacco Use   Smoking status: Former    Current packs/day: 0.00    Average packs/day: 1 pack/day for 20.0 years (20.0 ttl pk-yrs)    Types: Cigarettes    Start date: 10/20/1972    Quit date: 10/20/1992    Years since quitting: 31.4    Passive exposure: Never   Smokeless tobacco: Never   Tobacco comments:    quit in 1994   Vaping Use   Vaping status: Never Used  Substance and Sexual Activity   Alcohol use: Yes    Comment: rare   Drug use: No   Sexual activity: Not on file  Other Topics Concern   Not on file  Social History Narrative   Widowed; then remarried; 3 sons.      Has a living will    Son Jock Muller should be health care POA---with wife's input   Would want resuscitation attempts   Would accept brief trial of artificial nutrition      Pt signed designated party release form and gives Ayman Brull [phone] (home #), access to medical records. Can also leave msg on home answering machine. Cell # [phone]   Social Drivers of Health   Financial Resource Strain: Low Risk  (04/07/2024)   Overall Financial Resource Strain (CARDIA)    Difficulty of Paying Living Expenses: Not hard at all  Food Insecurity: No Food Insecurity (04/07/2024)   Hunger Vital Sign    Worried About Running Out of Food in the Last Year:  Never true    Ran Out of Food in the Last Year: Never true  Transportation Needs: No Transportation Needs (04/07/2024)   PRAPARE - Administrator, Civil Service (Medical): No    Lack of Transportation (Non-Medical): No  Physical Activity: Insufficiently Active (04/07/2024)   Exercise Vital Sign    Days of Exercise per Week: 3 days    Minutes of Exercise per Session: 30 min  Stress: No Stress Concern Present (04/07/2024)   [employer] of Occupational Health - Occupational Stress Questionnaire    Feeling of Stress: Not at all  Social Connections: Socially Integrated (04/07/2024)   Social Connection and Isolation Panel    Frequency of Communication with Friends and Family: More than three times a week    Frequency of Social Gatherings with Friends and Family: Twice a week    Attends Religious Services: 1 to 4 times per year    Active Member of [employer] or Organizations: Yes    Attends Banker Meetings: 1 to 4 times per year    Marital Status: Married  Catering manager Violence: Not on file   Review of Systems     Objective:   Physical Exam Constitutional:      Appearance: Normal appearance.   Musculoskeletal:     Comments: No spine tenderness Some right lumbar paraspinal tenderness Mild decreased internal rotation of both hips but no pain with this SLR positive on right, negative left   Neurological:     Mental Status: He is alert.     Comments: No sig leg weakness Mildly antalgic gait           Assessment & Plan:

## 2024-04-07 NOTE — Assessment & Plan Note (Signed)
 Will refer back to Dr Sharl Davies Not acute--so will hold off on prednisone  Can take tylenol  prn heat

## 2024-04-08 ENCOUNTER — Other Ambulatory Visit: Payer: Self-pay | Admitting: Urology

## 2024-04-08 ENCOUNTER — Other Ambulatory Visit: Admitting: Urology

## 2024-04-13 ENCOUNTER — Encounter: Payer: Self-pay | Admitting: Physical Medicine & Rehabilitation

## 2024-04-18 DIAGNOSIS — E785 Hyperlipidemia, unspecified: Secondary | ICD-10-CM | POA: Diagnosis not present

## 2024-04-18 DIAGNOSIS — E1122 Type 2 diabetes mellitus with diabetic chronic kidney disease: Secondary | ICD-10-CM | POA: Diagnosis not present

## 2024-04-18 DIAGNOSIS — R809 Proteinuria, unspecified: Secondary | ICD-10-CM | POA: Diagnosis not present

## 2024-04-18 DIAGNOSIS — R8290 Unspecified abnormal findings in urine: Secondary | ICD-10-CM | POA: Diagnosis not present

## 2024-04-18 DIAGNOSIS — N1832 Chronic kidney disease, stage 3b: Secondary | ICD-10-CM | POA: Diagnosis not present

## 2024-04-18 DIAGNOSIS — I129 Hypertensive chronic kidney disease with stage 1 through stage 4 chronic kidney disease, or unspecified chronic kidney disease: Secondary | ICD-10-CM | POA: Diagnosis not present

## 2024-04-19 ENCOUNTER — Other Ambulatory Visit: Payer: Self-pay | Admitting: Nephrology

## 2024-04-19 DIAGNOSIS — E1122 Type 2 diabetes mellitus with diabetic chronic kidney disease: Secondary | ICD-10-CM

## 2024-04-19 DIAGNOSIS — R8290 Unspecified abnormal findings in urine: Secondary | ICD-10-CM

## 2024-04-19 DIAGNOSIS — N1832 Chronic kidney disease, stage 3b: Secondary | ICD-10-CM

## 2024-04-21 ENCOUNTER — Encounter: Payer: Self-pay | Admitting: Physical Medicine & Rehabilitation

## 2024-04-21 ENCOUNTER — Encounter: Attending: Physical Medicine & Rehabilitation | Admitting: Physical Medicine & Rehabilitation

## 2024-04-21 ENCOUNTER — Other Ambulatory Visit: Payer: Self-pay | Admitting: Internal Medicine

## 2024-04-21 VITALS — BP 134/73 | HR 77 | Ht 70.0 in | Wt 192.0 lb

## 2024-04-21 DIAGNOSIS — M5416 Radiculopathy, lumbar region: Secondary | ICD-10-CM | POA: Insufficient documentation

## 2024-04-21 NOTE — Progress Notes (Signed)
 Subjective:    Patient ID: Brandon Lewis, male    DOB: 1934-07-29, 89 y.o.   MRN: [id number]  HPI CC:  Right sided low back pain 89 year old male with history of lumbar spondylosis previously seen at this clinic but last visit was in March 2021.  He has undergone lumbar medial branch blocks x 2 in 2020 and these produced a good but temporary relief of his low back pain. 08/18/2019 RightL5 dorsal ramus., Right L4 and Right L3 medial branch radio frequency neurotomy under fluoroscopic guidance  The patient had several years of 50% or better pain reduction following this procedure. Right sided back pain started hurting more 1 year ago and has been constant for the last 3-4 months Pain may radiate to RIght knee occassionally but never below the Right knee .  Pain primarily Low lumbar   Pain Inventory Average Pain 7 Pain Right Now 3 My pain is sharp and stabbing  In the last 24 hours, has pain interfered with the following? General activity 2 Relation with others 0 Enjoyment of life 0 What TIME of day is your pain at its worst? morning , daytime, and evening Sleep (in general) Good  Pain is worse with: walking, bending, standing, and some activites Pain improves with: rest and medication Relief from Meds: 2  how many minutes can you walk? 3-5 ability to climb steps?  yes do you drive?  yes  retired  trouble walking spasms  Any changes since last visit?  no  Any changes since last visit?  no    Family History  Problem Relation Age of Onset   Diabetes Father        DM - and brother    Stroke Father    Coronary artery disease Paternal Grandfather    Colon cancer Neg Hx    Prostate cancer Neg Hx    Social History   Socioeconomic History   Marital status: Married    Spouse name: Not on file   Number of children: 3   Years of education: Not on file   Highest education level: Professional school degree (e.g., MD, DDS, DVM, JD)  Occupational History    Occupation: Merchant navy officer    Comment: Retired  Tobacco Use   Smoking status: Former    Current packs/day: 0.00    Average packs/day: 1 pack/day for 20.0 years (20.0 ttl pk-yrs)    Types: Cigarettes    Start date: 10/20/1972    Quit date: 10/20/1992    Years since quitting: 31.5    Passive exposure: Never   Smokeless tobacco: Never   Tobacco comments:    quit in 1994   Vaping Use   Vaping status: Never Used  Substance and Sexual Activity   Alcohol use: Yes    Comment: rare   Drug use: No   Sexual activity: Not on file  Other Topics Concern   Not on file  Social History Narrative   Widowed; then remarried; 3 sons.      Has a living will    Son [name] should be health care POA---with wife's input   Would want resuscitation attempts   Would accept brief trial of artificial nutrition      Pt signed designated party release form and gives Bridget Westbrooks [phone] (home #), access to medical records. Can also leave msg on home answering machine. Cell # [phone]   Social Drivers of Health   Financial Resource Strain: Low Risk  (04/07/2024)   Overall Financial  Resource Strain (CARDIA)    Difficulty of Paying Living Expenses: Not hard at all  Food Insecurity: No Food Insecurity (04/07/2024)   Hunger Vital Sign    Worried About Running Out of Food in the Last Year: Never true    Ran Out of Food in the Last Year: Never true  Transportation Needs: No Transportation Needs (04/07/2024)   PRAPARE - Administrator, Civil Service (Medical): No    Lack of Transportation (Non-Medical): No  Physical Activity: Insufficiently Active (04/07/2024)   Exercise Vital Sign    Days of Exercise per Week: 3 days    Minutes of Exercise per Session: 30 min  Stress: No Stress Concern Present (04/07/2024)   [employer] of Occupational Health - Occupational Stress Questionnaire    Feeling of Stress: Not at all  Social Connections: Socially Integrated (04/07/2024)   Social Connection  and Isolation Panel    Frequency of Communication with Friends and Family: More than three times a week    Frequency of Social Gatherings with Friends and Family: Twice a week    Attends Religious Services: 1 to 4 times per year    Active Member of Clubs or Organizations: Yes    Attends Banker Meetings: 1 to 4 times per year    Marital Status: Married   Past Surgical History:  Procedure Laterality Date   aorto-bifem  2003   [facility]    CAROTID STENT     [facility]   CATARACT EXTRACTION, BILATERAL  2017   CHOLECYSTECTOMY  2002   COLONOSCOPY     COLONOSCOPY WITH PROPOFOL  N/A 07/17/2021   Procedure: COLONOSCOPY WITH PROPOFOL ;  Surgeon: Therisa Bi, MD;  Location: [facility] ENDOSCOPY;  Service: Gastroenterology;  Laterality: N/A;   CORONARY ARTERY BYPASS GRAFT N/A 09/16/2019   Procedure: CORONARY ARTERY BYPASS GRAFTING (CABG) using LIMA to LAD; Endoscopic right saphenous vein harvest to OM1, Diag1, and RCA.;  Surgeon: German Bartlett PEDLAR, MD;  Location: [facility] OR;  Service: Open Heart Surgery;  Laterality: N/A;   CYSTOSCOPY WITH BIOPSY N/A 10/08/2021   Procedure: CYSTOSCOPY WITH BLADDER BIOPSY;  Surgeon: Twylla Glendia BROCKS, MD;  Location: [facility] ORS;  Service: Urology;  Laterality: N/A;   ENDARTERECTOMY FEMORAL Left 03/13/2021   Procedure: ENDARTERECTOMY FEMORAL (GROIN REVISION);  Surgeon: Jama Cordella MATSU, MD;  Location: [facility] ORS;  Service: Vascular;  Laterality: Left;   LEFT HEART CATH AND CORONARY ANGIOGRAPHY N/A 09/12/2019   Procedure: LEFT HEART CATH AND CORONARY ANGIOGRAPHY;  Surgeon: Hester Wolm PARAS, MD;  Location: [facility] INVASIVE CV LAB;  Service: Cardiovascular;  Laterality: N/A;   LITHOTRIPSY     LOWER EXTREMITY ANGIOGRAPHY Left 01/15/2021   Procedure: LOWER EXTREMITY ANGIOGRAPHY;  Surgeon: Jama Cordella MATSU, MD;  Location: [facility] INVASIVE CV LAB;  Service: Cardiovascular;  Laterality: Left;   TEE WITHOUT CARDIOVERSION N/A 09/16/2019   Procedure: TRANSESOPHAGEAL ECHOCARDIOGRAM (TEE);   Surgeon: German Bartlett PEDLAR, MD;  Location: [facility] OR;  Service: Open Heart Surgery;  Laterality: N/A;   TONSILLECTOMY     TRANSURETHRAL RESECTION OF BLADDER TUMOR  10/08/2021   Procedure: TRANSURETHRAL RESECTION OF BLADDER TUMOR (TURBT);  Surgeon: Twylla Glendia BROCKS, MD;  Location: [facility] ORS;  Service: Urology;;   TRANSURETHRAL RESECTION OF BLADDER TUMOR WITH MITOMYCIN -C N/A 07/24/2020   Procedure: TRANSURETHRAL RESECTION OF BLADDER TUMOR WITH Gemcitabine ;  Surgeon: Twylla Glendia BROCKS, MD;  Location: [facility] ORS;  Service: Urology;  Laterality: N/A;   WISDOM TOOTH EXTRACTION     Past Medical History:  Diagnosis Date  Anemia    Aortic atherosclerosis (HCC)    Atrial flutter, paroxysmal (HCC)    a.) postoperatively following CABG   Bladder cancer (HCC) 04/2020   BPH (benign prostatic hyperplasia)    CAD (coronary artery disease) 09/12/2019   a.) LHC 09/12/2019: EF 55-60%; 85% m-dLM, 50% pLCx, 85% p-m LAD, 70% pLAD, 45% mLAD, 70% p-mRCA. b.) 4v CABG 09/16/2019.   CKD (chronic kidney disease), stage III (HCC)    Diverticulosis    GERD (gastroesophageal reflux disease)    History of kidney stones    HTN (hypertension)    Hyperlipidemia    Long term current use of antithrombotics/antiplatelets    a.) DAPT therapies (ASA + clopidogrel )   NSTEMI (non-ST elevated myocardial infarction) (HCC) 09/11/2019   a.) LHC 09/12/2019: 85% m-dLM, 50% pLCx, 85% p-m LAD, 70% pLAD, 45% mLAD, 70% p-mRCA; [facility]. Underwent 4v CABG on 09/16/2019.   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   a.) 4v CABG: LIMA-LAD, SVG-RCA, SVG-OM1, SVG-D1   Small bowel obstruction (HCC)    after surgeries   Thoracic ascending aortic aneurysm (HCC)    a.) measured 4.1 cm by CT on  09/14/2019   BP 134/73 (BP Location: Left Arm, Patient Position: Sitting, Cuff Size: Normal)   Pulse 77   Ht 5' 10 (1.778 m)   Wt 192 lb (87.1 kg)   SpO2 96%   BMI 27.55 kg/m   Opioid Risk Score:   Fall Risk Score:  `1  Depression screen [facility] 2/9     04/21/2024    1:07 PM 04/07/2024   12:27 PM 03/07/2024    2:09 PM 09/11/2023   12:29 PM 09/11/2023   11:47 AM 03/06/2023   12:59 PM 05/29/2022    2:51 PM  Depression screen PHQ 2/9  Decreased Interest 0 0 0 0 0 0 0  Down, Depressed, Hopeless 0 0 0 0 0 0 0  PHQ - 2 Score 0 0 0 0 0 0 0  Altered sleeping 0     0   Tired, decreased energy 0     0   Change in appetite 0     0   Feeling bad or failure about yourself  0     0   Trouble concentrating 0     0   Moving slowly or fidgety/restless 0     0   Suicidal thoughts 0     0   PHQ-9 Score 0     0   Difficult doing work/chores      Not difficult at all      Review of Systems  Musculoskeletal:  Positive for back pain and gait problem.       Spasms  All other systems reviewed and are negative.      Objective:   Physical Exam  General no acute distress HEENT hard of hearing Mood and affect are appropriate Lumbar spine has no evidence of deformity there is tenderness to palpation from L4-S1 region.  There is pain with right sided lateral bending as well as extension of the spine but no pain with flexion.  His lumbar flexion has normal range of motion extension is limited to about 25% of normal Extremities without edema Negative straight  leg raise Motor strength is 5/5 bilateral hip flexor knee extensor ankle dorsiflexion.      Assessment & Plan:   1.  Lumbar spondylosis without myelopathy his pain is in the identical region compared to prior he has had greater than 50% pain relief for essentially 36 months with recurrence of pain over the last year. I do not think repeat medial branch blocks are needed we could proceed to repeat right-sided L3-L4 medial branch and right L5  dorsal ramus radiofrequency neurotomy under fluoroscopic guidance.  Has had no other new symptoms to merit repeat radiologic studies at this time.

## 2024-04-27 ENCOUNTER — Ambulatory Visit
Admission: RE | Admit: 2024-04-27 | Discharge: 2024-04-27 | Disposition: A | Discharge status: Home / Self Care | Source: Ambulatory Visit | Attending: Nephrology | Admitting: Nephrology

## 2024-04-27 DIAGNOSIS — E1122 Type 2 diabetes mellitus with diabetic chronic kidney disease: Secondary | ICD-10-CM | POA: Diagnosis not present

## 2024-04-27 DIAGNOSIS — R8290 Unspecified abnormal findings in urine: Secondary | ICD-10-CM | POA: Diagnosis not present

## 2024-04-27 DIAGNOSIS — N1832 Chronic kidney disease, stage 3b: Secondary | ICD-10-CM | POA: Insufficient documentation

## 2024-05-03 ENCOUNTER — Ambulatory Visit (INDEPENDENT_AMBULATORY_CARE_PROVIDER_SITE_OTHER)

## 2024-05-03 VITALS — BP 110/76 | Ht 70.0 in | Wt 190.6 lb

## 2024-05-03 DIAGNOSIS — Z0000 Encounter for general adult medical examination without abnormal findings: Secondary | ICD-10-CM

## 2024-05-03 NOTE — Patient Instructions (Signed)
 [name] , Thank you for taking time out of your busy schedule to complete your Annual Wellness Visit with me. I enjoyed our conversation and look forward to speaking with you again next year. I, as well as your care team,  appreciate your ongoing commitment to your health goals. Please review the following plan we discussed and let me know if I can assist you in the future. Your Game plan/ To Do List    Follow up Visits: Next Medicare AWV with our clinical staff: 05/04/25 @ 2:20pm in person   Have you seen your provider in the last 6 months (3 months if uncontrolled diabetes)? Yes Next Office Visit with your provider:  Pt will schedule  Clinician Recommendations:  Aim for 30 minutes of exercise or brisk walking, 6-8 glasses of water , and 5 servings of fruits and vegetables each day.      This is a list of the screening recommended for you and due dates:  Health Maintenance  Topic Date Due   Eye exam for diabetics  Never done   DTaP/Tdap/Td vaccine (2 - Tdap) 09/21/2017   COVID-19 Vaccine (7 - Pfizer risk 2024-25 season) 01/20/2024   Flu Shot  05/20/2024   Hemoglobin A1C  09/07/2024   Complete foot exam   09/10/2024   Medicare Annual Wellness Visit  05/03/2025   Colon Cancer Screening  07/17/2026   Pneumococcal Vaccine for age over 81  Completed   Zoster (Shingles) Vaccine  Completed   Hepatitis B Vaccine  Aged Out   HPV Vaccine  Aged Out   Meningitis B Vaccine  Aged Out    Advanced directives: (Copy Requested) Please bring a copy of your health care power of attorney and living will to the office to be added to your chart at your convenience. You can mail to [facility] [address] [place], [place] [address] or email to [email or web address] Advance Care Planning is important because it:  [x]  Makes sure you receive the medical care that is consistent with your values, goals, and preferences  [x]  It provides guidance to your family and loved ones and  reduces their decisional burden about whether or not they are making the right decisions based on your wishes.  Follow the link provided in your after visit summary or read over the paperwork we have mailed to you to help you started getting your Advance Directives in place. If you need assistance in completing these, please reach out to us  so that we can help you!

## 2024-05-03 NOTE — Progress Notes (Signed)
 Please attest and cosign this visit due to patients primary care provider not being in the office at the time the visit was completed.    Subjective:   Brandon Lewis is a 89 y.o. who presents for a Medicare Wellness preventive visit.  As a reminder, Annual Wellness Visits don't include a physical exam, and some assessments may be limited, especially if this visit is performed virtually. We may recommend an in-person follow-up visit with your provider if needed.  Visit Complete: In person  Persons Participating in Visit: Patient.  AWV Questionnaire: Yes: Patient Medicare AWV questionnaire was completed by the patient on 05/02/24; I have confirmed that all information answered by patient is correct and no changes since this date.  Cardiac Risk Factors include: advanced age (>45men, >27 women);hypertension;diabetes mellitus;male gender     Objective:    Today's Vitals   05/03/24 1425  BP: 110/76  Weight: 190 lb 9.6 oz (86.5 kg)  Height: 5' 10 (1.778 m)   Body mass index is 27.35 kg/m.     05/03/2024    4:26 PM 11/18/2021    5:35 PM 10/08/2021    7:10 AM 10/02/2021    2:43 PM 07/17/2021   11:39 AM 03/25/2021    1:47 PM 03/13/2021    6:22 AM  Advanced Directives  Does Patient Have a Medical Advance Directive? Yes Yes Yes Yes Yes No No  Type of Estate agent of [place];Living will Healthcare Power of [place];Living will   Healthcare Power of [place];Living will    Does patient want to make changes to medical advance directive?   No - Patient declined      Copy of Healthcare Power of Attorney in Chart? No - copy requested    Yes - validated most recent copy scanned in chart (See row information)    Would patient like information on creating a medical advance directive?       No - Patient declined    Current Medications (verified) Outpatient Encounter Medications as of 05/03/2024  Medication Sig   aspirin  EC 81 MG tablet Take 81 mg by mouth in the  morning. Swallow whole.   calcium  carbonate (TUMS - DOSED IN MG ELEMENTAL CALCIUM ) 500 MG chewable tablet Chew 1,000 mg by mouth daily as needed for indigestion or heartburn.   Cholecalciferol  (VITAMIN D ) 50 MCG (2000 UT) CAPS Take 2,000 Units by mouth in the morning.   clopidogrel  (PLAVIX ) 75 MG tablet *VIAL ONLY* TAKE ONE TABLET BY MOUTH EVERY DAY   fluticasone  (FLONASE ) 50 MCG/ACT nasal spray Place 2 sprays into both nostrils daily.   furosemide  (LASIX ) 40 MG tablet Take 40 mg by mouth.   hydrocortisone  (ANUSOL -HC) 2.5 % rectal cream Place 1 application rectally daily as needed for hemorrhoids or anal itching.   MAGNESIUM  GLYCINATE PO Take 500 mg by mouth every evening.   metFORMIN  (GLUCOPHAGE -XR) 500 MG 24 hr tablet * VIAL* TAKE 2 TABLETS (1000MG ) BY MOUTH ONCE DAILY WITH BREAKFAST *TAKE WITH FOOD* *DO NOT CRUSH OR CHEW* *BOTTLE*   metoprolol  succinate (TOPROL -XL) 50 MG 24 hr tablet **VIAL ONLY* TAKE (1) TABLET BY MOUTH ONCE A DAY.DO NOT CRUSH. (HIGHBLOOD PRESSURE)  *DO NOT CRUSH OR CHEW*   metroNIDAZOLE  (METROGEL ) 0.75 % gel APPLY 1 APPLICATION TOPICALLY TWICE DAILY   Multiple Vitamin (MULTIVITAMIN WITH MINERALS) TABS tablet Take 1 tablet by mouth daily. Centrum Silver   NEXLIZET 180-10 MG TABS Take 1 tablet by mouth daily.   pantoprazole  (PROTONIX ) 40 MG tablet Take 1  tablet (40 mg total) by mouth daily as needed (indigestion/heartburn).   sodium chloride  (OCEAN) 0.65 % nasal spray Place 1 spray into the nose daily as needed (sinus congestion).   tamsulosin  (FLOMAX ) 0.4 MG CAPS capsule TAKE 1 CAPSULE BY MOUTH EVERY DAY   TURMERIC CURCUMIN PO Take 2 tablets by mouth daily.   No facility-administered encounter medications on file as of 05/03/2024.    Allergies (verified) Cilostazol and Simvastatin   History: Past Medical History:  Diagnosis Date   Allergy side effects- NKDA   Anemia    Aortic atherosclerosis (HCC)    Atrial flutter, paroxysmal (HCC)    a.) postoperatively following  CABG   Bladder cancer (HCC) 04/2020   BPH (benign prostatic hyperplasia)    CAD (coronary artery disease) 09/12/2019   a.) LHC 09/12/2019: EF 55-60%; 85% m-dLM, 50% pLCx, 85% p-m LAD, 70% pLAD, 45% mLAD, 70% p-mRCA. b.) 4v CABG 09/16/2019.   CKD (chronic kidney disease), stage III (HCC)    Diabetes mellitus without complication (HCC)    Diverticulosis    GERD (gastroesophageal reflux disease)    History of kidney stones    HTN (hypertension)    Hyperlipidemia    Long term current use of antithrombotics/antiplatelets    a.) DAPT therapies (ASA + clopidogrel )   NSTEMI (non-ST elevated myocardial infarction) (HCC) 09/11/2019   a.) LHC 09/12/2019: 85% m-dLM, 50% pLCx, 85% p-m LAD, 70% pLAD, 45% mLAD, 70% p-mRCA; transferred to [facility] for CVTS consult. Underwent 4v CABG on 09/16/2019.   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   a.) 4v CABG: LIMA-LAD, SVG-RCA, SVG-OM1, SVG-D1   Small bowel obstruction (HCC)    after surgeries   Thoracic ascending aortic aneurysm (HCC)    a.) measured 4.1 cm by CT on 09/14/2019   Past Surgical History:  Procedure Laterality Date   aorto-bifem  2003   [facility]    APPENDECTOMY     CAROTID STENT     [facility]   CATARACT EXTRACTION, BILATERAL  2017   CHOLECYSTECTOMY  2002   COLONOSCOPY     COLONOSCOPY WITH PROPOFOL  N/A 07/17/2021   Procedure: COLONOSCOPY WITH PROPOFOL ;  Surgeon: Therisa Bi, MD;  Location: [facility] ENDOSCOPY;  Service: Gastroenterology;  Laterality: N/A;   CORONARY ARTERY BYPASS GRAFT N/A 09/16/2019   Procedure: CORONARY ARTERY BYPASS GRAFTING (CABG) using LIMA to LAD; Endoscopic right saphenous vein harvest to OM1, Diag1, and RCA.;  Surgeon: German Bartlett PEDLAR, MD;  Location: [facility] OR;  Service: Open Heart Surgery;   Laterality: N/A;   CYSTOSCOPY WITH BIOPSY N/A 10/08/2021   Procedure: CYSTOSCOPY WITH BLADDER BIOPSY;  Surgeon: Twylla Glendia BROCKS, MD;  Location: [facility] ORS;  Service: Urology;  Laterality: N/A;   ENDARTERECTOMY FEMORAL Left 03/13/2021   Procedure: ENDARTERECTOMY FEMORAL (GROIN REVISION);  Surgeon: Jama Cordella MATSU, MD;  Location: [facility] ORS;  Service: Vascular;  Laterality: Left;   EYE SURGERY  Cataracts removed ou   LEFT HEART CATH AND CORONARY ANGIOGRAPHY N/A 09/12/2019   Procedure: LEFT HEART CATH AND CORONARY ANGIOGRAPHY;  Surgeon: Hester Wolm PARAS, MD;  Location: [facility] INVASIVE CV LAB;  Service: Cardiovascular;  Laterality: N/A;   LITHOTRIPSY     LOWER EXTREMITY ANGIOGRAPHY Left 01/15/2021   Procedure: LOWER EXTREMITY ANGIOGRAPHY;  Surgeon: Jama Cordella MATSU, MD;  Location: [facility] INVASIVE CV LAB;  Service: Cardiovascular;  Laterality: Left;   SPINE SURGERY  1954   TEE WITHOUT CARDIOVERSION N/A 09/16/2019   Procedure: TRANSESOPHAGEAL ECHOCARDIOGRAM (TEE);  Surgeon: German Bartlett PEDLAR, MD;  Location: [facility] OR;  Service: Open Heart Surgery;  Laterality: N/A;   TONSILLECTOMY     TRANSURETHRAL RESECTION OF BLADDER TUMOR  10/08/2021   Procedure: TRANSURETHRAL RESECTION OF BLADDER TUMOR (TURBT);  Surgeon: Twylla Glendia BROCKS, MD;  Location: [facility] ORS;  Service: Urology;;   TRANSURETHRAL RESECTION OF BLADDER TUMOR WITH MITOMYCIN -C N/A 07/24/2020   Procedure: TRANSURETHRAL RESECTION OF BLADDER TUMOR WITH Gemcitabine ;  Surgeon: Twylla Glendia BROCKS, MD;  Location: [facility] ORS;  Service: Urology;  Laterality: N/A;   VASECTOMY  1976   WISDOM TOOTH EXTRACTION     Family History  Problem Relation Age of Onset   Diabetes Father        DM - and brother    Stroke Father    Cancer Mother    Coronary artery disease Paternal Grandfather    Colon cancer Neg Hx    Prostate cancer Neg Hx    Social History   Socioeconomic History   Marital status: Married    Spouse name: Not on file   Number of children: 3   Years  of education: Not on file   Highest education level: Professional school degree (e.g., MD, DDS, DVM, JD)  Occupational History   Occupation: Merchant navy officer    Comment: Retired  Tobacco Use   Smoking status: Former    Current packs/day: 0.00    Average packs/day: 1 pack/day for 20.0 years (20.0 ttl pk-yrs)    Types: Cigarettes    Start date: 10/20/1972    Quit date: 10/20/1992    Years since quitting: 31.5    Passive exposure: Never   Smokeless tobacco: Never   Tobacco comments:    quit in 1994   Vaping Use   Vaping status: Never Used  Substance and Sexual Activity   Alcohol use: Yes    Alcohol/week: 1.0 standard drink of alcohol    Types: 1 Standard drinks or equivalent per week    Comment: Occasionally at social functions   Drug use: Never   Sexual activity: Not Currently  Other Topics Concern   Not on file  Social History Narrative   Widowed; then remarried; 3 sons.      Has a living will    Son [name] should be health care POA---with wife's input   Would want resuscitation attempts   Would accept brief trial of artificial nutrition      Pt signed designated party release form and gives Mack Alvidrez [phone] (home #), access to medical records. Can also leave msg on home answering machine. Cell # [phone]   Social Drivers of Health   Financial Resource Strain: Low Risk  (05/03/2024)   Overall Financial Resource Strain (CARDIA)    Difficulty of Paying Living Expenses: Not hard at all  Food Insecurity: No Food Insecurity (05/03/2024)   Hunger Vital Sign    Worried About Running Out of Food in the Last Year: Never true    Ran Out of Food in the Last Year: Never true  Transportation Needs: No Transportation Needs (05/03/2024)   PRAPARE - Administrator, Civil Service (Medical): No    Lack of Transportation (Non-Medical): No  Physical Activity: Insufficiently Active (05/03/2024)   Exercise Vital Sign    Days of Exercise per Week: 3 days  Minutes of  Exercise per Session: 30 min  Stress: No Stress Concern Present (05/03/2024)   [employer] of Occupational Health - Occupational Stress Questionnaire    Feeling of Stress: Not at all  Social Connections: Moderately Isolated (05/03/2024)   Social Connection and Isolation Panel    Frequency of Communication with Friends and Family: More than three times a week    Frequency of Social Gatherings with Friends and Family: Once a week    Attends Religious Services: Never    Database administrator or Organizations: No    Attends Engineer, structural: Never    Marital Status: Married    Tobacco Counseling Counseling given: Not Answered Tobacco comments: quit in 1994     Clinical Intake:  Pre-visit preparation completed: Yes  Pain : No/denies pain     BMI - recorded: 27.35 Nutritional Status: BMI 25 -29 Overweight Nutritional Risks: None Diabetes: Yes CBG done?: No Did pt. bring in CBG monitor from home?: No  Lab Results  Component Value Date   HGBA1C 5.9 (A) 03/07/2024   HGBA1C 6.4 09/11/2023   HGBA1C 5.5 03/06/2023     How often do you need to have someone help you when you read instructions, pamphlets, or other written materials from your doctor or pharmacy?: 1 - Never  Interpreter Needed?: No  Comments: lives with wife Information entered by :: B.Guillermo Nehring,LPN   Activities of Daily Living     05/02/2024    8:03 PM  In your present state of health, do you have any difficulty performing the following activities:  Hearing? 1  Vision? 0  Difficulty concentrating or making decisions? 0  Walking or climbing stairs? 0  Dressing or bathing? 0  Doing errands, shopping? 0  Preparing Food and eating ? N  Using the Toilet? N  In the past six months, have you accidently leaked urine? N  Do you have problems with loss of bowel control? N  Managing your Medications? N  Managing your Finances? N  Housekeeping or managing your Housekeeping? N    Patient Care  Team: Jimmy Charlie FERNS, MD as PCP - General Hester Wolm PARAS, MD as PCP - Cardiology (Cardiology) Carolee Manus DASEN., MD (Ophthalmology)  I have updated your Care Teams any recent Medical Services you may have received from other providers in the past year.     Assessment:   This is a routine wellness examination for Emidio.  Hearing/Vision screen Hearing Screening - Comments:: Pt says cannot hear without hearing aids Vision Screening - Comments:: Pt says his vision is good Dr Carolee   Goals Addressed             This Visit's Progress    Patient Stated       I am working on controlling sweets and eating healthier       Depression Screen     05/03/2024    2:22 PM 04/21/2024    1:07 PM 04/07/2024   12:27 PM 03/07/2024    2:09 PM 09/11/2023   12:29 PM 09/11/2023   11:47 AM 03/06/2023   12:59 PM  PHQ 2/9 Scores  PHQ - 2 Score 0 0 0 0 0 0 0  PHQ- 9 Score  0     0    Fall Risk     05/03/2024    4:22 PM 05/02/2024    8:03 PM 04/21/2024    1:07 PM 04/07/2024   12:27 PM 03/07/2024    2:09 PM  Fall  Risk   Falls in the past year? 0 0 0 0 0  Number falls in past yr: 0   0 0  Injury with Fall? 0   0 0  Risk for fall due to : No Fall Risks   No Fall Risks No Fall Risks  Follow up Falls prevention discussed;Education provided   Falls evaluation completed Falls evaluation completed    MEDICARE RISK AT HOME:  Medicare Risk at Home Any stairs in or around the home?: (Patient-Rptd) Yes If so, are there any without handrails?: (Patient-Rptd) No Home free of loose throw rugs in walkways, pet beds, electrical cords, etc?: (Patient-Rptd) Yes Adequate lighting in your home to reduce risk of falls?: (Patient-Rptd) Yes Life alert?: (Patient-Rptd) No Use of a cane, walker or w/c?: (Patient-Rptd) No Grab bars in the bathroom?: (Patient-Rptd) Yes Shower chair or bench in shower?: (Patient-Rptd) Yes Elevated toilet seat or a handicapped toilet?: (Patient-Rptd) Yes  TIMED UP AND GO:  Was  the test performed?  No pt wheeling his wife in  Cognitive Function: 6CIT completed        05/03/2024    4:26 PM  6CIT Screen  What Year? 0 points  What month? 0 points  What time? 0 points  Count back from 20 0 points  Months in reverse 0 points  Repeat phrase 0 points  Total Score 0 points    Immunizations Immunization History  Administered Date(s) Administered   Fluad Quad(high Dose 65+) 10/12/2019, 09/04/2020, 09/04/2022   Influenza Whole 08/13/2010   Influenza, High Dose Seasonal PF 09/13/2018, 08/26/2021   Influenza,inj,Quad PF,6+ Mos 07/19/2013, 07/25/2014, 08/14/2015, 08/21/2016, 08/24/2017   Influenza-Unspecified 07/22/2023   Moderna Covid-19 Fall Seasonal Vaccine 34yrs & older 07/22/2023   PFIZER Comirnaty(Gray Top)Covid-19 Tri-Sucrose Vaccine 08/07/2020   PFIZER(Purple Top)SARS-COV-2 Vaccination 10/24/2019, 11/14/2019   Pfizer Covid-19 Vaccine Bivalent Booster 50yrs & up 08/26/2021   Pfizer(Comirnaty)Fall Seasonal Vaccine 12 years and older 09/30/2022   Pneumococcal Conjugate-13 07/25/2014   Pneumococcal Polysaccharide-23 09/22/2007, 08/24/2017, 09/28/2019   Respiratory Syncytial Virus Vaccine,Recomb Aduvanted(Arexvy) 11/24/2023   Td 09/22/2007   Zoster Recombinant(Shingrix) 08/14/2023, 12/29/2023   Zoster, Live 03/01/2012    Screening Tests Health Maintenance  Topic Date Due   OPHTHALMOLOGY EXAM  Never done   DTaP/Tdap/Td (2 - Tdap) 09/21/2017   COVID-19 Vaccine (7 - Pfizer risk 2024-25 season) 01/20/2024   INFLUENZA VACCINE  05/20/2024   HEMOGLOBIN A1C  09/07/2024   FOOT EXAM  09/10/2024   Medicare Annual Wellness (AWV)  05/03/2025   Colonoscopy  07/17/2026   Pneumococcal Vaccine: 50+ Years  Completed   Zoster Vaccines- Shingrix  Completed   Hepatitis B Vaccines  Aged Out   HPV VACCINES  Aged Out   Meningococcal B Vaccine  Aged Out    Health Maintenance  Health Maintenance Due  Topic Date Due   OPHTHALMOLOGY EXAM  Never done   DTaP/Tdap/Td (2  - Tdap) 09/21/2017   COVID-19 Vaccine (7 - Pfizer risk 2024-25 season) 01/20/2024   Health Maintenance Items Addressed: None needed at this time  Additional Screening:  Vision Screening: Recommended annual ophthalmology exams for early detection of glaucoma and other disorders of the eye. Would you like a referral to an eye doctor? No    Dental Screening: Recommended annual dental exams for proper oral hygiene  Community Resource Referral / Chronic Care Management: CRR required this visit?  No   CCM required this visit?  No   Plan:    I have personally reviewed and noted the  following in the patient's chart:   Medical and social history Use of alcohol, tobacco or illicit drugs  Current medications and supplements including opioid prescriptions. Patient is not currently taking opioid prescriptions. Functional ability and status Nutritional status Physical activity Advanced directives List of other physicians Hospitalizations, surgeries, and ER visits in previous 12 months Vitals Screenings to include cognitive, depression, and falls Referrals and appointments  In addition, I have reviewed and discussed with patient certain preventive protocols, quality metrics, and best practice recommendations. A written personalized care plan for preventive services as well as general preventive health recommendations were provided to patient.   Erminio LITTIE Saris, LPN   [id number]/[id number]   After Visit Summary: (MyChart) Due to this being a telephonic visit, the after visit summary with patients personalized plan was offered to patient via MyChart   Notes: Nothing significant to report at this time.

## 2024-05-13 ENCOUNTER — Ambulatory Visit

## 2024-05-19 ENCOUNTER — Ambulatory Visit: Admitting: Physical Medicine & Rehabilitation

## 2024-05-19 DIAGNOSIS — E875 Hyperkalemia: Secondary | ICD-10-CM | POA: Diagnosis not present

## 2024-05-19 DIAGNOSIS — E785 Hyperlipidemia, unspecified: Secondary | ICD-10-CM | POA: Diagnosis not present

## 2024-05-19 DIAGNOSIS — I129 Hypertensive chronic kidney disease with stage 1 through stage 4 chronic kidney disease, or unspecified chronic kidney disease: Secondary | ICD-10-CM | POA: Diagnosis not present

## 2024-05-19 DIAGNOSIS — R809 Proteinuria, unspecified: Secondary | ICD-10-CM | POA: Diagnosis not present

## 2024-05-19 DIAGNOSIS — N1832 Chronic kidney disease, stage 3b: Secondary | ICD-10-CM | POA: Diagnosis not present

## 2024-05-19 DIAGNOSIS — E1122 Type 2 diabetes mellitus with diabetic chronic kidney disease: Secondary | ICD-10-CM | POA: Diagnosis not present

## 2024-05-19 NOTE — Progress Notes (Signed)
  PROCEDURE RECORD [facility]   Name: Brandon Lewis DOB:1934/05/10 MRN: [id number]  Date:05/19/2024  Physician: [name] Compton    Nurse/CMA: Tye Kitty MA  Allergies:  Allergies  Allergen Reactions   Cilostazol Other (See Comments)    Bowel upset   Simvastatin Other (See Comments)    myalgia    Consent Signed: Yes.    Is patient diabetic? Yes.    CBG today? 125  Pregnant: No. LMP: No LMP for male patient. (age 88-55)  Anticoagulants: Plavix  last taken yesterday morning Anti-inflammatory: no Antibiotics: no  Procedure: Right L3-4-5 Radiofrequency   Position: Prone Start Time: 2:21 pm  End Time: 2:32 pm  Fluoro Time: 36  RN/CMA Michiah Mudry MA     Time 2:08 pm 2:35 pm    BP 118/70 136/65    Pulse 81 79    Respirations 16 16    O2 Sat 98 99    S/S 6 6    Pain Level 4/10 1/10     D/C home with Son, patient A & O X 3, D/C instructions reviewed, and sits independently.      5

## 2024-05-24 ENCOUNTER — Encounter: Payer: Self-pay | Admitting: Physical Medicine & Rehabilitation

## 2024-05-24 ENCOUNTER — Encounter: Attending: Physical Medicine & Rehabilitation | Admitting: Physical Medicine & Rehabilitation

## 2024-05-24 VITALS — BP 118/70 | HR 81 | Temp 98.1°F | Ht 70.0 in | Wt 189.0 lb

## 2024-05-24 DIAGNOSIS — M47817 Spondylosis without myelopathy or radiculopathy, lumbosacral region: Secondary | ICD-10-CM | POA: Insufficient documentation

## 2024-05-24 MED ORDER — LIDOCAINE HCL (PF) 2 % IJ SOLN
3.0000 mL | Freq: Once | INTRAMUSCULAR | Status: AC
  Administered 2024-05-24: 3 mL

## 2024-05-24 MED ORDER — LIDOCAINE HCL 1 % IJ SOLN
10.0000 mL | Freq: Once | INTRAMUSCULAR | Status: AC
  Administered 2024-05-24: 10 mL

## 2024-05-24 NOTE — Progress Notes (Signed)
RightL5 dorsal ramus., Right L4 and Right L3 medial branch radio frequency neurotomy under fluoroscopic guidance   Indication: Low back pain due to lumbar spondylosis which has been relieved on 2 occasions by greater than 50% by lumbar medial branch blocks at corresponding levels.  Informed consent was obtained after describing risks and benefits of the procedure with the patient, this includes bleeding, bruising, infection, paralysis and medication side effects. The patient wishes to proceed and has given written consent. The patient was placed in a prone position. The lumbar and sacral area was marked and prepped with Betadine. A 25-gauge 1-1/2 inch needle was inserted into the skin and subcutaneous tissue at 3 sites in one ML of 1% lidocaine was injected into each site. Then a 18-gauge 10 cm radio frequency needle with a 1 cm curved active tip was inserted targeting the Right S1 SAP/sacral ala junction. Bone contact was made and confirmed with lateral imaging.  motor stimulation at 2 Hz confirm proper needle location followed by injection of 1ml 2% MPF lidocaine. Then the Right L5 SAP/transverse process junction was targeted. Bone contact was made and confirmed with lateral imaging.  motor stimulation at 2 Hz confirm proper needle location followed by injection of 1ml 2% MPF lidocaine. Then the Right L4 SAP/transverse process junction was targeted. Bone contact was made and confirmed with lateral imaging. motor stimulation at 2 Hz confirm proper needle location followed by injection of 1ml 2% MPF lidocaine. Radio frequency lesion being at 80C for 90 seconds was performed. Needles were removed. Post procedure instructions and vital signs were performed. Patient tolerated procedure well. Followup appointment was given. 

## 2024-05-24 NOTE — Patient Instructions (Signed)
You had a radio frequency procedure today This was done to alleviate joint pain in your lumbar area We injected lidocaine which is a local anesthetic.  You may experience soreness at the injection sites. You may also experienced some irritation of the nerves that were heated I'm recommending ice for 30 minutes every 2 hours as needed for the next 24-48 hours   

## 2024-06-09 DIAGNOSIS — N1832 Chronic kidney disease, stage 3b: Secondary | ICD-10-CM | POA: Diagnosis not present

## 2024-06-09 DIAGNOSIS — I129 Hypertensive chronic kidney disease with stage 1 through stage 4 chronic kidney disease, or unspecified chronic kidney disease: Secondary | ICD-10-CM | POA: Diagnosis not present

## 2024-06-09 DIAGNOSIS — R809 Proteinuria, unspecified: Secondary | ICD-10-CM | POA: Diagnosis not present

## 2024-06-09 DIAGNOSIS — E785 Hyperlipidemia, unspecified: Secondary | ICD-10-CM | POA: Diagnosis not present

## 2024-06-09 DIAGNOSIS — E875 Hyperkalemia: Secondary | ICD-10-CM | POA: Diagnosis not present

## 2024-06-09 DIAGNOSIS — E1122 Type 2 diabetes mellitus with diabetic chronic kidney disease: Secondary | ICD-10-CM | POA: Diagnosis not present

## 2024-06-16 DIAGNOSIS — R809 Proteinuria, unspecified: Secondary | ICD-10-CM | POA: Diagnosis not present

## 2024-06-16 DIAGNOSIS — E785 Hyperlipidemia, unspecified: Secondary | ICD-10-CM | POA: Diagnosis not present

## 2024-06-16 DIAGNOSIS — N1832 Chronic kidney disease, stage 3b: Secondary | ICD-10-CM | POA: Diagnosis not present

## 2024-06-16 DIAGNOSIS — E1122 Type 2 diabetes mellitus with diabetic chronic kidney disease: Secondary | ICD-10-CM | POA: Diagnosis not present

## 2024-06-16 DIAGNOSIS — I129 Hypertensive chronic kidney disease with stage 1 through stage 4 chronic kidney disease, or unspecified chronic kidney disease: Secondary | ICD-10-CM | POA: Diagnosis not present

## 2024-06-16 DIAGNOSIS — E875 Hyperkalemia: Secondary | ICD-10-CM | POA: Diagnosis not present

## 2024-06-21 DIAGNOSIS — E113393 Type 2 diabetes mellitus with moderate nonproliferative diabetic retinopathy without macular edema, bilateral: Secondary | ICD-10-CM | POA: Diagnosis not present

## 2024-07-14 NOTE — Progress Notes (Signed)
 [facility] Follow Up Visit   Patient Name: Brandon Lewis, male   Patient DOB: [birth year] Date of Service: 07/14/2024  Patient MRN: [id number] Provider Creating Note: Woodward Brought, MD  [phone] Primary Care Physician: Jimmy Charlie FERNS, MD   [address] [place] [address] Additional Physicians/ Providers:   Impression/Recommendations   Brandon Lewis is a 89 y.o. male  with coronary artery disease status post CABG, hypertension, diabetes mellitus type II, peripheral vascular disease, GERD and history of bladder cancer who presents as a follow up patient for evaluation of chronic kidney disease stage IIIB. Creatinine 2.13, GFR of 29. PCR 273mg .    Chronic Kidney Disease stage IIIB with proteinuria and hyperkalemia: with hypertension and diabetes.  - Continue losartan  25mg  daily.  - Continue dapagliflozin 5mg  daily.  - not currently on a mineralocorticoid receptor antagonist due to history of hyperkalemia.  - avoid nonsteroidal anti-inflammatory agents.    Hypertension with chronic kidney disease: 110/67 - current regimen of metoprolol , furosemide  and losartan   - home blood pressure monitoring.   Diabetes mellitus type II with chronic kidney disease: hemoglobin A1c of 5.9% on 03/07/2024. Noninsulin dependent.  - Continue metformin  - Continue dapagliflozin 5mg  as above.  - not currently on an GLP-1 agent.    Hyperlipidemia:  - currently on bempedoic acid-ezetimibe with good control.  - may continue Co-Q10  Secondary Hyperparathyroidism: PTH 91.  - Continue cholecalciferol   Anemia with chronic kidney disease: with macrocytosis and elevated serum free light chains - refer to hematology.     Patient Active Problem List  Diagnosis  . Type 2 diabetes mellitus with diabetic chronic kidney disease (HCC)  . Chronic kidney disease stage 3B (HCC)  . Hypertensive chronic kidney disease, benign, with chronic kidney disease stage I through  stage IV, or unspecified  . Hyperlipidemia  . Proteinuria  . Hyperkalemia    Orders Placed This Encounter  . PTH, Intact  . Renal Function Panel  . CBC and Differential  . Urinalysis, Complete w/reflex to Culture  . Protein, Total, Random Urine w/Creatinine (Protein/Creat Ratio)  . Ambulatory referral to Hematology / Oncology       Return in about 3 months (around 10/13/2024).  Chief Complaint   Chief Complaint  Patient presents with  . Follow-up    History of Present Illness   Brandon Lewis presents for follow up. Patient presents by himself. Patient has several questions and concerns.   Patient was started on dapagliflozin on last visit. He is tolerating this well. He states his glucose is well controlled.   Patient claims to be taking all his medications at night.   Patient denies use of nonsteroidal anti-inflammatory agents. No recent hospitalizations.   Reports no changes to his health.      Medications   Current Outpatient Medications:  .  aspirin  ([facility] JOSEPH) 81 MG EC tablet, Take 81 mg by mouth every morning, Disp: , Rfl:  .  Bempedoic Acid-Ezetimibe (Nexlizet) 180-10 MG tablet, Take 1 tablet by mouth 1 (one) time each day, Disp: , Rfl:  .  calcium  carbonate (TUMS) 500 MG chewable tablet, Chew 1,000 mg by mouth daily as needed for indigestion or heartburn., Disp: , Rfl:  .  Cholecalciferol  50 MCG (2000 UT) capsule, Take 2,000 Units by mouth every morning, Disp: , Rfl:  .  clopidogrel  (PLAVIX ) 75 MG tablet, *VIAL ONLY* TAKE ONE TABLET BY MOUTH EVERY DAY, Disp: , Rfl:  .  Dapagliflozin Propanediol (Farxiga) 5 MG  tablet, Take 5 mg by mouth 1 (one) time each day in the morning, Disp: 30 tablet, Rfl: 11 .  fluticasone  (FLONASE ) 50 MCG/ACT nasal spray, Place 2 sprays into both nostrils daily., Disp: , Rfl:  .  furosemide  (Lasix ) 40 MG tablet, Take 1 tablet (40 mg total) by mouth 1 (one) time each day, Disp: 30 tablet, Rfl: 11 .  Hydrocortisone , Perianal,  2.5 % cream, Place 1 application rectally daily as needed for hemorrhoids or anal itching., Disp: , Rfl:  .  losartan  (Cozaar ) 25 MG tablet, Take 1 tablet (25 mg total) by mouth 1 (one) time each day, Disp: 30 tablet, Rfl: 11 .  MAGNESIUM  GLYCINATE PO, Take 500 mg by mouth 1 (one) time each day in the evening, Disp: , Rfl:  .  metFORMIN  XR (GLUCOPHAGE -XR) 500 MG 24 hr tablet, * VIAL* TAKE 2 TABLETS (1000MG ) BY MOUTH ONCE DAILY WITH BREAKFAST *TAKE WITH FOOD* *DO NOT CRUSH OR CHEW* *BOTTLE*, Disp: , Rfl:  .  metoprolol  succinate XL (TOPROL  XL) 50 MG 24 hr tablet, **VIAL ONLY* TAKE (1) TABLET BY MOUTH ONCE A DAY.DO NOT CRUSH. (HIGHBLOOD PRESSURE)  *DO NOT CRUSH OR CHEW*, Disp: , Rfl:  .  metroNIDAZOLE  (METROGEL ) 0.75 % gel, APPLY 1 APPLICATION TOPICALLY TWICE DAILY, Disp: , Rfl:  .  Misc Natural Products (TURMERIC, CURCUMIN, PO), Take 2 tablets by mouth 1 (one) time each day, Disp: , Rfl:  .  Multiple Vitamins-Minerals (Multi Vitamin/Minerals) tablet, Take 1 tablet by mouth daily. Centrum Silver, Disp: , Rfl:  .  pantoprazole  (PROTONIX ) 40 MG EC tablet, Take 1 tablet (40 mg total) by mouth daily as needed (indigestion/heartburn)., Disp: , Rfl:  .  sodium chloride  (OCEAN) 0.65 % nasal spray, Place 1 spray into the nose daily as needed (sinus congestion)., Disp: , Rfl:  .  tamsulosin  (FLOMAX ) 0.4 MG 24 hr capsule, Take 0.4 mg by mouth in the morning., Disp: , Rfl:    Allergies Cilostazol and Simvastatin  History Past Medical History:  Diagnosis Date  . Allergic rhinitis   . Atrial flutter (HCC)   . Basal cell carcinoma of skin   . Benign prostatic hyperplasia   . Bladder cancer (HCC)   . Carotid artery stenosis   . Chronic kidney disease stage 3B (HCC)   . Coronary atherosclerosis of unspecified type of vessel, native or graft   . Gastroesophageal reflux disease   . Hyperkalemia 04/21/2024  . Hyperlipidemia   . Hypertensive chronic kidney disease, benign, with chronic kidney disease stage I  through stage IV, or unspecified   . Osteoarthritis   . Peripheral arterial occlusive disease (HCC)   . Proteinuria   . Thoracic aortic aneurysm without rupture (HCC)   . Type 2 diabetes mellitus with diabetic chronic kidney disease [facility])     Past Surgical History:  Procedure Laterality Date  . CAROTID STENT    . CATARACT EXTRACTION, BILATERAL    . CHOLECYSTECTOMY    . CORONARY ARTERY BYPASS GRAFT    . TRANSURETHRAL RESECTION OF BLADDER TUMOR     Family History  Problem Relation Age of Onset  . Heart disease Mother   . Cancer Mother   . Heart disease Father   . Hypertension Father   . Diabetes Father   . Kidney disease Brother        Dialysis   Social History   Tobacco Use  . Smoking status: Never  . Smokeless tobacco: Never  Substance Use Topics  . Alcohol use: Yes  Comment: socially     Physical Exam  Vitals BP 110/69 (BP Location: Left upper arm, Patient Position: Standing)   Pulse 90   Temp 98.3 F   Wt 183 lb 3.2 oz (83.1 kg)   SpO2 99%   BMI 26.29 kg/m   Vitals reviewed. Constitutional: He is oriented to person, place, and time. He appears well-developed.  HEENT:  Head: Normocephalic and atraumatic. Mouth/Throat: Oropharynx is clear and moist.  Eyes: Pupils are equal, round, and reactive to light.  Neck: Neck supple.  Cardiovascular:  Normal rate and regular rhythm.           Pulmonary/Chest: Effort normal and breath sounds normal.  Abdominal: Soft.  Neurological: He is alert and oriented to person, place, and time.  Skin: Skin is warm and dry.     Laboratory Studies  Chemistry  Lab Units 07/07/24 0835 06/09/24 1114 04/28/24 1116 04/18/24 1442  SODIUM mmol/L 139 139 138 139  POTASSIUM mmol/L 4.9 5.2 4.5 6.1*  CHLORIDE mmol/L 107 107 102 106  CO2 mmol/L 24 23 26 23   MAGNESIUM  mg/dL  --   --   --  2.0  CALCIUM  mg/dL 9.7 9.4 9.9 9.9  PHOSPHORUS mg/dL 3.9 3.3  --  3.0  PTH pg/mL 91*  --   --  43  GLUCOSE mg/dL 852* 851* 827* 892*  ALBUMIN   g/dL 4.0 4.0  --  3.9  4.1  BUN mg/dL 43* 45* 31* 27*  CREATININE mg/dL 7.86* 7.93* 8.02* 8.25*        No lab exists for component: IRON  SATURATION, TRANSSATPER  CBC  Lab Units 07/07/24 0835 04/18/24 1442  WBC AUTO Thousand/uL 9.8 7.6  HEMOGLOBIN g/dL 9.2* 89.9*  HEMOGLOBIN URINE  NEGATIVE  --   HEMATOCRIT % 28.6* 31.8*  MCV fL 121.7* 120.9*  PLATELETS AUTO Thousand/uL 246 143    Urine  Lab Units 07/07/24 0835 04/18/24 1442 04/18/24 1353  COLOR U  YELLOW  --   --   COLOR UA   --   --  Yellow  CLARITY UA   --   --  Clear  KETONES U MG/DL  NEGATIVE  --   --   KETONES UA   --   --  Negative  PH UA   --   --  5.5  UROBILINOGEN UA   --   --  0.2  PROT/CREAT RATIO UR mg/g creat 0.273*  273* 0.421*  421*  --         No lab exists for component: CYCLOSPORITR     Woodward Brought, MD  [employer], [place]

## 2024-07-18 NOTE — Progress Notes (Unsigned)
 Urologic history: 1.  Ta urothelial carcinoma bladder, high-grade TURBT 07/24/2020; ~ 2 cm right posterior wall tumor Induction BCG completed 11/09/2020 which was tolerated well Maintenance BCG (received only 1 dose May 2022 and doses 2/22 May 2021 due to shortage TURBT 10/08/2021 for abnormal mucosa early papillary change just inside the left bladder neck; Path high-grade Ta Reinduction BCG x6 completed 01/10/2022 Maintenance BCG x3 completed: 04/15/2022, 11/08/2022, 05/26/2023, 12/24/2023 Surveillance cystoscopy (02/2024) NED     BCG Bladder Instillation  BCG # 1/3  Due to Bladder Cancer patient is present today for a BCG treatment. Patient was cleaned and prepped in a sterile fashion with betadine. A 14 FR catheter was inserted, urine return was noted 100 ml, urine was yellow clear in color.  50ml of reconstituted BCG was instilled into the bladder. The catheter was then removed. Patient tolerated well, no complications were noted  Performed by: CLOTILDA CORNWALL, PA-C and Nya E Bynum, CMA    Follow up/ Additional notes: Follow-up in 1 week for number 2 out of 3 BCG

## 2024-07-19 ENCOUNTER — Ambulatory Visit: Admitting: Urology

## 2024-07-19 VITALS — BP 107/62 | HR 81 | Ht 70.0 in | Wt 175.0 lb

## 2024-07-19 DIAGNOSIS — C679 Malignant neoplasm of bladder, unspecified: Secondary | ICD-10-CM | POA: Diagnosis not present

## 2024-07-19 LAB — MICROSCOPIC EXAMINATION: Epithelial Cells (non renal): >10 /HPF — AB (ref 0–10)

## 2024-07-19 LAB — URINALYSIS, COMPLETE
Bilirubin, UA: NEGATIVE
Glucose, UA: NEGATIVE
Ketones, UA: NEGATIVE
Leukocytes,UA: NEGATIVE
Nitrite, UA: NEGATIVE
RBC, UA: NEGATIVE
Specific Gravity, UA: 1.015 (ref 1.005–1.030)
Urobilinogen, Ur: 0.2 mg/dL (ref 0.2–1.0)
pH, UA: 6 (ref 5.0–7.5)

## 2024-07-19 MED ORDER — BCG LIVE 50 MG IS SUSR
3.2400 mL | Freq: Once | INTRAVESICAL | Status: AC
  Administered 2024-07-19: 81 mg via INTRAVESICAL

## 2024-07-25 ENCOUNTER — Telehealth: Payer: Self-pay | Admitting: Physical Medicine & Rehabilitation

## 2024-07-25 NOTE — Telephone Encounter (Signed)
 This pt wants to know when will he be due to have his next procedure? He stated he wanted to make the appt the first day he is able to

## 2024-07-25 NOTE — Progress Notes (Unsigned)
 BCG Bladder Instillation  BCG # 2/3  Due to Bladder Cancer patient is present today for a BCG treatment. Patient was cleaned and prepped in a sterile fashion with betadine. A 14 FR catheter was inserted, urine return was noted 50 ml, urine was yellow in color.  50ml of reconstituted BCG was instilled into the bladder. The catheter was then removed. Patient tolerated well, no complications were noted  Performed by: CLOTILDA CORNWALL, PA-C and Nya E Bynum, CMA    Follow up/ Additional notes: Follow up in one week for #3/3 BCG

## 2024-07-26 ENCOUNTER — Encounter: Payer: Self-pay | Admitting: Urology

## 2024-07-26 ENCOUNTER — Ambulatory Visit: Admitting: Urology

## 2024-07-26 VITALS — BP 105/62 | HR 76 | Ht 70.0 in | Wt 175.0 lb

## 2024-07-26 DIAGNOSIS — C679 Malignant neoplasm of bladder, unspecified: Secondary | ICD-10-CM

## 2024-07-26 LAB — MICROSCOPIC EXAMINATION

## 2024-07-26 LAB — URINALYSIS, COMPLETE
Bilirubin, UA: NEGATIVE
Glucose, UA: NEGATIVE
Ketones, UA: NEGATIVE
Nitrite, UA: NEGATIVE
RBC, UA: NEGATIVE
Specific Gravity, UA: 1.02 (ref 1.005–1.030)
Urobilinogen, Ur: 0.2 mg/dL (ref 0.2–1.0)
pH, UA: 6 (ref 5.0–7.5)

## 2024-07-26 MED ORDER — BCG LIVE 50 MG IS SUSR
3.2400 mL | Freq: Once | INTRAVESICAL | Status: AC
  Administered 2024-07-26: 81 mg via INTRAVESICAL

## 2024-07-28 ENCOUNTER — Inpatient Hospital Stay: Attending: Oncology | Admitting: Oncology

## 2024-07-28 ENCOUNTER — Inpatient Hospital Stay

## 2024-07-28 ENCOUNTER — Encounter: Payer: Self-pay | Admitting: Oncology

## 2024-07-28 VITALS — BP 117/54 | HR 71 | Temp 98.9°F | Resp 18 | Ht 70.0 in | Wt 181.0 lb

## 2024-07-28 DIAGNOSIS — D539 Nutritional anemia, unspecified: Secondary | ICD-10-CM | POA: Diagnosis not present

## 2024-07-28 DIAGNOSIS — R7689 Other specified abnormal immunological findings in serum: Secondary | ICD-10-CM | POA: Diagnosis present

## 2024-07-28 DIAGNOSIS — N183 Chronic kidney disease, stage 3 unspecified: Secondary | ICD-10-CM | POA: Insufficient documentation

## 2024-07-28 DIAGNOSIS — Z807 Family history of other malignant neoplasms of lymphoid, hematopoietic and related tissues: Secondary | ICD-10-CM | POA: Diagnosis not present

## 2024-07-28 DIAGNOSIS — Z87891 Personal history of nicotine dependence: Secondary | ICD-10-CM | POA: Insufficient documentation

## 2024-07-28 LAB — BASIC METABOLIC PANEL WITH GFR
Anion gap: 9 (ref 5–15)
BUN: 52 mg/dL — ABNORMAL HIGH (ref 8–23)
CO2: 23 mmol/L (ref 22–32)
Calcium: 9 mg/dL (ref 8.9–10.3)
Chloride: 103 mmol/L (ref 98–111)
Creatinine, Ser: 2.23 mg/dL — ABNORMAL HIGH (ref 0.61–1.24)
GFR, Estimated: 28 mL/min — ABNORMAL LOW (ref >60)
Glucose, Bld: 146 mg/dL — ABNORMAL HIGH (ref 70–99)
Potassium: 4.4 mmol/L (ref 3.5–5.1)
Sodium: 135 mmol/L (ref 135–145)

## 2024-07-28 LAB — CBC WITH DIFFERENTIAL/PLATELET
Abs Immature Granulocytes: 0.06 K/uL (ref 0.00–0.07)
Basophils Absolute: 0 K/uL (ref 0.0–0.1)
Basophils Relative: 0 %
Eosinophils Absolute: 0.1 K/uL (ref 0.0–0.5)
Eosinophils Relative: 1 %
HCT: 28 % — ABNORMAL LOW (ref 39.0–52.0)
Hemoglobin: 9 g/dL — ABNORMAL LOW (ref 13.0–17.0)
Immature Granulocytes: 1 %
Lymphocytes Relative: 22 %
Lymphs Abs: 2 K/uL (ref 0.7–4.0)
MCH: 40.2 pg — ABNORMAL HIGH (ref 26.0–34.0)
MCHC: 32.1 g/dL (ref 30.0–36.0)
MCV: 125 fL — ABNORMAL HIGH (ref 80.0–100.0)
Monocytes Absolute: 0.8 K/uL (ref 0.1–1.0)
Monocytes Relative: 9 %
Neutro Abs: 6.3 K/uL (ref 1.7–7.7)
Neutrophils Relative %: 67 %
Platelets: 157 K/uL (ref 150–400)
RBC: 2.24 MIL/uL — ABNORMAL LOW (ref 4.22–5.81)
RDW: 17.2 % — ABNORMAL HIGH (ref 11.5–15.5)
WBC: 9.3 K/uL (ref 4.0–10.5)
nRBC: 0 % (ref 0.0–0.2)

## 2024-07-28 NOTE — Progress Notes (Unsigned)
 [facility]  Telephone:([phone]) [phone] Fax:(336) [phone]  ID: Brandon Lewis OB: 1934-04-11  MR#: [id number]  RDW#:[id number]  Patient Care Team: Jimmy Charlie FERNS, MD as PCP - General Hester Wolm PARAS, MD as PCP - Cardiology (Cardiology) Carolee Manus DASEN., MD (Ophthalmology)  CHIEF COMPLAINT: Elevated free light chains.  INTERVAL HISTORY: Patient is an 89 year old male who was incidentally noted to have both elevated kappa and lambda free light chains.  He has several family members that have had multiple myeloma in the past and requested consultation for further evaluation.  He currently feels well and is asymptomatic.  He has no neurologic complaints.  He denies any recent fevers or illnesses.  He has a good appetite and denies weight loss.  He has no chest pain, shortness of breath, cough, or hemoptysis.  He denies any nausea, vomiting, constipation, or diarrhea.  He has no urinary complaints.  Patient offers no specific complaints today.  REVIEW OF SYSTEMS:   Review of Systems  Constitutional: Negative.  Negative for fever, malaise/fatigue and weight loss.  Respiratory: Negative.  Negative for cough, hemoptysis and shortness of breath.   Cardiovascular: Negative.  Negative for chest pain and leg swelling.  Gastrointestinal: Negative.  Negative for abdominal pain.  Genitourinary: Negative.  Negative for dysuria.  Musculoskeletal: Negative.  Negative for back pain.  Skin: Negative.  Negative for rash.  Neurological: Negative.  Negative for dizziness, focal weakness, weakness and headaches.  Psychiatric/Behavioral: Negative.  The patient is not nervous/anxious.     As per HPI. Otherwise, a complete review of systems is negative.  PAST MEDICAL HISTORY: Past Medical History:  Diagnosis Date   Allergy side effects- NKDA   Anemia    Aortic atherosclerosis    Atrial flutter, paroxysmal (HCC)    a.) postoperatively following CABG   Bladder cancer (HCC) 04/2020    BPH (benign prostatic hyperplasia)    CAD (coronary artery disease) 09/12/2019   a.) LHC 09/12/2019: EF 55-60%; 85% m-dLM, 50% pLCx, 85% p-m LAD, 70% pLAD, 45% mLAD, 70% p-mRCA. b.) 4v CABG 09/16/2019.   CKD (chronic kidney disease), stage III (HCC)    Diabetes mellitus without complication (HCC)    Diverticulosis    GERD (gastroesophageal reflux disease)    History of kidney stones    HTN (hypertension)    Hyperlipidemia    Long term current use of antithrombotics/antiplatelets    a.) DAPT therapies (ASA + clopidogrel )   NSTEMI (non-ST elevated myocardial infarction) (HCC) 09/11/2019   a.) LHC 09/12/2019: 85% m-dLM, 50% pLCx, 85% p-m LAD, 70% pLAD, 45% mLAD, 70% p-mRCA; transferred to [facility] for CVTS consult. Underwent 4v CABG on 09/16/2019.   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   a.) 4v CABG: LIMA-LAD, SVG-RCA, SVG-OM1, SVG-D1   Small bowel obstruction (HCC)    after surgeries   Thoracic ascending aortic aneurysm    a.) measured 4.1 cm by CT on 09/14/2019    PAST SURGICAL HISTORY: Past Surgical History:  Procedure Laterality Date   aorto-bifem  2003   [facility]    APPENDECTOMY     CAROTID STENT     [facility]   CATARACT EXTRACTION, BILATERAL  2017   CHOLECYSTECTOMY  2002   COLONOSCOPY     COLONOSCOPY WITH PROPOFOL  N/A 07/17/2021  Procedure: COLONOSCOPY WITH PROPOFOL ;  Surgeon: Therisa Bi, MD;  Location: [facility] ENDOSCOPY;  Service: Gastroenterology;  Laterality: N/A;   CORONARY ARTERY BYPASS GRAFT N/A 09/16/2019   Procedure: CORONARY ARTERY BYPASS GRAFTING (CABG) using LIMA to LAD; Endoscopic right saphenous vein harvest to OM1, Diag1, and RCA.;  Surgeon: German Bartlett PEDLAR, MD;  Location: [facility] OR;  Service: Open Heart Surgery;  Laterality: N/A;   CYSTOSCOPY  WITH BIOPSY N/A 10/08/2021   Procedure: CYSTOSCOPY WITH BLADDER BIOPSY;  Surgeon: Twylla Glendia BROCKS, MD;  Location: [facility] ORS;  Service: Urology;  Laterality: N/A;   ENDARTERECTOMY FEMORAL Left 03/13/2021   Procedure: ENDARTERECTOMY FEMORAL (GROIN REVISION);  Surgeon: Jama Cordella MATSU, MD;  Location: [facility] ORS;  Service: Vascular;  Laterality: Left;   EYE SURGERY  Cataracts removed ou   LEFT HEART CATH AND CORONARY ANGIOGRAPHY N/A 09/12/2019   Procedure: LEFT HEART CATH AND CORONARY ANGIOGRAPHY;  Surgeon: Hester Wolm PARAS, MD;  Location: [facility] INVASIVE CV LAB;  Service: Cardiovascular;  Laterality: N/A;   LITHOTRIPSY     LOWER EXTREMITY ANGIOGRAPHY Left 01/15/2021   Procedure: LOWER EXTREMITY ANGIOGRAPHY;  Surgeon: Jama Cordella MATSU, MD;  Location: [facility] INVASIVE CV LAB;  Service: Cardiovascular;  Laterality: Left;   SPINE SURGERY  1954   TEE WITHOUT CARDIOVERSION N/A 09/16/2019   Procedure: TRANSESOPHAGEAL ECHOCARDIOGRAM (TEE);  Surgeon: German Bartlett PEDLAR, MD;  Location[facility] OR;  Service: Open Heart Surgery;  Laterality: N/A;   TONSILLECTOMY     TRANSURETHRAL RESECTION OF BLADDER TUMOR  10/08/2021   Procedure: TRANSURETHRAL RESECTION OF BLADDER TUMOR (TURBT);  Surgeon: Twylla Glendia BROCKS, MD;  Location: [facility] ORS;  Service: Urology;;   TRANSURETHRAL RESECTION OF BLADDER TUMOR WITH MITOMYCIN -C N/A 07/24/2020   Procedure: TRANSURETHRAL RESECTION OF BLADDER TUMOR WITH Gemcitabine ;  Surgeon: Twylla Glendia BROCKS, MD;  Location: [facility] ORS;  Service: Urology;  Laterality: N/A;   VASECTOMY  1976   WISDOM TOOTH EXTRACTION      FAMILY HISTORY: Family History  Problem Relation Age of Onset   Diabetes Father        DM - and brother    Stroke Father    Cancer Mother    Coronary artery disease Paternal Grandfather    Colon cancer Neg Hx    Prostate cancer Neg Hx     ADVANCED DIRECTIVES (Y/N):  N  HEALTH MAINTENANCE: Social History   Tobacco Use   Smoking status: Former    Current packs/day: 0.00     Average packs/day: 1 pack/day for 20.0 years (20.0 ttl pk-yrs)    Types: Cigarettes    Start date: 10/20/1972    Quit date: 10/20/1992    Years since quitting: 31.7    Passive exposure: Never   Smokeless tobacco: Never   Tobacco comments:    quit in 1994   Vaping Use   Vaping status: Never Used  Substance Use Topics   Alcohol use: Yes    Alcohol/week: 1.0 standard drink of alcohol    Types: 1 Standard drinks or equivalent per week    Comment: Occasionally at social functions   Drug use: Never     Colonoscopy:  PAP:  Bone density:  Lipid panel:  Allergies  Allergen Reactions   Cilostazol Other (See Comments)    Bowel upset   Simvastatin Other (See Comments)    myalgia    Current Outpatient Medications  Medication Sig Dispense Refill   aspirin  EC 81 MG tablet Take 81 mg by mouth in the morning. Swallow whole.  calcium  carbonate (TUMS - DOSED IN MG ELEMENTAL CALCIUM ) 500 MG chewable tablet Chew 1,000 mg by mouth daily as needed for indigestion or heartburn.     Cholecalciferol  (VITAMIN D ) 50 MCG (2000 UT) CAPS Take 2,000 Units by mouth in the morning.     clopidogrel  (PLAVIX ) 75 MG tablet *VIAL ONLY* TAKE ONE TABLET BY MOUTH EVERY DAY 90 tablet 3   FARXIGA 5 MG TABS tablet Take 5 mg by mouth daily.     fluticasone  (FLONASE ) 50 MCG/ACT nasal spray Place 2 sprays into both nostrils daily. 16 g 11   furosemide  (LASIX ) 40 MG tablet Take 40 mg by mouth.     hydrocortisone  (ANUSOL -HC) 2.5 % rectal cream Place 1 application rectally daily as needed for hemorrhoids or anal itching.     losartan  (COZAAR ) 25 MG tablet Take 25 mg by mouth daily.     MAGNESIUM  GLYCINATE PO Take 500 mg by mouth every evening.     metFORMIN  (GLUCOPHAGE -XR) 500 MG 24 hr tablet * VIAL* TAKE 2 TABLETS (1000MG ) BY MOUTH ONCE DAILY WITH BREAKFAST *TAKE WITH FOOD* *DO NOT CRUSH OR CHEW* *BOTTLE* 60 tablet 11   metoprolol  succinate (TOPROL -XL) 50 MG 24 hr tablet **VIAL ONLY* TAKE (1) TABLET BY MOUTH ONCE A  DAY.DO NOT CRUSH. (HIGHBLOOD PRESSURE)  *DO NOT CRUSH OR CHEW* 30 tablet 11   metroNIDAZOLE  (METROGEL ) 0.75 % gel APPLY 1 APPLICATION TOPICALLY TWICE DAILY 45 g 11   Multiple Vitamin (MULTIVITAMIN WITH MINERALS) TABS tablet Take 1 tablet by mouth daily. Centrum Silver     NEXLIZET 180-10 MG TABS Take 1 tablet by mouth daily.     pantoprazole  (PROTONIX ) 40 MG tablet Take 1 tablet (40 mg total) by mouth daily as needed (indigestion/heartburn). 90 tablet 3   sodium chloride  (OCEAN) 0.65 % nasal spray Place 1 spray into the nose daily as needed (sinus congestion).     tamsulosin  (FLOMAX ) 0.4 MG CAPS capsule TAKE 1 CAPSULE BY MOUTH EVERY DAY 90 capsule 3   TURMERIC CURCUMIN PO Take 2 tablets by mouth daily.     No current facility-administered medications for this visit.    OBJECTIVE: Vitals:   07/28/24 1339  BP: (!) 117/54  Pulse: 71  Resp: 18  Temp: 98.9 F (37.2 C)  SpO2: 100%     Body mass index is 25.97 kg/m.    ECOG FS:0 - Asymptomatic  General: Well-developed, well-nourished, no acute distress. Eyes: Pink conjunctiva, anicteric sclera. HEENT: Normocephalic, moist mucous membranes. Lungs: No audible wheezing or coughing. Heart: Regular rate and rhythm. Abdomen: Soft, nontender, no obvious distention. Musculoskeletal: No edema, cyanosis, or clubbing. Neuro: Alert, answering all questions appropriately. Cranial nerves grossly intact. Skin: No rashes or petechiae noted. Psych: Normal affect. Lymphatics: No cervical, calvicular, axillary or inguinal LAD.   LAB RESULTS:  Lab Results  Component Value Date   NA 135 07/28/2024   K 4.4 07/28/2024   CL 103 07/28/2024   CO2 23 07/28/2024   GLUCOSE 146 (H) 07/28/2024   BUN 52 (H) 07/28/2024   CREATININE 2.23 (H) 07/28/2024   CALCIUM  9.0 07/28/2024   PROT 7.5 09/11/2023   ALBUMIN  4.1 09/11/2023   ALBUMIN  4.1 09/11/2023   AST 26 09/11/2023   ALT 15 09/11/2023   ALKPHOS 58 09/11/2023   BILITOT 0.7 09/11/2023   GFRNONAA 28  (L) 07/28/2024   GFRAA 45 (L) 10/01/2019    Lab Results  Component Value Date   WBC 9.3 07/28/2024   NEUTROABS 6.3 07/28/2024   HGB  9.0 (L) 07/28/2024   HCT 28.0 (L) 07/28/2024   MCV 125.0 (H) 07/28/2024   PLT 157 07/28/2024     STUDIES: No results found.  ASSESSMENT: Elevated free light chains.  PLAN:    Elevated free light chains: Patient previously noted to have both kappa and lambda free light chains elevated.  This is likely clinically insignificant, but patient reports he has had several family members with multiple myeloma and wished to be sure this was not an underlying etiology.  SPEP, immunoglobulins, and repeat kappa/lambda free light chains are pending at time of dictation.  Patient has a history of chronic renal insufficiency and anemia, but no other evidence of endorgan damage.  He does not require bone marrow biopsy or imaging at this time.  Return to clinic in 3 weeks for further evaluation and discussion of his laboratory results. Renal insufficiency: Patient's creatinine is 2.23 today which appears to be approximately his baseline.  Continue evaluation and treatment per nephrology. Macrocytic anemia: Patient's hemoglobin is 9.0 today.  Likely related to his underlying renal insufficiency and patient may benefit from Retacrit in the future.  Will draw iron  panel, B12, folate at next clinic visit.   I spent a total of 45 minutes reviewing chart data, face-to-face evaluation with the patient, counseling and coordination of care as detailed above.   Patient expressed understanding and was in agreement with this plan. He also understands that He can call clinic at any time with any questions, concerns, or complaints.     Evalene JINNY Reusing, MD   07/29/2024 7:49 AM

## 2024-07-28 NOTE — Progress Notes (Unsigned)
 Patient would like to be more aware of what can and can't make his condition worse or better.

## 2024-07-29 LAB — KAPPA/LAMBDA LIGHT CHAINS
Kappa free light chain: 108.5 mg/L — ABNORMAL HIGH (ref 3.3–19.4)
Kappa, lambda light chain ratio: 1.44 (ref 0.26–1.65)
Lambda free light chains: 75.2 mg/L — ABNORMAL HIGH (ref 5.7–26.3)

## 2024-07-30 LAB — IGG, IGA, IGM
IgA: 387 mg/dL (ref 61–437)
IgG (Immunoglobin G), Serum: 1619 mg/dL — ABNORMAL HIGH (ref 603–1613)
IgM (Immunoglobulin M), Srm: 57 mg/dL (ref 15–143)

## 2024-08-01 ENCOUNTER — Telehealth: Payer: Self-pay | Admitting: Physical Medicine & Rehabilitation

## 2024-08-01 LAB — PROTEIN ELECTROPHORESIS, SERUM
A/G Ratio: 0.9 (ref 0.7–1.7)
Albumin ELP: 3.4 g/dL (ref 2.9–4.4)
Alpha-1-Globulin: 0.2 g/dL (ref 0.0–0.4)
Alpha-2-Globulin: 0.8 g/dL (ref 0.4–1.0)
Beta Globulin: 1.2 g/dL (ref 0.7–1.3)
Gamma Globulin: 1.6 g/dL (ref 0.4–1.8)
Globulin, Total: 3.8 g/dL (ref 2.2–3.9)
Total Protein ELP: 7.2 g/dL (ref 6.0–8.5)

## 2024-08-01 NOTE — Telephone Encounter (Signed)
 Pt called and resch his appt and would like you to give him a call back.

## 2024-08-01 NOTE — Progress Notes (Unsigned)
 BCG Bladder Instillation  BCG # 3/3  Due to Bladder Cancer patient is present today for a BCG treatment. Patient was cleaned and prepped in a sterile fashion with betadine. A 14 FR catheter was inserted, urine return was noted 150 ml, urine was yellow in color.  50ml of reconstituted BCG was instilled into the bladder. The catheter was then removed. Patient tolerated well, no complications were noted  Performed by: CLOTILDA CORNWALL, PA-C and Nya E Bynum, CMA    Follow up/ Additional notes: Return on 11/26 for surveillance cystoscopy with Dr. Twylla

## 2024-08-02 ENCOUNTER — Encounter: Payer: Self-pay | Admitting: Urology

## 2024-08-02 ENCOUNTER — Ambulatory Visit: Admitting: Urology

## 2024-08-02 VITALS — BP 100/61 | HR 85 | Ht 70.0 in | Wt 175.0 lb

## 2024-08-02 DIAGNOSIS — C679 Malignant neoplasm of bladder, unspecified: Secondary | ICD-10-CM

## 2024-08-02 LAB — MICROSCOPIC EXAMINATION

## 2024-08-02 LAB — URINALYSIS, COMPLETE
Bilirubin, UA: NEGATIVE
Glucose, UA: NEGATIVE
Ketones, UA: NEGATIVE
Nitrite, UA: NEGATIVE
Protein,UA: NEGATIVE
RBC, UA: NEGATIVE
Specific Gravity, UA: 1.02 (ref 1.005–1.030)
Urobilinogen, Ur: 0.2 mg/dL (ref 0.2–1.0)
pH, UA: 6 (ref 5.0–7.5)

## 2024-08-02 MED ORDER — BCG LIVE 50 MG IS SUSR
3.2400 mL | Freq: Once | INTRAVESICAL | Status: AC
  Administered 2024-08-02: 81 mg via INTRAVESICAL

## 2024-08-02 NOTE — Patient Instructions (Addendum)
 Patient Education: (BCG) Into the Bladder (Intravesical Chemotherapy)  BCG is a vaccine which is used to prevent tuberculosis (TB).  But it's also a helpful treatment for some early bladder cancers.  When BCG goes directly into the bladder the treatment is described as intravesical.  BCG is a type of immunotherapy.  Immunotherapy stimulates the body's immune system to destroy cancer cells.  How it's given BCG treatment is given to you in an outpatient setting.  It takes a few minutes to administer and you can go home as soon as it's finished.  It might be a good idea to ask someone to bring you, particularly the fist time.  Unlike chemotherapy into the bladder, BCG treatment is never given immediately after surgery to remove bladder tumors.  There needs to be a delay usually of at least two weeks after surgery, before you can have it.  You won't be given treatment with BCG if you are unwell or have an infection in your urine.  You're usually asked to limit the amount you drink before your treatment.  This will help to increase the concentration of BCG in your bladder.  Drinking too much before your treatment may make your bladder feel uncomfortably full.  If you normally take water  tablets (diuretics) take them later in the day after your treatment.  Your nurse or doctor will give you more advise about preparing for your treatment.  You will have a small tube (catheter) placed into your bladder.  Your doctor will then put the liquid vaccine directly into your bladder through the catheter and remove the catheter.  You will need to hold your urine for two hours afterwards.  Rotating every 15 minutes from side to side. This can be difficult but it's to give the treatment time to work.  When the treatment is over you can go to the toilet.  After your treatment there are some precautions you'll need to take.  This is because BCG is a live vaccine and other people shouldn't be exposed to it.  For the  next six hours, you'll need to avoid your urine splashing on the toilet seat and getting any urine on your hands.  It might be easer for men to sit down when they're using an ordinary toilet although using a stand up urinal should be alright.  The main this is to avoid splashing urine and spreading the vaccine.  You will also be asked to put 1/2 cup undiluted bleach into the toilet to destroy any live vaccine and leave it for 15 minutes until you flush for the next 6 voids.  Side Effects Because BCG goes directly into the bladder most of the side effects are linked with the bladder.  They usually go away within one to two days after your treatment.  The most common ones are: -needing to pass urine often -pain when you pass urine -blood in urine -flu-like symptoms (tiredness, general aching and raised temperature)  Theses side effects should settle down within a day or two.  If they don't get better contact your doctor.  Drinking lots of fluids can help flush the drug out of your bladder and reduce some of these effects.  Taking Ibuprofen or Aleve is encouraged unless you have a condition that would make these medications unsafe to take (renal failure, diabetes, gerd)  Rare side effects can include a continuing high temperature (fever), pain in your joints and a cough.  If you have any of these symptoms, or if you  feel generally unwell, contact your doctor.  These symptoms could be a sign of a more serious infection (due to BCG) that needs to be treated immediately.  If this happens you'll be treated with the same drugs (antibiotics) that are used to treat TB.  Contraception Men should use a condom during sex for the first 48 hours after their treatment.  If you are a women who has had BCG treatment then your partner should use a condom.  Using a condom will protect your partner from any vaccine present in your semen or vaginal fluid.  We don't know how BCG may affect a developing fetus so it's not  advisable to become pregnant or father a child while having it.  It is important to use effective contraception during your treatment and for six weeks afterwards.  You can discuss this with your doctor or specialist nurse.   Your Timeline for Today:  Right now through 2:25 pm: Hold your urine and do your quarter turns every 15 minutes until 4:25 pm today: Every time you urinate, pour 1/2 cup of bleach into the toilet and let it sit for 15 minutes prior to flushing.  Resume your normal routine.

## 2024-08-16 ENCOUNTER — Inpatient Hospital Stay

## 2024-08-16 DIAGNOSIS — R7689 Other specified abnormal immunological findings in serum: Secondary | ICD-10-CM

## 2024-08-16 LAB — LACTATE DEHYDROGENASE: LDH: 211 U/L — ABNORMAL HIGH (ref 98–192)

## 2024-08-16 LAB — IRON AND TIBC
Iron: 64 ug/dL (ref 45–182)
Saturation Ratios: 18 % (ref 17.9–39.5)
TIBC: 354 ug/dL (ref 250–450)
UIBC: 290 ug/dL

## 2024-08-16 LAB — VITAMIN B12: Vitamin B-12: 626 pg/mL (ref 180–914)

## 2024-08-16 LAB — FERRITIN: Ferritin: 55 ng/mL (ref 24–336)

## 2024-08-16 LAB — CBC (CANCER CENTER ONLY)
HCT: 30.7 % — ABNORMAL LOW (ref 39.0–52.0)
Hemoglobin: 9.6 g/dL — ABNORMAL LOW (ref 13.0–17.0)
MCH: 39.7 pg — ABNORMAL HIGH (ref 26.0–34.0)
MCHC: 31.3 g/dL (ref 30.0–36.0)
MCV: 126.9 fL — ABNORMAL HIGH (ref 80.0–100.0)
Platelet Count: 181 K/uL (ref 150–400)
RBC: 2.42 MIL/uL — ABNORMAL LOW (ref 4.22–5.81)
RDW: 16.5 % — ABNORMAL HIGH (ref 11.5–15.5)
WBC Count: 11 K/uL — ABNORMAL HIGH (ref 4.0–10.5)
nRBC: 0 % (ref 0.0–0.2)

## 2024-08-16 LAB — FOLATE: Folate: >20 ng/mL (ref >5.9)

## 2024-08-17 ENCOUNTER — Inpatient Hospital Stay: Admitting: Oncology

## 2024-08-17 LAB — ERYTHROPOIETIN: Erythropoietin: 20.9 m[IU]/mL — ABNORMAL HIGH (ref 2.6–18.5)

## 2024-08-23 ENCOUNTER — Encounter: Payer: Self-pay | Admitting: Oncology

## 2024-08-23 ENCOUNTER — Inpatient Hospital Stay: Attending: Oncology | Admitting: Oncology

## 2024-08-23 VITALS — BP 120/62 | HR 72 | Temp 98.6°F | Resp 18 | Ht 70.0 in | Wt 185.0 lb

## 2024-08-23 DIAGNOSIS — R7689 Other specified abnormal immunological findings in serum: Secondary | ICD-10-CM | POA: Insufficient documentation

## 2024-08-23 DIAGNOSIS — D539 Nutritional anemia, unspecified: Secondary | ICD-10-CM | POA: Insufficient documentation

## 2024-08-23 DIAGNOSIS — Z79899 Other long term (current) drug therapy: Secondary | ICD-10-CM | POA: Diagnosis not present

## 2024-08-23 DIAGNOSIS — Z87891 Personal history of nicotine dependence: Secondary | ICD-10-CM | POA: Insufficient documentation

## 2024-08-23 DIAGNOSIS — N289 Disorder of kidney and ureter, unspecified: Secondary | ICD-10-CM | POA: Diagnosis not present

## 2024-08-23 NOTE — Progress Notes (Unsigned)
 [facility]  [phone] [phone]  ID: Elsie VEAR Schimke OB: 10-20-1934  MR#: [id number]  RDW#:[id number]  Patient Care Team: Jimmy Charlie FERNS, MD as PCP - General Hester Wolm PARAS, MD as PCP - Cardiology (Cardiology) Carolee Manus DASEN., MD (Ophthalmology)  CHIEF COMPLAINT: Elevated free light chains.  INTERVAL HISTORY: Patient returns to clinic today for further evaluation and discussion of his laboratory results.  He currently feels well and is asymptomatic.  He has no neurologic complaints.  He denies any recent fevers or illnesses.  He has a good appetite and denies weight loss.  He has no chest pain, shortness of breath, cough, or hemoptysis.  He denies any nausea, vomiting, constipation, or diarrhea.  He has no urinary complaints.  Patient offers no specific complaints today.    REVIEW OF SYSTEMS:   Review of Systems  Constitutional: Negative.  Negative for fever, malaise/fatigue and weight loss.  Respiratory: Negative.  Negative for cough, hemoptysis and shortness of breath.   Cardiovascular: Negative.  Negative for chest pain and leg swelling.  Gastrointestinal: Negative.  Negative for abdominal pain.  Genitourinary: Negative.  Negative for dysuria.  Musculoskeletal: Negative.  Negative for back pain.  Skin: Negative.  Negative for rash.  Neurological: Negative.  Negative for dizziness, focal weakness, weakness and headaches.  Psychiatric/Behavioral: Negative.  The patient is not nervous/anxious.     As per HPI. Otherwise, a complete review of systems is negative.  PAST MEDICAL HISTORY: Past Medical History:  Diagnosis Date   Allergy side effects- NKDA   Anemia    Aortic atherosclerosis    Atrial flutter, paroxysmal (HCC)    a.) postoperatively following CABG   Bladder cancer (HCC) 04/2020   BPH (benign prostatic hyperplasia)    CAD (coronary artery disease) 09/12/2019   a.) LHC 09/12/2019: EF 55-60%; 85% m-dLM, 50% pLCx, 85%  p-m LAD, 70% pLAD, 45% mLAD, 70% p-mRCA. b.) 4v CABG 09/16/2019.   CKD (chronic kidney disease), stage III (HCC)    Diabetes mellitus without complication (HCC)    Diverticulosis    GERD (gastroesophageal reflux disease)    History of kidney stones    HTN (hypertension)    Hyperlipidemia    Long term current use of antithrombotics/antiplatelets    a.) DAPT therapies (ASA + clopidogrel )   NSTEMI (non-ST elevated myocardial infarction) (HCC) 09/11/2019   a.) LHC 09/12/2019: 85% m-dLM, 50% pLCx, 85% p-m LAD, 70% pLAD, 45% mLAD, 70% p-mRCA; transferred to [facility] for CVTS consult. Underwent 4v CABG on 09/16/2019.   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   a.) 4v CABG: LIMA-LAD, SVG-RCA, SVG-OM1, SVG-D1   Small bowel obstruction (HCC)    after surgeries   Thoracic ascending aortic aneurysm    a.) measured 4.1 cm by CT on 09/14/2019    PAST SURGICAL HISTORY: Past Surgical History:  Procedure Laterality Date   aorto-bifem  2003   [facility]    APPENDECTOMY     CAROTID STENT     [facility]   CATARACT EXTRACTION, BILATERAL  2017   CHOLECYSTECTOMY  2002   COLONOSCOPY     COLONOSCOPY WITH PROPOFOL  N/A 07/17/2021   Procedure: COLONOSCOPY WITH PROPOFOL ;  Surgeon: Therisa Bi, MD;  Location: [facility] ENDOSCOPY;  Service: Gastroenterology;  Laterality: N/A;  CORONARY ARTERY BYPASS GRAFT N/A 09/16/2019   Procedure: CORONARY ARTERY BYPASS GRAFTING (CABG) using LIMA to LAD; Endoscopic right saphenous vein harvest to OM1, Diag1, and RCA.;  Surgeon: German Bartlett PEDLAR, MD;  Location: [facility] OR;  Service: Open Heart Surgery;  Laterality: N/A;   CYSTOSCOPY WITH BIOPSY N/A 10/08/2021   Procedure: CYSTOSCOPY WITH BLADDER BIOPSY;  Surgeon: Twylla Glendia BROCKS, MD;  Location: [facility] ORS;  Service: Urology;   Laterality: N/A;   ENDARTERECTOMY FEMORAL Left 03/13/2021   Procedure: ENDARTERECTOMY FEMORAL (GROIN REVISION);  Surgeon: Jama Cordella MATSU, MD;  Location: [facility] ORS;  Service: Vascular;  Laterality: Left;   EYE SURGERY  Cataracts removed ou   LEFT HEART CATH AND CORONARY ANGIOGRAPHY N/A 09/12/2019   Procedure: LEFT HEART CATH AND CORONARY ANGIOGRAPHY;  Surgeon: Hester Wolm PARAS, MD;  Location: [facility] INVASIVE CV LAB;  Service: Cardiovascular;  Laterality: N/A;   LITHOTRIPSY     LOWER EXTREMITY ANGIOGRAPHY Left 01/15/2021   Procedure: LOWER EXTREMITY ANGIOGRAPHY;  Surgeon: Jama Cordella MATSU, MD;  Location: [facility] INVASIVE CV LAB;  Service: Cardiovascular;  Laterality: Left;   SPINE SURGERY  1954   TEE WITHOUT CARDIOVERSION N/A 09/16/2019   Procedure: TRANSESOPHAGEAL ECHOCARDIOGRAM (TEE);  Surgeon: German Bartlett PEDLAR, MD;  Location: [facility] OR;  Service: Open Heart Surgery;  Laterality: N/A;   TONSILLECTOMY     TRANSURETHRAL RESECTION OF BLADDER TUMOR  10/08/2021   Procedure: TRANSURETHRAL RESECTION OF BLADDER TUMOR (TURBT);  Surgeon: Twylla Glendia BROCKS, MD;  Location: [facility] ORS;  Service: Urology;;   TRANSURETHRAL RESECTION OF BLADDER TUMOR WITH MITOMYCIN -C N/A 07/24/2020   Procedure: TRANSURETHRAL RESECTION OF BLADDER TUMOR WITH Gemcitabine ;  Surgeon: Twylla Glendia BROCKS, MD;  Location: [facility] ORS;  Service: Urology;  Laterality: N/A;   VASECTOMY  1976   WISDOM TOOTH EXTRACTION      FAMILY HISTORY: Family History  Problem Relation Age of Onset   Diabetes Father        DM - and brother    Stroke Father    Cancer Mother    Coronary artery disease Paternal Grandfather    Colon cancer Neg Hx    Prostate cancer Neg Hx     ADVANCED DIRECTIVES (Y/N):  N  HEALTH MAINTENANCE: Social History   Tobacco Use   Smoking status: Former    Current packs/day: 0.00    Average packs/day: 1 pack/day for 20.0 years (20.0 ttl pk-yrs)    Types: Cigarettes    Start date: 10/20/1972    Quit date: 10/20/1992     Years since quitting: 31.8    Passive exposure: Never   Smokeless tobacco: Never   Tobacco comments:    quit in 1994   Vaping Use   Vaping status: Never Used  Substance Use Topics   Alcohol use: Yes    Alcohol/week: 1.0 standard drink of alcohol    Types: 1 Standard drinks or equivalent per week    Comment: Occasionally at social functions   Drug use: Never     Colonoscopy:  PAP:  Bone density:  Lipid panel:  Allergies  Allergen Reactions   Cilostazol Other (See Comments)    Bowel upset   Simvastatin Other (See Comments)    myalgia    Current Outpatient Medications  Medication Sig Dispense Refill   aspirin  EC 81 MG tablet Take 81 mg by mouth in the morning. Swallow whole.     calcium  carbonate (TUMS - DOSED IN MG ELEMENTAL CALCIUM ) 500 MG chewable tablet Chew 1,000 mg by mouth daily as  needed for indigestion or heartburn.     Cholecalciferol  (VITAMIN D ) 50 MCG (2000 UT) CAPS Take 2,000 Units by mouth in the morning.     FARXIGA 5 MG TABS tablet Take 5 mg by mouth daily.     fluticasone  (FLONASE ) 50 MCG/ACT nasal spray Place 2 sprays into both nostrils daily. 16 g 11   furosemide  (LASIX ) 40 MG tablet Take 40 mg by mouth.     hydrocortisone  (ANUSOL -HC) 2.5 % rectal cream Place 1 application rectally daily as needed for hemorrhoids or anal itching.     losartan  (COZAAR ) 25 MG tablet Take 25 mg by mouth daily.     MAGNESIUM  GLYCINATE PO Take 500 mg by mouth every evening.     metFORMIN  (GLUCOPHAGE -XR) 500 MG 24 hr tablet * VIAL* TAKE 2 TABLETS (1000MG ) BY MOUTH ONCE DAILY WITH BREAKFAST *TAKE WITH FOOD* *DO NOT CRUSH OR CHEW* *BOTTLE* 60 tablet 11   metoprolol  succinate (TOPROL -XL) 50 MG 24 hr tablet **VIAL ONLY* TAKE (1) TABLET BY MOUTH ONCE A DAY.DO NOT CRUSH. (HIGHBLOOD PRESSURE)  *DO NOT CRUSH OR CHEW* 30 tablet 11   metroNIDAZOLE  (METROGEL ) 0.75 % gel APPLY 1 APPLICATION TOPICALLY TWICE DAILY 45 g 11   Multiple Vitamin (MULTIVITAMIN WITH MINERALS) TABS tablet Take 1 tablet  by mouth daily. Centrum Silver     NEXLIZET 180-10 MG TABS Take 1 tablet by mouth daily.     pantoprazole  (PROTONIX ) 40 MG tablet Take 1 tablet (40 mg total) by mouth daily as needed (indigestion/heartburn). 90 tablet 3   sodium chloride  (OCEAN) 0.65 % nasal spray Place 1 spray into the nose daily as needed (sinus congestion).     tamsulosin  (FLOMAX ) 0.4 MG CAPS capsule TAKE 1 CAPSULE BY MOUTH EVERY DAY 90 capsule 3   TURMERIC CURCUMIN PO Take 2 tablets by mouth daily.     No current facility-administered medications for this visit.    OBJECTIVE: Vitals:   08/23/24 1444  BP: 120/62  Pulse: 72  Resp: 18  Temp: 98.6 F (37 C)  SpO2: 100%     Body mass index is 26.54 kg/m.    ECOG FS:0 - Asymptomatic  General: Well-developed, well-nourished, no acute distress. Eyes: Pink conjunctiva, anicteric sclera. HEENT: Normocephalic, moist mucous membranes. Lungs: No audible wheezing or coughing. Heart: Regular rate and rhythm. Abdomen: Soft, nontender, no obvious distention. Musculoskeletal: No edema, cyanosis, or clubbing. Neuro: Alert, answering all questions appropriately. Cranial nerves grossly intact. Skin: No rashes or petechiae noted. Psych: Normal affect.  LAB RESULTS:  Lab Results  Component Value Date   NA 135 07/28/2024   K 4.4 07/28/2024   CL 103 07/28/2024   CO2 23 07/28/2024   GLUCOSE 146 (H) 07/28/2024   BUN 52 (H) 07/28/2024   CREATININE 2.23 (H) 07/28/2024   CALCIUM  9.0 07/28/2024   PROT 7.5 09/11/2023   ALBUMIN  4.1 09/11/2023   ALBUMIN  4.1 09/11/2023   AST 26 09/11/2023   ALT 15 09/11/2023   ALKPHOS 58 09/11/2023   BILITOT 0.7 09/11/2023   GFRNONAA 28 (L) 07/28/2024   GFRAA 45 (L) 10/01/2019    Lab Results  Component Value Date   WBC 11.0 (H) 08/16/2024   NEUTROABS 6.3 07/28/2024   HGB 9.6 (L) 08/16/2024   HCT 30.7 (L) 08/16/2024   MCV 126.9 (H) 08/16/2024   PLT 181 08/16/2024     STUDIES: No results found.  ASSESSMENT: Elevated free light  chains.  PLAN:    Elevated free light chains: Patient noted to have both  kappa and lambda free light chains elevated with a normal light chain ratio.  This is clinically insignificant.  He has a mildly elevated IgG level of 1619.  SPEP is negative.  He has mild renal insufficiency and anemia, but no other evidence of endorgan damage.  He does not require bone marrow biopsy or imaging at this time.  To clinic in 6 months with repeat laboratory can evaluation by APP.   Renal insufficiency: His most recent creatinine is 2.23 which appears to be approximately his baseline.  Continue evaluation and treatment per nephrology. Macrocytic anemia: Patient's most recent hemoglobin is 9.6.  Iron  panel, B12, folate, and hemolysis labs are all either negative or within normal limits.  Likely related to his underlying renal insufficiency and patient may benefit from Retacrit in the future.     Patient expressed understanding and was in agreement with this plan. He also understands that He can call clinic at any time with any questions, concerns, or complaints.     Evalene JINNY Reusing, MD   08/23/2024 3:02 PM

## 2024-08-25 ENCOUNTER — Ambulatory Visit: Admitting: Physical Medicine & Rehabilitation

## 2024-08-31 ENCOUNTER — Other Ambulatory Visit: Payer: Self-pay

## 2024-08-31 MED ORDER — METFORMIN HCL ER 500 MG PO TB24: ORAL_TABLET | ORAL | 1 refills | Status: AC

## 2024-09-02 ENCOUNTER — Other Ambulatory Visit (INDEPENDENT_AMBULATORY_CARE_PROVIDER_SITE_OTHER): Payer: Self-pay | Admitting: Vascular Surgery

## 2024-09-02 DIAGNOSIS — I70219 Atherosclerosis of native arteries of extremities with intermittent claudication, unspecified extremity: Secondary | ICD-10-CM

## 2024-09-05 ENCOUNTER — Ambulatory Visit (INDEPENDENT_AMBULATORY_CARE_PROVIDER_SITE_OTHER): Payer: Medicare PPO | Admitting: Vascular Surgery

## 2024-09-05 ENCOUNTER — Encounter (INDEPENDENT_AMBULATORY_CARE_PROVIDER_SITE_OTHER): Payer: Self-pay | Admitting: Vascular Surgery

## 2024-09-05 ENCOUNTER — Ambulatory Visit (INDEPENDENT_AMBULATORY_CARE_PROVIDER_SITE_OTHER): Payer: Medicare PPO

## 2024-09-05 VITALS — BP 102/59 | HR 73 | Resp 18 | Ht 70.0 in | Wt 179.4 lb

## 2024-09-05 DIAGNOSIS — E1159 Type 2 diabetes mellitus with other circulatory complications: Secondary | ICD-10-CM

## 2024-09-05 DIAGNOSIS — I70219 Atherosclerosis of native arteries of extremities with intermittent claudication, unspecified extremity: Secondary | ICD-10-CM

## 2024-09-05 DIAGNOSIS — E785 Hyperlipidemia, unspecified: Secondary | ICD-10-CM

## 2024-09-05 DIAGNOSIS — I10 Essential (primary) hypertension: Secondary | ICD-10-CM

## 2024-09-05 DIAGNOSIS — I2510 Atherosclerotic heart disease of native coronary artery without angina pectoris: Secondary | ICD-10-CM | POA: Diagnosis not present

## 2024-09-05 NOTE — Progress Notes (Unsigned)
 MRN : [id number]  Brandon Lewis is a 89 y.o. (November 05, 1933) male who presents with chief complaint of check circulation.  History of Present Illness:   The patient returns to the office for followup and review of the noninvasive studies.  He has a history of remote aortobifemoral bypass grafting.   He is s/p redo left common femoral, superficial femoral and profunda femoris endarterectomy with Cormatrix patch angioplasty on 03/13/2021.   There have been no interval changes in lower extremity symptoms.  In fact he feels that he is walking as best as he ever has.  No interval shortening of the patient's claudication distance (he states that remains about the same) no development of rest pain symptoms. No new ulcers or wounds have occurred since the last visit.   There have been no significant changes to the patient's overall health care.   The patient denies amaurosis fugax or recent TIA symptoms. There are no documented recent neurological changes noted. There is no history of DVT, PE or superficial thrombophlebitis. The patient denies recent episodes of angina or shortness of breath.    ABI Rt=0.91 and Lt=0.91  (previous ABI's Rt=0.51 and Lt=0.58).   Duplex ultrasound of the abdominal aorta and bilateral iliac arteries shows a widely patent aortobifemoral bypass graft no evidence of hemodynamically significant restenosis.  Current Meds  Medication Sig   aspirin  EC 81 MG tablet Take 81 mg by mouth in the morning. Swallow whole.   calcium  carbonate (TUMS - DOSED IN MG ELEMENTAL CALCIUM ) 500 MG chewable tablet Chew 1,000 mg by mouth daily as needed for indigestion or heartburn.   Cholecalciferol  (VITAMIN D ) 50 MCG (2000 UT) CAPS Take 2,000 Units by mouth in the morning.   FARXIGA 5 MG TABS tablet Take 5 mg by mouth daily.   fluticasone  (FLONASE ) 50 MCG/ACT nasal spray Place 2 sprays into both nostrils daily.    furosemide  (LASIX ) 40 MG tablet Take 40 mg by mouth.   hydrocortisone  (ANUSOL -HC) 2.5 % rectal cream Place 1 application rectally daily as needed for hemorrhoids or anal itching.   losartan  (COZAAR ) 25 MG tablet Take 25 mg by mouth daily.   MAGNESIUM  GLYCINATE PO Take 500 mg by mouth every evening.   metFORMIN  (GLUCOPHAGE -XR) 500 MG 24 hr tablet * VIAL* TAKE 2 TABLETS (1000MG ) BY MOUTH ONCE DAILY WITH BREAKFAST *TAKE WITH FOOD* *DO NOT CRUSH OR CHEW* *BOTTLE*   metoprolol  succinate (TOPROL -XL) 50 MG 24 hr tablet **VIAL ONLY* TAKE (1) TABLET BY MOUTH ONCE A DAY.DO NOT CRUSH. (HIGHBLOOD PRESSURE)  *DO NOT CRUSH OR CHEW*   metroNIDAZOLE  (METROGEL ) 0.75 % gel APPLY 1 APPLICATION TOPICALLY TWICE DAILY   Multiple Vitamin (MULTIVITAMIN WITH MINERALS) TABS tablet Take 1 tablet by mouth daily. Centrum Silver   pantoprazole  (PROTONIX ) 40 MG tablet Take 1 tablet (40 mg total) by mouth daily as needed (indigestion/heartburn).   sodium chloride  (OCEAN) 0.65 % nasal spray Place 1 spray into the nose daily as needed (sinus congestion).   tamsulosin  (FLOMAX ) 0.4 MG CAPS capsule TAKE 1 CAPSULE BY MOUTH EVERY DAY   TURMERIC CURCUMIN PO Take 2 tablets  by mouth daily.    Past Medical History:  Diagnosis Date   Allergy side effects- NKDA   Anemia    Aortic atherosclerosis    Atrial flutter, paroxysmal (HCC)    a.) postoperatively following CABG   Bladder cancer (HCC) 04/2020   BPH (benign prostatic hyperplasia)    CAD (coronary artery disease) 09/12/2019   a.) LHC 09/12/2019: EF 55-60%; 85% m-dLM, 50% pLCx, 85% p-m LAD, 70% pLAD, 45% mLAD, 70% p-mRCA. b.) 4v CABG 09/16/2019.   CKD (chronic kidney disease), stage III (HCC)    Diabetes mellitus without complication (HCC)    Diverticulosis    GERD (gastroesophageal reflux disease)    History of kidney stones    HTN (hypertension)    Hyperlipidemia    Long term current use of antithrombotics/antiplatelets    a.) DAPT therapies (ASA + clopidogrel )   NSTEMI  (non-ST elevated myocardial infarction) (HCC) 09/11/2019   a.) LHC 09/12/2019: 85% m-dLM, 50% pLCx, 85% p-m LAD, 70% pLAD, 45% mLAD, 70% p-mRCA; transferred to [facility] for CVTS consult. Underwent 4v CABG on 09/16/2019.   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   a.) 4v CABG: LIMA-LAD, SVG-RCA, SVG-OM1, SVG-D1   Small bowel obstruction (HCC)    after surgeries   Thoracic ascending aortic aneurysm    a.) measured 4.1 cm by CT on 09/14/2019    Past Surgical History:  Procedure Laterality Date   aorto-bifem  2003   [facility]    APPENDECTOMY     CAROTID STENT     [facility]   CATARACT EXTRACTION, BILATERAL  2017   CHOLECYSTECTOMY  2002   COLONOSCOPY     COLONOSCOPY WITH PROPOFOL  N/A 07/17/2021   Procedure: COLONOSCOPY WITH PROPOFOL ;  Surgeon: Therisa Bi, MD;  Location: [facility] ENDOSCOPY;  Service: Gastroenterology;  Laterality: N/A;   CORONARY ARTERY BYPASS GRAFT N/A 09/16/2019   Procedure: CORONARY ARTERY BYPASS GRAFTING (CABG) using LIMA to LAD; Endoscopic right saphenous vein harvest to OM1, Diag1, and RCA.;  Surgeon: German Bartlett PEDLAR, MD;  Location: [facility] OR;  Service: Open Heart Surgery;  Laterality: N/A;   CYSTOSCOPY WITH BIOPSY N/A 10/08/2021   Procedure: CYSTOSCOPY WITH BLADDER BIOPSY;  Surgeon: Twylla Glendia BROCKS, MD;  Location: [facility] ORS;  Service: Urology;  Laterality: N/A;   ENDARTERECTOMY FEMORAL Left 03/13/2021   Procedure: ENDARTERECTOMY FEMORAL (GROIN REVISION);  Surgeon: Jama Cordella MATSU, MD;  Location: [facility] ORS;  Service: Vascular;  Laterality: Left;   EYE SURGERY  Cataracts removed ou   LEFT HEART CATH AND CORONARY ANGIOGRAPHY N/A 09/12/2019   Procedure: LEFT HEART CATH AND CORONARY ANGIOGRAPHY;  Surgeon: Hester Wolm PARAS, MD;  Location: [facility] INVASIVE CV LAB;   Service: Cardiovascular;  Laterality: N/A;   LITHOTRIPSY     LOWER EXTREMITY ANGIOGRAPHY Left 01/15/2021   Procedure: LOWER EXTREMITY ANGIOGRAPHY;  Surgeon: Jama Cordella MATSU, MD;  Location: [facility] INVASIVE CV LAB;  Service: Cardiovascular;  Laterality: Left;   SPINE SURGERY  1954   TEE WITHOUT CARDIOVERSION N/A 09/16/2019   Procedure: TRANSESOPHAGEAL ECHOCARDIOGRAM (TEE);  Surgeon: German Bartlett PEDLAR, MD;  Location: [facility] [place] OR;  Service: Open Heart Surgery;  Laterality: N/A;   TONSILLECTOMY     TRANSURETHRAL RESECTION OF BLADDER TUMOR  10/08/2021   Procedure: TRANSURETHRAL RESECTION OF BLADDER TUMOR (TURBT);  Surgeon: Twylla,  Glendia BROCKS, MD;  Location: [facility] ORS;  Service: Urology;;   TRANSURETHRAL RESECTION OF BLADDER TUMOR WITH MITOMYCIN -C N/A 07/24/2020   Procedure: TRANSURETHRAL RESECTION OF BLADDER TUMOR WITH Gemcitabine ;  Surgeon: Twylla Glendia BROCKS, MD;  Location: [facility] ORS;  Service: Urology;  Laterality: N/A;   VASECTOMY  1976   WISDOM TOOTH EXTRACTION      Social History Social History   Tobacco Use   Smoking status: Former    Current packs/day: 0.00    Average packs/day: 1 pack/day for 20.0 years (20.0 ttl pk-yrs)    Types: Cigarettes    Start date: 10/20/1972    Quit date: 10/20/1992    Years since quitting: 31.8    Passive exposure: Never   Smokeless tobacco: Never   Tobacco comments:    quit in 1994   Vaping Use   Vaping status: Never Used  Substance Use Topics   Alcohol use: Yes    Alcohol/week: 1.0 standard drink of alcohol    Types: 1 Standard drinks or equivalent per week    Comment: Occasionally at social functions   Drug use: Never    Family History Family History  Problem Relation Age of Onset   Diabetes Father        DM - and brother    Stroke Father    Cancer Mother    Coronary artery disease Paternal Grandfather    Colon cancer Neg Hx    Prostate cancer Neg Hx     Allergies  Allergen Reactions   Cilostazol Other (See Comments)    Bowel upset    Simvastatin Other (See Comments)    myalgia     REVIEW OF SYSTEMS (Negative unless checked)  Constitutional: [] Weight loss  [] Fever  [] Chills Cardiac: [] Chest pain   [] Chest pressure   [] Palpitations   [] Shortness of breath when laying flat   [] Shortness of breath with exertion. Vascular:  [x] Pain in legs with walking   [] Pain in legs at rest  [] History of DVT   [] Phlebitis   [] Swelling in legs   [] Varicose veins   [] Non-healing ulcers Pulmonary:   [] Uses home oxygen   [] Productive cough   [] Hemoptysis   [] Wheeze  [] COPD   [] Asthma Neurologic:  [] Dizziness   [] Seizures   [] History of stroke   [] History of TIA  [] Aphasia   [] Vissual changes   [] Weakness or numbness in arm   [] Weakness or numbness in leg Musculoskeletal:   [] Joint swelling   [] Joint pain   [] Low back pain Hematologic:  [] Easy bruising  [] Easy bleeding   [] Hypercoagulable state   [] Anemic Gastrointestinal:  [] Diarrhea   [] Vomiting  [] Gastroesophageal reflux/heartburn   [] Difficulty swallowing. Genitourinary:  [] Chronic kidney disease   [] Difficult urination  [] Frequent urination   [] Blood in urine Skin:  [] Rashes   [] Ulcers  Psychological:  [] History of anxiety   []  History of major depression.  Physical Examination  There were no vitals filed for this visit. There is no height or weight on file to calculate BMI. Gen: WD/WN, NAD Head: [place]/AT, No temporalis wasting.  Ear/Nose/Throat: Hearing grossly intact, nares w/o erythema or drainage Eyes: PER, EOMI, sclera nonicteric.  Neck: Supple, no masses.  No bruit or JVD.  Pulmonary:  Good air movement, no audible wheezing, no use of accessory muscles.  Cardiac: RRR, normal S1, S2, no Murmurs. Vascular:  mild trophic changes, no open wounds Vessel Right Left  Radial Palpable Palpable  PT Not Palpable Not Palpable  DP Not Palpable Not Palpable  Gastrointestinal: soft,  non-distended. No guarding/no peritoneal signs.  Musculoskeletal: M/S 5/5 throughout.  No visible deformity.   Neurologic: CN 2-12 intact. Pain and light touch intact in extremities.  Symmetrical.  Speech is fluent. Motor exam as listed above. Psychiatric: Judgment intact, Mood & affect appropriate for pt's clinical situation. Dermatologic: No rashes or ulcers noted.  No changes consistent with cellulitis.   CBC Lab Results  Component Value Date   WBC 11.0 (H) 08/16/2024   HGB 9.6 (L) 08/16/2024   HCT 30.7 (L) 08/16/2024   MCV 126.9 (H) 08/16/2024   PLT 181 08/16/2024    BMET    Component Value Date/Time   NA 135 07/28/2024 1425   K 4.4 07/28/2024 1425   CL 103 07/28/2024 1425   CO2 23 07/28/2024 1425   GLUCOSE 146 (H) 07/28/2024 1425   BUN 52 (H) 07/28/2024 1425   CREATININE 2.23 (H) 07/28/2024 1425   CALCIUM  9.0 07/28/2024 1425   GFRNONAA 28 (L) 07/28/2024 1425   GFRAA 45 (L) 10/01/2019 0327   CrCl cannot be calculated (Patient's most recent lab result is older than the maximum 21 days allowed.).  COAG Lab Results  Component Value Date   INR 1.1 03/05/2021   INR 1.3 (H) 09/16/2019   INR 1.1 09/14/2019    Radiology No results found.   Assessment/Plan There are no diagnoses linked to this encounter.   Cordella Shawl, MD  09/05/2024 1:59 PM

## 2024-09-06 ENCOUNTER — Encounter (INDEPENDENT_AMBULATORY_CARE_PROVIDER_SITE_OTHER): Payer: Self-pay | Admitting: Vascular Surgery

## 2024-09-07 LAB — VAS US ABI WITH/WO TBI
Left ABI: 0.74
Right ABI: 0.68

## 2024-09-08 ENCOUNTER — Encounter: Payer: Self-pay | Admitting: Physical Medicine & Rehabilitation

## 2024-09-08 ENCOUNTER — Encounter: Attending: Physical Medicine & Rehabilitation | Admitting: Physical Medicine & Rehabilitation

## 2024-09-08 VITALS — BP 105/64 | HR 84 | Ht 70.0 in | Wt 180.0 lb

## 2024-09-08 DIAGNOSIS — M47817 Spondylosis without myelopathy or radiculopathy, lumbosacral region: Secondary | ICD-10-CM | POA: Insufficient documentation

## 2024-09-08 NOTE — Patient Instructions (Addendum)
 Will try PT first and if no better check MRI   VISIT SUMMARY: Today, we discussed your persistent back pain and concerns about your kidney health. We have a plan to address both issues and will monitor your progress closely.  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 The pain has been persistent and sometimes worsens, affecting your mobility. -You are referred to physical therapy at [facility]. -We will have a follow-up appointment in one month to assess your progress. -We will evaluate an MRI of your abdomen to get more information about your lumbar spine. -If physical therapy does not provide enough relief, we will consider an alternative injection.  CHRONIC KIDNEY DISEASE: You have chronic kidney disease and are concerned about the effects of medications on your kidney function. Your current medications prescribed by your nephrologist are effective. -Continue taking the medications prescribed by your nephrologist. -We will monitor your kidney function and avoid medications that could harm your kidneys.                      Contains text generated by [employer].                                 Contains text generated by [employer]bridge.  The

## 2024-09-08 NOTE — Progress Notes (Signed)
 Subjective:    Patient ID: Brandon Lewis, male    DOB: 07-13-1934, 89 y.o.   MRN: [id number]  HPI  RIght L3-4-5 RF 07/24/2024 Discussed the use of AI scribe software for clinical note transcription with the patient, who gave verbal consent to proceed.  History of Present Illness Brandon Lewis is an 89 year old male with severe spinal arthritis who presents with persistent back pain.  He experiences persistent back pain primarily on the right side at the waist level, ongoing for several years. The pain is described as nagging and significantly impacts his mobility, often necessitating the use of a cane to walk short distances. Initially, the pain was rated as 7 out of 10 before a recent injection, which provided minimal relief, reducing it to a 3 out of 10 on average. However, exacerbations occur where the pain escalates to 7 or 8 out of 10 for several hours, three to four days a week.  The pain does not typically radiate down his legs, although occasional symptoms are noted. No consistent shooting pain down the legs. He has a history of severe arthritis in his spine, which was described as moderate to severe six years ago. A procedure five years ago provided relief at that time.  He is concerned about his kidney health due to a history of kidney disease and bladder cancer. He is cautious about taking pain medications that could potentially worsen his kidney function. His nephrologist has prescribed medications that he believes are helping his kidney condition. He is particularly worried about his glomerular filtration rate (GFR) and has a family history of kidney disease requiring dialysis, which he wishes to avoid.   Pain Inventory Average Pain 3 Pain Right Now 4 My pain is intermittent, constant, sharp, burning, and stabbing  In the last 24 hours, has pain interfered with the following? General activity 5 Relation with others 5 Enjoyment of life 5 What TIME of day is  your pain at its worst? morning  and varies Sleep (in general) Good  Pain is worse with: walking and some activites Pain improves with: rest Relief from Meds: none taken  Family History  Problem Relation Age of Onset   Diabetes Father        DM - and brother    Stroke Father    Cancer Mother    Coronary artery disease Paternal Grandfather    Colon cancer Neg Hx    Prostate cancer Neg Hx    Social History   Socioeconomic History   Marital status: Married    Spouse name: Not on file   Number of children: 3   Years of education: Not on file   Highest education level: Professional school degree (e.g., MD, DDS, DVM, JD)  Occupational History   Occupation: merchant [employer] officer    Comment: Retired  Tobacco Use   Smoking status: Former    Current packs/day: 0.00    Average packs/day: 1 pack/day for 20.0 years (20.0 ttl pk-yrs)    Types: Cigarettes    Start date: 10/20/1972    Quit date: 10/20/1992    Years since quitting: 31.9    Passive exposure: Never   Smokeless tobacco: Never   Tobacco comments:    quit in 1994   Vaping Use   Vaping status: Never Used  Substance and Sexual Activity   Alcohol use: Yes    Alcohol/week: 1.0 standard drink of alcohol    Types: 1 Standard drinks or equivalent per week  Comment: Occasionally at social functions   Drug use: Never   Sexual activity: Not Currently  Other Topics Concern   Not on file  Social History Narrative   Widowed; then remarried; 3 sons.      Has a living will    Son [name] should be health care POA---with wife's input   Would want resuscitation attempts   Would accept brief trial of artificial nutrition      Pt signed designated party release form and gives Loraine Freid [phone] (home #), access to medical records. Can also leave msg on home answering machine. Cell # [phone]   Social Drivers of Health   Financial Resource Strain: Low Risk  (05/03/2024)   Overall Financial Resource Strain (CARDIA)     Difficulty of Paying Living Expenses: Not hard at all  Food Insecurity: No Food Insecurity (05/03/2024)   Hunger Vital Sign    Worried About Running Out of Food in the Last Year: Never true    Ran Out of Food in the Last Year: Never true  Transportation Needs: No Transportation Needs (05/03/2024)   PRAPARE - Administrator, Civil Service (Medical): No    Lack of Transportation (Non-Medical): No  Physical Activity: Insufficiently Active (05/03/2024)   Exercise Vital Sign    Days of Exercise per Week: 3 days    Minutes of Exercise per Session: 30 min  Stress: No Stress Concern Present (05/03/2024)   [employer] of Occupational Health - Occupational Stress Questionnaire    Feeling of Stress: Not at all  Social Connections: Moderately Isolated (05/03/2024)   Social Connection and Isolation Panel    Frequency of Communication with Friends and Family: More than three times a week    Frequency of Social Gatherings with Friends and Family: Once a week    Attends Religious Services: Never    Database Administrator or Organizations: No    Attends Engineer, Structural: Never    Marital Status: Married   Past Surgical History:  Procedure Laterality Date   aorto-bifem  2003   [facility]    APPENDECTOMY     CAROTID STENT     cooper   CATARACT EXTRACTION, BILATERAL  2017   CHOLECYSTECTOMY  2002   COLONOSCOPY     COLONOSCOPY WITH PROPOFOL  N/A 07/17/2021   Procedure: COLONOSCOPY WITH PROPOFOL ;  Surgeon: Therisa Bi, MD;  Location: [facility] ENDOSCOPY;  Service: Gastroenterology;  Laterality: N/A;   CORONARY ARTERY BYPASS GRAFT N/A 09/16/2019   Procedure: CORONARY ARTERY BYPASS GRAFTING (CABG) using LIMA to LAD; Endoscopic right saphenous vein harvest to OM1, Diag1, and RCA.;  Surgeon: German Bartlett PEDLAR, MD;  Location: [facility] OR;  Service: Open Heart Surgery;  Laterality: N/A;   CYSTOSCOPY WITH BIOPSY N/A 10/08/2021   Procedure: CYSTOSCOPY WITH BLADDER BIOPSY;  Surgeon: Twylla Glendia BROCKS,  MD;  Location: [facility] ORS;  Service: Urology;  Laterality: N/A;   ENDARTERECTOMY FEMORAL Left 03/13/2021   Procedure: ENDARTERECTOMY FEMORAL (GROIN REVISION);  Surgeon: Jama Cordella MATSU, MD;  Location: [facility] ORS;  Service: Vascular;  Laterality: Left;   EYE SURGERY  Cataracts removed ou   LEFT HEART CATH AND CORONARY ANGIOGRAPHY N/A 09/12/2019   Procedure: LEFT HEART CATH AND CORONARY ANGIOGRAPHY;  Surgeon: Hester Wolm PARAS, MD;  Location: [facility] INVASIVE CV LAB;  Service: Cardiovascular;  Laterality: N/A;   LITHOTRIPSY     LOWER EXTREMITY ANGIOGRAPHY Left 01/15/2021   Procedure: LOWER EXTREMITY ANGIOGRAPHY;  Surgeon: Jama Cordella MATSU, MD;  Location: [facility] INVASIVE  CV LAB;  Service: Cardiovascular;  Laterality: Left;   SPINE SURGERY  1954   TEE WITHOUT CARDIOVERSION N/A 09/16/2019   Procedure: TRANSESOPHAGEAL ECHOCARDIOGRAM (TEE);  Surgeon: German Bartlett PEDLAR, MD;  Location: [facility] OR;  Service: Open Heart Surgery;  Laterality: N/A;   TONSILLECTOMY     TRANSURETHRAL RESECTION OF BLADDER TUMOR  10/08/2021   Procedure: TRANSURETHRAL RESECTION OF BLADDER TUMOR (TURBT);  Surgeon: Twylla Glendia BROCKS, MD;  Location: [facility] ORS;  Service: Urology;;   TRANSURETHRAL RESECTION OF BLADDER TUMOR WITH MITOMYCIN -C N/A 07/24/2020   Procedure: TRANSURETHRAL RESECTION OF BLADDER TUMOR WITH Gemcitabine ;  Surgeon: Twylla Glendia BROCKS, MD;  Location: [facility] ORS;  Service: Urology;  Laterality: N/A;   VASECTOMY  1976   WISDOM TOOTH EXTRACTION     Past Surgical History:  Procedure Laterality Date   aorto-bifem  2003   hayes    APPENDECTOMY     CAROTID STENT     cooper   CATARACT EXTRACTION, BILATERAL  2017   CHOLECYSTECTOMY  2002   COLONOSCOPY     COLONOSCOPY WITH PROPOFOL  N/A 07/17/2021   Procedure: COLONOSCOPY WITH PROPOFOL ;  Surgeon: Therisa Bi, MD;  Location: [facility] ENDOSCOPY;  Service: Gastroenterology;  Laterality: N/A;   CORONARY ARTERY BYPASS GRAFT N/A 09/16/2019   Procedure: CORONARY ARTERY BYPASS GRAFTING  (CABG) using LIMA to LAD; Endoscopic right saphenous vein harvest to OM1, Diag1, and RCA.;  Surgeon: German Bartlett PEDLAR, MD;  Location: [facility] OR;  Service: Open Heart Surgery;  Laterality: N/A;   CYSTOSCOPY WITH BIOPSY N/A 10/08/2021   Procedure: CYSTOSCOPY WITH BLADDER BIOPSY;  Surgeon: Twylla Glendia BROCKS, MD;  Location: [facility] ORS;  Service: Urology;  Laterality: N/A;   ENDARTERECTOMY FEMORAL Left 03/13/2021   Procedure: ENDARTERECTOMY FEMORAL (GROIN REVISION);  Surgeon: Jama Cordella MATSU, MD;  Location: [facility] ORS;  Service: Vascular;  Laterality: Left;   EYE SURGERY  Cataracts removed ou   LEFT HEART CATH AND CORONARY ANGIOGRAPHY N/A 09/12/2019   Procedure: LEFT HEART CATH AND CORONARY ANGIOGRAPHY;  Surgeon: Hester Wolm PARAS, MD;  Location: [facility] INVASIVE CV LAB;  Service: Cardiovascular;  Laterality: N/A;   LITHOTRIPSY     LOWER EXTREMITY ANGIOGRAPHY Left 01/15/2021   Procedure: LOWER EXTREMITY ANGIOGRAPHY;  Surgeon: Jama Cordella MATSU, MD;  Location: [facility] INVASIVE CV LAB;  Service: Cardiovascular;  Laterality: Left;   SPINE SURGERY  1954   TEE WITHOUT CARDIOVERSION N/A 09/16/2019   Procedure: TRANSESOPHAGEAL ECHOCARDIOGRAM (TEE);  Surgeon: German Bartlett PEDLAR, MD;  Location: [facility] OR;  Service: Open Heart Surgery;  Laterality: N/A;   TONSILLECTOMY     TRANSURETHRAL RESECTION OF BLADDER TUMOR  10/08/2021   Procedure: TRANSURETHRAL RESECTION OF BLADDER TUMOR (TURBT);  Surgeon: Twylla Glendia BROCKS, MD;  Location: [facility] ORS;  Service: Urology;;   TRANSURETHRAL RESECTION OF BLADDER TUMOR WITH MITOMYCIN -C N/A 07/24/2020   Procedure: TRANSURETHRAL RESECTION OF BLADDER TUMOR WITH Gemcitabine ;  Surgeon: Twylla Glendia BROCKS, MD;  Location: [facility] ORS;  Service: Urology;  Laterality: N/A;   VASECTOMY  1976   WISDOM TOOTH EXTRACTION     Past Medical History:  Diagnosis Date   Allergy side effects- NKDA   Anemia    Aortic atherosclerosis    Atrial flutter, paroxysmal (HCC)    a.) postoperatively following CABG    Bladder cancer (HCC) 04/2020   BPH (benign prostatic hyperplasia)    CAD (coronary artery disease) 09/12/2019   a.) LHC 09/12/2019: EF 55-60%; 85% m-dLM, 50% pLCx, 85% p-m LAD, 70% pLAD, 45% mLAD, 70% p-mRCA. b.) 4v CABG 09/16/2019.  CKD (chronic kidney disease), stage III (HCC)    Diabetes mellitus without complication (HCC)    Diverticulosis    GERD (gastroesophageal reflux disease)    History of kidney stones    HTN (hypertension)    Hyperlipidemia    Long term current use of antithrombotics/antiplatelets    a.) DAPT therapies (ASA + clopidogrel )   NSTEMI (non-ST elevated myocardial infarction) (HCC) 09/11/2019   a.) LHC 09/12/2019: 85% m-dLM, 50% pLCx, 85% p-m LAD, 70% pLAD, 45% mLAD, 70% p-mRCA; transferred to [facility] for [facility] consult. Underwent 4v CABG on 09/16/2019.   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  Opioid Risk Score:   Fall Risk Score:  `1  Depression screen PHQ 2/9     05/24/2024    2:06 PM 05/03/2024    2:22 PM 04/21/2024    1:07 PM 04/07/2024   12:27 PM 03/07/2024    2:09 PM 09/11/2023   12:29 PM 09/11/2023   11:47 AM  Depression screen PHQ 2/9  Decreased Interest 0 0 0 0 0 0 0  Down, Depressed, Hopeless 0 0 0 0 0 0 0  PHQ - 2 Score 0 0 0 0 0 0 0  Altered sleeping   0      Tired, decreased energy   0      Change in appetite   0      Feeling bad or failure about yourself    0      Trouble concentrating   0      Moving slowly or fidgety/restless   0      Suicidal thoughts   0      PHQ-9 Score   0          Data  saved with a previous flowsheet row definition    Review of Systems  Musculoskeletal:  Positive for back pain and gait problem.  All other systems reviewed and are negative.      Objective:   Physical Exam Vitals and nursing note reviewed.  Constitutional:      Appearance: He is obese.  HENT:     Head: Normocephalic and atraumatic.  Eyes:     Extraocular Movements: Extraocular movements intact.     Conjunctiva/sclera: Conjunctivae normal.     Pupils: Pupils are equal, round, and reactive to light.  Neurological:     General: No focal deficit present.     Mental Status: He is alert and oriented to person, place, and time.     Gait: Gait normal.  Psychiatric:        Mood and Affect: Mood normal.        Behavior: Behavior normal.    Loss of balance noted during lumbar spine range of motion testing in the standing position.  Patient tried leaning backward and he started to fall backward but was able to be propped up with just 1 hand assistance. Motor strength is 5/5 bilateral hip flexor knee extensor ankle dorsiflexor Negative straight leg raising bilaterally Lumbar  range of motion reduced with extension he can get to neutral flexion gets about 50% range lateral bending 75% range bilaterally.  He has more pain with extension than with flexion.       Assessment & Plan:  Assessment and Plan Assessment & Plan Lumbar spondylosis with chronic low back pain and impaired mobility Chronic right-sided low back pain with intermittent exacerbations, likely due to lumbar spondylosis progression. Previous injection provided limited relief. No significant radicular symptoms. Concerns about medication side effects on mobility and kidney function. - Referred to physical therapy at [facility]. - Scheduled follow-up in one month to assess progress. - Evaluate MRI of the abdomen for lumbar spine information. - Consider alternative injection if physical therapy is inadequate.  Chronic  kidney disease Concerns about medication side effects on kidney function. Current nephrologist-prescribed medications are effective. Strong desire to avoid dialysis due to family history. - Continue nephrologist-prescribed medications. - Monitor kidney function and avoid nephrotoxic medications. We discussed that his preinjection pain levels were averaging 7 out of 10 and now at 3 out of 10 which is about what we would expect from the procedure.  We would hope to get a 5-month relief with this.  He is 3 months into it. Also we discussed that other potential pain generators may exist.  Will reassess after physical therapy in 1 month

## 2024-09-12 ENCOUNTER — Encounter: Admitting: General Practice

## 2024-09-13 ENCOUNTER — Ambulatory Visit

## 2024-09-13 ENCOUNTER — Ambulatory Visit: Payer: Self-pay

## 2024-09-13 ENCOUNTER — Encounter

## 2024-09-13 ENCOUNTER — Telehealth (INDEPENDENT_AMBULATORY_CARE_PROVIDER_SITE_OTHER): Payer: Self-pay | Admitting: Vascular Surgery

## 2024-09-13 VITALS — BP 102/60 | HR 72 | Temp 97.8°F | Ht 70.0 in | Wt 179.0 lb

## 2024-09-13 DIAGNOSIS — N1832 Chronic kidney disease, stage 3b: Secondary | ICD-10-CM

## 2024-09-13 DIAGNOSIS — E785 Hyperlipidemia, unspecified: Secondary | ICD-10-CM | POA: Diagnosis not present

## 2024-09-13 DIAGNOSIS — Z23 Encounter for immunization: Secondary | ICD-10-CM | POA: Diagnosis not present

## 2024-09-13 DIAGNOSIS — G629 Polyneuropathy, unspecified: Secondary | ICD-10-CM | POA: Diagnosis not present

## 2024-09-13 DIAGNOSIS — Z7984 Long term (current) use of oral hypoglycemic drugs: Secondary | ICD-10-CM

## 2024-09-13 DIAGNOSIS — E1121 Type 2 diabetes mellitus with diabetic nephropathy: Secondary | ICD-10-CM

## 2024-09-13 LAB — POCT GLYCOSYLATED HEMOGLOBIN (HGB A1C): Hemoglobin A1C: 5.5 % (ref 4.0–5.6)

## 2024-09-13 NOTE — Progress Notes (Signed)
 Subjective:   This visit was conducted in person. The patient gave informed consent to the use of Abridge AI technology to record the contents of the encounter as documented below.   Patient ID: Brandon Lewis, male    DOB: October 10, 1934, 89 y.o.   MRN: [id number]   Brandon Lewis is a very pleasant 89 y.o. male who presents today for DM visit.  Discussed the use of AI scribe software for clinical note transcription with the patient, who gave verbal consent to proceed.  History of Present Illness Brandon Lewis is an 89 year old male with chronic kidney disease and coronary artery disease who presents for a comprehensive health review and management of his kidney concerns.  He has a recent diagnosis of chronic kidney disease and is concerned about his lab results, particularly a hemoglobin level of less than 10 on two occasions, with the most recent being 9.6. He experiences a lack of energy compared to his usual state.  He has a history of coronary artery disease and has undergone multiple coronary bypass surgeries. He is currently taking metoprolol  50 mg daily and losartan  25 mg twice daily, which was started by his nephrologist about three to four months ago.  He has a history of bladder cancer, currently in remission following BCG treatment, and reports no symptoms related to this condition.  He experiences significant sciatica pain, which he believes may be related to arthritis. He has severe arthritis and is not taking any NSAIDs due to their potential impact on his kidneys.  He manages diabetes with diet and medication, taking metformin  500 mg twice daily and Farxiga 5 mg daily, which was started by his nephrologist. His diabetes is well-controlled, with a recent A1c of 5.5.  He takes several medications: Flomax  daily for prostate issues, Protonix  as needed for acid reflux, turmeric and magnesium  over the counter, and low-dose aspirin  daily. He uses metronidazole   gel occasionally for skin issues and Flonase  very seldom.  He mentions a recent vascular test indicating a blockage, possibly due to scar tissue from previous surgeries, and is scheduled for an MRI next week to investigate further.  He reports severe hip pain, which is intermittent but can be debilitating, affecting his ability to walk and perform regular exercise. He is undergoing rehabilitation and awaiting reassessment for potential injections to manage the pain.  He is a retired teacher, early years/pre and is cautious about medication use, particularly avoiding NSAIDs due to their impact on kidney function.  -Denies polydipsia, polyuria, weight loss, fatigue or any LE wounds.  Diet: Improving Weight concerns: No   Review of Systems  All other systems reviewed and are negative.        Past Medical History:  Diagnosis Date   Allergy side effects- NKDA   Anemia    Aortic atherosclerosis    Atrial flutter, paroxysmal (HCC)    a.) postoperatively following CABG   Bladder cancer (HCC) 04/2020   BPH (benign prostatic hyperplasia)    CAD (coronary artery disease) 09/12/2019   a.) LHC 09/12/2019: EF 55-60%; 85% m-dLM, 50% pLCx, 85% p-m LAD, 70% pLAD, 45% mLAD, 70% p-mRCA. b.) 4v CABG 09/16/2019.   CKD (chronic kidney disease), stage III (HCC)    Diabetes mellitus without complication (HCC)    Diverticulosis    GERD (gastroesophageal reflux disease)    History of kidney stones    HTN (hypertension)    Hyperlipidemia    Long term current use of antithrombotics/antiplatelets    a.)  DAPT therapies (ASA + clopidogrel )   NSTEMI (non-ST elevated myocardial infarction) (HCC) 09/11/2019   a.) LHC 09/12/2019: 85% m-dLM, 50% pLCx, 85% p-m LAD, 70% pLAD, 45% mLAD, 70% p-mRCA; transferred to [facility] for CVTS consult. Underwent 4v CABG on 09/16/2019.   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   a.) 4v CABG: LIMA-LAD, SVG-RCA, SVG-OM1, SVG-D1   Small bowel obstruction (HCC)    after surgeries   Thoracic ascending aortic aneurysm    a.) measured 4.1 cm by CT on 09/14/2019    Social History   Socioeconomic History   Marital status: Married    Spouse name: Not on file   Number of children: 3   Years of education: Not on file   Highest education level: Professional school degree (e.g., MD, DDS, DVM, JD)  Occupational History   Occupation: merchant navy officer    Comment: Retired  Tobacco Use   Smoking status: Former    Current packs/day: 0.00    Average packs/day: 1 pack/day for 20.0 years (20.0 ttl pk-yrs)    Types: Cigarettes    Start date: 10/20/1972    Quit date: 10/20/1992    Years since quitting: 31.9    Passive exposure: Never   Smokeless tobacco: Never   Tobacco comments:    quit in 1994   Vaping Use   Vaping status: Never Used  Substance and Sexual Activity   Alcohol use: Yes    Alcohol/week: 1.0 standard drink of alcohol    Types: 1 Standard drinks or equivalent per week    Comment: Occasionally at social functions   Drug use: Never   Sexual activity: Not Currently  Other Topics Concern   Not on file  Social History Narrative   Widowed; then remarried; 3 sons.      Has a living will    Son [name] should be health care POA---with wife's input   Would want resuscitation attempts   Would accept brief trial of artificial nutrition      Pt signed designated party release form and gives Cavion Faiola [phone] (home #), access to medical records. Can also leave msg on home answering machine. Cell # [phone]   Social Drivers of Health   Financial Resource Strain: Low Risk  (09/12/2024)   Overall Financial Resource Strain (CARDIA)    Difficulty of Paying Living Expenses: Not very hard  Food Insecurity: No Food Insecurity (09/12/2024)   Hunger  Vital Sign    Worried About Running Out of Food in the Last Year: Never true    Ran Out of Food in the Last Year: Never true  Transportation Needs: No Transportation Needs (09/12/2024)   PRAPARE - Administrator, Civil Service (Medical): No    Lack of Transportation (Non-Medical): No  Physical Activity: Insufficiently Active (09/12/2024)   Exercise Vital Sign    Days of Exercise per Week: 1 day    Minutes of Exercise per Session: 30 min  Stress: No Stress Concern Present (09/12/2024)   [employer] of Occupational Health - Occupational Stress Questionnaire    Feeling of Stress: Not at all  Social Connections: Socially Integrated (09/12/2024)   Social Connection and Isolation Panel    Frequency of Communication with Friends and Family: More than three times a  week    Frequency of Social Gatherings with Friends and Family: Twice a week    Attends Religious Services: 1 to 4 times per year    Active Member of [employer] or Organizations: Yes    Attends Banker Meetings: 1 to 4 times per year    Marital Status: Married  Catering Manager Violence: Not At Risk (05/03/2024)   Humiliation, Afraid, Rape, and Kick questionnaire    Fear of Current or Ex-Partner: No    Emotionally Abused: No    Physically Abused: No    Sexually Abused: No    Past Surgical History:  Procedure Laterality Date   aorto-bifem  2003   [facility]    APPENDECTOMY     CAROTID STENT     [facility]   CATARACT EXTRACTION, BILATERAL  2017   CHOLECYSTECTOMY  2002   COLONOSCOPY     COLONOSCOPY WITH PROPOFOL  N/A 07/17/2021   Procedure: COLONOSCOPY WITH PROPOFOL ;  Surgeon: Therisa Bi, MD;  Location: [facility] ENDOSCOPY;  Service: Gastroenterology;  Laterality: N/A;   CORONARY ARTERY BYPASS GRAFT N/A 09/16/2019   Procedure: CORONARY ARTERY BYPASS GRAFTING (CABG) using LIMA to LAD; Endoscopic right saphenous vein harvest to OM1, Diag1, and RCA.;  Surgeon: German Bartlett PEDLAR, MD;  Location: [facility] OR;  Service: Open  Heart Surgery;  Laterality: N/A;   CYSTOSCOPY WITH BIOPSY N/A 10/08/2021   Procedure: CYSTOSCOPY WITH BLADDER BIOPSY;  Surgeon: Twylla Glendia BROCKS, MD;  Location: [facility] ORS;  Service: Urology;  Laterality: N/A;   ENDARTERECTOMY FEMORAL Left 03/13/2021   Procedure: ENDARTERECTOMY FEMORAL (GROIN REVISION);  Surgeon: Jama Cordella MATSU, MD;  Location: [facility] ORS;  Service: Vascular;  Laterality: Left;   EYE SURGERY  Cataracts removed ou   LEFT HEART CATH AND CORONARY ANGIOGRAPHY N/A 09/12/2019   Procedure: LEFT HEART CATH AND CORONARY ANGIOGRAPHY;  Surgeon: Hester Wolm PARAS, MD;  Location: [facility] INVASIVE CV LAB;  Service: Cardiovascular;  Laterality: N/A;   LITHOTRIPSY     LOWER EXTREMITY ANGIOGRAPHY Left 01/15/2021   Procedure: LOWER EXTREMITY ANGIOGRAPHY;  Surgeon: Jama Cordella MATSU, MD;  Location: [facility] INVASIVE CV LAB;  Service: Cardiovascular;  Laterality: Left;   SPINE SURGERY  1954   TEE WITHOUT CARDIOVERSION N/A 09/16/2019   Procedure: TRANSESOPHAGEAL ECHOCARDIOGRAM (TEE);  Surgeon: German Bartlett PEDLAR, MD;  Location: [facility] OR;  Service: Open Heart Surgery;  Laterality: N/A;   TONSILLECTOMY     TRANSURETHRAL RESECTION OF BLADDER TUMOR  10/08/2021   Procedure: TRANSURETHRAL RESECTION OF BLADDER TUMOR (TURBT);  Surgeon: Twylla Glendia BROCKS, MD;  Location: [facility] ORS;  Service: Urology;;   TRANSURETHRAL RESECTION OF BLADDER TUMOR WITH MITOMYCIN -C N/A 07/24/2020   Procedure: TRANSURETHRAL RESECTION OF BLADDER TUMOR WITH Gemcitabine ;  Surgeon: Twylla Glendia BROCKS, MD;  Location: [facility] ORS;  Service: Urology;  Laterality: N/A;   VASECTOMY  1976   WISDOM TOOTH EXTRACTION      Family History  Problem Relation Age of Onset   Cancer Mother    Diabetes Father        DM - and brother    Stroke Father    Coronary artery disease Paternal Grandfather    Colon cancer Neg Hx    Prostate cancer Neg Hx     Allergies  Allergen Reactions   Cilostazol Other (See Comments)    Bowel upset   Simvastatin Other (See  Comments)    myalgia    Current Outpatient Medications on File Prior to Visit  Medication Sig Dispense Refill   aspirin  EC 81 MG tablet  Take 81 mg by mouth in the morning. Swallow whole.     calcium  carbonate (TUMS - DOSED IN MG ELEMENTAL CALCIUM ) 500 MG chewable tablet Chew 1,000 mg by mouth daily as needed for indigestion or heartburn.     Cholecalciferol  (VITAMIN D ) 50 MCG (2000 UT) CAPS Take 2,000 Units by mouth in the morning.     FARXIGA 5 MG TABS tablet Take 5 mg by mouth daily.     fluticasone  (FLONASE ) 50 MCG/ACT nasal spray Place 2 sprays into both nostrils daily. 16 g 11   furosemide  (LASIX ) 40 MG tablet Take 40 mg by mouth.     hydrocortisone  (ANUSOL -HC) 2.5 % rectal cream Place 1 application rectally daily as needed for hemorrhoids or anal itching.     losartan  (COZAAR ) 25 MG tablet Take 25 mg by mouth daily.     MAGNESIUM  GLYCINATE PO Take 500 mg by mouth every evening.     metFORMIN  (GLUCOPHAGE -XR) 500 MG 24 hr tablet * VIAL* TAKE 2 TABLETS (1000MG ) BY MOUTH ONCE DAILY WITH BREAKFAST *TAKE WITH FOOD* *DO NOT CRUSH OR CHEW* *BOTTLE* 60 tablet 1   metoprolol  succinate (TOPROL -XL) 50 MG 24 hr tablet **VIAL ONLY* TAKE (1) TABLET BY MOUTH ONCE A DAY.DO NOT CRUSH. (HIGHBLOOD PRESSURE)  *DO NOT CRUSH OR CHEW* 30 tablet 11   metroNIDAZOLE  (METROGEL ) 0.75 % gel APPLY 1 APPLICATION TOPICALLY TWICE DAILY 45 g 11   Multiple Vitamin (MULTIVITAMIN WITH MINERALS) TABS tablet Take 1 tablet by mouth daily. Centrum Silver     NEXLIZET 180-10 MG TABS Take 1 tablet by mouth daily.     pantoprazole  (PROTONIX ) 40 MG tablet Take 1 tablet (40 mg total) by mouth daily as needed (indigestion/heartburn). 90 tablet 3   sodium chloride  (OCEAN) 0.65 % nasal spray Place 1 spray into the nose daily as needed (sinus congestion).     tamsulosin  (FLOMAX ) 0.4 MG CAPS capsule TAKE 1 CAPSULE BY MOUTH EVERY DAY 90 capsule 3   TURMERIC CURCUMIN PO Take 2 tablets by mouth daily.     No current facility-administered  medications on file prior to visit.    BP 102/60 (BP Location: Left Arm, Patient Position: Sitting, Cuff Size: Large)   Pulse 72   Temp 97.8 F (36.6 C) (Oral)   Ht 5' 10 (1.778 m)   Wt 179 lb (81.2 kg)   BMI 25.68 kg/m   Objective:     Physical Exam GENERAL: Alert, cooperative, well developed, no acute distress. HEAD: Normocephalic atraumatic. EYES: Extraocular movements intact bilaterally, pupils round, equal and reactive to light bilaterally, conjunctivae normal bilaterally. EXTREMITIES: No cyanosis or edema. Thick toenails bilaterally. NEUROLOGICAL: Oriented to person, place and time, no gait abnormalities, moves all extremities without gross motor deficit. Decreased sensation in left foot, neuropathy in feet.     Physical Exam Vitals and nursing note reviewed.  Constitutional:      Appearance: Normal appearance.  Cardiovascular:     Pulses:          Dorsalis pedis pulses are 2+ on the right side and 2+ on the left side.       Posterior tibial pulses are 2+ on the right side and 2+ on the left side.  Feet:     Right foot:     Protective Sensation: 10 sites tested.  8 sites sensed.     Skin integrity: Skin integrity normal.     Toenail Condition: Right toenails are abnormally thick.     Left foot:  Protective Sensation: 10 sites tested.  5 sites sensed.     Skin integrity: Skin integrity normal.     Toenail Condition: Left toenails are abnormally thick.  Neurological:     Mental Status: He is alert.          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 Diabetic neuropathy with decreased sensation in left foot, no significant symptoms. Will r/o B6 deficiency as a contributing factor, recent B12 level WNL.  Current A1c: 5.5 Last A1c: 5.9 Med regimen: Metformin , Farxiga Urine microalbumin: Done Eye exam: UTD, At [facility]  Foot exam: Done Ace-i/ARB:  Yes  Statin: Did not tolerate so on Nexlizet Influenza and Pneumococcal vaccines: Flu and prevnar given this visit Neuropathy: Yes Nephropathy: Yes, follows with nephro Retinopathy: No   - Continue metformin  1000 mg daily. - Encouraged dietary modifications: lean meats, small portions of sweets. - Check urine microalbumin today. - Schedule fasting labs for cholesterol and B6 in a week. - Follow up in 6 months for diabetic visit.  Chronic kidney disease, stage 4 with anemia Stage 4 CKD with GFR 28. Anemia with hemoglobin 9-9.6, asymptomatic. Retacrit would be of benefit since hemoglobin <10, he has upcoming appt with nephrology. Lasix  and Farxiga may affect GFR but are kidney protective. BP well controlled, no changes to regimen, within goal of less than 130/80 given CKD.  - Continue losartan , Farxiga, and Lasix . - Ensure hydration: 30-40 oz water  daily. - Monitor hemoglobin. - Follow up with nephrologist January 2026.  Hyperlipidemia Managed with Nexlizet due to statin intolerance. - Continue Nexlizet as prescribed. - Lipid panel ordered     Return in about 3 weeks (around 10/04/2024) for CPE with fasting labs 1wk prior, then 85mo for a diabetic visit .  Sheela Mcculley K Erick Oxendine, MD  09/13/24    Contains text generated by [employer].

## 2024-09-13 NOTE — Telephone Encounter (Signed)
 Lolita from Pre-service center called and stated that the code was incorrect for the MR ANGIO ABDOMEN WO CONTRAST. The code that was used originally was [phone]. Lolita stated that the correct code is [phone]. [facility] obtained new Auth for this code.

## 2024-09-13 NOTE — Patient Instructions (Addendum)
 Thank you for visiting [facility] today! Here's what we talked about: - START drinking 30-40oz of water , goal BP is less than 130/80 - Get covid vaccine at pharmacy

## 2024-09-14 ENCOUNTER — Encounter: Payer: Self-pay | Admitting: Urology

## 2024-09-14 ENCOUNTER — Ambulatory Visit: Admitting: Urology

## 2024-09-14 VITALS — BP 113/67 | HR 111 | Ht 70.0 in | Wt 177.0 lb

## 2024-09-14 DIAGNOSIS — C679 Malignant neoplasm of bladder, unspecified: Secondary | ICD-10-CM

## 2024-09-14 LAB — URINALYSIS, COMPLETE
Bilirubin, UA: NEGATIVE
Ketones, UA: NEGATIVE
Nitrite, UA: NEGATIVE
Protein,UA: NEGATIVE
RBC, UA: NEGATIVE
Specific Gravity, UA: 1.01 (ref 1.005–1.030)
Urobilinogen, Ur: 0.2 mg/dL (ref 0.2–1.0)
pH, UA: 6 (ref 5.0–7.5)

## 2024-09-14 LAB — MICROSCOPIC EXAMINATION: WBC, UA: >30 /HPF — AB (ref 0–5)

## 2024-09-14 LAB — MICROALBUMIN / CREATININE URINE RATIO
Creatinine,U: 82.7 mg/dL
Microalb Creat Ratio: 46.9 mg/g — ABNORMAL HIGH (ref 0.0–30.0)
Microalb, Ur: 3.9 mg/dL — ABNORMAL HIGH (ref 0.0–1.9)

## 2024-09-14 MED ORDER — SULFAMETHOXAZOLE-TRIMETHOPRIM 800-160 MG PO TABS
1.0000 | ORAL_TABLET | Freq: Once | ORAL | Status: AC
  Administered 2024-09-14: 1 via ORAL

## 2024-09-14 NOTE — Progress Notes (Signed)
   09/14/24  CC:  Chief Complaint  Patient presents with   Cysto    Urologic history: 1.  Ta urothelial carcinoma bladder, high-grade TURBT 07/24/2020; ~ 2 cm right posterior wall tumor Induction BCG completed 11/09/2020 which was tolerated well Maintenance BCG (received only 1 dose May 2022 and doses 2/22 May 2021 due to shortage TURBT 10/08/2021 for abnormal mucosa early papillary change just inside the left bladder neck; Path high-grade Ta Reinduction BCG x6 completed 01/10/2022 Maintenance BCG x3 completed: 04/15/2022, 11/08/2022, 05/26/2023, 12/24/2023, 08/02/2024   HPI: Brandon Lewis has no complaints today.  Denies gross hematuria.  UA >30 RBC, nitrite negative.  He is asymptomatic  See rooming tab for vitals  Cystoscopy Procedure Note  Patient identification was confirmed, informed consent was obtained, and patient was prepped using Betadine solution.  Lidocaine  jelly was administered per urethral meatus.     Pre-Procedure: - Inspection reveals a normal caliber urethral meatus.  Procedure: The flexible cystoscope was introduced without difficulty - No urethral strictures/lesions are present. - Moderate lateral lobe enlargement prostate  - Mild elevation bladder neck - Bilateral ureteral orifices identified - Bladder mucosa without erythema, solid or papillary lesions - No bladder stones - Mild trabeculation  Retroflexion shows no abnormalities/tumor   Post-Procedure: - Patient tolerated the procedure well  Assessment/ Plan: No endoscopic evidence recurrent urothelial carcinoma Septra  DS 1 tab given post cystoscopy Urine culture ordered Surveillance cystoscopy 6 months    Glendia JAYSON Barba, MD

## 2024-09-16 ENCOUNTER — Ambulatory Visit
Admission: RE | Admit: 2024-09-16 | Discharge: 2024-09-16 | Disposition: A | Discharge status: Home / Self Care | Source: Ambulatory Visit | Attending: Vascular Surgery | Admitting: Vascular Surgery

## 2024-09-16 DIAGNOSIS — K5730 Diverticulosis of large intestine without perforation or abscess without bleeding: Secondary | ICD-10-CM | POA: Diagnosis not present

## 2024-09-16 DIAGNOSIS — N281 Cyst of kidney, acquired: Secondary | ICD-10-CM | POA: Insufficient documentation

## 2024-09-16 DIAGNOSIS — Z95820 Peripheral vascular angioplasty status with implants and grafts: Secondary | ICD-10-CM | POA: Diagnosis not present

## 2024-09-16 DIAGNOSIS — I70219 Atherosclerosis of native arteries of extremities with intermittent claudication, unspecified extremity: Secondary | ICD-10-CM

## 2024-09-16 DIAGNOSIS — I700 Atherosclerosis of aorta: Secondary | ICD-10-CM | POA: Diagnosis present

## 2024-09-18 ENCOUNTER — Ambulatory Visit: Payer: Self-pay | Admitting: Urology

## 2024-09-18 LAB — CULTURE, URINE COMPREHENSIVE

## 2024-09-22 NOTE — Telephone Encounter (Signed)
 Patient called in today and states he started having a lot of burning with urination in the last two days. I talked with him on Monday and he states he was not having any symptoms. He use [facility].

## 2024-09-23 ENCOUNTER — Other Ambulatory Visit: Payer: Self-pay | Admitting: *Deleted

## 2024-09-23 MED ORDER — SULFAMETHOXAZOLE-TRIMETHOPRIM 800-160 MG PO TABS: 1.0000 | ORAL_TABLET | Freq: Two times a day (BID) | ORAL | 0 refills | Status: AC

## 2024-09-26 ENCOUNTER — Ambulatory Visit: Attending: Physical Medicine & Rehabilitation

## 2024-09-26 DIAGNOSIS — M545 Low back pain, unspecified: Secondary | ICD-10-CM | POA: Diagnosis present

## 2024-09-26 DIAGNOSIS — G8929 Other chronic pain: Secondary | ICD-10-CM | POA: Insufficient documentation

## 2024-09-26 DIAGNOSIS — R262 Difficulty in walking, not elsewhere classified: Secondary | ICD-10-CM | POA: Insufficient documentation

## 2024-09-26 DIAGNOSIS — M47817 Spondylosis without myelopathy or radiculopathy, lumbosacral region: Secondary | ICD-10-CM | POA: Diagnosis not present

## 2024-09-26 NOTE — Therapy (Signed)
 OUTPATIENT PHYSICAL THERAPY EVALUATION  Patient Name: Brandon Lewis MRN: [id number] DOB:[birth year], 89 y.o., male Today's Date: 09/26/2024  END OF SESSION:  PT End of Session - 09/26/24 1627     Visit Number 1    Number of Visits 12    Date for Recertification  11/07/24    Authorization Type [employer]    Authorization Time Period 09/26/24-11/07/24    Progress Note Due on Visit 10    PT Start Time 1531    PT Stop Time 1615    PT Time Calculation (min) 44 min    Activity Tolerance Patient tolerated treatment well;No increased pain    Behavior During Therapy WFL for tasks assessed/performed          Past Medical History:  Diagnosis Date   Allergy side effects- NKDA   Anemia    Aortic atherosclerosis    Atrial flutter, paroxysmal (HCC)    a.) postoperatively following CABG   Bladder cancer (HCC) 04/2020   BPH (benign prostatic hyperplasia)    CAD (coronary artery disease) 09/12/2019   a.) LHC 09/12/2019: EF 55-60%; 85% m-dLM, 50% pLCx, 85% p-m LAD, 70% pLAD, 45% mLAD, 70% p-mRCA. b.) 4v CABG 09/16/2019.   CKD (chronic kidney disease), stage III (HCC)    Diabetes mellitus without complication (HCC)    Diverticulosis    GERD (gastroesophageal reflux disease)    History of kidney stones    HTN (hypertension)    Hyperlipidemia    Long term current use of antithrombotics/antiplatelets    a.) DAPT therapies (ASA + clopidogrel )   NSTEMI (non-ST elevated myocardial infarction) (HCC) 09/11/2019   a.) LHC 09/12/2019: 85% m-dLM, 50% pLCx, 85% p-m LAD, 70% pLAD, 45% mLAD, 70% p-mRCA; transferred to [facility] for [facility] consult. Underwent 4v CABG on 09/16/2019.   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   a.) 4v CABG: LIMA-LAD, SVG-RCA, SVG-OM1, SVG-D1   Small  bowel obstruction (HCC)    after surgeries   Thoracic ascending aortic aneurysm    a.) measured 4.1 cm by CT on 09/14/2019   Past Surgical History:  Procedure Laterality Date   aorto-bifem  2003   [facility]    APPENDECTOMY     CAROTID STENT     [facility]   CATARACT EXTRACTION, BILATERAL  2017   CHOLECYSTECTOMY  2002   COLONOSCOPY     COLONOSCOPY WITH PROPOFOL  N/A 07/17/2021   Procedure: COLONOSCOPY WITH PROPOFOL ;  Surgeon: Therisa Bi, MD;  Location: [facility] ENDOSCOPY;  Service: Gastroenterology;  Laterality: N/A;   CORONARY ARTERY BYPASS GRAFT N/A 09/16/2019   Procedure: CORONARY ARTERY BYPASS GRAFTING (CABG) using LIMA to LAD; Endoscopic right saphenous vein harvest to OM1, Diag1, and RCA.;  Surgeon: German Bartlett PEDLAR, MD;  Location: [facility] OR;  Service: Open Heart Surgery;  Laterality: N/A;   CYSTOSCOPY WITH BIOPSY N/A 10/08/2021   Procedure: CYSTOSCOPY WITH BLADDER BIOPSY;  Surgeon: Twylla Glendia BROCKS, MD;  Location: [facility] ORS;  Service: Urology;  Laterality: N/A;   ENDARTERECTOMY FEMORAL Left 03/13/2021   Procedure: ENDARTERECTOMY FEMORAL (GROIN REVISION);  Surgeon: Jama Cordella MATSU, MD;  Location: [facility] ORS;  Service: Vascular;  Laterality: Left;   EYE SURGERY  Cataracts removed ou   LEFT HEART CATH AND CORONARY ANGIOGRAPHY N/A 09/12/2019   Procedure: LEFT HEART  CATH AND CORONARY ANGIOGRAPHY;  Surgeon: Hester Wolm PARAS, MD;  Location: [facility] INVASIVE CV LAB;  Service: Cardiovascular;  Laterality: N/A;   LITHOTRIPSY     LOWER EXTREMITY ANGIOGRAPHY Left 01/15/2021   Procedure: LOWER EXTREMITY ANGIOGRAPHY;  Surgeon: Jama Cordella MATSU, MD;  Location: [facility] INVASIVE CV LAB;  Service: Cardiovascular;  Laterality: Left;   SPINE SURGERY  1954   TEE WITHOUT CARDIOVERSION N/A 09/16/2019   Procedure: TRANSESOPHAGEAL ECHOCARDIOGRAM (TEE);  Surgeon: German Bartlett PEDLAR, MD;  Location: [facility] OR;  Service: Open Heart Surgery;  Laterality: N/A;   TONSILLECTOMY     TRANSURETHRAL RESECTION OF BLADDER TUMOR   10/08/2021   Procedure: TRANSURETHRAL RESECTION OF BLADDER TUMOR (TURBT);  Surgeon: Twylla Glendia BROCKS, MD;  Location: [facility] ORS;  Service: Urology;;   TRANSURETHRAL RESECTION OF BLADDER TUMOR WITH MITOMYCIN -C N/A 07/24/2020   Procedure: TRANSURETHRAL RESECTION OF BLADDER TUMOR WITH Gemcitabine ;  Surgeon: Twylla Glendia BROCKS, MD;  Location: [facility] ORS;  Service: Urology;  Laterality: N/A;   VASECTOMY  1976   WISDOM TOOTH EXTRACTION     Patient Active Problem List   Diagnosis Date Noted   Type 2 diabetes mellitus with diabetic nephropathy, without long-term current use of insulin  (HCC) 09/13/2024   Lumbar radiculopathy 04/07/2024   Bladder cancer (HCC) 05/22/2021   Anemia, chronic disease 05/22/2021   Aortic atherosclerosis 05/17/2020   BPH (benign prostatic hyperplasia) 11/16/2019   S/P CABG x 4 11/04/2019   Atrial flutter (HCC) 11/04/2019   Allergic rhinitis 09/12/2019   Coronary artery disease due to lipid rich plaque    Stage 3b chronic kidney disease (HCC) 09/14/2018   BCC (basal cell carcinoma), face 04/12/2018   Actinic keratosis 08/14/2015   Advanced directives, counseling/discussion 07/25/2014   Hyperlipemia 03/02/2009   Essential hypertension, benign 09/22/2007   PAD (peripheral artery disease) 09/22/2007   Osteoarthritis, generalized 09/22/2007    PCP: Bennett Reuben POUR, MD  REFERRING PROVIDER: Carilyn [name] BRAVO, MD   REFERRING DIAG: [phone] (ICD-10-CM) - Lumbosacral spondylosis without myelopathy   Rationale for Evaluation and Treatment: Rehabilitation  THERAPY DIAG:  Difficulty in walking, not elsewhere classified  ONSET DATE:   SUBJECTIVE:                                                                                                                                                                                           SUBJECTIVE STATEMENT: Pt has pain in the right hip near the Rt posterolateral belt loop. Pt denies any migration of symptoms, although they  fluctuate in intensity, certainly worse when standing or walking. Pt reports pain as achy.   PERTINENT HISTORY:  89yoM referred to [facility]  for acute on chronic exacerbation of Rt low back pain. Pt has pain in the right hip near the Rt posterolateral belt loop. Pt denies any migration of symptoms, although they fluctuate in intensity, certainly worse when standing or walking. Pt reports pain as achy. Pt reports >5 years history without any specific inciting event. Pt has chronic AMB limitations due to claudication of the legs. Pt AMB without device at baseline, reports no falls, no balance issues.   PAIN:  Are you having pain? 2/10 pain at present; 8-9/10 constant pain a few weeks ago.   PRECAUTIONS: None  WEIGHT BEARING RESTRICTIONS: No  FALLS:  Has patient fallen in last 6 months?   OCCUPATION: Retired teacher, early years/pre   PLOF: Independent   PATIENT GOALS: Have less pain   OBJECTIVE:  Note: Objective measures were completed at Evaluation unless otherwise noted.  DIAGNOSTIC FINDINGS:  None  PATIENT SURVEYS:  None  PALPATION:  Pt has sharp pain upon palpations of gluteus medius in 3 anatomical regions near the iliac crest, and Rt anterior gluteus minimus.   GAIT: Distance walked: 139ft Assistive device utilized: none Level of assistance: Modified independent  Comments: Pain progression begins after 165ft   TREATMENT DATE 09/26/24:                                                                                                                             -Rt gluteus medius, gluteus minimus ischemic release x15 minutes, then AMB 150ft thereafter largely improved pain.   PATIENT EDUCATION:  Education details: Education on myofascial trigger point pain syndrome.  Person educated: Patient Education method: Chief Technology Officer Education comprehension: Good   HOME EXERCISE PROGRAM: Defer to visit 2.   ASSESSMENT:  CLINICAL IMPRESSION: Pt referred for ongoing pain in right  posterolateral low back/pelvis area. Assessment revealing of generally limited ROM in bilat hips, suspect chronic and high probability related to generalized DJD of hips. Noted reproduction of CC with deep palpation of multiple myofascial triggerpoints in gluteus medius/minimus, all of which lengthen and decrease in tenderness with sustained tissue stretch. Pt has better tolerance to standing and walking after treatment. Patient will benefit from skilled physical therapy intervention to reduce deficits and impairments identified in evaluation, in order to reduce pain, improve quality of life, and maximize activity tolerance for ADL, IADL, and leisure/fitness. Physical therapy will help pt achieve long and short term goals of care.    OBJECTIVE IMPAIRMENTS: Decreased knowledge of condition, decreased use of DME, decreased mobility, difficulty walking, decreased strength, decreased ROM. ACTIVITY LIMITATIONS: Lifting, standing, walking, squatting, transfers, locomotion level PARTICIPATION LIMITATIONS: Cleaning, laundry, interpersonal relationships, driving, yardwork, community activity.  PERSONAL FACTORS: Age, behavior pattern, education, past/current experiences, transportation, profession  are also affecting patient's functional outcome.  REHAB POTENTIAL: Good CLINICAL DECISION MAKING: Medium  EVALUATION COMPLEXITY: Moderate    GOALS: Goals reviewed with patient? No  SHORT TERM GOALS: Target date: 10/19/24  Pt to show >15% improvement on LEFS.  Baseline:  Goal status: INITIAL  2.  Pt to demonstrate ability to AMB >351ft without increase in pain symptoms.  Baseline:  Goal status: INITIAL  3.  Pt reports compliance with home and/or gym program for BLE strengthening and ROM.  Baseline: wants to joint Well-zone with silver sneakers  Goal status: INITIAL  LONG TERM GOALS: Target date: 11/07/24  Pt to report no increase in Rt hip pain >3/10 over most recent 7 days.  Baseline:  Goal status:  INITIAL  2.  Pt to report confidence in DC level HEP for continued hip strength and tissue maintenance.  Baseline:  Goal status: INITIAL   PLAN:  PT FREQUENCY: 1-2x/week  PT DURATION: 6 weeks  PLANNED INTERVENTIONS: 97110-Therapeutic exercises, 97530- Therapeutic activity, [id number]- Neuromuscular re-education, 97535- Self Care, [id number]- Manual therapy, [id number]- Gait training, [phone]- Orthotic Initial, [id number]- Orthotic/Prosthetic subsequent, H9716- Electrical stimulation (unattended), [phone]- Electrical stimulation (manual), [id number]- Traction (mechanical), [id number]- Ionotophoresis 4mg /ml Dexamethasone , Patient/Family education, Balance training, Stair training, Spinal manipulation, Spinal mobilization, Cognitive remediation, DME instructions, Cryotherapy, and Moist heat.  PLAN FOR NEXT SESSION: FU on pain relief from gluteal release, trial HEP for hip strength for HEP.   5:01 PM, 09/26/24 Peggye JAYSON Linear, PT, DPT Physical Therapist - [facility]  Outpatient Physical Therapy- [facility] [phone]      Cheriton C, PT 09/26/2024, 4:29 PM

## 2024-09-28 ENCOUNTER — Ambulatory Visit

## 2024-09-28 DIAGNOSIS — M545 Low back pain, unspecified: Secondary | ICD-10-CM

## 2024-09-28 DIAGNOSIS — R262 Difficulty in walking, not elsewhere classified: Secondary | ICD-10-CM

## 2024-09-28 NOTE — Therapy (Signed)
 OUTPATIENT PHYSICAL THERAPY TREATMENT  Patient Name: Brandon Lewis MRN: [id number] DOB:1934/03/07, 89 y.o., male Today's Date: 09/28/2024  END OF SESSION:  PT End of Session - 09/28/24 1111     Visit Number 2    Number of Visits 12    Date for Recertification  11/07/24    Authorization Type [employer]    Authorization Time Period 09/26/24-11/07/24    Progress Note Due on Visit 10    PT Start Time 1100    PT Stop Time 1143    PT Time Calculation (min) 43 min    Activity Tolerance Patient tolerated treatment well;No increased pain    Behavior During Therapy WFL for tasks assessed/performed           Past Medical History:  Diagnosis Date   Allergy side effects- NKDA   Anemia    Aortic atherosclerosis    Atrial flutter, paroxysmal (HCC)    a.) postoperatively following CABG   Bladder cancer (HCC) 04/2020   BPH (benign prostatic hyperplasia)    CAD (coronary artery disease) 09/12/2019   a.) LHC 09/12/2019: EF 55-60%; 85% m-dLM, 50% pLCx, 85% p-m LAD, 70% pLAD, 45% mLAD, 70% p-mRCA. b.) 4v CABG 09/16/2019.   CKD (chronic kidney disease), stage III (HCC)    Diabetes mellitus without complication (HCC)    Diverticulosis    GERD (gastroesophageal reflux disease)    History of kidney stones    HTN (hypertension)    Hyperlipidemia    Long term current use of antithrombotics/antiplatelets    a.) DAPT therapies (ASA + clopidogrel )   NSTEMI (non-ST elevated myocardial infarction) (HCC) 09/11/2019   a.) LHC 09/12/2019: 85% m-dLM, 50% pLCx, 85% p-m LAD, 70% pLAD, 45% mLAD, 70% p-mRCA; transferred to [facility] for [facility] consult. Underwent 4v CABG on 09/16/2019.   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   a.) 4v CABG: LIMA-LAD, SVG-RCA, SVG-OM1, SVG-D1    Small bowel obstruction (HCC)    after surgeries   Thoracic ascending aortic aneurysm    a.) measured 4.1 cm by CT on 09/14/2019   Past Surgical History:  Procedure Laterality Date   aorto-bifem  2003   [facility]    APPENDECTOMY     CAROTID STENT     [facility]   CATARACT EXTRACTION, BILATERAL  2017   CHOLECYSTECTOMY  2002   COLONOSCOPY     COLONOSCOPY WITH PROPOFOL  N/A 07/17/2021   Procedure: COLONOSCOPY WITH PROPOFOL ;  Surgeon: Therisa Bi, MD;  Location: [facility] ENDOSCOPY;  Service: Gastroenterology;  Laterality: N/A;   CORONARY ARTERY BYPASS GRAFT N/A 09/16/2019   Procedure: CORONARY ARTERY BYPASS GRAFTING (CABG) using LIMA to LAD; Endoscopic right saphenous vein harvest to OM1, Diag1, and RCA.;  Surgeon: German Bartlett PEDLAR, MD;  Location: [facility] OR;  Service: Open Heart Surgery;  Laterality: N/A;   CYSTOSCOPY WITH BIOPSY N/A 10/08/2021   Procedure: CYSTOSCOPY WITH BLADDER BIOPSY;  Surgeon: Twylla Glendia BROCKS, MD;  Location: [facility] ORS;  Service: Urology;  Laterality: N/A;   ENDARTERECTOMY FEMORAL Left 03/13/2021   Procedure: ENDARTERECTOMY FEMORAL (GROIN REVISION);  Surgeon: Jama Cordella MATSU, MD;  Location: [facility] ORS;  Service: Vascular;  Laterality: Left;   EYE SURGERY  Cataracts removed ou   LEFT HEART CATH AND CORONARY ANGIOGRAPHY N/A 09/12/2019   Procedure: LEFT  HEART CATH AND CORONARY ANGIOGRAPHY;  Surgeon: Hester Wolm PARAS, MD;  Location: [facility] INVASIVE CV LAB;  Service: Cardiovascular;  Laterality: N/A;   LITHOTRIPSY     LOWER EXTREMITY ANGIOGRAPHY Left 01/15/2021   Procedure: LOWER EXTREMITY ANGIOGRAPHY;  Surgeon: Jama Cordella MATSU, MD;  Location: [facility] INVASIVE CV LAB;  Service: Cardiovascular;  Laterality: Left;   SPINE SURGERY  1954   TEE WITHOUT CARDIOVERSION N/A 09/16/2019   Procedure: TRANSESOPHAGEAL ECHOCARDIOGRAM (TEE);  Surgeon: German Bartlett PEDLAR, MD;  Location: [facility] OR;  Service: Open Heart Surgery;  Laterality: N/A;   TONSILLECTOMY     TRANSURETHRAL RESECTION OF BLADDER TUMOR   10/08/2021   Procedure: TRANSURETHRAL RESECTION OF BLADDER TUMOR (TURBT);  Surgeon: Twylla Glendia BROCKS, MD;  Location: [facility] ORS;  Service: Urology;;   TRANSURETHRAL RESECTION OF BLADDER TUMOR WITH MITOMYCIN -C N/A 07/24/2020   Procedure: TRANSURETHRAL RESECTION OF BLADDER TUMOR WITH Gemcitabine ;  Surgeon: Twylla Glendia BROCKS, MD;  Location: [facility] ORS;  Service: Urology;  Laterality: N/A;   VASECTOMY  1976   WISDOM TOOTH EXTRACTION     Patient Active Problem List   Diagnosis Date Noted   Type 2 diabetes mellitus with diabetic nephropathy, without long-term current use of insulin  (HCC) 09/13/2024   Lumbar radiculopathy 04/07/2024   Bladder cancer (HCC) 05/22/2021   Anemia, chronic disease 05/22/2021   Aortic atherosclerosis 05/17/2020   BPH (benign prostatic hyperplasia) 11/16/2019   S/P CABG x 4 11/04/2019   Atrial flutter (HCC) 11/04/2019   Allergic rhinitis 09/12/2019   Coronary artery disease due to lipid rich plaque    Stage 3b chronic kidney disease (HCC) 09/14/2018   BCC (basal cell carcinoma), face 04/12/2018   Actinic keratosis 08/14/2015   Advanced directives, counseling/discussion 07/25/2014   Hyperlipemia 03/02/2009   Essential hypertension, benign 09/22/2007   PAD (peripheral artery disease) 09/22/2007   Osteoarthritis, generalized 09/22/2007    PCP: Bennett Reuben POUR, MD  REFERRING PROVIDER: Carilyn [name] BRAVO, MD   REFERRING DIAG: [phone] (ICD-10-CM) - Lumbosacral spondylosis without myelopathy   Rationale for Evaluation and Treatment: Rehabilitation  THERAPY DIAG:  Difficulty in walking, not elsewhere classified  Chronic right-sided low back pain without sciatica  ONSET DATE:   SUBJECTIVE:                                                                                                                                                                                           SUBJECTIVE STATEMENT: FROM Today: patient reports had some good relief after last session.  Reports pain today in more in the back Right side.    From EVAL: Pt has pain in the right hip near the  Rt posterolateral belt loop. Pt denies any migration of symptoms, although they fluctuate in intensity, certainly worse when standing or walking. Pt reports pain as achy.   PERTINENT HISTORY:  89yoM referred to [facility] for acute on chronic exacerbation of Rt low back pain. Pt has pain in the right hip near the Rt posterolateral belt loop. Pt denies any migration of symptoms, although they fluctuate in intensity, certainly worse when standing or walking. Pt reports pain as achy. Pt reports >5 years history without any specific inciting event. Pt has chronic AMB limitations due to claudication of the legs. Pt AMB without device at baseline, reports no falls, no balance issues.   PAIN:  Are you having pain? 2/10 pain at present; 8-9/10 constant pain a few weeks ago.   PRECAUTIONS: None  WEIGHT BEARING RESTRICTIONS: No  FALLS:  Has patient fallen in last 6 months?   OCCUPATION: Retired teacher, early years/pre   PLOF: Independent   PATIENT GOALS: Have less pain   OBJECTIVE:  Note: Objective measures were completed at Evaluation unless otherwise noted.  DIAGNOSTIC FINDINGS:  None  PATIENT SURVEYS:  None  HIP ROM:   ROM Assessment Hip 09/26/24     Right  Left   Flexion Supine 120 125  External rotation (90/90) 42 45  Internal Rotation (90/90) 22 18  Supine hip ABDCT  25 38     PALPATION:  Pt has sharp pain upon palpations of gluteus medius in 3 anatomical regions near the iliac crest, and Rt anterior gluteus minimus.   GAIT: Distance walked: 144ft Assistive device utilized: none Level of assistance: Modified independent  Comments: Pain progression begins after 142ft   TREATMENT DATE 09/28/24:                                                                                                                              Manual Therapy: Rt gluteus medius, gluteus minimus ischemic release x4  minutes Rt sided low back parapsinals/SI region- STM x 8  min Rt Sided low back paraspinal/SI Region/R gluteus med/min region- using IASTM- The Stick x 10 min   Therex:   Patient educated in some pain-free low back and gluteal strengtheing. - Supine gluteal sets (hold 5 sec) x 8 reps with leg down and straight -Supine gluteal sets (hold 5 sec) x 10 reps in hooklye position -Supine bridging 2 x 10 reps  - Supine bridging with gluteal set at top of motion x 10 reps - Sidelying Isometric gluteus medius activation (fetal position and press RLE into mat) hold 5 sec x 10 reps  Self-care/home management:  Added above activities to HEP (as seen below)   PATIENT EDUCATION:  Education details: Education on how manual techniques can relieve pain and importance of strengthening can assist with chronic pain symptoms. Person educated: Patient Education method: Chief Technology Officer Education comprehension: Good   HOME EXERCISE PROGRAM: Access Code: [email or web address] URL: [email or web address] Date: 09/28/2024 Prepared by: Reyes London  Exercises - Supine  Gluteal Sets  - 3 x weekly - 3 sets - 10 reps - Supine Bridge  - 3 x weekly - 3 sets - 10 reps - Supine Bridge with Mini Swiss Ball Between Knees  - 3 x weekly - 3 sets - 10 reps  ASSESSMENT:  CLINICAL IMPRESSION: Patient presents as motivated today and receptive to treatment. He endorsed more R sided low back pain than previous visit (more gluteus med/min pain). He was tender with numerous trigger points in low back and SI region and some tenderness still in previously mentioned gluteal region. He responded well to manual therapy stating getting some instant relief and able to participate in some pain free beginner Low back and Hip strengthening. Patient will benefit from skilled physical therapy intervention to reduce deficits and impairments identified in evaluation, in order to reduce pain, improve quality of life, and  maximize activity tolerance for ADL, IADL, and leisure/fitness. Physical therapy will help pt achieve long and short term goals of care.    OBJECTIVE IMPAIRMENTS: Decreased knowledge of condition, decreased use of DME, decreased mobility, difficulty walking, decreased strength, decreased ROM. ACTIVITY LIMITATIONS: Lifting, standing, walking, squatting, transfers, locomotion level PARTICIPATION LIMITATIONS: Cleaning, laundry, interpersonal relationships, driving, yardwork, community activity.  PERSONAL FACTORS: Age, behavior pattern, education, past/current experiences, transportation, profession  are also affecting patient's functional outcome.  REHAB POTENTIAL: Good CLINICAL DECISION MAKING: Medium  EVALUATION COMPLEXITY: Moderate    GOALS: Goals reviewed with patient? No  SHORT TERM GOALS: Target date: 10/19/24  Pt to show >15% improvement on LEFS.  Baseline: Goal status: INITIAL  2.  Pt to demonstrate ability to AMB >353ft without increase in pain symptoms.  Baseline:  Goal status: INITIAL  3.  Pt reports compliance with home and/or gym program for BLE strengthening and ROM.  Baseline: wants to joint Well-zone with silver sneakers  Goal status: INITIAL  LONG TERM GOALS: Target date: 11/07/24  Pt to report no increase in Rt hip pain >3/10 over most recent 7 days.  Baseline:  Goal status: INITIAL  2.  Pt to report confidence in DC level HEP for continued hip strength and tissue maintenance.  Baseline:  Goal status: INITIAL   PLAN:  PT FREQUENCY: 1-2x/week  PT DURATION: 6 weeks  PLANNED INTERVENTIONS: 97110-Therapeutic exercises, 97530- Therapeutic activity, [id number]- Neuromuscular re-education, 97535- Self Care, 02859- Manual therapy, [id number]- Gait training, [phone]- Orthotic Initial, [phone]- Orthotic/Prosthetic subsequent, H9716- Electrical stimulation (unattended), [phone]- Electrical stimulation (manual), [id number]- Traction (mechanical), [id number]- Ionotophoresis 4mg /ml  Dexamethasone , Patient/Family education, Balance training, Stair training, Spinal manipulation, Spinal mobilization, Cognitive remediation, DME instructions, Cryotherapy, and Moist heat.  PLAN FOR NEXT SESSION:   Follow up on how exercises went and progress as appropriate Continue with manual technique for pain relief.   6:15 PM, 09/28/24 Chyrl London, PT Physical Therapist - [facility] [phone]      Reyes LOISE London, PT 09/28/2024, 6:15 PM     6:15 PM, 09/28/24

## 2024-10-04 ENCOUNTER — Ambulatory Visit

## 2024-10-04 VITALS — BP 104/51 | HR 80

## 2024-10-04 DIAGNOSIS — G8929 Other chronic pain: Secondary | ICD-10-CM

## 2024-10-04 DIAGNOSIS — R262 Difficulty in walking, not elsewhere classified: Secondary | ICD-10-CM | POA: Diagnosis not present

## 2024-10-04 NOTE — Therapy (Signed)
 OUTPATIENT PHYSICAL THERAPY TREATMENT  Patient Name: Brandon Lewis MRN: [id number] DOB:1934-01-17, 89 y.o., male Today's Date: 10/04/2024  END OF SESSION:  PT End of Session - 10/04/24 1549     Visit Number 3    Number of Visits 12    Date for Recertification  11/07/24    Authorization Type [employer]    Authorization Time Period 09/26/24-11/07/24    Progress Note Due on Visit 10    PT Start Time 0345    PT Stop Time 0425    PT Time Calculation (min) 40 min    Activity Tolerance Patient tolerated treatment well;No increased pain    Behavior During Therapy WFL for tasks assessed/performed           Past Medical History:  Diagnosis Date   Allergy side effects- NKDA   Anemia    Aortic atherosclerosis    Atrial flutter, paroxysmal (HCC)    a.) postoperatively following CABG   Bladder cancer (HCC) 04/2020   BPH (benign prostatic hyperplasia)    CAD (coronary artery disease) 09/12/2019   a.) LHC 09/12/2019: EF 55-60%; 85% m-dLM, 50% pLCx, 85% p-m LAD, 70% pLAD, 45% mLAD, 70% p-mRCA. b.) 4v CABG 09/16/2019.   CKD (chronic kidney disease), stage III (HCC)    Diabetes mellitus without complication (HCC)    Diverticulosis    GERD (gastroesophageal reflux disease)    History of kidney stones    HTN (hypertension)    Hyperlipidemia    Long term current use of antithrombotics/antiplatelets    a.) DAPT therapies (ASA + clopidogrel )   NSTEMI (non-ST elevated myocardial infarction) (HCC) 09/11/2019   a.) LHC 09/12/2019: 85% m-dLM, 50% pLCx, 85% p-m LAD, 70% pLAD, 45% mLAD, 70% p-mRCA; transferred to [facility] for [facility] consult. Underwent 4v CABG on 09/16/2019.   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   a.) 4v CABG: LIMA-LAD, SVG-RCA, SVG-OM1, SVG-D1    Small bowel obstruction (HCC)    after surgeries   Thoracic ascending aortic aneurysm    a.) measured 4.1 cm by CT on 09/14/2019   Past Surgical History:  Procedure Laterality Date   aorto-bifem  2003   [facility]    APPENDECTOMY     CAROTID STENT     [facility]   CATARACT EXTRACTION, BILATERAL  2017   CHOLECYSTECTOMY  2002   COLONOSCOPY     COLONOSCOPY WITH PROPOFOL  N/A 07/17/2021   Procedure: COLONOSCOPY WITH PROPOFOL ;  Surgeon: Therisa Bi, MD;  Location: [facility] ENDOSCOPY;  Service: Gastroenterology;  Laterality: N/A;   CORONARY ARTERY BYPASS GRAFT N/A 09/16/2019   Procedure: CORONARY ARTERY BYPASS GRAFTING (CABG) using LIMA to LAD; Endoscopic right saphenous vein harvest to OM1, Diag1, and RCA.;  Surgeon: German Bartlett PEDLAR, MD;  Location: [facility] OR;  Service: Open Heart Surgery;  Laterality: N/A;   CYSTOSCOPY WITH BIOPSY N/A 10/08/2021   Procedure: CYSTOSCOPY WITH BLADDER BIOPSY;  Surgeon: Twylla Glendia BROCKS, MD;  Location: [facility] ORS;  Service: Urology;  Laterality: N/A;   ENDARTERECTOMY FEMORAL Left 03/13/2021   Procedure: ENDARTERECTOMY FEMORAL (GROIN REVISION);  Surgeon: Jama Cordella MATSU, MD;  Location: [facility] ORS;  Service: Vascular;  Laterality: Left;   EYE SURGERY  Cataracts removed ou   LEFT HEART CATH AND CORONARY ANGIOGRAPHY N/A 09/12/2019   Procedure: LEFT  HEART CATH AND CORONARY ANGIOGRAPHY;  Surgeon: Hester Wolm PARAS, MD;  Location: [facility] INVASIVE CV LAB;  Service: Cardiovascular;  Laterality: N/A;   LITHOTRIPSY     LOWER EXTREMITY ANGIOGRAPHY Left 01/15/2021   Procedure: LOWER EXTREMITY ANGIOGRAPHY;  Surgeon: Jama Cordella MATSU, MD;  Location: [facility] INVASIVE CV LAB;  Service: Cardiovascular;  Laterality: Left;   SPINE SURGERY  1954   TEE WITHOUT CARDIOVERSION N/A 09/16/2019   Procedure: TRANSESOPHAGEAL ECHOCARDIOGRAM (TEE);  Surgeon: German Bartlett PEDLAR, MD;  Location: [facility] OR;  Service: Open Heart Surgery;  Laterality: N/A;   TONSILLECTOMY     TRANSURETHRAL RESECTION OF BLADDER TUMOR   10/08/2021   Procedure: TRANSURETHRAL RESECTION OF BLADDER TUMOR (TURBT);  Surgeon: Twylla Glendia BROCKS, MD;  Location: [facility] ORS;  Service: Urology;;   TRANSURETHRAL RESECTION OF BLADDER TUMOR WITH MITOMYCIN -C N/A 07/24/2020   Procedure: TRANSURETHRAL RESECTION OF BLADDER TUMOR WITH Gemcitabine ;  Surgeon: Twylla Glendia BROCKS, MD;  Location: [facility] ORS;  Service: Urology;  Laterality: N/A;   VASECTOMY  1976   WISDOM TOOTH EXTRACTION     Patient Active Problem List   Diagnosis Date Noted   Type 2 diabetes mellitus with diabetic nephropathy, without long-term current use of insulin  (HCC) 09/13/2024   Lumbar radiculopathy 04/07/2024   Bladder cancer (HCC) 05/22/2021   Anemia, chronic disease 05/22/2021   Aortic atherosclerosis 05/17/2020   BPH (benign prostatic hyperplasia) 11/16/2019   S/P CABG x 4 11/04/2019   Atrial flutter (HCC) 11/04/2019   Allergic rhinitis 09/12/2019   Coronary artery disease due to lipid rich plaque    Stage 3b chronic kidney disease (HCC) 09/14/2018   BCC (basal cell carcinoma), face 04/12/2018   Actinic keratosis 08/14/2015   Advanced directives, counseling/discussion 07/25/2014   Hyperlipemia 03/02/2009   Essential hypertension, benign 09/22/2007   PAD (peripheral artery disease) 09/22/2007   Osteoarthritis, generalized 09/22/2007    PCP: Bennett Reuben POUR, MD  REFERRING PROVIDER: Carilyn [name] BRAVO, MD   REFERRING DIAG: [phone] (ICD-10-CM) - Lumbosacral spondylosis without myelopathy   Rationale for Evaluation and Treatment: Rehabilitation  THERAPY DIAG:  Difficulty in walking, not elsewhere classified  Chronic right-sided low back pain without sciatica  ONSET DATE:   SUBJECTIVE:                                                                                                                                                                                           SUBJECTIVE STATEMENT: FROM Today: patient reports had some good relief after last session.  Reports pain today in more in the back Right side.    From EVAL: Pt has pain in the right hip near the  Rt posterolateral belt loop. Pt denies any migration of symptoms, although they fluctuate in intensity, certainly worse when standing or walking. Pt reports pain as achy.   PERTINENT HISTORY:  89yoM referred to OPPT for acute on chronic exacerbation of Rt low back pain. Pt has pain in the right hip near the Rt posterolateral belt loop. Pt denies any migration of symptoms, although they fluctuate in intensity, certainly worse when standing or walking. Pt reports pain as achy. Pt reports >5 years history without any specific inciting event. Pt has chronic AMB limitations due to claudication of the legs. Pt AMB without device at baseline, reports no falls, no balance issues.   PAIN:  Are you having pain? 2/10 pain at present; 8-9/10 constant pain a few weeks ago.   PRECAUTIONS: None  WEIGHT BEARING RESTRICTIONS: No  FALLS:  Has patient fallen in last 6 months?   OCCUPATION: Retired teacher, early years/pre   PLOF: Independent   PATIENT GOALS: Have less pain   OBJECTIVE:  Note: Objective measures were completed at Evaluation unless otherwise noted.  DIAGNOSTIC FINDINGS:  None  PATIENT SURVEYS:  None  HIP ROM:   ROM Assessment Hip 09/26/24     Right  Left   Flexion Supine 120 125  External rotation (90/90) 42 45  Internal Rotation (90/90) 22 18  Supine hip ABDCT  25 38     PALPATION:  Pt has sharp pain upon palpations of gluteus medius in 3 anatomical regions near the iliac crest, and Rt anterior gluteus minimus.   GAIT: Distance walked: 174ft Assistive device utilized: none Level of assistance: Modified independent  Comments: Pain progression begins after 168ft   TREATMENT DATE 10/04/2024:                                                                                                                            -heat to Rt hip posterior -offered pretzels and 12oz water  for post  loose stool hypotension yesterday -Rt gluteus medius, gluteus minimus ischemic release x15 minutes -side stepping in // bars 2x66ft bilat 7.5lb cable resistance  *seated rest   -side stepping in // bars 2x51ft bilat 7.5lb cable resistance  *seated rest   -tandem stance on pine board 3x30sec bilat, alternating, in // bars with intermittent UE assist -Pain post session at 1/10 much better *seated pressure post session: 104/78mmHg 75bpm (reports feeling better, less dizzy)     PATIENT EDUCATION:  Education details: Education on how manual techniques can relieve pain and importance of strengthening can assist with chronic pain symptoms. Person educated: Patient Education method: Chief Technology Officer Education comprehension: Good   HOME EXERCISE PROGRAM: Access Code: [email or web address] URL: [email or web address] Date: 09/28/2024 Prepared by: Reyes London  Exercises - Supine Gluteal Sets  - 3 x weekly - 3 sets - 10 reps - Supine Bridge  - 3 x weekly - 3 sets - 10 reps - Supine Bridge with Mini Swiss Ball Between Knees  - 3 x weekly - 3  sets - 10 reps  ASSESSMENT:  CLINICAL IMPRESSION: Pt arrives lightheaded, unsteady, weak- has been having more loose stools recently due to medications. BP somewhat soft, particularly diastolic, pt offered fluids and a small cup of pretzels. Discussed modifying PO intake based on bowel habits. Repeat myofascial release to Rt hip, then continued to advance hip strength exercises. Pt remarks on proprioception awareness during exercises in // bars. HEP going well at home. Patient will benefit from skilled physical therapy intervention to reduce deficits and impairments identified in evaluation, in order to reduce pain, improve quality of life, and maximize activity tolerance for ADL, IADL, and leisure/fitness. Physical therapy will help pt achieve long and short term goals of care.   OBJECTIVE IMPAIRMENTS: Decreased knowledge of condition,  decreased use of DME, decreased mobility, difficulty walking, decreased strength, decreased ROM. ACTIVITY LIMITATIONS: Lifting, standing, walking, squatting, transfers, locomotion level PARTICIPATION LIMITATIONS: Cleaning, laundry, interpersonal relationships, driving, yardwork, community activity.  PERSONAL FACTORS: Age, behavior pattern, education, past/current experiences, transportation, profession  are also affecting patient's functional outcome.  REHAB POTENTIAL: Good CLINICAL DECISION MAKING: Medium  EVALUATION COMPLEXITY: Moderate    GOALS: Goals reviewed with patient? No  SHORT TERM GOALS: Target date: 10/19/24  Pt to show >15% improvement on LEFS.  Baseline: Goal status: INITIAL  2.  Pt to demonstrate ability to AMB >381ft without increase in pain symptoms.  Baseline:  Goal status: INITIAL  3.  Pt reports compliance with home and/or gym program for BLE strengthening and ROM.  Baseline: wants to joint Well-zone with silver sneakers  Goal status: INITIAL  LONG TERM GOALS: Target date: 11/07/24  Pt to report no increase in Rt hip pain >3/10 over most recent 7 days.  Baseline:  Goal status: INITIAL  2.  Pt to report confidence in DC level HEP for continued hip strength and tissue maintenance.  Baseline:  Goal status: INITIAL   PLAN:  PT FREQUENCY: 1-2x/week  PT DURATION: 6 weeks  PLANNED INTERVENTIONS: 97110-Therapeutic exercises, 97530- Therapeutic activity, [id number]- Neuromuscular re-education, 97535- Self Care, 02859- Manual therapy, [id number]- Gait training, [phone]- Orthotic Initial, [phone]- Orthotic/Prosthetic subsequent, H9716- Electrical stimulation (unattended), [phone]- Electrical stimulation (manual), [id number]- Traction (mechanical), [id number]- Ionotophoresis 4mg /ml Dexamethasone , Patient/Family education, Balance training, Stair training, Spinal manipulation, Spinal mobilization, Cognitive remediation, DME instructions, Cryotherapy, and Moist heat.  PLAN FOR NEXT SESSION:   Follow up on how exercises went and progress as appropriate Continue with manual technique for pain relief.   3:58 PM, 10/04/2024 Peggye JAYSON Linear, PT, DPT Physical Therapist - [facility]  Outpatient Physical Therapy- Main Campus [phone]

## 2024-10-06 ENCOUNTER — Ambulatory Visit

## 2024-10-06 DIAGNOSIS — R262 Difficulty in walking, not elsewhere classified: Secondary | ICD-10-CM

## 2024-10-06 DIAGNOSIS — G8929 Other chronic pain: Secondary | ICD-10-CM

## 2024-10-06 NOTE — Therapy (Signed)
 OUTPATIENT PHYSICAL THERAPY TREATMENT  Patient Name: Brandon Lewis MRN: [id number] DOB:Jan 23, 1934, 89 y.o., male Today's Date: 10/06/2024  END OF SESSION:  PT End of Session - 10/06/24 1617     Visit Number 4    Number of Visits 12    Date for Recertification  11/07/24    Authorization Type [employer]; through 11/07/24    Authorization Time Period 12 visits between 12/8-12/25/24    Progress Note Due on Visit 10    PT Start Time 1615    PT Stop Time 1655    PT Time Calculation (min) 40 min    Activity Tolerance Patient tolerated treatment well;No increased pain    Behavior During Therapy WFL for tasks assessed/performed           Past Medical History:  Diagnosis Date   Allergy side effects- NKDA   Anemia    Aortic atherosclerosis    Atrial flutter, paroxysmal (HCC)    a.) postoperatively following CABG   Bladder cancer (HCC) 04/2020   BPH (benign prostatic hyperplasia)    CAD (coronary artery disease) 09/12/2019   a.) LHC 09/12/2019: EF 55-60%; 85% m-dLM, 50% pLCx, 85% p-m LAD, 70% pLAD, 45% mLAD, 70% p-mRCA. b.) 4v CABG 09/16/2019.   CKD (chronic kidney disease), stage III (HCC)    Diabetes mellitus without complication (HCC)    Diverticulosis    GERD (gastroesophageal reflux disease)    History of kidney stones    HTN (hypertension)    Hyperlipidemia    Long term current use of antithrombotics/antiplatelets    a.) DAPT therapies (ASA + clopidogrel )   NSTEMI (non-ST elevated myocardial infarction) (HCC) 09/11/2019   a.) LHC 09/12/2019: 85% m-dLM, 50% pLCx, 85% p-m LAD, 70% pLAD, 45% mLAD, 70% p-mRCA; transferred to [facility] for [facility] consult. Underwent 4v CABG on 09/16/2019.   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   a.) 4v CABG: LIMA-LAD,  SVG-RCA, SVG-OM1, SVG-D1   Small bowel obstruction (HCC)    after surgeries   Thoracic ascending aortic aneurysm    a.) measured 4.1 cm by CT on 09/14/2019   Past Surgical History:  Procedure Laterality Date   aorto-bifem  2003   [facility]    APPENDECTOMY     CAROTID STENT     [facility]   CATARACT EXTRACTION, BILATERAL  2017   CHOLECYSTECTOMY  2002   COLONOSCOPY     COLONOSCOPY WITH PROPOFOL  N/A 07/17/2021   Procedure: COLONOSCOPY WITH PROPOFOL ;  Surgeon: Therisa Bi, MD;  Location: [facility] ENDOSCOPY;  Service: Gastroenterology;  Laterality: N/A;   CORONARY ARTERY BYPASS GRAFT N/A 09/16/2019   Procedure: CORONARY ARTERY BYPASS GRAFTING (CABG) using LIMA to LAD; Endoscopic right saphenous vein harvest to OM1, Diag1, and RCA.;  Surgeon: German Bartlett PEDLAR, MD;  Location: [facility] OR;  Service: Open Heart Surgery;  Laterality: N/A;   CYSTOSCOPY WITH BIOPSY N/A 10/08/2021   Procedure: CYSTOSCOPY WITH BLADDER BIOPSY;  Surgeon: Twylla Glendia BROCKS, MD;  Location: [facility] ORS;  Service: Urology;  Laterality: N/A;   ENDARTERECTOMY FEMORAL Left 03/13/2021   Procedure: ENDARTERECTOMY FEMORAL (GROIN REVISION);  Surgeon: Jama Cordella MATSU, MD;  Location: [facility] ORS;  Service: Vascular;  Laterality: Left;   EYE SURGERY  Cataracts removed ou   LEFT HEART CATH AND CORONARY ANGIOGRAPHY N/A  09/12/2019   Procedure: LEFT HEART CATH AND CORONARY ANGIOGRAPHY;  Surgeon: Hester Wolm PARAS, MD;  Location: [facility] INVASIVE CV LAB;  Service: Cardiovascular;  Laterality: N/A;   LITHOTRIPSY     LOWER EXTREMITY ANGIOGRAPHY Left 01/15/2021   Procedure: LOWER EXTREMITY ANGIOGRAPHY;  Surgeon: Jama Cordella MATSU, MD;  Location: [facility] INVASIVE CV LAB;  Service: Cardiovascular;  Laterality: Left;   SPINE SURGERY  1954   TEE WITHOUT CARDIOVERSION N/A 09/16/2019   Procedure: TRANSESOPHAGEAL ECHOCARDIOGRAM (TEE);  Surgeon: German Bartlett PEDLAR, MD;  Location: [facility] OR;  Service: Open Heart Surgery;  Laterality: N/A;   TONSILLECTOMY     TRANSURETHRAL  RESECTION OF BLADDER TUMOR  10/08/2021   Procedure: TRANSURETHRAL RESECTION OF BLADDER TUMOR (TURBT);  Surgeon: Twylla Glendia BROCKS, MD;  Location: [facility] ORS;  Service: Urology;;   TRANSURETHRAL RESECTION OF BLADDER TUMOR WITH MITOMYCIN -C N/A 07/24/2020   Procedure: TRANSURETHRAL RESECTION OF BLADDER TUMOR WITH Gemcitabine ;  Surgeon: Twylla Glendia BROCKS, MD;  Location: [facility] ORS;  Service: Urology;  Laterality: N/A;   VASECTOMY  1976   WISDOM TOOTH EXTRACTION     Patient Active Problem List   Diagnosis Date Noted   Type 2 diabetes mellitus with diabetic nephropathy, without long-term current use of insulin  (HCC) 09/13/2024   Lumbar radiculopathy 04/07/2024   Bladder cancer (HCC) 05/22/2021   Anemia, chronic disease 05/22/2021   Aortic atherosclerosis 05/17/2020   BPH (benign prostatic hyperplasia) 11/16/2019   S/P CABG x 4 11/04/2019   Atrial flutter (HCC) 11/04/2019   Allergic rhinitis 09/12/2019   Coronary artery disease due to lipid rich plaque    Stage 3b chronic kidney disease (HCC) 09/14/2018   BCC (basal cell carcinoma), face 04/12/2018   Actinic keratosis 08/14/2015   Advanced directives, counseling/discussion 07/25/2014   Hyperlipemia 03/02/2009   Essential hypertension, benign 09/22/2007   PAD (peripheral artery disease) 09/22/2007   Osteoarthritis, generalized 09/22/2007    PCP: Bennett Reuben POUR, MD  REFERRING PROVIDER: Carilyn [name] BRAVO, MD   REFERRING DIAG: [phone] (ICD-10-CM) - Lumbosacral spondylosis without myelopathy   Rationale for Evaluation and Treatment: Rehabilitation  THERAPY DIAG:  Difficulty in walking, not elsewhere classified  Chronic right-sided low back pain without sciatica  ONSET DATE:   SUBJECTIVE:                                                                                                                                                                                           SUBJECTIVE STATEMENT: FROM Today: patient reports had some good  relief after last session. Reports pain today in more in the back Right side.    From EVAL: Pt has pain in  the right hip near the Rt posterolateral belt loop. Pt denies any migration of symptoms, although they fluctuate in intensity, certainly worse when standing or walking. Pt reports pain as achy.   PERTINENT HISTORY:  89yoM referred to [facility] for acute on chronic exacerbation of Rt low back pain. Pt has pain in the right hip near the Rt posterolateral belt loop. Pt denies any migration of symptoms, although they fluctuate in intensity, certainly worse when standing or walking. Pt reports pain as achy. Pt reports >5 years history without any specific inciting event. Pt has chronic AMB limitations due to claudication of the legs. Pt AMB without device at baseline, reports no falls, no balance issues.   PAIN:  Are you having pain? 2/10 pain at present; 8-9/10 constant pain a few weeks ago.   PRECAUTIONS: None  WEIGHT BEARING RESTRICTIONS: No  FALLS:  Has patient fallen in last 6 months?   OCCUPATION: Retired teacher, early years/pre   PLOF: Independent   PATIENT GOALS: Have less pain   OBJECTIVE:  Note: Objective measures were completed at Evaluation unless otherwise noted.  DIAGNOSTIC FINDINGS:  None  PATIENT SURVEYS:  None  HIP ROM:   ROM Assessment Hip 09/26/24     Right  Left   Flexion Supine 120 125  External rotation (90/90) 42 45  Internal Rotation (90/90) 22 18  Supine hip ABDCT  25 38     PALPATION:  Pt has sharp pain upon palpations of gluteus medius in 3 anatomical regions near the iliac crest, and Rt anterior gluteus minimus.   GAIT: Distance walked: 135ft Assistive device utilized: none Level of assistance: Modified independent  Comments: Pain progression begins after 183ft   TREATMENT DATE 10/06/2024:                                                                                                                            -heat to Rt hip posterior, reclined, BP  assessment  122/62 mmHG  77bpm , much better today, pt denies any dizzy issues today  -STS from elevated surface 3x5, hands free -Rt gluteus medius, gluteus minimus ischemic release 3 minutes just proximal the tendon (much improved at posterior portion, not tender like on Tuesday, wow!)  -side stepping in // bars 3x83ft bilat 7.5lb cable resistance  *seated rest   -side stepping in // bars 3x22ft bilat 7.5lb cable resistance   *seated rest   -AMB overground c 2.5lb AW bilat, rollator: 483ft, minGuard Assist: 34m45s (legs beginning to tightenup after 349ft, similar to what he has associated with vascular claudication; currently being evaluated with vascular surgery)     PATIENT EDUCATION:  Education details: Talked about the relationship between bowel and bladder urgency pt is concerned about.  Person educated: Patient Education method: Chief Technology Officer Education comprehension: Good   HOME EXERCISE PROGRAM: Access Code: [email or web address] URL: [email or web address] Date: 09/28/2024 Prepared by: Reyes London  Exercises - Supine Gluteal Sets  - 3 x weekly - 3 sets -  10 reps - Supine Bridge  - 3 x weekly - 3 sets - 10 reps - Supine Bridge with Mini Swiss Ball Between Knees  - 3 x weekly - 3 sets - 10 reps  ASSESSMENT:  CLINICAL IMPRESSION: Pt feeling much better today regarding hydration status despite continued bowel issues, he is keeping up with fluids and pressures appear WNL. No need to repeat manual release at Rt gluteus medius remains lose and much improved since last session. Pt tolerates stepping in bars with resistance much better than prior visit, kept harness off ABD during exercise. Pain remains stable and at goal, although not pain free as of yet. Patient will benefit from skilled physical therapy intervention to reduce deficits and impairments identified in evaluation, in order to reduce pain, improve quality of life, and maximize activity tolerance for  ADL, IADL, and leisure/fitness. Physical therapy will help pt achieve long and short term goals of care.   OBJECTIVE IMPAIRMENTS: Decreased knowledge of condition, decreased use of DME, decreased mobility, difficulty walking, decreased strength, decreased ROM. ACTIVITY LIMITATIONS: Lifting, standing, walking, squatting, transfers, locomotion level PARTICIPATION LIMITATIONS: Cleaning, laundry, interpersonal relationships, driving, yardwork, community activity.  PERSONAL FACTORS: Age, behavior pattern, education, past/current experiences, transportation, profession  are also affecting patient's functional outcome.  REHAB POTENTIAL: Good CLINICAL DECISION MAKING: Medium  EVALUATION COMPLEXITY: Moderate    GOALS: Goals reviewed with patient? No  SHORT TERM GOALS: Target date: 10/19/24  Pt to show >15% improvement on LEFS.  Baseline: Goal status: INITIAL  2.  Pt to demonstrate ability to AMB >354ft without increase in pain symptoms.  Baseline:  Goal status: INITIAL  3.  Pt reports compliance with home and/or gym program for BLE strengthening and ROM.  Baseline: wants to joint Well-zone with silver sneakers  Goal status: INITIAL  LONG TERM GOALS: Target date: 11/07/24  Pt to report no increase in Rt hip pain >3/10 over most recent 7 days.  Baseline:  Goal status: INITIAL  2.  Pt to report confidence in DC level HEP for continued hip strength and tissue maintenance.  Baseline:  Goal status: INITIAL   PLAN:  PT FREQUENCY: 1-2x/week  PT DURATION: 6 weeks  PLANNED INTERVENTIONS: 97110-Therapeutic exercises, 97530- Therapeutic activity, [id number]- Neuromuscular re-education, 97535- Self Care, 02859- Manual therapy, [phone]- Gait training, [phone]- Orthotic Initial, [phone]- Orthotic/Prosthetic subsequent, H9716- Electrical stimulation (unattended), [phone]- Electrical stimulation (manual), [id number]- Traction (mechanical), [id number]- Ionotophoresis 4mg /ml Dexamethasone , Patient/Family education,  Balance training, Stair training, Spinal manipulation, Spinal mobilization, Cognitive remediation, DME instructions, Cryotherapy, and Moist heat.  PLAN FOR NEXT SESSION:  Continue with manual technique for pain relief Advance hip loading  4:32 PM, 10/06/2024 Peggye JAYSON Linear, PT, DPT Physical Therapist - [facility]  Outpatient Physical Therapy- Main Campus [phone]

## 2024-10-07 NOTE — Telephone Encounter (Signed)
 Patient should be scheduled to come in and discuss MRIs with GS

## 2024-10-10 ENCOUNTER — Ambulatory Visit

## 2024-10-17 ENCOUNTER — Ambulatory Visit (INDEPENDENT_AMBULATORY_CARE_PROVIDER_SITE_OTHER): Admitting: Vascular Surgery

## 2024-10-17 ENCOUNTER — Encounter (INDEPENDENT_AMBULATORY_CARE_PROVIDER_SITE_OTHER): Payer: Self-pay | Admitting: Vascular Surgery

## 2024-10-17 VITALS — BP 122/71 | HR 72 | Resp 18 | Ht 70.0 in | Wt 182.6 lb

## 2024-10-17 DIAGNOSIS — I10 Essential (primary) hypertension: Secondary | ICD-10-CM

## 2024-10-17 DIAGNOSIS — I2510 Atherosclerotic heart disease of native coronary artery without angina pectoris: Secondary | ICD-10-CM

## 2024-10-17 DIAGNOSIS — I2583 Coronary atherosclerosis due to lipid rich plaque: Secondary | ICD-10-CM

## 2024-10-17 DIAGNOSIS — I739 Peripheral vascular disease, unspecified: Secondary | ICD-10-CM | POA: Diagnosis not present

## 2024-10-17 DIAGNOSIS — E1121 Type 2 diabetes mellitus with diabetic nephropathy: Secondary | ICD-10-CM

## 2024-10-17 NOTE — Progress Notes (Signed)
 "                                                                      MRN : [id number]  Brandon Lewis is a 89 y.o. (11/15/1933) male who presents with chief complaint of check circulation.  History of Present Illness:   The patient returns to the office for followup and review of the noninvasive studies.  He has a history of remote aortobifemoral bypass grafting.   He is s/p redo left common femoral, superficial femoral and profunda femoris endarterectomy with Cormatrix patch angioplasty on 03/13/2021.   There have been no interval changes in lower extremity symptoms.  In fact he feels that he has been doing well.  No interval shortening of the patient's claudication distance (he states that remains about the same) no development of rest pain symptoms. No new ulcers or wounds have occurred since the last visit.   There have been no significant changes to the patient's overall health care.   The patient denies amaurosis fugax or recent TIA symptoms. There are no documented recent neurological changes noted. There is no history of DVT, PE or superficial thrombophlebitis. The patient denies recent episodes of angina or shortness of breath.    ABI Rt=0.68 and Lt=0.74  (previous ABI's Rt=0.91 and Lt=0.91).  There appears to be a decrease compared to the previous study   Duplex ultrasound of the abdominal aorta and bilateral iliac arteries dated 03/02/23 shows a widely patent aortobifemoral bypass graft no evidence of hemodynamically significant restenosis.  MRA dated September 16, 2024 is reviewed by me with the patient and shows a widely patent aortobifemoral bypass.  There is no evidence of narrowing stricture or stenosis at the level of the femoral anastomoses bilaterally  Active Medications[1]  Past Medical History:  Diagnosis Date   Allergy side effects- NKDA   Anemia    Aortic atherosclerosis    Atrial flutter, paroxysmal (HCC)    a.) postoperatively following CABG   Bladder  cancer (HCC) 04/2020   BPH (benign prostatic hyperplasia)    CAD (coronary artery disease) 09/12/2019   a.) LHC 09/12/2019: EF 55-60%; 85% m-dLM, 50% pLCx, 85% p-m LAD, 70% pLAD, 45% mLAD, 70% p-mRCA. b.) 4v CABG 09/16/2019.   CKD (chronic kidney disease), stage III (HCC)    Diabetes mellitus without complication (HCC)    Diverticulosis    GERD (gastroesophageal reflux disease)    History of kidney stones    HTN (hypertension)    Hyperlipidemia    Long term current use of antithrombotics/antiplatelets    a.) DAPT therapies (ASA + clopidogrel )   NSTEMI (non-ST elevated myocardial infarction) (HCC) 09/11/2019   a.) LHC 09/12/2019: 85% m-dLM, 50% pLCx, 85% p-m LAD, 70% pLAD, 45% mLAD, 70% p-mRCA; transferred to [facility] for CVTS consult. Underwent 4v CABG on 09/16/2019.   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   a.) 4v CABG: LIMA-LAD, SVG-RCA, SVG-OM1, SVG-D1   Small bowel obstruction (HCC)    after surgeries   Thoracic ascending aortic aneurysm  a.) measured 4.1 cm by CT on 09/14/2019    Past Surgical History:  Procedure Laterality Date   aorto-bifem  2003   [facility]    APPENDECTOMY     CAROTID STENT     [facility]   CATARACT EXTRACTION, BILATERAL  2017   CHOLECYSTECTOMY  2002   COLONOSCOPY     COLONOSCOPY WITH PROPOFOL  N/A 07/17/2021   Procedure: COLONOSCOPY WITH PROPOFOL ;  Surgeon: Therisa Bi, MD;  Location: [facility] ENDOSCOPY;  Service: Gastroenterology;  Laterality: N/A;   CORONARY ARTERY BYPASS GRAFT N/A 09/16/2019   Procedure: CORONARY ARTERY BYPASS GRAFTING (CABG) using LIMA to LAD; Endoscopic right saphenous vein harvest to OM1, Diag1, and RCA.;  Surgeon: German Bartlett PEDLAR, MD;  Location: [facility] OR;  Service: Open Heart Surgery;  Laterality: N/A;   CYSTOSCOPY WITH  BIOPSY N/A 10/08/2021   Procedure: CYSTOSCOPY WITH BLADDER BIOPSY;  Surgeon: Twylla Glendia BROCKS, MD;  Location: [facility] ORS;  Service: Urology;  Laterality: N/A;   ENDARTERECTOMY FEMORAL Left 03/13/2021   Procedure: ENDARTERECTOMY FEMORAL (GROIN REVISION);  Surgeon: Jama Cordella MATSU, MD;  Location: [facility] ORS;  Service: Vascular;  Laterality: Left;   EYE SURGERY  Cataracts removed ou   LEFT HEART CATH AND CORONARY ANGIOGRAPHY N/A 09/12/2019   Procedure: LEFT HEART CATH AND CORONARY ANGIOGRAPHY;  Surgeon: Hester Wolm PARAS, MD;  Location: [facility] INVASIVE CV LAB;  Service: Cardiovascular;  Laterality: N/A;   LITHOTRIPSY     LOWER EXTREMITY ANGIOGRAPHY Left 01/15/2021   Procedure: LOWER EXTREMITY ANGIOGRAPHY;  Surgeon: Jama Cordella MATSU, MD;  Location: [facility] INVASIVE CV LAB;  Service: Cardiovascular;  Laterality: Left;   SPINE SURGERY  1954   TEE WITHOUT CARDIOVERSION N/A 09/16/2019   Procedure: TRANSESOPHAGEAL ECHOCARDIOGRAM (TEE);  Surgeon: German Bartlett PEDLAR, MD;  Location: [facility] OR;  Service: Open Heart Surgery;  Laterality: N/A;   TONSILLECTOMY     TRANSURETHRAL RESECTION OF BLADDER TUMOR  10/08/2021   Procedure: TRANSURETHRAL RESECTION OF BLADDER TUMOR (TURBT);  Surgeon: Twylla Glendia BROCKS, MD;  Location: [facility] ORS;  Service: Urology;;   TRANSURETHRAL RESECTION OF BLADDER TUMOR WITH MITOMYCIN -C N/A 07/24/2020   Procedure: TRANSURETHRAL RESECTION OF BLADDER TUMOR WITH Gemcitabine ;  Surgeon: Twylla Glendia BROCKS, MD;  Location: [facility] ORS;  Service: Urology;  Laterality: N/A;   VASECTOMY  1976   WISDOM TOOTH EXTRACTION      Social History Social History[2]  Family History Family History  Problem Relation Age of Onset   Cancer Mother    Diabetes Father        DM - and brother    Stroke Father    Coronary artery disease Paternal Grandfather    Colon cancer Neg Hx    Prostate cancer Neg Hx     Allergies[3]   REVIEW OF SYSTEMS (Negative unless checked)  Constitutional: [] Weight loss  [] Fever   [] Chills Cardiac: [] Chest pain   [] Chest pressure   [] Palpitations   [] Shortness of breath when laying flat   [] Shortness of breath with exertion. Vascular:  [x] Pain in legs with walking   [] Pain in legs at rest  [] History of DVT   [] Phlebitis   [] Swelling in legs   [] Varicose veins   [] Non-healing ulcers Pulmonary:   [] Uses home oxygen   [] Productive cough   [] Hemoptysis   [] Wheeze  [] COPD   [] Asthma Neurologic:  [] Dizziness   [] Seizures   [] History of stroke   [] History of TIA  [] Aphasia   [] Vissual changes   [] Weakness or numbness in arm   [] Weakness or numbness in leg  Musculoskeletal:   [] Joint swelling   [] Joint pain   [] Low back pain Hematologic:  [] Easy bruising  [] Easy bleeding   [] Hypercoagulable state   [] Anemic Gastrointestinal:  [] Diarrhea   [] Vomiting  [] Gastroesophageal reflux/heartburn   [] Difficulty swallowing. Genitourinary:  [] Chronic kidney disease   [] Difficult urination  [] Frequent urination   [] Blood in urine Skin:  [] Rashes   [] Ulcers  Psychological:  [] History of anxiety   []  History of major depression.  Physical Examination  Vitals:   10/17/24 1424  BP: 122/71  Pulse: 72  Resp: 18  Weight: 182 lb 9.6 oz (82.8 kg)  Height: 5' 10 (1.778 m)   Body mass index is 26.2 kg/m. Gen: WD/WN, NAD Head: [place]/AT, No temporalis wasting.  Ear/Nose/Throat: Hearing grossly intact, nares w/o erythema or drainage Eyes: PER, EOMI, sclera nonicteric.  Neck: Supple, no masses.  No bruit or JVD.  Pulmonary:  Good air movement, no audible wheezing, no use of accessory muscles.  Cardiac: RRR, normal S1, S2, no Murmurs. Vascular:  mild trophic changes, no open wounds Vessel Right Left  Radial Palpable Palpable  PT Not Palpable Not Palpable  DP Not Palpable Not Palpable  Gastrointestinal: soft, non-distended. No guarding/no peritoneal signs.  Musculoskeletal: M/S 5/5 throughout.  No visible deformity.  Neurologic: CN 2-12 intact. Pain and light touch intact in extremities.   Symmetrical.  Speech is fluent. Motor exam as listed above. Psychiatric: Judgment intact, Mood & affect appropriate for pt's clinical situation. Dermatologic: No rashes or ulcers noted.  No changes consistent with cellulitis.   CBC Lab Results  Component Value Date   WBC 11.0 (H) 08/16/2024   HGB 9.6 (L) 08/16/2024   HCT 30.7 (L) 08/16/2024   MCV 126.9 (H) 08/16/2024   PLT 181 08/16/2024    BMET    Component Value Date/Time   NA 135 07/28/2024 1425   K 4.4 07/28/2024 1425   CL 103 07/28/2024 1425   CO2 23 07/28/2024 1425   GLUCOSE 146 (H) 07/28/2024 1425   BUN 52 (H) 07/28/2024 1425   CREATININE 2.23 (H) 07/28/2024 1425   CALCIUM  9.0 07/28/2024 1425   GFRNONAA 28 (L) 07/28/2024 1425   GFRAA 45 (L) 10/01/2019 0327   CrCl cannot be calculated (Patient's most recent lab result is older than the maximum 21 days allowed.).  COAG Lab Results  Component Value Date   INR 1.1 03/05/2021   INR 1.3 (H) 09/16/2019   INR 1.1 09/14/2019    Radiology No results found.   Assessment/Plan 1. PAD (peripheral artery disease) (Primary) Recommend:  The patient is status post MR angiogram.  The patient reports that the claudication symptoms and leg pain are minimal.   The patient denies lifestyle limiting changes at this point in time.  No further invasive studies, angiography or surgery at this time. The patient should continue walking and begin a more formal exercise program.  The patient should continue antiplatelet therapy and aggressive treatment of the lipid abnormalities  Continued surveillance is indicated as atherosclerosis is likely to progress with time.    Patient should undergo noninvasive studies as ordered. The patient will follow up with me to review the studies.  - VAS US  ABI WITH/WO TBI; Future  2. Essential hypertension, benign Continue antihypertensive medications as already ordered, these medications have been reviewed and there are no changes at this  time.  3. Coronary artery disease due to lipid rich plaque Continue cardiac and antihypertensive medications as already ordered and reviewed, no changes at this time.  Continue statin as ordered and reviewed, no changes at this time  Nitrates PRN for chest pain  4. Type 2 diabetes mellitus with diabetic nephropathy, without long-term current use of insulin  (HCC) Continue hypoglycemic medications as already ordered, these medications have been reviewed and there are no changes at this time.  Hgb A1C to be monitored as already arranged by primary service    Cordella Shawl, MD  10/17/2024 2:38 PM      [1]  Current Meds  Medication Sig   aspirin  EC 81 MG tablet Take 81 mg by mouth in the morning. Swallow whole.   calcium  carbonate (TUMS - DOSED IN MG ELEMENTAL CALCIUM ) 500 MG chewable tablet Chew 1,000 mg by mouth daily as needed for indigestion or heartburn.   Cholecalciferol  (VITAMIN D ) 50 MCG (2000 UT) CAPS Take 2,000 Units by mouth in the morning.   clopidogrel  (PLAVIX ) 75 MG tablet Take 75 mg by mouth daily.   FARXIGA 5 MG TABS tablet Take 5 mg by mouth daily.   fluticasone  (FLONASE ) 50 MCG/ACT nasal spray Place 2 sprays into both nostrils daily.   furosemide  (LASIX ) 40 MG tablet Take 40 mg by mouth.   hydrocortisone  (ANUSOL -HC) 2.5 % rectal cream Place 1 application rectally daily as needed for hemorrhoids or anal itching.   losartan  (COZAAR ) 25 MG tablet Take 25 mg by mouth daily.   MAGNESIUM  GLYCINATE PO Take 500 mg by mouth every evening.   metFORMIN  (GLUCOPHAGE -XR) 500 MG 24 hr tablet * VIAL* TAKE 2 TABLETS (1000MG ) BY MOUTH ONCE DAILY WITH BREAKFAST *TAKE WITH FOOD* *DO NOT CRUSH OR CHEW* *BOTTLE*   metoprolol  succinate (TOPROL -XL) 50 MG 24 hr tablet **VIAL ONLY* TAKE (1) TABLET BY MOUTH ONCE A DAY.DO NOT CRUSH. (HIGHBLOOD PRESSURE)  *DO NOT CRUSH OR CHEW*   metroNIDAZOLE  (METROGEL ) 0.75 % gel APPLY 1 APPLICATION TOPICALLY TWICE DAILY   Multiple Vitamin (MULTIVITAMIN  WITH MINERALS) TABS tablet Take 1 tablet by mouth daily. Centrum Silver   NEXLIZET 180-10 MG TABS Take 1 tablet by mouth daily.   pantoprazole  (PROTONIX ) 40 MG tablet Take 1 tablet (40 mg total) by mouth daily as needed (indigestion/heartburn).   sodium chloride  (OCEAN) 0.65 % nasal spray Place 1 spray into the nose daily as needed (sinus congestion).   tamsulosin  (FLOMAX ) 0.4 MG CAPS capsule TAKE 1 CAPSULE BY MOUTH EVERY DAY   TURMERIC CURCUMIN PO Take 2 tablets by mouth daily.  [2]  Social History Tobacco Use   Smoking status: Former    Current packs/day: 0.00    Average packs/day: 1 pack/day for 20.0 years (20.0 ttl pk-yrs)    Types: Cigarettes    Start date: 10/20/1972    Quit date: 10/20/1992    Years since quitting: 32.0    Passive exposure: Never   Smokeless tobacco: Never   Tobacco comments:    quit in 1994   Vaping Use   Vaping status: Never Used  Substance Use Topics   Alcohol use: Yes    Alcohol/week: 1.0 standard drink of alcohol    Types: 1 Standard drinks or equivalent per week    Comment: Occasionally at social functions   Drug use: Never  [3]  Allergies Allergen Reactions   Cilostazol Other (See Comments)    Bowel upset   Simvastatin Other (See Comments)    myalgia   "

## 2024-10-19 ENCOUNTER — Ambulatory Visit

## 2024-10-19 DIAGNOSIS — R262 Difficulty in walking, not elsewhere classified: Secondary | ICD-10-CM

## 2024-10-19 DIAGNOSIS — M545 Low back pain, unspecified: Secondary | ICD-10-CM

## 2024-10-19 NOTE — Therapy (Signed)
 " OUTPATIENT PHYSICAL THERAPY TREATMENT  Patient Name: Brandon Lewis MRN: [id number] DOB:[birth year], 89 y.o., male Today's Date: 10/19/2024  END OF SESSION:  PT End of Session - 10/19/24 1629     Visit Number 5    Number of Visits 12    Date for Recertification  11/07/24    Authorization Type [employer]; through 11/07/24    Authorization Time Period 12 visits between 12/8-12/25/24    Progress Note Due on Visit 10    PT Start Time 1615    PT Stop Time 1655    PT Time Calculation (min) 40 min    Activity Tolerance Patient tolerated treatment well;No increased pain    Behavior During Therapy WFL for tasks assessed/performed           Past Medical History:  Diagnosis Date   Allergy side effects- NKDA   Anemia    Aortic atherosclerosis    Atrial flutter, paroxysmal (HCC)    a.) postoperatively following CABG   Bladder cancer (HCC) 04/2020   BPH (benign prostatic hyperplasia)    CAD (coronary artery disease) 09/12/2019   a.) LHC 09/12/2019: EF 55-60%; 85% m-dLM, 50% pLCx, 85% p-m LAD, 70% pLAD, 45% mLAD, 70% p-mRCA. b.) 4v CABG 09/16/2019.   CKD (chronic kidney disease), stage III (HCC)    Diabetes mellitus without complication (HCC)    Diverticulosis    GERD (gastroesophageal reflux disease)    History of kidney stones    HTN (hypertension)    Hyperlipidemia    Long term current use of antithrombotics/antiplatelets    a.) DAPT therapies (ASA + clopidogrel )   NSTEMI (non-ST elevated myocardial infarction) (HCC) 09/11/2019   a.) LHC 09/12/2019: 85% m-dLM, 50% pLCx, 85% p-m LAD, 70% pLAD, 45% mLAD, 70% p-mRCA; transferred to [facility] for CVTS consult. Underwent 4v CABG on 09/16/2019.   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   a.) 4v CABG: LIMA-LAD,  SVG-RCA, SVG-OM1, SVG-D1   Small bowel obstruction (HCC)    after surgeries   Thoracic ascending aortic aneurysm    a.) measured 4.1 cm by CT on 09/14/2019   Past Surgical History:  Procedure Laterality Date   aorto-bifem  2003   [facility]    APPENDECTOMY     CAROTID STENT     [facility]   CATARACT EXTRACTION, BILATERAL  2017   CHOLECYSTECTOMY  2002   COLONOSCOPY     COLONOSCOPY WITH PROPOFOL  N/A 07/17/2021   Procedure: COLONOSCOPY WITH PROPOFOL ;  Surgeon: Therisa Bi, MD;  Location: [facility] ENDOSCOPY;  Service: Gastroenterology;  Laterality: N/A;   CORONARY ARTERY BYPASS GRAFT N/A 09/16/2019   Procedure: CORONARY ARTERY BYPASS GRAFTING (CABG) using LIMA to LAD; Endoscopic right saphenous vein harvest to OM1, Diag1, and RCA.;  Surgeon: German Bartlett PEDLAR, MD;  Location: [facility] OR;  Service: Open Heart Surgery;  Laterality: N/A;   CYSTOSCOPY WITH BIOPSY N/A 10/08/2021   Procedure: CYSTOSCOPY WITH BLADDER BIOPSY;  Surgeon: Twylla Glendia BROCKS, MD;  Location: [facility] ORS;  Service: Urology;  Laterality: N/A;   ENDARTERECTOMY FEMORAL Left 03/13/2021   Procedure: ENDARTERECTOMY FEMORAL (GROIN REVISION);  Surgeon: Jama Cordella MATSU, MD;  Location: [facility] ORS;  Service: Vascular;  Laterality: Left;   EYE SURGERY  Cataracts removed ou   LEFT HEART CATH AND CORONARY ANGIOGRAPHY  N/A 09/12/2019   Procedure: LEFT HEART CATH AND CORONARY ANGIOGRAPHY;  Surgeon: Hester Wolm PARAS, MD;  Location: [facility] INVASIVE CV LAB;  Service: Cardiovascular;  Laterality: N/A;   LITHOTRIPSY     LOWER EXTREMITY ANGIOGRAPHY Left 01/15/2021   Procedure: LOWER EXTREMITY ANGIOGRAPHY;  Surgeon: Jama Cordella MATSU, MD;  Location: [facility] INVASIVE CV LAB;  Service: Cardiovascular;  Laterality: Left;   SPINE SURGERY  1954   TEE WITHOUT CARDIOVERSION N/A 09/16/2019   Procedure: TRANSESOPHAGEAL ECHOCARDIOGRAM (TEE);  Surgeon: German Bartlett PEDLAR, MD;  Location: [facility] OR;  Service: Open Heart Surgery;  Laterality: N/A;   TONSILLECTOMY     TRANSURETHRAL  RESECTION OF BLADDER TUMOR  10/08/2021   Procedure: TRANSURETHRAL RESECTION OF BLADDER TUMOR (TURBT);  Surgeon: Twylla Glendia BROCKS, MD;  Location: [facility] ORS;  Service: Urology;;   TRANSURETHRAL RESECTION OF BLADDER TUMOR WITH MITOMYCIN -C N/A 07/24/2020   Procedure: TRANSURETHRAL RESECTION OF BLADDER TUMOR WITH Gemcitabine ;  Surgeon: Twylla Glendia BROCKS, MD;  Location: [facility] ORS;  Service: Urology;  Laterality: N/A;   VASECTOMY  1976   WISDOM TOOTH EXTRACTION     Patient Active Problem List   Diagnosis Date Noted   Type 2 diabetes mellitus with diabetic nephropathy, without long-term current use of insulin  (HCC) 09/13/2024   Lumbar radiculopathy 04/07/2024   Bladder cancer (HCC) 05/22/2021   Anemia, chronic disease 05/22/2021   Aortic atherosclerosis 05/17/2020   BPH (benign prostatic hyperplasia) 11/16/2019   S/P CABG x 4 11/04/2019   Atrial flutter (HCC) 11/04/2019   Allergic rhinitis 09/12/2019   Coronary artery disease due to lipid rich plaque    Stage 3b chronic kidney disease (HCC) 09/14/2018   BCC (basal cell carcinoma), face 04/12/2018   Actinic keratosis 08/14/2015   Advanced directives, counseling/discussion 07/25/2014   Hyperlipemia 03/02/2009   Essential hypertension, benign 09/22/2007   PAD (peripheral artery disease) 09/22/2007   Osteoarthritis, generalized 09/22/2007    PCP: Bennett Reuben POUR, MD  REFERRING PROVIDER: Carilyn [name] BRAVO, MD   REFERRING DIAG: [phone] (ICD-10-CM) - Lumbosacral spondylosis without myelopathy   Rationale for Evaluation and Treatment: Rehabilitation  THERAPY DIAG:  Difficulty in walking, not elsewhere classified  Chronic right-sided low back pain without sciatica  ONSET DATE:   SUBJECTIVE:                                                                                                                                                                                           SUBJECTIVE STATEMENT: Pt doing generally well. He reports his pain  has really been trending down overall.   PERTINENT HISTORY:  89yoM referred to OPPT for acute on chronic exacerbation of  Rt low back pain. Pt has pain in the right hip near the Rt posterolateral belt loop. Pt denies any migration of symptoms, although they fluctuate in intensity, certainly worse when standing or walking. Pt reports pain as achy. Pt reports >5 years history without any specific inciting event. Pt has chronic AMB limitations due to claudication of the legs. Pt AMB without device at baseline, reports no falls, no balance issues. Recent FU with vascular surgery in December shows no new blockage issues.   PAIN:  Are you having pain? ~1/10 today   PRECAUTIONS: None  WEIGHT BEARING RESTRICTIONS: No  FALLS:  Has patient fallen in last 6 months?   OCCUPATION: Retired teacher, early years/pre   PLOF: Independent   PATIENT GOALS: Have less pain   OBJECTIVE:  Note: Objective measures were completed at Evaluation unless otherwise noted.  DIAGNOSTIC FINDINGS:  None  PATIENT SURVEYS:  None  HIP ROM:   ROM Assessment Hip 09/26/24     Right  Left   Flexion Supine 120 125  External rotation (90/90) 42 45  Internal Rotation (90/90) 22 18  Supine hip ABDCT  25 38     PALPATION:  Pt has sharp pain upon palpations of gluteus medius in 3 anatomical regions near the iliac crest, and Rt anterior gluteus minimus.   GAIT: Distance walked: 153ft Assistive device utilized: none Level of assistance: Modified independent  Comments: Pain progression begins after 116ft   TREATMENT DATE 10/19/2024:                                                                                                                            -Myofascial trigger point release Rt gluteus medius, minimus -STS from elevated edge 3x10 (22)  -side stepping in // bars 4x28ft bilat 7.5lb cable resistance (increase in reps today)  *seated rest  -side stepping in // bars 4x77ft Left, 3x10 Left 9.5lb cable resistance (increase  in resistance today)  *seated rest  -side stepping in // bars 3x51ft bilat 9.5lb cable resistance (increase in resistance today)   PATIENT EDUCATION:  Education details: Talked about the relationship between bowel and bladder urgency pt is concerned about.  Person educated: Patient Education method: Chief Technology Officer Education comprehension: Good   HOME EXERCISE PROGRAM: Access Code: [email or web address] URL: [email or web address] Date: 09/28/2024 Prepared by: Reyes London  Exercises - Supine Gluteal Sets  - 3 x weekly - 3 sets - 10 reps - Supine Bridge  - 3 x weekly - 3 sets - 10 reps - Supine Bridge with Mini Swiss Ball Between Knees  - 3 x weekly - 3 sets - 10 reps  ASSESSMENT:  CLINICAL IMPRESSION: Myofascial trigger points persist overall, but remains smaller and easier to release each time pt returns. Continued with gluteal loading in high volume, able to increased reps and resistance today. STS volume also increased overall with no pain exacerbation. Patient will benefit from skilled physical therapy intervention to reduce deficits and impairments identified in evaluation, in  order to reduce pain, improve quality of life, and maximize activity tolerance for ADL, IADL, and leisure/fitness. Physical therapy will help pt achieve long and short term goals of care.   OBJECTIVE IMPAIRMENTS: Decreased knowledge of condition, decreased use of DME, decreased mobility, difficulty walking, decreased strength, decreased ROM. ACTIVITY LIMITATIONS: Lifting, standing, walking, squatting, transfers, locomotion level PARTICIPATION LIMITATIONS: Cleaning, laundry, interpersonal relationships, driving, yardwork, community activity.  PERSONAL FACTORS: Age, behavior pattern, education, past/current experiences, transportation, profession  are also affecting patient's functional outcome.  REHAB POTENTIAL: Good CLINICAL DECISION MAKING: Medium  EVALUATION COMPLEXITY: Moderate     GOALS: Goals reviewed with patient? No  SHORT TERM GOALS: Target date: 10/19/24  Pt to show >15% improvement on LEFS.  Baseline: Goal status: INITIAL  2.  Pt to demonstrate ability to AMB >320ft without increase in pain symptoms.  Baseline:  Goal status: INITIAL  3.  Pt reports compliance with home and/or gym program for BLE strengthening and ROM.  Baseline: wants to joint Well-zone with silver sneakers  Goal status: INITIAL  LONG TERM GOALS: Target date: 11/07/24  Pt to report no increase in Rt hip pain >3/10 over most recent 7 days.  Baseline: 10/19/24: at worst between 3-4/10 in past week.  Goal status: Partiall met  2.  Pt to report confidence in DC level HEP for continued hip strength and tissue maintenance.  Baseline:  Goal status: INITIAL   PLAN:  PT FREQUENCY: 1-2x/week  PT DURATION: 6 weeks  PLANNED INTERVENTIONS: 97110-Therapeutic exercises, 97530- Therapeutic activity, [id number]- Neuromuscular re-education, 97535- Self Care, [id number]- Manual therapy, [id number]- Gait training, [phone]- Orthotic Initial, [phone]- Orthotic/Prosthetic subsequent, [id number]- Electrical stimulation (unattended), [phone]- Electrical stimulation (manual), [id number]- Traction (mechanical), [id number]- Ionotophoresis 4mg /ml Dexamethasone , Patient/Family education, Balance training, Stair training, Spinal manipulation, Spinal mobilization, Cognitive remediation, DME instructions, Cryotherapy, and Moist heat.  PLAN FOR NEXT SESSION:  Continue with manual technique for pain relief Advance hip loading  4:31 PM, 10/19/2024 Peggye JAYSON Linear, PT, DPT Physical Therapist - [facility]  Outpatient Physical Therapy- [facility] [phone]     "

## 2024-10-21 ENCOUNTER — Encounter: Attending: Physical Medicine & Rehabilitation | Admitting: Physical Medicine & Rehabilitation

## 2024-10-21 ENCOUNTER — Encounter: Payer: Self-pay | Admitting: Physical Medicine & Rehabilitation

## 2024-10-21 VITALS — BP 115/62 | HR 74 | Ht 70.0 in | Wt 180.0 lb

## 2024-10-21 DIAGNOSIS — M47817 Spondylosis without myelopathy or radiculopathy, lumbosacral region: Secondary | ICD-10-CM | POA: Diagnosis not present

## 2024-10-21 NOTE — Patient Instructions (Signed)
 Will reassess in 6 weeks if pain starts worsening we may need to schedule for a repeat radiofrequency neurotomy

## 2024-10-21 NOTE — Progress Notes (Signed)
 "  Subjective:    Patient ID: Brandon Lewis, male    DOB: 07/19/1934, 89 y.o.   MRN: [id number]  HPI PT very helpful , does not know how he'd function without it . Performing HEP as well.   Pain scores reviewed still 3/10 (down from 7/10) Using heating pad , most of his pain is with walking but not as sharp.  Had a flare up mid December , lasted 3-4 days, that was after driving to [place].  We discussed prolonged sitting as something to avoid given his condition. No pain shooting down the legs. Reviewed physical therapy notes, trigger point noted at the right gluteus medius Pain Inventory Average Pain 3 Pain Right Now 3 My pain is sharp  In the last 24 hours, has pain interfered with the following? General activity 4 Relation with others 4 Enjoyment of life 4 What TIME of day is your pain at its worst? varies Sleep (in general) Good  Pain is worse with: walking Pain improves with: therapy/exercise Relief from Meds: .  Family History  Problem Relation Age of Onset   Cancer Mother    Diabetes Father        DM - and brother    Stroke Father    Coronary artery disease Paternal Grandfather    Colon cancer Neg Hx    Prostate cancer Neg Hx    Social History   Socioeconomic History   Marital status: Married    Spouse name: Not on file   Number of children: 3   Years of education: Not on file   Highest education level: Professional school degree (e.g., MD, DDS, DVM, JD)  Occupational History   Occupation: merchant [employer] officer    Comment: Retired  Tobacco Use   Smoking status: Former    Current packs/day: 0.00    Average packs/day: 1 pack/day for 20.0 years (20.0 ttl pk-yrs)    Types: Cigarettes    Start date: 10/20/1972    Quit date: 10/20/1992    Years since quitting: 32.0    Passive exposure: Never   Smokeless tobacco: Never   Tobacco comments:    quit in 1994   Vaping Use   Vaping status: Never Used  Substance and Sexual Activity   Alcohol use: Yes     Alcohol/week: 1.0 standard drink of alcohol    Types: 1 Standard drinks or equivalent per week    Comment: Occasionally at social functions   Drug use: Never   Sexual activity: Not Currently  Other Topics Concern   Not on file  Social History Narrative   Widowed; then remarried; 3 sons.      Has a living will    Son [name] should be health care POA---with wife's input   Would want resuscitation attempts   Would accept brief trial of artificial nutrition      Pt signed designated party release form and gives Tryson Lumley [phone] (home #), access to medical records. Can also leave msg on home answering machine. Cell # [phone]   Social Drivers of Health   Tobacco Use: Medium Risk (10/17/2024)   Patient History    Smoking Tobacco Use: Former    Smokeless Tobacco Use: Never    Passive Exposure: Never  Physicist, Medical Strain: Low Risk (09/12/2024)   Overall Financial Resource Strain (CARDIA)    Difficulty of Paying Living Expenses: Not very hard  Food Insecurity: No Food Insecurity (09/12/2024)   Epic    Worried About Radiation Protection Practitioner of [employer]  in the Last Year: Never true    Ran Out of Food in the Last Year: Never true  Transportation Needs: No Transportation Needs (09/12/2024)   Epic    Lack of Transportation (Medical): No    Lack of Transportation (Non-Medical): No  Physical Activity: Insufficiently Active (09/12/2024)   Exercise Vital Sign    Days of Exercise per Week: 1 day    Minutes of Exercise per Session: 30 min  Stress: No Stress Concern Present (09/12/2024)   [employer] of Occupational Health - Occupational Stress Questionnaire    Feeling of Stress: Not at all  Social Connections: Socially Integrated (09/12/2024)   Social Connection and Isolation Panel    Frequency of Communication with Friends and Family: More than three times a week    Frequency of Social Gatherings with Friends and Family: Twice a week    Attends Religious Services: 1 to 4 times per year     Active Member of Clubs or Organizations: Yes    Attends Banker Meetings: 1 to 4 times per year    Marital Status: Married  Depression (PHQ2-9): Low Risk (09/13/2024)   Depression (PHQ2-9)    PHQ-2 Score: 0  Alcohol Screen: Low Risk (09/12/2024)   Alcohol Screen    Last Alcohol Screening Score (AUDIT): 1  Housing: Low Risk (09/12/2024)   Epic    Unable to Pay for Housing in the Last Year: No    Number of Times Moved in the Last Year: 0    Homeless in the Last Year: No  Utilities: Not At Risk (05/03/2024)   Epic    Threatened with loss of utilities: No  Health Literacy: Adequate Health Literacy (05/03/2024)   B1300 Health Literacy    Frequency of need for help with medical instructions: Never   Past Surgical History:  Procedure Laterality Date   aorto-bifem  2003   [facility]es    APPENDECTOMY     CAROTID STENT     [place]   CATARACT EXTRACTION, BILATERAL  2017   CHOLECYSTECTOMY  2002   COLONOSCOPY     COLONOSCOPY WITH PROPOFOL  N/A 07/17/2021   Procedure: COLONOSCOPY WITH PROPOFOL ;  Surgeon: Therisa Bi, MD;  Location: [facility] ENDOSCOPY;  Service: Gastroenterology;  Laterality: N/A;   CORONARY ARTERY BYPASS GRAFT N/A 09/16/2019   Procedure: CORONARY ARTERY BYPASS GRAFTING (CABG) using LIMA to LAD; Endoscopic right saphenous vein harvest to OM1, Diag1, and RCA.;  Surgeon: German Bartlett PEDLAR, MD;  Location: [facility] OR;  Service: Open Heart Surgery;  Laterality: N/A;   CYSTOSCOPY WITH BIOPSY N/A 10/08/2021   Procedure: CYSTOSCOPY WITH BLADDER BIOPSY;  Surgeon: Twylla Glendia BROCKS, MD;  Location: [facility] ORS;  Service: Urology;  Laterality: N/A;   ENDARTERECTOMY FEMORAL Left 03/13/2021   Procedure: ENDARTERECTOMY FEMORAL (GROIN REVISION);  Surgeon: Jama Cordella MATSU, MD;  Location: [facility] ORS;  Service: Vascular;  Laterality: Left;   EYE SURGERY  Cataracts removed ou   LEFT HEART CATH AND CORONARY ANGIOGRAPHY N/A 09/12/2019   Procedure: LEFT HEART CATH AND CORONARY ANGIOGRAPHY;  Surgeon:  Hester Wolm PARAS, MD;  Location: [facility] INVASIVE CV LAB;  Service: Cardiovascular;  Laterality: N/A;   LITHOTRIPSY     LOWER EXTREMITY ANGIOGRAPHY Left 01/15/2021   Procedure: LOWER EXTREMITY ANGIOGRAPHY;  Surgeon: Jama Cordella MATSU, MD;  Location: [facility] INVASIVE CV LAB;  Service: Cardiovascular;  Laterality: Left;   SPINE SURGERY  1954   TEE WITHOUT CARDIOVERSION N/A 09/16/2019   Procedure: TRANSESOPHAGEAL ECHOCARDIOGRAM (TEE);  Surgeon: German Bartlett PEDLAR, MD;  Location: [facility] OR;  Service: Open Heart Surgery;  Laterality: N/A;   TONSILLECTOMY     TRANSURETHRAL RESECTION OF BLADDER TUMOR  10/08/2021   Procedure: TRANSURETHRAL RESECTION OF BLADDER TUMOR (TURBT);  Surgeon: Twylla Glendia BROCKS, MD;  Location: [facility] ORS;  Service: Urology;;   TRANSURETHRAL RESECTION OF BLADDER TUMOR WITH MITOMYCIN -C N/A 07/24/2020   Procedure: TRANSURETHRAL RESECTION OF BLADDER TUMOR WITH Gemcitabine ;  Surgeon: Twylla Glendia BROCKS, MD;  Location: [facility] ORS;  Service: Urology;  Laterality: N/A;   VASECTOMY  1976   WISDOM TOOTH EXTRACTION     Past Surgical History:  Procedure Laterality Date   aorto-bifem  2003   hayes    APPENDECTOMY     CAROTID STENT     cooper   CATARACT EXTRACTION, BILATERAL  2017   CHOLECYSTECTOMY  2002   COLONOSCOPY     COLONOSCOPY WITH PROPOFOL  N/A 07/17/2021   Procedure: COLONOSCOPY WITH PROPOFOL ;  Surgeon: Therisa Bi, MD;  Location: [facility] ENDOSCOPY;  Service: Gastroenterology;  Laterality: N/A;   CORONARY ARTERY BYPASS GRAFT N/A 09/16/2019   Procedure: CORONARY ARTERY BYPASS GRAFTING (CABG) using LIMA to LAD; Endoscopic right saphenous vein harvest to OM1, Diag1, and RCA.;  Surgeon: German Bartlett PEDLAR, MD;  Location: [facility] OR;  Service: Open Heart Surgery;  Laterality: N/A;   CYSTOSCOPY WITH BIOPSY N/A 10/08/2021   Procedure: CYSTOSCOPY WITH BLADDER BIOPSY;  Surgeon: Twylla Glendia BROCKS, MD;  Location: [facility] ORS;  Service: Urology;  Laterality: N/A;   ENDARTERECTOMY FEMORAL Left 03/13/2021    Procedure: ENDARTERECTOMY FEMORAL (GROIN REVISION);  Surgeon: Jama Cordella MATSU, MD;  Location: [facility] ORS;  Service: Vascular;  Laterality: Left;   EYE SURGERY  Cataracts removed ou   LEFT HEART CATH AND CORONARY ANGIOGRAPHY N/A 09/12/2019   Procedure: LEFT HEART CATH AND CORONARY ANGIOGRAPHY;  Surgeon: Hester Wolm PARAS, MD;  Location: [facility] INVASIVE CV LAB;  Service: Cardiovascular;  Laterality: N/A;   LITHOTRIPSY     LOWER EXTREMITY ANGIOGRAPHY Left 01/15/2021   Procedure: LOWER EXTREMITY ANGIOGRAPHY;  Surgeon: Jama Cordella MATSU, MD;  Location: [facility] INVASIVE CV LAB;  Service: Cardiovascular;  Laterality: Left;   SPINE SURGERY  1954   TEE WITHOUT CARDIOVERSION N/A 09/16/2019   Procedure: TRANSESOPHAGEAL ECHOCARDIOGRAM (TEE);  Surgeon: German Bartlett PEDLAR, MD;  Location[facility] OR;  Service: Open Heart Surgery;  Laterality: N/A;   TONSILLECTOMY     TRANSURETHRAL RESECTION OF BLADDER TUMOR  10/08/2021   Procedure: TRANSURETHRAL RESECTION OF BLADDER TUMOR (TURBT);  Surgeon: Twylla Glendia BROCKS, MD;  Location: [facility] ORS;  Service: Urology;;   TRANSURETHRAL RESECTION OF BLADDER TUMOR WITH MITOMYCIN -C N/A 07/24/2020   Procedure: TRANSURETHRAL RESECTION OF BLADDER TUMOR WITH Gemcitabine ;  Surgeon: Twylla Glendia BROCKS, MD;  Location: [facility] ORS;  Service: Urology;  Laterality: N/A;   VASECTOMY  1976   WISDOM TOOTH EXTRACTION     Past Medical History:  Diagnosis Date   Allergy side effects- NKDA   Anemia    Aortic atherosclerosis    Atrial flutter, paroxysmal (HCC)    a.) postoperatively following CABG   Bladder cancer (HCC) 04/2020   BPH (benign prostatic hyperplasia)    CAD (coronary artery disease) 09/12/2019   a.) LHC 09/12/2019: EF 55-60%; 85% m-dLM, 50% pLCx, 85% p-m LAD, 70% pLAD, 45% mLAD, 70% p-mRCA. b.) 4v CABG 09/16/2019.   CKD (chronic kidney disease), stage III (HCC)    Diabetes mellitus without complication (HCC)    Diverticulosis    GERD (gastroesophageal reflux disease)    History of  kidney stones  HTN (hypertension)    Hyperlipidemia    Long term current use of antithrombotics/antiplatelets    a.) DAPT therapies (ASA + clopidogrel )   NSTEMI (non-ST elevated myocardial infarction) (HCC) 09/11/2019   a.) LHC 09/12/2019: 85% m-dLM, 50% pLCx, 85% p-m LAD, 70% pLAD, 45% mLAD, 70% p-mRCA; transferred to [facility] for CVTS consult. Underwent 4v CABG on 09/16/2019.   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   a.) 4v CABG: LIMA-LAD, SVG-RCA, SVG-OM1, SVG-D1   Small bowel obstruction (HCC)    after surgeries   Thoracic ascending aortic aneurysm    a.) measured 4.1 cm by CT on 09/14/2019   BP 115/62   Pulse 74   Ht 5' 10 (1.778 m)   Wt 180 lb (81.6 kg)   SpO2 98%   BMI 25.83 kg/m   Opioid Risk Score:   Fall Risk Score:  `1  Depression screen PHQ 2/9     09/13/2024    1:52 PM 09/08/2024    1:31 PM 05/24/2024    2:06 PM 05/03/2024    2:22 PM 04/21/2024    1:07 PM 04/07/2024   12:27 PM 03/07/2024    2:09 PM  Depression screen PHQ 2/9  Decreased Interest 0 0 0 0 0 0 0  Down, Depressed, Hopeless 0 0 0 0 0 0 0  PHQ - 2 Score 0 0 0 0 0 0 0  Altered sleeping     0    Tired, decreased energy     0    Change in appetite     0    Feeling bad or failure about yourself      0    Trouble concentrating     0    Moving slowly or fidgety/restless     0    Suicidal thoughts     0    PHQ-9 Score     0        Data saved with a previous flowsheet row definition     Review of Systems  Musculoskeletal:  Positive for back pain.  All other systems reviewed and are negative.      Objective:   Physical Exam General No acute distress Mood and affect appropriate HEENT patient is hard of hearing Motor strength is 5/5 bilateral hip flexor knee extensor ankle  dorsiflexor Negative straight leg raising bilaterally Ambulates without assistive device forward flexed posture. Lumbar spine is reduced range of motion approximate 50% flexion only gets to neutral with extension. There is no tenderness palpation in the lumbar paraspinal area there is mild tenderness palpation of the right gluteus medius area as well as right flank area       Assessment & Plan:   1.  Lumbar spondylosis without myelopathy.  He has had good results with radiofrequency neurotomy.  His last right L3-4-5 RFA was performed 05/24/2024.  He went from 7/10 pain-free procedure to 3/10 postprocedure and has maintained this level except for a couple of exacerbations from prolonged sitting.  Will continue outpatient physical therapy.  I will reassess him in 6 weeks which will be approximately 6 months from the previous procedure and see if he has any gradual worsening of pain which would indicate need for repeat treatment "

## 2024-10-24 NOTE — Therapy (Signed)
 " OUTPATIENT PHYSICAL THERAPY TREATMENT  Patient Name: Brandon Lewis MRN: [id number] DOB:[birth year], 89 y.o., male Today's Date: 10/24/2024  END OF SESSION:     Past Medical History:  Diagnosis Date   Allergy side effects- NKDA   Anemia    Aortic atherosclerosis    Atrial flutter, paroxysmal (HCC)    a.) postoperatively following CABG   Bladder cancer (HCC) 04/2020   BPH (benign prostatic hyperplasia)    CAD (coronary artery disease) 09/12/2019   a.) LHC 09/12/2019: EF 55-60%; 85% m-dLM, 50% pLCx, 85% p-m LAD, 70% pLAD, 45% mLAD, 70% p-mRCA. b.) 4v CABG 09/16/2019.   CKD (chronic kidney disease), stage III (HCC)    Diabetes mellitus without complication (HCC)    Diverticulosis    GERD (gastroesophageal reflux disease)    History of kidney stones    HTN (hypertension)    Hyperlipidemia    Long term current use of antithrombotics/antiplatelets    a.) DAPT therapies (ASA + clopidogrel )   NSTEMI (non-ST elevated myocardial infarction) (HCC) 09/11/2019   a.) LHC 09/12/2019: 85% m-dLM, 50% pLCx, 85% p-m LAD, 70% pLAD, 45% mLAD, 70% p-mRCA; transferred to [facility] for CVTS consult. Underwent 4v CABG on 09/16/2019.   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   a.) 4v CABG: LIMA-LAD, SVG-RCA, SVG-OM1, SVG-D1   Small bowel obstruction (HCC)    after surgeries   Thoracic ascending aortic aneurysm    a.) measured 4.1 cm by CT on 09/14/2019   Past Surgical History:  Procedure Laterality Date   aorto-bifem  2003   [facility]    APPENDECTOMY     CAROTID STENT     [facility]   CATARACT EXTRACTION, BILATERAL  2017   CHOLECYSTECTOMY  2002   COLONOSCOPY     COLONOSCOPY WITH PROPOFOL  N/A 07/17/2021   Procedure: COLONOSCOPY WITH PROPOFOL ;  Surgeon: Therisa Bi, MD;  Location:  [facility] ENDOSCOPY;  Service: Gastroenterology;  Laterality: N/A;   CORONARY ARTERY BYPASS GRAFT N/A 09/16/2019   Procedure: CORONARY ARTERY BYPASS GRAFTING (CABG) using LIMA to LAD; Endoscopic right saphenous vein harvest to OM1, Diag1, and RCA.;  Surgeon: German Bartlett PEDLAR, MD;  Location: [facility] OR;  Service: Open Heart Surgery;  Laterality: N/A;   CYSTOSCOPY WITH BIOPSY N/A 10/08/2021   Procedure: CYSTOSCOPY WITH BLADDER BIOPSY;  Surgeon: Twylla Glendia BROCKS, MD;  Location: [facility] ORS;  Service: Urology;  Laterality: N/A;   ENDARTERECTOMY FEMORAL Left 03/13/2021   Procedure: ENDARTERECTOMY FEMORAL (GROIN REVISION);  Surgeon: Jama Cordella MATSU, MD;  Location: [facility] ORS;  Service: Vascular;  Laterality: Left;   EYE SURGERY  Cataracts removed ou   LEFT HEART CATH AND CORONARY ANGIOGRAPHY N/A 09/12/2019   Procedure: LEFT HEART CATH AND CORONARY ANGIOGRAPHY;  Surgeon: Hester Wolm PARAS, MD;  Location: [facility] INVASIVE CV LAB;  Service: Cardiovascular;  Laterality: N/A;   LITHOTRIPSY     LOWER EXTREMITY ANGIOGRAPHY Left 01/15/2021   Procedure: LOWER EXTREMITY ANGIOGRAPHY;  Surgeon: Jama Cordella MATSU, MD;  Location: [facility] INVASIVE CV LAB;  Service: Cardiovascular;  Laterality: Left;   SPINE SURGERY  1954   TEE WITHOUT CARDIOVERSION N/A 09/16/2019   Procedure: TRANSESOPHAGEAL ECHOCARDIOGRAM (TEE);  Surgeon: German Bartlett PEDLAR, MD;  Location: [facility] OR;  Service: Open Heart Surgery;  Laterality: N/A;   TONSILLECTOMY  TRANSURETHRAL RESECTION OF BLADDER TUMOR  10/08/2021   Procedure: TRANSURETHRAL RESECTION OF BLADDER TUMOR (TURBT);  Surgeon: Twylla Glendia BROCKS, MD;  Location: [facility] ORS;  Service: Urology;;   TRANSURETHRAL RESECTION OF BLADDER TUMOR WITH MITOMYCIN -C N/A 07/24/2020   Procedure: TRANSURETHRAL RESECTION OF BLADDER TUMOR WITH Gemcitabine ;  Surgeon: Twylla Glendia BROCKS, MD;  Location: [facility] ORS;  Service: Urology;  Laterality: N/A;   VASECTOMY  1976   WISDOM TOOTH EXTRACTION     Patient Active Problem List    Diagnosis Date Noted   Type 2 diabetes mellitus with diabetic nephropathy, without long-term current use of insulin  (HCC) 09/13/2024   Lumbar radiculopathy 04/07/2024   Bladder cancer (HCC) 05/22/2021   Anemia, chronic disease 05/22/2021   Aortic atherosclerosis 05/17/2020   BPH (benign prostatic hyperplasia) 11/16/2019   S/P CABG x 4 11/04/2019   Atrial flutter (HCC) 11/04/2019   Allergic rhinitis 09/12/2019   Coronary artery disease due to lipid rich plaque    Stage 3b chronic kidney disease (HCC) 09/14/2018   BCC (basal cell carcinoma), face 04/12/2018   Actinic keratosis 08/14/2015   Advanced directives, counseling/discussion 07/25/2014   Hyperlipemia 03/02/2009   Essential hypertension, benign 09/22/2007   PAD (peripheral artery disease) 09/22/2007   Osteoarthritis, generalized 09/22/2007    PCP: Bennett Reuben POUR, MD  REFERRING PROVIDER: Carilyn [name] BRAVO, MD   REFERRING DIAG: [phone] (ICD-10-CM) - Lumbosacral spondylosis without myelopathy   Rationale for Evaluation and Treatment: Rehabilitation  THERAPY DIAG:  Difficulty in walking, not elsewhere classified  Chronic right-sided low back pain without sciatica  ONSET DATE:   SUBJECTIVE:                                                                                                                                                                                           SUBJECTIVE STATEMENT: Pt doing generally well. He reports his pain has really been trending down overall.   PERTINENT HISTORY:  89yoM referred to OPPT for acute on chronic exacerbation of Rt low back pain. Pt has pain in the right hip near the Rt posterolateral belt loop. Pt denies any migration of symptoms, although they fluctuate in intensity, certainly worse when standing or walking. Pt reports pain as achy. Pt reports >5 years history without any specific inciting event. Pt has chronic AMB limitations due to claudication of the legs. Pt AMB without  device at baseline, reports no falls, no balance issues. Recent FU with vascular surgery in December shows no new blockage issues.   PAIN:  Are you having pain? ~1/10 today   PRECAUTIONS: None  WEIGHT BEARING RESTRICTIONS: No  FALLS:  Has patient fallen  in last 6 months?   OCCUPATION: Retired teacher, early years/pre   PLOF: Independent   PATIENT GOALS: Have less pain   OBJECTIVE:  Note: Objective measures were completed at Evaluation unless otherwise noted.  DIAGNOSTIC FINDINGS:  None  PATIENT SURVEYS:  None  HIP ROM:   ROM Assessment Hip 09/26/24     Right  Left   Flexion Supine 120 125  External rotation (90/90) 42 45  Internal Rotation (90/90) 22 18  Supine hip ABDCT  25 38     PALPATION:  Pt has sharp pain upon palpations of gluteus medius in 3 anatomical regions near the iliac crest, and Rt anterior gluteus minimus.   GAIT: Distance walked: 117ft Assistive device utilized: none Level of assistance: Modified independent  Comments: Pain progression begins after 176ft   TREATMENT DATE 10/24/2024:                                                                                                                            -Myofascial trigger point release Rt gluteus medius, minimus -STS from elevated edge 3x10 (22)  -side stepping in // bars 4x60ft bilat 7.5lb cable resistance (increase in reps today)  *seated rest  -side stepping in // bars 4x58ft Left, 3x10 Left 9.5lb cable resistance (increase in resistance today)  *seated rest  -side stepping in // bars 3x44ft bilat 9.5lb cable resistance (increase in resistance today)   PATIENT EDUCATION:  Education details: Talked about the relationship between bowel and bladder urgency pt is concerned about.  Person educated: Patient Education method: Chief Technology Officer Education comprehension: Good   HOME EXERCISE PROGRAM: Access Code: [email or web address] URL: [email or web address] Date: 09/28/2024 Prepared by: Reyes London  Exercises - Supine Gluteal Sets  - 3 x weekly - 3 sets - 10 reps - Supine Bridge  - 3 x weekly - 3 sets - 10 reps - Supine Bridge with Mini Swiss Ball Between Knees  - 3 x weekly - 3 sets - 10 reps  ASSESSMENT:  CLINICAL IMPRESSION: Myofascial trigger points persist overall, but remains smaller and easier to release each time pt returns. Continued with gluteal loading in high volume, able to increased reps and resistance today. STS volume also increased overall with no pain exacerbation. Patient will benefit from skilled physical therapy intervention to reduce deficits and impairments identified in evaluation, in order to reduce pain, improve quality of life, and maximize activity tolerance for ADL, IADL, and leisure/fitness. Physical therapy will help pt achieve long and short term goals of care.   OBJECTIVE IMPAIRMENTS: Decreased knowledge of condition, decreased use of DME, decreased mobility, difficulty walking, decreased strength, decreased ROM. ACTIVITY LIMITATIONS: Lifting, standing, walking, squatting, transfers, locomotion level PARTICIPATION LIMITATIONS: Cleaning, laundry, interpersonal relationships, driving, yardwork, community activity.  PERSONAL FACTORS: Age, behavior pattern, education, past/current experiences, transportation, profession  are also affecting patient's functional outcome.  REHAB POTENTIAL: Good CLINICAL DECISION MAKING: Medium  EVALUATION COMPLEXITY: Moderate    GOALS: Goals reviewed with patient? No  SHORT TERM GOALS: Target date: 10/19/24  Pt to show >15% improvement on LEFS.  Baseline: Goal status: INITIAL  2.  Pt to demonstrate ability to AMB >35ft without increase in pain symptoms.  Baseline:  Goal status: INITIAL  3.  Pt reports compliance with home and/or gym program for BLE strengthening and ROM.  Baseline: wants to joint Well-[employer] with silver sneakers  Goal status: INITIAL  LONG TERM GOALS: Target date: 11/07/24  Pt to  report no increase in Rt hip pain >3/10 over most recent 7 days.  Baseline: 10/19/24: at worst between 3-4/10 in past week.  Goal status: Partiall met  2.  Pt to report confidence in DC level HEP for continued hip strength and tissue maintenance.  Baseline:  Goal status: INITIAL   PLAN:  PT FREQUENCY: 1-2x/week  PT DURATION: 6 weeks  PLANNED INTERVENTIONS: 97110-Therapeutic exercises, 97530- Therapeutic activity, [id number]- Neuromuscular re-education, 97535- Self Care, [id number]- Manual therapy, [id number]- Gait training, [phone]- Orthotic Initial, [phone]- Orthotic/Prosthetic subsequent, H9716- Electrical stimulation (unattended), [phone]- Electrical stimulation (manual), [id number]- Traction (mechanical), [id number]- Ionotophoresis 4mg /ml Dexamethasone , Patient/Family education, Balance training, Stair training, Spinal manipulation, Spinal mobilization, Cognitive remediation, DME instructions, Cryotherapy, and Moist heat.  PLAN FOR NEXT SESSION:  Continue with manual technique for pain relief Advance hip loading  3:58 PM, 10/24/2024 Note: Portions of this document were prepared using Dragon voice recognition software and although reviewed may contain unintentional dictation errors in syntax, grammar, or spelling.  Lonni KATHEE Gainer PT ,DPT Physical Therapist- [place]  [facility]     "

## 2024-10-25 ENCOUNTER — Ambulatory Visit: Attending: Physical Medicine & Rehabilitation

## 2024-10-25 DIAGNOSIS — R262 Difficulty in walking, not elsewhere classified: Secondary | ICD-10-CM | POA: Insufficient documentation

## 2024-10-25 DIAGNOSIS — G8929 Other chronic pain: Secondary | ICD-10-CM | POA: Insufficient documentation

## 2024-10-25 DIAGNOSIS — M545 Low back pain, unspecified: Secondary | ICD-10-CM | POA: Insufficient documentation

## 2024-10-25 NOTE — Therapy (Signed)
 " OUTPATIENT PHYSICAL THERAPY TREATMENT  Patient Name: Brandon Lewis MRN: [id number] DOB:May 19, 1934, 89 y.o., male Today's Date: 10/25/2024  END OF SESSION:  PT End of Session - 10/25/24 1150     Visit Number 6    Number of Visits 12    Date for Recertification  11/07/24    Authorization Type [employer]; through 11/07/24    Authorization Time Period 12 visits between 12/8-12/25/24    Progress Note Due on Visit 10    PT Start Time 1145    PT Stop Time 1225    PT Time Calculation (min) 40 min    Activity Tolerance Patient tolerated treatment well;No increased pain    Behavior During Therapy WFL for tasks assessed/performed           Past Medical History:  Diagnosis Date   Allergy side effects- NKDA   Anemia    Aortic atherosclerosis    Atrial flutter, paroxysmal (HCC)    a.) postoperatively following CABG   Bladder cancer (HCC) 04/2020   BPH (benign prostatic hyperplasia)    CAD (coronary artery disease) 09/12/2019   a.) LHC 09/12/2019: EF 55-60%; 85% m-dLM, 50% pLCx, 85% p-m LAD, 70% pLAD, 45% mLAD, 70% p-mRCA. b.) 4v CABG 09/16/2019.   CKD (chronic kidney disease), stage III (HCC)    Diabetes mellitus without complication (HCC)    Diverticulosis    GERD (gastroesophageal reflux disease)    History of kidney stones    HTN (hypertension)    Hyperlipidemia    Long term current use of antithrombotics/antiplatelets    a.) DAPT therapies (ASA + clopidogrel )   NSTEMI (non-ST elevated myocardial infarction) (HCC) 09/11/2019   a.) LHC 09/12/2019: 85% m-dLM, 50% pLCx, 85% p-m LAD, 70% pLAD, 45% mLAD, 70% p-mRCA; transferred to [facility] for [facility] consult. Underwent 4v CABG on 09/16/2019.   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   a.) 4v CABG: LIMA-LAD,  SVG-RCA, SVG-OM1, SVG-D1   Small bowel obstruction (HCC)    after surgeries   Thoracic ascending aortic aneurysm    a.) measured 4.1 cm by CT on 09/14/2019   Past Surgical History:  Procedure Laterality Date   aorto-bifem  2003   [facility]    APPENDECTOMY     CAROTID STENT     [facility]   CATARACT EXTRACTION, BILATERAL  2017   CHOLECYSTECTOMY  2002   COLONOSCOPY     COLONOSCOPY WITH PROPOFOL  N/A 07/17/2021   Procedure: COLONOSCOPY WITH PROPOFOL ;  Surgeon: Therisa Bi, MD;  Location: [facility] ENDOSCOPY;  Service: Gastroenterology;  Laterality: N/A;   CORONARY ARTERY BYPASS GRAFT N/A 09/16/2019   Procedure: CORONARY ARTERY BYPASS GRAFTING (CABG) using LIMA to LAD; Endoscopic right saphenous vein harvest to OM1, Diag1, and RCA.;  Surgeon: German Bartlett PEDLAR, MD;  Location: [facility] OR;  Service: Open Heart Surgery;  Laterality: N/A;   CYSTOSCOPY WITH BIOPSY N/A 10/08/2021   Procedure: CYSTOSCOPY WITH BLADDER BIOPSY;  Surgeon: Twylla Glendia BROCKS, MD;  Location: [facility] ORS;  Service: Urology;  Laterality: N/A;   ENDARTERECTOMY FEMORAL Left 03/13/2021   Procedure: ENDARTERECTOMY FEMORAL (GROIN REVISION);  Surgeon: Jama Cordella MATSU, MD;  Location: [facility] ORS;  Service: Vascular;  Laterality: Left;   EYE SURGERY  Cataracts removed ou   LEFT HEART CATH AND CORONARY ANGIOGRAPHY  N/A 09/12/2019   Procedure: LEFT HEART CATH AND CORONARY ANGIOGRAPHY;  Surgeon: Hester Wolm PARAS, MD;  Location: [facility] INVASIVE CV LAB;  Service: Cardiovascular;  Laterality: N/A;   LITHOTRIPSY     LOWER EXTREMITY ANGIOGRAPHY Left 01/15/2021   Procedure: LOWER EXTREMITY ANGIOGRAPHY;  Surgeon: Jama Cordella MATSU, MD;  Location: [facility] INVASIVE CV LAB;  Service: Cardiovascular;  Laterality: Left;   SPINE SURGERY  1954   TEE WITHOUT CARDIOVERSION N/A 09/16/2019   Procedure: TRANSESOPHAGEAL ECHOCARDIOGRAM (TEE);  Surgeon: German Bartlett PEDLAR, MD;  Location: [facility] OR;  Service: Open Heart Surgery;  Laterality: N/A;   TONSILLECTOMY     TRANSURETHRAL  RESECTION OF BLADDER TUMOR  10/08/2021   Procedure: TRANSURETHRAL RESECTION OF BLADDER TUMOR (TURBT);  Surgeon: Twylla Glendia BROCKS, MD;  Location: [facility] ORS;  Service: Urology;;   TRANSURETHRAL RESECTION OF BLADDER TUMOR WITH MITOMYCIN -C N/A 07/24/2020   Procedure: TRANSURETHRAL RESECTION OF BLADDER TUMOR WITH Gemcitabine ;  Surgeon: Twylla Glendia BROCKS, MD;  Location: [facility] ORS;  Service: Urology;  Laterality: N/A;   VASECTOMY  1976   WISDOM TOOTH EXTRACTION     Patient Active Problem List   Diagnosis Date Noted   Type 2 diabetes mellitus with diabetic nephropathy, without long-term current use of insulin  (HCC) 09/13/2024   Lumbar radiculopathy 04/07/2024   Bladder cancer (HCC) 05/22/2021   Anemia, chronic disease 05/22/2021   Aortic atherosclerosis 05/17/2020   BPH (benign prostatic hyperplasia) 11/16/2019   S/P CABG x 4 11/04/2019   Atrial flutter (HCC) 11/04/2019   Allergic rhinitis 09/12/2019   Coronary artery disease due to lipid rich plaque    Stage 3b chronic kidney disease (HCC) 09/14/2018   BCC (basal cell carcinoma), face 04/12/2018   Actinic keratosis 08/14/2015   Advanced directives, counseling/discussion 07/25/2014   Hyperlipemia 03/02/2009   Essential hypertension, benign 09/22/2007   PAD (peripheral artery disease) 09/22/2007   Osteoarthritis, generalized 09/22/2007    PCP: Bennett Reuben POUR, MD  REFERRING PROVIDER: Carilyn [name] BRAVO, MD   REFERRING DIAG: [phone] (ICD-10-CM) - Lumbosacral spondylosis without myelopathy   Rationale for Evaluation and Treatment: Rehabilitation  THERAPY DIAG:  Difficulty in walking, not elsewhere classified  Chronic right-sided low back pain without sciatica  ONSET DATE:   SUBJECTIVE:                                                                                                                                                                                           SUBJECTIVE STATEMENT: Pt doing generally well. He reports his pain  has really been trending down overall.   PERTINENT HISTORY:  89yoM referred to OPPT for acute on chronic exacerbation of  Rt low back pain. Pt has pain in the right hip near the Rt posterolateral belt loop. Pt denies any migration of symptoms, although they fluctuate in intensity, certainly worse when standing or walking. Pt reports pain as achy. Pt reports >5 years history without any specific inciting event. Pt has chronic AMB limitations due to claudication of the legs. Pt AMB without device at baseline, reports no falls, no balance issues. Recent FU with vascular surgery in December shows no new blockage issues.   PAIN:  Are you having pain? ~1/10 today   PRECAUTIONS: None  WEIGHT BEARING RESTRICTIONS: No  FALLS:  Has patient fallen in last 6 months?   OCCUPATION: Retired teacher, early years/pre   PLOF: Independent   PATIENT GOALS: Have less pain   OBJECTIVE:  Note: Objective measures were completed at Evaluation unless otherwise noted.  DIAGNOSTIC FINDINGS:  None  PATIENT SURVEYS:  None  HIP ROM:   ROM Assessment Hip 09/26/24     Right  Left   Flexion Supine 120 125  External rotation (90/90) 42 45  Internal Rotation (90/90) 22 18  Supine hip ABDCT  25 38     PALPATION:  Pt has sharp pain upon palpations of gluteus medius in 3 anatomical regions near the iliac crest, and Rt anterior gluteus minimus.   GAIT: Distance walked: 137ft Assistive device utilized: none Level of assistance: Modified independent  Comments: Pain progression begins after 135ft   TREATMENT DATE 10/25/2024:                                                                                                                            -STS from elevated edge 2x12 (chair + airex)  *seated rest  -side stepping in // bars 4x33ft Left, 3x10 Left 9.5lb cable resistance (increase in resistance last session)  *seated rest  -STS from elevated edge 1x12 (chair + airex) (push off knees)  *seated rest  +ended session  here at pt request due to concerns of potential incontinence.  Pt taken to well[facility] to orient to staff and sign up process.   PATIENT EDUCATION:  Education details: Relayed response to session here with findings from physiatry note this week.  Person educated: Patient Education method: Chief Technology Officer Education comprehension: Good   HOME EXERCISE PROGRAM: Access Code: [email or web address] URL: [email or web address] Date: 09/28/2024 Prepared by: Reyes London  Exercises - Supine Gluteal Sets  - 3 x weekly - 3 sets - 10 reps - Supine Bridge  - 3 x weekly - 3 sets - 10 reps - Supine Bridge with Mini Swiss Ball Between Knees  - 3 x weekly - 3 sets - 10 reps  ASSESSMENT:  CLINICAL IMPRESSION: Skipped myofascial trigger points persist overall, but remains smaller and easier to release each time pt returns. Continued with gluteal loading in high volume, able to increased reps and resistance today. STS volume also increased overall with no pain exacerbation. Educated pt on likely being at plateau for pain at this  time, given known lumbar spine disease at L3-5, but would benefit from continued strengthening and transition to community gym setting eventually with some intermittent overlap. Patient will benefit from skilled physical therapy intervention to reduce deficits and impairments identified in evaluation, in order to reduce pain, improve quality of life, and maximize activity tolerance for ADL, IADL, and leisure/fitness. Physical therapy will help pt achieve long and short term goals of care.   OBJECTIVE IMPAIRMENTS: Decreased knowledge of condition, decreased use of DME, decreased mobility, difficulty walking, decreased strength, decreased ROM. ACTIVITY LIMITATIONS: Lifting, standing, walking, squatting, transfers, locomotion level PARTICIPATION LIMITATIONS: Cleaning, laundry, interpersonal relationships, driving, yardwork, community activity.  PERSONAL FACTORS: Age,  behavior pattern, education, past/current experiences, transportation, profession  are also affecting patient's functional outcome.  REHAB POTENTIAL: Good CLINICAL DECISION MAKING: Medium  EVALUATION COMPLEXITY: Moderate    GOALS: Goals reviewed with patient? No  SHORT TERM GOALS: Target date: 10/19/24  Pt to show >15% improvement on LEFS.  Baseline:  Goal status: ON GOING  2.  Pt to demonstrate ability to AMB >367ft without increase in pain symptoms.  Baseline:  Goal status: INITIAL  3.  Pt reports compliance with home and/or gym program for BLE strengthening and ROM.  Baseline: wants to joint [employer] with silver sneakers  Goal status: MET, with transition to gym in process   LONG TERM GOALS: Target date: 11/07/24  Pt to report no increase in Rt hip pain >3/10 over most recent 7 days.  Baseline: 10/19/24: at worst between 3-4/10 in past week.  Goal status: Partiall met  2.  Pt to report confidence in DC level HEP for continued hip strength and tissue maintenance.  Baseline:  Goal status: INITIAL   PLAN:  PT FREQUENCY: 1-2x/week  PT DURATION: 6 weeks  PLANNED INTERVENTIONS: 97110-Therapeutic exercises, 97530- Therapeutic activity, [id number]- Neuromuscular re-education, 97535- Self Care, 02859- Manual therapy, [id number]- Gait training, [phone]- Orthotic Initial, [phone]- Orthotic/Prosthetic subsequent, H9716- Electrical stimulation (unattended), [phone]- Electrical stimulation (manual), [id number]- Traction (mechanical), [id number]- Ionotophoresis 4mg /ml Dexamethasone , Patient/Family education, Balance training, Stair training, Spinal manipulation, Spinal mobilization, Cognitive remediation, DME instructions, Cryotherapy, and Moist heat.  PLAN FOR NEXT SESSION:  Continue with manual technique for pain relief Advance hip loading  11:55 AM, 10/25/2024 Peggye JAYSON Linear, PT, DPT Physical Therapist - [facility]  Outpatient Physical Therapy- [facility]  [facility] [phone]     "

## 2024-10-27 ENCOUNTER — Other Ambulatory Visit: Payer: Self-pay | Admitting: *Deleted

## 2024-10-27 ENCOUNTER — Ambulatory Visit: Admitting: Physical Therapy

## 2024-10-27 DIAGNOSIS — D649 Anemia, unspecified: Secondary | ICD-10-CM

## 2024-10-27 DIAGNOSIS — M545 Low back pain, unspecified: Secondary | ICD-10-CM

## 2024-10-27 DIAGNOSIS — R262 Difficulty in walking, not elsewhere classified: Secondary | ICD-10-CM

## 2024-10-27 DIAGNOSIS — R7689 Other specified abnormal immunological findings in serum: Secondary | ICD-10-CM

## 2024-10-27 NOTE — Therapy (Signed)
 " OUTPATIENT PHYSICAL THERAPY TREATMENT  Patient Name: Brandon Lewis MRN: [id number] DOB:01-15-1934, 89 y.o., male Today's Date: 10/27/2024  END OF SESSION:  PT End of Session - 10/27/24 1448     Visit Number 7    Number of Visits 12    Date for Recertification  11/07/24    Authorization Type [employer]; through 11/07/24    Authorization Time Period 12 visits between 12/8-12/25/24    Progress Note Due on Visit 10    PT Start Time 1450    PT Stop Time 1530    PT Time Calculation (min) 40 min    Activity Tolerance Patient tolerated treatment well;No increased pain    Behavior During Therapy WFL for tasks assessed/performed           Past Medical History:  Diagnosis Date   Allergy side effects- NKDA   Anemia    Aortic atherosclerosis    Atrial flutter, paroxysmal (HCC)    a.) postoperatively following CABG   Bladder cancer (HCC) 04/2020   BPH (benign prostatic hyperplasia)    CAD (coronary artery disease) 09/12/2019   a.) LHC 09/12/2019: EF 55-60%; 85% m-dLM, 50% pLCx, 85% p-m LAD, 70% pLAD, 45% mLAD, 70% p-mRCA. b.) 4v CABG 09/16/2019.   CKD (chronic kidney disease), stage III (HCC)    Diabetes mellitus without complication (HCC)    Diverticulosis    GERD (gastroesophageal reflux disease)    History of kidney stones    HTN (hypertension)    Hyperlipidemia    Long term current use of antithrombotics/antiplatelets    a.) DAPT therapies (ASA + clopidogrel )   NSTEMI (non-ST elevated myocardial infarction) (HCC) 09/11/2019   a.) LHC 09/12/2019: 85% m-dLM, 50% pLCx, 85% p-m LAD, 70% pLAD, 45% mLAD, 70% p-mRCA; transferred to [facility] for [facility] consult. Underwent 4v CABG on 09/16/2019.   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   a.) 4v CABG: LIMA-LAD,  SVG-RCA, SVG-OM1, SVG-D1   Small bowel obstruction (HCC)    after surgeries   Thoracic ascending aortic aneurysm    a.) measured 4.1 cm by CT on 09/14/2019   Past Surgical History:  Procedure Laterality Date   aorto-bifem  2003   [facility]    APPENDECTOMY     CAROTID STENT     [facility]   CATARACT EXTRACTION, BILATERAL  2017   CHOLECYSTECTOMY  2002   COLONOSCOPY     COLONOSCOPY WITH PROPOFOL  N/A 07/17/2021   Procedure: COLONOSCOPY WITH PROPOFOL ;  Surgeon: Therisa Bi, MD;  Location: [facility] ENDOSCOPY;  Service: Gastroenterology;  Laterality: N/A;   CORONARY ARTERY BYPASS GRAFT N/A 09/16/2019   Procedure: CORONARY ARTERY BYPASS GRAFTING (CABG) using LIMA to LAD; Endoscopic right saphenous vein harvest to OM1, Diag1, and RCA.;  Surgeon: German Bartlett PEDLAR, MD;  Location: [facility] OR;  Service: Open Heart Surgery;  Laterality: N/A;   CYSTOSCOPY WITH BIOPSY N/A 10/08/2021   Procedure: CYSTOSCOPY WITH BLADDER BIOPSY;  Surgeon: Twylla Glendia BROCKS, MD;  Location: [facility] ORS;  Service: Urology;  Laterality: N/A;   ENDARTERECTOMY FEMORAL Left 03/13/2021   Procedure: ENDARTERECTOMY FEMORAL (GROIN REVISION);  Surgeon: Jama Cordella MATSU, MD;  Location: [facility] ORS;  Service: Vascular;  Laterality: Left;   EYE SURGERY  Cataracts removed ou   LEFT HEART CATH AND CORONARY ANGIOGRAPHY  N/A 09/12/2019   Procedure: LEFT HEART CATH AND CORONARY ANGIOGRAPHY;  Surgeon: Hester Wolm PARAS, MD;  Location: [facility] INVASIVE CV LAB;  Service: Cardiovascular;  Laterality: N/A;   LITHOTRIPSY     LOWER EXTREMITY ANGIOGRAPHY Left 01/15/2021   Procedure: LOWER EXTREMITY ANGIOGRAPHY;  Surgeon: Jama Cordella MATSU, MD;  Location: [facility] INVASIVE CV LAB;  Service: Cardiovascular;  Laterality: Left;   SPINE SURGERY  1954   TEE WITHOUT CARDIOVERSION N/A 09/16/2019   Procedure: TRANSESOPHAGEAL ECHOCARDIOGRAM (TEE);  Surgeon: German Bartlett PEDLAR, MD;  Location: [facility] OR;  Service: Open Heart Surgery;  Laterality: N/A;   TONSILLECTOMY     TRANSURETHRAL  RESECTION OF BLADDER TUMOR  10/08/2021   Procedure: TRANSURETHRAL RESECTION OF BLADDER TUMOR (TURBT);  Surgeon: Twylla Glendia BROCKS, MD;  Location: [facility] ORS;  Service: Urology;;   TRANSURETHRAL RESECTION OF BLADDER TUMOR WITH MITOMYCIN -C N/A 07/24/2020   Procedure: TRANSURETHRAL RESECTION OF BLADDER TUMOR WITH Gemcitabine ;  Surgeon: Twylla Glendia BROCKS, MD;  Location: [facility] ORS;  Service: Urology;  Laterality: N/A;   VASECTOMY  1976   WISDOM TOOTH EXTRACTION     Patient Active Problem List   Diagnosis Date Noted   Type 2 diabetes mellitus with diabetic nephropathy, without long-term current use of insulin  (HCC) 09/13/2024   Lumbar radiculopathy 04/07/2024   Bladder cancer (HCC) 05/22/2021   Anemia, chronic disease 05/22/2021   Aortic atherosclerosis 05/17/2020   BPH (benign prostatic hyperplasia) 11/16/2019   S/P CABG x 4 11/04/2019   Atrial flutter (HCC) 11/04/2019   Allergic rhinitis 09/12/2019   Coronary artery disease due to lipid rich plaque    Stage 3b chronic kidney disease (HCC) 09/14/2018   BCC (basal cell carcinoma), face 04/12/2018   Actinic keratosis 08/14/2015   Advanced directives, counseling/discussion 07/25/2014   Hyperlipemia 03/02/2009   Essential hypertension, benign 09/22/2007   PAD (peripheral artery disease) 09/22/2007   Osteoarthritis, generalized 09/22/2007    PCP: Bennett Reuben POUR, MD  REFERRING PROVIDER: Carilyn [name] BRAVO, MD   REFERRING DIAG: [phone] (ICD-10-CM) - Lumbosacral spondylosis without myelopathy   Rationale for Evaluation and Treatment: Rehabilitation  THERAPY DIAG:  Difficulty in walking, not elsewhere classified  Chronic right-sided low back pain without sciatica  ONSET DATE:   SUBJECTIVE:                                                                                                                                                                                           SUBJECTIVE STATEMENT: Pt doing generally well. States that his hip  pain is doing about the same since last session, but generally better.   PERTINENT HISTORY:  89yoM referred to OPPT  for acute on chronic exacerbation of Rt low back pain. Pt has pain in the right hip near the Rt posterolateral belt loop. Pt denies any migration of symptoms, although they fluctuate in intensity, certainly worse when standing or walking. Pt reports pain as achy. Pt reports >5 years history without any specific inciting event. Pt has chronic AMB limitations due to claudication of the legs. Pt AMB without device at baseline, reports no falls, no balance issues. Recent FU with vascular surgery in December shows no new blockage issues.   PAIN:  Are you having pain? ~1/10 today   PRECAUTIONS: None  WEIGHT BEARING RESTRICTIONS: No  FALLS:  Has patient fallen in last 6 months?   OCCUPATION: Retired teacher, early years/pre   PLOF: Independent   PATIENT GOALS: Have less pain   OBJECTIVE:  Note: Objective measures were completed at Evaluation unless otherwise noted.  DIAGNOSTIC FINDINGS:  None  PATIENT SURVEYS:  None  HIP ROM:   ROM Assessment Hip 09/26/24     Right  Left   Flexion Supine 120 125  External rotation (90/90) 42 45  Internal Rotation (90/90) 22 18  Supine hip ABDCT  25 38     PALPATION:  Pt has sharp pain upon palpations of gluteus medius in 3 anatomical regions near the iliac crest, and Rt anterior gluteus minimus.   GAIT: Distance walked: 132ft Assistive device utilized: none Level of assistance: Modified independent  Comments: Pain progression begins after 162ft   TREATMENT DATE 10/27/2024:                                                                                                                              -STS from elevated edge 12 + 6 (chair + airex)  *seated rest  - supine clam shell BLE with RTB 2 x 10  - Sidelying clam shell 2 x 10 with TrP release on second bout  - sidelying hip abduction 2 x 10  - bridge x 6 with neutral knee position    Significant STM to R gluteal complex with multiple taught bands and TrP noted on in gluteal insertion near iliac crest at belt loop as well as In piriformis region.   Pt reports decreased tension in the R hip upon completion of STM on this day.   PATIENT EDUCATION:  Education details: Relayed response to session here with findings from physiatry note this week.  Person educated: Patient Education method: Chief Technology Officer Education comprehension: Good   HOME EXERCISE PROGRAM: Access Code: [email or web address] URL: [email or web address] Date: 10/28/2024 Prepared by: Massie Dollar  Exercises - Supine Gluteal Sets  - 3 x weekly - 3 sets - 10 reps - Supine Bridge  - 3 x weekly - 3 sets - 10 reps - Supine Bridge with Mini Swiss Ball Between Knees  - 3 x weekly - 3 sets - 10 reps - Clamshell  - 1 x daily - 3 x weekly - 3 sets - 10 reps -  Sidelying Hip Abduction  - 1 x daily - 3 x weekly - 3 sets - 10 reps - Sit to Stand with Arms Crossed  - 1 x daily - 3 x weekly - 3 sets - 7 reps  ASSESSMENT:  CLINICAL IMPRESSION: Continued to focused treatment on increased gluteal loading and activation on this day significant STM to the R gluteal structures for pain relief to improve activation gluteal complex with functional movement patterns. Reports decreased pain/stiffness upon completion of STM.  Patient will benefit from skilled physical therapy intervention to reduce deficits and impairments identified in evaluation, in order to reduce pain, improve quality of life, and maximize activity tolerance for ADL, IADL, and leisure/fitness. Physical therapy will help pt achieve long and short term goals of care.   OBJECTIVE IMPAIRMENTS: Decreased knowledge of condition, decreased use of DME, decreased mobility, difficulty walking, decreased strength, decreased ROM. ACTIVITY LIMITATIONS: Lifting, standing, walking, squatting, transfers, locomotion level PARTICIPATION LIMITATIONS: Cleaning,  laundry, interpersonal relationships, driving, yardwork, community activity.  PERSONAL FACTORS: Age, behavior pattern, education, past/current experiences, transportation, profession  are also affecting patient's functional outcome.  REHAB POTENTIAL: Good CLINICAL DECISION MAKING: Medium  EVALUATION COMPLEXITY: Moderate    GOALS: Goals reviewed with patient? No  SHORT TERM GOALS: Target date: 10/19/24  Pt to show >15% improvement on LEFS.  Baseline:  Goal status: ON GOING  2.  Pt to demonstrate ability to AMB >346ft without increase in pain symptoms.  Baseline:  Goal status: INITIAL  3.  Pt reports compliance with home and/or gym program for BLE strengthening and ROM.  Baseline: wants to joint [employer] with silver sneakers  Goal status: MET, with transition to gym in process   LONG TERM GOALS: Target date: 11/07/24  Pt to report no increase in Rt hip pain >3/10 over most recent 7 days.  Baseline: 10/19/24: at worst between 3-4/10 in past week.  Goal status: Partiall met  2.  Pt to report confidence in DC level HEP for continued hip strength and tissue maintenance.  Baseline:  Goal status: INITIAL   PLAN:  PT FREQUENCY: 1-2x/week  PT DURATION: 6 weeks  PLANNED INTERVENTIONS: 97110-Therapeutic exercises, 97530- Therapeutic activity, [id number]- Neuromuscular re-education, 97535- Self Care, 02859- Manual therapy, [id number]- Gait training, [phone]- Orthotic Initial, [phone]- Orthotic/Prosthetic subsequent, H9716- Electrical stimulation (unattended), [phone]- Electrical stimulation (manual), [id number]- Traction (mechanical), [id number]- Ionotophoresis 4mg /ml Dexamethasone , Patient/Family education, Balance training, Stair training, Spinal manipulation, Spinal mobilization, Cognitive remediation, DME instructions, Cryotherapy, and Moist heat.  PLAN FOR NEXT SESSION:   Continue with manual technique for pain relief Advance hip loading     Massie Dollar PT, DPT  Physical Therapist - [facility]   [facility]  9:28 AM 10/28/2024   "

## 2024-10-29 ENCOUNTER — Encounter (INDEPENDENT_AMBULATORY_CARE_PROVIDER_SITE_OTHER): Payer: Self-pay | Admitting: Vascular Surgery

## 2024-10-31 ENCOUNTER — Ambulatory Visit: Admitting: Physical Therapy

## 2024-10-31 DIAGNOSIS — M545 Low back pain, unspecified: Secondary | ICD-10-CM

## 2024-10-31 DIAGNOSIS — R262 Difficulty in walking, not elsewhere classified: Secondary | ICD-10-CM

## 2024-10-31 NOTE — Therapy (Unsigned)
 " OUTPATIENT PHYSICAL THERAPY TREATMENT  Patient Name: Brandon Lewis MRN: [id number] DOB:February 14, 1934, 89 y.o., male Today's Date: 10/31/2024  END OF SESSION:  PT End of Session - 10/31/24 1600     Visit Number 8    Number of Visits 12    Date for Recertification  11/07/24    Authorization Type [employer]; through 11/07/24    Authorization Time Period 12 visits between 12/8-12/25/24    Progress Note Due on Visit 10    PT Start Time 1615    PT Stop Time 1700    PT Time Calculation (min) 45 min    Activity Tolerance Patient tolerated treatment well;No increased pain    Behavior During Therapy WFL for tasks assessed/performed           Past Medical History:  Diagnosis Date   Allergy side effects- NKDA   Anemia    Aortic atherosclerosis    Atrial flutter, paroxysmal (HCC)    a.) postoperatively following CABG   Bladder cancer (HCC) 04/2020   BPH (benign prostatic hyperplasia)    CAD (coronary artery disease) 09/12/2019   a.) LHC 09/12/2019: EF 55-60%; 85% m-dLM, 50% pLCx, 85% p-m LAD, 70% pLAD, 45% mLAD, 70% p-mRCA. b.) 4v CABG 09/16/2019.   CKD (chronic kidney disease), stage III (HCC)    Diabetes mellitus without complication (HCC)    Diverticulosis    GERD (gastroesophageal reflux disease)    History of kidney stones    HTN (hypertension)    Hyperlipidemia    Long term current use of antithrombotics/antiplatelets    a.) DAPT therapies (ASA + clopidogrel )   NSTEMI (non-ST elevated myocardial infarction) (HCC) 09/11/2019   a.) LHC 09/12/2019: 85% m-dLM, 50% pLCx, 85% p-m LAD, 70% pLAD, 45% mLAD, 70% p-mRCA; transferred to [facility] for CVTS consult. Underwent 4v CABG on 09/16/2019.   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   a.) 4v CABG: LIMA-LAD,  SVG-RCA, SVG-OM1, SVG-D1   Small bowel obstruction (HCC)    after surgeries   Thoracic ascending aortic aneurysm    a.) measured 4.1 cm by CT on 09/14/2019   Past Surgical History:  Procedure Laterality Date   aorto-bifem  2003   [facility]    APPENDECTOMY     CAROTID STENT     [facility]   CATARACT EXTRACTION, BILATERAL  2017   CHOLECYSTECTOMY  2002   COLONOSCOPY     COLONOSCOPY WITH PROPOFOL  N/A 07/17/2021   Procedure: COLONOSCOPY WITH PROPOFOL ;  Surgeon: Therisa Bi, MD;  Location: [facility] ENDOSCOPY;  Service: Gastroenterology;  Laterality: N/A;   CORONARY ARTERY BYPASS GRAFT N/A 09/16/2019   Procedure: CORONARY ARTERY BYPASS GRAFTING (CABG) using LIMA to LAD; Endoscopic right saphenous vein harvest to OM1, Diag1, and RCA.;  Surgeon: German Bartlett PEDLAR, MD;  Location: [facility] OR;  Service: Open Heart Surgery;  Laterality: N/A;   CYSTOSCOPY WITH BIOPSY N/A 10/08/2021   Procedure: CYSTOSCOPY WITH BLADDER BIOPSY;  Surgeon: Twylla Glendia BROCKS, MD;  Location: [facility] ORS;  Service: Urology;  Laterality: N/A;   ENDARTERECTOMY FEMORAL Left 03/13/2021   Procedure: ENDARTERECTOMY FEMORAL (GROIN REVISION);  Surgeon: Jama Cordella MATSU, MD;  Location: [facility] ORS;  Service: Vascular;  Laterality: Left;   EYE SURGERY  Cataracts removed ou   LEFT HEART CATH AND CORONARY ANGIOGRAPHY  N/A 09/12/2019   Procedure: LEFT HEART CATH AND CORONARY ANGIOGRAPHY;  Surgeon: Hester Wolm PARAS, MD;  Location: [facility] INVASIVE CV LAB;  Service: Cardiovascular;  Laterality: N/A;   LITHOTRIPSY     LOWER EXTREMITY ANGIOGRAPHY Left 01/15/2021   Procedure: LOWER EXTREMITY ANGIOGRAPHY;  Surgeon: Jama Cordella MATSU, MD;  Location: [facility] INVASIVE CV LAB;  Service: Cardiovascular;  Laterality: Left;   SPINE SURGERY  1954   TEE WITHOUT CARDIOVERSION N/A 09/16/2019   Procedure: TRANSESOPHAGEAL ECHOCARDIOGRAM (TEE);  Surgeon: German Bartlett PEDLAR, MD;  Location: [facility] OR;  Service: Open Heart Surgery;  Laterality: N/A;   TONSILLECTOMY     TRANSURETHRAL  RESECTION OF BLADDER TUMOR  10/08/2021   Procedure: TRANSURETHRAL RESECTION OF BLADDER TUMOR (TURBT);  Surgeon: Twylla Glendia BROCKS, MD;  Location: [facility] ORS;  Service: Urology;;   TRANSURETHRAL RESECTION OF BLADDER TUMOR WITH MITOMYCIN -C N/A 07/24/2020   Procedure: TRANSURETHRAL RESECTION OF BLADDER TUMOR WITH Gemcitabine ;  Surgeon: Twylla Glendia BROCKS, MD;  Location: [facility] ORS;  Service: Urology;  Laterality: N/A;   VASECTOMY  1976   WISDOM TOOTH EXTRACTION     Patient Active Problem List   Diagnosis Date Noted   Type 2 diabetes mellitus with diabetic nephropathy, without long-term current use of insulin  (HCC) 09/13/2024   Lumbar radiculopathy 04/07/2024   Bladder cancer (HCC) 05/22/2021   Anemia, chronic disease 05/22/2021   Aortic atherosclerosis 05/17/2020   BPH (benign prostatic hyperplasia) 11/16/2019   S/P CABG x 4 11/04/2019   Atrial flutter (HCC) 11/04/2019   Allergic rhinitis 09/12/2019   Coronary artery disease due to lipid rich plaque    Stage 3b chronic kidney disease (HCC) 09/14/2018   BCC (basal cell carcinoma), face 04/12/2018   Actinic keratosis 08/14/2015   Advanced directives, counseling/discussion 07/25/2014   Hyperlipemia 03/02/2009   Essential hypertension, benign 09/22/2007   PAD (peripheral artery disease) 09/22/2007   Osteoarthritis, generalized 09/22/2007    PCP: Bennett Reuben POUR, MD  REFERRING PROVIDER: Carilyn [name] BRAVO, MD   REFERRING DIAG: [phone] (ICD-10-CM) - Lumbosacral spondylosis without myelopathy   Rationale for Evaluation and Treatment: Rehabilitation  THERAPY DIAG:  Difficulty in walking, not elsewhere classified  Chronic right-sided low back pain without sciatica  ONSET DATE:   SUBJECTIVE:                                                                                                                                                                                           SUBJECTIVE STATEMENT: Pt doing generally well. Feeling sore in the  R hip, but not painful.   PERTINENT HISTORY:  89yoM referred to OPPT for acute on chronic exacerbation of Rt  low back pain. Pt has pain in the right hip near the Rt posterolateral belt loop. Pt denies any migration of symptoms, although they fluctuate in intensity, certainly worse when standing or walking. Pt reports pain as achy. Pt reports >5 years history without any specific inciting event. Pt has chronic AMB limitations due to claudication of the legs. Pt AMB without device at baseline, reports no falls, no balance issues. Recent FU with vascular surgery in December shows no new blockage issues.   PAIN:  Are you having pain? ~1/10 today   PRECAUTIONS: None  WEIGHT BEARING RESTRICTIONS: No  FALLS:  Has patient fallen in last 6 months?   OCCUPATION: Retired teacher, early years/pre   PLOF: Independent   PATIENT GOALS: Have less pain   OBJECTIVE:  Note: Objective measures were completed at Evaluation unless otherwise noted.  DIAGNOSTIC FINDINGS:  None  PATIENT SURVEYS:  None  HIP ROM:   ROM Assessment Hip 09/26/24     Right  Left   Flexion Supine 120 125  External rotation (90/90) 42 45  Internal Rotation (90/90) 22 18  Supine hip ABDCT  25 38     PALPATION:  Pt has sharp pain upon palpations of gluteus medius in 3 anatomical regions near the iliac crest, and Rt anterior gluteus minimus.   GAIT: Distance walked: 178ft Assistive device utilized: none Level of assistance: Modified independent  Comments: Pain progression begins after 16ft   TREATMENT DATE 10/31/2024:                                                                                                                             STS 10x3 with 1 min rest break between sets  *seated rest  -side stepping in // bars 4x80ft bilat 7.5lb cable resistance  *seated rest  - side stepping in // bars 5 x 28ft with 5# AW on BLE.  *seated rest  - lateral lunge to 6 inch step with 4# AW  x 10 bil  *seated rest break -Forward  step up 6 inch steps with 4# AW x 8 bil  *seated rest break -forward resisted gait with 12.5# from matrix cable 27ftx 5   Pt reports decreased tension in the Hip on this day so STM foregone on this day.   PATIENT EDUCATION:  Education details: Relayed response to session here with findings from physiatry note this week.  Person educated: Patient Education method: Chief Technology Officer Education comprehension: Good   HOME EXERCISE PROGRAM: Access Code: [email or web address] URL: [email or web address] Date: 10/28/2024 Prepared by: Massie Dollar  Exercises - Supine Gluteal Sets  - 3 x weekly - 3 sets - 10 reps - Supine Bridge  - 3 x weekly - 3 sets - 10 reps - Supine Bridge with Mini Swiss Ball Between Knees  - 3 x weekly - 3 sets - 10 reps - Clamshell  - 1 x daily - 3 x weekly - 3 sets - 10 reps - Sidelying Hip Abduction  -  1 x daily - 3 x weekly - 3 sets - 10 reps - Sit to Stand with Arms Crossed  - 1 x daily - 3 x weekly - 3 sets - 7 reps  ASSESSMENT:  CLINICAL IMPRESSION: Continued to focused treatment on increased gluteal loading and activation on this day.  Pt reports decreased tension in the Hip on this day so STM foregone on this day.  Tolerated all increased loading well with only mild weakness noted on the RLE. patient will benefit from skilled physical therapy intervention to reduce deficits and impairments identified in evaluation, in order to reduce pain, improve quality of life, and maximize activity tolerance for ADL, IADL, and leisure/fitness. Physical therapy will help pt achieve long and short term goals of care.   OBJECTIVE IMPAIRMENTS: Decreased knowledge of condition, decreased use of DME, decreased mobility, difficulty walking, decreased strength, decreased ROM. ACTIVITY LIMITATIONS: Lifting, standing, walking, squatting, transfers, locomotion level PARTICIPATION LIMITATIONS: Cleaning, laundry, interpersonal relationships, driving, yardwork, community activity.   PERSONAL FACTORS: Age, behavior pattern, education, past/current experiences, transportation, profession  are also affecting patient's functional outcome.  REHAB POTENTIAL: Good CLINICAL DECISION MAKING: Medium  EVALUATION COMPLEXITY: Moderate    GOALS: Goals reviewed with patient? No  SHORT TERM GOALS: Target date: 10/19/24  Pt to show >15% improvement on LEFS.  Baseline:  Goal status: ON GOING  2.  Pt to demonstrate ability to AMB >313ft without increase in pain symptoms.  Baseline:  Goal status: INITIAL  3.  Pt reports compliance with home and/or gym program for BLE strengthening and ROM.  Baseline: wants to joint Well-zone with silver sneakers  Goal status: MET, with transition to gym in process   LONG TERM GOALS: Target date: 11/07/24  Pt to report no increase in Rt hip pain >3/10 over most recent 7 days.  Baseline: 10/19/24: at worst between 3-4/10 in past week.  Goal status: Partiall met  2.  Pt to report confidence in DC level HEP for continued hip strength and tissue maintenance.  Baseline:  Goal status: INITIAL   PLAN:  PT FREQUENCY: 1-2x/week  PT DURATION: 6 weeks  PLANNED INTERVENTIONS: 97110-Therapeutic exercises, 97530- Therapeutic activity, [id number]- Neuromuscular re-education, 97535- Self Care, 02859- Manual therapy, [phone]- Gait training, [phone]- Orthotic Initial, [phone]- Orthotic/Prosthetic subsequent, H9716- Electrical stimulation (unattended), [phone]- Electrical stimulation (manual), [id number]- Traction (mechanical), [id number]- Ionotophoresis 4mg /ml Dexamethasone , Patient/Family education, Balance training, Stair training, Spinal manipulation, Spinal mobilization, Cognitive remediation, DME instructions, Cryotherapy, and Moist heat.  PLAN FOR NEXT SESSION:   Continue with manual technique for pain relief Advance hip loading     Massie Dollar PT, DPT  Physical Therapist - [facility]  4:23 PM 10/31/2024   "

## 2024-11-02 ENCOUNTER — Ambulatory Visit: Admitting: Physical Therapy

## 2024-11-02 ENCOUNTER — Other Ambulatory Visit: Payer: Self-pay | Admitting: Oncology

## 2024-11-02 DIAGNOSIS — D631 Anemia in chronic kidney disease: Secondary | ICD-10-CM | POA: Insufficient documentation

## 2024-11-02 DIAGNOSIS — R262 Difficulty in walking, not elsewhere classified: Secondary | ICD-10-CM

## 2024-11-02 DIAGNOSIS — M545 Low back pain, unspecified: Secondary | ICD-10-CM

## 2024-11-02 NOTE — Therapy (Signed)
 " OUTPATIENT PHYSICAL THERAPY TREATMENT  Patient Name: Brandon Lewis MRN: [id number] DOB:09-28-1934, 89 y.o., male Today's Date: 11/02/2024  END OF SESSION:  PT End of Session - 11/02/24 1402     Visit Number 9    Number of Visits 12    Date for Recertification  11/07/24    Authorization Type [employer]; through 11/07/24    Authorization Time Period 12 visits between 12/8-12/25/24    Progress Note Due on Visit 10    PT Start Time 1404    PT Stop Time 1444    PT Time Calculation (min) 40 min    Activity Tolerance Patient tolerated treatment well;No increased pain    Behavior During Therapy WFL for tasks assessed/performed           Past Medical History:  Diagnosis Date   Allergy side effects- NKDA   Anemia    Aortic atherosclerosis    Atrial flutter, paroxysmal (HCC)    a.) postoperatively following CABG   Bladder cancer (HCC) 04/2020   BPH (benign prostatic hyperplasia)    CAD (coronary artery disease) 09/12/2019   a.) LHC 09/12/2019: EF 55-60%; 85% m-dLM, 50% pLCx, 85% p-m LAD, 70% pLAD, 45% mLAD, 70% p-mRCA. b.) 4v CABG 09/16/2019.   CKD (chronic kidney disease), stage III (HCC)    Diabetes mellitus without complication (HCC)    Diverticulosis    GERD (gastroesophageal reflux disease)    History of kidney stones    HTN (hypertension)    Hyperlipidemia    Long term current use of antithrombotics/antiplatelets    a.) DAPT therapies (ASA + clopidogrel )   NSTEMI (non-ST elevated myocardial infarction) (HCC) 09/11/2019   a.) LHC 09/12/2019: 85% m-dLM, 50% pLCx, 85% p-m LAD, 70% pLAD, 45% mLAD, 70% p-mRCA; transferred to [facility] for CVTS consult. Underwent 4v CABG on 09/16/2019.   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   a.) 4v CABG: LIMA-LAD,  SVG-RCA, SVG-OM1, SVG-D1   Small bowel obstruction (HCC)    after surgeries   Thoracic ascending aortic aneurysm    a.) measured 4.1 cm by CT on 09/14/2019   Past Surgical History:  Procedure Laterality Date   aorto-bifem  2003   [facility]    APPENDECTOMY     CAROTID STENT     [facility]   CATARACT EXTRACTION, BILATERAL  2017   CHOLECYSTECTOMY  2002   COLONOSCOPY     COLONOSCOPY WITH PROPOFOL  N/A 07/17/2021   Procedure: COLONOSCOPY WITH PROPOFOL ;  Surgeon: Therisa Bi, MD;  Location: [facility] ENDOSCOPY;  Service: Gastroenterology;  Laterality: N/A;   CORONARY ARTERY BYPASS GRAFT N/A 09/16/2019   Procedure: CORONARY ARTERY BYPASS GRAFTING (CABG) using LIMA to LAD; Endoscopic right saphenous vein harvest to OM1, Diag1, and RCA.;  Surgeon: German Bartlett PEDLAR, MD;  Location: [facility] OR;  Service: Open Heart Surgery;  Laterality: N/A;   CYSTOSCOPY WITH BIOPSY N/A 10/08/2021   Procedure: CYSTOSCOPY WITH BLADDER BIOPSY;  Surgeon: Twylla Glendia BROCKS, MD;  Location: [facility] ORS;  Service: Urology;  Laterality: N/A;   ENDARTERECTOMY FEMORAL Left 03/13/2021   Procedure: ENDARTERECTOMY FEMORAL (GROIN REVISION);  Surgeon: Jama Cordella MATSU, MD;  Location: [facility] ORS;  Service: Vascular;  Laterality: Left;   EYE SURGERY  Cataracts removed ou   LEFT HEART CATH AND CORONARY ANGIOGRAPHY  N/A 09/12/2019   Procedure: LEFT HEART CATH AND CORONARY ANGIOGRAPHY;  Surgeon: Hester Wolm PARAS, MD;  Location: [facility] INVASIVE CV LAB;  Service: Cardiovascular;  Laterality: N/A;   LITHOTRIPSY     LOWER EXTREMITY ANGIOGRAPHY Left 01/15/2021   Procedure: LOWER EXTREMITY ANGIOGRAPHY;  Surgeon: Jama Cordella MATSU, MD;  Location: [facility] INVASIVE CV LAB;  Service: Cardiovascular;  Laterality: Left;   SPINE SURGERY  1954   TEE WITHOUT CARDIOVERSION N/A 09/16/2019   Procedure: TRANSESOPHAGEAL ECHOCARDIOGRAM (TEE);  Surgeon: German Bartlett PEDLAR, MD;  Location: [facility] OR;  Service: Open Heart Surgery;  Laterality: N/A;   TONSILLECTOMY     TRANSURETHRAL  RESECTION OF BLADDER TUMOR  10/08/2021   Procedure: TRANSURETHRAL RESECTION OF BLADDER TUMOR (TURBT);  Surgeon: Twylla Glendia BROCKS, MD;  Location: [facility] ORS;  Service: Urology;;   TRANSURETHRAL RESECTION OF BLADDER TUMOR WITH MITOMYCIN -C N/A 07/24/2020   Procedure: TRANSURETHRAL RESECTION OF BLADDER TUMOR WITH Gemcitabine ;  Surgeon: Twylla Glendia BROCKS, MD;  Location: [facility] ORS;  Service: Urology;  Laterality: N/A;   VASECTOMY  1976   WISDOM TOOTH EXTRACTION     Patient Active Problem List   Diagnosis Date Noted   Anemia, chronic renal failure 11/02/2024   Type 2 diabetes mellitus with diabetic nephropathy, without long-term current use of insulin  (HCC) 09/13/2024   Lumbar radiculopathy 04/07/2024   Bladder cancer (HCC) 05/22/2021   Anemia, chronic disease 05/22/2021   Aortic atherosclerosis 05/17/2020   BPH (benign prostatic hyperplasia) 11/16/2019   S/P CABG x 4 11/04/2019   Atrial flutter (HCC) 11/04/2019   Allergic rhinitis 09/12/2019   Coronary artery disease due to lipid rich plaque    Stage 3b chronic kidney disease (HCC) 09/14/2018   BCC (basal cell carcinoma), face 04/12/2018   Actinic keratosis 08/14/2015   Advanced directives, counseling/discussion 07/25/2014   Hyperlipemia 03/02/2009   Essential hypertension, benign 09/22/2007   PAD (peripheral artery disease) 09/22/2007   Osteoarthritis, generalized 09/22/2007    PCP: Bennett Reuben POUR, MD  REFERRING PROVIDER: Carilyn [name] BRAVO, MD   REFERRING DIAG: [phone] (ICD-10-CM) - Lumbosacral spondylosis without myelopathy   Rationale for Evaluation and Treatment: Rehabilitation  THERAPY DIAG:  Difficulty in walking, not elsewhere classified  Chronic right-sided low back pain without sciatica  ONSET DATE:   SUBJECTIVE:                                                                                                                                                                                           SUBJECTIVE  STATEMENT: Pt doing generally well. States that it has been a so-so kind of day. No significant pain, but some stiffness.  PERTINENT HISTORY:  89yoM referred to OPPT for acute on chronic exacerbation of Rt low back pain. Pt has pain in the right hip near the Rt posterolateral belt loop. Pt denies any migration of symptoms, although they fluctuate in intensity, certainly worse when standing or walking. Pt reports pain as achy. Pt reports >5 years history without any specific inciting event. Pt has chronic AMB limitations due to claudication of the legs. Pt AMB without device at baseline, reports no falls, no balance issues. Recent FU with vascular surgery in December shows no new blockage issues.   PAIN:  Are you having pain? ~1/10 today   PRECAUTIONS: None  WEIGHT BEARING RESTRICTIONS: No  FALLS:  Has patient fallen in last 6 months?   OCCUPATION: Retired teacher, early years/pre   PLOF: Independent   PATIENT GOALS: Have less pain   OBJECTIVE:  Note: Objective measures were completed at Evaluation unless otherwise noted.  DIAGNOSTIC FINDINGS:  None  PATIENT SURVEYS:  None  HIP ROM:   ROM Assessment Hip 09/26/24     Right  Left   Flexion Supine 120 125  External rotation (90/90) 42 45  Internal Rotation (90/90) 22 18  Supine hip ABDCT  25 38     PALPATION:  Pt has sharp pain upon palpations of gluteus medius in 3 anatomical regions near the iliac crest, and Rt anterior gluteus minimus.   GAIT: Distance walked: 197ft Assistive device utilized: none Level of assistance: Modified independent  Comments: Pain progression begins after 168ft   TREATMENT DATE 11/02/2024:                                                                                                                             Nustep BLE/BUE reciprocal movement and endurance training level 1-3 x 6 min  *seated rest   Performed 2 bouts  -forward stepping in // bars 3x9ft bilat 9.5lb cable resistance  -side stepping in  // bars 3x24ft bilat 9.5lb cable resistance  -seated Hip adduction squeeze x 12  -seated Hip abduction/clam shell x 12 RTB -seated Hip IR for reverse clam shel with ball between knees x 10  *seated rest between bouts.   Sit<>stand without UE support x 4 then with UE on seat x 6    Pt reports decreased tension in the Hip on this day so STM foregone on this day.   PATIENT EDUCATION:  Education details: Relayed response to session here with findings from physiatry note this week.  Person educated: Patient Education method: Chief Technology Officer Education comprehension: Good   HOME EXERCISE PROGRAM: Access Code: [email or web address] URL: [email or web address] Date: 10/28/2024 Prepared by: Massie Dollar  Exercises - Supine Gluteal Sets  - 3 x weekly - 3 sets - 10 reps - Supine Bridge  - 3 x weekly - 3 sets - 10 reps - Supine Bridge with Mini Swiss Ball Between Knees  - 3 x weekly - 3 sets - 10 reps - Clamshell  - 1 x  daily - 3 x weekly - 3 sets - 10 reps - Sidelying Hip Abduction  - 1 x daily - 3 x weekly - 3 sets - 10 reps - Sit to Stand with Arms Crossed  - 1 x daily - 3 x weekly - 3 sets - 7 reps  ASSESSMENT:  CLINICAL IMPRESSION: Continued to focused treatment on increased gluteal loading and activation on this day.  Pt reports decreased tension in the Hip on this day so STM again no completed on this day.  Tolerated all increased loading well with increased weight on resisted gait and increased global hip strengthening. May be ready for d/c after progress note assessment at next session. But will continue to assess need for skilled PT, versus return to community fitness center for strengthening and general conditioning. At this time, patient will benefit from skilled physical therapy intervention to reduce deficits and impairments identified in evaluation, in order to reduce pain, improve quality of life, and maximize activity tolerance for ADL, IADL, and leisure/fitness.  Physical therapy will help pt achieve long and short term goals of care.   OBJECTIVE IMPAIRMENTS: Decreased knowledge of condition, decreased use of DME, decreased mobility, difficulty walking, decreased strength, decreased ROM. ACTIVITY LIMITATIONS: Lifting, standing, walking, squatting, transfers, locomotion level PARTICIPATION LIMITATIONS: Cleaning, laundry, interpersonal relationships, driving, yardwork, community activity.  PERSONAL FACTORS: Age, behavior pattern, education, past/current experiences, transportation, profession  are also affecting patient's functional outcome.  REHAB POTENTIAL: Good CLINICAL DECISION MAKING: Medium  EVALUATION COMPLEXITY: Moderate    GOALS: Goals reviewed with patient? No  SHORT TERM GOALS: Target date: 10/19/24  Pt to show >15% improvement on LEFS.  Baseline:  Goal status: ON GOING  2.  Pt to demonstrate ability to AMB >342ft without increase in pain symptoms.  Baseline:  Goal status: INITIAL  3.  Pt reports compliance with home and/or gym program for BLE strengthening and ROM.  Baseline: wants to joint Well-zone with silver sneakers  Goal status: MET, with transition to gym in process   LONG TERM GOALS: Target date: 11/07/24  Pt to report no increase in Rt hip pain >3/10 over most recent 7 days.  Baseline: 10/19/24: at worst between 3-4/10 in past week.  Goal status: Partiall met  2.  Pt to report confidence in DC level HEP for continued hip strength and tissue maintenance.  Baseline:  Goal status: INITIAL   PLAN:  PT FREQUENCY: 1-2x/week  PT DURATION: 6 weeks  PLANNED INTERVENTIONS: 97110-Therapeutic exercises, 97530- Therapeutic activity, [id number]- Neuromuscular re-education, 97535- Self Care, 02859- Manual therapy, [id number]- Gait training, [phone]- Orthotic Initial, [phone]- Orthotic/Prosthetic subsequent, H9716- Electrical stimulation (unattended), [phone]- Electrical stimulation (manual), [id number]- Traction (mechanical), [id number]- Ionotophoresis  4mg /ml Dexamethasone , Patient/Family education, Balance training, Stair training, Spinal manipulation, Spinal mobilization, Cognitive remediation, DME instructions, Cryotherapy, and Moist heat.  PLAN FOR NEXT SESSION:   Progress note, consider d/c if goals met.      Massie Dollar PT, DPT  Physical Therapist - [facility]  2:08 PM 11/02/2024   "

## 2024-11-04 ENCOUNTER — Inpatient Hospital Stay: Attending: Oncology

## 2024-11-04 ENCOUNTER — Inpatient Hospital Stay

## 2024-11-04 VITALS — BP 139/67 | HR 80

## 2024-11-04 DIAGNOSIS — N1832 Chronic kidney disease, stage 3b: Secondary | ICD-10-CM | POA: Insufficient documentation

## 2024-11-04 DIAGNOSIS — D649 Anemia, unspecified: Secondary | ICD-10-CM

## 2024-11-04 DIAGNOSIS — D631 Anemia in chronic kidney disease: Secondary | ICD-10-CM | POA: Diagnosis present

## 2024-11-04 DIAGNOSIS — I129 Hypertensive chronic kidney disease with stage 1 through stage 4 chronic kidney disease, or unspecified chronic kidney disease: Secondary | ICD-10-CM | POA: Insufficient documentation

## 2024-11-04 LAB — CBC WITH DIFFERENTIAL/PLATELET
Abs Immature Granulocytes: 0.05 K/uL (ref 0.00–0.07)
Basophils Absolute: 0 K/uL (ref 0.0–0.1)
Basophils Relative: 0 %
Eosinophils Absolute: 0.1 K/uL (ref 0.0–0.5)
Eosinophils Relative: 1 %
HCT: 27.7 % — ABNORMAL LOW (ref 39.0–52.0)
Hemoglobin: 8.6 g/dL — ABNORMAL LOW (ref 13.0–17.0)
Immature Granulocytes: 1 %
Lymphocytes Relative: 26 %
Lymphs Abs: 2.3 K/uL (ref 0.7–4.0)
MCH: 39.6 pg — ABNORMAL HIGH (ref 26.0–34.0)
MCHC: 31 g/dL (ref 30.0–36.0)
MCV: 127.6 fL — ABNORMAL HIGH (ref 80.0–100.0)
Monocytes Absolute: 0.9 K/uL (ref 0.1–1.0)
Monocytes Relative: 11 %
Neutro Abs: 5.4 K/uL (ref 1.7–7.7)
Neutrophils Relative %: 61 %
Platelets: 170 K/uL (ref 150–400)
RBC: 2.17 MIL/uL — ABNORMAL LOW (ref 4.22–5.81)
RDW: 16.9 % — ABNORMAL HIGH (ref 11.5–15.5)
WBC: 8.7 K/uL (ref 4.0–10.5)
nRBC: 0 % (ref 0.0–0.2)

## 2024-11-04 LAB — IRON AND TIBC
Iron: 57 ug/dL (ref 45–182)
Saturation Ratios: 17 % — ABNORMAL LOW (ref 17.9–39.5)
TIBC: 326 ug/dL (ref 250–450)
UIBC: 270 ug/dL

## 2024-11-04 LAB — FERRITIN: Ferritin: 58 ng/mL (ref 24–336)

## 2024-11-04 MED ORDER — EPOETIN ALFA-EPBX 40000 UNIT/ML IJ SOLN
40000.0000 [IU] | Freq: Once | INTRAMUSCULAR | Status: AC
  Administered 2024-11-04: 40000 [IU] via SUBCUTANEOUS
  Filled 2024-11-04: qty 1

## 2024-11-04 NOTE — Progress Notes (Signed)
 Patient does not have appts scheduled until 02/2025.  MD request patient be scheduled for lab/MD/Retacrit .  Schedule message sent.

## 2024-11-07 ENCOUNTER — Ambulatory Visit

## 2024-11-07 DIAGNOSIS — R262 Difficulty in walking, not elsewhere classified: Secondary | ICD-10-CM

## 2024-11-07 DIAGNOSIS — M545 Low back pain, unspecified: Secondary | ICD-10-CM

## 2024-11-07 NOTE — Therapy (Signed)
 " OUTPATIENT PHYSICAL THERAPY TREATMENT Physical Therapy Progress Note   Dates of reporting period  09/26/2024   to   11/07/2024  Patient Name: Brandon Lewis MRN: [id number] DOB:[birth year], 89 y.o., male Today's Date: 11/07/2024  END OF SESSION:  PT End of Session - 11/07/24 1454     Visit Number 10    Number of Visits 12    Date for Recertification  11/07/24    Authorization Type [employer]; through 11/07/24    Authorization Time Period 12 visits between 12/8-12/25/24    Progress Note Due on Visit 10    PT Start Time 1449    PT Stop Time 1529    PT Time Calculation (min) 40 min    Activity Tolerance Patient tolerated treatment well;No increased pain    Behavior During Therapy WFL for tasks assessed/performed           Past Medical History:  Diagnosis Date   Allergy side effects- NKDA   Anemia    Aortic atherosclerosis    Atrial flutter, paroxysmal (HCC)    a.) postoperatively following CABG   Bladder cancer (HCC) 04/2020   BPH (benign prostatic hyperplasia)    CAD (coronary artery disease) 09/12/2019   a.) LHC 09/12/2019: EF 55-60%; 85% m-dLM, 50% pLCx, 85% p-m LAD, 70% pLAD, 45% mLAD, 70% p-mRCA. b.) 4v CABG 09/16/2019.   CKD (chronic kidney disease), stage III (HCC)    Diabetes mellitus without complication (HCC)    Diverticulosis    GERD (gastroesophageal reflux disease)    History of kidney stones    HTN (hypertension)    Hyperlipidemia    Long term current use of antithrombotics/antiplatelets    a.) DAPT therapies (ASA + clopidogrel )   NSTEMI (non-ST elevated myocardial infarction) (HCC) 09/11/2019   a.) LHC 09/12/2019: 85% m-dLM, 50% pLCx, 85% p-m LAD, 70% pLAD, 45% mLAD, 70% p-mRCA; transferred to [facility] for CVTS consult. Underwent 4v CABG on 09/16/2019.   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   a.) 4v CABG: LIMA-LAD, SVG-RCA, SVG-OM1, SVG-D1   Small bowel obstruction (HCC)    after surgeries   Thoracic ascending aortic aneurysm    a.) measured 4.1 cm by CT on 09/14/2019   Past Surgical History:  Procedure Laterality Date   aorto-bifem  2003   [facility]    APPENDECTOMY     CAROTID STENT     [facility]   CATARACT EXTRACTION, BILATERAL  2017   CHOLECYSTECTOMY  2002   COLONOSCOPY     COLONOSCOPY WITH PROPOFOL  N/A 07/17/2021   Procedure: COLONOSCOPY WITH PROPOFOL ;  Surgeon: Therisa Bi, MD;  Location: [facility];  Service: Gastroenterology;  Laterality: N/A;   CORONARY ARTERY BYPASS GRAFT N/A 09/16/2019   Procedure: CORONARY ARTERY BYPASS GRAFTING (CABG) using LIMA to LAD; Endoscopic right saphenous vein harvest to OM1, Diag1, and RCA.;  Surgeon: German Bartlett PEDLAR, MD;  Location: [facility] OR;  Service: Open Heart Surgery;  Laterality: N/A;   CYSTOSCOPY WITH BIOPSY N/A 10/08/2021   Procedure: CYSTOSCOPY WITH BLADDER BIOPSY;  Surgeon: Twylla Glendia BROCKS, MD;  Location: [facility] ORS;  Service: Urology;  Laterality: N/A;   ENDARTERECTOMY FEMORAL Left 03/13/2021   Procedure: ENDARTERECTOMY FEMORAL (GROIN REVISION);  Surgeon: Jama Cordella MATSU, MD;  Location: [facility] ORS;  Service: Vascular;  Laterality: Left;   EYE SURGERY  Cataracts removed ou   LEFT HEART CATH AND CORONARY ANGIOGRAPHY N/A 09/12/2019   Procedure: LEFT HEART CATH AND CORONARY ANGIOGRAPHY;  Surgeon: Hester Wolm PARAS, MD;  Location: [facility] INVASIVE CV LAB;  Service: Cardiovascular;  Laterality: N/A;   LITHOTRIPSY     LOWER EXTREMITY ANGIOGRAPHY Left 01/15/2021   Procedure: LOWER EXTREMITY ANGIOGRAPHY;  Surgeon: Jama Cordella MATSU, MD;  Location: [facility] INVASIVE CV LAB;  Service: Cardiovascular;  Laterality: Left;   SPINE SURGERY  1954   TEE WITHOUT CARDIOVERSION N/A 09/16/2019   Procedure: TRANSESOPHAGEAL ECHOCARDIOGRAM (TEE);  Surgeon: German Bartlett PEDLAR, MD;   Location: [facility];  Service: Open Heart Surgery;  Laterality: N/A;   TONSILLECTOMY     TRANSURETHRAL RESECTION OF BLADDER TUMOR  10/08/2021   Procedure: TRANSURETHRAL RESECTION OF BLADDER TUMOR (TURBT);  Surgeon: Twylla Glendia BROCKS, MD;  Location: [facility] ORS;  Service: Urology;;   TRANSURETHRAL RESECTION OF BLADDER TUMOR WITH MITOMYCIN -C N/A 07/24/2020   Procedure: TRANSURETHRAL RESECTION OF BLADDER TUMOR WITH Gemcitabine ;  Surgeon: Twylla Glendia BROCKS, MD;  Location: [facility] ORS;  Service: Urology;  Laterality: N/A;   VASECTOMY  1976   WISDOM TOOTH EXTRACTION     Patient Active Problem List   Diagnosis Date Noted   Anemia, chronic renal failure 11/02/2024   Type 2 diabetes mellitus with diabetic nephropathy, without long-term current use of insulin  (HCC) 09/13/2024   Lumbar radiculopathy 04/07/2024   Bladder cancer (HCC) 05/22/2021   Anemia, chronic disease 05/22/2021   Aortic atherosclerosis 05/17/2020   BPH (benign prostatic hyperplasia) 11/16/2019   S/P CABG x 4 11/04/2019   Atrial flutter (HCC) 11/04/2019   Allergic rhinitis 09/12/2019   Coronary artery disease due to lipid rich plaque    Stage 3b chronic kidney disease (HCC) 09/14/2018   BCC (basal cell carcinoma), face 04/12/2018   Actinic keratosis 08/14/2015   Advanced directives, counseling/discussion 07/25/2014   Hyperlipemia 03/02/2009   Essential hypertension, benign 09/22/2007   PAD (peripheral artery disease) 09/22/2007   Osteoarthritis, generalized 09/22/2007    PCP: Bennett Reuben POUR, MD  REFERRING PROVIDER: Carilyn [name] BRAVO, MD   REFERRING DIAG: [phone] (ICD-10-CM) - Lumbosacral spondylosis without myelopathy   Rationale for Evaluation and Treatment: Rehabilitation  THERAPY DIAG:  Difficulty in walking, not elsewhere classified  Chronic right-sided low back pain without sciatica  ONSET DATE:   SUBJECTIVE:                                                                                                                                                                                            SUBJECTIVE STATEMENT: Patient reports doing well  today with no significant changes.   PERTINENT HISTORY:  89yoM referred to OPPT for acute on chronic exacerbation of Rt low back pain. Pt has pain in the right hip near the Rt posterolateral belt loop. Pt denies any migration of symptoms, although they fluctuate in intensity, certainly worse when standing or walking. Pt reports pain as achy. Pt reports >5 years history without any specific inciting event. Pt has chronic AMB limitations due to claudication of the legs. Pt AMB without device at baseline, reports no falls, no balance issues. Recent FU with vascular surgery in December shows no new blockage issues.   PAIN:  Are you having pain? ~1/10 today   PRECAUTIONS: None  WEIGHT BEARING RESTRICTIONS: No  FALLS:  Has patient fallen in last 6 months?   OCCUPATION: Retired teacher, early years/pre   PLOF: Independent   PATIENT GOALS: Have less pain   OBJECTIVE:  Note: Objective measures were completed at Evaluation unless otherwise noted.  DIAGNOSTIC FINDINGS:  None  PATIENT SURVEYS:  None  HIP ROM:   ROM Assessment Hip 09/26/24     Right  Left   Flexion Supine 120 125  External rotation (90/90) 42 45  Internal Rotation (90/90) 22 18  Supine hip ABDCT  25 38     PALPATION:  Pt has sharp pain upon palpations of gluteus medius in 3 anatomical regions near the iliac crest, and Rt anterior gluteus minimus.   GAIT: Distance walked: 170ft Assistive device utilized: none Level of assistance: Modified independent  Comments: Pain progression begins after 142ft   TREATMENT DATE 11/07/24:                                                                                                                             Nustep BLE/BUE reciprocal movement and endurance training level  x 6 min Level 3-6. *seated rest   Progress Note - See LTG and Assessment portions of this  note.    Josepha Catalina Visit/orientation near end of treatment session.      PATIENT EDUCATION:  Education details: Relayed response to session here with findings from physiatry note this week.  Person educated: Patient Education method: [employer] Technology [employer] Education comprehension: Good   HOME EXERCISE PROGRAM: Access Code: [email or web address] URL: [email or web address] Date: 10/28/2024 Prepared by: Massie Dollar  Exercises - Supine Gluteal Sets  - 3 x weekly - 3 sets - 10 reps - Supine Bridge  - 3 x weekly - 3 sets - 10 reps - Supine Bridge with Mini Swiss Ball Between Knees  - 3 x weekly - 3 sets - 10 reps - Clamshell  - 1 x daily - 3 x weekly - 3 sets - 10 reps - Sidelying Hip Abduction  - 1 x daily - 3 x weekly - 3 sets - 10 reps - Sit to Stand with Arms Crossed  - 1 x daily - 3 x weekly - 3 sets - 7 reps  ASSESSMENT:  CLINICAL IMPRESSION: Today was  patient's 10th visit and a progress note was taken. Subjectively, patient reports that he is no longer having the hip pain like he used to, but now it is just a low grade nagging pain in the R hip. He reports he can walk much better than before and can perform all activities better than before. Patient reports he is also enrolled in [employer] program at the [facility].  Objectively, patient able to ambulate 300' without AD, but does report very minimal increase in R hip pain. He reports the pain is tolerable now and he is still able to walk though he has pain because the pain level is so low. He is not confident in continuing HEP in [place] and wants to have orientation on the machines in the [facility] so that he can continue his exercises for progressive strengthening. He believes he could continue on his own if he had a couple sessions in the [facility] prior to discharge. I did go over to the [facility] with patient today to orient him to [employer] and [employer]. I also oriented him on safe/sturdy areas  with hand holds when performing certain exercises such as hip 3-way. Overall, patient reports decreased pain in the R hip, but is not yet fully confident in continuing HEP. He wishes to have orientation in [place] prior to discharge. Patient has 2 more authorized visits remaining. Patient will benefit continuing PT for further progression toward goals allowing him to continue exercises efficiently and safely once discharged form PT services.      OBJECTIVE IMPAIRMENTS: Decreased knowledge of condition, decreased use of DME, decreased mobility, difficulty walking, decreased strength, decreased ROM. ACTIVITY LIMITATIONS: Lifting, standing, walking, squatting, transfers, locomotion level PARTICIPATION LIMITATIONS: Cleaning, laundry, interpersonal relationships, driving, yardwork, community activity.  PERSONAL FACTORS: Age, behavior pattern, education, past/current experiences, transportation, profession  are also affecting patient's functional outcome.  REHAB POTENTIAL: Good CLINICAL DECISION MAKING: Medium  EVALUATION COMPLEXITY: Moderate    GOALS: Goals reviewed with patient? No  SHORT TERM GOALS: Target date: 10/19/24  Pt to show >15% improvement on LEFS.  Baseline:  Goal status: ON GOING  2.  Pt to demonstrate ability to AMB >341ft without increase in pain symptoms.  Baseline:  11/07/2024: Pain goes up a little, but no longer intolerable. Goal status: Ongoing.  3.  Pt reports compliance with home and/or gym program for BLE strengthening and ROM.  Baseline: wants to joint [facility] with silver sneakers  Goal status: MET, with transition to gym in process   LONG TERM GOALS: Target date: 11/07/24  Pt to report no increase in Rt hip pain >3/10 over most recent 7 days.  Baseline: 10/19/24: at worst between 3-4/10 in past week.  11/07/2024: 2/10 Goal status: MET  2.  Pt to report confidence in DC level HEP for continued hip strength and tissue maintenance.  Baseline:  11/07/2024:  Not confident with machines in [place]. Wants orientation.  Goal status: ONGOING.    PLAN:  PT FREQUENCY: 1-2x/week  PT DURATION: 6 weeks  PLANNED INTERVENTIONS: 97110-Therapeutic exercises, 97530- Therapeutic activity, [id number]- Neuromuscular re-education, 97535- Self Care, [id number]- Manual therapy, [id number]- Gait training, [phone]- Orthotic Initial, [phone]- Orthotic/Prosthetic subsequent, [id number]- Electrical stimulation (unattended), [phone]- Electrical stimulation (manual), [id number]- Traction (mechanical), [id number]- Ionotophoresis 4mg /ml Dexamethasone , Patient/Family education, Balance training, Stair training, Spinal manipulation, Spinal mobilization, Cognitive remediation, DME instructions, Cryotherapy, and Moist heat.  PLAN FOR NEXT SESSION:   - [facility] visit/orientation then discharge after 12th visit if still feeling comfortable continuing on his own.  Norman Sharps, PT, DPT Physical Therapist - [facility]  4:34 PM 11/07/24   "

## 2024-11-09 ENCOUNTER — Ambulatory Visit

## 2024-11-09 ENCOUNTER — Ambulatory Visit: Admitting: Physical Therapy

## 2024-11-09 DIAGNOSIS — R262 Difficulty in walking, not elsewhere classified: Secondary | ICD-10-CM

## 2024-11-09 DIAGNOSIS — G8929 Other chronic pain: Secondary | ICD-10-CM

## 2024-11-09 NOTE — Therapy (Signed)
 " OUTPATIENT PHYSICAL THERAPY TREATMENT Physical Therapy Progress Note   Dates of reporting period  09/26/2024   to   11/07/2024  Patient Name: Brandon Lewis MRN: [id number] DOB:Jan 12, 1934, 89 y.o., male Today's Date: 11/09/2024  END OF SESSION:  PT End of Session - 11/09/24 1309     Visit Number 11    Number of Visits 12    Date for Recertification  11/07/24    Authorization Type [employer]; through 11/07/24    Authorization Time Period 12 visits between 12/8-12/25/24    Progress Note Due on Visit 10    PT Start Time 1314    PT Stop Time 1400    PT Time Calculation (min) 46 min    Activity Tolerance Patient tolerated treatment well;No increased pain    Behavior During Therapy WFL for tasks assessed/performed           Past Medical History:  Diagnosis Date   Allergy side effects- NKDA   Anemia    Aortic atherosclerosis    Atrial flutter, paroxysmal (HCC)    a.) postoperatively following CABG   Bladder cancer (HCC) 04/2020   BPH (benign prostatic hyperplasia)    CAD (coronary artery disease) 09/12/2019   a.) LHC 09/12/2019: EF 55-60%; 85% m-dLM, 50% pLCx, 85% p-m LAD, 70% pLAD, 45% mLAD, 70% p-mRCA. b.) 4v CABG 09/16/2019.   CKD (chronic kidney disease), stage III (HCC)    Diabetes mellitus without complication (HCC)    Diverticulosis    GERD (gastroesophageal reflux disease)    History of kidney stones    HTN (hypertension)    Hyperlipidemia    Long term current use of antithrombotics/antiplatelets    a.) DAPT therapies (ASA + clopidogrel )   NSTEMI (non-ST elevated myocardial infarction) (HCC) 09/11/2019   a.) LHC 09/12/2019: 85% m-dLM, 50% pLCx, 85% p-m LAD, 70% pLAD, 45% mLAD, 70% p-mRCA; transferred to [facility] for CVTS consult. Underwent 4v CABG on 09/16/2019.   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   a.) 4v CABG: LIMA-LAD, SVG-RCA, SVG-OM1, SVG-D1   Small bowel obstruction (HCC)    after surgeries   Thoracic ascending aortic aneurysm    a.) measured 4.1 cm by CT on 09/14/2019   Past Surgical History:  Procedure Laterality Date   aorto-bifem  2003   [facility]    APPENDECTOMY     CAROTID STENT     [facility]   CATARACT EXTRACTION, BILATERAL  2017   CHOLECYSTECTOMY  2002   COLONOSCOPY     COLONOSCOPY WITH PROPOFOL  N/A 07/17/2021   Procedure: COLONOSCOPY WITH PROPOFOL ;  Surgeon: Therisa Bi, MD;  Location: [facility] ENDOSCOPY;  Service: Gastroenterology;  Laterality: N/A;   CORONARY ARTERY BYPASS GRAFT N/A 09/16/2019   Procedure: CORONARY ARTERY BYPASS GRAFTING (CABG) using LIMA to LAD; Endoscopic right saphenous vein harvest to OM1, Diag1, and RCA.;  Surgeon: German Bartlett PEDLAR, MD;  Location: [facility] OR;  Service: Open Heart Surgery;  Laterality: N/A;   CYSTOSCOPY WITH BIOPSY N/A 10/08/2021   Procedure: CYSTOSCOPY WITH BLADDER BIOPSY;  Surgeon: Twylla Glendia BROCKS, MD;  Location: [facility] ORS;  Service: Urology;  Laterality: N/A;   ENDARTERECTOMY FEMORAL Left 03/13/2021   Procedure: ENDARTERECTOMY FEMORAL (GROIN REVISION);  Surgeon: Jama Cordella MATSU, MD;  Location: [facility] ORS;  Service: Vascular;  Laterality: Left;   EYE SURGERY  Cataracts removed ou   LEFT HEART CATH AND CORONARY ANGIOGRAPHY N/A 09/12/2019   Procedure: LEFT HEART CATH AND CORONARY ANGIOGRAPHY;  Surgeon: Hester Wolm PARAS, MD;  Location: [facility] INVASIVE CV LAB;  Service: Cardiovascular;  Laterality: N/A;   LITHOTRIPSY     LOWER EXTREMITY ANGIOGRAPHY Left 01/15/2021   Procedure: LOWER EXTREMITY ANGIOGRAPHY;  Surgeon: Jama Cordella MATSU, MD;  Location: [facility] INVASIVE CV LAB;  Service: Cardiovascular;  Laterality: Left;   SPINE SURGERY  1954   TEE WITHOUT CARDIOVERSION N/A 09/16/2019   Procedure: TRANSESOPHAGEAL ECHOCARDIOGRAM (TEE);  Surgeon: German Bartlett PEDLAR, MD;   Location: [facility] OR;  Service: Open Heart Surgery;  Laterality: N/A;   TONSILLECTOMY     TRANSURETHRAL RESECTION OF BLADDER TUMOR  10/08/2021   Procedure: TRANSURETHRAL RESECTION OF BLADDER TUMOR (TURBT);  Surgeon: Twylla Glendia BROCKS, MD;  Location: [facility] ORS;  Service: Urology;;   TRANSURETHRAL RESECTION OF BLADDER TUMOR WITH MITOMYCIN -C N/A 07/24/2020   Procedure: TRANSURETHRAL RESECTION OF BLADDER TUMOR WITH Gemcitabine ;  Surgeon: Twylla Glendia BROCKS, MD;  Location: [facility] ORS;  Service: Urology;  Laterality: N/A;   VASECTOMY  1976   WISDOM TOOTH EXTRACTION     Patient Active Problem List   Diagnosis Date Noted   Anemia, chronic renal failure 11/02/2024   Type 2 diabetes mellitus with diabetic nephropathy, without long-term current use of insulin  (HCC) 09/13/2024   Lumbar radiculopathy 04/07/2024   Bladder cancer (HCC) 05/22/2021   Anemia, chronic disease 05/22/2021   Aortic atherosclerosis 05/17/2020   BPH (benign prostatic hyperplasia) 11/16/2019   S/P CABG x 4 11/04/2019   Atrial flutter (HCC) 11/04/2019   Allergic rhinitis 09/12/2019   Coronary artery disease due to lipid rich plaque    Stage 3b chronic kidney disease (HCC) 09/14/2018   BCC (basal cell carcinoma), face 04/12/2018   Actinic keratosis 08/14/2015   Advanced directives, counseling/discussion 07/25/2014   Hyperlipemia 03/02/2009   Essential hypertension, benign 09/22/2007   PAD (peripheral artery disease) 09/22/2007   Osteoarthritis, generalized 09/22/2007    PCP: Bennett Reuben POUR, MD  REFERRING PROVIDER: Carilyn [name] BRAVO, MD   REFERRING DIAG: [phone] (ICD-10-CM) - Lumbosacral spondylosis without myelopathy   Rationale for Evaluation and Treatment: Rehabilitation  THERAPY DIAG:  Difficulty in walking, not elsewhere classified  Chronic right-sided low back pain without sciatica  ONSET DATE:   SUBJECTIVE:                                                                                                                                                                                            SUBJECTIVE STATEMENT: Patient reports doing well  today with no significant changes.   PERTINENT HISTORY:  89yoM referred to OPPT for acute on chronic exacerbation of Rt low back pain. Pt has pain in the right hip near the Rt posterolateral belt loop. Pt denies any migration of symptoms, although they fluctuate in intensity, certainly worse when standing or walking. Pt reports pain as achy. Pt reports >5 years history without any specific inciting event. Pt has chronic AMB limitations due to claudication of the legs. Pt AMB without device at baseline, reports no falls, no balance issues. Recent FU with vascular surgery in December shows no new blockage issues.   PAIN:  Are you having pain? ~1/10 today   PRECAUTIONS: None  WEIGHT BEARING RESTRICTIONS: No  FALLS:  Has patient fallen in last 6 months?   OCCUPATION: Retired teacher, early years/pre   PLOF: Independent   PATIENT GOALS: Have less pain   OBJECTIVE:  Note: Objective measures were completed at Evaluation unless otherwise noted.  DIAGNOSTIC FINDINGS:  None  PATIENT SURVEYS:  None  HIP ROM:   ROM Assessment Hip 09/26/24     Right  Left   Flexion Supine 120 125  External rotation (90/90) 42 45  Internal Rotation (90/90) 22 18  Supine hip ABDCT  25 38     PALPATION:  Pt has sharp pain upon palpations of gluteus medius in 3 anatomical regions near the iliac crest, and Rt anterior gluteus minimus.   GAIT: Distance walked: 165ft Assistive device utilized: none Level of assistance: Modified independent  Comments: Pain progression begins after 149ft   TREATMENT DATE 11/09/24:                                                                                                                             Nustep BLE/BUE: hills setting at levels 3-6 x7 minutes.    WELLZONE VISIT/ORIENTATION along with HEP UPDATE:   -Hip 3-way: RTB 1x10 each direction.    -Knee Extension: Level 1: 3x10   -Hamstring Curls: Level 3 3x10  -Leg press machine: Level 4 3x10  -HEP updated and handout given to patient.    PATIENT EDUCATION:  Education details: Relayed response to session here with findings from physiatry note this week.  Person educated: Patient Education method: Chief Technology Officer Education comprehension: Good   HOME EXERCISE PROGRAM: Access Code: [email or web address] URL: [email or web address] Date: 10/28/2024 Prepared by: Massie Dollar  Exercises - Supine Gluteal Sets  - 3 x weekly - 3 sets - 10 reps - Supine Bridge  - 3 x weekly - 3 sets - 10 reps - Supine Bridge with Mini Swiss Ball Between Knees  - 3 x weekly - 3 sets - 10 reps - Clamshell  - 1 x daily - 3 x weekly - 3 sets - 10 reps - Sidelying Hip Abduction  - 1 x daily - 3 x weekly - 3 sets - 10 reps - Sit to Stand with Arms Crossed  - 1 x daily - 3 x weekly -  3 sets - 7 reps  ASSESSMENT:  CLINICAL IMPRESSION: -Today's treatment focused on transition to discharge/independent use of WellZone. He was educated on various compenents of the resistive machines along with weight and seat settings that were ideal for him specifically. He also worked to improve LE strength. I also discussed with him theraband exercises and exercise prescription in details as far as amount per week. He continues to be highly motivated and performed well though still was noted to have LE weakness with exercises. He has one more visit left where I will return to [facility] with him to ensure he can work equipment properly following discharge.      OBJECTIVE IMPAIRMENTS: Decreased knowledge of condition, decreased use of DME, decreased mobility, difficulty walking, decreased strength, decreased ROM. ACTIVITY LIMITATIONS: Lifting, standing, walking, squatting, transfers, locomotion level PARTICIPATION LIMITATIONS: Cleaning, laundry, interpersonal relationships, driving, yardwork, community activity.   PERSONAL FACTORS: Age, behavior pattern, education, past/current experiences, transportation, profession  are also affecting patient's functional outcome.  REHAB POTENTIAL: Good CLINICAL DECISION MAKING: Medium  EVALUATION COMPLEXITY: Moderate    GOALS: Goals reviewed with patient? No  SHORT TERM GOALS: Target date: 10/19/24  Pt to show >15% improvement on LEFS.  Baseline:  Goal status: ON GOING  2.  Pt to demonstrate ability to AMB >355ft without increase in pain symptoms.  Baseline:  11/07/2024: Pain goes up a little, but no longer intolerable. Goal status: Ongoing.  3.  Pt reports compliance with home and/or gym program for BLE strengthening and ROM.  Baseline: wants to joint Well-zone with silver sneakers  Goal status: MET, with transition to gym in process   LONG TERM GOALS: Target date: 11/07/24  Pt to report no increase in Rt hip pain >3/10 over most recent 7 days.  Baseline: 10/19/24: at worst between 3-4/10 in past week.  11/07/2024: 2/10 Goal status: MET  2.  Pt to report confidence in DC level HEP for continued hip strength and tissue maintenance.  Baseline:  11/07/2024: Not confident with machines in [place]. Wants orientation.  Goal status: ONGOING.    PLAN:  PT FREQUENCY: 1-2x/week  PT DURATION: 6 weeks  PLANNED INTERVENTIONS: 97110-Therapeutic exercises, 97530- Therapeutic activity, [id number]- Neuromuscular re-education, 97535- Self Care, [id number]- Manual therapy, [phone]- Gait training, [phone]- Orthotic Initial, [phone]- Orthotic/Prosthetic subsequent, H9716- Electrical stimulation (unattended), [phone]- Electrical stimulation (manual), [id number]- Traction (mechanical), [id number]- Ionotophoresis 4mg /ml Dexamethasone , Patient/Family education, Balance training, Stair training, Spinal manipulation, Spinal mobilization, Cognitive remediation, DME instructions, Cryotherapy, and Moist heat.  PLAN FOR NEXT SESSION:   - Wellzone visit/orientation then discharge after 12th visit  if still feeling comfortable continuing on his own.   Norman Sharps, PT, DPT Physical Therapist - [facility]  2:10 PM 11/09/24   "

## 2024-11-10 ENCOUNTER — Other Ambulatory Visit (INDEPENDENT_AMBULATORY_CARE_PROVIDER_SITE_OTHER)

## 2024-11-10 DIAGNOSIS — E785 Hyperlipidemia, unspecified: Secondary | ICD-10-CM | POA: Diagnosis not present

## 2024-11-10 DIAGNOSIS — G629 Polyneuropathy, unspecified: Secondary | ICD-10-CM

## 2024-11-10 LAB — LIPID PANEL
Cholesterol: 115 mg/dL (ref 28–200)
HDL: 30 mg/dL — ABNORMAL LOW
LDL Cholesterol: 62 mg/dL (ref 10–99)
NonHDL: 84.61
Total CHOL/HDL Ratio: 4
Triglycerides: 113 mg/dL (ref 10.0–149.0)
VLDL: 22.6 mg/dL (ref 0.0–40.0)

## 2024-11-14 ENCOUNTER — Ambulatory Visit: Admitting: Physical Therapy

## 2024-11-15 LAB — VITAMIN B6: Vitamin B6: 19 ng/mL (ref 2.1–21.7)

## 2024-11-16 ENCOUNTER — Encounter: Payer: Self-pay | Admitting: Oncology

## 2024-11-16 ENCOUNTER — Ambulatory Visit: Admitting: Physical Therapy

## 2024-11-17 ENCOUNTER — Ambulatory Visit (INDEPENDENT_AMBULATORY_CARE_PROVIDER_SITE_OTHER)

## 2024-11-17 ENCOUNTER — Ambulatory Visit: Admitting: Physical Therapy

## 2024-11-17 VITALS — BP 110/60 | HR 74 | Temp 98.1°F | Ht 70.0 in | Wt 179.0 lb

## 2024-11-17 DIAGNOSIS — N1832 Chronic kidney disease, stage 3b: Secondary | ICD-10-CM | POA: Diagnosis not present

## 2024-11-17 DIAGNOSIS — K219 Gastro-esophageal reflux disease without esophagitis: Secondary | ICD-10-CM

## 2024-11-17 DIAGNOSIS — Z0000 Encounter for general adult medical examination without abnormal findings: Secondary | ICD-10-CM | POA: Diagnosis not present

## 2024-11-17 DIAGNOSIS — E611 Iron deficiency: Secondary | ICD-10-CM

## 2024-11-17 MED ORDER — IRON (FERROUS SULFATE) 325 (65 FE) MG PO TABS: 325.0000 mg | ORAL_TABLET | ORAL | 3 refills | Status: AC

## 2024-11-17 NOTE — Patient Instructions (Signed)
 Thank you for visiting [facility] today! Here's what we talked about: - START iron  supplement 3 times a week  - Get covid, Tetanus vaccines from pharmacy -Bring a copy of your living will and power of attorney

## 2024-11-17 NOTE — Progress Notes (Signed)
 Sees dentist regularly. Needs tetanus vaccine at pharmacy. Up to date on pneumonia and flu vaccines.

## 2024-11-17 NOTE — Progress Notes (Signed)
 "  Assessment & Plan:   This visit was conducted in person. The patient gave informed consent to the use of [employer] AI technology to record the contents of the encounter as documented below.  Assessment & Plan Adult Wellness Visit Routine wellness visit. Vaccinations needed. Engages in healthy lifestyle.  I have personally reviewed and have noted: 1. The patient's medical and social history 2. Their use of alcohol, tobacco or illicit drugs 3. Their current medications and supplements 4. The patient's functional ability including ADL's, fall risks, home safety risks and hearing or visual impairment. 5. Diet and physical activities 6. Evidence for depression or mood disorder  Colonoscopy: In 2022 showed precancerous polyps, no repeat recommended due to age. No bloody Bms or significant change in Bms, agreeable to dc Labs: UTD Immunizations: Covid and Td from pharmacy Diet and Exercise: Follows renal diet, trying to increase greens, joined silver sneakers gym plan to go 3 times a week Sleep and mood: Okay Dental and vision: UTD ADLs and IADLs: Capable Cognitive: Intact to orientation, naming, recall and repetition Fall risk and home safety: No concerns Health counselling given: Exercise to improve balance and reduce fall risk  Advanced Directives Patient does have advanced directives including living will and healthcare power of attorney. He does not have a copy in the electronic medical record.   - Obtain TDAP and COVID-2023 vaccines at the pharmacy. - Continue regular exercise and renal diet. - Increase intake of green vegetables for iron . - Continue follow-up with Silver Sneakers program for back pain management.  CKD Follows with nephro, recently started on Retacrit  for low hemoglobin, most recently 8.6. - Continue current medication regimen as per nephrology and as per reviewed med list  Iron  deficiency Borderline iron  deficiency with low transferrin saturation.  Likely  sequela of his CKD.  Normal ferritin. No bleeding symptoms - Started ferrous sulfate  3 times a week. - Increase dietary intake of iron -rich foods such as green vegetables.   GERD Symptoms are old, no changes to regimen - Continue Protonix  40 mg daily   Follow-up: Return in about 4 months (around 03/13/2025) for Diabetes with lab visit 1 day before, then 1 year from now for CPE.        Subjective:   Patient ID: Brandon Lewis, male    DOB: 1934-08-16  Age: 89 y.o. MRN: [id number]  Patient Care Team: Bennett Reuben POUR, MD as PCP - General (Family Medicine) Hester Wolm PARAS, MD as PCP - Cardiology (Cardiology) Carolee Manus DASEN., MD (Ophthalmology)   CC:  Chief Complaint  Patient presents with   Annual Exam    Medicare Wellness due in July.  Discuss labs    CEJAY Lewis is a 89 y.o. male here for annual physical and f/u on chronic conditions, see assessment and plan for further details   Discussed the use of AI scribe software for clinical note transcription with the patient, who gave verbal consent to proceed.  History of Present Illness Brandon Lewis is an 89 year old male who presents for an annual physical exam and to discuss lab results.  He has chronic kidney disease and anemia, receiving Retacrit  injections due to hemoglobin levels less than ten. Despite the anemia, he feels well but notes possible increased fatigue. Recent labs show a slight worsening of anemia with macrocytic red blood cells, though vitamin B12 and folic acid  levels are normal. Iron  saturation is borderline low.  He underwent a colonoscopy in 2022 that revealed precancerous polyps.  No blood in stools or significant changes in bowel habits, attributing minor changes to a recent dietary adjustment for his kidney condition.  He follows a renal diet with low potassium intake, and his potassium levels remain stable. He has increased his intake of green vegetables to improve iron   levels.  He experiences throat congestion, which he attributes to mucus, and occasionally has bloody sinuses, likely due to aspirin  and Plavix  use.  He exercises regularly, attending Silver Sneakers sessions at a gym, and has completed physical therapy for a pinched nerve in his back, which has been beneficial.  He reports good sleep, only interrupted by urination, and maintains a positive mood. No falls in the past three months.  He is a caregiver for his wife, who has had a stroke, and he has help with her care. He has a living will and his son, Brandon Lewis, is his power of attorney.    Depression Screening;    11/17/2024    2:08 PM 09/13/2024    1:52 PM 09/08/2024    1:31 PM 05/24/2024    2:06 PM 05/03/2024    2:22 PM 04/21/2024    1:07 PM 04/07/2024   12:27 PM  PHQ 2/9 Scores  PHQ - 2 Score 0 0 0 0 0 0 0  PHQ- 9 Score      0       Data saved with a previous flowsheet row definition     Anxiety Screening:    03/06/2023   12:59 PM  GAD 7 : Generalized Anxiety Score  Nervous, Anxious, on Edge 0   Control/stop worrying 0   Worry too much - different things 0   Trouble relaxing 0   Restless 0   Easily annoyed or irritable 0   Afraid - awful might happen 0   Total GAD 7 Score 0  Anxiety Difficulty Not difficult at all     Data saved with a previous flowsheet row definition     ROS: Negative unless specifically indicated above in HPI.       Objective:     BP 110/60 (BP Location: Right Arm, Patient Position: Sitting, Cuff Size: Large)   Pulse 74   Temp 98.1 F (36.7 C) (Oral)   Ht 5' 10 (1.778 m)   Wt 179 lb (81.2 kg)   SpO2 99%   BMI 25.68 kg/m    Physical Exam GENERAL: Alert, cooperative, well developed, no acute distress. HEAD: Normocephalic atraumatic, oral cavity normal, tongue without lesions. EYES: Extraocular movements intact BL, pupils round, equal and reactive to light BL, conjunctivae normal BL. EARS: Tympanic membrane, ear canal and external ear normal  BL. NOSE: No congestion or rhinorrhea, mucous membranes are moist. THROAT: No oropharyngeal exudate or posterior oropharyngeal erythema. CARDIOVASCULAR: Normal heart rate and rhythm, S1 and S2 normal without murmurs. CHEST: Clear to auscultation bilaterally, no wheezes, rhonchi, or crackles. ABDOMEN: Soft, non tender, non distended, without organomegaly, normal bowel sounds. EXTREMITIES: No cyanosis or edema. NEUROLOGICAL: Oriented to person, place and time, no gait abnormalities, moves all extremities without gross motor or sensory deficit. NECK: Supple, non-tender.        Tiaja Hagan K Caraline Deutschman, MD  11/17/24    Contains text generated by [employer].    "

## 2024-11-21 ENCOUNTER — Ambulatory Visit: Admitting: Physical Therapy

## 2024-11-23 ENCOUNTER — Ambulatory Visit: Admitting: Physical Therapy

## 2024-11-24 ENCOUNTER — Ambulatory Visit: Attending: Physical Medicine & Rehabilitation | Admitting: Physical Therapy

## 2024-11-24 DIAGNOSIS — M545 Low back pain, unspecified: Secondary | ICD-10-CM

## 2024-11-24 DIAGNOSIS — R262 Difficulty in walking, not elsewhere classified: Secondary | ICD-10-CM

## 2024-11-24 NOTE — Therapy (Signed)
 " OUTPATIENT PHYSICAL THERAPY TREATMENT Discharge Summary    Patient Name: Brandon Lewis MRN: [id number] DOB:[birth year], 89 y.o., male Today's Date: 11/24/2024  END OF SESSION:  PT End of Session - 11/24/24 1314     Visit Number 12    Number of Visits 12    Date for Recertification  11/24/24    Authorization Type [employer]; through 11/07/24    Authorization Time Period 12 visits between 12/8-12/25/24    Progress Note Due on Visit 10    PT Start Time 1315    PT Stop Time 1355    PT Time Calculation (min) 40 min    Activity Tolerance Patient tolerated treatment well;No increased pain    Behavior During Therapy WFL for tasks assessed/performed           Past Medical History:  Diagnosis Date   Allergy side effects- NKDA   Anemia    Aortic atherosclerosis    Atrial flutter, paroxysmal (HCC)    a.) postoperatively following CABG   Bladder cancer (HCC) 04/2020   BPH (benign prostatic hyperplasia)    CAD (coronary artery disease) 09/12/2019   a.) LHC 09/12/2019: EF 55-60%; 85% m-dLM, 50% pLCx, 85% p-m LAD, 70% pLAD, 45% mLAD, 70% p-mRCA. b.) 4v CABG 09/16/2019.   CKD (chronic kidney disease), stage III (HCC)    Diabetes mellitus without complication (HCC)    Diverticulosis    GERD (gastroesophageal reflux disease)    History of kidney stones    HTN (hypertension)    Hyperlipidemia    Long term current use of antithrombotics/antiplatelets    a.) DAPT therapies (ASA + clopidogrel )   NSTEMI (non-ST elevated myocardial infarction) (HCC) 09/11/2019   a.) LHC 09/12/2019: 85% m-dLM, 50% pLCx, 85% p-m LAD, 70% pLAD, 45% mLAD, 70% p-mRCA; transferred to [facility] for CVTS consult. Underwent 4v CABG on 09/16/2019.   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    a.) 4v CABG: LIMA-LAD, SVG-RCA, SVG-OM1, SVG-D1   Small bowel obstruction (HCC)    after surgeries   Thoracic ascending aortic aneurysm    a.) measured 4.1 cm by CT on 09/14/2019   Past Surgical History:  Procedure Laterality Date   aorto-bifem  2003   [facility]    APPENDECTOMY     CAROTID STENT     [facility]   CATARACT EXTRACTION, BILATERAL  2017   CHOLECYSTECTOMY  2002   COLONOSCOPY     COLONOSCOPY WITH PROPOFOL  N/A 07/17/2021   Procedure: COLONOSCOPY WITH PROPOFOL ;  Surgeon: Therisa Bi, MD;  Location: [facility] ENDOSCOPY;  Service: Gastroenterology;  Laterality: N/A;   CORONARY ARTERY BYPASS GRAFT N/A 09/16/2019   Procedure: CORONARY ARTERY BYPASS GRAFTING (CABG) using LIMA to LAD; Endoscopic right saphenous vein harvest to OM1, Diag1, and RCA.;  Surgeon: German Bartlett PEDLAR, MD;  Location: [facility] OR;  Service: Open Heart Surgery;  Laterality: N/A;   CYSTOSCOPY WITH BIOPSY N/A 10/08/2021   Procedure: CYSTOSCOPY WITH BLADDER BIOPSY;  Surgeon: Twylla Glendia BROCKS, MD;  Location: [facility] ORS;  Service: Urology;  Laterality: N/A;   ENDARTERECTOMY FEMORAL Left 03/13/2021   Procedure: ENDARTERECTOMY FEMORAL (GROIN REVISION);  Surgeon: Jama Cordella MATSU, MD;  Location: [facility] ORS;  Service: Vascular;  Laterality: Left;   EYE SURGERY  Cataracts removed ou   LEFT HEART  CATH AND CORONARY ANGIOGRAPHY N/A 09/12/2019   Procedure: LEFT HEART CATH AND CORONARY ANGIOGRAPHY;  Surgeon: Hester Wolm PARAS, MD;  Location: [facility] INVASIVE CV LAB;  Service: Cardiovascular;  Laterality: N/A;   LITHOTRIPSY     LOWER EXTREMITY ANGIOGRAPHY Left 01/15/2021   Procedure: LOWER EXTREMITY ANGIOGRAPHY;  Surgeon: Jama Cordella MATSU, MD;  Location: [facility] INVASIVE CV LAB;  Service: Cardiovascular;  Laterality: Left;   SPINE SURGERY  1954   TEE WITHOUT CARDIOVERSION N/A 09/16/2019   Procedure: TRANSESOPHAGEAL ECHOCARDIOGRAM (TEE);  Surgeon: German Bartlett PEDLAR, MD;  Location: [facility] OR;  Service: Open Heart Surgery;  Laterality: N/A;   TONSILLECTOMY      TRANSURETHRAL RESECTION OF BLADDER TUMOR  10/08/2021   Procedure: TRANSURETHRAL RESECTION OF BLADDER TUMOR (TURBT);  Surgeon: Twylla Glendia BROCKS, MD;  Location: [facility] ORS;  Service: Urology;;   TRANSURETHRAL RESECTION OF BLADDER TUMOR WITH MITOMYCIN -C N/A 07/24/2020   Procedure: TRANSURETHRAL RESECTION OF BLADDER TUMOR WITH Gemcitabine ;  Surgeon: Twylla Glendia BROCKS, MD;  Location: [facility] ORS;  Service: Urology;  Laterality: N/A;   VASECTOMY  1976   WISDOM TOOTH EXTRACTION     Patient Active Problem List   Diagnosis Date Noted   Anemia, chronic renal failure 11/02/2024   Type 2 diabetes mellitus with diabetic nephropathy, without long-term current use of insulin  (HCC) 09/13/2024   Lumbar radiculopathy 04/07/2024   Bladder cancer (HCC) 05/22/2021   Anemia, chronic disease 05/22/2021   Aortic atherosclerosis 05/17/2020   BPH (benign prostatic hyperplasia) 11/16/2019   S/P CABG x 4 11/04/2019   Atrial flutter (HCC) 11/04/2019   Allergic rhinitis 09/12/2019   Coronary artery disease due to lipid rich plaque    Stage 3b chronic kidney disease (HCC) 09/14/2018   BCC (basal cell carcinoma), face 04/12/2018   Actinic keratosis 08/14/2015   Advanced directives, counseling/discussion 07/25/2014   Hyperlipemia 03/02/2009   Essential hypertension, benign 09/22/2007   PAD (peripheral artery disease) 09/22/2007   Osteoarthritis, generalized 09/22/2007    PCP: Bennett Reuben POUR, MD  REFERRING PROVIDER: Carilyn [name] BRAVO, MD   REFERRING DIAG: [phone] (ICD-10-CM) - Lumbosacral spondylosis without myelopathy   Rationale for Evaluation and Treatment: Rehabilitation  THERAPY DIAG:  Difficulty in walking, not elsewhere classified  Chronic right-sided low back pain without sciatica  ONSET DATE:   SUBJECTIVE:                                                                                                                                                                                            SUBJECTIVE STATEMENT: Patient reports doing well today with no significant changes. States that he is feeling about the same as  he has in the past few weeks, which is better than he was doing a few months ago. Feels ready to d/c from PT.   PERTINENT HISTORY:  89yoM referred to OPPT for acute on chronic exacerbation of Rt low back pain. Pt has pain in the right hip near the Rt posterolateral belt loop. Pt denies any migration of symptoms, although they fluctuate in intensity, certainly worse when standing or walking. Pt reports pain as achy. Pt reports >5 years history without any specific inciting event. Pt has chronic AMB limitations due to claudication of the legs. Pt AMB without device at baseline, reports no falls, no balance issues. Recent FU with vascular surgery in December shows no new blockage issues.   PAIN:  Are you having pain? ~1/10 today   PRECAUTIONS: None  WEIGHT BEARING RESTRICTIONS: No  FALLS:  Has patient fallen in last 6 months?   OCCUPATION: Retired teacher, early years/pre   PLOF: Independent   PATIENT GOALS: Have less pain   OBJECTIVE:  Note: Objective measures were completed at Evaluation unless otherwise noted.  DIAGNOSTIC FINDINGS:  None  PATIENT SURVEYS:  None  HIP ROM:   ROM Assessment Hip 09/26/24     Right  Left   Flexion Supine 120 125  External rotation (90/90) 42 45  Internal Rotation (90/90) 22 18  Supine hip ABDCT  25 38     PALPATION:  Pt has sharp pain upon palpations of gluteus medius in 3 anatomical regions near the iliac crest, and Rt anterior gluteus minimus.   GAIT: Distance walked: 182ft Assistive device utilized: none Level of assistance: Modified independent  Comments: Pain progression begins after 164ft   TREATMENT DATE 11/24/24:                                                                                                                            Goal assessment for d/c. See goals.   HEP expansion.   - Sit to Stand with  Arms Crossed  - 1 x daily - 3 x weekly - 3 sets - 7 reps - Side Stepping with Resistance at Ankles and Counter Support  - 1 x daily - 7 x weekly - 3 sets - 10 reps - Standing 3-Way Leg Reach with Resistance at Ankles and Counter Support  - 1 x daily - 7 x weekly - 3 sets - 10 reps - Standing March with Counter Support  - 1 x daily - 7 x weekly - 3 sets - 10 reps  PATIENT EDUCATION:  Education details: Relayed response to session here with findings from physiatry note this week.  Person educated: Patient Education method: Chief Technology Officer Education comprehension: Good   HOME EXERCISE PROGRAM: Access Code: [email or web address] URL: [email or web address] Date: 11/24/2024 Prepared by: Massie Dollar  Exercises - Supine Gluteal Sets  - 3 x weekly - 3 sets - 10 reps - Supine Bridge  - 3 x weekly - 3 sets - 10 reps - Supine Bridge with  Mini Swiss Ball Between Knees  - 3 x weekly - 3 sets - 10 reps - Clamshell  - 1 x daily - 3 x weekly - 3 sets - 10 reps - Sidelying Hip Abduction  - 1 x daily - 3 x weekly - 3 sets - 10 reps - Sit to Stand with Arms Crossed  - 1 x daily - 3 x weekly - 3 sets - 7 reps - Side Stepping with Resistance at Ankles and Counter Support  - 1 x daily - 7 x weekly - 3 sets - 10 reps - Standing 3-Way Leg Reach with Resistance at Ankles and Counter Support  - 1 x daily - 7 x weekly - 3 sets - 10 reps - Standing March with Counter Support  - 1 x daily - 7 x weekly - 3 sets - 10 reps  ASSESSMENT:  CLINICAL IMPRESSION: -Today's treatment focused on  discharge assessment and HEp expansion. Pt reports that he is feeling much better than when he started and rates LEFS 55%. States that he feels mor confident with well zone equipment and understands that staff can provide orientation once membership begins. Due to reduced pain, improved self reported mobility, and desire to transition to community fitness center, skilled PT will no longer be required at this time.      OBJECTIVE IMPAIRMENTS: Decreased knowledge of condition, decreased use of DME, decreased mobility, difficulty walking, decreased strength, decreased ROM. ACTIVITY LIMITATIONS: Lifting, standing, walking, squatting, transfers, locomotion level PARTICIPATION LIMITATIONS: Cleaning, laundry, interpersonal relationships, driving, yardwork, community activity.  PERSONAL FACTORS: Age, behavior pattern, education, past/current experiences, transportation, profession  are also affecting patient's functional outcome.  REHAB POTENTIAL: Good CLINICAL DECISION MAKING: Medium  EVALUATION COMPLEXITY: Moderate    GOALS: Goals reviewed with patient? No  SHORT TERM GOALS: Target date: 10/19/24  Pt to show >15% improvement on LEFS.  Baseline:  2/5: 44/80 (55%) Goal status: ON GOING  2.  Pt to demonstrate ability to AMB >358ft without increase in pain symptoms.  Baseline:  11/07/2024: Pain goes up a little, but no longer intolerable. 2/5: pt reports slight increase in pain following 319ft.  Goal status: Ongoing.  3.  Pt reports compliance with home and/or gym program for BLE strengthening and ROM.  Baseline: wants to joint [facility] with silver sneakers  Goal status: MET, with transition to gym in process   LONG TERM GOALS: Target date: 11/24/24  Pt to report no increase in Rt hip pain >3/10 over most recent 7 days.  Baseline: 10/19/24: at worst between 3-4/10 in past week.  11/07/2024: 2/10 2/5: 2-3/10 at worst.  Goal status: MET  2.  Pt to report confidence in DC level HEP for continued hip strength and tissue maintenance.  Baseline:  11/07/2024: Not confident with machines in [place]. Wants orientation.  2/5: feels more comfortable with machines following last PT visit, and understands that well zone team can instruct him in safe use of equipment upon transition  Goal status: MET   PLAN:  PT FREQUENCY: 1-2x/week  PT DURATION: 6 weeks  PLANNED INTERVENTIONS: 97110-Therapeutic  exercises, 97530- Therapeutic activity, 97112- Neuromuscular re-education, 97535- Self Care, [id number]- Manual therapy, [id number]- Gait training, [phone]- Orthotic Initial, [phone]- Orthotic/Prosthetic subsequent, [id number]- Electrical stimulation (unattended), [phone]- Electrical stimulation (manual), [id number]- Traction (mechanical), [id number]- Ionotophoresis 4mg /ml Dexamethasone , Patient/Family education, Balance training, Stair training, Spinal manipulation, Spinal mobilization, Cognitive remediation, DME instructions, Cryotherapy, and Moist heat.  PLAN FOR NEXT SESSION:  N/a DC from PT   Norman Sharps, PT, DPT  Physical Therapist - [facility]  1:58 PM 11/24/24   "

## 2024-11-28 ENCOUNTER — Ambulatory Visit: Admitting: Physical Therapy

## 2024-11-30 ENCOUNTER — Ambulatory Visit: Admitting: Physical Therapy

## 2024-12-02 ENCOUNTER — Encounter: Admitting: Physical Medicine & Rehabilitation

## 2024-12-05 ENCOUNTER — Ambulatory Visit: Admitting: Physical Therapy

## 2024-12-07 ENCOUNTER — Ambulatory Visit: Admitting: Physical Therapy

## 2024-12-12 ENCOUNTER — Ambulatory Visit: Admitting: Physical Therapy

## 2024-12-13 ENCOUNTER — Inpatient Hospital Stay

## 2024-12-13 ENCOUNTER — Inpatient Hospital Stay: Admitting: Oncology

## 2024-12-14 ENCOUNTER — Ambulatory Visit: Admitting: Physical Therapy

## 2024-12-19 ENCOUNTER — Ambulatory Visit: Admitting: Physical Therapy

## 2024-12-21 ENCOUNTER — Ambulatory Visit: Admitting: Physical Therapy

## 2024-12-26 ENCOUNTER — Ambulatory Visit: Admitting: Physical Therapy

## 2024-12-28 ENCOUNTER — Ambulatory Visit: Admitting: Physical Therapy

## 2025-01-02 ENCOUNTER — Ambulatory Visit: Admitting: Physical Therapy

## 2025-01-04 ENCOUNTER — Ambulatory Visit: Admitting: Physical Therapy

## 2025-02-17 ENCOUNTER — Other Ambulatory Visit: Admitting: Urology

## 2025-02-20 ENCOUNTER — Inpatient Hospital Stay

## 2025-02-20 ENCOUNTER — Inpatient Hospital Stay: Admitting: Nurse Practitioner

## 2025-02-27 ENCOUNTER — Inpatient Hospital Stay: Admitting: Oncology

## 2025-03-15 ENCOUNTER — Other Ambulatory Visit

## 2025-03-16 ENCOUNTER — Ambulatory Visit

## 2025-04-17 ENCOUNTER — Encounter (INDEPENDENT_AMBULATORY_CARE_PROVIDER_SITE_OTHER)

## 2025-04-17 ENCOUNTER — Ambulatory Visit (INDEPENDENT_AMBULATORY_CARE_PROVIDER_SITE_OTHER): Admitting: Vascular Surgery

## 2025-05-04 ENCOUNTER — Ambulatory Visit

## 2025-05-05 ENCOUNTER — Ambulatory Visit
